# Patient Record
Sex: Male | Born: 1975
Health system: Southern US, Community
[De-identification: ages and names within clinical notes are randomized; demographics above are authoritative.]

## PROBLEM LIST (undated history)

## (undated) ENCOUNTER — Emergency Department (HOSPITAL_COMMUNITY): Discharge status: Absent without leave | Payer: PRIVATE HEALTH INSURANCE

## (undated) ENCOUNTER — Ambulatory Visit (HOSPITAL_COMMUNITY): Discharge status: Absent without leave | Payer: 59

## (undated) ENCOUNTER — Emergency Department (HOSPITAL_COMMUNITY): Admission: EM | Payer: PRIVATE HEALTH INSURANCE

## (undated) VITALS — BP 131/83 | Temp 98.5°F | Resp 20

## (undated) DIAGNOSIS — L309 Dermatitis, unspecified: Secondary | ICD-10-CM

## (undated) DIAGNOSIS — I10 Essential (primary) hypertension: Secondary | ICD-10-CM

## (undated) DIAGNOSIS — C801 Malignant (primary) neoplasm, unspecified: Secondary | ICD-10-CM

## (undated) DIAGNOSIS — K5790 Diverticulosis of intestine, part unspecified, without perforation or abscess without bleeding: Secondary | ICD-10-CM

## (undated) DIAGNOSIS — J069 Acute upper respiratory infection, unspecified: Secondary | ICD-10-CM

## (undated) DIAGNOSIS — N200 Calculus of kidney: Secondary | ICD-10-CM

## (undated) DIAGNOSIS — E119 Type 2 diabetes mellitus without complications: Secondary | ICD-10-CM

## (undated) DIAGNOSIS — F17200 Nicotine dependence, unspecified, uncomplicated: Secondary | ICD-10-CM

## (undated) DIAGNOSIS — K219 Gastro-esophageal reflux disease without esophagitis: Secondary | ICD-10-CM

## (undated) DIAGNOSIS — E118 Type 2 diabetes mellitus with unspecified complications: Secondary | ICD-10-CM

## (undated) DIAGNOSIS — D759 Disease of blood and blood-forming organs, unspecified: Secondary | ICD-10-CM

## (undated) DIAGNOSIS — E782 Mixed hyperlipidemia: Principal | ICD-10-CM

## (undated) HISTORY — DX: Nicotine dependence, unspecified, uncomplicated: F17.200

## (undated) HISTORY — DX: Dermatitis, unspecified: L30.9

## (undated) HISTORY — PX: UPPER GASTROINTESTINAL ENDOSCOPY: SHX188

## (undated) HISTORY — DX: Acute upper respiratory infection, unspecified: J06.9

## (undated) HISTORY — DX: Calculus of kidney: N20.0

## (undated) HISTORY — DX: Mixed hyperlipidemia: E78.2

## (undated) HISTORY — DX: Type 2 diabetes mellitus with unspecified complications: E11.8

## (undated) HISTORY — PX: GANGLION CYST EXCISION: SHX1691

## (undated) HISTORY — PX: ELBOW SURGERY: SHX618

## (undated) HISTORY — PX: COLONOSCOPY: SHX174

---

## 2015-11-08 LAB — HM COLONOSCOPY

## 2016-10-05 ENCOUNTER — Emergency Department (HOSPITAL_COMMUNITY): Payer: BLUE CROSS/BLUE SHIELD

## 2016-10-05 ENCOUNTER — Emergency Department (HOSPITAL_COMMUNITY)
Admission: EM | Admit: 2016-10-05 | Discharge: 2016-10-05 | Disposition: A | Discharge status: Home / Self Care | Payer: BLUE CROSS/BLUE SHIELD | Attending: Emergency Medicine | Admitting: Emergency Medicine

## 2016-10-05 ENCOUNTER — Encounter (HOSPITAL_COMMUNITY): Payer: Self-pay

## 2016-10-05 DIAGNOSIS — I10 Essential (primary) hypertension: Secondary | ICD-10-CM | POA: Diagnosis not present

## 2016-10-05 DIAGNOSIS — E119 Type 2 diabetes mellitus without complications: Secondary | ICD-10-CM | POA: Insufficient documentation

## 2016-10-05 DIAGNOSIS — F17210 Nicotine dependence, cigarettes, uncomplicated: Secondary | ICD-10-CM | POA: Insufficient documentation

## 2016-10-05 DIAGNOSIS — R1013 Epigastric pain: Secondary | ICD-10-CM | POA: Diagnosis present

## 2016-10-05 HISTORY — DX: Type 2 diabetes mellitus without complications: E11.9

## 2016-10-05 HISTORY — DX: Essential (primary) hypertension: I10

## 2016-10-05 LAB — COMPREHENSIVE METABOLIC PANEL
ALBUMIN: 4 g/dL (ref 3.5–5.0)
ALT: 43 U/L (ref 17–63)
ANION GAP: 10 (ref 5–15)
AST: 26 U/L (ref 15–41)
Alkaline Phosphatase: 97 U/L (ref 38–126)
BILIRUBIN TOTAL: 1.1 mg/dL (ref 0.3–1.2)
BUN: 10 mg/dL (ref 6–20)
CHLORIDE: 103 mmol/L (ref 101–111)
CO2: 25 mmol/L (ref 22–32)
Calcium: 8.9 mg/dL (ref 8.9–10.3)
Creatinine, Ser: 0.97 mg/dL (ref 0.61–1.24)
GFR calc Af Amer: >60 mL/min (ref >60)
Glucose, Bld: 186 mg/dL — ABNORMAL HIGH (ref 65–99)
Potassium: 2.9 mmol/L — ABNORMAL LOW (ref 3.5–5.1)
Sodium: 138 mmol/L (ref 135–145)
TOTAL PROTEIN: 6.9 g/dL (ref 6.5–8.1)

## 2016-10-05 LAB — CBC
HEMATOCRIT: 46.1 % (ref 39.0–52.0)
HEMOGLOBIN: 16.3 g/dL (ref 13.0–17.0)
MCH: 32.7 pg (ref 26.0–34.0)
MCHC: 35.4 g/dL (ref 30.0–36.0)
MCV: 92.6 fL (ref 78.0–100.0)
Platelets: 191 10*3/uL (ref 150–400)
RBC: 4.98 MIL/uL (ref 4.22–5.81)
RDW: 13 % (ref 11.5–15.5)
WBC: 10.4 10*3/uL (ref 4.0–10.5)

## 2016-10-05 LAB — LIPASE, BLOOD: LIPASE: 60 U/L — AB (ref 11–51)

## 2016-10-05 LAB — I-STAT TROPONIN, ED: Troponin i, poc: 0.01 ng/mL (ref 0.00–0.08)

## 2016-10-05 MED ORDER — RANITIDINE HCL 150 MG PO TABS: 150.0000 mg | ORAL_TABLET | Freq: Two times a day (BID) | ORAL | 0 refills | Status: DC

## 2016-10-05 NOTE — ED Notes (Signed)
Pt A&OX4, ambulatory at d/c with independent gait, NAD. This RN also apologized again for his long wait. Pt verbalized understanding of d/c instructions and follow up care.This RN offered to print patient a resource guide for local primary care providers within the community. Pt declined.

## 2016-10-05 NOTE — ED Notes (Signed)
Pt verbalized he was upset that he had to wait in waiting room for 4 hours. This RN apologized for his long wait but explained to patient the process of acuity and that people are taken back to rooms based on acuity, etc. Pt stated he would like to just leave because his pain is gone and that "ill just go to another ER if it comes back." Pt encouraged to stay to see the EDP but he states he does not want to wait "another 2 hours." Pt then wanted a work note but this RN explained to patient this RN would not provide him with a work note without being seen and cleared by a provider. Pt stated he would tjhen wait to see EDP. This RN went directly to Dr. Lacinda Axon to update him and Dr. Lacinda Axon went directly into patients room to talk to patient.

## 2016-10-05 NOTE — ED Triage Notes (Signed)
Pt states he has had epigastric/chest pain for several months. He describes the area of pain directly below the xyphoid process. Pt denies an associated symptoms. Pt states he has had a colonoscopy due to the pain with no answers. Skin warm and dry, no acute distress noted.

## 2016-10-05 NOTE — Discharge Instructions (Signed)
Recommend discontinuation of alcohol. Try new medication. You'll need to get a primary care doctor in [place].

## 2016-10-05 NOTE — ED Provider Notes (Signed)
[facility] DEPT Provider Note   CSN: [id number] Arrival date & time: 10/05/16  1309     History   Chief Complaint Chief Complaint  Patient presents with  . Abdominal Pain    HPI Eric Hooper is a 41 y.o. male.  Patient presents with epigastric pain for the last 8 months. He claims to have had a normal EGD and colonoscopy recently. No fever, sweats, chills, vomiting, diarrhea, chest pain, dyspnea, jaundice, weight loss seen. Past medical history includes diabetes and hypertension. Pain is mild and not associated with any activity.      Past Medical History:  Diagnosis Date  . Diabetes mellitus without complication (HCC)    non-insulin dependent  . Hypertension     There are no active problems to display for this patient.   History reviewed. No pertinent surgical history.     Home Medications    Prior to Admission medications   Medication Sig Start Date End Date Taking? Authorizing Provider  ranitidine (ZANTAC) 150 MG tablet Take 1 tablet (150 mg total) by mouth 2 (two) times daily. 10/05/16   Nat Christen, MD    Family History History reviewed. No pertinent family history.  Social History Social History  Substance Use Topics  . Smoking status: Current Every Day Smoker    Packs/day: 0.50    Types: Cigarettes  . Smokeless tobacco: Never Used  . Alcohol use Yes     Comment: socially     Allergies   Patient has no known allergies.   Review of Systems Review of Systems  All other systems reviewed and are negative.    Physical Exam Updated Vital Signs BP (!) 126/91 (BP Location: Left Arm)   Pulse 83   Temp 97.9 F (36.6 C) (Oral)   Resp 16   Ht 5\' 10"  (1.778 m)   Wt 79.4 kg (175 lb)   SpO2 97%   BMI 25.11 kg/m   Physical Exam  Constitutional: He is oriented to person, place, and time. He appears well-developed and well-nourished.  NAD  HENT:  Head: Normocephalic and atraumatic.  Eyes: Conjunctivae are normal.  Neck: Neck supple.    Cardiovascular: Normal rate and regular rhythm.   Pulmonary/Chest: Effort normal and breath sounds normal.  Abdominal: Soft. Bowel sounds are normal.  Musculoskeletal: Normal range of motion.  Neurological: He is alert and oriented to person, place, and time.  Skin: Skin is warm and dry.  Psychiatric: He has a normal mood and affect. His behavior is normal.  Nursing note and vitals reviewed.    ED Treatments / Results  Labs (all labs ordered are listed, but only abnormal results are displayed) Labs Reviewed  LIPASE, BLOOD - Abnormal; Notable for the following:       Result Value   Lipase 60 (*)    All other components within normal limits  COMPREHENSIVE METABOLIC PANEL - Abnormal; Notable for the following:    Potassium 2.9 (*)    Glucose, Bld 186 (*)    All other components within normal limits  CBC  URINALYSIS, ROUTINE W REFLEX MICROSCOPIC  I-STAT TROPOININ, ED    EKG  EKG Interpretation  Date/Time:  Monday October 05 2016 13:44:58 EDT Ventricular Rate:  72 PR Interval:  132 QRS Duration: 104 QT Interval:  426 QTC Calculation: 466 R Axis:   89 Text Interpretation:  Normal sinus rhythm T wave abnormality, consider inferior ischemia Prolonged QT Abnormal ECG No previous tracing Confirmed by Dorie Rank [id number]) on 10/05/2016 4:11:53 PM  Radiology Dg Chest 2 View  Result Date: 10/05/2016 CLINICAL DATA:  Chronic chest pain EXAM: CHEST  2 VIEW COMPARISON:  None. FINDINGS: Lungs are clear. Heart size and pulmonary vascularity are normal. No adenopathy. No pneumothorax. No bone lesions. IMPRESSION: No edema or consolidation. Electronically Signed   By: Lowella Grip III M.D.   On: 10/05/2016 15:02    Procedures Procedures (including critical care time)  Medications Ordered in ED Medications - No data to display   Initial Impression / Assessment and Plan / ED Course  I have reviewed the triage vital signs and the nursing notes.  Pertinent labs & imaging  results that were available during my care of the patient were reviewed by me and considered in my medical decision making (see chart for details).  History and physical suggestive of dyspepsia or GERD. Lipase minimally elevated. Potassium slightly low. Will prescribe Zantac 150 mg bid.  He will get primary care follow-up. May need gallbladder ultrasound if symptoms do not improve.    Final Clinical Impressions(s) / ED Diagnoses   Final diagnoses:  Epigastric pain    New Prescriptions New Prescriptions   RANITIDINE (ZANTAC) 150 MG TABLET    Take 1 tablet (150 mg total) by mouth 2 (two) times daily.     Nat Christen, MD 10/12/16 1124

## 2016-12-22 ENCOUNTER — Encounter (HOSPITAL_COMMUNITY): Payer: Self-pay | Admitting: Emergency Medicine

## 2016-12-22 ENCOUNTER — Emergency Department (HOSPITAL_COMMUNITY)
Admission: EM | Admit: 2016-12-22 | Discharge: 2016-12-22 | Disposition: A | Discharge status: Home / Self Care | Payer: BLUE CROSS/BLUE SHIELD | Attending: Emergency Medicine | Admitting: Emergency Medicine

## 2016-12-22 DIAGNOSIS — F17210 Nicotine dependence, cigarettes, uncomplicated: Secondary | ICD-10-CM | POA: Insufficient documentation

## 2016-12-22 DIAGNOSIS — E119 Type 2 diabetes mellitus without complications: Secondary | ICD-10-CM | POA: Diagnosis not present

## 2016-12-22 DIAGNOSIS — R1013 Epigastric pain: Secondary | ICD-10-CM | POA: Diagnosis present

## 2016-12-22 DIAGNOSIS — R062 Wheezing: Secondary | ICD-10-CM

## 2016-12-22 DIAGNOSIS — I10 Essential (primary) hypertension: Secondary | ICD-10-CM | POA: Insufficient documentation

## 2016-12-22 DIAGNOSIS — M545 Low back pain, unspecified: Secondary | ICD-10-CM

## 2016-12-22 DIAGNOSIS — K2950 Unspecified chronic gastritis without bleeding: Secondary | ICD-10-CM | POA: Diagnosis not present

## 2016-12-22 LAB — COMPREHENSIVE METABOLIC PANEL
ALT: 52 U/L (ref 17–63)
AST: 39 U/L (ref 15–41)
Albumin: 4.3 g/dL (ref 3.5–5.0)
Alkaline Phosphatase: 111 U/L (ref 38–126)
Anion gap: 9 (ref 5–15)
BUN: 9 mg/dL (ref 6–20)
CHLORIDE: 104 mmol/L (ref 101–111)
CO2: 27 mmol/L (ref 22–32)
CREATININE: 1.07 mg/dL (ref 0.61–1.24)
Calcium: 9.6 mg/dL (ref 8.9–10.3)
GFR calc Af Amer: >60 mL/min (ref >60)
Glucose, Bld: 134 mg/dL — ABNORMAL HIGH (ref 65–99)
Potassium: 3.3 mmol/L — ABNORMAL LOW (ref 3.5–5.1)
Sodium: 140 mmol/L (ref 135–145)
Total Bilirubin: 1.1 mg/dL (ref 0.3–1.2)
Total Protein: 7.2 g/dL (ref 6.5–8.1)

## 2016-12-22 LAB — CBC
HCT: 44.9 % (ref 39.0–52.0)
Hemoglobin: 15.7 g/dL (ref 13.0–17.0)
MCH: 32.9 pg (ref 26.0–34.0)
MCHC: 35 g/dL (ref 30.0–36.0)
MCV: 94.1 fL (ref 78.0–100.0)
Platelets: 143 10*3/uL — ABNORMAL LOW (ref 150–400)
RBC: 4.77 MIL/uL (ref 4.22–5.81)
RDW: 13.2 % (ref 11.5–15.5)
WBC: 9.5 10*3/uL (ref 4.0–10.5)

## 2016-12-22 LAB — URINALYSIS, ROUTINE W REFLEX MICROSCOPIC
Bilirubin Urine: NEGATIVE
Glucose, UA: NEGATIVE mg/dL
Hgb urine dipstick: NEGATIVE
Ketones, ur: NEGATIVE mg/dL
Leukocytes, UA: NEGATIVE
Nitrite: NEGATIVE
Protein, ur: 30 mg/dL — AB
SPECIFIC GRAVITY, URINE: 1.02 (ref 1.005–1.030)
pH: 5 (ref 5.0–8.0)

## 2016-12-22 LAB — LIPASE, BLOOD: LIPASE: 46 U/L (ref 11–51)

## 2016-12-22 MED ORDER — ESOMEPRAZOLE MAGNESIUM 40 MG PO CPDR: 40.0000 mg | DELAYED_RELEASE_CAPSULE | Freq: Every day | ORAL | 0 refills | Status: DC

## 2016-12-22 MED ORDER — FLUTICASONE PROPIONATE 50 MCG/ACT NA SUSP: 2.0000 | Freq: Every day | NASAL | 2 refills | Status: DC

## 2016-12-22 MED ORDER — ALBUTEROL SULFATE HFA 108 (90 BASE) MCG/ACT IN AERS
2.0000 | INHALATION_SPRAY | RESPIRATORY_TRACT | Status: DC
  Administered 2016-12-22: 2 via RESPIRATORY_TRACT
  Filled 2016-12-22: qty 6.7

## 2016-12-22 NOTE — ED Triage Notes (Signed)
Pt states he has been having abd pain for a year. Pt also complains of left flank pain today. Denies urinary symptoms. Pt states he also works around a lot of dust and has been coughing at times and feels like he wheezes sometimes. No CP or SOB at this time.

## 2016-12-22 NOTE — ED Provider Notes (Signed)
[facility] DEPT Provider Note   CSN: [id number] Arrival date & time: 12/22/16  1223     History   Chief Complaint Chief Complaint  Patient presents with  . Abdominal Pain  . Flank Pain  . Cough    HPI Eric Hooper is a 41 y.o. male.  HPI Eric Hooper is a 41 y.o. male with history of hypertension, diabetes, presents to emergency department with multiple complaints. Patient states that he is been having epigastric abdominal pain for almost a year. He states pain comes and goes. It is worse in the morning. He states at times it is so severe that he is doubled over and crying in pain. Sometimes it is mild. It is not associated with eating. It is not associated with exertion. He states that he was seen for the same in [place] he is from about a year ago and had an endoscopy and colonoscopy performed patient states that he was told everything was okay but was started on Nexium. He states he currently unable to afford Nexium. Patient also admits to a diet that is high in spicy and tomato based foods. He states "I married to a [place], what do you think." Patient also reports drinking alcohol daily, he states he drinks 2-3 beers every night. He takes ibuprofen daily for his back and abdominal pain. He smokes cigarettes. Drinks coffee in the morning. Denies any change in his stools. Denies any black or tarry stools. Denies any nausea or vomiting. Patient's other complaint is wheezing. He states he works around would dust and states he is allergic to trees. He reports postnasal drainage, constant nasal congestion, wheezing which is worse after work. He states he wears a mask at work "sometimes." He has not tried any medications for his wheezing. He denies any fever or chills. He denies any chest pain. He denies any productive sputum. His last complaint is lower back pain. He states he does a lot of heavy lifting and in the last several days noticed pain in the left lower back when  lifting heavy objects. Pain does not radiate. Pain is worsened with movements. States "I just want to make sure my kidneys okay." He denies any urinary symptoms. No hematuria. Resting makes pain better. Nothing makes it worse.  Past Medical History:  Diagnosis Date  . Diabetes mellitus without complication (HCC)    non-insulin dependent  . Hypertension     There are no active problems to display for this patient.   History reviewed. No pertinent surgical history.     Home Medications    Prior to Admission medications   Medication Sig Start Date End Date Taking? Authorizing Provider  ranitidine (ZANTAC) 150 MG tablet Take 1 tablet (150 mg total) by mouth 2 (two) times daily. 10/05/16   Nat Christen, MD    Family History History reviewed. No pertinent family history.  Social History Social History  Substance Use Topics  . Smoking status: Current Every Day Smoker    Packs/day: 0.50    Types: Cigarettes  . Smokeless tobacco: Never Used  . Alcohol use Yes     Comment: socially     Allergies   Patient has no known allergies.   Review of Systems Review of Systems  Constitutional: Negative for chills and fever.  Respiratory: Negative for cough, chest tightness and shortness of breath.   Cardiovascular: Negative for chest pain, palpitations and leg swelling.  Gastrointestinal: Positive for abdominal pain. Negative for abdominal distention, blood in stool,  diarrhea, nausea and vomiting.  Genitourinary: Negative for dysuria, frequency, hematuria and urgency.  Musculoskeletal: Positive for back pain. Negative for arthralgias, myalgias, neck pain and neck stiffness.  Skin: Negative for rash.  Allergic/Immunologic: Negative for immunocompromised state.  Neurological: Negative for dizziness, weakness, light-headedness, numbness and headaches.  All other systems reviewed and are negative.    Physical Exam Updated Vital Signs BP 134/87   Pulse 72   Temp 98.1 F (36.7 C)  (Oral)   Resp 18   Ht 5\' 10"  (1.778 m)   Wt 87.5 kg (193 lb)   SpO2 100%   BMI 27.69 kg/m   Physical Exam  Constitutional: He is oriented to person, place, and time. He appears well-developed and well-nourished. No distress.  HENT:  Head: Normocephalic and atraumatic.  Right Ear: External ear normal.  Left Ear: External ear normal.  Nose: Nose normal.  Mouth/Throat: Oropharynx is clear and moist.  Eyes: Conjunctivae are normal.  Neck: Neck supple.  Cardiovascular: Normal rate, regular rhythm and normal heart sounds.   Pulmonary/Chest: Effort normal. No respiratory distress. He has no wheezes. He has no rales. He exhibits no tenderness.  Abdominal: Soft. Bowel sounds are normal. He exhibits no distension. There is no tenderness. There is no rebound.  Musculoskeletal: He exhibits no edema.  Tenderness to palpation in the left lower paraspinal muscles. No midline tenderness. No tenderness in SI joint. No pain with straight leg raise.  Neurological: He is alert and oriented to person, place, and time.  Skin: Skin is warm and dry.  Nursing note and vitals reviewed.    ED Treatments / Results  Labs (all labs ordered are listed, but only abnormal results are displayed) Labs Reviewed  COMPREHENSIVE METABOLIC PANEL - Abnormal; Notable for the following:       Result Value   Potassium 3.3 (*)    Glucose, Bld 134 (*)    All other components within normal limits  CBC - Abnormal; Notable for the following:    Platelets 143 (*)    All other components within normal limits  URINALYSIS, ROUTINE W REFLEX MICROSCOPIC - Abnormal; Notable for the following:    Protein, ur 30 (*)    Bacteria, UA RARE (*)    Squamous Epithelial / LPF 0-5 (*)    All other components within normal limits  LIPASE, BLOOD    EKG  EKG Interpretation None       Radiology No results found.  Procedures Procedures (including critical care time)  Medications Ordered in ED Medications - No data to  display   Initial Impression / Assessment and Plan / ED Course  I have reviewed the triage vital signs and the nursing notes.  Pertinent labs & imaging results that were available during my care of the patient were reviewed by me and considered in my medical decision making (see chart for details).      Patient is in no acute distress. Complaining of abdominal pain for a year, comes and goes, worse in the morning, pain is only in epigastric area. Patient has multiple risk factors for gastritis or even peptic ulcers. He drinks alcohol daily, smokes, takes NSAIDs daily, and consumes spicy foods daily as well as caffeinated beverages. We discussed at length stopping all those things. His abdomen is benign, nontender at this time. His hemoglobin is normal. I do not think he needs any further workup or imaging in emergency department. Patient does have an appointment with family doctor in one week. He is also  convinced that this abdominal pain may be coming from taking his metformin. His blood sugar is well controlled here today. We discussed stopping metformin and watching his diet carefully. I will have his family doctor switch his medications. As far as his back pain, suspect this is musculoskeletal. Advised to stop heavy lifting, continue Tylenol for pain, try massage and stretching.  While patient was getting discharge, he asked for antibiotic for his "sinus infection." Again and explained that I do not think he has an acute sinus infection, especially not bacterial, suspect he does have allergies, will give Flonase and advised to take allergy medication.  Vitals:   12/22/16 1226 12/22/16 1229 12/22/16 1435  BP:  (!) 158/104 134/87  Pulse:  80 72  Resp:  15 18  Temp:  98.5 F (36.9 C) 98.1 F (36.7 C)  TempSrc:  Oral Oral  SpO2:  100% 100%  Weight: 87.5 kg (193 lb)    Height: 5\' 10"  (1.778 m)       Final diagnoses:  Chronic gastritis without bleeding, unspecified gastritis type    Wheezing  Acute left-sided low back pain without sciatica    New Prescriptions New Prescriptions   ESOMEPRAZOLE (NEXIUM) 40 MG CAPSULE    Take 1 capsule (40 mg total) by mouth daily.   FLUTICASONE (FLONASE) 50 MCG/ACT NASAL SPRAY    Place 2 sprays into both nostrils daily.     Jeannett Senior, PA-C 12/22/16 1817    Duffy Bruce, MD 12/23/16 [phone]

## 2016-12-22 NOTE — Discharge Instructions (Signed)
Avoid any medications containing ibuprofen, Aleve, naproxen. Take Tylenol for pain as needed. Avoid any spicy or tomato based foods. Cut down on alcohol. Cut down on smoking. Restart Nexium if possible. Try [email or web address] for cupons. Use inhaler 2 puffs every 4 hours for wheezing as needed. Start taking allergy medication daily such as Zyrtec or Claritin. Stop metformin, just make sure to watch her diet closely. Follow-up with your family doctor scheduled.

## 2017-02-01 ENCOUNTER — Emergency Department (HOSPITAL_COMMUNITY): Payer: BLUE CROSS/BLUE SHIELD

## 2017-02-01 ENCOUNTER — Encounter (HOSPITAL_COMMUNITY): Payer: Self-pay | Admitting: Radiology

## 2017-02-01 ENCOUNTER — Inpatient Hospital Stay (HOSPITAL_COMMUNITY)
Admission: EM | Admit: 2017-02-01 | Discharge: 2017-02-07 | DRG: 546 | Disposition: A | Discharge status: Home / Self Care | Payer: BLUE CROSS/BLUE SHIELD | Attending: Internal Medicine | Admitting: Internal Medicine

## 2017-02-01 DIAGNOSIS — D649 Anemia, unspecified: Secondary | ICD-10-CM

## 2017-02-01 DIAGNOSIS — Z7289 Other problems related to lifestyle: Secondary | ICD-10-CM | POA: Diagnosis not present

## 2017-02-01 DIAGNOSIS — I129 Hypertensive chronic kidney disease with stage 1 through stage 4 chronic kidney disease, or unspecified chronic kidney disease: Secondary | ICD-10-CM | POA: Diagnosis present

## 2017-02-01 DIAGNOSIS — E1165 Type 2 diabetes mellitus with hyperglycemia: Secondary | ICD-10-CM | POA: Diagnosis present

## 2017-02-01 DIAGNOSIS — E876 Hypokalemia: Secondary | ICD-10-CM

## 2017-02-01 DIAGNOSIS — M898X8 Other specified disorders of bone, other site: Secondary | ICD-10-CM | POA: Diagnosis present

## 2017-02-01 DIAGNOSIS — Z789 Other specified health status: Secondary | ICD-10-CM

## 2017-02-01 DIAGNOSIS — R072 Precordial pain: Secondary | ICD-10-CM | POA: Diagnosis not present

## 2017-02-01 DIAGNOSIS — R1013 Epigastric pain: Secondary | ICD-10-CM | POA: Diagnosis present

## 2017-02-01 DIAGNOSIS — Z9111 Patient's noncompliance with dietary regimen: Secondary | ICD-10-CM | POA: Diagnosis not present

## 2017-02-01 DIAGNOSIS — D539 Nutritional anemia, unspecified: Secondary | ICD-10-CM

## 2017-02-01 DIAGNOSIS — N182 Chronic kidney disease, stage 2 (mild): Secondary | ICD-10-CM | POA: Diagnosis present

## 2017-02-01 DIAGNOSIS — D589 Hereditary hemolytic anemia, unspecified: Secondary | ICD-10-CM

## 2017-02-01 DIAGNOSIS — K5790 Diverticulosis of intestine, part unspecified, without perforation or abscess without bleeding: Secondary | ICD-10-CM | POA: Insufficient documentation

## 2017-02-01 DIAGNOSIS — R3129 Other microscopic hematuria: Secondary | ICD-10-CM

## 2017-02-01 DIAGNOSIS — Z803 Family history of malignant neoplasm of breast: Secondary | ICD-10-CM

## 2017-02-01 DIAGNOSIS — D696 Thrombocytopenia, unspecified: Secondary | ICD-10-CM | POA: Diagnosis not present

## 2017-02-01 DIAGNOSIS — R1010 Upper abdominal pain, unspecified: Secondary | ICD-10-CM

## 2017-02-01 DIAGNOSIS — N289 Disorder of kidney and ureter, unspecified: Secondary | ICD-10-CM | POA: Diagnosis present

## 2017-02-01 DIAGNOSIS — R05 Cough: Secondary | ICD-10-CM | POA: Diagnosis not present

## 2017-02-01 DIAGNOSIS — M311 Thrombotic microangiopathy: Secondary | ICD-10-CM | POA: Diagnosis present

## 2017-02-01 DIAGNOSIS — Z720 Tobacco use: Secondary | ICD-10-CM | POA: Diagnosis not present

## 2017-02-01 DIAGNOSIS — N179 Acute kidney failure, unspecified: Secondary | ICD-10-CM | POA: Diagnosis present

## 2017-02-01 DIAGNOSIS — L299 Pruritus, unspecified: Secondary | ICD-10-CM | POA: Diagnosis not present

## 2017-02-01 DIAGNOSIS — K219 Gastro-esophageal reflux disease without esophagitis: Secondary | ICD-10-CM | POA: Diagnosis present

## 2017-02-01 DIAGNOSIS — G8929 Other chronic pain: Secondary | ICD-10-CM | POA: Diagnosis present

## 2017-02-01 DIAGNOSIS — E119 Type 2 diabetes mellitus without complications: Secondary | ICD-10-CM

## 2017-02-01 DIAGNOSIS — T402X5A Adverse effect of other opioids, initial encounter: Secondary | ICD-10-CM | POA: Diagnosis not present

## 2017-02-01 DIAGNOSIS — R739 Hyperglycemia, unspecified: Secondary | ICD-10-CM | POA: Diagnosis not present

## 2017-02-01 DIAGNOSIS — M3119 Other thrombotic microangiopathy: Secondary | ICD-10-CM

## 2017-02-01 DIAGNOSIS — D72829 Elevated white blood cell count, unspecified: Secondary | ICD-10-CM | POA: Diagnosis present

## 2017-02-01 DIAGNOSIS — E1122 Type 2 diabetes mellitus with diabetic chronic kidney disease: Secondary | ICD-10-CM | POA: Diagnosis present

## 2017-02-01 DIAGNOSIS — R059 Cough, unspecified: Secondary | ICD-10-CM

## 2017-02-01 DIAGNOSIS — F17210 Nicotine dependence, cigarettes, uncomplicated: Secondary | ICD-10-CM | POA: Diagnosis present

## 2017-02-01 DIAGNOSIS — K2920 Alcoholic gastritis without bleeding: Secondary | ICD-10-CM | POA: Diagnosis present

## 2017-02-01 DIAGNOSIS — I10 Essential (primary) hypertension: Secondary | ICD-10-CM | POA: Diagnosis not present

## 2017-02-01 HISTORY — DX: Gastro-esophageal reflux disease without esophagitis: K21.9

## 2017-02-01 HISTORY — DX: Diverticulosis of intestine, part unspecified, without perforation or abscess without bleeding: K57.90

## 2017-02-01 HISTORY — DX: Other microscopic hematuria: R31.29

## 2017-02-01 HISTORY — DX: Type 2 diabetes mellitus without complications: E11.9

## 2017-02-01 HISTORY — DX: Nutritional anemia, unspecified: D53.9

## 2017-02-01 LAB — COMPREHENSIVE METABOLIC PANEL
ALBUMIN: 4 g/dL (ref 3.5–5.0)
ALT: 31 U/L (ref 17–63)
AST: 52 U/L — ABNORMAL HIGH (ref 15–41)
Alkaline Phosphatase: 99 U/L (ref 38–126)
Anion gap: 8 (ref 5–15)
BUN: 19 mg/dL (ref 6–20)
CHLORIDE: 105 mmol/L (ref 101–111)
CO2: 27 mmol/L (ref 22–32)
CREATININE: 1.25 mg/dL — AB (ref 0.61–1.24)
Calcium: 9.7 mg/dL (ref 8.9–10.3)
GFR calc Af Amer: >60 mL/min (ref >60)
GFR calc non Af Amer: >60 mL/min (ref >60)
GLUCOSE: 112 mg/dL — AB (ref 65–99)
POTASSIUM: 3.1 mmol/L — AB (ref 3.5–5.1)
SODIUM: 140 mmol/L (ref 135–145)
Total Bilirubin: 3.9 mg/dL — ABNORMAL HIGH (ref 0.3–1.2)
Total Protein: 6.4 g/dL — ABNORMAL LOW (ref 6.5–8.1)

## 2017-02-01 LAB — URINALYSIS, ROUTINE W REFLEX MICROSCOPIC
Bilirubin Urine: NEGATIVE
Glucose, UA: NEGATIVE mg/dL
Ketones, ur: NEGATIVE mg/dL
LEUKOCYTES UA: NEGATIVE
Nitrite: NEGATIVE
Specific Gravity, Urine: 1.027 (ref 1.005–1.030)
pH: 5 (ref 5.0–8.0)

## 2017-02-01 LAB — BILIRUBIN, FRACTIONATED(TOT/DIR/INDIR)
BILIRUBIN DIRECT: 0.4 mg/dL (ref 0.1–0.5)
BILIRUBIN INDIRECT: 3.7 mg/dL — AB (ref 0.3–0.9)
Total Bilirubin: 4.1 mg/dL — ABNORMAL HIGH (ref 0.3–1.2)

## 2017-02-01 LAB — RAPID URINE DRUG SCREEN, HOSP PERFORMED
Amphetamines: NOT DETECTED
Barbiturates: NOT DETECTED
Benzodiazepines: NOT DETECTED
COCAINE: NOT DETECTED
OPIATES: NOT DETECTED
TETRAHYDROCANNABINOL: NOT DETECTED

## 2017-02-01 LAB — CBC
HEMATOCRIT: 32.3 % — AB (ref 39.0–52.0)
Hemoglobin: 10.9 g/dL — ABNORMAL LOW (ref 13.0–17.0)
MCH: 35.7 pg — AB (ref 26.0–34.0)
MCHC: 33.7 g/dL (ref 30.0–36.0)
MCV: 105.9 fL — AB (ref 78.0–100.0)
PLATELETS: 23 10*3/uL — AB (ref 150–400)
RBC: 3.05 MIL/uL — ABNORMAL LOW (ref 4.22–5.81)
RDW: 19.8 % — AB (ref 11.5–15.5)
WBC: 8.6 10*3/uL (ref 4.0–10.5)

## 2017-02-01 LAB — SAVE SMEAR

## 2017-02-01 LAB — GLUCOSE, CAPILLARY: GLUCOSE-CAPILLARY: 120 mg/dL — AB (ref 65–99)

## 2017-02-01 LAB — POC OCCULT BLOOD, ED: Fecal Occult Bld: NEGATIVE

## 2017-02-01 LAB — PLATELET COUNT: PLATELETS: 24 10*3/uL — AB (ref 150–400)

## 2017-02-01 LAB — RETICULOCYTES
RBC.: 2.99 MIL/uL — ABNORMAL LOW (ref 4.22–5.81)
RETIC COUNT ABSOLUTE: 334.9 10*3/uL — AB (ref 19.0–186.0)
Retic Ct Pct: 11.2 % — ABNORMAL HIGH (ref 0.4–3.1)

## 2017-02-01 LAB — PROTIME-INR
INR: 1.1
Prothrombin Time: 14.2 seconds (ref 11.4–15.2)

## 2017-02-01 LAB — LACTATE DEHYDROGENASE: LDH: 1010 U/L — AB (ref 98–192)

## 2017-02-01 LAB — LIPASE, BLOOD: LIPASE: 69 U/L — AB (ref 11–51)

## 2017-02-01 LAB — FIBRINOGEN: Fibrinogen: 243 mg/dL (ref 210–475)

## 2017-02-01 MED ORDER — ADULT MULTIVITAMIN W/MINERALS CH
1.0000 | ORAL_TABLET | Freq: Every day | ORAL | Status: DC
  Administered 2017-02-03 – 2017-02-07 (×5): 1 via ORAL
  Filled 2017-02-01 (×5): qty 1

## 2017-02-01 MED ORDER — ACETAMINOPHEN 650 MG RE SUPP: 650.0000 mg | Freq: Four times a day (QID) | RECTAL | Status: DC | PRN

## 2017-02-01 MED ORDER — SODIUM CHLORIDE 0.9 % IV BOLUS (SEPSIS)
500.0000 mL | Freq: Once | INTRAVENOUS | Status: AC
  Administered 2017-02-01: 500 mL via INTRAVENOUS

## 2017-02-01 MED ORDER — METHYLPREDNISOLONE SODIUM SUCC 125 MG IJ SOLR
1.0000 mg/kg | Freq: Every day | INTRAMUSCULAR | Status: DC
  Administered 2017-02-02 – 2017-02-04 (×3): 90.625 mg via INTRAVENOUS
  Filled 2017-02-01 (×4): qty 2

## 2017-02-01 MED ORDER — LORAZEPAM 2 MG/ML IJ SOLN: 0.0000 mg | Freq: Two times a day (BID) | INTRAMUSCULAR | Status: AC

## 2017-02-01 MED ORDER — SODIUM CHLORIDE 0.9 % IV BOLUS (SEPSIS): 1000.0000 mL | Freq: Once | INTRAVENOUS | Status: DC

## 2017-02-01 MED ORDER — THIAMINE HCL 100 MG/ML IJ SOLN
100.0000 mg | Freq: Every day | INTRAMUSCULAR | Status: DC
  Administered 2017-02-02: 100 mg via INTRAVENOUS
  Filled 2017-02-01: qty 2

## 2017-02-01 MED ORDER — LORAZEPAM 2 MG/ML IJ SOLN: 1.0000 mg | Freq: Four times a day (QID) | INTRAMUSCULAR | Status: AC | PRN

## 2017-02-01 MED ORDER — CLONIDINE HCL 0.1 MG PO TABS: 0.1000 mg | ORAL_TABLET | Freq: Four times a day (QID) | ORAL | Status: DC | PRN

## 2017-02-01 MED ORDER — ONDANSETRON HCL 4 MG/2ML IJ SOLN
4.0000 mg | Freq: Four times a day (QID) | INTRAMUSCULAR | Status: DC | PRN
  Administered 2017-02-01: 4 mg via INTRAVENOUS
  Filled 2017-02-01: qty 2

## 2017-02-01 MED ORDER — POTASSIUM CHLORIDE IN NACL 40-0.9 MEQ/L-% IV SOLN
INTRAVENOUS | Status: AC
  Administered 2017-02-02: 75 mL/h via INTRAVENOUS
  Filled 2017-02-01 (×3): qty 1000

## 2017-02-01 MED ORDER — ALBUTEROL SULFATE (2.5 MG/3ML) 0.083% IN NEBU: 3.0000 mL | INHALATION_SOLUTION | Freq: Four times a day (QID) | RESPIRATORY_TRACT | Status: DC | PRN

## 2017-02-01 MED ORDER — HYDROMORPHONE HCL 1 MG/ML IJ SOLN
1.0000 mg | INTRAMUSCULAR | Status: DC | PRN
  Administered 2017-02-01 – 2017-02-02 (×3): 1 mg via INTRAVENOUS
  Filled 2017-02-01 (×3): qty 1

## 2017-02-01 MED ORDER — ACETAMINOPHEN 325 MG PO TABS: 650.0000 mg | ORAL_TABLET | Freq: Four times a day (QID) | ORAL | Status: DC | PRN

## 2017-02-01 MED ORDER — FOLIC ACID 1 MG PO TABS
1.0000 mg | ORAL_TABLET | Freq: Every day | ORAL | Status: DC
  Administered 2017-02-02 – 2017-02-07 (×6): 1 mg via ORAL
  Filled 2017-02-01 (×6): qty 1

## 2017-02-01 MED ORDER — LORAZEPAM 1 MG PO TABS
1.0000 mg | ORAL_TABLET | Freq: Four times a day (QID) | ORAL | Status: AC | PRN
  Administered 2017-02-02 – 2017-02-04 (×3): 1 mg via ORAL
  Filled 2017-02-01 (×3): qty 1

## 2017-02-01 MED ORDER — AMLODIPINE BESYLATE 5 MG PO TABS
5.0000 mg | ORAL_TABLET | Freq: Two times a day (BID) | ORAL | Status: DC
  Administered 2017-02-02 – 2017-02-07 (×11): 5 mg via ORAL
  Filled 2017-02-01 (×12): qty 1

## 2017-02-01 MED ORDER — PANTOPRAZOLE SODIUM 40 MG PO TBEC
40.0000 mg | DELAYED_RELEASE_TABLET | Freq: Every day | ORAL | Status: DC
  Administered 2017-02-02 – 2017-02-07 (×6): 40 mg via ORAL
  Filled 2017-02-01 (×6): qty 1

## 2017-02-01 MED ORDER — INSULIN ASPART 100 UNIT/ML ~~LOC~~ SOLN
0.0000 [IU] | Freq: Three times a day (TID) | SUBCUTANEOUS | Status: DC
  Administered 2017-02-02: 7 [IU] via SUBCUTANEOUS
  Administered 2017-02-03: 1 [IU] via SUBCUTANEOUS
  Administered 2017-02-03: 5 [IU] via SUBCUTANEOUS
  Administered 2017-02-04: 2 [IU] via SUBCUTANEOUS

## 2017-02-01 MED ORDER — IOPAMIDOL (ISOVUE-300) INJECTION 61%
INTRAVENOUS | Status: AC
  Administered 2017-02-01: 100 mL
  Filled 2017-02-01: qty 100

## 2017-02-01 MED ORDER — GI COCKTAIL ~~LOC~~
30.0000 mL | Freq: Once | ORAL | Status: AC
  Administered 2017-02-01: 30 mL via ORAL
  Filled 2017-02-01: qty 30

## 2017-02-01 MED ORDER — LORAZEPAM 2 MG/ML IJ SOLN
0.0000 mg | Freq: Four times a day (QID) | INTRAMUSCULAR | Status: AC
Start: 2017-02-02 — End: 2017-02-03

## 2017-02-01 MED ORDER — VITAMIN B-1 100 MG PO TABS
100.0000 mg | ORAL_TABLET | Freq: Every day | ORAL | Status: DC
  Administered 2017-02-03 – 2017-02-05 (×2): 100 mg via ORAL
  Filled 2017-02-01 (×5): qty 1

## 2017-02-01 NOTE — ED Notes (Signed)
On way to CT 

## 2017-02-01 NOTE — ED Triage Notes (Signed)
Pt reports several month hx of upper abdominal pain. States seen here for same. Pt reports pain across top of abdomen and mid back. Pt with jaundiced appearance.

## 2017-02-01 NOTE — ED Provider Notes (Signed)
[facility] DEPT Provider Note   CSN: [id number] Arrival date & time: 02/01/17  1237     History   Chief Complaint Chief Complaint  Patient presents with  . Abdominal Pain    HPI Liliana Knaak is a 41 y.o. male.  Isham Maes is a 41 y.o. Male who presents to the emergency department complaining of upper abdominal pain ongoing for the past 2 years. Patient reports his pain is epigastric and right upper quadrant of his abdomen ongoing for the past 2 years that is gradually worsened. He reports his pain seems worse when lying flat. He denies any current nausea, vomiting or diarrhea. He does report he drinks every day and usually at least 6-12 beers a day. He denies history of pancreatitis. He reports taking 1200 mg of ibuprofen twice a day for his abdominal pain at home. He denies previous abdominal surgeries. He is a smoker. He denies fevers, nausea, vomiting, diarrhea, urinary symptoms, chest pain, shortness of breath, rashes.    The history is provided by the patient, medical records and the spouse. No language interpreter was used.  Abdominal Pain   Pertinent negatives include fever, diarrhea, nausea, vomiting, dysuria and headaches.    Past Medical History:  Diagnosis Date  . Diabetes mellitus without complication (HCC)    non-insulin dependent  . Hypertension     There are no active problems to display for this patient.   No past surgical history on file.     Home Medications    Prior to Admission medications   Medication Sig Start Date End Date Taking? Authorizing Provider  albuterol (PROVENTIL HFA;VENTOLIN HFA) 108 (90 Base) MCG/ACT inhaler Inhale 2 puffs into the lungs every 6 (six) hours as needed for wheezing or shortness of breath.   Yes [provider]  amLODipine (NORVASC) 5 MG tablet Take 5 mg by mouth 2 (two) times daily. 01/22/17  Yes [provider]  Esomeprazole Magnesium (NEXIUM PO) Take 22.3-44.6 mg by mouth 2 (two) times daily.    Yes [provider]  ibuprofen (ADVIL,MOTRIN) 200 MG tablet Take 1,200 mg by mouth 2 (two) times daily.   Yes [provider]  esomeprazole (NEXIUM) 40 MG capsule Take 1 capsule (40 mg total) by mouth daily. Patient not taking: Reported on 02/01/2017 12/22/16   Jeannett Senior, PA-C  fluticasone (FLONASE) 50 MCG/ACT nasal spray Place 2 sprays into both nostrils daily. Patient not taking: Reported on 02/01/2017 12/22/16   Jeannett Senior, PA-C  ranitidine (ZANTAC) 150 MG tablet Take 1 tablet (150 mg total) by mouth 2 (two) times daily. Patient not taking: Reported on 02/01/2017 10/05/16   Nat Christen, MD    Family History No family history on file.  Social History Social History  Substance Use Topics  . Smoking status: Current Every Day Smoker    Packs/day: 0.50    Types: Cigarettes  . Smokeless tobacco: Never Used  . Alcohol use Yes     Comment: socially     Allergies   Patient has no known allergies.   Review of Systems Review of Systems  Constitutional: Negative for chills and fever.  HENT: Negative for congestion and sore throat.   Eyes: Negative for visual disturbance.  Respiratory: Negative for cough and shortness of breath.   Cardiovascular: Negative for chest pain.  Gastrointestinal: Positive for abdominal pain. Negative for blood in stool, diarrhea, nausea and vomiting.  Genitourinary: Negative for dysuria.  Musculoskeletal: Negative for back pain and neck pain.  Skin: Negative for rash.  Neurological: Negative for headaches.     Physical Exam Updated Vital Signs BP (!) 161/100   Pulse (!) 53   Temp 98.3 F (36.8 C) (Oral)   Resp 16   SpO2 99%   Physical Exam  Constitutional: He appears well-developed and well-nourished. No distress.  Nontoxic-appearing.  HENT:  Head: Normocephalic and atraumatic.  Mouth/Throat: Oropharynx is clear and moist.  Eyes: Pupils are equal, round, and reactive to light. Conjunctivae are normal. Right eye  exhibits no discharge. Left eye exhibits no discharge. Scleral icterus is present.  Slight scleral icterus noted bilaterally.  Neck: Neck supple.  Cardiovascular: Normal rate, regular rhythm, normal heart sounds and intact distal pulses.  Exam reveals no gallop and no friction rub.   No murmur heard. Pulmonary/Chest: Effort normal and breath sounds normal. No respiratory distress. He has no wheezes. He has no rales.  Abdominal: Soft. Bowel sounds are normal. He exhibits no distension and no mass. There is tenderness. There is no rebound and no guarding.  Abdomen is soft. Bowel sounds are present. Patient has epigastric abdominal tenderness to palpation. No right upper quadrant tenderness. No significant hepatomegaly noted.  Musculoskeletal: He exhibits no edema.  Lymphadenopathy:    He has no cervical adenopathy.  Neurological: He is alert. Coordination normal.  Skin: Skin is warm and dry. No rash noted. He is not diaphoretic. No erythema. No pallor.  Psychiatric: He has a normal mood and affect. His behavior is normal.  Nursing note and vitals reviewed.    ED Treatments / Results  Labs (all labs ordered are listed, but only abnormal results are displayed) Labs Reviewed  LIPASE, BLOOD - Abnormal; Notable for the following:       Result Value   Lipase 69 (*)    All other components within normal limits  COMPREHENSIVE METABOLIC PANEL - Abnormal; Notable for the following:    Potassium 3.1 (*)    Glucose, Bld 112 (*)    Creatinine, Ser 1.25 (*)    Total Protein 6.4 (*)    AST 52 (*)    Total Bilirubin 3.9 (*)    All other components within normal limits  CBC - Abnormal; Notable for the following:    RBC 3.05 (*)    Hemoglobin 10.9 (*)    HCT 32.3 (*)    MCV 105.9 (*)    MCH 35.7 (*)    RDW 19.8 (*)    Platelets 23 (*)    All other components within normal limits  URINALYSIS, ROUTINE W REFLEX MICROSCOPIC - Abnormal; Notable for the following:    Color, Urine AMBER (*)    Hgb  urine dipstick MODERATE (*)    Protein, ur >=300 (*)    Bacteria, UA RARE (*)    Squamous Epithelial / LPF 0-5 (*)    All other components within normal limits  PROTIME-INR  RETICULOCYTES  PLATELET COUNT  BILIRUBIN, FRACTIONATED(TOT/DIR/INDIR)  LACTATE DEHYDROGENASE  SAVE SMEAR  HIV ANTIBODY (ROUTINE TESTING)  HEPATITIS PANEL, ACUTE  POC OCCULT BLOOD, ED    EKG  EKG Interpretation None       Radiology Ct Abdomen Pelvis W Contrast  Result Date: 02/01/2017 CLINICAL DATA:  Upper abdominal pain for 1-2 years. EXAM: CT ABDOMEN AND PELVIS WITH CONTRAST TECHNIQUE: Multidetector CT imaging of the abdomen and pelvis was performed using the standard protocol following bolus administration of intravenous contrast. CONTRAST:  100 cc ISOVUE-300 IOPAMIDOL (ISOVUE-300) INJECTION 61% COMPARISON:  None. FINDINGS: Lower chest: No acute abnormality. Hepatobiliary: No focal  liver abnormality is seen. No gallstones, gallbladder wall thickening, or biliary dilatation. Pancreas: Unremarkable. No pancreatic ductal dilatation or surrounding inflammatory changes. Spleen: Normal in size without focal abnormality. Adrenals/Urinary Tract: Adrenal glands are unremarkable. Kidneys are normal, without renal calculi, focal lesion, or hydronephrosis. Bladder is unremarkable. Stomach/Bowel: Diffuse mucosal thickening of the gastric mucosa. Appendix is not identified but there are no secondary signs of appendicitis. No evidence of bowel wall thickening, distention, or inflammatory changes. Left colonic diverticulosis. Vascular/Lymphatic: No significant vascular findings are present. No enlarged abdominal or pelvic lymph nodes. Reproductive: Prostate is unremarkable. Other: No abdominal wall hernia or abnormality. Minimal amount of free fluid in the posterior pelvis. Musculoskeletal: No acute or significant osseous findings. IMPRESSION: Diffuse mucosal thickening of the gastric wall. This may represent inflammatory changes  versus an infiltrative process. Correlation with upper endoscopy may be considered. Left colonic diverticulosis, without CT evidence of diverticulitis. Small amount of free fluid in the pelvis, nonspecific finding usually associated with inflammatory changes. No CT evidence of abnormalities within the solid abdominal organs. Electronically Signed   By: Fidela Salisbury M.D.   On: 02/01/2017 18:31    Procedures Procedures (including critical care time)  Medications Ordered in ED Medications  iopamidol (ISOVUE-300) 61 % injection (100 mLs  Contrast Given 02/01/17 1755)  gi cocktail (Maalox,Lidocaine,Donnatal) (30 mLs Oral Given 02/01/17 1922)     Initial Impression / Assessment and Plan / ED Course  I have reviewed the triage vital signs and the nursing notes.  Pertinent labs & imaging results that were available during my care of the patient were reviewed by me and considered in my medical decision making (see chart for details).     This is a 41 y.o. Male who presents to the emergency department complaining of upper abdominal pain ongoing for the past 2 years. Patient reports his pain is epigastric and right upper quadrant of his abdomen ongoing for the past 2 years that is gradually worsened. He reports his pain seems worse when lying flat. He denies any current nausea, vomiting or diarrhea. He does report he drinks every day and usually at least 6-12 beers a day. He denies history of pancreatitis. He reports taking 1200 mg of ibuprofen twice a day for his abdominal pain at home.  On exam patient is afebrile nontoxic appearing. His abdomen is soft and is epigastric abdominal tenderness to palpation. No hepatomegaly or splenomegaly noted on my exam. CMP is remarkable for an AST of 52 and a total bilirubin of 3.9. Creatinine is mildly elevated at 1.25. CBC shows an anemia with a hematoma 10.9. Platelet count is 23,000. Patient's most recent hematoma was 15.7. Most recent platelet count in  August was 147,000. Hemoccult is negative. CT abdomen and pelvis shows mucosal thickening of the gastric wall consistent with likely alcoholic gastritis. There is no evidence of any cirrhosis or splenomegaly on CT scan. I consulted with GI Dr. Henrene Pastor who Suggest patient to hematology by the patient's thrombocytopenia. Because the patient does not have cirrhosis on CT patient may be having direct bone marrow toxicity due to alcohol causing thrombus cytopenia or some other cause. He recommends hematology consult. I consulted with hematologist Dr. Benay Spice who would like the patient admitted to medicine. He would like a fractionated bilirubin, LDH, reticulocyte count, save smear, DAT, HIV and hepatitis panel. He will see the patient in consult in the AM.  Patient agrees with plan for admission. I consulted with hospitalist Dr. Maudie Mercury who accepted the patient for admission.  This patient was discussed with Dr. Tomi Bamberger who agrees with assessment and plan.   Final Clinical Impressions(s) / ED Diagnoses   Final diagnoses:  Thrombocytopenia ([facility])  Acute alcoholic gastritis without hemorrhage    New Prescriptions New Prescriptions   No medications on file     Sharmaine Base 02/01/17 2106    Duffy Bruce, MD 02/02/17 [phone]

## 2017-02-01 NOTE — H&P (Addendum)
[facility] H&P   Patient Demographics:    Eric Hooper, is a 41 y.o. male  MRN: [id number]   DOB - [birth year]  Admit Date - 02/01/2017  Outpatient Primary MD for the patient is Patient, No Pcp Per   NO PCP  Referring MD/NP/PA:  Waynetta Pean  Outpatient Specialists:   Patient coming from: home (just moved from [facility])  Chief Complaint  Patient presents with  . Abdominal Pain      HPI:    Eric Hooper  is a 41 y.o. male,  w gerd, hypertension, apparently c/o epigastric pain x2 years.  Pt has had prior egd/colonoscopy => negative in [place] <1 year ago.  Pt has tried ibuprofen and tylenol without relief.  Pt has tried zantac without relief.  Pt has tried nexium without relief.  Nothing appears to make better or worse.  Food doesn't make it worse.  Pt presented to ED for chronic abdominal pain.   In ED, pt was noted to have severe thrombocytopenia. Plt =23.  No bleeding.  Hgb 10.9,  K=3.1,  Bun/creat 19/1.25 Pt will be admitted for thrombocytopenia, and abdominal pain.      Review of systems:    In addition to the HPI above,  No Fever-chills, No Headache, No changes with Vision or hearing, No problems swallowing food or Liquids, No Chest pain, Cough or Shortness of Breath, No Nausea or Vommitting, Bowel movements are regular, No Blood in stool or Urine, No dysuria, No new skin rashes or bruises, No new joints pains-aches,  No new weakness, tingling, numbness in any extremity, No recent weight gain or loss, No polyuria, polydypsia or polyphagia, No significant Mental Stressors.  A full 10 point Review of Systems was done, except as stated above, all other Review of Systems were negative.   With Past History of the following :    Past Medical History:  Diagnosis Date  . Diabetes mellitus without complication (HCC)    non-insulin dependent  . Diverticulosis 02/01/2017   on  CT scan abd/pelvis  . GERD (gastroesophageal reflux disease)   . Hypertension       Past Surgical History:  Procedure Laterality Date  . ELBOW SURGERY Right       Social History:     Social History  Substance Use Topics  . Smoking status: Current Every Day Smoker    Packs/day: 0.50    Types: Cigarettes  . Smokeless tobacco: Never Used  . Alcohol use Yes     Comment: socially     Lives - at home   Mobility -  Walks by self   Family History :     Family History  Problem Relation Age of Onset  . Hypertension Mother   . Hyperlipidemia Mother    No family history of thrombocytopenia   Home Medications:   Prior to Admission medications   Medication Sig Start Date  End Date Taking? Authorizing Provider  albuterol (PROVENTIL HFA;VENTOLIN HFA) 108 (90 Base) MCG/ACT inhaler Inhale 2 puffs into the lungs every 6 (six) hours as needed for wheezing or shortness of breath.   Yes [provider]  amLODipine (NORVASC) 5 MG tablet Take 5 mg by mouth 2 (two) times daily. 01/22/17  Yes [provider]  Esomeprazole Magnesium (NEXIUM PO) Take 22.3-44.6 mg by mouth 2 (two) times daily.   Yes [provider]  ibuprofen (ADVIL,MOTRIN) 200 MG tablet Take 1,200 mg by mouth 2 (two) times daily.   Yes [provider]  esomeprazole (NEXIUM) 40 MG capsule Take 1 capsule (40 mg total) by mouth daily. Patient not taking: Reported on 02/01/2017 12/22/16   Jeannett Senior, PA-C  fluticasone (FLONASE) 50 MCG/ACT nasal spray Place 2 sprays into both nostrils daily. Patient not taking: Reported on 02/01/2017 12/22/16   Jeannett Senior, PA-C  ranitidine (ZANTAC) 150 MG tablet Take 1 tablet (150 mg total) by mouth 2 (two) times daily. Patient not taking: Reported on 02/01/2017 10/05/16   Nat Christen, MD     Allergies:    No Known Allergies   Physical Exam:   Vitals  Blood pressure (!) 161/100, pulse (!) 53, temperature 98.3 F (36.8 C), temperature source  Oral, resp. rate 16, SpO2 99 %.   1. General  lying in bed in NAD,   2. Normal affect and insight, Not Suicidal or Homicidal, Awake Alert, Oriented X 3.  3. No F.N deficits, ALL C.Nerves Intact, Strength 5/5 all 4 extremities, Sensation intact all 4 extremities, Plantars down going.  4. Ears and Eyes appear Normal, Conjunctivae clear, PERRLA. Moist Oral Mucosa.  5. Supple Neck, No JVD, No cervical lymphadenopathy appriciated, No Carotid Bruits.  6. Symmetrical Chest wall movement, Good air movement bilaterally, CTAB.  7. RRR, No Gallops, Rubs or Murmurs, No Parasternal Heave.  8. Positive Bowel Sounds, Abdomen Soft, No tenderness, No organomegaly appriciated,No rebound -guarding or rigidity.  9.  No Cyanosis, Normal Skin Turgor, No Skin Rash or Bruise.  10. Good muscle tone,  joints appear normal , no effusions, Normal ROM.  11. No Palpable Lymph Nodes in Neck or Axillae     Data Review:    CBC  Recent Labs Lab 02/01/17 1337  WBC 8.6  HGB 10.9*  HCT 32.3*  PLT 23*  MCV 105.9*  MCH 35.7*  MCHC 33.7  RDW 19.8*   ------------------------------------------------------------------------------------------------------------------  Chemistries   Recent Labs Lab 02/01/17 1337  NA 140  K 3.1*  CL 105  CO2 27  GLUCOSE 112*  BUN 19  CREATININE 1.25*  CALCIUM 9.7  AST 52*  ALT 31  ALKPHOS 99  BILITOT 3.9*   ------------------------------------------------------------------------------------------------------------------ CrCl cannot be calculated (Unknown ideal weight.). ------------------------------------------------------------------------------------------------------------------ No results for input(s): TSH, T4TOTAL, T3FREE, THYROIDAB in the last 72 hours.  Invalid input(s): FREET3  Coagulation profile  Recent Labs Lab 02/01/17 1647  INR 1.10    ------------------------------------------------------------------------------------------------------------------- No results for input(s): DDIMER in the last 72 hours. -------------------------------------------------------------------------------------------------------------------  Cardiac Enzymes No results for input(s): CKMB, TROPONINI, MYOGLOBIN in the last 168 hours.  Invalid input(s): CK ------------------------------------------------------------------------------------------------------------------ No results found for: BNP   ---------------------------------------------------------------------------------------------------------------  Urinalysis    Component Value Date/Time   COLORURINE AMBER (A) 02/01/2017 [place] 02/01/2017 1348   LABSPEC 1.027 02/01/2017 1348   PHURINE 5.0 02/01/2017 1348   GLUCOSEU NEGATIVE 02/01/2017 1348   HGBUR MODERATE (A) 02/01/2017 1348   BILIRUBINUR NEGATIVE 02/01/2017 1348   KETONESUR NEGATIVE  02/01/2017 1348   PROTEINUR >=300 (A) 02/01/2017 1348   NITRITE NEGATIVE 02/01/2017 1348   LEUKOCYTESUR NEGATIVE 02/01/2017 1348    ----------------------------------------------------------------------------------------------------------------   Imaging Results:    Ct Abdomen Pelvis W Contrast  Result Date: 02/01/2017 CLINICAL DATA:  Upper abdominal pain for 1-2 years. EXAM: CT ABDOMEN AND PELVIS WITH CONTRAST TECHNIQUE: Multidetector CT imaging of the abdomen and pelvis was performed using the standard protocol following bolus administration of intravenous contrast. CONTRAST:  100 cc ISOVUE-300 IOPAMIDOL (ISOVUE-300) INJECTION 61% COMPARISON:  None. FINDINGS: Lower chest: No acute abnormality. Hepatobiliary: No focal liver abnormality is seen. No gallstones, gallbladder wall thickening, or biliary dilatation. Pancreas: Unremarkable. No pancreatic ductal dilatation or surrounding inflammatory changes. Spleen: Normal in size  without focal abnormality. Adrenals/Urinary Tract: Adrenal glands are unremarkable. Kidneys are normal, without renal calculi, focal lesion, or hydronephrosis. Bladder is unremarkable. Stomach/Bowel: Diffuse mucosal thickening of the gastric mucosa. Appendix is not identified but there are no secondary signs of appendicitis. No evidence of bowel wall thickening, distention, or inflammatory changes. Left colonic diverticulosis. Vascular/Lymphatic: No significant vascular findings are present. No enlarged abdominal or pelvic lymph nodes. Reproductive: Prostate is unremarkable. Other: No abdominal wall hernia or abnormality. Minimal amount of free fluid in the posterior pelvis. Musculoskeletal: No acute or significant osseous findings. IMPRESSION: Diffuse mucosal thickening of the gastric wall. This may represent inflammatory changes versus an infiltrative process. Correlation with upper endoscopy may be considered. Left colonic diverticulosis, without CT evidence of diverticulitis. Small amount of free fluid in the pelvis, nonspecific finding usually associated with inflammatory changes. No CT evidence of abnormalities within the solid abdominal organs. Electronically Signed   By: Fidela Salisbury M.D.   On: 02/01/2017 18:31      Assessment & Plan:    Principal Problem:   Thrombocytopenia (HCC) Active Problems:   Hypokalemia   Anemia   Hyperglycemia   Thrombocytopenia Check smear, check pt, ptt, d dimer, fibrinogen, LDH Stop Zantac Heme/onc consulted by ED, appreciate input.   Anemia Repeat cbc in am  Abdominal pain Continue PPI Nexium=> protonix 40mg  po bid Please try to obtain records from [place] in am and consider GI consultation STOP NSAIDS  Gerd protonix  Hypokalemia Check magnesium Replete Check cmp in am  Hyperglycemia Check hga1c  Hypertension uncontrolled Continue amlodipine  ETOH use CIWA  Microscopic hematuria  Urine cytology Check psa Consider urology  consult in am   ADDENDUM< pt has shistocytes on smear per ED per [facility].  May have TTP Thought has no headache, no ams, no ARF.  Appreciate input.   DVT Prophylaxis - SCDs   AM Labs Ordered, also please review Full Orders  Family Communication: Admission, patients condition and plan of care including tests being ordered have been discussed with the patient  who indicate understanding and agree with the plan and Code Status.  Code Status FULL CODE  Likely DC to  home  Condition GUARDED    Consults called:   Heme/onc by ED  Admission status: inpatient  Time spent in minutes : 45   Jani Gravel M.D on 02/01/2017 at 9:21 PM  Between 7am to 7pm - Pager - [phone]  . After 7pm go to [email or web address] - password [facility]  [facility] - Office  [phone]

## 2017-02-01 NOTE — ED Notes (Signed)
Unsuccessful attempt at giving report to 2W. 

## 2017-02-01 NOTE — Progress Notes (Signed)
Spoke with IR Damita Dunnings), they will put in temp HD catheter first thing in AM. Appreciate input.   Spoke with nephrology, they will contact dialysis nurse to put on list for plasma exchange.

## 2017-02-01 NOTE — Consult Note (Addendum)
New Hematology/Oncology Consult   Referral HG:[id number] Kim  Reason for Referral: Anemia/Thrombocytopenia  HPI: [name] presented to the emergency room with upper abdominal pain. He reports intermittent upper abdominal pain for the past few years. He had reflux symptoms in the past, relieved with Nexium.he takes 1200 mg of ibuprofen twice daily for relief of the upper abdominal pain. He takes ibuprofen approximately 4 days per week.  No dysphagia, nausea/vomiting, or bleeding.he reports undergoing a negative upper and lower endoscopies approximate 1 year ago while living in [place].  He was seen in the emergency room on 12/22/2016 with similar pain. He was diagnosed with "gastritis ".  On admission to the emergency room todaythe hemoglobin returned at 10.9 with a platelet count of 23,000. On 12/22/2016 the hemoglobin was measured at 15.7 with a platelet count of 143,000.  He drinks beer daily. He usually has a "a few beers ", but drinks 6-12 beers on the weekend while watching football.     Past Medical History:  Diagnosis Date  . Diabetes mellitus without complication (HCC)    non-insulin dependent  . Diverticulosis 02/01/2017   on CT scan abd/pelvis  . GERD (gastroesophageal reflux disease)   . Hypertension   :  Past Surgical History:  Procedure Laterality Date  . ELBOW SURGERY Right   :   Current Facility-Administered Medications:  .  HYDROmorphone (DILAUDID) injection 1 mg, 1 mg, Intravenous, Q4H PRN, Jani Gravel, MD .  Derrill Memo ON 02/02/2017] insulin aspart (novoLOG) injection 0-9 Units, 0-9 Units, Subcutaneous, TID WC, Jani Gravel, MD .  ondansetron [facility]) injection 4 mg, 4 mg, Intravenous, Q6H PRN, Jani Gravel, MD  Current Outpatient Prescriptions:  .  albuterol (PROVENTIL HFA;VENTOLIN HFA) 108 (90 Base) MCG/ACT inhaler, Inhale 2 puffs into the lungs every 6 (six) hours as needed for wheezing or shortness of breath., Disp: , Rfl:  .  amLODipine (NORVASC) 5 MG tablet,  Take 5 mg by mouth 2 (two) times daily., Disp: , Rfl: 0 .  Esomeprazole Magnesium (NEXIUM PO), Take 22.3-44.6 mg by mouth 2 (two) times daily., Disp: , Rfl:  .  ibuprofen (ADVIL,MOTRIN) 200 MG tablet, Take 1,200 mg by mouth 2 (two) times daily., Disp: , Rfl:  .  esomeprazole (NEXIUM) 40 MG capsule, Take 1 capsule (40 mg total) by mouth daily. (Patient not taking: Reported on 02/01/2017), Disp: 30 capsule, Rfl: 0 .  fluticasone (FLONASE) 50 MCG/ACT nasal spray, Place 2 sprays into both nostrils daily. (Patient not taking: Reported on 02/01/2017), Disp: 16 g, Rfl: 2 .  ranitidine (ZANTAC) 150 MG tablet, Take 1 tablet (150 mg total) by mouth 2 (two) times daily. (Patient not taking: Reported on 02/01/2017), Disp: 60 tablet, Rfl: 0:  . [START ON 02/02/2017] insulin aspart  0-9 Units Subcutaneous TID WC  :  No Known Allergies:  FH:a maternal aunt had breast cancer  SOCIAL HISTORY:he lives with his fianc in [place]. He works in a [employer], manufacturing. He smokes cigarettes and drinks beer. No transfusion history. No risk factor for HIV or hepatitis.  Review of Systems:  Positives include:pain in the xiphoid region for several years  A complete ROS was otherwise negative.   Physical Exam:  Blood pressure (!) 161/100, pulse (!) 53, temperature 98.3 F (36.8 C), temperature source Oral, resp. rate 16, SpO2 99 %.  HEENT: oropharynx without visible mass, no thrush or ulcers, neck without mass Lungs: coarse rhonchi at the posterior base bilaterally, no respiratory distress Cardiac: regular rate and rhythm Abdomen: no hepatosplenomegaly, no  mass, tender over the xiphoid GU: testes without mass  Vascular: no leg edema Lymph nodes: no cervical, supraclavicular, axillary, or inguinal nodes Neurologic: alert and oriented, the face is symmetric, the speech is fluent, the motor exam appears intact in the upper and lower extremities Skin: multiple tattoos, no rash Musculoskeletal: no spine  tenderness  LABS:   Recent Labs  02/01/17 1337 02/01/17 2058  WBC 8.6  --   HGB 10.9*  --   HCT 32.3*  --   PLT 23* 24*   Peripheral blood smear: The white cell morphology is unremarkable. The plates are decreased in number with scattered large/giant platelet forms. No platelet clumps. The polychromasia is increased. Numerous fragmented red cells. A few microsphere sites. Rare nucleated red cell.  Recent Labs  02/01/17 1337  NA 140  K 3.1*  CL 105  CO2 27  GLUCOSE 112*  BUN 19  CREATININE 1.25*  CALCIUM 9.7  bilirubin 4.1, indirect bilirubin 3.7, LDH 1010    RADIOLOGY:  Ct Abdomen Pelvis W Contrast  Result Date: 02/01/2017 CLINICAL DATA:  Upper abdominal pain for 1-2 years. EXAM: CT ABDOMEN AND PELVIS WITH CONTRAST TECHNIQUE: Multidetector CT imaging of the abdomen and pelvis was performed using the standard protocol following bolus administration of intravenous contrast. CONTRAST:  100 cc ISOVUE-300 IOPAMIDOL (ISOVUE-300) INJECTION 61% COMPARISON:  None. FINDINGS: Lower chest: No acute abnormality. Hepatobiliary: No focal liver abnormality is seen. No gallstones, gallbladder wall thickening, or biliary dilatation. Pancreas: Unremarkable. No pancreatic ductal dilatation or surrounding inflammatory changes. Spleen: Normal in size without focal abnormality. Adrenals/Urinary Tract: Adrenal glands are unremarkable. Kidneys are normal, without renal calculi, focal lesion, or hydronephrosis. Bladder is unremarkable. Stomach/Bowel: Diffuse mucosal thickening of the gastric mucosa. Appendix is not identified but there are no secondary signs of appendicitis. No evidence of bowel wall thickening, distention, or inflammatory changes. Left colonic diverticulosis. Vascular/Lymphatic: No significant vascular findings are present. No enlarged abdominal or pelvic lymph nodes. Reproductive: Prostate is unremarkable. Other: No abdominal wall hernia or abnormality. Minimal amount of free fluid in the  posterior pelvis. Musculoskeletal: No acute or significant osseous findings. IMPRESSION: Diffuse mucosal thickening of the gastric wall. This may represent inflammatory changes versus an infiltrative process. Correlation with upper endoscopy may be considered. Left colonic diverticulosis, without CT evidence of diverticulitis. Small amount of free fluid in the pelvis, nonspecific finding usually associated with inflammatory changes. No CT evidence of abnormalities within the solid abdominal organs. Electronically Signed   By: Fidela Salisbury M.D.   On: 02/01/2017 18:31    Assessment and Plan:   1. Severe thrombocytopenia 2. Hemolytic anemia 3. Xiphoid pain 4. Hypertension 5. Alcohol and tobacco use 6. Diabetes 7. Renal insufficiency   [name] presents with pain in the xiphoid region. The etiology of his pain is unclear. A CT of the abdomen/pelvis is unremarkable. He has severe thrombocytopenia and mild macrocytic anemia. The hemoglobin and platelet count have dropped significantly compared to when he was here in August. The peripheral blood smear and markers of hemolysis are consistent with a microangiopathic process. He appears to have TTP. There is no apparent associated systemic condition and I cannot relate the TTP to his xiphoid discomfort.  I discussed the diagnosis and recommendation for plasma exchange with [name]. We discussed the potential of an allergic reaction, symptomatic hypocalcemia, and transmission of infectious agents plasma exchange. He agrees to proceed.  Recommendations: 1. Consult interventional radiology for placement of an apheresis catheter 2. Solu-Medrol 3. Check HIV and  hepatitis serologies 4. Daily 1 volume plasma exchange 5. Check [name] activity 6. DAT 7. Daily CBC, cmet, and LDH 8. RBC folate     Donneta Romberg, MD 02/01/2017, 9:45 PM

## 2017-02-02 ENCOUNTER — Encounter (HOSPITAL_COMMUNITY): Payer: Self-pay | Admitting: Interventional Radiology

## 2017-02-02 ENCOUNTER — Inpatient Hospital Stay (HOSPITAL_COMMUNITY): Payer: BLUE CROSS/BLUE SHIELD

## 2017-02-02 DIAGNOSIS — N179 Acute kidney failure, unspecified: Secondary | ICD-10-CM

## 2017-02-02 DIAGNOSIS — R739 Hyperglycemia, unspecified: Secondary | ICD-10-CM

## 2017-02-02 DIAGNOSIS — K2920 Alcoholic gastritis without bleeding: Secondary | ICD-10-CM

## 2017-02-02 HISTORY — PX: IR US GUIDE VASC ACCESS RIGHT: IMG2390

## 2017-02-02 HISTORY — PX: IR FLUORO GUIDE CV LINE RIGHT: IMG2283

## 2017-02-02 LAB — POCT I-STAT, CHEM 8
BUN: 16 mg/dL (ref 6–20)
CALCIUM ION: 1.19 mmol/L (ref 1.15–1.40)
CHLORIDE: 101 mmol/L (ref 101–111)
Creatinine, Ser: 0.9 mg/dL (ref 0.61–1.24)
Glucose, Bld: 119 mg/dL — ABNORMAL HIGH (ref 65–99)
HCT: 28 % — ABNORMAL LOW (ref 39.0–52.0)
Hemoglobin: 9.5 g/dL — ABNORMAL LOW (ref 13.0–17.0)
POTASSIUM: 3.4 mmol/L — AB (ref 3.5–5.1)
SODIUM: 141 mmol/L (ref 135–145)
TCO2: 27 mmol/L (ref 22–32)

## 2017-02-02 LAB — COMPREHENSIVE METABOLIC PANEL
ALT: 35 U/L (ref 17–63)
AST: 52 U/L — AB (ref 15–41)
Albumin: 3.9 g/dL (ref 3.5–5.0)
Alkaline Phosphatase: 86 U/L (ref 38–126)
Anion gap: 8 (ref 5–15)
BILIRUBIN TOTAL: 3.9 mg/dL — AB (ref 0.3–1.2)
BUN: 17 mg/dL (ref 6–20)
CO2: 29 mmol/L (ref 22–32)
CREATININE: 1.12 mg/dL (ref 0.61–1.24)
Calcium: 9 mg/dL (ref 8.9–10.3)
Chloride: 102 mmol/L (ref 101–111)
GFR calc Af Amer: >60 mL/min (ref >60)
Glucose, Bld: 118 mg/dL — ABNORMAL HIGH (ref 65–99)
POTASSIUM: 3 mmol/L — AB (ref 3.5–5.1)
Sodium: 139 mmol/L (ref 135–145)
TOTAL PROTEIN: 6.1 g/dL — AB (ref 6.5–8.1)

## 2017-02-02 LAB — PSA: Prostatic Specific Antigen: 0.5 ng/mL (ref 0.00–4.00)

## 2017-02-02 LAB — CBC
HEMATOCRIT: 31 % — AB (ref 39.0–52.0)
Hemoglobin: 10.2 g/dL — ABNORMAL LOW (ref 13.0–17.0)
MCH: 35.1 pg — ABNORMAL HIGH (ref 26.0–34.0)
MCHC: 32.9 g/dL (ref 30.0–36.0)
MCV: 106.5 fL — ABNORMAL HIGH (ref 78.0–100.0)
PLATELETS: 21 10*3/uL — AB (ref 150–400)
RBC: 2.91 MIL/uL — ABNORMAL LOW (ref 4.22–5.81)
RDW: 19.7 % — AB (ref 11.5–15.5)
WBC: 9 10*3/uL (ref 4.0–10.5)

## 2017-02-02 LAB — DIRECT ANTIGLOBULIN TEST (NOT AT ARMC)
DAT, COMPLEMENT: NEGATIVE
DAT, IGG: NEGATIVE

## 2017-02-02 LAB — GLUCOSE, CAPILLARY
GLUCOSE-CAPILLARY: 306 mg/dL — AB (ref 65–99)
GLUCOSE-CAPILLARY: 186 mg/dL — AB (ref 65–99)
Glucose-Capillary: 126 mg/dL — ABNORMAL HIGH (ref 65–99)

## 2017-02-02 LAB — HIV ANTIBODY (ROUTINE TESTING W REFLEX): HIV Screen 4th Generation wRfx: NONREACTIVE

## 2017-02-02 LAB — ABO/RH: ABO/RH(D): A POS

## 2017-02-02 LAB — PATHOLOGIST SMEAR REVIEW

## 2017-02-02 MED ORDER — ACETAMINOPHEN 325 MG PO TABS: 650.0000 mg | ORAL_TABLET | ORAL | Status: DC | PRN

## 2017-02-02 MED ORDER — HYDROXYZINE HCL 25 MG PO TABS
25.0000 mg | ORAL_TABLET | Freq: Three times a day (TID) | ORAL | Status: DC | PRN
  Administered 2017-02-02 – 2017-02-04 (×5): 25 mg via ORAL
  Filled 2017-02-02 (×5): qty 1

## 2017-02-02 MED ORDER — DIPHENHYDRAMINE HCL 25 MG PO CAPS
25.0000 mg | ORAL_CAPSULE | Freq: Four times a day (QID) | ORAL | Status: DC | PRN
  Administered 2017-02-02: 25 mg via ORAL
  Filled 2017-02-02: qty 1

## 2017-02-02 MED ORDER — ACD FORMULA A 0.73-2.45-2.2 GM/100ML VI SOLN
Status: AC
  Administered 2017-02-02: 500 mL via INTRAVENOUS
  Filled 2017-02-02: qty 500

## 2017-02-02 MED ORDER — SODIUM CHLORIDE 0.9 % IV SOLN
2.0000 g | Freq: Once | INTRAVENOUS | Status: DC
  Administered 2017-02-02: 2 g via INTRAVENOUS
  Filled 2017-02-02: qty 20

## 2017-02-02 MED ORDER — LIDOCAINE HCL 1 % IJ SOLN
INTRAMUSCULAR | Status: AC
  Filled 2017-02-02: qty 20

## 2017-02-02 MED ORDER — HYDROMORPHONE HCL 1 MG/ML IJ SOLN
INTRAMUSCULAR | Status: AC
  Filled 2017-02-02: qty 1

## 2017-02-02 MED ORDER — HEPARIN SODIUM (PORCINE) 1000 UNIT/ML IJ SOLN
INTRAMUSCULAR | Status: AC
  Administered 2017-02-02: 2.8 mL
  Filled 2017-02-02: qty 1

## 2017-02-02 MED ORDER — CALCIUM CARBONATE ANTACID 500 MG PO CHEW
2.0000 | CHEWABLE_TABLET | ORAL | Status: AC
  Administered 2017-02-02 (×2): 400 mg via ORAL
  Filled 2017-02-02: qty 2

## 2017-02-02 MED ORDER — LIDOCAINE HCL (PF) 1 % IJ SOLN
INTRAMUSCULAR | Status: DC | PRN
  Administered 2017-02-02: 8 mL

## 2017-02-02 MED ORDER — ANTICOAGULANT SODIUM CITRATE 4% (200MG/5ML) IV SOLN
5.0000 mL | Freq: Once | Status: AC
  Administered 2017-02-02: 5 mL
  Filled 2017-02-02: qty 250

## 2017-02-02 MED ORDER — HYDROCODONE-ACETAMINOPHEN 5-325 MG PO TABS
1.0000 | ORAL_TABLET | Freq: Four times a day (QID) | ORAL | Status: DC | PRN
  Administered 2017-02-02 – 2017-02-03 (×3): 2 via ORAL
  Administered 2017-02-04: 1 via ORAL
  Filled 2017-02-02 (×5): qty 2

## 2017-02-02 MED ORDER — ACD FORMULA A 0.73-2.45-2.2 GM/100ML VI SOLN
500.0000 mL | Status: DC
  Administered 2017-02-02 (×2): 500 mL via INTRAVENOUS
  Filled 2017-02-02: qty 500

## 2017-02-02 MED ORDER — TRAMADOL HCL 50 MG PO TABS: 50.0000 mg | ORAL_TABLET | Freq: Four times a day (QID) | ORAL | Status: DC | PRN

## 2017-02-02 MED ORDER — LIDOCAINE HCL 1 % IJ SOLN: INTRAMUSCULAR | Status: DC | PRN

## 2017-02-02 MED ORDER — CALCIUM CARBONATE ANTACID 500 MG PO CHEW
CHEWABLE_TABLET | ORAL | Status: AC
  Administered 2017-02-02: 400 mg via ORAL
  Filled 2017-02-02: qty 4

## 2017-02-02 NOTE — Progress Notes (Signed)
Patient received to room via stretcher, oriented to room . MD orders and plan of care reviewed with patient. Patient verbalizes understanding. Non skid socks provided to patient.

## 2017-02-02 NOTE — Progress Notes (Signed)
Pt transported to HD for TPE.

## 2017-02-02 NOTE — Progress Notes (Signed)
PROGRESS NOTE    Eric Hooper  KXF:[id number] DOB: [birth year] DOA: 02/01/2017 PCP: Patient, No Pcp Per   Brief Narrative: Eric Hooper is a 41 y.o. male,  w gerd, hypertension, apparently c/o epigastric pain x2 years. Patient found to have platelets of 23 concerning for TTP. Oncology consulted and started plasma exchange.   Assessment & Plan:   Principal Problem:   Thrombocytopenia ([place]) Active Problems:   Hypokalemia   Anemia   Hyperglycemia   Microscopic hematuria   Thrombocytopenia Concern for TTP. Undergoing plasma exchange. DAT negative. -oncology recommendations: plasma exchange, steroids, daily LDH/CBC/CMP -repeat CBC in AM -HIV, hepatitis pending -RBC folate, [name] pending  Abdominal pain Chronic. -obtain medical records -continue PPI  Hypokalemia Potassium containing fluids. Stop date for evening of 10/9 -recheck BMP in AM  Hyperglycemia Hemoglobin A1C pending  Essential hypertension -continue amlodipine  Ethanol use -continue CIWA  Microscopic hematuria No signs/symptoms of UTI. Possibly related to ?TTP. PSA obtained on admission and below reference range. -recommend repeat urinalysis as an outpatient   DVT prophylaxis: SCDs Code Status: Full code Family Communication: Fiance at bedside Disposition Plan: Discharge pending treatment   Consultants:   Hematology/Oncology  Nephrology  Interventional radiology  Procedures:   HD Cath (10/9)  Plasma exchange (10/9>>  Antimicrobials:  None    Subjective: No concerns. No bleeding.  Objective: Vitals:   02/02/17 1252 02/02/17 1307 02/02/17 1320 02/02/17 1332  BP: (!) 140/96 (!) 147/87 (!) 139/97 (!) 140/100  Pulse:      Resp: (!) 21 (!) 24 19 (!) 21  Temp: 98.2 F (36.8 C) 98.2 F (36.8 C) 98.2 F (36.8 C) 98.2 F (36.8 C)  TempSrc:      SpO2: 100% 100% 100% 100%  Weight:      Height:        Intake/Output Summary (Last 24 hours) at 02/02/17 1337 Last data filed at  02/02/17 0900  Gross per 24 hour  Intake           681.25 ml  Output                0 ml  Net           681.25 ml   Filed Weights   02/01/17 2317  Weight: 90.7 kg (200 lb)    Examination:  General exam: Appears calm and comfortable Respiratory system: Clear to auscultation. Respiratory effort normal. Cardiovascular system: S1 & S2 heard, RRR. No murmurs. Gastrointestinal system: Abdomen is nondistended, soft and nontender. Normal bowel sounds heard. Central nervous system: Alert and oriented. No focal neurological deficits. Extremities: No edema. No calf tenderness Skin: No cyanosis. No rashes Psychiatry: Judgement and insight appear normal. Mood & affect appropriate.     Data Reviewed: I have personally reviewed following labs and imaging studies  CBC:  Recent Labs Lab 02/01/17 1337 02/01/17 2058 02/02/17 0422 02/02/17 1121  WBC 8.6  --  9.0  --   HGB 10.9*  --  10.2* 9.5*  HCT 32.3*  --  31.0* 28.0*  MCV 105.9*  --  106.5*  --   PLT 23* 24* 21*  --    Basic Metabolic Panel:  Recent Labs Lab 02/01/17 1337 02/02/17 0422 02/02/17 1121  NA 140 139 141  K 3.1* 3.0* 3.4*  CL 105 102 101  CO2 27 29  --   GLUCOSE 112* 118* 119*  BUN 19 17 16   CREATININE 1.25* 1.12 0.90  CALCIUM 9.7 9.0  --    GFR: Estimated  Creatinine Clearance: 119.9 mL/min (by C-G formula based on SCr of 0.9 mg/dL). Liver Function Tests:  Recent Labs Lab 02/01/17 1337 02/01/17 2058 02/02/17 0422  AST 52*  --  52*  ALT 31  --  35  ALKPHOS 99  --  86  BILITOT 3.9* 4.1* 3.9*  PROT 6.4*  --  6.1*  ALBUMIN 4.0  --  3.9    Recent Labs Lab 02/01/17 1337  LIPASE 69*   No results for input(s): AMMONIA in the last 168 hours. Coagulation Profile:  Recent Labs Lab 02/01/17 1647  INR 1.10   Cardiac Enzymes: No results for input(s): CKTOTAL, CKMB, CKMBINDEX, TROPONINI in the last 168 hours. BNP (last 3 results) No results for input(s): PROBNP in the last 8760 hours. HbA1C: No  results for input(s): HGBA1C in the last 72 hours. CBG:  Recent Labs Lab 02/01/17 2323 02/02/17 0407  GLUCAP 120* 126*   Lipid Profile: No results for input(s): CHOL, HDL, LDLCALC, TRIG, CHOLHDL, LDLDIRECT in the last 72 hours. Thyroid Function Tests: No results for input(s): TSH, T4TOTAL, FREET4, T3FREE, THYROIDAB in the last 72 hours. Anemia Panel:  Recent Labs  02/01/17 2058  RETICCTPCT 11.2*   Sepsis Labs: No results for input(s): PROCALCITON, LATICACIDVEN in the last 168 hours.  No results found for this or any previous visit (from the past 240 hour(s)).       Radiology Studies: Ct Abdomen Pelvis W Contrast  Result Date: 02/01/2017 CLINICAL DATA:  Upper abdominal pain for 1-2 years. EXAM: CT ABDOMEN AND PELVIS WITH CONTRAST TECHNIQUE: Multidetector CT imaging of the abdomen and pelvis was performed using the standard protocol following bolus administration of intravenous contrast. CONTRAST:  100 cc ISOVUE-300 IOPAMIDOL (ISOVUE-300) INJECTION 61% COMPARISON:  None. FINDINGS: Lower chest: No acute abnormality. Hepatobiliary: No focal liver abnormality is seen. No gallstones, gallbladder wall thickening, or biliary dilatation. Pancreas: Unremarkable. No pancreatic ductal dilatation or surrounding inflammatory changes. Spleen: Normal in size without focal abnormality. Adrenals/Urinary Tract: Adrenal glands are unremarkable. Kidneys are normal, without renal calculi, focal lesion, or hydronephrosis. Bladder is unremarkable. Stomach/Bowel: Diffuse mucosal thickening of the gastric mucosa. Appendix is not identified but there are no secondary signs of appendicitis. No evidence of bowel wall thickening, distention, or inflammatory changes. Left colonic diverticulosis. Vascular/Lymphatic: No significant vascular findings are present. No enlarged abdominal or pelvic lymph nodes. Reproductive: Prostate is unremarkable. Other: No abdominal wall hernia or abnormality. Minimal amount of free  fluid in the posterior pelvis. Musculoskeletal: No acute or significant osseous findings. IMPRESSION: Diffuse mucosal thickening of the gastric wall. This may represent inflammatory changes versus an infiltrative process. Correlation with upper endoscopy may be considered. Left colonic diverticulosis, without CT evidence of diverticulitis. Small amount of free fluid in the pelvis, nonspecific finding usually associated with inflammatory changes. No CT evidence of abnormalities within the solid abdominal organs. Electronically Signed   By: Fidela Salisbury M.D.   On: 02/01/2017 18:31   Ir Fluoro Guide Cv Line Right  Result Date: 02/02/2017 CLINICAL DATA:  Thrombocytopenia, TTP and need for non tunneled dialysis catheter for plasmapheresis. EXAM: NON-TUNNELED CENTRAL VENOUS CATHETER PLACEMENT WITH ULTRASOUND AND FLUOROSCOPIC GUIDANCE FLUOROSCOPY TIME:  12 seconds.  1.0 mGy. PROCEDURE: The procedure, risks, benefits, and alternatives were explained to the patient. Questions regarding the procedure were encouraged and answered. The patient understands and consents to the procedure. A time-out was performed prior to initiating the procedure. Ultrasound was utilized to confirm patency of the right internal jugular vein. The right neck and  chest were prepped with chlorhexidine in a sterile fashion, and a sterile drape was applied covering the operative field. Maximum barrier sterile technique with sterile gowns and gloves were used for the procedure. Local anesthesia was provided with 1% lidocaine. After creating a small venotomy incision, a 19 gauge needle was advanced into the right internal jugular vein under direct, real-time ultrasound guidance. Ultrasound image documentation was performed. After securing guidewire access, venous access was dilated. A 20 cm, 12 French triple lumen Mahurkar non tunneled catheter was then advanced over the wire. Final catheter positioning was confirmed and documented with a  fluoroscopic spot image. The catheter was aspirated, flushed with saline, and injected with appropriate volume heparin dwells. The catheter exit site was secured with a 0-Prolene retention suture. COMPLICATIONS: None.  No pneumothorax. FINDINGS: After catheter placement, the tip lies at the cavoatrial junction. The catheter aspirates normally and is ready for immediate use. IMPRESSION: Placement of non-tunneled HD/pheresis catheter via the right internal jugular vein. The catheter tip lies at the cavoatrial junction. The catheter is ready for immediate use. Electronically Signed   By: Aletta Edouard M.D.   On: 02/02/2017 09:45   Ir US Guide Vasc Access Right  Result Date: 02/02/2017 CLINICAL DATA:  Thrombocytopenia, TTP and need for non tunneled dialysis catheter for plasmapheresis. EXAM: NON-TUNNELED CENTRAL VENOUS CATHETER PLACEMENT WITH ULTRASOUND AND FLUOROSCOPIC GUIDANCE FLUOROSCOPY TIME:  12 seconds.  1.0 mGy. PROCEDURE: The procedure, risks, benefits, and alternatives were explained to the patient. Questions regarding the procedure were encouraged and answered. The patient understands and consents to the procedure. A time-out was performed prior to initiating the procedure. Ultrasound was utilized to confirm patency of the right internal jugular vein. The right neck and chest were prepped with chlorhexidine in a sterile fashion, and a sterile drape was applied covering the operative field. Maximum barrier sterile technique with sterile gowns and gloves were used for the procedure. Local anesthesia was provided with 1% lidocaine. After creating a small venotomy incision, a 19 gauge needle was advanced into the right internal jugular vein under direct, real-time ultrasound guidance. Ultrasound image documentation was performed. After securing guidewire access, venous access was dilated. A 20 cm, 12 French triple lumen Mahurkar non tunneled catheter was then advanced over the wire. Final catheter  positioning was confirmed and documented with a fluoroscopic spot image. The catheter was aspirated, flushed with saline, and injected with appropriate volume heparin dwells. The catheter exit site was secured with a 0-Prolene retention suture. COMPLICATIONS: None.  No pneumothorax. FINDINGS: After catheter placement, the tip lies at the cavoatrial junction. The catheter aspirates normally and is ready for immediate use. IMPRESSION: Placement of non-tunneled HD/pheresis catheter via the right internal jugular vein. The catheter tip lies at the cavoatrial junction. The catheter is ready for immediate use. Electronically Signed   By: Aletta Edouard M.D.   On: 02/02/2017 09:45        Scheduled Meds: . amLODipine  5 mg Oral BID  . folic acid  1 mg Oral Daily  . insulin aspart  0-9 Units Subcutaneous TID WC  . lidocaine      . LORazepam  0-4 mg Intravenous Q6H   Followed by  . [START ON 02/04/2017] LORazepam  0-4 mg Intravenous Q12H  . methylPREDNISolone (SOLU-MEDROL) injection  1 mg/kg Intravenous Daily  . multivitamin with minerals  1 tablet Oral Daily  . pantoprazole  40 mg Oral Daily  . thiamine  100 mg Oral Daily   Or  .  thiamine  100 mg Intravenous Daily   Continuous Infusions: . 0.9 % NaCl with KCl 40 mEq / L 75 mL/hr (02/02/17 0035)  . anticoagulant sodium citrate    . calcium gluconate IVPB 2 g (02/02/17 1230)  . citrate dextrose 500 mL (02/02/17 1257)     LOS: 1 day     Cordelia Poche, MD [facility] 02/02/2017, 1:37 PM Pager: [phone]  If 7PM-7AM, please contact night-coverage [email or web address] Password [email or web address] 02/02/2017, 1:37 PM

## 2017-02-02 NOTE — Progress Notes (Addendum)
IP PROGRESS NOTE  Subjective:   He continues to have pain in the xiphoid region. No other complaint. No bleeding.  Objective: Vital signs in last 24 hours: Blood pressure (!) 138/93, pulse 71, temperature 98.3 F (36.8 C), resp. rate (!) 21, height 5\' 10"  (1.778 m), weight 200 lb (90.7 kg), SpO2 100 %.  Intake/Output from previous day: 10/08 0701 - 10/09 0700 In: 681.3 [I.V.:181.3; IV Piggyback:500] Out: 0   Physical Exam:  Abdomen: Tender at the xiphoid and subxiphoid area. No mass.   Portacath/PICC-without erythema  Lab Results:  Recent Labs  02/01/17 1337 02/01/17 2058 02/02/17 0422 02/02/17 1121  WBC 8.6  --  9.0  --   HGB 10.9*  --  10.2* 9.5*  HCT 32.3*  --  31.0* 28.0*  PLT 23* 24* 21*  --     BMET  Recent Labs  02/01/17 1337 02/02/17 0422 02/02/17 1121  NA 140 139 141  K 3.1* 3.0* 3.4*  CL 105 102 101  CO2 27 29  --   GLUCOSE 112* 118* 119*  BUN 19 17 16   CREATININE 1.25* 1.12 0.90  CALCIUM 9.7 9.0  --     No results found for: CEA1  Studies/Results: Ct Abdomen Pelvis W Contrast  Result Date: 02/01/2017 CLINICAL DATA:  Upper abdominal pain for 1-2 years. EXAM: CT ABDOMEN AND PELVIS WITH CONTRAST TECHNIQUE: Multidetector CT imaging of the abdomen and pelvis was performed using the standard protocol following bolus administration of intravenous contrast. CONTRAST:  100 cc ISOVUE-300 IOPAMIDOL (ISOVUE-300) INJECTION 61% COMPARISON:  None. FINDINGS: Lower chest: No acute abnormality. Hepatobiliary: No focal liver abnormality is seen. No gallstones, gallbladder wall thickening, or biliary dilatation. Pancreas: Unremarkable. No pancreatic ductal dilatation or surrounding inflammatory changes. Spleen: Normal in size without focal abnormality. Adrenals/Urinary Tract: Adrenal glands are unremarkable. Kidneys are normal, without renal calculi, focal lesion, or hydronephrosis. Bladder is unremarkable. Stomach/Bowel: Diffuse mucosal thickening of the gastric  mucosa. Appendix is not identified but there are no secondary signs of appendicitis. No evidence of bowel wall thickening, distention, or inflammatory changes. Left colonic diverticulosis. Vascular/Lymphatic: No significant vascular findings are present. No enlarged abdominal or pelvic lymph nodes. Reproductive: Prostate is unremarkable. Other: No abdominal wall hernia or abnormality. Minimal amount of free fluid in the posterior pelvis. Musculoskeletal: No acute or significant osseous findings. IMPRESSION: Diffuse mucosal thickening of the gastric wall. This may represent inflammatory changes versus an infiltrative process. Correlation with upper endoscopy may be considered. Left colonic diverticulosis, without CT evidence of diverticulitis. Small amount of free fluid in the pelvis, nonspecific finding usually associated with inflammatory changes. No CT evidence of abnormalities within the solid abdominal organs. Electronically Signed   By: Fidela Salisbury M.D.   On: 02/01/2017 18:31   Ir Fluoro Guide Cv Line Right  Result Date: 02/02/2017 CLINICAL DATA:  Thrombocytopenia, TTP and need for non tunneled dialysis catheter for plasmapheresis. EXAM: NON-TUNNELED CENTRAL VENOUS CATHETER PLACEMENT WITH ULTRASOUND AND FLUOROSCOPIC GUIDANCE FLUOROSCOPY TIME:  12 seconds.  1.0 mGy. PROCEDURE: The procedure, risks, benefits, and alternatives were explained to the patient. Questions regarding the procedure were encouraged and answered. The patient understands and consents to the procedure. A time-out was performed prior to initiating the procedure. Ultrasound was utilized to confirm patency of the right internal jugular vein. The right neck and chest were prepped with chlorhexidine in a sterile fashion, and a sterile drape was applied covering the operative field. Maximum barrier sterile technique with sterile gowns and gloves were used for  the procedure. Local anesthesia was provided with 1% lidocaine. After creating  a small venotomy incision, a 19 gauge needle was advanced into the right internal jugular vein under direct, real-time ultrasound guidance. Ultrasound image documentation was performed. After securing guidewire access, venous access was dilated. A 20 cm, 12 French triple lumen Mahurkar non tunneled catheter was then advanced over the wire. Final catheter positioning was confirmed and documented with a fluoroscopic spot image. The catheter was aspirated, flushed with saline, and injected with appropriate volume heparin dwells. The catheter exit site was secured with a 0-Prolene retention suture. COMPLICATIONS: None.  No pneumothorax. FINDINGS: After catheter placement, the tip lies at the cavoatrial junction. The catheter aspirates normally and is ready for immediate use. IMPRESSION: Placement of non-tunneled HD/pheresis catheter via the right internal jugular vein. The catheter tip lies at the cavoatrial junction. The catheter is ready for immediate use. Electronically Signed   By: Aletta Edouard M.D.   On: 02/02/2017 09:45   Ir US Guide Vasc Access Right  Result Date: 02/02/2017 CLINICAL DATA:  Thrombocytopenia, TTP and need for non tunneled dialysis catheter for plasmapheresis. EXAM: NON-TUNNELED CENTRAL VENOUS CATHETER PLACEMENT WITH ULTRASOUND AND FLUOROSCOPIC GUIDANCE FLUOROSCOPY TIME:  12 seconds.  1.0 mGy. PROCEDURE: The procedure, risks, benefits, and alternatives were explained to the patient. Questions regarding the procedure were encouraged and answered. The patient understands and consents to the procedure. A time-out was performed prior to initiating the procedure. Ultrasound was utilized to confirm patency of the right internal jugular vein. The right neck and chest were prepped with chlorhexidine in a sterile fashion, and a sterile drape was applied covering the operative field. Maximum barrier sterile technique with sterile gowns and gloves were used for the procedure. Local anesthesia was  provided with 1% lidocaine. After creating a small venotomy incision, a 19 gauge needle was advanced into the right internal jugular vein under direct, real-time ultrasound guidance. Ultrasound image documentation was performed. After securing guidewire access, venous access was dilated. A 20 cm, 12 French triple lumen Mahurkar non tunneled catheter was then advanced over the wire. Final catheter positioning was confirmed and documented with a fluoroscopic spot image. The catheter was aspirated, flushed with saline, and injected with appropriate volume heparin dwells. The catheter exit site was secured with a 0-Prolene retention suture. COMPLICATIONS: None.  No pneumothorax. FINDINGS: After catheter placement, the tip lies at the cavoatrial junction. The catheter aspirates normally and is ready for immediate use. IMPRESSION: Placement of non-tunneled HD/pheresis catheter via the right internal jugular vein. The catheter tip lies at the cavoatrial junction. The catheter is ready for immediate use. Electronically Signed   By: Aletta Edouard M.D.   On: 02/02/2017 09:45    Medications: I have reviewed the patient's current medications.  Assessment/Plan:  1. TTP 2. Xiphoid pain 3. Hypertension 4. Diabetes 5. Out: Tobacco use 6. Renal insufficiency  Eric Hooper appears unchanged. The clinical presentation is most consistent with TTP. It is possible the TTP is related to use of high doses of ibuprofen.  Recommendations: 1. He is scheduled for placement of an a pheresis catheter this morning 2. Proceed with daily plasma exchange, continue Solu-Medrol 3. Daily CBC, see med, and LDH 4. Trial of tramadol for the xiphoid pain 5. Consider GI consultation to evaluate the upper abdominal pain  LOS: 1 day   Donneta Romberg, MD   02/02/2017, 2:06 PM

## 2017-02-03 ENCOUNTER — Encounter (HOSPITAL_COMMUNITY): Payer: Self-pay | Admitting: Internal Medicine

## 2017-02-03 DIAGNOSIS — I10 Essential (primary) hypertension: Secondary | ICD-10-CM

## 2017-02-03 DIAGNOSIS — D539 Nutritional anemia, unspecified: Secondary | ICD-10-CM

## 2017-02-03 DIAGNOSIS — L299 Pruritus, unspecified: Secondary | ICD-10-CM

## 2017-02-03 DIAGNOSIS — R072 Precordial pain: Secondary | ICD-10-CM

## 2017-02-03 DIAGNOSIS — Z720 Tobacco use: Secondary | ICD-10-CM

## 2017-02-03 DIAGNOSIS — N289 Disorder of kidney and ureter, unspecified: Secondary | ICD-10-CM

## 2017-02-03 DIAGNOSIS — E119 Type 2 diabetes mellitus without complications: Secondary | ICD-10-CM

## 2017-02-03 DIAGNOSIS — Z7289 Other problems related to lifestyle: Secondary | ICD-10-CM

## 2017-02-03 DIAGNOSIS — Z789 Other specified health status: Secondary | ICD-10-CM

## 2017-02-03 DIAGNOSIS — M311 Thrombotic microangiopathy: Principal | ICD-10-CM

## 2017-02-03 DIAGNOSIS — D72829 Elevated white blood cell count, unspecified: Secondary | ICD-10-CM

## 2017-02-03 DIAGNOSIS — N200 Calculus of kidney: Secondary | ICD-10-CM

## 2017-02-03 HISTORY — DX: Calculus of kidney: N20.0

## 2017-02-03 HISTORY — DX: Disorder of kidney and ureter, unspecified: N28.9

## 2017-02-03 LAB — THERAPEUTIC PLASMA EXCHANGE (BLOOD BANK)
PLASMA VOLUME NEEDED: 3390
UNIT DIVISION: 0
UNIT DIVISION: 0
UNIT DIVISION: 0
UNIT DIVISION: 0
UNIT DIVISION: 0
Unit division: 0
Unit division: 0
Unit division: 0
Unit division: 0
Unit division: 0
Unit division: 0

## 2017-02-03 LAB — CBC
HEMATOCRIT: 28.3 % — AB (ref 39.0–52.0)
HEMOGLOBIN: 9.8 g/dL — AB (ref 13.0–17.0)
MCH: 36.7 pg — AB (ref 26.0–34.0)
MCHC: 34.6 g/dL (ref 30.0–36.0)
MCV: 106 fL — ABNORMAL HIGH (ref 78.0–100.0)
Platelets: 23 10*3/uL — CL (ref 150–400)
RBC: 2.67 MIL/uL — AB (ref 4.22–5.81)
RDW: 19.4 % — ABNORMAL HIGH (ref 11.5–15.5)
WBC: 12.5 10*3/uL — ABNORMAL HIGH (ref 4.0–10.5)

## 2017-02-03 LAB — COMPREHENSIVE METABOLIC PANEL
ALK PHOS: 56 U/L (ref 38–126)
ALT: 25 U/L (ref 17–63)
ANION GAP: 7 (ref 5–15)
AST: 33 U/L (ref 15–41)
Albumin: 3.4 g/dL — ABNORMAL LOW (ref 3.5–5.0)
BUN: 23 mg/dL — ABNORMAL HIGH (ref 6–20)
CALCIUM: 9 mg/dL (ref 8.9–10.3)
CO2: 28 mmol/L (ref 22–32)
Chloride: 104 mmol/L (ref 101–111)
Creatinine, Ser: 1.21 mg/dL (ref 0.61–1.24)
GFR calc non Af Amer: >60 mL/min (ref >60)
Glucose, Bld: 166 mg/dL — ABNORMAL HIGH (ref 65–99)
POTASSIUM: 3.9 mmol/L (ref 3.5–5.1)
SODIUM: 139 mmol/L (ref 135–145)
TOTAL PROTEIN: 5.5 g/dL — AB (ref 6.5–8.1)
Total Bilirubin: 2.8 mg/dL — ABNORMAL HIGH (ref 0.3–1.2)

## 2017-02-03 LAB — POCT I-STAT, CHEM 8
BUN: 19 mg/dL (ref 6–20)
CALCIUM ION: 1.19 mmol/L (ref 1.15–1.40)
Chloride: 98 mmol/L — ABNORMAL LOW (ref 101–111)
Creatinine, Ser: 0.9 mg/dL (ref 0.61–1.24)
GLUCOSE: 202 mg/dL — AB (ref 65–99)
HCT: 31 % — ABNORMAL LOW (ref 39.0–52.0)
Hemoglobin: 10.5 g/dL — ABNORMAL LOW (ref 13.0–17.0)
Potassium: 3.8 mmol/L (ref 3.5–5.1)
SODIUM: 139 mmol/L (ref 135–145)
TCO2: 26 mmol/L (ref 22–32)

## 2017-02-03 LAB — HEPATITIS PANEL, ACUTE
HCV Ab: <0.1 s/co ratio (ref 0.0–0.9)
HEP A IGM: NEGATIVE
Hep B C IgM: NEGATIVE
Hepatitis B Surface Ag: NEGATIVE

## 2017-02-03 LAB — GLUCOSE, CAPILLARY
GLUCOSE-CAPILLARY: 300 mg/dL — AB (ref 65–99)
Glucose-Capillary: 132 mg/dL — ABNORMAL HIGH (ref 65–99)
Glucose-Capillary: 289 mg/dL — ABNORMAL HIGH (ref 65–99)

## 2017-02-03 LAB — HEMOGLOBIN A1C: Hgb A1c MFr Bld: <4.2 % — ABNORMAL LOW (ref 4.8–5.6)

## 2017-02-03 LAB — FOLATE RBC
FOLATE, RBC: 1779 ng/mL (ref >498)
Folate, Hemolysate: 519.5 ng/mL
Hematocrit: 29.2 % — ABNORMAL LOW (ref 37.5–51.0)

## 2017-02-03 LAB — LACTATE DEHYDROGENASE: LDH: 491 U/L — ABNORMAL HIGH (ref 98–192)

## 2017-02-03 MED ORDER — SODIUM CHLORIDE 0.9 % IV SOLN
2.0000 g | Freq: Once | INTRAVENOUS | Status: AC
  Administered 2017-02-03: 2 g via INTRAVENOUS
  Filled 2017-02-03: qty 20

## 2017-02-03 MED ORDER — ACETAMINOPHEN 325 MG PO TABS: 650.0000 mg | ORAL_TABLET | ORAL | Status: DC | PRN

## 2017-02-03 MED ORDER — CALCIUM CARBONATE ANTACID 500 MG PO CHEW
2.0000 | CHEWABLE_TABLET | ORAL | Status: AC
  Administered 2017-02-03: 400 mg via ORAL

## 2017-02-03 MED ORDER — ZOLPIDEM TARTRATE 5 MG PO TABS
5.0000 mg | ORAL_TABLET | Freq: Once | ORAL | Status: AC
  Administered 2017-02-03: 5 mg via ORAL
  Filled 2017-02-03: qty 1

## 2017-02-03 MED ORDER — ACD FORMULA A 0.73-2.45-2.2 GM/100ML VI SOLN
Status: AC
  Filled 2017-02-03: qty 500

## 2017-02-03 MED ORDER — ANTICOAGULANT SODIUM CITRATE 4% (200MG/5ML) IV SOLN
5.0000 mL | Freq: Once | Status: AC
  Administered 2017-02-03: 5 mL
  Filled 2017-02-03: qty 250

## 2017-02-03 MED ORDER — INSULIN ASPART 100 UNIT/ML ~~LOC~~ SOLN
5.0000 [IU] | Freq: Once | SUBCUTANEOUS | Status: AC
  Administered 2017-02-04: 5 [IU] via SUBCUTANEOUS

## 2017-02-03 MED ORDER — ACD FORMULA A 0.73-2.45-2.2 GM/100ML VI SOLN
500.0000 mL | Status: DC
  Administered 2017-02-05: 500 mL via INTRAVENOUS
  Filled 2017-02-03: qty 500

## 2017-02-03 MED ORDER — SODIUM CHLORIDE 0.9 % IV SOLN: INTRAVENOUS | Status: AC

## 2017-02-03 MED ORDER — DIPHENHYDRAMINE HCL 25 MG PO CAPS
25.0000 mg | ORAL_CAPSULE | Freq: Four times a day (QID) | ORAL | Status: DC | PRN
  Filled 2017-02-03: qty 1

## 2017-02-03 MED ORDER — CALCIUM CARBONATE ANTACID 500 MG PO CHEW
CHEWABLE_TABLET | ORAL | Status: AC
  Filled 2017-02-03: qty 2

## 2017-02-03 NOTE — Progress Notes (Signed)
IP PROGRESS NOTE  Subjective:   [name] reports tolerating the pheresis well, though he developed hives over the extremities area these resolved. No other symptoms of an allergic reaction. No bleeding. He reports improvement in the subxiphoid discomfort.  Objective: Vital signs in last 24 hours: Blood pressure (!) 148/83, pulse 71, temperature 98.1 F (36.7 C), temperature source Oral, resp. rate 20, height 5\' 10"  (1.778 m), weight 199 lb 8.3 oz (90.5 kg), SpO2 98 %.  Intake/Output from previous day: 10/09 0701 - 10/10 0700 In: 600 [P.O.:600] Out: 0   Physical Exam: HEENT: No thrush Lungs: Rhonchi at the right posterior base, no respiratory distress Cardiac: Regular rate and rhythm Abdomen: Mild tenderness at the xiphoid, no hepatosplenomegaly Vascular: No leg edema Skin: No rash .  Right IJ pheresis catheter with a gauze dressing  Lab Results:  Recent Labs  02/02/17 0422 02/02/17 1121 02/03/17 0250  WBC 9.0  --  12.5*  HGB 10.2* 9.5* 9.8*  HCT 31.0* 28.0* 28.3*  PLT 21*  --  23*    BMET  Recent Labs  02/02/17 0422 02/02/17 1121 02/03/17 0250  NA 139 141 139  K 3.0* 3.4* 3.9  CL 102 101 104  CO2 29  --  28  GLUCOSE 118* 119* 166*  BUN 17 16 23*  CREATININE 1.12 0.90 1.21  CALCIUM 9.0  --  9.0   Bilirubin 2.8, LDH 491, AST 33 Studies/Results: Ct Abdomen Pelvis W Contrast  Result Date: 02/01/2017 CLINICAL DATA:  Upper abdominal pain for 1-2 years. EXAM: CT ABDOMEN AND PELVIS WITH CONTRAST TECHNIQUE: Multidetector CT imaging of the abdomen and pelvis was performed using the standard protocol following bolus administration of intravenous contrast. CONTRAST:  100 cc ISOVUE-300 IOPAMIDOL (ISOVUE-300) INJECTION 61% COMPARISON:  None. FINDINGS: Lower chest: No acute abnormality. Hepatobiliary: No focal liver abnormality is seen. No gallstones, gallbladder wall thickening, or biliary dilatation. Pancreas: Unremarkable. No pancreatic ductal dilatation or  surrounding inflammatory changes. Spleen: Normal in size without focal abnormality. Adrenals/Urinary Tract: Adrenal glands are unremarkable. Kidneys are normal, without renal calculi, focal lesion, or hydronephrosis. Bladder is unremarkable. Stomach/Bowel: Diffuse mucosal thickening of the gastric mucosa. Appendix is not identified but there are no secondary signs of appendicitis. No evidence of bowel wall thickening, distention, or inflammatory changes. Left colonic diverticulosis. Vascular/Lymphatic: No significant vascular findings are present. No enlarged abdominal or pelvic lymph nodes. Reproductive: Prostate is unremarkable. Other: No abdominal wall hernia or abnormality. Minimal amount of free fluid in the posterior pelvis. Musculoskeletal: No acute or significant osseous findings. IMPRESSION: Diffuse mucosal thickening of the gastric wall. This may represent inflammatory changes versus an infiltrative process. Correlation with upper endoscopy may be considered. Left colonic diverticulosis, without CT evidence of diverticulitis. Small amount of free fluid in the pelvis, nonspecific finding usually associated with inflammatory changes. No CT evidence of abnormalities within the solid abdominal organs. Electronically Signed   By: Fidela Salisbury M.D.   On: 02/01/2017 18:31   Ir Fluoro Guide Cv Line Right  Result Date: 02/02/2017 CLINICAL DATA:  Thrombocytopenia, TTP and need for non tunneled dialysis catheter for plasmapheresis. EXAM: NON-TUNNELED CENTRAL VENOUS CATHETER PLACEMENT WITH ULTRASOUND AND FLUOROSCOPIC GUIDANCE FLUOROSCOPY TIME:  12 seconds.  1.0 mGy. PROCEDURE: The procedure, risks, benefits, and alternatives were explained to the patient. Questions regarding the procedure were encouraged and answered. The patient understands and consents to the procedure. A time-out was performed prior to initiating the procedure. Ultrasound was utilized to confirm patency of the right internal jugular  vein. The right neck and chest were prepped with chlorhexidine in a sterile fashion, and a sterile drape was applied covering the operative field. Maximum barrier sterile technique with sterile gowns and gloves were used for the procedure. Local anesthesia was provided with 1% lidocaine. After creating a small venotomy incision, a 19 gauge needle was advanced into the right internal jugular vein under direct, real-time ultrasound guidance. Ultrasound image documentation was performed. After securing guidewire access, venous access was dilated. A 20 cm, 12 French triple lumen Mahurkar non tunneled catheter was then advanced over the wire. Final catheter positioning was confirmed and documented with a fluoroscopic spot image. The catheter was aspirated, flushed with saline, and injected with appropriate volume heparin dwells. The catheter exit site was secured with a 0-Prolene retention suture. COMPLICATIONS: None.  No pneumothorax. FINDINGS: After catheter placement, the tip lies at the cavoatrial junction. The catheter aspirates normally and is ready for immediate use. IMPRESSION: Placement of non-tunneled HD/pheresis catheter via the right internal jugular vein. The catheter tip lies at the cavoatrial junction. The catheter is ready for immediate use. Electronically Signed   By: Aletta Edouard M.D.   On: 02/02/2017 09:45   Ir US Guide Vasc Access Right  Result Date: 02/02/2017 CLINICAL DATA:  Thrombocytopenia, TTP and need for non tunneled dialysis catheter for plasmapheresis. EXAM: NON-TUNNELED CENTRAL VENOUS CATHETER PLACEMENT WITH ULTRASOUND AND FLUOROSCOPIC GUIDANCE FLUOROSCOPY TIME:  12 seconds.  1.0 mGy. PROCEDURE: The procedure, risks, benefits, and alternatives were explained to the patient. Questions regarding the procedure were encouraged and answered. The patient understands and consents to the procedure. A time-out was performed prior to initiating the procedure. Ultrasound was utilized to confirm  patency of the right internal jugular vein. The right neck and chest were prepped with chlorhexidine in a sterile fashion, and a sterile drape was applied covering the operative field. Maximum barrier sterile technique with sterile gowns and gloves were used for the procedure. Local anesthesia was provided with 1% lidocaine. After creating a small venotomy incision, a 19 gauge needle was advanced into the right internal jugular vein under direct, real-time ultrasound guidance. Ultrasound image documentation was performed. After securing guidewire access, venous access was dilated. A 20 cm, 12 French triple lumen Mahurkar non tunneled catheter was then advanced over the wire. Final catheter positioning was confirmed and documented with a fluoroscopic spot image. The catheter was aspirated, flushed with saline, and injected with appropriate volume heparin dwells. The catheter exit site was secured with a 0-Prolene retention suture. COMPLICATIONS: None.  No pneumothorax. FINDINGS: After catheter placement, the tip lies at the cavoatrial junction. The catheter aspirates normally and is ready for immediate use. IMPRESSION: Placement of non-tunneled HD/pheresis catheter via the right internal jugular vein. The catheter tip lies at the cavoatrial junction. The catheter is ready for immediate use. Electronically Signed   By: Aletta Edouard M.D.   On: 02/02/2017 09:45    Medications: I have reviewed the patient's current medications.  Assessment/Plan:  1. TTP  Initiation of daily plasma exchange and Solu-Medrol 02/02/2017 2. Xiphoid pain-etiology unclear, improved 3. Hypertension 4. Diabetes 5. Alcohol/Tobacco use 6. Renal insufficiency 7. Right IJ dialysis catheter placed 02/02/2017  [name] appears stable. The markers of hemolysis are improved today. The [name] level is pending.  He reports developing hives during the a pheresis procedure yesterday. I discussed the case with the dialysis RN. She  reports she had pruritus prior to arriving in hemodialysis. She feels the pruritus may have been related  to Dilaudid he received prior to the a pheresis procedure. He had no other symptoms of an allergic reaction. He will be premedicated with Benadryl today.  Recommendations: 1. Continue Solu-Medrol and daily plasmapheresis 2. Daily CBC,cmet, and LDH-labs can be drawn in hemodialysis 3. Premedicate with Benadryl prior to plasmapheresis  Donneta Romberg, MD   02/03/2017, 9:59 AM

## 2017-02-03 NOTE — Care Management Note (Signed)
Case Management Note  Patient Details  Name: Eric Hooper MRN: [id number] Date of Birth: Dec 05, 1975  Subjective/Objective:                 Spoke to patient and wife at the bedside. Patient moved from [place] approx 10 months ago. States he has not made time to go to PCP appointment because he would have had to take off work for the appointment they had available. CM stressed importance of following up w PCP. Showed patient number on insurance card to call to get provider, patient's wife working on establishing PCP when CM left room. They expressed no difficulty getting medications or other barriers to care after DC.    Action/Plan:   Expected Discharge Date:                  Expected Discharge Plan:  Home/Self Care  In-House Referral:     Discharge planning Services  CM Consult  Post Acute Care Choice:    Choice offered to:     DME Arranged:    DME Agency:     HH Arranged:    HH Agency:     Status of Service:  In process, will continue to follow  If discussed at Long Length of Stay Meetings, dates discussed:    Additional Comments:  Carles Collet, RN 02/03/2017, 10:34 AM

## 2017-02-03 NOTE — Progress Notes (Signed)
PROGRESS NOTE    Eric Hooper  FYB:[id number] DOB: [birth year] DOA: 02/01/2017 PCP: Patient, No Pcp Per   Brief Narrative:  Eric Hooper is a 41 y.o. AA male with a PMH of GERD, Hypertension, apparently c/o epigastric pain x2 years along with other comorbids who presented to the ED for Epigastric/Subxiphoid Pan. Patient found to have platelets of 23 concerning for TTP. Oncology consulted and started IV Steroids and Plasmapheresis. Patient feels as if mid-epigastric/subxiphoid pain is improved.   Assessment & Plan:   Principal Problem:   Thrombocytopenia (HCC) Active Problems:   Hypokalemia   Macrocytic anemia   Hyperglycemia   Microscopic hematuria   Alcohol use   Renal insufficiency   Leukocytosis  Acute Thrombocytopenia -Concernd for TTP. Platelet Count is now 23K from 21K -Undergoing plasmapheresis. DAT negative. -Oncology Recommendations: Daily Plasmapheresis with premedication of Benadryl, Continued IV Solumedrol (1 mg/kg), Daily LDH/CBC/CMP -LDH went from 1,010 -> 491; T Bili Went from 4.1 -> 2.8 -HIV and Hepatitis Panel Negative  -RBC folate, [name] pending -Repeat CBC with Differential in AM   Abdominal Pain/SubXiphoid Pain -Lipase was 69 on Admission; Will repeat Lipase Level in AM   -Chronic; Improved -Obtain medical records from [place] -Continue PPI with Pantoprazole 40 mg po Daily and with Tramadol 50 mg po q6hprn  -If not improving will consider a GI Consultation   Hypokalemia -Improved. K+ went from 3.0 -> 3.4 -> 3.9 -Continue to Monitor and Replete as Necessary -Repeat CMP in AM   Leukocytosis -WBC went from 9.0 -> 12.5 -Likely from IV Steroid Demargination -Continue to Monitor for S/Sx of Infection -Repeat CBC in AM   Hyperglycemia likely from Steroid Demargination -CBG's ranging from 132-306 -Hemoglobin A1C was <4.2  Essential Hypertension -Continue Amlodipine 5 mg po BID -C/w Clonidine 0.1 mg po q6hprn for SBP>160  Alcohol Use -Continue  CIWA with Lorazepam -C/w Folic Acid, MVI, and Thiamine  -UDS was Negative   Microscopic Hematuria -No signs/symptoms of UTI. Possibly related to ?TTP.  -PSA obtained on admission and below reference range at 0.50 -Recommend repeat Urinalysis as an outpatient  Macrocytic Anemia -Patient's Hb/Hct went from 10.2/31.0 -> 9.8/28.3 -Continue to Monitor for S/Sx of Bleeding -Repeat CBC with Diff in AM   Mild Renal Insuffiencey/AKI -BUN/Cr went from 16/0.90 -> 23/1.21 -Continue to Monitor and repeat CMP in AM as patient has concern for TTP -Will start gentle IVF Rehydration with NS at 75 mL/hr x 10 hours   DVT prophylaxis: SCDs Code Status: FULL CODE Family Communication: Discussed with Wife at Bedside Disposition Plan: Remain Inpatient for continued Plasmapheresis   Consultants:   Hematology/Oncology  Nephrology  Interventional Radiology placed IJ on 02/02/17   Procedures:  Plasmapheresis with Right IJ Dialysis Catheter placed 02/01/17  Antimicrobials:  Anti-infectives    None     Subjective: Seen and examined at bedside and was feeling better and denied any pain today. No Lightheadedness or dizziness. No confusion. No CP or SOB. No other concerns or complaints at this time.   Objective: Vitals:   02/03/17 1340 02/03/17 1348 02/03/17 1401 02/03/17 1411  BP: (!) 156/91 (!) 151/84 (!) 145/91 (!) 146/88  Pulse: 94 87 85 89  Resp: (!) 31 (!) 27 19 (!) 27  Temp: 98.5 F (36.9 C) 98.7 F (37.1 C) 98.5 F (36.9 C) 98.7 F (37.1 C)  TempSrc: Oral Oral Oral Oral  SpO2:      Weight:      Height:        Intake/Output Summary (  Last 24 hours) at 02/03/17 1420 Last data filed at 02/03/17 0900  Gross per 24 hour  Intake             1080 ml  Output                0 ml  Net             1080 ml   Filed Weights   02/01/17 2317 02/02/17 2021  Weight: 90.7 kg (200 lb) 90.5 kg (199 lb 8.3 oz)   Examination: Physical Exam:  Constitutional: WN/WD AAM in NAD and appears calm  and comfortable Eyes: Lids and conjunctivae normal, sclerae anicteric  ENMT: External Ears, Nose appear normal. Grossly normal hearing. Mucous membranes are moist.  Neck: Appears normal, supple, no cervical masses, normal ROM, no appreciable thyromegaly, no JVD Respiratory: Clear to auscultation bilaterally, no wheezing, rales, rhonchi or crackles. Normal respiratory effort and patient is not tachypenic. No accessory muscle use.  Cardiovascular: RRR, no murmurs / rubs / gallops. S1 and S2 auscultated. No extremity edema.  Abdomen: Soft, non-tender, non-distended. No masses palpated. No appreciable hepatosplenomegaly. Bowel sounds positive.  GU: Deferred. Musculoskeletal: No clubbing / cyanosis of digits/nails. No joint deformity upper and lower extremities. Good ROM, no contractures.  Skin: No rashes, lesions, ulcers on a limited skin evaluation. No induration; Warm and dry.  Neurologic: CN 2-12 grossly intact with no focal deficits. Romberg sign cerebellar reflexes not assessed.  Psychiatric: Normal judgment and insight. Alert and oriented x 3. Normal mood and appropriate affect.   Data Reviewed: I have personally reviewed following labs and imaging studies  CBC:  Recent Labs Lab 02/01/17 1337 02/01/17 2058 02/02/17 0422 02/02/17 1121 02/03/17 0250  WBC 8.6  --  9.0  --  12.5*  HGB 10.9*  --  10.2* 9.5* 9.8*  HCT 32.3*  --  31.0* 28.0* 28.3*  MCV 105.9*  --  106.5*  --  106.0*  PLT 23* 24* 21*  --  23*   Basic Metabolic Panel:  Recent Labs Lab 02/01/17 1337 02/02/17 0422 02/02/17 1121 02/03/17 0250  NA 140 139 141 139  K 3.1* 3.0* 3.4* 3.9  CL 105 102 101 104  CO2 27 29  --  28  GLUCOSE 112* 118* 119* 166*  BUN 19 17 16  23*  CREATININE 1.25* 1.12 0.90 1.21  CALCIUM 9.7 9.0  --  9.0   GFR: Estimated Creatinine Clearance: 89.1 mL/min (by C-G formula based on SCr of 1.21 mg/dL). Liver Function Tests:  Recent Labs Lab 02/01/17 1337 02/01/17 2058 02/02/17 0422  02/03/17 0250  AST 52*  --  52* 33  ALT 31  --  35 25  ALKPHOS 99  --  86 56  BILITOT 3.9* 4.1* 3.9* 2.8*  PROT 6.4*  --  6.1* 5.5*  ALBUMIN 4.0  --  3.9 3.4*    Recent Labs Lab 02/01/17 1337  LIPASE 69*   No results for input(s): AMMONIA in the last 168 hours. Coagulation Profile:  Recent Labs Lab 02/01/17 1647  INR 1.10   Cardiac Enzymes: No results for input(s): CKTOTAL, CKMB, CKMBINDEX, TROPONINI in the last 168 hours. BNP (last 3 results) No results for input(s): PROBNP in the last 8760 hours. HbA1C:  Recent Labs  02/01/17 2316  HGBA1C <4.2*   CBG:  Recent Labs Lab 02/01/17 2323 02/02/17 0407 02/02/17 1702 02/02/17 2020 02/03/17 0824  GLUCAP 120* 126* 306* 186* 132*   Lipid Profile: No results for input(s): CHOL, HDL,  LDLCALC, TRIG, CHOLHDL, LDLDIRECT in the last 72 hours. Thyroid Function Tests: No results for input(s): TSH, T4TOTAL, FREET4, T3FREE, THYROIDAB in the last 72 hours. Anemia Panel:  Recent Labs  02/01/17 2058  RETICCTPCT 11.2*   Sepsis Labs: No results for input(s): PROCALCITON, LATICACIDVEN in the last 168 hours.  No results found for this or any previous visit (from the past 240 hour(s)).   Radiology Studies: Ct Abdomen Pelvis W Contrast  Result Date: 02/01/2017 CLINICAL DATA:  Upper abdominal pain for 1-2 years. EXAM: CT ABDOMEN AND PELVIS WITH CONTRAST TECHNIQUE: Multidetector CT imaging of the abdomen and pelvis was performed using the standard protocol following bolus administration of intravenous contrast. CONTRAST:  100 cc ISOVUE-300 IOPAMIDOL (ISOVUE-300) INJECTION 61% COMPARISON:  None. FINDINGS: Lower chest: No acute abnormality. Hepatobiliary: No focal liver abnormality is seen. No gallstones, gallbladder wall thickening, or biliary dilatation. Pancreas: Unremarkable. No pancreatic ductal dilatation or surrounding inflammatory changes. Spleen: Normal in size without focal abnormality. Adrenals/Urinary Tract: Adrenal glands  are unremarkable. Kidneys are normal, without renal calculi, focal lesion, or hydronephrosis. Bladder is unremarkable. Stomach/Bowel: Diffuse mucosal thickening of the gastric mucosa. Appendix is not identified but there are no secondary signs of appendicitis. No evidence of bowel wall thickening, distention, or inflammatory changes. Left colonic diverticulosis. Vascular/Lymphatic: No significant vascular findings are present. No enlarged abdominal or pelvic lymph nodes. Reproductive: Prostate is unremarkable. Other: No abdominal wall hernia or abnormality. Minimal amount of free fluid in the posterior pelvis. Musculoskeletal: No acute or significant osseous findings. IMPRESSION: Diffuse mucosal thickening of the gastric wall. This may represent inflammatory changes versus an infiltrative process. Correlation with upper endoscopy may be considered. Left colonic diverticulosis, without CT evidence of diverticulitis. Small amount of free fluid in the pelvis, nonspecific finding usually associated with inflammatory changes. No CT evidence of abnormalities within the solid abdominal organs. Electronically Signed   By: Fidela Salisbury M.D.   On: 02/01/2017 18:31   Ir Fluoro Guide Cv Line Right  Result Date: 02/02/2017 CLINICAL DATA:  Thrombocytopenia, TTP and need for non tunneled dialysis catheter for plasmapheresis. EXAM: NON-TUNNELED CENTRAL VENOUS CATHETER PLACEMENT WITH ULTRASOUND AND FLUOROSCOPIC GUIDANCE FLUOROSCOPY TIME:  12 seconds.  1.0 mGy. PROCEDURE: The procedure, risks, benefits, and alternatives were explained to the patient. Questions regarding the procedure were encouraged and answered. The patient understands and consents to the procedure. A time-out was performed prior to initiating the procedure. Ultrasound was utilized to confirm patency of the right internal jugular vein. The right neck and chest were prepped with chlorhexidine in a sterile fashion, and a sterile drape was applied covering  the operative field. Maximum barrier sterile technique with sterile gowns and gloves were used for the procedure. Local anesthesia was provided with 1% lidocaine. After creating a small venotomy incision, a 19 gauge needle was advanced into the right internal jugular vein under direct, real-time ultrasound guidance. Ultrasound image documentation was performed. After securing guidewire access, venous access was dilated. A 20 cm, 12 French triple lumen Mahurkar non tunneled catheter was then advanced over the wire. Final catheter positioning was confirmed and documented with a fluoroscopic spot image. The catheter was aspirated, flushed with saline, and injected with appropriate volume heparin dwells. The catheter exit site was secured with a 0-Prolene retention suture. COMPLICATIONS: None.  No pneumothorax. FINDINGS: After catheter placement, the tip lies at the cavoatrial junction. The catheter aspirates normally and is ready for immediate use. IMPRESSION: Placement of non-tunneled HD/pheresis catheter via the right internal jugular vein. The  catheter tip lies at the cavoatrial junction. The catheter is ready for immediate use. Electronically Signed   By: Aletta Edouard M.D.   On: 02/02/2017 09:45   Ir US Guide Vasc Access Right  Result Date: 02/02/2017 CLINICAL DATA:  Thrombocytopenia, TTP and need for non tunneled dialysis catheter for plasmapheresis. EXAM: NON-TUNNELED CENTRAL VENOUS CATHETER PLACEMENT WITH ULTRASOUND AND FLUOROSCOPIC GUIDANCE FLUOROSCOPY TIME:  12 seconds.  1.0 mGy. PROCEDURE: The procedure, risks, benefits, and alternatives were explained to the patient. Questions regarding the procedure were encouraged and answered. The patient understands and consents to the procedure. A time-out was performed prior to initiating the procedure. Ultrasound was utilized to confirm patency of the right internal jugular vein. The right neck and chest were prepped with chlorhexidine in a sterile fashion, and  a sterile drape was applied covering the operative field. Maximum barrier sterile technique with sterile gowns and gloves were used for the procedure. Local anesthesia was provided with 1% lidocaine. After creating a small venotomy incision, a 19 gauge needle was advanced into the right internal jugular vein under direct, real-time ultrasound guidance. Ultrasound image documentation was performed. After securing guidewire access, venous access was dilated. A 20 cm, 12 French triple lumen Mahurkar non tunneled catheter was then advanced over the wire. Final catheter positioning was confirmed and documented with a fluoroscopic spot image. The catheter was aspirated, flushed with saline, and injected with appropriate volume heparin dwells. The catheter exit site was secured with a 0-Prolene retention suture. COMPLICATIONS: None.  No pneumothorax. FINDINGS: After catheter placement, the tip lies at the cavoatrial junction. The catheter aspirates normally and is ready for immediate use. IMPRESSION: Placement of non-tunneled HD/pheresis catheter via the right internal jugular vein. The catheter tip lies at the cavoatrial junction. The catheter is ready for immediate use. Electronically Signed   By: Aletta Edouard M.D.   On: 02/02/2017 09:45   Scheduled Meds: . amLODipine  5 mg Oral BID  . calcium carbonate      . calcium carbonate  2 tablet Oral E2A  . folic acid  1 mg Oral Daily  . insulin aspart  0-9 Units Subcutaneous TID WC  . LORazepam  0-4 mg Intravenous Q6H   Followed by  . [START ON 02/04/2017] LORazepam  0-4 mg Intravenous Q12H  . methylPREDNISolone (SOLU-MEDROL) injection  1 mg/kg Intravenous Daily  . multivitamin with minerals  1 tablet Oral Daily  . pantoprazole  40 mg Oral Daily  . thiamine  100 mg Oral Daily   Or  . thiamine  100 mg Intravenous Daily   Continuous Infusions: . sodium chloride    . anticoagulant sodium citrate    . calcium gluconate IVPB 2 g (02/03/17 1247)  . citrate  dextrose    . citrate dextrose    . citrate dextrose      LOS: 2 days   Kerney Elbe, DO [facility] Pager [phone]  If 7PM-7AM, please contact night-coverage [email or web address] Password [email or web address] 02/03/2017, 2:20 PM

## 2017-02-04 LAB — CBC
HCT: 30 % — ABNORMAL LOW (ref 39.0–52.0)
Hemoglobin: 9.8 g/dL — ABNORMAL LOW (ref 13.0–17.0)
MCH: 35.3 pg — ABNORMAL HIGH (ref 26.0–34.0)
MCHC: 32.7 g/dL (ref 30.0–36.0)
MCV: 107.9 fL — AB (ref 78.0–100.0)
PLATELETS: 36 10*3/uL — AB (ref 150–400)
RBC: 2.78 MIL/uL — ABNORMAL LOW (ref 4.22–5.81)
RDW: 18.7 % — AB (ref 11.5–15.5)
WBC: 13.7 10*3/uL — AB (ref 4.0–10.5)

## 2017-02-04 LAB — DIFFERENTIAL
BASOS PCT: 0 %
Basophils Absolute: 0 10*3/uL (ref 0.0–0.1)
EOS ABS: 0.1 10*3/uL (ref 0.0–0.7)
EOS PCT: 1 %
Lymphocytes Relative: 35 %
Lymphs Abs: 4.9 10*3/uL — ABNORMAL HIGH (ref 0.7–4.0)
MONO ABS: 0.9 10*3/uL (ref 0.1–1.0)
MONOS PCT: 7 %
NEUTROS ABS: 8.1 10*3/uL — AB (ref 1.7–7.7)
Neutrophils Relative %: 57 %

## 2017-02-04 LAB — THERAPEUTIC PLASMA EXCHANGE (BLOOD BANK)
Plasma volume needed: 3390
UNIT DIVISION: 0
UNIT DIVISION: 0
Unit division: 0
Unit division: 0
Unit division: 0
Unit division: 0
Unit division: 0
Unit division: 0
Unit division: 0
Unit division: 0

## 2017-02-04 LAB — LIPASE, BLOOD: Lipase: 47 U/L (ref 11–51)

## 2017-02-04 LAB — POCT I-STAT, CHEM 8
BUN: 12 mg/dL (ref 6–20)
CHLORIDE: 95 mmol/L — AB (ref 101–111)
Calcium, Ion: 0.44 mmol/L — CL (ref 1.15–1.40)
Creatinine, Ser: 0.9 mg/dL (ref 0.61–1.24)
GLUCOSE: 209 mg/dL — AB (ref 65–99)
HCT: 27 % — ABNORMAL LOW (ref 39.0–52.0)
Hemoglobin: 9.2 g/dL — ABNORMAL LOW (ref 13.0–17.0)
POTASSIUM: 3.4 mmol/L — AB (ref 3.5–5.1)
Sodium: 144 mmol/L (ref 135–145)
TCO2: 28 mmol/L (ref 22–32)

## 2017-02-04 LAB — ADAMTS13 ANTIBODY: ADAMTS13 Antibody: 72 u/mL — ABNORMAL HIGH (ref <12)

## 2017-02-04 LAB — GLUCOSE, CAPILLARY
GLUCOSE-CAPILLARY: 393 mg/dL — AB (ref 65–99)
GLUCOSE-CAPILLARY: 240 mg/dL — AB (ref 65–99)
Glucose-Capillary: 156 mg/dL — ABNORMAL HIGH (ref 65–99)

## 2017-02-04 LAB — COMPREHENSIVE METABOLIC PANEL
ALK PHOS: 57 U/L (ref 38–126)
ALT: 28 U/L (ref 17–63)
AST: 27 U/L (ref 15–41)
Albumin: 3.3 g/dL — ABNORMAL LOW (ref 3.5–5.0)
Anion gap: 6 (ref 5–15)
BILIRUBIN TOTAL: 1.7 mg/dL — AB (ref 0.3–1.2)
BUN: 13 mg/dL (ref 6–20)
CALCIUM: 8.7 mg/dL — AB (ref 8.9–10.3)
CO2: 31 mmol/L (ref 22–32)
CREATININE: 1.06 mg/dL (ref 0.61–1.24)
Chloride: 103 mmol/L (ref 101–111)
Glucose, Bld: 129 mg/dL — ABNORMAL HIGH (ref 65–99)
Potassium: 3.3 mmol/L — ABNORMAL LOW (ref 3.5–5.1)
Sodium: 140 mmol/L (ref 135–145)
TOTAL PROTEIN: 5.6 g/dL — AB (ref 6.5–8.1)

## 2017-02-04 LAB — LACTATE DEHYDROGENASE: LDH: 322 U/L — ABNORMAL HIGH (ref 98–192)

## 2017-02-04 LAB — ADAMTS13 ACTIVITY

## 2017-02-04 MED ORDER — ACETAMINOPHEN 325 MG PO TABS
ORAL_TABLET | ORAL | Status: AC
  Filled 2017-02-04: qty 2

## 2017-02-04 MED ORDER — INSULIN ASPART 100 UNIT/ML ~~LOC~~ SOLN
0.0000 [IU] | Freq: Every day | SUBCUTANEOUS | Status: DC
  Administered 2017-02-04: 2 [IU] via SUBCUTANEOUS

## 2017-02-04 MED ORDER — SODIUM CHLORIDE 0.9 % IV SOLN
2.0000 g | Freq: Once | INTRAVENOUS | Status: AC
  Administered 2017-02-04: 2 g via INTRAVENOUS
  Filled 2017-02-04: qty 20

## 2017-02-04 MED ORDER — ACD FORMULA A 0.73-2.45-2.2 GM/100ML VI SOLN
500.0000 mL | Status: DC
  Administered 2017-02-04: 500 mL via INTRAVENOUS
  Filled 2017-02-04: qty 500

## 2017-02-04 MED ORDER — CALCIUM CARBONATE ANTACID 500 MG PO CHEW
2.0000 | CHEWABLE_TABLET | ORAL | Status: AC
  Administered 2017-02-04: 400 mg via ORAL
  Filled 2017-02-04: qty 2

## 2017-02-04 MED ORDER — DIPHENHYDRAMINE HCL 25 MG PO CAPS
25.0000 mg | ORAL_CAPSULE | Freq: Every day | ORAL | Status: DC
  Administered 2017-02-04 – 2017-02-07 (×3): 25 mg via ORAL
  Filled 2017-02-04 (×3): qty 1

## 2017-02-04 MED ORDER — ACETAMINOPHEN 325 MG PO TABS
650.0000 mg | ORAL_TABLET | ORAL | Status: DC | PRN
  Administered 2017-02-04: 650 mg via ORAL

## 2017-02-04 MED ORDER — DIPHENHYDRAMINE HCL 25 MG PO CAPS
ORAL_CAPSULE | ORAL | Status: AC
Start: 2017-02-04 — End: 2017-02-04
  Filled 2017-02-04: qty 1

## 2017-02-04 MED ORDER — DIPHENHYDRAMINE HCL 25 MG PO CAPS: 25.0000 mg | ORAL_CAPSULE | Freq: Four times a day (QID) | ORAL | Status: DC | PRN

## 2017-02-04 MED ORDER — ANTICOAGULANT SODIUM CITRATE 4% (200MG/5ML) IV SOLN
5.0000 mL | Freq: Once | Status: AC
  Administered 2017-02-04: 5 mL
  Filled 2017-02-04: qty 250

## 2017-02-04 MED ORDER — POTASSIUM CHLORIDE CRYS ER 20 MEQ PO TBCR
40.0000 meq | EXTENDED_RELEASE_TABLET | Freq: Two times a day (BID) | ORAL | Status: AC
  Administered 2017-02-04 (×2): 40 meq via ORAL
  Filled 2017-02-04 (×2): qty 2

## 2017-02-04 MED ORDER — CALCIUM CARBONATE ANTACID 500 MG PO CHEW
CHEWABLE_TABLET | ORAL | Status: AC
  Filled 2017-02-04: qty 2

## 2017-02-04 MED ORDER — INSULIN ASPART 100 UNIT/ML ~~LOC~~ SOLN
0.0000 [IU] | Freq: Three times a day (TID) | SUBCUTANEOUS | Status: DC
  Administered 2017-02-04: 15 [IU] via SUBCUTANEOUS

## 2017-02-04 NOTE — Progress Notes (Signed)
IP PROGRESS NOTE  Subjective:   Eric Hooper continues to have improvement in the subxiphoid pain. He tolerated the plasmapheresis well. No hives yesterday. No bleeding.  Objective: Vital signs in last 24 hours: Blood pressure (!) 144/89, pulse 61, temperature 98.3 F (36.8 C), temperature source Oral, resp. rate 20, height 5\' 10"  (1.778 m), weight 199 lb 15.3 oz (90.7 kg), SpO2 100 %.  Intake/Output from previous day: 10/10 0701 - 10/11 0700 In: 960 [P.O.:960] Out: 0   Physical Exam: HEENT: No thrush  Abdomen: Nontender, no hepatomegaly Vascular: No leg edema Skin: No rash .  Right IJ pheresis catheter with a gauze dressing  Lab Results:  Recent Labs  02/03/17 0250 02/03/17 1238 02/04/17 0421  WBC 12.5*  --  13.7*  HGB 9.8* 10.5* 9.8*  HCT 28.3* 31.0* 30.0*  PLT 23*  --  36*    BMET  Recent Labs  02/03/17 0250 02/03/17 1238 02/04/17 0421  NA 139 139 140  K 3.9 3.8 3.3*  CL 104 98* 103  CO2 28  --  31  GLUCOSE 166* 202* 129*  BUN 23* 19 13  CREATININE 1.21 0.90 1.06  CALCIUM 9.0  --  8.7*   Bilirubin 1.7, AST 27, LDH 322  Studies/Results: Ir Fluoro Guide Cv Line Right  Result Date: 02/02/2017 CLINICAL DATA:  Thrombocytopenia, TTP and need for non tunneled dialysis catheter for plasmapheresis. EXAM: NON-TUNNELED CENTRAL VENOUS CATHETER PLACEMENT WITH ULTRASOUND AND FLUOROSCOPIC GUIDANCE FLUOROSCOPY TIME:  12 seconds.  1.0 mGy. PROCEDURE: The procedure, risks, benefits, and alternatives were explained to the patient. Questions regarding the procedure were encouraged and answered. The patient understands and consents to the procedure. A time-out was performed prior to initiating the procedure. Ultrasound was utilized to confirm patency of the right internal jugular vein. The right neck and chest were prepped with chlorhexidine in a sterile fashion, and a sterile drape was applied covering the operative field. Maximum barrier sterile technique with sterile gowns  and gloves were used for the procedure. Local anesthesia was provided with 1% lidocaine. After creating a small venotomy incision, a 19 gauge needle was advanced into the right internal jugular vein under direct, real-time ultrasound guidance. Ultrasound image documentation was performed. After securing guidewire access, venous access was dilated. A 20 cm, 12 French triple lumen Mahurkar non tunneled catheter was then advanced over the wire. Final catheter positioning was confirmed and documented with a fluoroscopic spot image. The catheter was aspirated, flushed with saline, and injected with appropriate volume heparin dwells. The catheter exit site was secured with a 0-Prolene retention suture. COMPLICATIONS: None.  No pneumothorax. FINDINGS: After catheter placement, the tip lies at the cavoatrial junction. The catheter aspirates normally and is ready for immediate use. IMPRESSION: Placement of non-tunneled HD/pheresis catheter via the right internal jugular vein. The catheter tip lies at the cavoatrial junction. The catheter is ready for immediate use. Electronically Signed   By: Aletta Edouard M.D.   On: 02/02/2017 09:45   Ir US Guide Vasc Access Right  Result Date: 02/02/2017 CLINICAL DATA:  Thrombocytopenia, TTP and need for non tunneled dialysis catheter for plasmapheresis. EXAM: NON-TUNNELED CENTRAL VENOUS CATHETER PLACEMENT WITH ULTRASOUND AND FLUOROSCOPIC GUIDANCE FLUOROSCOPY TIME:  12 seconds.  1.0 mGy. PROCEDURE: The procedure, risks, benefits, and alternatives were explained to the patient. Questions regarding the procedure were encouraged and answered. The patient understands and consents to the procedure. A time-out was performed prior to initiating the procedure. Ultrasound was utilized to confirm patency of the  right internal jugular vein. The right neck and chest were prepped with chlorhexidine in a sterile fashion, and a sterile drape was applied covering the operative field. Maximum barrier  sterile technique with sterile gowns and gloves were used for the procedure. Local anesthesia was provided with 1% lidocaine. After creating a small venotomy incision, a 19 gauge needle was advanced into the right internal jugular vein under direct, real-time ultrasound guidance. Ultrasound image documentation was performed. After securing guidewire access, venous access was dilated. A 20 cm, 12 French triple lumen Mahurkar non tunneled catheter was then advanced over the wire. Final catheter positioning was confirmed and documented with a fluoroscopic spot image. The catheter was aspirated, flushed with saline, and injected with appropriate volume heparin dwells. The catheter exit site was secured with a 0-Prolene retention suture. COMPLICATIONS: None.  No pneumothorax. FINDINGS: After catheter placement, the tip lies at the cavoatrial junction. The catheter aspirates normally and is ready for immediate use. IMPRESSION: Placement of non-tunneled HD/pheresis catheter via the right internal jugular vein. The catheter tip lies at the cavoatrial junction. The catheter is ready for immediate use. Electronically Signed   By: Aletta Edouard M.D.   On: 02/02/2017 09:45    Medications: I have reviewed the patient's current medications.  Assessment/Plan:  1. TTP  Initiation of daily plasma exchange and Solu-Medrol 02/02/2017 2. Xiphoid pain-etiology unclear, improved 3. Hypertension 4. Diabetes 5. Alcohol/Tobacco use 6. Renal insufficiency 7. Right IJ dialysis catheter placed 02/02/2017  Mr. Bouvier appears stable.  The platelet count is higher today and the bilirubin/LDH continue to improve. The plan is to continue daily plasmapheresis. He will receive Benadryl premedication for the plasmapheresis.  Recommendations: 1. Continue Solu-Medrol and daily plasmapheresis 2. Daily CBC,cmet, and LDH-labs can be drawn in hemodialysis   Donneta Romberg, MD   02/04/2017, 9:04 AM

## 2017-02-04 NOTE — Progress Notes (Signed)
PROGRESS NOTE    Eric Hooper  ZES:[id number] DOB: [birth year] DOA: 02/01/2017 PCP: Patient, No Pcp Per   Brief Narrative:  Eric Hooper is a 41 y.o. AA male with a PMH of GERD, Hypertension, apparently c/o epigastric pain x2 years along with other comorbids who presented to the ED for Epigastric/Subxiphoid Pan. Patient found to have platelets of 23 concerning for TTP. Oncology consulted and started IV Steroids and Plasmapheresis. Patient feels as if mid-epigastric/subxiphoid pain is improved.Tolerating Plasmapheresis with no problems.    Assessment & Plan:   Principal Problem:   Thrombocytopenia (HCC) Active Problems:   Hypokalemia   Macrocytic anemia   Hyperglycemia   Microscopic hematuria   Alcohol use   Renal insufficiency   Leukocytosis  Acute Thrombocytopenia -Concernd for TTP. Platelet Count is now 36K from 21K -Undergoing plasmapheresis. DAT negative. -Oncology Recommendations: Daily Plasmapheresis with premedication of Benadryl, Continued IV Solumedrol (1 mg/kg), Daily LDH/CBC/CMP -LDH went from 1,010 -> 491 -> 322; T Bili Went from 4.1 -> 2.8 -> 1.7 -HIV and Hepatitis Panel Negative  -RBC folate, [name] pending -Repeat CBC with Differential in AM   Abdominal Pain/SubXiphoid Pain -Lipase was 69 on Admission; Will repeat Lipase Level in AM  (pending) -Chronic; Improved -Obtain medical records from [place] -Continue PPI with Pantoprazole 40 mg po Daily and with Tramadol 50 mg po q6hprn  -If not improving will consider a GI Consultation   Hypokalemia -K+ went from 3.0 -> 3.4 -> 3.9 -> 3.3 -Replete with po KCl 40 mEQ po BID -Continue to Monitor and Replete as Necessary -Repeat CMP in AM   Leukocytosis -WBC went from 9.0 -> 12.5 -> 13.7 -Likely from IV Steroid Demargination -Continue to Monitor for S/Sx of Infection -Repeat CBC in AM   Hyperglycemia likely from Steroid Demargination -CBG's ranging from 156-300 -Hemoglobin A1C was <4.2 -Started Patient on  Moderate Novolog SSI AC/HS -Continue to Monitor CBG's   Essential Hypertension -Continue Amlodipine 5 mg po BID -C/w Clonidine 0.1 mg po q6hprn for SBP>160  Alcohol Use -Continue CIWA with Lorazepam -C/w Folic Acid 1 mg, MVI, and Thiamine  -UDS was Negative   Microscopic Hematuria -No signs/symptoms of UTI. Possibly related to ?TTP.  -PSA obtained on admission and below reference range at 0.50 -Recommend repeat Urinalysis as an outpatient  Macrocytic Anemia -Patient's Hb/Hct went from 10.2/31.0 -> 9.8/28.3 -> 9.8/30.0 -Continue to Monitor for S/Sx of Bleeding -Repeat CBC with Diff in AM   Mild Renal Insuffiencey/AKI, improved  -BUN/Cr went from 16/0.90 -> 23/1.21 -> 13/1.06 -Continue to Monitor and repeat CMP in AM as patient has concern for TTP -Avoid Nephrotoxics -Started gentle IVF Rehydration with NS at 75 mL/hr x 10 hours yesterday  DVT prophylaxis: SCDs Code Status: FULL CODE Family Communication: Discussed with Wife at Bedside Disposition Plan: Remain Inpatient for continued Plasmapheresis   Consultants:   Hematology/Oncology  Nephrology  Interventional Radiology placed IJ on 02/02/17   Procedures:  Plasmapheresis with Right IJ Dialysis Catheter placed 02/01/17  Antimicrobials:  Anti-infectives    None     Subjective: Seen and examined in the Dialysis Unit getting Plasmapheresis and had no complaints. No CP or SOB.   Objective: Vitals:   02/04/17 1234 02/04/17 1240 02/04/17 1247 02/04/17 1255  BP: (!) 132/56 127/69 (!) 125/56 119/85  Pulse: 69 82 88 85  Resp: 18 19 18  (!) 23  Temp: 98.2 F (36.8 C) 98 F (36.7 C) 98.2 F (36.8 C) 98 F (36.7 C)  TempSrc: Oral Oral Oral Oral  SpO2: 100% 100% 100% 100%  Weight:      Height:        Intake/Output Summary (Last 24 hours) at 02/04/17 1312 Last data filed at 02/04/17 1020  Gross per 24 hour  Intake              480 ml  Output                0 ml  Net              480 ml   Filed Weights    02/02/17 2021 02/03/17 2048 02/04/17 0945  Weight: 90.5 kg (199 lb 8.3 oz) 90.7 kg (199 lb 15.3 oz) 90.7 kg (199 lb 15.3 oz)   Examination: Physical Exam:  Constitutional: WN/WD AAM in NAD sitting up in bed getting plasmapheresis  Eyes: Sclerae anicteric. Conjunctivae non-injected ENMT: External Ears and nose appear normal. MMM Neck: Has Right IJ HD Cath. No appreciable JVD. Supple Respiratory: CTAB; No wheezing/rales/rhonchi. Cardiovascular: RRR, no m/r/g. No extremity edema Abdomen: Soft, NT, ND. Bowel sounds present GU: Deferred Musculoskeletal: No contractures; No cyanosis Skin: Warm and Dry. No rashes or lesions on a limited skin eval; Has Right IJ HD cath Neurologic: CN 2-12 grossly intact. No appreciable focal deficits Psychiatric: Pleasant mood and affect. Intact judgement and insight  Data Reviewed: I have personally reviewed following labs and imaging studies  CBC:  Recent Labs Lab 02/01/17 1337 02/01/17 2058  02/02/17 0422 02/02/17 1121 02/03/17 0250 02/03/17 1238 02/04/17 0421  WBC 8.6  --   --  9.0  --  12.5*  --  13.7*  NEUTROABS  --   --   --   --   --   --   --  8.1*  HGB 10.9*  --   --  10.2* 9.5* 9.8* 10.5* 9.8*  HCT 32.3*  --   < > 31.0* 28.0* 28.3* 31.0* 30.0*  MCV 105.9*  --   --  106.5*  --  106.0*  --  107.9*  PLT 23* 24*  --  21*  --  23*  --  36*  < > = values in this interval not displayed. Basic Metabolic Panel:  Recent Labs Lab 02/01/17 1337 02/02/17 0422 02/02/17 1121 02/03/17 0250 02/03/17 1238 02/04/17 0421  NA 140 139 141 139 139 140  K 3.1* 3.0* 3.4* 3.9 3.8 3.3*  CL 105 102 101 104 98* 103  CO2 27 29  --  28  --  31  GLUCOSE 112* 118* 119* 166* 202* 129*  BUN 19 17 16  23* 19 13  CREATININE 1.25* 1.12 0.90 1.21 0.90 1.06  CALCIUM 9.7 9.0  --  9.0  --  8.7*   GFR: Estimated Creatinine Clearance: 101.8 mL/min (by C-G formula based on SCr of 1.06 mg/dL). Liver Function Tests:  Recent Labs Lab 02/01/17 1337 02/01/17 2058  02/02/17 0422 02/03/17 0250 02/04/17 0421  AST 52*  --  52* 33 27  ALT 31  --  35 25 28  ALKPHOS 99  --  86 56 57  BILITOT 3.9* 4.1* 3.9* 2.8* 1.7*  PROT 6.4*  --  6.1* 5.5* 5.6*  ALBUMIN 4.0  --  3.9 3.4* 3.3*    Recent Labs Lab 02/01/17 1337  LIPASE 69*   No results for input(s): AMMONIA in the last 168 hours. Coagulation Profile:  Recent Labs Lab 02/01/17 1647  INR 1.10   Cardiac Enzymes: No results for input(s): CKTOTAL, CKMB, CKMBINDEX, TROPONINI in the  last 168 hours. BNP (last 3 results) No results for input(s): PROBNP in the last 8760 hours. HbA1C:  Recent Labs  02/01/17 2316  HGBA1C <4.2*   CBG:  Recent Labs Lab 02/02/17 2020 02/03/17 0824 02/03/17 1659 02/03/17 2043 02/04/17 0753  GLUCAP 186* 132* 289* 300* 156*   Lipid Profile: No results for input(s): CHOL, HDL, LDLCALC, TRIG, CHOLHDL, LDLDIRECT in the last 72 hours. Thyroid Function Tests: No results for input(s): TSH, T4TOTAL, FREET4, T3FREE, THYROIDAB in the last 72 hours. Anemia Panel:  Recent Labs  02/01/17 2058  RETICCTPCT 11.2*   Sepsis Labs: No results for input(s): PROCALCITON, LATICACIDVEN in the last 168 hours.  No results found for this or any previous visit (from the past 240 hour(s)).   Radiology Studies: No results found. Scheduled Meds: . amLODipine  5 mg Oral BID  . calcium carbonate      . calcium carbonate      . diphenhydrAMINE      . diphenhydrAMINE  25 mg Oral Daily  . folic acid  1 mg Oral Daily  . insulin aspart  0-15 Units Subcutaneous TID WC  . insulin aspart  0-5 Units Subcutaneous QHS  . LORazepam  0-4 mg Intravenous Q12H  . methylPREDNISolone (SOLU-MEDROL) injection  1 mg/kg Intravenous Daily  . multivitamin with minerals  1 tablet Oral Daily  . pantoprazole  40 mg Oral Daily  . potassium chloride  40 mEq Oral BID  . thiamine  100 mg Oral Daily   Or  . thiamine  100 mg Intravenous Daily   Continuous Infusions: . calcium gluconate IVPB 2 g  (02/04/17 1231)  . citrate dextrose      LOS: 3 days   Kerney Elbe, DO [facility] Pager [phone]  If 7PM-7AM, please contact night-coverage [email or web address] Password [email or web address] 02/04/2017, 1:12 PM

## 2017-02-05 DIAGNOSIS — R05 Cough: Secondary | ICD-10-CM

## 2017-02-05 DIAGNOSIS — R059 Cough, unspecified: Secondary | ICD-10-CM

## 2017-02-05 LAB — THERAPEUTIC PLASMA EXCHANGE (BLOOD BANK)
Plasma volume needed: 3390
UNIT DIVISION: 0
UNIT DIVISION: 0
UNIT DIVISION: 0
UNIT DIVISION: 0
UNIT DIVISION: 0
UNIT DIVISION: 0
UNIT DIVISION: 0
Unit division: 0
Unit division: 0
Unit division: 0
Unit division: 0
Unit division: 0

## 2017-02-05 LAB — CBC
HEMATOCRIT: 29.8 % — AB (ref 39.0–52.0)
HEMOGLOBIN: 9.5 g/dL — AB (ref 13.0–17.0)
MCH: 34.3 pg — AB (ref 26.0–34.0)
MCHC: 31.9 g/dL (ref 30.0–36.0)
MCV: 107.6 fL — AB (ref 78.0–100.0)
Platelets: 81 10*3/uL — ABNORMAL LOW (ref 150–400)
RBC: 2.77 MIL/uL — ABNORMAL LOW (ref 4.22–5.81)
RDW: 17.2 % — AB (ref 11.5–15.5)
WBC: 15.5 10*3/uL — ABNORMAL HIGH (ref 4.0–10.5)

## 2017-02-05 LAB — COMPREHENSIVE METABOLIC PANEL
ALBUMIN: 3.3 g/dL — AB (ref 3.5–5.0)
ALK PHOS: 61 U/L (ref 38–126)
ALT: 35 U/L (ref 17–63)
ANION GAP: 7 (ref 5–15)
AST: 25 U/L (ref 15–41)
BUN: 13 mg/dL (ref 6–20)
CO2: 29 mmol/L (ref 22–32)
Calcium: 9 mg/dL (ref 8.9–10.3)
Chloride: 103 mmol/L (ref 101–111)
Creatinine, Ser: 0.98 mg/dL (ref 0.61–1.24)
GFR calc Af Amer: >60 mL/min (ref >60)
GFR calc non Af Amer: >60 mL/min (ref >60)
GLUCOSE: 178 mg/dL — AB (ref 65–99)
POTASSIUM: 3.7 mmol/L (ref 3.5–5.1)
SODIUM: 139 mmol/L (ref 135–145)
Total Bilirubin: 0.9 mg/dL (ref 0.3–1.2)
Total Protein: 5.4 g/dL — ABNORMAL LOW (ref 6.5–8.1)

## 2017-02-05 LAB — GLUCOSE, CAPILLARY
GLUCOSE-CAPILLARY: 123 mg/dL — AB (ref 65–99)
Glucose-Capillary: 216 mg/dL — ABNORMAL HIGH (ref 65–99)
Glucose-Capillary: 239 mg/dL — ABNORMAL HIGH (ref 65–99)

## 2017-02-05 LAB — LACTATE DEHYDROGENASE: LDH: 222 U/L — ABNORMAL HIGH (ref 98–192)

## 2017-02-05 MED ORDER — PREDNISONE 50 MG PO TABS: 80.0000 mg | ORAL_TABLET | Freq: Every day | ORAL | Status: DC

## 2017-02-05 MED ORDER — SODIUM CHLORIDE 0.9 % IV SOLN
2.0000 g | Freq: Once | INTRAVENOUS | Status: AC
  Administered 2017-02-05: 2 g via INTRAVENOUS
  Filled 2017-02-05: qty 20

## 2017-02-05 MED ORDER — CALCIUM CARBONATE ANTACID 500 MG PO CHEW
2.0000 | CHEWABLE_TABLET | ORAL | Status: AC
  Administered 2017-02-05: 400 mg via ORAL
  Filled 2017-02-05 (×2): qty 2

## 2017-02-05 MED ORDER — CALCIUM CARBONATE ANTACID 500 MG PO CHEW
CHEWABLE_TABLET | ORAL | Status: AC
  Filled 2017-02-05: qty 2

## 2017-02-05 MED ORDER — TRAZODONE HCL 50 MG PO TABS
50.0000 mg | ORAL_TABLET | Freq: Every day | ORAL | Status: DC
  Administered 2017-02-05 – 2017-02-06 (×2): 50 mg via ORAL
  Filled 2017-02-05 (×2): qty 1

## 2017-02-05 MED ORDER — ACETAMINOPHEN 325 MG PO TABS: 650.0000 mg | ORAL_TABLET | ORAL | Status: DC | PRN

## 2017-02-05 MED ORDER — INSULIN ASPART 100 UNIT/ML ~~LOC~~ SOLN
0.0000 [IU] | Freq: Every day | SUBCUTANEOUS | Status: DC
  Administered 2017-02-05 – 2017-02-06 (×2): 2 [IU] via SUBCUTANEOUS

## 2017-02-05 MED ORDER — IPRATROPIUM-ALBUTEROL 0.5-2.5 (3) MG/3ML IN SOLN
3.0000 mL | Freq: Four times a day (QID) | RESPIRATORY_TRACT | Status: DC
  Administered 2017-02-05 (×2): 3 mL via RESPIRATORY_TRACT
  Filled 2017-02-05 (×2): qty 3

## 2017-02-05 MED ORDER — ANTICOAGULANT SODIUM CITRATE 4% (200MG/5ML) IV SOLN
5.0000 mL | Freq: Once | Status: AC
  Administered 2017-02-05: 5 mL
  Filled 2017-02-05: qty 250

## 2017-02-05 MED ORDER — ACD FORMULA A 0.73-2.45-2.2 GM/100ML VI SOLN
Status: AC
  Administered 2017-02-05: 500 mL via INTRAVENOUS
  Filled 2017-02-05: qty 500

## 2017-02-05 MED ORDER — ACD FORMULA A 0.73-2.45-2.2 GM/100ML VI SOLN
500.0000 mL | Status: DC
  Administered 2017-02-06: 500 mL via INTRAVENOUS

## 2017-02-05 MED ORDER — INSULIN ASPART 100 UNIT/ML ~~LOC~~ SOLN
0.0000 [IU] | Freq: Three times a day (TID) | SUBCUTANEOUS | Status: DC
Start: 2017-02-05 — End: 2017-02-07
  Administered 2017-02-05: 7 [IU] via SUBCUTANEOUS
  Administered 2017-02-06: 4 [IU] via SUBCUTANEOUS
  Administered 2017-02-06: 11 [IU] via SUBCUTANEOUS
  Administered 2017-02-07: 7 [IU] via SUBCUTANEOUS
  Administered 2017-02-07: 3 [IU] via SUBCUTANEOUS

## 2017-02-05 MED ORDER — PREDNISONE 50 MG PO TABS
80.0000 mg | ORAL_TABLET | Freq: Every day | ORAL | Status: DC
  Administered 2017-02-06 – 2017-02-07 (×2): 80 mg via ORAL
  Filled 2017-02-05 (×2): qty 1

## 2017-02-05 MED ORDER — DIPHENHYDRAMINE HCL 25 MG PO CAPS: 25.0000 mg | ORAL_CAPSULE | Freq: Four times a day (QID) | ORAL | Status: DC | PRN

## 2017-02-05 NOTE — Progress Notes (Addendum)
Results for WILKINS, ELPERS (MRN [id number]) as of 02/05/2017 13:26  Ref. Range 02/03/2017 16:59 02/03/2017 20:43 02/04/2017 07:53 02/04/2017 16:59 02/04/2017 20:43  Glucose-Capillary Latest Ref Range: 65 - 99 mg/dL 289 (H) 300 (H) 156 (H) 393 (H) 240 (H)  Received diabetes coordinator consult. Having elevated CBGs due to steroid use. Noted that patient has Type 2 diabetes. HgbA1C is <4.2%, but hemoglobin is 9.5 which can cause A1C to be abnormal. Noted that CBGs have been greater than 200 mg/dl. Does not take any medications for diabetes at home.  Recommend starting Novolog RESISTANT correction scale TID & HS while in the hospital and while on steroids. Will continue to monitor blood sugars.  Harvel Ricks RN BSN CDE Diabetes Coordinator Pager: [phone]  8am-5pm

## 2017-02-05 NOTE — Progress Notes (Signed)
Paged Dr. Alfredia Ferguson, pt is insistent on regular diet.  Family is bringing in food.  Pt refuses blood sugar checks.  Educated pt on importance of sticking to carb mod diet.

## 2017-02-05 NOTE — Progress Notes (Signed)
Pt gone down for plasma pheresis via bed.

## 2017-02-05 NOTE — Progress Notes (Signed)
IP PROGRESS NOTE  Subjective:   Eric Hooper tolerated plasmapheresis well yesterday. No hives or pruritus. He reports a mild cough last night. He has mild exertional dyspnea. Mild subxiphoid discomfort.  Objective: Vital signs in last 24 hours: Blood pressure (!) 156/93, pulse 74, temperature 98.5 F (36.9 C), temperature source Oral, resp. rate 18, height 5\' 10"  (1.778 m), weight 200 lb 13.4 oz (91.1 kg), SpO2 100 %.  Intake/Output from previous day: 10/11 0701 - 10/12 0700 In: 1250 [P.O.:480; IV Piggyback:770] Out: -   Physical Exam: HEENT: No thrush Lungs: Clear bilaterally, no respiratory distress Cardiac: Regular rate and rhythm Abdomen:mild tenderness in the subxiphoid region, no hepatomegaly Vascular: No leg edema Skin: No rash  Right IJ pheresis catheter with a gauze dressing  Lab Results:  Recent Labs  02/04/17 0421 02/04/17 1020 02/05/17 0203  WBC 13.7*  --  15.5*  HGB 9.8* 9.2* 9.5*  HCT 30.0* 27.0* 29.8*  PLT 36*  --  81*    BMET  Recent Labs  02/04/17 0421 02/04/17 1020 02/05/17 0203  NA 140 144 139  K 3.3* 3.4* 3.7  CL 103 95* 103  CO2 31  --  29  GLUCOSE 129* 209* 178*  BUN 13 12 13   CREATININE 1.06 0.90 0.98  CALCIUM 8.7*  --  9.0   Bilirubin 0.9,LDH 222, AST 25  Medications: I have reviewed the patient's current medications.  Assessment/Plan:  1. TTP  Initiation of daily plasma exchange and Solu-Medrol 02/02/2017  [name] Antibody- 72, activity less than 2% 2. Xiphoid pain-etiology unclear, improved 3. Hypertension 4. Diabetes 5. Alcohol/Tobacco use 6. Renal insufficiency 7. Right IJ dialysis catheter placed 02/02/2017  Eric Hooper appears unchanged. The hemolysis and thrombocytopenia are responding to plasma exchange and Solu-Medrol. I recommend continuing daily plasma exchange until the platelet count has normalized. He can then completean outpatient steroid taper.  Recommendations: 1. Continue Solu-Medrol and daily  plasmapheresis until platelet count normalizes 2. Daily CBC,cmet, and LDH-labs can be drawn in hemodialysis 3. Convert to prednisone with plan for outpatient steroid taper 4. Hematology will continue following him daily and we will arrange for an outpatient appointment   Donneta Romberg, MD   02/05/2017, 10:24 AM

## 2017-02-05 NOTE — Progress Notes (Signed)
PROGRESS NOTE    Eric Hooper  SHF:[id number] DOB: 1975/05/30 DOA: 02/01/2017 PCP: Patient, No Pcp Per   Brief Narrative:  Eric Hooper is a 41 y.o. AA male with a PMH of GERD, Hypertension, apparently c/o epigastric pain x2 years along with other comorbids who presented to the ED for Epigastric/Subxiphoid Pan. Patient found to have platelets of 23 concerning for TTP. Oncology consulted and started IV Steroids and Plasmapheresis. Patient feels as if mid-epigastric/subxiphoid pain is improved.Tolerating Plasmapheresis with no problems. Oncology recommending daily plasmapheresis until platelet count normalizes and changed IV Steroids to po Prednisone with plan for outpatient Steroid Taper.   Assessment & Plan:   Principal Problem:   Thrombocytopenia ([place]) Active Problems:   Hypokalemia   Macrocytic anemia   Hyperglycemia   Microscopic hematuria   Alcohol use   Renal insufficiency   Leukocytosis  Acute Thrombocytopenia 2/2 to TTP -Concernd for TTP. Platelet Count is now 81K from 21K -Undergoing plasmapheresis. DAT negative. -Oncology Recommendations: Daily Plasmapheresis with premedication of Benadryl, IV Solumedrol (1 mg/kg) change to po Prednisone and now patient is on 80 mg po Daily (Also on Pantoprazole 40 mg po Daily), Daily LDH/CBC/CMP -LDH went from 1,010 -> 491 -> 322 -> 222; T Bili Went from 4.1 -> 2.8 -> 1.7 -> 0.9 -HIV and Hepatitis Panel Negative  -RBC folate, [name] Activity was <2.0 and [name] Ab was 72 -Repeat CBC with Differential in AM   Abdominal Pain/SubXiphoid Pain -Lipase was 69 on Admission; Repeat Lipase was 47 -Chronic; Improved -Obtain medical records from [place] -Continue PPI with Pantoprazole 40 mg po Daily and with Tramadol 50 mg po q6hprn  -If not improving will consider a GI Consultation   Hypokalemia, improved -K+ now 3.7 -Continue to Monitor and Replete as Necessary -Repeat CMP in AM   Leukocytosis -WBC went from 9.0 -> 12.5 -> 13.7 ->  15.5 -Likely from IV Steroid Demargination -Continue to Monitor for S/Sx of Infection -Repeat CBC in AM   Hyperglycemia likely from Steroid Demargination -CBG's ranging from 156-393 -Hemoglobin A1C was <4.2 -Started Patient on Moderate Novolog SSI AC/HS and will increase to Resistant  -Will Consult Diabetes Education Coordinator  -Continue to Monitor CBG's   Essential Hypertension -Continue Amlodipine 5 mg po BID -C/w Clonidine 0.1 mg po q6hprn for SBP>160  Alcohol Use -Continue CIWA with Lorazepam -C/w Folic Acid 1 mg, MVI, and Thiamine  -UDS was Negative   Microscopic Hematuria -No signs/symptoms of UTI. Possibly related to ?TTP.  -PSA obtained on admission and below reference range at 0.50 -Recommend repeat Urinalysis as an outpatient  Macrocytic Anemia -Patient's Hb/Hct went from 10.2/31.0 -> 9.8/28.3 -> 9.8/30.0 -> 9.5/29.8 -Continue to Monitor for S/Sx of Bleeding -Repeat CBC with Diff in AM   Mild Renal Insuffiencey/AKI, improved  -BUN/Cr went from 16/0.90 -> 23/1.21 -> 13/1.06 -> 13/0.98 -Continue to Monitor and repeat CMP in AM as patient has concern for TTP -Avoid Nephrotoxics -Improved -Repeat CMP in AM   Cough -Not Productive -Added DuoNebs 3 mL q6h; C/w Albuterol Neb 3 mL IH q6hprn -If persists add Anti-tussive -Repeat CXR in AM   DVT prophylaxis: SCDs Code Status: FULL CODE Family Communication: Discussed with Wife at Bedside Disposition Plan: Remain Inpatient for continued Plasmapheresis   Consultants:   Hematology/Oncology  Nephrology  Interventional Radiology placed IJ on 02/02/17   Procedures:  Plasmapheresis with Right IJ Dialysis Catheter placed 02/02/17  Antimicrobials:  Anti-infectives    None     Subjective: Seen and examined this AM and  was doing well. States he had a dry cough. No CP or SOB. No other concerns or complaints at this time.   Objective: Vitals:   02/05/17 1127 02/05/17 1138 02/05/17 1149 02/05/17 1203  BP:  135/65 (!) 144/95 139/85 (!) 148/96  Pulse: 64 67 71 69  Resp: 19 18 19 19   Temp: (!) 97.3 F (36.3 C) 98 F (36.7 C) (!) 97.2 F (36.2 C) (!) 97 F (36.1 C)  TempSrc:    Oral  SpO2: 100% 100% 100%   Weight:      Height:        Intake/Output Summary (Last 24 hours) at 02/05/17 1228 Last data filed at 02/04/17 2200  Gross per 24 hour  Intake             1010 ml  Output                0 ml  Net             1010 ml   Filed Weights   02/04/17 0945 02/04/17 1320 02/04/17 2046  Weight: 90.7 kg (199 lb 15.3 oz) 91.1 kg (200 lb 13.4 oz) 91.1 kg (200 lb 13.4 oz)   Examination: Physical Exam:  Constitutional: WN/WD AAM in NAD appears calm and comfortable Eyes: Sclerae anicteric. Conjunctivae non-injected ENMT: External Ears and nose appear normal. MMM Neck: Supple with no JVD Respiratory: Diminished with posterior scattered crackles. No wheezing or rhonchi. Patient was not tachypenic or using any accessory muscles to breathe Cardiovascular: RRR; No m/r/g. No extremity edema Abdomen: Soft, NT, ND. Bowel sounds present GU: Deferred Musculoskeletal: No contractures; No Cyanosis Skin: Warm and Dry. No rashes or lesions on a limited skin eval. Has Right IJ HD Cath in place Neurologic: CN 2-12 grossly intact. No appreciable focal deficits. Psychiatric: Pleasant mood and affect. Intact judgement and insight  Data Reviewed: I have personally reviewed following labs and imaging studies  CBC:  Recent Labs Lab 02/01/17 1337 02/01/17 2058  02/02/17 0422  02/03/17 0250 02/03/17 1238 02/04/17 0421 02/04/17 1020 02/05/17 0203  WBC 8.6  --   --  9.0  --  12.5*  --  13.7*  --  15.5*  NEUTROABS  --   --   --   --   --   --   --  8.1*  --   --   HGB 10.9*  --   --  10.2*  < > 9.8* 10.5* 9.8* 9.2* 9.5*  HCT 32.3*  --   < > 31.0*  < > 28.3* 31.0* 30.0* 27.0* 29.8*  MCV 105.9*  --   --  106.5*  --  106.0*  --  107.9*  --  107.6*  PLT 23* 24*  --  21*  --  23*  --  36*  --  81*  < > =  values in this interval not displayed. Basic Metabolic Panel:  Recent Labs Lab 02/01/17 1337 02/02/17 0422  02/03/17 0250 02/03/17 1238 02/04/17 0421 02/04/17 1020 02/05/17 0203  NA 140 139  < > 139 139 140 144 139  K 3.1* 3.0*  < > 3.9 3.8 3.3* 3.4* 3.7  CL 105 102  < > 104 98* 103 95* 103  CO2 27 29  --  28  --  31  --  29  GLUCOSE 112* 118*  < > 166* 202* 129* 209* 178*  BUN 19 17  < > 23* 19 13 12 13   CREATININE 1.25* 1.12  < >  1.21 0.90 1.06 0.90 0.98  CALCIUM 9.7 9.0  --  9.0  --  8.7*  --  9.0  < > = values in this interval not displayed. GFR: Estimated Creatinine Clearance: 110.3 mL/min (by C-G formula based on SCr of 0.98 mg/dL). Liver Function Tests:  Recent Labs Lab 02/01/17 1337 02/01/17 2058 02/02/17 0422 02/03/17 0250 02/04/17 0421 02/05/17 0203  AST 52*  --  52* 33 27 25  ALT 31  --  35 25 28 35  ALKPHOS 99  --  86 56 57 61  BILITOT 3.9* 4.1* 3.9* 2.8* 1.7* 0.9  PROT 6.4*  --  6.1* 5.5* 5.6* 5.4*  ALBUMIN 4.0  --  3.9 3.4* 3.3* 3.3*    Recent Labs Lab 02/01/17 1337 02/04/17 0421  LIPASE 69* 47   No results for input(s): AMMONIA in the last 168 hours. Coagulation Profile:  Recent Labs Lab 02/01/17 1647  INR 1.10   Cardiac Enzymes: No results for input(s): CKTOTAL, CKMB, CKMBINDEX, TROPONINI in the last 168 hours. BNP (last 3 results) No results for input(s): PROBNP in the last 8760 hours. HbA1C: No results for input(s): HGBA1C in the last 72 hours. CBG:  Recent Labs Lab 02/03/17 1659 02/03/17 2043 02/04/17 0753 02/04/17 1659 02/04/17 2043  GLUCAP 289* 300* 156* 393* 240*   Lipid Profile: No results for input(s): CHOL, HDL, LDLCALC, TRIG, CHOLHDL, LDLDIRECT in the last 72 hours. Thyroid Function Tests: No results for input(s): TSH, T4TOTAL, FREET4, T3FREE, THYROIDAB in the last 72 hours. Anemia Panel: No results for input(s): VITAMINB12, FOLATE, FERRITIN, TIBC, IRON, RETICCTPCT in the last 72 hours. Sepsis Labs: No results  for input(s): PROCALCITON, LATICACIDVEN in the last 168 hours.  No results found for this or any previous visit (from the past 240 hour(s)).   Radiology Studies: No results found. Scheduled Meds: . amLODipine  5 mg Oral BID  . calcium carbonate      . calcium carbonate  2 tablet Oral Q3H  . diphenhydrAMINE  25 mg Oral Daily  . folic acid  1 mg Oral Daily  . insulin aspart  0-15 Units Subcutaneous TID WC  . insulin aspart  0-5 Units Subcutaneous QHS  . LORazepam  0-4 mg Intravenous Q12H  . multivitamin with minerals  1 tablet Oral Daily  . pantoprazole  40 mg Oral Daily  . [START ON 02/06/2017] predniSONE  80 mg Oral Q breakfast  . thiamine  100 mg Oral Daily   Continuous Infusions: . anticoagulant sodium citrate    . calcium gluconate IVPB 2 g (02/05/17 1157)  . citrate dextrose      LOS: 4 days   Kerney Elbe, DO [facility] Pager [phone]  If 7PM-7AM, please contact night-coverage [email or web address] Password [facility] 02/05/2017, 12:28 PM

## 2017-02-06 ENCOUNTER — Inpatient Hospital Stay (HOSPITAL_COMMUNITY): Payer: BLUE CROSS/BLUE SHIELD

## 2017-02-06 DIAGNOSIS — M311 Thrombotic microangiopathy: Secondary | ICD-10-CM

## 2017-02-06 DIAGNOSIS — M3119 Other thrombotic microangiopathy: Secondary | ICD-10-CM

## 2017-02-06 LAB — CBC
HCT: 30.6 % — ABNORMAL LOW (ref 39.0–52.0)
Hemoglobin: 10.3 g/dL — ABNORMAL LOW (ref 13.0–17.0)
MCH: 35.9 pg — ABNORMAL HIGH (ref 26.0–34.0)
MCHC: 33.7 g/dL (ref 30.0–36.0)
MCV: 106.6 fL — ABNORMAL HIGH (ref 78.0–100.0)
PLATELETS: 147 10*3/uL — AB (ref 150–400)
RBC: 2.87 MIL/uL — ABNORMAL LOW (ref 4.22–5.81)
RDW: 16.4 % — AB (ref 11.5–15.5)
WBC: 9.9 10*3/uL (ref 4.0–10.5)

## 2017-02-06 LAB — THERAPEUTIC PLASMA EXCHANGE (BLOOD BANK)
Plasma Exchange: 3200
Plasma volume needed: 3200
UNIT DIVISION: 0
UNIT DIVISION: 0
UNIT DIVISION: 0
UNIT DIVISION: 0
Unit division: 0
Unit division: 0
Unit division: 0
Unit division: 0
Unit division: 0
Unit division: 0
Unit division: 0
Unit division: 0

## 2017-02-06 LAB — COMPREHENSIVE METABOLIC PANEL
ALT: 51 U/L (ref 17–63)
AST: 32 U/L (ref 15–41)
Albumin: 3.2 g/dL — ABNORMAL LOW (ref 3.5–5.0)
Alkaline Phosphatase: 70 U/L (ref 38–126)
Anion gap: 7 (ref 5–15)
BILIRUBIN TOTAL: 0.8 mg/dL (ref 0.3–1.2)
BUN: 14 mg/dL (ref 6–20)
CO2: 30 mmol/L (ref 22–32)
CREATININE: 1.1 mg/dL (ref 0.61–1.24)
Calcium: 8.5 mg/dL — ABNORMAL LOW (ref 8.9–10.3)
Chloride: 102 mmol/L (ref 101–111)
GFR calc Af Amer: >60 mL/min (ref >60)
Glucose, Bld: 205 mg/dL — ABNORMAL HIGH (ref 65–99)
Potassium: 3.2 mmol/L — ABNORMAL LOW (ref 3.5–5.1)
Sodium: 139 mmol/L (ref 135–145)
TOTAL PROTEIN: 5.2 g/dL — AB (ref 6.5–8.1)

## 2017-02-06 LAB — LACTATE DEHYDROGENASE: LDH: 197 U/L — ABNORMAL HIGH (ref 98–192)

## 2017-02-06 LAB — PHOSPHORUS: Phosphorus: 4.8 mg/dL — ABNORMAL HIGH (ref 2.5–4.6)

## 2017-02-06 LAB — MAGNESIUM: Magnesium: 1.8 mg/dL (ref 1.7–2.4)

## 2017-02-06 LAB — GLUCOSE, CAPILLARY
GLUCOSE-CAPILLARY: 227 mg/dL — AB (ref 65–99)
GLUCOSE-CAPILLARY: 269 mg/dL — AB (ref 65–99)
Glucose-Capillary: 162 mg/dL — ABNORMAL HIGH (ref 65–99)

## 2017-02-06 MED ORDER — POTASSIUM CHLORIDE CRYS ER 20 MEQ PO TBCR
40.0000 meq | EXTENDED_RELEASE_TABLET | Freq: Two times a day (BID) | ORAL | Status: DC
  Administered 2017-02-06 – 2017-02-07 (×2): 40 meq via ORAL
  Filled 2017-02-06 (×2): qty 2

## 2017-02-06 MED ORDER — ANTICOAGULANT SODIUM CITRATE 4% (200MG/5ML) IV SOLN
5.0000 mL | Freq: Once | Status: AC
  Administered 2017-02-06: 5 mL
  Filled 2017-02-06: qty 250

## 2017-02-06 MED ORDER — DIPHENHYDRAMINE HCL 25 MG PO CAPS
25.0000 mg | ORAL_CAPSULE | Freq: Four times a day (QID) | ORAL | Status: DC | PRN
  Administered 2017-02-06: 25 mg via ORAL

## 2017-02-06 MED ORDER — VITAMIN B-1 100 MG PO TABS
100.0000 mg | ORAL_TABLET | Freq: Every day | ORAL | Status: DC
  Administered 2017-02-06 – 2017-02-07 (×2): 100 mg via ORAL

## 2017-02-06 MED ORDER — ACETAMINOPHEN 325 MG PO TABS: 650.0000 mg | ORAL_TABLET | ORAL | Status: DC | PRN

## 2017-02-06 MED ORDER — SODIUM CHLORIDE 0.9 % IV SOLN
4.0000 g | Freq: Once | INTRAVENOUS | Status: AC
  Administered 2017-02-06: 4 g via INTRAVENOUS
  Filled 2017-02-06 (×2): qty 40

## 2017-02-06 MED ORDER — ACD FORMULA A 0.73-2.45-2.2 GM/100ML VI SOLN
500.0000 mL | Status: DC
  Filled 2017-02-06: qty 500

## 2017-02-06 MED ORDER — CALCIUM CARBONATE ANTACID 500 MG PO CHEW
2.0000 | CHEWABLE_TABLET | ORAL | Status: AC
  Administered 2017-02-06: 400 mg via ORAL
  Filled 2017-02-06: qty 2

## 2017-02-06 NOTE — Progress Notes (Signed)
PROGRESS NOTE    Eric Hooper  BSJ:[id number] DOB: May 04, 1975 DOA: 02/01/2017 PCP: Patient, No Pcp Per   Brief Narrative:  Eric Hooper is a 41 y.o. AA male with a PMH of GERD, Hypertension, apparently c/o epigastric pain x2 years along with other comorbids who presented to the ED for Epigastric/Subxiphoid Pan. Patient found to have platelets of 23 concerning for TTP. Oncology consulted and started IV Steroids and Plasmapheresis. Patient feels as if mid-epigastric/subxiphoid pain is improved.Tolerating Plasmapheresis with no problems. Oncology recommending daily plasmapheresis until platelet count normalizes and changed IV Steroids to po Prednisone with plan for outpatient Steroid Taper. Patient undergoing plasmapheresis and Platelet Count almost Normal. Likely D/C tomorrow AM pending Hematology Clearance.   Assessment & Plan:   Principal Problem:   Thrombocytopenia ([place]) Active Problems:   Hypokalemia   Macrocytic anemia   Hyperglycemia   Microscopic hematuria   Alcohol use   Renal insufficiency   Leukocytosis   Cough  Acute Thrombocytopenia 2/2 to TTP -Concernd for TTP. Platelet Count is now 81K from 21K -Undergoing plasmapheresis. DAT negative. -Oncology Recommendations: Daily Plasmapheresis with premedication of Benadryl, IV Solumedrol (1 mg/kg) change to po Prednisone and now patient is on 80 mg po Daily (Also on Pantoprazole 40 mg po Daily), Daily LDH/CBC/CMP -LDH went from 1,010 -> 491 -> 322 -> 222 -> 197  -T Bili Went from 4.1 -> 2.8 -> 1.7 -> 0.9 -> 0.8 -HIV and Hepatitis Panel Negative  -RBC folate, [name] Activity was <2.0 and [name] Ab was 72 -Repeat CBC with Differential in AM and Likely D/C Home in AM  Abdominal Pain/SubXiphoid Pain -Lipase was 69 on Admission; Repeat Lipase was 47 -Chronic; Improved -Obtain medical records from [place] -Continue PPI with Pantoprazole 40 mg po Daily and with Tramadol 50 mg po q6hprn  -If not improving will consider a GI  Consultation   Hypokalemia -K+ now 3.2 -Replete with po KCl 40 mEQ BID -Checkd Mag and was 1.8 -Continue to Monitor and Replete as Necessary -Repeat CMP in AM   Leukocytosis, improved -WBC went from 9.0 -> 12.5 -> 13.7 -> 15.5 -> 9.9 -Likely from Steroid Demargination -Continue to Monitor for S/Sx of Infection -Repeat CBC in AM   Hyperglycemia likely from Steroid Demargination -CBG's ranging from 123-239 -Hemoglobin A1C was <4.2 -Started Patient on Moderate Novolog SSI AC/HS and will increase to Resistant  -Consulted Diabetes Education Coordinator and they agreed with increasing to Resistant Scale -Continue to Monitor CBG's  -Patient is non-compliant with Diet as Family brining patient food and patient refusing CBG draws; Does not want a Carb Modified Diet  Essential Hypertension -Continue Amlodipine 5 mg po BID -C/w Clonidine 0.1 mg po q6hprn for SBP>160  Alcohol Use -Continued CIWA with Lorazepam; Lorazepam now discontinued -C/w Folic Acid 1 mg, MVI, and Thiamine  -UDS was Negative   Microscopic Hematuria -No signs/symptoms of UTI. Possibly related to ?TTP.  -PSA obtained on admission and below reference range at 0.50 -Recommend repeat Urinalysis as an outpatient  Macrocytic Anemia -Patient's Hb/Hct went from 10.2/31.0 -> 9.8/28.3 -> 9.8/30.0 -> 9.5/29.8 -> 10.3/30.6 -Continue to Monitor for S/Sx of Bleeding -Repeat CBC with Diff in AM   Mild Renal Insuffiencey/AKI, stable. Likely has some CKD and suspect CKD Stage 2 -BUN/Cr went from 16/0.90 -> 23/1.21 -> 13/1.06 -> 13/0.98 -> 14/1.10  -Continue to Monitor and repeat CMP in AM as patient has concern for TTP -Avoid Nephrotoxics -Improved -Repeat CMP in AM   Cough -Not Productive and patient describes it  as a dry cough  -Added DuoNebs 3 mL q6h; C/w Albuterol Neb 3 mL IH q6hprn -If persists add Anti-tussive -Repeat CXR this AM showed no evidence of active disease   DVT prophylaxis: SCDs Code Status: FULL  CODE Family Communication: No family present at bedside Disposition Plan: Remain Inpatient for continued Plasmapheresis and suspect likely D/C in AM pending Hematology Clearance.   Consultants:   Hematology/Oncology  Nephrology  Interventional Radiology placed IJ on 02/02/17   Procedures:  Plasmapheresis with Right IJ Dialysis Catheter placed 02/02/17  Antimicrobials:  Anti-infectives    None     Subjective: Seen and examined this AM and had not complaints except intermittent cough. No CP or SOB. No lightheadedness or dizziness. No other concerns or complaints at this time.   Objective: Vitals:   02/05/17 1726 02/05/17 1932 02/05/17 2031 02/06/17 0506  BP: (!) 140/97  (!) 140/93 (!) 145/88  Pulse: 61  67 70  Resp: 18  20 17   Temp: 98.2 F (36.8 C)  98.4 F (36.9 C) 98 F (36.7 C)  TempSrc: Oral  Oral Oral  SpO2:  99% 99% 100%  Weight:   91.5 kg (201 lb 11.2 oz)   Height:        Intake/Output Summary (Last 24 hours) at 02/06/17 0810 Last data filed at 02/06/17 0600  Gross per 24 hour  Intake              322 ml  Output                0 ml  Net              322 ml   Filed Weights   02/04/17 1320 02/04/17 2046 02/05/17 2031  Weight: 91.1 kg (200 lb 13.4 oz) 91.1 kg (200 lb 13.4 oz) 91.5 kg (201 lb 11.2 oz)   Examination: Physical Exam:  Constitutional: WN/WD AAM in NAD appears calm and comfortable Eyes: Sclerae anicteric. Lids non-injected ENMT: External Ears and nose appear normal. MMM Neck: Supple with no JVD; Right IJ in place Respiratory: Diminished with no appreciable wheezing/rales/rhonchi. Patient was not tachypenic or using any accessory muscles to breathe Cardiovascular: RRR; No m/r/g. No extremity edema Abdomen: Soft. NT, ND. Bowel sounds present GU: Deferred Musculoskeletal: No contractures. No cyanosis Skin: Warm and Dry. No rashes or lesions on a limited skin eval. Has Right IJ HD Cath in place Neurologic: CN 2-12 grossly intact. No appreciable  focal deficits Psychiatric: Normal mood and affect. Intact judgement and insight  Data Reviewed: I have personally reviewed following labs and imaging studies  CBC:  Recent Labs Lab 02/02/17 0422  02/03/17 0250 02/03/17 1238 02/04/17 0421 02/04/17 1020 02/05/17 0203 02/06/17 0252  WBC 9.0  --  12.5*  --  13.7*  --  15.5* 9.9  NEUTROABS  --   --   --   --  8.1*  --   --   --   HGB 10.2*  < > 9.8* 10.5* 9.8* 9.2* 9.5* 10.3*  HCT 31.0*  < > 28.3* 31.0* 30.0* 27.0* 29.8* 30.6*  MCV 106.5*  --  106.0*  --  107.9*  --  107.6* 106.6*  PLT 21*  --  23*  --  36*  --  81* 147*  < > = values in this interval not displayed. Basic Metabolic Panel:  Recent Labs Lab 02/02/17 0422  02/03/17 0250 02/03/17 1238 02/04/17 0421 02/04/17 1020 02/05/17 0203 02/06/17 0252  NA 139  < > 139  139 140 144 139 139  K 3.0*  < > 3.9 3.8 3.3* 3.4* 3.7 3.2*  CL 102  < > 104 98* 103 95* 103 102  CO2 29  --  28  --  31  --  29 30  GLUCOSE 118*  < > 166* 202* 129* 209* 178* 205*  BUN 17  < > 23* 19 13 12 13 14   CREATININE 1.12  < > 1.21 0.90 1.06 0.90 0.98 1.10  CALCIUM 9.0  --  9.0  --  8.7*  --  9.0 8.5*  < > = values in this interval not displayed. GFR: Estimated Creatinine Clearance: 98.5 mL/min (by C-G formula based on SCr of 1.1 mg/dL). Liver Function Tests:  Recent Labs Lab 02/02/17 0422 02/03/17 0250 02/04/17 0421 02/05/17 0203 02/06/17 0252  AST 52* 33 27 25 32  ALT 35 25 28 35 51  ALKPHOS 86 56 57 61 70  BILITOT 3.9* 2.8* 1.7* 0.9 0.8  PROT 6.1* 5.5* 5.6* 5.4* 5.2*  ALBUMIN 3.9 3.4* 3.3* 3.3* 3.2*    Recent Labs Lab 02/01/17 1337 02/04/17 0421  LIPASE 69* 47   No results for input(s): AMMONIA in the last 168 hours. Coagulation Profile:  Recent Labs Lab 02/01/17 1647  INR 1.10   Cardiac Enzymes: No results for input(s): CKTOTAL, CKMB, CKMBINDEX, TROPONINI in the last 168 hours. BNP (last 3 results) No results for input(s): PROBNP in the last 8760 hours. HbA1C: No  results for input(s): HGBA1C in the last 72 hours. CBG:  Recent Labs Lab 02/04/17 1659 02/04/17 2043 02/05/17 1444 02/05/17 1723 02/05/17 2143  GLUCAP 393* 240* 123* 216* 239*   Lipid Profile: No results for input(s): CHOL, HDL, LDLCALC, TRIG, CHOLHDL, LDLDIRECT in the last 72 hours. Thyroid Function Tests: No results for input(s): TSH, T4TOTAL, FREET4, T3FREE, THYROIDAB in the last 72 hours. Anemia Panel: No results for input(s): VITAMINB12, FOLATE, FERRITIN, TIBC, IRON, RETICCTPCT in the last 72 hours. Sepsis Labs: No results for input(s): PROCALCITON, LATICACIDVEN in the last 168 hours.  No results found for this or any previous visit (from the past 240 hour(s)).   Radiology Studies: No results found. Scheduled Meds: . amLODipine  5 mg Oral BID  . diphenhydrAMINE  25 mg Oral Daily  . folic acid  1 mg Oral Daily  . insulin aspart  0-20 Units Subcutaneous TID WC  . insulin aspart  0-5 Units Subcutaneous QHS  . multivitamin with minerals  1 tablet Oral Daily  . pantoprazole  40 mg Oral Daily  . potassium chloride  40 mEq Oral BID  . predniSONE  80 mg Oral Q breakfast  . thiamine  100 mg Oral Daily  . traZODone  50 mg Oral QHS   Continuous Infusions: . citrate dextrose      LOS: 5 days   Kerney Elbe, DO [facility] Pager [phone]  If 7PM-7AM, please contact night-coverage [email or web address] Password [email or web address] 02/06/2017, 8:10 AM

## 2017-02-07 LAB — COMPREHENSIVE METABOLIC PANEL
ALK PHOS: 63 U/L (ref 38–126)
ALT: 38 U/L (ref 17–63)
ANION GAP: 8 (ref 5–15)
AST: 24 U/L (ref 15–41)
Albumin: 3.1 g/dL — ABNORMAL LOW (ref 3.5–5.0)
BUN: 13 mg/dL (ref 6–20)
CALCIUM: 9.1 mg/dL (ref 8.9–10.3)
CHLORIDE: 101 mmol/L (ref 101–111)
CO2: 30 mmol/L (ref 22–32)
CREATININE: 0.96 mg/dL (ref 0.61–1.24)
Glucose, Bld: 197 mg/dL — ABNORMAL HIGH (ref 65–99)
Potassium: 3.7 mmol/L (ref 3.5–5.1)
SODIUM: 139 mmol/L (ref 135–145)
Total Bilirubin: 0.9 mg/dL (ref 0.3–1.2)
Total Protein: 5.2 g/dL — ABNORMAL LOW (ref 6.5–8.1)

## 2017-02-07 LAB — THERAPEUTIC PLASMA EXCHANGE (BLOOD BANK)
Plasma Exchange: 3200
Plasma volume needed: 3200
UNIT DIVISION: 0
UNIT DIVISION: 0
UNIT DIVISION: 0
UNIT DIVISION: 0
UNIT DIVISION: 0
Unit division: 0
Unit division: 0
Unit division: 0
Unit division: 0
Unit division: 0
Unit division: 0
Unit division: 0
Unit division: 0

## 2017-02-07 LAB — MAGNESIUM: Magnesium: 1.9 mg/dL (ref 1.7–2.4)

## 2017-02-07 LAB — CBC
HEMATOCRIT: 31.1 % — AB (ref 39.0–52.0)
HEMOGLOBIN: 9.9 g/dL — AB (ref 13.0–17.0)
MCH: 33.8 pg (ref 26.0–34.0)
MCHC: 31.8 g/dL (ref 30.0–36.0)
MCV: 106.1 fL — AB (ref 78.0–100.0)
PLATELETS: 211 10*3/uL (ref 150–400)
RBC: 2.93 MIL/uL — AB (ref 4.22–5.81)
RDW: 14 % (ref 11.5–15.5)
WBC: 12.5 10*3/uL — AB (ref 4.0–10.5)

## 2017-02-07 LAB — PHOSPHORUS: Phosphorus: 4.2 mg/dL (ref 2.5–4.6)

## 2017-02-07 LAB — GLUCOSE, CAPILLARY
GLUCOSE-CAPILLARY: 203 mg/dL — AB (ref 65–99)
Glucose-Capillary: 146 mg/dL — ABNORMAL HIGH (ref 65–99)

## 2017-02-07 LAB — LACTATE DEHYDROGENASE: LDH: 152 U/L (ref 98–192)

## 2017-02-07 MED ORDER — PREDNISONE 20 MG PO TABS: 80.0000 mg | ORAL_TABLET | Freq: Every day | ORAL | 0 refills | Status: DC

## 2017-02-07 MED ORDER — ADULT MULTIVITAMIN W/MINERALS CH: 1.0000 | ORAL_TABLET | Freq: Every day | ORAL | 0 refills | Status: DC

## 2017-02-07 MED ORDER — THIAMINE HCL 100 MG PO TABS: 100.0000 mg | ORAL_TABLET | Freq: Every day | ORAL | 0 refills | Status: DC

## 2017-02-07 MED ORDER — FOLIC ACID 1 MG PO TABS: 1.0000 mg | ORAL_TABLET | Freq: Every day | ORAL | 0 refills | Status: DC

## 2017-02-07 NOTE — Discharge Summary (Signed)
Physician Discharge Summary  Eric Hooper WUJ:[id number] DOB: [birth year] DOA: 02/01/2017  PCP: Patient, No Pcp Per  Admit date: 02/01/2017 Discharge date: 02/07/2017  Admitted From: Home Disposition:  Home  Recommendations for Outpatient Follow-up:  1. Follow up with and establish with a PCP in 1-2 weeks 2. Follow up with Dr. Benay Spice in Hematology for outpatient follow up of TTP and have Steroids weaned and tapered 3. Please obtain CMP/CBC, Mag, Phos in one week  Home Health: No Equipment/Devices: None  Discharge Condition: Stable  CODE STATUS: FULL CODE Diet recommendation: Heart Healthy / Carb Modified (Carb modified temporarily given Hyperglycemia from Steroids)  Brief/Interim Summary: Eric Hooper a 41 y.o. [place] with a PMH of GERD, Hypertension, apparently c/o epigastric pain x2 years along with other comorbids who presented to the ED for Epigastric/Subxiphoid Pan. Patient found to have platelets of 23 concerning for TTP. Oncology consulted and started IV Steroids and Plasmapheresis. Patient feels as if mid-epigastric/subxiphoid pain is improved. He tolerated Plasmapheresis with no problems. Oncology recommending daily plasmapheresis until platelet count normalizes and changed IV Steroids to po Prednisone with plan for outpatient Steroid Taper. Platelet Count Normalized today and case was discussed with Hematology Dr. Lindi Adie who stated it was ok for patient to be D/C'd home and his office will arrange follow up for patient with Dr. Benay Spice. Patient was deemed medically stable and will need to follow up and establish with a PCP in the next 1-2 weeks for Hospital Follow up. Marland Kitchen   Discharge Diagnoses:  Principal Problem:   Thrombocytopenia (HCC) Active Problems:   Hypokalemia   Macrocytic anemia   Hyperglycemia   Microscopic hematuria   Alcohol use   Renal insufficiency   Leukocytosis   Cough   TTP (thrombotic thrombocytopenic purpura) (HCC)  Acute Thrombocytopenia 2/2 to  TTP, improved  -Concernd for TTP. Platelet Count is now 211K from 147K -Undergoing plasmapheresis. DAT negative. -Daily Plasmapheresis with premedication of Benadryl stopped as platelet count is normal -IV Solumedrol (1 mg/kg) changed to po Prednisone and now patient is on 80 mg po Daily and will have taper as an outpatient with Hematology Follow up; C/w PPI while taking Prednisone -LDH went from 1,010 -> 491 -> 322 -> 222 -> 197 -> 152 -T Bili Went from 4.1 -> 2.8 -> 1.7 -> 0.9 -> 0.8 -> 0.9 -HIV and Hepatitis Panel Negative  -RBC folate, [name] Activity was <2.0 and [name] Ab was 72 -Repeat CBC with Differential as an outpatient   Abdominal Pain/SubXiphoid Pain -Lipase was 69 on Admission; Repeat Lipase was 47 -Chronic; Improved -Obtain medical records from [facility] home PPI  -If not improving will consider a GI Consultation as an outpatient   Hypokalemia, improved -K+ now 3.7 -Checkd Mag and was 1.9 -Continue to Monitor and Replete as Necessary -Repeat CMP as an outpatient   Leukocytosis -WBC went from 9.0 -> 12.5 -> 13.7 -> 15.5 -> 9.9 -> 12.5 -Likely from Steroid Demargination -Continue to Monitor for S/Sx of Infection -Repeat CBC as an outpatient   Hyperglycemia likely from Steroid Demargination -CBG's ranging from 146-203 -Hemoglobin A1C was <4.2 -Started Patient on Moderate Novolog SSI AC/HS and will increase to Resistant while hospitalized  -Consulted Diabetes Education Coordinator and they agreed with increasing to Resistant Scale -Continue to Monitor CBG's  -Patient is non-compliant with Diet as Family brining patient food and patient refusing CBG draws; Does not want a Carb Modified Diet -Defer to PCP to control BS while patient is on Prednisone  Essential Hypertension -Continue  Amlodipine 5 mg po BID  Alcohol Use -Continued CIWA with Lorazepam; Lorazepam now discontinued -C/w Folic Acid 1 mg, MVI, and Thiamine at D/C -UDS was Negative    Microscopic Hematuria -No signs/symptoms of UTI. Possibly related to ?TTP.  -PSA obtained on admission and below reference range at 0.50 -Recommend repeat Urinalysis as an outpatient with PCP   Macrocytic Anemia -Patient's Hb/Hct stable at 9.9/31.1 -Continue to Monitor for S/Sx of Bleeding -Repeat CBC with Diff as an outpatient   Mild Renal Insuffiencey/AKI, stable. Likely has some CKD and suspect CKD Stage 2 -BUN/Cr went from 16/0.90 -> 23/1.21 -> 13/1.06 -> 13/0.98 -> 14/1.10 -> 13/0.96 -Avoid Nephrotoxics -Improved -Repeat CMP as an outpatient   Cough -Not Productive and patient describes it as a dry cough  -Added DuoNebs 3 mL q6h; C/w Albuterol Neb 3 mL IH q6hprn -If persists add Anti-tussive -Repeat CXR this AM showed no evidence of active disease  -Follow up with PCP   Discharge Instructions  Discharge Instructions    Call MD for:  difficulty breathing, headache or visual disturbances    Complete by:  As directed    Call MD for:  extreme fatigue    Complete by:  As directed    Call MD for:  hives    Complete by:  As directed    Call MD for:  persistant dizziness or light-headedness    Complete by:  As directed    Call MD for:  persistant nausea and vomiting    Complete by:  As directed    Call MD for:  redness, tenderness, or signs of infection (pain, swelling, redness, odor or green/yellow discharge around incision site)    Complete by:  As directed    Call MD for:  severe uncontrolled pain    Complete by:  As directed    Call MD for:  temperature >100.4    Complete by:  As directed    Diet - low sodium heart healthy    Complete by:  As directed    Discharge instructions    Complete by:  As directed    Follow up with and Establish with PCP. Follow up with Hematology Dr. Benay Spice as an outpatient. Take all medications as prescribed. If symptoms change or worsen please return to the ED for evaluation   Increase activity slowly    Complete by:  As directed       Allergies as of 02/07/2017   No Known Allergies     Medication List    STOP taking these medications   ibuprofen 200 MG tablet Commonly known as:  ADVIL,MOTRIN     TAKE these medications   albuterol 108 (90 Base) MCG/ACT inhaler Commonly known as:  PROVENTIL HFA;VENTOLIN HFA Inhale 2 puffs into the lungs every 6 (six) hours as needed for wheezing or shortness of breath.   amLODipine 5 MG tablet Commonly known as:  NORVASC Take 5 mg by mouth 2 (two) times daily.   fluticasone 50 MCG/ACT nasal spray Commonly known as:  FLONASE Place 2 sprays into both nostrils daily.   folic acid 1 MG tablet Commonly known as:  FOLVITE Take 1 tablet (1 mg total) by mouth daily.   multivitamin with minerals Tabs tablet Take 1 tablet by mouth daily.   NEXIUM PO Take 22.3-44.6 mg by mouth 2 (two) times daily. What changed:  Another medication with the same name was removed. Continue taking this medication, and follow the directions you see here.   predniSONE 20  MG tablet Commonly known as:  DELTASONE Take 4 tablets (80 mg total) by mouth daily with breakfast.   ranitidine 150 MG tablet Commonly known as:  ZANTAC Take 1 tablet (150 mg total) by mouth 2 (two) times daily.   thiamine 100 MG tablet Take 1 tablet (100 mg total) by mouth daily.      Follow-up Information    Ladell Pier, MD. Call.   Specialty:  Oncology Why:  Follow up with Dr. Benay Spice within 1-2 weeks Contact information: [place] [address] [phone]          No Known Allergies  Consultations:  Hematology Dr. Benay Spice  Procedures/Studies: Ct Abdomen Pelvis W Contrast  Result Date: 02/01/2017 CLINICAL DATA:  Upper abdominal pain for 1-2 years. EXAM: CT ABDOMEN AND PELVIS WITH CONTRAST TECHNIQUE: Multidetector CT imaging of the abdomen and pelvis was performed using the standard protocol following bolus administration of intravenous contrast. CONTRAST:  100 cc  ISOVUE-300 IOPAMIDOL (ISOVUE-300) INJECTION 61% COMPARISON:  None. FINDINGS: Lower chest: No acute abnormality. Hepatobiliary: No focal liver abnormality is seen. No gallstones, gallbladder wall thickening, or biliary dilatation. Pancreas: Unremarkable. No pancreatic ductal dilatation or surrounding inflammatory changes. Spleen: Normal in size without focal abnormality. Adrenals/Urinary Tract: Adrenal glands are unremarkable. Kidneys are normal, without renal calculi, focal lesion, or hydronephrosis. Bladder is unremarkable. Stomach/Bowel: Diffuse mucosal thickening of the gastric mucosa. Appendix is not identified but there are no secondary signs of appendicitis. No evidence of bowel wall thickening, distention, or inflammatory changes. Left colonic diverticulosis. Vascular/Lymphatic: No significant vascular findings are present. No enlarged abdominal or pelvic lymph nodes. Reproductive: Prostate is unremarkable. Other: No abdominal wall hernia or abnormality. Minimal amount of free fluid in the posterior pelvis. Musculoskeletal: No acute or significant osseous findings. IMPRESSION: Diffuse mucosal thickening of the gastric wall. This may represent inflammatory changes versus an infiltrative process. Correlation with upper endoscopy may be considered. Left colonic diverticulosis, without CT evidence of diverticulitis. Small amount of free fluid in the pelvis, nonspecific finding usually associated with inflammatory changes. No CT evidence of abnormalities within the solid abdominal organs. Electronically Signed   By: Fidela Salisbury M.D.   On: 02/01/2017 18:31   Ir Fluoro Guide Cv Line Right  Result Date: 02/02/2017 CLINICAL DATA:  Thrombocytopenia, TTP and need for non tunneled dialysis catheter for plasmapheresis. EXAM: NON-TUNNELED CENTRAL VENOUS CATHETER PLACEMENT WITH ULTRASOUND AND FLUOROSCOPIC GUIDANCE FLUOROSCOPY TIME:  12 seconds.  1.0 mGy. PROCEDURE: The procedure, risks, benefits, and  alternatives were explained to the patient. Questions regarding the procedure were encouraged and answered. The patient understands and consents to the procedure. A time-out was performed prior to initiating the procedure. Ultrasound was utilized to confirm patency of the right internal jugular vein. The right neck and chest were prepped with chlorhexidine in a sterile fashion, and a sterile drape was applied covering the operative field. Maximum barrier sterile technique with sterile gowns and gloves were used for the procedure. Local anesthesia was provided with 1% lidocaine. After creating a small venotomy incision, a 19 gauge needle was advanced into the right internal jugular vein under direct, real-time ultrasound guidance. Ultrasound image documentation was performed. After securing guidewire access, venous access was dilated. A 20 cm, 12 French triple lumen Mahurkar non tunneled catheter was then advanced over the wire. Final catheter positioning was confirmed and documented with a fluoroscopic spot image. The catheter was aspirated, flushed with saline, and injected with appropriate volume heparin dwells. The catheter exit site was  secured with a 0-Prolene retention suture. COMPLICATIONS: None.  No pneumothorax. FINDINGS: After catheter placement, the tip lies at the cavoatrial junction. The catheter aspirates normally and is ready for immediate use. IMPRESSION: Placement of non-tunneled HD/pheresis catheter via the right internal jugular vein. The catheter tip lies at the cavoatrial junction. The catheter is ready for immediate use. Electronically Signed   By: Aletta Edouard M.D.   On: 02/02/2017 09:45   Ir US Guide Vasc Access Right  Result Date: 02/02/2017 CLINICAL DATA:  Thrombocytopenia, TTP and need for non tunneled dialysis catheter for plasmapheresis. EXAM: NON-TUNNELED CENTRAL VENOUS CATHETER PLACEMENT WITH ULTRASOUND AND FLUOROSCOPIC GUIDANCE FLUOROSCOPY TIME:  12 seconds.  1.0 mGy.  PROCEDURE: The procedure, risks, benefits, and alternatives were explained to the patient. Questions regarding the procedure were encouraged and answered. The patient understands and consents to the procedure. A time-out was performed prior to initiating the procedure. Ultrasound was utilized to confirm patency of the right internal jugular vein. The right neck and chest were prepped with chlorhexidine in a sterile fashion, and a sterile drape was applied covering the operative field. Maximum barrier sterile technique with sterile gowns and gloves were used for the procedure. Local anesthesia was provided with 1% lidocaine. After creating a small venotomy incision, a 19 gauge needle was advanced into the right internal jugular vein under direct, real-time ultrasound guidance. Ultrasound image documentation was performed. After securing guidewire access, venous access was dilated. A 20 cm, 12 French triple lumen Mahurkar non tunneled catheter was then advanced over the wire. Final catheter positioning was confirmed and documented with a fluoroscopic spot image. The catheter was aspirated, flushed with saline, and injected with appropriate volume heparin dwells. The catheter exit site was secured with a 0-Prolene retention suture. COMPLICATIONS: None.  No pneumothorax. FINDINGS: After catheter placement, the tip lies at the cavoatrial junction. The catheter aspirates normally and is ready for immediate use. IMPRESSION: Placement of non-tunneled HD/pheresis catheter via the right internal jugular vein. The catheter tip lies at the cavoatrial junction. The catheter is ready for immediate use. Electronically Signed   By: Aletta Edouard M.D.   On: 02/02/2017 09:45   Dg Chest Port 1 View  Result Date: 02/06/2017 CLINICAL DATA:  Itch in the back of throat with dry cough. EXAM: PORTABLE CHEST 1 VIEW COMPARISON:  10/05/2016 FINDINGS: There is a dialysis catheter on the right with tip at the upper cavoatrial junction.  There is no edema, consolidation, effusion, or pneumothorax. Normal heart size and mediastinal contours. IMPRESSION: No evidence of active disease. Electronically Signed   By: Monte Fantasia M.D.   On: 02/06/2017 09:37    Plasmapheresis was done while patient was hospitalized   Subjective: Seen and examined at beside and felt better. No CP or SOB. And No subxiphoid pain. Still had a mild cough but no other complaints. Ready to go home  Discharge Exam: Vitals:   02/07/17 0707 02/07/17 1000  BP: (!) 154/99 (!) 149/89  Pulse: 73 73  Resp: 17 18  Temp: 98 F (36.7 C) 98.1 F (36.7 C)  SpO2: 98% 97%   Vitals:   02/06/17 2338 02/07/17 0310 02/07/17 0707 02/07/17 1000  BP: (!) 142/90  (!) 154/99 (!) 149/89  Pulse: 86  73 73  Resp: 17  17 18   Temp: 97.7 F (36.5 C)  98 F (36.7 C) 98.1 F (36.7 C)  TempSrc:      SpO2: 97%  98% 97%  Weight:  92.2 kg (203 lb 4.2  oz)    Height:       General: Pt is alert, awake, not in acute distress Cardiovascular: RRR, S1/S2 +, no rubs, no gallops Respiratory: Diminished bilaterally, no wheezing, no rhonchi; Patient was not tachypenic or using any accessory muscles to breathe Abdominal: Soft, NT, ND, bowel sounds + Extremities: no edema, no cyanosis; Had Right IJ HD catheter that will be removed prior to D/C  The results of significant diagnostics from this hospitalization (including imaging, microbiology, ancillary and laboratory) are listed below for reference.    Microbiology: No results found for this or any previous visit (from the past 240 hour(s)).   Labs: BNP (last 3 results) No results for input(s): BNP in the last 8760 hours. Basic Metabolic Panel:  Recent Labs Lab 02/03/17 0250  02/04/17 0421 02/04/17 1020 02/05/17 0203 02/06/17 0252 02/06/17 1000 02/07/17 0331  NA 139  < > 140 144 139 139  --  139  K 3.9  < > 3.3* 3.4* 3.7 3.2*  --  3.7  CL 104  < > 103 95* 103 102  --  101  CO2 28  --  31  --  29 30  --  30  GLUCOSE  166*  < > 129* 209* 178* 205*  --  197*  BUN 23*  < > 13 12 13 14   --  13  CREATININE 1.21  < > 1.06 0.90 0.98 1.10  --  0.96  CALCIUM 9.0  --  8.7*  --  9.0 8.5*  --  9.1  MG  --   --   --   --   --   --  1.8 1.9  PHOS  --   --   --   --   --   --  4.8* 4.2  < > = values in this interval not displayed. Liver Function Tests:  Recent Labs Lab 02/03/17 0250 02/04/17 0421 02/05/17 0203 02/06/17 0252 02/07/17 0331  AST 33 27 25 32 24  ALT 25 28 35 51 38  ALKPHOS 56 57 61 70 63  BILITOT 2.8* 1.7* 0.9 0.8 0.9  PROT 5.5* 5.6* 5.4* 5.2* 5.2*  ALBUMIN 3.4* 3.3* 3.3* 3.2* 3.1*    Recent Labs Lab 02/01/17 1337 02/04/17 0421  LIPASE 69* 47   No results for input(s): AMMONIA in the last 168 hours. CBC:  Recent Labs Lab 02/03/17 0250  02/04/17 0421 02/04/17 1020 02/05/17 0203 02/06/17 0252 02/07/17 0331  WBC 12.5*  --  13.7*  --  15.5* 9.9 12.5*  NEUTROABS  --   --  8.1*  --   --   --   --   HGB 9.8*  < > 9.8* 9.2* 9.5* 10.3* 9.9*  HCT 28.3*  < > 30.0* 27.0* 29.8* 30.6* 31.1*  MCV 106.0*  --  107.9*  --  107.6* 106.6* 106.1*  PLT 23*  --  36*  --  81* 147* 211  < > = values in this interval not displayed. Cardiac Enzymes: No results for input(s): CKTOTAL, CKMB, CKMBINDEX, TROPONINI in the last 168 hours. BNP: Invalid input(s): POCBNP CBG:  Recent Labs Lab 02/06/17 0827 02/06/17 1202 02/06/17 2335 02/07/17 0705 02/07/17 1147  GLUCAP 162* 269* 227* 146* 203*   D-Dimer No results for input(s): DDIMER in the last 72 hours. Hgb A1c No results for input(s): HGBA1C in the last 72 hours. Lipid Profile No results for input(s): CHOL, HDL, LDLCALC, TRIG, CHOLHDL, LDLDIRECT in the last 72 hours. Thyroid function studies No  results for input(s): TSH, T4TOTAL, T3FREE, THYROIDAB in the last 72 hours.  Invalid input(s): FREET3 Anemia work up No results for input(s): VITAMINB12, FOLATE, FERRITIN, TIBC, IRON, RETICCTPCT in the last 72 hours. Urinalysis    Component Value  Date/Time   COLORURINE AMBER (A) 02/01/2017 [place] 02/01/2017 1348   LABSPEC 1.027 02/01/2017 1348   PHURINE 5.0 02/01/2017 1348   GLUCOSEU NEGATIVE 02/01/2017 1348   HGBUR MODERATE (A) 02/01/2017 1348   BILIRUBINUR NEGATIVE 02/01/2017 1348   KETONESUR NEGATIVE 02/01/2017 1348   PROTEINUR >=300 (A) 02/01/2017 1348   NITRITE NEGATIVE 02/01/2017 1348   LEUKOCYTESUR NEGATIVE 02/01/2017 1348   Sepsis Labs Invalid input(s): PROCALCITONIN,  WBC,  LACTICIDVEN Microbiology No results found for this or any previous visit (from the past 240 hour(s)).  Time coordinating discharge: 35 minutes  SIGNED:  Kerney Elbe, DO [facility] 02/07/2017, 7:36 PM Pager [phone]  If 7PM-7AM, please contact night-coverage [email or web address] Password [email or web address]

## 2017-02-08 ENCOUNTER — Telehealth: Payer: Self-pay | Admitting: *Deleted

## 2017-02-08 DIAGNOSIS — D696 Thrombocytopenia, unspecified: Secondary | ICD-10-CM

## 2017-02-08 MED ORDER — PREDNISONE 20 MG PO TABS: 60.0000 mg | ORAL_TABLET | Freq: Every day | ORAL | 0 refills | Status: DC

## 2017-02-08 NOTE — Telephone Encounter (Signed)
Called pt, he was discharged on 10/14 with prescription for Prednisone 80mg  daily. Instructed him to decrease to 60 mg (3 tabs) daily. Teach back complete.  Pt reports pheresis catheter was removed prior to discharge. Informed him of lab appt on 10/17 @ 0900. He reports he was "written out of work until 10/18."

## 2017-02-10 ENCOUNTER — Telehealth: Payer: Self-pay | Admitting: *Deleted

## 2017-02-10 ENCOUNTER — Other Ambulatory Visit (HOSPITAL_BASED_OUTPATIENT_CLINIC_OR_DEPARTMENT_OTHER): Payer: BLUE CROSS/BLUE SHIELD

## 2017-02-10 ENCOUNTER — Other Ambulatory Visit: Payer: Self-pay | Admitting: *Deleted

## 2017-02-10 DIAGNOSIS — M3119 Other thrombotic microangiopathy: Secondary | ICD-10-CM

## 2017-02-10 DIAGNOSIS — M311 Thrombotic microangiopathy: Secondary | ICD-10-CM

## 2017-02-10 DIAGNOSIS — D696 Thrombocytopenia, unspecified: Secondary | ICD-10-CM

## 2017-02-10 LAB — CBC WITH DIFFERENTIAL/PLATELET
BASO%: 0.4 % (ref 0.0–2.0)
Basophils Absolute: 0 10*3/uL (ref 0.0–0.1)
EOS%: 1.5 % (ref 0.0–7.0)
Eosinophils Absolute: 0.2 10*3/uL (ref 0.0–0.5)
HCT: 33.8 % — ABNORMAL LOW (ref 38.4–49.9)
HEMOGLOBIN: 11.6 g/dL — AB (ref 13.0–17.1)
LYMPH#: 3.2 10*3/uL (ref 0.9–3.3)
LYMPH%: 24.9 % (ref 14.0–49.0)
MCH: 36.4 pg — ABNORMAL HIGH (ref 27.2–33.4)
MCHC: 34.3 g/dL (ref 32.0–36.0)
MCV: 106.1 fL — ABNORMAL HIGH (ref 79.3–98.0)
MONO#: 0.9 10*3/uL (ref 0.1–0.9)
MONO%: 6.8 % (ref 0.0–14.0)
NEUT%: 66.4 % (ref 39.0–75.0)
NEUTROS ABS: 8.5 10*3/uL — AB (ref 1.5–6.5)
Platelets: 386 10*3/uL (ref 140–400)
RBC: 3.19 10*6/uL — AB (ref 4.20–5.82)
RDW: 14.4 % (ref 11.0–14.6)
WBC: 12.8 10*3/uL — ABNORMAL HIGH (ref 4.0–10.3)

## 2017-02-10 LAB — COMPREHENSIVE METABOLIC PANEL
ALBUMIN: 3.7 g/dL (ref 3.5–5.0)
ALK PHOS: 82 U/L (ref 40–150)
ALT: 74 U/L — ABNORMAL HIGH (ref 0–55)
AST: 22 U/L (ref 5–34)
Anion Gap: 9 mEq/L (ref 3–11)
BILIRUBIN TOTAL: 0.72 mg/dL (ref 0.20–1.20)
BUN: 20.9 mg/dL (ref 7.0–26.0)
CO2: 24 mEq/L (ref 22–29)
Calcium: 8.9 mg/dL (ref 8.4–10.4)
Chloride: 106 mEq/L (ref 98–109)
Creatinine: 1.1 mg/dL (ref 0.7–1.3)
GLUCOSE: 230 mg/dL — AB (ref 70–140)
POTASSIUM: 3.8 meq/L (ref 3.5–5.1)
SODIUM: 139 meq/L (ref 136–145)
TOTAL PROTEIN: 6.1 g/dL — AB (ref 6.4–8.3)

## 2017-02-10 LAB — LACTATE DEHYDROGENASE: LDH: 204 U/L (ref 125–245)

## 2017-02-10 NOTE — Telephone Encounter (Signed)
Late entry: Spoke with pt in lobby while here for lab. He reports he "felt the same last night as when I went to the emergency room."  Pt reported subxiphoid pain woke him up at 0100. He felt a "pop" in that area and it has remained 6/10. He is taking Nexium BID. He went on to say he is due to return to work on 10/18 but he feels "too weak" and there is no such thing as light duty at the [employer] he works for. Dr. Benay Spice reviewed labs: Continue Prednisone 60 mg daily. MD does not think the pain is related to his TTP diagnosis. Dr. Benay Spice recommends pt get see a PCP for this pain. Notified pt of the above, he voiced understanding. Pt requests a letter to remain out of work until next visit.

## 2017-02-12 ENCOUNTER — Encounter: Payer: Self-pay | Admitting: Medical Oncology

## 2017-02-16 ENCOUNTER — Telehealth: Payer: Self-pay

## 2017-02-16 ENCOUNTER — Other Ambulatory Visit (HOSPITAL_BASED_OUTPATIENT_CLINIC_OR_DEPARTMENT_OTHER): Payer: BLUE CROSS/BLUE SHIELD

## 2017-02-16 ENCOUNTER — Ambulatory Visit (HOSPITAL_BASED_OUTPATIENT_CLINIC_OR_DEPARTMENT_OTHER): Payer: BLUE CROSS/BLUE SHIELD | Admitting: Nurse Practitioner

## 2017-02-16 VITALS — BP 148/91 | HR 77 | Temp 99.7°F | Resp 18 | Ht 70.0 in | Wt 201.5 lb

## 2017-02-16 DIAGNOSIS — M3119 Other thrombotic microangiopathy: Secondary | ICD-10-CM

## 2017-02-16 DIAGNOSIS — E119 Type 2 diabetes mellitus without complications: Secondary | ICD-10-CM

## 2017-02-16 DIAGNOSIS — N289 Disorder of kidney and ureter, unspecified: Secondary | ICD-10-CM

## 2017-02-16 DIAGNOSIS — M311 Thrombotic microangiopathy: Secondary | ICD-10-CM

## 2017-02-16 DIAGNOSIS — R109 Unspecified abdominal pain: Secondary | ICD-10-CM

## 2017-02-16 DIAGNOSIS — I10 Essential (primary) hypertension: Secondary | ICD-10-CM | POA: Diagnosis not present

## 2017-02-16 DIAGNOSIS — H538 Other visual disturbances: Secondary | ICD-10-CM | POA: Diagnosis not present

## 2017-02-16 LAB — CBC WITH DIFFERENTIAL/PLATELET
BASO%: 0.3 % (ref 0.0–2.0)
BASOS ABS: 0.1 10*3/uL (ref 0.0–0.1)
EOS ABS: 0.2 10*3/uL (ref 0.0–0.5)
EOS%: 1.1 % (ref 0.0–7.0)
HCT: 38.5 % (ref 38.4–49.9)
HGB: 12.9 g/dL — ABNORMAL LOW (ref 13.0–17.1)
LYMPH%: 26.8 % (ref 14.0–49.0)
MCH: 35.1 pg — AB (ref 27.2–33.4)
MCHC: 33.6 g/dL (ref 32.0–36.0)
MCV: 104.6 fL — AB (ref 79.3–98.0)
MONO#: 1.1 10*3/uL — ABNORMAL HIGH (ref 0.1–0.9)
MONO%: 5.6 % (ref 0.0–14.0)
NEUT#: 12.5 10*3/uL — ABNORMAL HIGH (ref 1.5–6.5)
NEUT%: 66.2 % (ref 39.0–75.0)
Platelets: 276 10*3/uL (ref 140–400)
RBC: 3.68 10*6/uL — AB (ref 4.20–5.82)
RDW: 14.3 % (ref 11.0–14.6)
WBC: 19 10*3/uL — ABNORMAL HIGH (ref 4.0–10.3)
lymph#: 5.1 10*3/uL — ABNORMAL HIGH (ref 0.9–3.3)

## 2017-02-16 NOTE — Telephone Encounter (Signed)
Printed avs and calender for upcoming appointment for Oct. And Nov. Per 10/23 los

## 2017-02-16 NOTE — Progress Notes (Addendum)
  [facility] OFFICE PROGRESS NOTE   Diagnosis:  TTP  INTERVAL HISTORY:   Eric Hooper is seen in his first outpatient visit since hospital discharge. He was hospitalized 02/01/2017 presenting with abdominal pain and found to have severe thrombocytopenia. He was seen by Dr. Benay Spice and diagnosed with TTP. Plasma exchange and steroids initiated. The platelet count normalized. He was discharged 02/07/2017 on prednisone 60 mg daily.  He continues prednisone. No unusual headaches. He complains of blurry vision. No rash. No bleeding. No leg swelling. No fever. He reports a good appetite. He continues to have intermittent abdominal pain.  Objective:  Vital signs in last 24 hours:  Blood pressure (!) 148/91, pulse 77, temperature 99.7 F (37.6 C), temperature source Oral, resp. rate 18, height 5\' 10"  (1.778 m), weight 201 lb 8 oz (91.4 kg), SpO2 100 %.    HEENT: no thrush or ulcers Lymphatics: no palpable cervical or supraclavicular lymph nodes. Resp: lungs clear bilaterally. Cardio: regular rate and rhythm. GI: no hepatomegaly. Vascular: no leg edema. Neuro: alert and oriented.  Skin: no rash.    Lab Results:  Lab Results  Component Value Date   WBC 19.0 (H) 02/16/2017   HGB 12.9 (L) 02/16/2017   HCT 38.5 02/16/2017   MCV 104.6 (H) 02/16/2017   PLT 276 02/16/2017   NEUTROABS 12.5 (H) 02/16/2017    Imaging:  No results found.  Medications: I have reviewed the patient's current medications.  Assessment/Plan: 1. TTP  Initiation of daily plasma exchange and Solu-Medrol 02/02/2017  [name] Antibody- 72, activity less than 2%  Last plasma exchange 02/06/2017  Prednisone 60 mg daily beginning 02/08/2017  Prednisone 40 mg daily beginning 02/17/2017 2. Xiphoid pain-etiology unclear, improved 3. Hypertension 4. Diabetes 5. Alcohol/Tobacco use 6. Renal insufficiency 7. Right IJ dialysis catheter placed 02/02/2017   Disposition: Eric Hooper remains  stable from a hematologic standpoint. Prednisone will be tapered to 40 mg daily beginning 02/17/2017. He will return for follow-up labs 02/24/2017. We will see him in follow-up in approximately 3 weeks. He will contact the office in the interim with any problems.  Patient seen with Dr. Benay Spice.    Ned Card ANP/GNP-BC   02/16/2017  12:00 PM This was a shared visit with Ned Card. Eric Hooper is in clinical remission from TTP. He will continue a prednisone taper.  Julieanne Manson, M.D.

## 2017-02-17 ENCOUNTER — Telehealth: Payer: Self-pay | Admitting: *Deleted

## 2017-02-17 NOTE — Telephone Encounter (Signed)
Patients wife called requesting a note for the patient for being out of work from 10/8 [facility] Admission) until follow up yesterday. Employer would not approve disability with two separate notes. Letter written and placed for pick up at front desk.

## 2017-02-24 ENCOUNTER — Other Ambulatory Visit: Payer: BLUE CROSS/BLUE SHIELD

## 2017-02-25 ENCOUNTER — Other Ambulatory Visit (HOSPITAL_BASED_OUTPATIENT_CLINIC_OR_DEPARTMENT_OTHER): Payer: BLUE CROSS/BLUE SHIELD

## 2017-02-25 DIAGNOSIS — M3119 Other thrombotic microangiopathy: Secondary | ICD-10-CM

## 2017-02-25 DIAGNOSIS — M311 Thrombotic microangiopathy: Secondary | ICD-10-CM | POA: Diagnosis not present

## 2017-02-25 LAB — COMPREHENSIVE METABOLIC PANEL
ALK PHOS: 88 U/L (ref 40–150)
ALT: 44 U/L (ref 0–55)
AST: 14 U/L (ref 5–34)
Albumin: 4.1 g/dL (ref 3.5–5.0)
Anion Gap: 7 mEq/L (ref 3–11)
BUN: 18.1 mg/dL (ref 7.0–26.0)
CALCIUM: 9.5 mg/dL (ref 8.4–10.4)
CHLORIDE: 104 meq/L (ref 98–109)
CO2: 28 mEq/L (ref 22–29)
Creatinine: 1.3 mg/dL (ref 0.7–1.3)
Glucose: 303 mg/dl — ABNORMAL HIGH (ref 70–140)
POTASSIUM: 4.3 meq/L (ref 3.5–5.1)
SODIUM: 139 meq/L (ref 136–145)
Total Bilirubin: 0.89 mg/dL (ref 0.20–1.20)
Total Protein: 6.7 g/dL (ref 6.4–8.3)

## 2017-02-25 LAB — CBC WITH DIFFERENTIAL/PLATELET
BASO%: 0.2 % (ref 0.0–2.0)
Basophils Absolute: 0 10*3/uL (ref 0.0–0.1)
EOS ABS: 0 10*3/uL (ref 0.0–0.5)
EOS%: 0.1 % (ref 0.0–7.0)
HCT: 39.6 % (ref 38.4–49.9)
HGB: 13.3 g/dL (ref 13.0–17.1)
LYMPH%: 15.4 % (ref 14.0–49.0)
MCH: 34.5 pg — AB (ref 27.2–33.4)
MCHC: 33.6 g/dL (ref 32.0–36.0)
MCV: 102.6 fL — AB (ref 79.3–98.0)
MONO#: 0.8 10*3/uL (ref 0.1–0.9)
MONO%: 6.1 % (ref 0.0–14.0)
NEUT%: 78.2 % — ABNORMAL HIGH (ref 39.0–75.0)
NEUTROS ABS: 10.1 10*3/uL — AB (ref 1.5–6.5)
PLATELETS: 106 10*3/uL — AB (ref 140–400)
RBC: 3.86 10*6/uL — ABNORMAL LOW (ref 4.20–5.82)
RDW: 13.1 % (ref 11.0–14.6)
WBC: 12.9 10*3/uL — AB (ref 4.0–10.3)
lymph#: 2 10*3/uL (ref 0.9–3.3)
nRBC: 0 % (ref 0–0)

## 2017-02-25 LAB — LACTATE DEHYDROGENASE: LDH: 223 U/L (ref 125–245)

## 2017-02-26 ENCOUNTER — Telehealth: Payer: Self-pay | Admitting: *Deleted

## 2017-02-26 ENCOUNTER — Telehealth: Payer: Self-pay | Admitting: Oncology

## 2017-02-26 DIAGNOSIS — D696 Thrombocytopenia, unspecified: Secondary | ICD-10-CM

## 2017-02-26 MED ORDER — PREDNISONE 20 MG PO TABS: 40.0000 mg | ORAL_TABLET | Freq: Every day | ORAL | 0 refills | Status: DC

## 2017-02-26 NOTE — Telephone Encounter (Signed)
Telephone call to patient's wife. Advised lab results. Confirmed lab appt for 11/6. They do need a refill sent to the pharmacy for the prednisone. Refill sent. She understands to give this office a call with any concerns or questions.

## 2017-02-26 NOTE — Telephone Encounter (Signed)
Scheduled appt per 11/1 sch msg - left voicemail for patient regarding appt that was added.

## 2017-02-26 NOTE — Telephone Encounter (Signed)
-----   Message from Owens Shark, NP sent at 02/25/2017  5:05 PM EDT ----- Please notify him platelets are decreased. Continue same Prednisone and repeat lab 11/6.

## 2017-03-02 ENCOUNTER — Other Ambulatory Visit (HOSPITAL_BASED_OUTPATIENT_CLINIC_OR_DEPARTMENT_OTHER): Payer: BLUE CROSS/BLUE SHIELD

## 2017-03-02 DIAGNOSIS — M3119 Other thrombotic microangiopathy: Secondary | ICD-10-CM

## 2017-03-02 DIAGNOSIS — M311 Thrombotic microangiopathy: Secondary | ICD-10-CM | POA: Diagnosis not present

## 2017-03-02 LAB — CBC WITH DIFFERENTIAL/PLATELET
BASO%: 0.1 % (ref 0.0–2.0)
BASOS ABS: 0 10*3/uL (ref 0.0–0.1)
EOS ABS: 0 10*3/uL (ref 0.0–0.5)
EOS%: 0 % (ref 0.0–7.0)
HCT: 40.8 % (ref 38.4–49.9)
HGB: 13.9 g/dL (ref 13.0–17.1)
LYMPH%: 13.3 % — AB (ref 14.0–49.0)
MCH: 34.7 pg — AB (ref 27.2–33.4)
MCHC: 34.1 g/dL (ref 32.0–36.0)
MCV: 101.7 fL — AB (ref 79.3–98.0)
MONO#: 0.5 10*3/uL (ref 0.1–0.9)
MONO%: 4.9 % (ref 0.0–14.0)
NEUT#: 8 10*3/uL — ABNORMAL HIGH (ref 1.5–6.5)
NEUT%: 81.7 % — AB (ref 39.0–75.0)
PLATELETS: 146 10*3/uL (ref 140–400)
RBC: 4.01 10*6/uL — AB (ref 4.20–5.82)
RDW: 12.9 % (ref 11.0–14.6)
WBC: 9.8 10*3/uL (ref 4.0–10.3)
lymph#: 1.3 10*3/uL (ref 0.9–3.3)

## 2017-03-02 LAB — COMPREHENSIVE METABOLIC PANEL
ALT: 36 U/L (ref 0–55)
AST: 13 U/L (ref 5–34)
Albumin: 4.2 g/dL (ref 3.5–5.0)
Alkaline Phosphatase: 92 U/L (ref 40–150)
Anion Gap: 10 mEq/L (ref 3–11)
BUN: 16.1 mg/dL (ref 7.0–26.0)
CALCIUM: 9.6 mg/dL (ref 8.4–10.4)
CHLORIDE: 104 meq/L (ref 98–109)
CO2: 24 meq/L (ref 22–29)
CREATININE: 1.2 mg/dL (ref 0.7–1.3)
EGFR: >60 mL/min/{1.73_m2} (ref >60)
Glucose: 406 mg/dl — ABNORMAL HIGH (ref 70–140)
Potassium: 4.3 mEq/L (ref 3.5–5.1)
SODIUM: 139 meq/L (ref 136–145)
Total Bilirubin: 0.6 mg/dL (ref 0.20–1.20)
Total Protein: 6.9 g/dL (ref 6.4–8.3)

## 2017-03-02 LAB — LACTATE DEHYDROGENASE: LDH: 213 U/L (ref 125–245)

## 2017-03-03 ENCOUNTER — Telehealth: Payer: Self-pay | Admitting: Emergency Medicine

## 2017-03-03 ENCOUNTER — Telehealth: Payer: Self-pay | Admitting: *Deleted

## 2017-03-03 NOTE — Telephone Encounter (Signed)
Called patient and significant other per Lattie Haw, NP to inform him of elevated blood glucose. Lattie Haw recommends patient see PCP this week and check BS regularly. Will try to contact patient again today, no VM set up on either contact number.

## 2017-03-03 NOTE — Telephone Encounter (Signed)
Unable to reach pt, spoke with S/O: Instructed her to have him stop all sodas, juices and any concentrated sweets. Follow diabetic diet with no added sugar. Increase PO intake of water. Resume Metformin 500 mg daily, per Ned Card, NP. Teach back complete. Eric Hooper reports pt began taking the Metformin today.  Discussed importance of obtaining PCP, Eric Hooper stated they will contact insurance company to determine who is in network for him.

## 2017-03-03 NOTE — Telephone Encounter (Signed)
Called patients wife and informed her that pt glucose is 406. Pt does not have a PCP to help control this. Pt wife states she will be calling today to try and find pt a PCP. Pt states they do not check blood sugar, they do not own a meter or lancets. Instructed pt per Ned Card, NP that she would like for them to be seen by a PCP this week if possible. Pt verbalized understanding of this message.

## 2017-03-04 ENCOUNTER — Other Ambulatory Visit: Payer: Self-pay | Admitting: Emergency Medicine

## 2017-03-04 ENCOUNTER — Ambulatory Visit: Payer: Self-pay | Admitting: Physician Assistant

## 2017-03-04 ENCOUNTER — Telehealth: Payer: Self-pay | Admitting: Emergency Medicine

## 2017-03-04 DIAGNOSIS — D696 Thrombocytopenia, unspecified: Secondary | ICD-10-CM

## 2017-03-04 MED ORDER — PREDNISONE 20 MG PO TABS: 40.0000 mg | ORAL_TABLET | Freq: Every day | ORAL | 0 refills | Status: DC

## 2017-03-04 NOTE — Telephone Encounter (Signed)
Appt made by this nurse for diabetic management at [facility] in [place]. Appt is  On 11/9 @ 5pm. PT wife verbalized understanding of this.

## 2017-03-05 ENCOUNTER — Other Ambulatory Visit: Payer: Self-pay

## 2017-03-05 ENCOUNTER — Ambulatory Visit (INDEPENDENT_AMBULATORY_CARE_PROVIDER_SITE_OTHER): Payer: BLUE CROSS/BLUE SHIELD | Admitting: Emergency Medicine

## 2017-03-05 ENCOUNTER — Encounter: Payer: Self-pay | Admitting: Emergency Medicine

## 2017-03-05 VITALS — BP 128/70 | HR 85 | Temp 98.8°F | Resp 16 | Ht 69.0 in | Wt 195.8 lb

## 2017-03-05 DIAGNOSIS — D696 Thrombocytopenia, unspecified: Secondary | ICD-10-CM | POA: Diagnosis not present

## 2017-03-05 DIAGNOSIS — E119 Type 2 diabetes mellitus without complications: Secondary | ICD-10-CM | POA: Diagnosis not present

## 2017-03-05 LAB — GLUCOSE, POCT (MANUAL RESULT ENTRY): POC GLUCOSE: 247 mg/dL — AB (ref 70–99)

## 2017-03-05 MED ORDER — BLOOD GLUCOSE MONITOR KIT: PACK | 0 refills | Status: DC

## 2017-03-05 MED ORDER — METFORMIN HCL 500 MG PO TABS: 500.0000 mg | ORAL_TABLET | Freq: Two times a day (BID) | ORAL | 3 refills | Status: DC

## 2017-03-05 MED ORDER — FOLIC ACID 1 MG PO TABS: 1.0000 mg | ORAL_TABLET | Freq: Every day | ORAL | 1 refills | Status: DC

## 2017-03-05 MED ORDER — THIAMINE HCL 100 MG PO TABS: 100.0000 mg | ORAL_TABLET | Freq: Every day | ORAL | 1 refills | Status: DC

## 2017-03-05 NOTE — Patient Instructions (Addendum)
     IF you received an x-ray today, you will receive an invoice from [facility]. Please contact [facility] at [phone] with questions or concerns regarding your invoice.   IF you received labwork today, you will receive an invoice from [place] [facility]. Please contact [facility] at [phone] with questions or concerns regarding your invoice.   Our billing staff will not be able to assist you with questions regarding bills from these companies.  You will be contacted with the lab results as soon as they are available. The fastest way to get your results is to activate your My Chart account. Instructions are located on the last page of this paperwork. If you have not heard from [place] regarding the results in 2 weeks, please contact this office.     Type 2 Diabetes Mellitus, Diagnosis, Adult Type 2 diabetes (type 2 diabetes mellitus) is a long-term (chronic) disease. It may be caused by one or both of these problems:  Your body does not make enough of a hormone called insulin.  Your body does not react in a normal way to insulin that it makes.  Insulin lets sugars (glucose) go into cells in the body. This gives you energy. If you have type 2 diabetes, sugars cannot get into cells. This causes high blood sugar (hyperglycemia). Your doctor will set treatment goals for you. Generally, you should have these blood sugar levels:  Before meals (preprandial): 80-130 mg/dL (4.4-7.2 mmol/L).  After meals (postprandial): below 180 mg/dL (10 mmol/L).  A1c (hemoglobin A1c) level: less than 7%.  Follow these instructions at home: Questions to Ask Your Doctor  You may want to ask these questions:  Do I need to meet with a diabetes educator?  Where can I find a support group for people with diabetes?  What equipment will I need to care for myself at home?  What diabetes medicines do I need? When should I take them?  How often do I need to check my blood sugar?  What number can  I call if I have questions?  When is my next doctor's visit?  General instructions  Take over-the-counter and prescription medicines only as told by your doctor.  Keep all follow-up visits as told by your doctor. This is important. Contact a doctor if:  Your blood sugar is at or above 240 mg/dL (13.3 mmol/L) for 2 days in a row.  You have been sick or have had a fever for 2 days or more and you are not getting better.  You have any of these problems for more than 6 hours: ? You cannot eat or drink. ? You feel sick to your stomach (nauseous). ? You throw up (vomit). ? You have watery poop (diarrhea). Get help right away if:  Your blood sugar is lower than 54 mg/dL (3 mmol/L).  You get confused.  You have trouble: ? Thinking clearly. ? Breathing.  You have moderate or large ketone levels in your pee (urine). This information is not intended to replace advice given to you by your health care provider. Make sure you discuss any questions you have with your health care provider. Document Released: 01/21/2008 Document Revised: 09/19/2015 Document Reviewed: 05/17/2015 Elsevier Interactive Patient Education  [employer].

## 2017-03-05 NOTE — Progress Notes (Signed)
Eric Hooper 41 y.o.   Chief Complaint  Patient presents with  . Follow-up    for diabetes    HISTORY OF PRESENT ILLNESS: This is a 41 y.o. male complaining of high sugar; has h/o DMT2 but was off Metformin since he was hospitalized recently because of thrombocytopenia that needed transfusion. Started Metformin 2 days ago on his own. Recent glucose was 406. Asymptomatic; presently on Prednisone.  HPI   Prior to Admission medications   Medication Sig Start Date End Date Taking? Authorizing Provider  amLODipine (NORVASC) 5 MG tablet Take 5 mg by mouth 2 (two) times daily. 01/22/17  Yes [provider]  Esomeprazole Magnesium (NEXIUM PO) Take 22.3-44.6 mg by mouth 2 (two) times daily.   Yes [provider]  folic acid (FOLVITE) 1 MG tablet Take 1 tablet (1 mg total) by mouth daily. 02/08/17  Yes Sheikh, Omair Latif, DO  Multiple Vitamin (MULTIVITAMIN WITH MINERALS) TABS tablet Take 1 tablet by mouth daily. 02/08/17  Yes Sheikh, Omair Latif, DO  predniSONE (DELTASONE) 20 MG tablet Take 2 tablets (40 mg total) daily with breakfast by mouth. 03/04/17  Yes Owens Shark, NP  ranitidine (ZANTAC) 150 MG tablet Take 1 tablet (150 mg total) by mouth 2 (two) times daily. 10/05/16  Yes Nat Christen, MD  thiamine 100 MG tablet Take 1 tablet (100 mg total) by mouth daily. 02/08/17  Yes Sheikh, Omair Latif, DO  albuterol (PROVENTIL HFA;VENTOLIN HFA) 108 (90 Base) MCG/ACT inhaler Inhale 2 puffs into the lungs every 6 (six) hours as needed for wheezing or shortness of breath.    [provider]  fluticasone (FLONASE) 50 MCG/ACT nasal spray Place 2 sprays into both nostrils daily. Patient not taking: Reported on 02/01/2017 12/22/16   Jeannett Senior, PA-C    No Known Allergies  Patient Active Problem List   Diagnosis Date Noted  . TTP (thrombotic thrombocytopenic purpura) ([place]) 02/06/2017  . Cough 02/05/2017  . Alcohol use 02/03/2017  . Renal insufficiency 02/03/2017  .  Leukocytosis 02/03/2017  . Thrombocytopenia ([place]) 02/01/2017  . Hypokalemia 02/01/2017  . Macrocytic anemia 02/01/2017  . Hyperglycemia 02/01/2017  . Microscopic hematuria 02/01/2017    Past Medical History:  Diagnosis Date  . Diabetes mellitus without complication (HCC)    non-insulin dependent  . Diverticulosis 02/01/2017   on CT scan abd/pelvis  . GERD (gastroesophageal reflux disease)   . Hypertension   . Renal insufficiency 02/03/2017    Past Surgical History:  Procedure Laterality Date  . ELBOW SURGERY Right   . IR FLUORO GUIDE CV LINE RIGHT  02/02/2017  . IR US GUIDE VASC ACCESS RIGHT  02/02/2017    Social History   Socioeconomic History  . Marital status: Single    Spouse name: Not on file  . Number of children: Not on file  . Years of education: Not on file  . Highest education level: Not on file  Social Needs  . Financial resource strain: Not on file  . Food insecurity - worry: Not on file  . Food insecurity - inability: Not on file  . Transportation needs - medical: Not on file  . Transportation needs - non-medical: Not on file  Occupational History  . Not on file  Tobacco Use  . Smoking status: Current Every Day Smoker    Packs/day: 0.50    Types: Cigarettes  . Smokeless tobacco: Never Used  Substance and Sexual Activity  . Alcohol use: Yes    Comment: socially  . Drug use:  No  . Sexual activity: Not on file  Other Topics Concern  . Not on file  Social History Narrative  . Not on file    Family History  Problem Relation Age of Onset  . Hypertension Mother   . Hyperlipidemia Mother      Review of Systems  Constitutional: Negative.  Negative for chills and fever.  HENT: Negative.  Negative for sore throat.   Eyes: Negative.  Negative for blurred vision and double vision.  Respiratory: Negative.  Negative for cough and shortness of breath.   Cardiovascular: Negative.  Negative for chest pain and palpitations.  Gastrointestinal: Negative.   Negative for abdominal pain, diarrhea, nausea and vomiting.  Genitourinary: Negative.  Negative for dysuria and hematuria.  Musculoskeletal: Negative.  Negative for myalgias and neck pain.  Skin: Negative.  Negative for rash.  Neurological: Negative.  Negative for dizziness and headaches.  Endo/Heme/Allergies: Negative.   All other systems reviewed and are negative.    Vitals:   03/05/17 1712  BP: 128/70  Pulse: 85  Resp: 16  Temp: 98.8 F (37.1 C)  SpO2: 98%     Physical Exam  Constitutional: He is oriented to person, place, and time. He appears well-developed and well-nourished.  HENT:  Head: Normocephalic.  Right Ear: External ear normal.  Left Ear: External ear normal.  Nose: Nose normal.  Mouth/Throat: Oropharynx is clear and moist.  Eyes: Conjunctivae and EOM are normal. Pupils are equal, round, and reactive to light.  Neck: Normal range of motion. Neck supple. No JVD present. No thyromegaly present.  Cardiovascular: Normal rate, regular rhythm, normal heart sounds and intact distal pulses.  Pulmonary/Chest: Effort normal and breath sounds normal.  Abdominal: Soft. Bowel sounds are normal. He exhibits no distension.  Musculoskeletal: Normal range of motion.  Lymphadenopathy:    He has no cervical adenopathy.  Neurological: He is alert and oriented to person, place, and time. No sensory deficit. He exhibits normal muscle tone.  Skin: Skin is warm and dry. Capillary refill takes less than 2 seconds. No rash noted.  Psychiatric: He has a normal mood and affect. His behavior is normal.  Vitals reviewed.    Results for orders placed or performed in visit on 03/05/17 (from the past 24 hour(s))  POCT glucose (manual entry)     Status: Abnormal   Collection Time: 03/05/17  5:55 PM  Result Value Ref Range   POC Glucose 247 (A) 70 - 99 mg/dl     ASSESSMENT & PLAN: Eric Hooper was seen today for follow-up.  Diagnoses and all orders for this visit:  Type 2 diabetes mellitus  without complication, without long-term current use of insulin (HCC) -     CBC with Differential/Platelet -     Comprehensive metabolic panel -     POCT glucose (manual entry)  Thrombocytopenia (HCC)  Other orders -     blood glucose meter kit and supplies KIT; Dispense based on patient and insurance preference. Use up to four times daily as directed. (FOR ICD-9 250.00, 250.01). -     metFORMIN (GLUCOPHAGE) 500 MG tablet; Take 1 tablet (500 mg total) 2 (two) times daily with a meal by mouth. -     folic acid (FOLVITE) 1 MG tablet; Take 1 tablet (1 mg total) daily by mouth. -     thiamine 100 MG tablet; Take 1 tablet (100 mg total) daily by mouth.    Patient Instructions       IF you received an x-ray today,  you will receive an invoice from [facility]. Please contact [facility] at [phone] with questions or concerns regarding your invoice.   IF you received labwork today, you will receive an invoice from [place]. Please contact [facility] at [phone] with questions or concerns regarding your invoice.   Our billing staff will not be able to assist you with questions regarding bills from these companies.  You will be contacted with the lab results as soon as they are available. The fastest way to get your results is to activate your My Chart account. Instructions are located on the last page of this paperwork. If you have not heard from [place] regarding the results in 2 weeks, please contact this office.     Type 2 Diabetes Mellitus, Diagnosis, Adult Type 2 diabetes (type 2 diabetes mellitus) is a long-term (chronic) disease. It may be caused by one or both of these problems:  Your body does not make enough of a hormone called insulin.  Your body does not react in a normal way to insulin that it makes.  Insulin lets sugars (glucose) go into cells in the body. This gives you energy. If you have type 2 diabetes, sugars cannot get into cells. This causes high  blood sugar (hyperglycemia). Your doctor will set treatment goals for you. Generally, you should have these blood sugar levels:  Before meals (preprandial): 80-130 mg/dL (4.4-7.2 mmol/L).  After meals (postprandial): below 180 mg/dL (10 mmol/L).  A1c (hemoglobin A1c) level: less than 7%.  Follow these instructions at home: Questions to Ask Your Doctor  You may want to ask these questions:  Do I need to meet with a diabetes educator?  Where can I find a support group for people with diabetes?  What equipment will I need to care for myself at home?  What diabetes medicines do I need? When should I take them?  How often do I need to check my blood sugar?  What number can I call if I have questions?  When is my next doctor's visit?  General instructions  Take over-the-counter and prescription medicines only as told by your doctor.  Keep all follow-up visits as told by your doctor. This is important. Contact a doctor if:  Your blood sugar is at or above 240 mg/dL (13.3 mmol/L) for 2 days in a row.  You have been sick or have had a fever for 2 days or more and you are not getting better.  You have any of these problems for more than 6 hours: ? You cannot eat or drink. ? You feel sick to your stomach (nauseous). ? You throw up (vomit). ? You have watery poop (diarrhea). Get help right away if:  Your blood sugar is lower than 54 mg/dL (3 mmol/L).  You get confused.  You have trouble: ? Thinking clearly. ? Breathing.  You have moderate or large ketone levels in your pee (urine). This information is not intended to replace advice given to you by your health care provider. Make sure you discuss any questions you have with your health care provider. Document Released: 01/21/2008 Document Revised: 09/19/2015 Document Reviewed: 05/17/2015 Elsevier Interactive Patient Education  2018 Elsevier Inc.      Agustina Caroli, MD [facility]

## 2017-03-06 LAB — COMPREHENSIVE METABOLIC PANEL
ALBUMIN: 4.5 g/dL (ref 3.5–5.5)
ALK PHOS: 81 IU/L (ref 39–117)
ALT: 28 IU/L (ref 0–44)
AST: 14 IU/L (ref 0–40)
Albumin/Globulin Ratio: 1.9 (ref 1.2–2.2)
BUN / CREAT RATIO: 21 — AB (ref 9–20)
BUN: 20 mg/dL (ref 6–24)
Bilirubin Total: 0.4 mg/dL (ref 0.0–1.2)
CALCIUM: 9.8 mg/dL (ref 8.7–10.2)
CO2: 24 mmol/L (ref 20–29)
CREATININE: 0.96 mg/dL (ref 0.76–1.27)
Chloride: 104 mmol/L (ref 96–106)
GFR, EST AFRICAN AMERICAN: 111 mL/min/{1.73_m2} (ref >59)
GFR, EST NON AFRICAN AMERICAN: 96 mL/min/{1.73_m2} (ref >59)
GLOBULIN, TOTAL: 2.4 g/dL (ref 1.5–4.5)
GLUCOSE: 259 mg/dL — AB (ref 65–99)
Potassium: 3.9 mmol/L (ref 3.5–5.2)
Sodium: 142 mmol/L (ref 134–144)
TOTAL PROTEIN: 6.9 g/dL (ref 6.0–8.5)

## 2017-03-06 LAB — CBC WITH DIFFERENTIAL/PLATELET
BASOS: 0 %
Basophils Absolute: 0 10*3/uL (ref 0.0–0.2)
EOS (ABSOLUTE): 0.1 10*3/uL (ref 0.0–0.4)
Eos: 1 %
HEMATOCRIT: 44.3 % (ref 37.5–51.0)
HEMOGLOBIN: 14.6 g/dL (ref 13.0–17.7)
IMMATURE GRANS (ABS): 0 10*3/uL (ref 0.0–0.1)
Immature Granulocytes: 0 %
LYMPHS ABS: 3.4 10*3/uL — AB (ref 0.7–3.1)
LYMPHS: 28 %
MCH: 33.6 pg — ABNORMAL HIGH (ref 26.6–33.0)
MCHC: 33 g/dL (ref 31.5–35.7)
MCV: 102 fL — ABNORMAL HIGH (ref 79–97)
MONOCYTES: 5 %
Monocytes Absolute: 0.6 10*3/uL (ref 0.1–0.9)
NEUTROS ABS: 7.8 10*3/uL — AB (ref 1.4–7.0)
Neutrophils: 66 %
Platelets: 208 10*3/uL (ref 150–379)
RBC: 4.35 x10E6/uL (ref 4.14–5.80)
RDW: 13.5 % (ref 12.3–15.4)
WBC: 11.8 10*3/uL — ABNORMAL HIGH (ref 3.4–10.8)

## 2017-03-08 ENCOUNTER — Other Ambulatory Visit: Payer: Self-pay | Admitting: Emergency Medicine

## 2017-03-08 DIAGNOSIS — D696 Thrombocytopenia, unspecified: Secondary | ICD-10-CM

## 2017-03-09 ENCOUNTER — Telehealth: Payer: Self-pay | Admitting: Emergency Medicine

## 2017-03-09 ENCOUNTER — Other Ambulatory Visit (HOSPITAL_BASED_OUTPATIENT_CLINIC_OR_DEPARTMENT_OTHER): Payer: BLUE CROSS/BLUE SHIELD

## 2017-03-09 ENCOUNTER — Encounter: Payer: Self-pay | Admitting: Nurse Practitioner

## 2017-03-09 ENCOUNTER — Ambulatory Visit (HOSPITAL_BASED_OUTPATIENT_CLINIC_OR_DEPARTMENT_OTHER): Payer: BLUE CROSS/BLUE SHIELD | Admitting: Nurse Practitioner

## 2017-03-09 ENCOUNTER — Telehealth: Payer: Self-pay | Admitting: Physician Assistant

## 2017-03-09 VITALS — BP 142/78 | HR 80 | Temp 98.2°F | Resp 18 | Ht 69.0 in | Wt 200.2 lb

## 2017-03-09 DIAGNOSIS — M311 Thrombotic microangiopathy: Secondary | ICD-10-CM

## 2017-03-09 DIAGNOSIS — N289 Disorder of kidney and ureter, unspecified: Secondary | ICD-10-CM | POA: Diagnosis not present

## 2017-03-09 DIAGNOSIS — D696 Thrombocytopenia, unspecified: Secondary | ICD-10-CM

## 2017-03-09 DIAGNOSIS — I10 Essential (primary) hypertension: Secondary | ICD-10-CM | POA: Diagnosis not present

## 2017-03-09 DIAGNOSIS — M3119 Other thrombotic microangiopathy: Secondary | ICD-10-CM

## 2017-03-09 DIAGNOSIS — E1165 Type 2 diabetes mellitus with hyperglycemia: Secondary | ICD-10-CM

## 2017-03-09 LAB — COMPREHENSIVE METABOLIC PANEL
ALT: 30 U/L (ref 0–55)
AST: 11 U/L (ref 5–34)
Albumin: 3.9 g/dL (ref 3.5–5.0)
Alkaline Phosphatase: 88 U/L (ref 40–150)
Anion Gap: 11 mEq/L (ref 3–11)
BUN: 17.7 mg/dL (ref 7.0–26.0)
CALCIUM: 9.5 mg/dL (ref 8.4–10.4)
CHLORIDE: 105 meq/L (ref 98–109)
CO2: 22 mEq/L (ref 22–29)
CREATININE: 1.5 mg/dL — AB (ref 0.7–1.3)
EGFR: >60 mL/min/{1.73_m2} (ref >60)
GLUCOSE: 574 mg/dL — AB (ref 70–140)
Potassium: 4.5 mEq/L (ref 3.5–5.1)
Sodium: 138 mEq/L (ref 136–145)
Total Bilirubin: 0.41 mg/dL (ref 0.20–1.20)
Total Protein: 6.7 g/dL (ref 6.4–8.3)

## 2017-03-09 LAB — CBC WITH DIFFERENTIAL/PLATELET
BASO%: 0.2 % (ref 0.0–2.0)
Basophils Absolute: 0 10*3/uL (ref 0.0–0.1)
EOS%: 0 % (ref 0.0–7.0)
Eosinophils Absolute: 0 10*3/uL (ref 0.0–0.5)
HCT: 41.5 % (ref 38.4–49.9)
HGB: 14 g/dL (ref 13.0–17.1)
LYMPH%: 13.6 % — AB (ref 14.0–49.0)
MCH: 33.9 pg — AB (ref 27.2–33.4)
MCHC: 33.7 g/dL (ref 32.0–36.0)
MCV: 100.6 fL — ABNORMAL HIGH (ref 79.3–98.0)
MONO#: 0.5 10*3/uL (ref 0.1–0.9)
MONO%: 5.9 % (ref 0.0–14.0)
NEUT#: 6.2 10*3/uL (ref 1.5–6.5)
NEUT%: 80.3 % — AB (ref 39.0–75.0)
Platelets: 212 10*3/uL (ref 140–400)
RBC: 4.13 10*6/uL — AB (ref 4.20–5.82)
RDW: 14.1 % (ref 11.0–14.6)
WBC: 7.7 10*3/uL (ref 4.0–10.3)
lymph#: 1 10*3/uL (ref 0.9–3.3)

## 2017-03-09 LAB — LACTATE DEHYDROGENASE: LDH: 202 U/L (ref 125–245)

## 2017-03-09 MED ORDER — FLUCONAZOLE 100 MG PO TABS: 100.0000 mg | ORAL_TABLET | Freq: Every day | ORAL | 0 refills | Status: DC

## 2017-03-09 MED ORDER — PANTOPRAZOLE SODIUM 40 MG PO TBEC: 40.0000 mg | DELAYED_RELEASE_TABLET | Freq: Every day | ORAL | 1 refills | Status: DC

## 2017-03-09 NOTE — Telephone Encounter (Signed)
LVM to call back for advise from Dr. Mitchel Honour.

## 2017-03-09 NOTE — Telephone Encounter (Signed)
Patient has only been taking 1 metformin daily- today CMET/ glucose- 574- do you want to give insulin now? Prednisone 40 mg and on taper to 30 mg  [phone] Sarna

## 2017-03-09 NOTE — Telephone Encounter (Signed)
I recommend he goes to the ED. He's never been on insulin and will need more than just insulin (labs, IVF's, etc).

## 2017-03-09 NOTE — Telephone Encounter (Signed)
Please advise 

## 2017-03-09 NOTE — Progress Notes (Addendum)
  [place] OFFICE PROGRESS NOTE  Diagnosis: TTP  INTERVAL HISTORY:   Eric Hooper returns as scheduled.  He continues prednisone 40 mg daily.  Recent labs showed marked hyperglycemia.  He was seen by Dr. Mitchel Hooper on 03/05/2017 and instructed to increase metformin to twice daily.  He is taking the metformin once a day in the mornings.  He notes increased thirst and frequent urination.  He denies any bleeding.  No unusual headaches or vision change.  Objective:  Vital signs in last 24 hours:  Blood pressure (!) 142/78, pulse 80, temperature 98.2 F (36.8 C), temperature source Oral, resp. rate 18, height 5\' 9"  (1.753 m), weight 200 lb 3.2 oz (90.8 kg), SpO2 97 %.    HEENT: Thrush. Resp: Lungs clear bilaterally. Cardio: Regular rate and rhythm. GI: Abdomen soft and nontender.  No hepatosplenomegaly. Vascular: No leg edema. Neuro: Alert and oriented.    Lab Results:  Lab Results  Component Value Date   WBC 7.7 03/09/2017   HGB 14.0 03/09/2017   HCT 41.5 03/09/2017   MCV 100.6 (H) 03/09/2017   PLT 212 03/09/2017   NEUTROABS 6.2 03/09/2017    Imaging:  No results found.  Medications: I have reviewed the patient's current medications.  Assessment/Plan: 1. TTP  Initiation of daily plasma exchange and Solu-Medrol 02/02/2017  [name] Antibody- 72, activity less than 2%  Last plasma exchange 02/06/2017  Prednisone 60 mg daily beginning 02/08/2017  Prednisone 40 mg daily beginning 02/17/2017  Prednisone 30 mg daily beginning 03/09/2017 2. Xiphoid pain-etiology unclear, improved 3. Hypertension 4. Diabetes 5. Alcohol/Tobacco use 6. Renal insufficiency 7. Right IJ dialysis catheter placed 02/02/2017, removed   Disposition: Eric Hooper remains stable from a hematologic standpoint.  We are decreasing the prednisone from 40 mg daily to 30 mg daily.  Blood sugars remain poorly controlled.  He is currently taking Metformin 500 mg daily.  We contacted  Dr. Barry Hooper office and received a recommendation to refer him to the emergency department.  We discussed this recommendation with Eric Hooper.  He declines to go to the emergency department stating he cannot miss any more work. We explained the rationale for evaluation in the emergency department. He again states he will not go to the ER.  We recommended he increase water intake, increase metformin to twice daily as previously prescribed, monitor blood sugars closely and follow-up with PCP tomorrow.  He will return for a lab appointment here in 1 week and labs and office visit in 2 weeks.  Patient seen with Dr. Benay Hooper.  25 minutes were spent face-to-face at today's visit with the majority of that time involved in counseling/coordination of care.    Ned Card ANP/GNP-BC   03/09/2017  3:52 PM This was a shared visit with Ned Card.  Eric Hooper is in clinical remission from TTP.  We tapered the prednisone to 30 mg daily.  The plan is to continue a weekly taper of the prednisone if the platelet count and hemoglobin remained stable.  He has severe hyperglycemia today.  This is secondary to diabetes, prednisone, and lack of a diabetic medical regimen.  We contacted Dr.Sagardia and he recommended we refer Eric Hooper to the emergency room.  Eric Hooper declined.  We recommended he follow a diabetic diet and monitor his blood sugar at home.  He will follow-up with Dr. Mitchel Hooper in the a.m. of 03/10/2017.  Eric Manson, MD

## 2017-03-09 NOTE — Telephone Encounter (Signed)
Called number and was extention to a hospital who did not have a record of the patient.

## 2017-03-09 NOTE — Telephone Encounter (Signed)
Called [facility] per Ned Card NP regarding pts hyperglycemia. BS is 574 here in our office today.  Pt reports only taking one metformin daily. Pt reports polyuria and polydipsia.  Rx was prescribed for two metformin daily by Osborn Coho MD. Triage nurse at [facility] is having desk nurse return this nurses call regarding the pts blood sugar. Will follow up.

## 2017-03-09 NOTE — Telephone Encounter (Signed)
Noted in epic from [place] that pt should go to ED per PCP. Ned Card, NP made aware of this. Return number for this nurse is [phone], Not [phone].

## 2017-03-16 ENCOUNTER — Telehealth: Payer: Self-pay | Admitting: Emergency Medicine

## 2017-03-16 ENCOUNTER — Other Ambulatory Visit: Payer: Self-pay | Admitting: Emergency Medicine

## 2017-03-16 ENCOUNTER — Other Ambulatory Visit: Payer: PRIVATE HEALTH INSURANCE

## 2017-03-16 NOTE — Telephone Encounter (Signed)
Pt insurance rejected Metformin stating diabetic drugs must be filled by mail order. Pt wants 1 month Metformin and will pay out of pocket until he can get the mail order set up.

## 2017-03-16 NOTE — Telephone Encounter (Signed)
Copied from [place] [id number]. Topic: Quick Communication - See Telephone Encounter >> Mar 16, 2017  2:35 PM Conception Chancy, NT wrote: CRM for notification. See Telephone encounter for:  03/16/17.  Pt family states he is out of metformin and needs it tonight, would like call back when it is sent to the pharmacy. Verdis Frederickson states she has already contacted pharmacy

## 2017-03-17 ENCOUNTER — Other Ambulatory Visit: Payer: PRIVATE HEALTH INSURANCE

## 2017-03-23 ENCOUNTER — Inpatient Hospital Stay: Payer: BLUE CROSS/BLUE SHIELD | Attending: Oncology | Admitting: Oncology

## 2017-03-23 ENCOUNTER — Ambulatory Visit (HOSPITAL_BASED_OUTPATIENT_CLINIC_OR_DEPARTMENT_OTHER): Payer: BLUE CROSS/BLUE SHIELD

## 2017-03-23 ENCOUNTER — Telehealth: Payer: Self-pay | Admitting: Oncology

## 2017-03-23 VITALS — BP 149/97 | HR 74 | Temp 98.1°F | Resp 20 | Ht 69.0 in | Wt 201.6 lb

## 2017-03-23 DIAGNOSIS — E119 Type 2 diabetes mellitus without complications: Secondary | ICD-10-CM | POA: Diagnosis not present

## 2017-03-23 DIAGNOSIS — M311 Thrombotic microangiopathy: Secondary | ICD-10-CM | POA: Diagnosis not present

## 2017-03-23 DIAGNOSIS — M3119 Other thrombotic microangiopathy: Secondary | ICD-10-CM

## 2017-03-23 DIAGNOSIS — D696 Thrombocytopenia, unspecified: Secondary | ICD-10-CM

## 2017-03-23 DIAGNOSIS — I10 Essential (primary) hypertension: Secondary | ICD-10-CM | POA: Diagnosis not present

## 2017-03-23 LAB — COMPREHENSIVE METABOLIC PANEL
ALBUMIN: 4.1 g/dL (ref 3.5–5.0)
ALK PHOS: 69 U/L (ref 40–150)
ALT: 35 U/L (ref 0–55)
AST: 14 U/L (ref 5–34)
Anion Gap: 11 mEq/L (ref 3–11)
BUN: 22.5 mg/dL (ref 7.0–26.0)
CALCIUM: 10.1 mg/dL (ref 8.4–10.4)
CO2: 24 mEq/L (ref 22–29)
CREATININE: 1.1 mg/dL (ref 0.7–1.3)
Chloride: 106 mEq/L (ref 98–109)
EGFR: >60 mL/min/{1.73_m2} (ref >60)
GLUCOSE: 193 mg/dL — AB (ref 70–140)
Potassium: 4 mEq/L (ref 3.5–5.1)
SODIUM: 141 meq/L (ref 136–145)
Total Bilirubin: 0.41 mg/dL (ref 0.20–1.20)
Total Protein: 7 g/dL (ref 6.4–8.3)

## 2017-03-23 LAB — CBC WITH DIFFERENTIAL/PLATELET
BASO%: 0.1 % (ref 0.0–2.0)
Basophils Absolute: 0 10*3/uL (ref 0.0–0.1)
EOS%: 0.1 % (ref 0.0–7.0)
Eosinophils Absolute: 0 10*3/uL (ref 0.0–0.5)
HEMATOCRIT: 44.9 % (ref 38.4–49.9)
HEMOGLOBIN: 15.4 g/dL (ref 13.0–17.1)
LYMPH#: 3.1 10*3/uL (ref 0.9–3.3)
LYMPH%: 22.5 % (ref 14.0–49.0)
MCH: 33.3 pg (ref 27.2–33.4)
MCHC: 34.3 g/dL (ref 32.0–36.0)
MCV: 97.2 fL (ref 79.3–98.0)
MONO#: 0.9 10*3/uL (ref 0.1–0.9)
MONO%: 6.4 % (ref 0.0–14.0)
NEUT%: 70.9 % (ref 39.0–75.0)
NEUTROS ABS: 9.7 10*3/uL — AB (ref 1.5–6.5)
PLATELETS: 183 10*3/uL (ref 140–400)
RBC: 4.62 10*6/uL (ref 4.20–5.82)
RDW: 12.9 % (ref 11.0–14.6)
WBC: 13.6 10*3/uL — AB (ref 4.0–10.3)

## 2017-03-23 MED ORDER — AMLODIPINE BESYLATE 5 MG PO TABS: 5.0000 mg | ORAL_TABLET | Freq: Every day | ORAL | 0 refills | Status: DC

## 2017-03-23 MED ORDER — PREDNISONE 20 MG PO TABS: 20.0000 mg | ORAL_TABLET | Freq: Every day | ORAL | 0 refills | Status: DC

## 2017-03-23 NOTE — Telephone Encounter (Signed)
Gave avs and calendar for December  °

## 2017-03-23 NOTE — Progress Notes (Signed)
  Eric Hooper   Diagnosis: TTP  INTERVAL HISTORY:   Eric Hooper returns as scheduled.  He is maintained on prednisone at a dose of 30 mg daily.  He reports the blood sugar has been under better control and measures usually at approximately 150.  He no longer has chest pain.  He feels well.  He is working.  Objective:  Vital signs in last 24 hours:  Blood pressure (!) 149/97, pulse 74, temperature 98.1 F (36.7 C), temperature source Oral, resp. rate 20, height 5\' 9"  (1.753 m), weight 201 lb 9.6 oz (91.4 kg), SpO2 99 %.    HEENT: Mild white coat over the tongue, no buccal thrush Resp: Lungs clear bilaterally Cardio: Regular rate and rhythm GI: No hepatosplenomegaly, nontender Vascular: Leg edema   Lab Results:  Lab Results  Component Value Date   WBC 13.6 (H) 03/23/2017   HGB 15.4 03/23/2017   HCT 44.9 03/23/2017   MCV 97.2 03/23/2017   PLT 183 03/23/2017   NEUTROABS 9.7 (H) 03/23/2017    CMP     Component Value Date/Time   NA 141 03/23/2017 1612   K 4.0 03/23/2017 1612   CL 104 03/05/2017 1738   CO2 24 03/23/2017 1612   GLUCOSE 193 (H) 03/23/2017 1612   BUN 22.5 03/23/2017 1612   CREATININE 1.1 03/23/2017 1612   CALCIUM 10.1 03/23/2017 1612   PROT 7.0 03/23/2017 1612   ALBUMIN 4.1 03/23/2017 1612   AST 14 03/23/2017 1612   ALT 35 03/23/2017 1612   ALKPHOS 69 03/23/2017 1612   BILITOT 0.41 03/23/2017 1612   GFRNONAA 96 03/05/2017 1738   GFRAA 111 03/05/2017 1738      Medications: I have reviewed the patient's current medications.  Assessment/Plan: 1. TTP  Initiation of daily plasma exchange and Solu-Medrol 02/02/2017  [name] Antibody- 72, activity less than 2%  Last plasma exchange 02/06/2017  Prednisone 60 mg daily beginning 02/08/2017  Prednisone 40 mg daily beginning 02/17/2017  Prednisone 30 mg daily beginning 03/09/2017  Prednisone 20 mg daily beginning 03/23/2017 2. Xiphoid pain-etiology unclear,  resolved 3. Hypertension 4. Diabetes 5. Alcohol/Tobacco use 6. History of renal insufficiency 7. Right IJ dialysis catheter placed 02/02/2017, removed   Disposition:  Eric Hooper is in clinical remission from TTP.  We tapered the prednisone to 20 mg daily.  He will return for a lab visit 04/01/2017.  The plan is to taper the prednisone to 10 mg daily if the hemoglobin and platelet count remain in the normal range.  He is scheduling an appointment with a primary provider for management of diabetes.  Eric Hooper will return for an office visit on 04/15/2017.  15 minutes were spent with the patient today.  The majority of the time was used for counseling and coordination of care.  [name], MD  03/23/2017  5:10 PM

## 2017-03-24 ENCOUNTER — Telehealth: Payer: Self-pay | Admitting: Oncology

## 2017-03-24 LAB — LACTATE DEHYDROGENASE: LDH: 206 U/L (ref 125–245)

## 2017-03-24 NOTE — Telephone Encounter (Signed)
Called and left a message with wife regarding 12/20

## 2017-04-01 ENCOUNTER — Telehealth: Payer: Self-pay | Admitting: *Deleted

## 2017-04-01 ENCOUNTER — Other Ambulatory Visit (HOSPITAL_BASED_OUTPATIENT_CLINIC_OR_DEPARTMENT_OTHER): Payer: BLUE CROSS/BLUE SHIELD

## 2017-04-01 DIAGNOSIS — M3119 Other thrombotic microangiopathy: Secondary | ICD-10-CM

## 2017-04-01 DIAGNOSIS — M311 Thrombotic microangiopathy: Secondary | ICD-10-CM | POA: Diagnosis not present

## 2017-04-01 LAB — CBC WITH DIFFERENTIAL/PLATELET
BASO%: 0.2 % (ref 0.0–2.0)
BASOS ABS: 0 10*3/uL (ref 0.0–0.1)
EOS ABS: 0 10*3/uL (ref 0.0–0.5)
EOS%: 0.4 % (ref 0.0–7.0)
HEMATOCRIT: 45 % (ref 38.4–49.9)
HEMOGLOBIN: 15.6 g/dL (ref 13.0–17.1)
LYMPH#: 3.7 10*3/uL — AB (ref 0.9–3.3)
LYMPH%: 33.2 % (ref 14.0–49.0)
MCH: 33.4 pg (ref 27.2–33.4)
MCHC: 34.7 g/dL (ref 32.0–36.0)
MCV: 96.4 fL (ref 79.3–98.0)
MONO#: 1 10*3/uL — AB (ref 0.1–0.9)
MONO%: 8.6 % (ref 0.0–14.0)
NEUT#: 6.4 10*3/uL (ref 1.5–6.5)
NEUT%: 57.6 % (ref 39.0–75.0)
PLATELETS: 169 10*3/uL (ref 140–400)
RBC: 4.67 10*6/uL (ref 4.20–5.82)
RDW: 12.9 % (ref 11.0–14.6)
WBC: 11.1 10*3/uL — ABNORMAL HIGH (ref 4.0–10.3)

## 2017-04-01 NOTE — Telephone Encounter (Signed)
Duplicate

## 2017-04-01 NOTE — Telephone Encounter (Signed)
           Please call patient, platelets are normal, decrease prednisone to 10mg  daily, f/u as scheduled    Called patient to give above advice from Dr.Sherrill  patient and mother verbalized understanding.

## 2017-04-13 ENCOUNTER — Encounter: Payer: Self-pay | Admitting: Family Medicine

## 2017-04-13 ENCOUNTER — Ambulatory Visit (INDEPENDENT_AMBULATORY_CARE_PROVIDER_SITE_OTHER): Payer: BLUE CROSS/BLUE SHIELD | Admitting: Family Medicine

## 2017-04-13 VITALS — BP 132/98 | HR 64 | Ht 70.0 in | Wt 202.0 lb

## 2017-04-13 DIAGNOSIS — F17200 Nicotine dependence, unspecified, uncomplicated: Secondary | ICD-10-CM | POA: Diagnosis not present

## 2017-04-13 DIAGNOSIS — K5790 Diverticulosis of intestine, part unspecified, without perforation or abscess without bleeding: Secondary | ICD-10-CM | POA: Diagnosis not present

## 2017-04-13 DIAGNOSIS — I10 Essential (primary) hypertension: Secondary | ICD-10-CM | POA: Diagnosis not present

## 2017-04-13 DIAGNOSIS — F1090 Alcohol use, unspecified, uncomplicated: Secondary | ICD-10-CM

## 2017-04-13 DIAGNOSIS — E1165 Type 2 diabetes mellitus with hyperglycemia: Secondary | ICD-10-CM | POA: Diagnosis not present

## 2017-04-13 DIAGNOSIS — Z789 Other specified health status: Secondary | ICD-10-CM | POA: Diagnosis not present

## 2017-04-13 DIAGNOSIS — Z7289 Other problems related to lifestyle: Secondary | ICD-10-CM

## 2017-04-13 DIAGNOSIS — K219 Gastro-esophageal reflux disease without esophagitis: Secondary | ICD-10-CM

## 2017-04-13 DIAGNOSIS — R1013 Epigastric pain: Secondary | ICD-10-CM

## 2017-04-13 HISTORY — DX: Nicotine dependence, unspecified, uncomplicated: F17.200

## 2017-04-13 LAB — POCT URINALYSIS DIP (PROADVANTAGE DEVICE)
Blood, UA: NEGATIVE
GLUCOSE UA: NEGATIVE mg/dL
Leukocytes, UA: NEGATIVE
NITRITE UA: NEGATIVE
Specific Gravity, Urine: 1.03
UUROB: 3.5
pH, UA: 6 (ref 5.0–8.0)

## 2017-04-13 LAB — POCT GLYCOSYLATED HEMOGLOBIN (HGB A1C): Hemoglobin A1C: 9.6

## 2017-04-13 MED ORDER — METFORMIN HCL 1000 MG PO TABS: 1000.0000 mg | ORAL_TABLET | Freq: Two times a day (BID) | ORAL | 1 refills | Status: DC

## 2017-04-13 NOTE — Patient Instructions (Signed)
Start checking your blood sugars every morning before breakfast.  Your goal fasting blood sugar is 80-130.   I am increasing your metformin to 1000 mg twice daily.  Cut back on carbohydrates and sugar. You should receive a phone call from the nutritionist to schedule an appointment  I am also referring you to GI and they will call you to schedule an appointment.  Stop smoking.  Cut back on alcohol use.  Buy a blood pressure cuff and start checking your blood pressure daily for the next 2 weeks.  Return in 2 weeks fasting, nothing to eat or drink except water for 6 hours.  Bring in your blood pressure cuff, blood pressure readings and blood sugar readings when you return in 2 weeks.

## 2017-04-13 NOTE — Progress Notes (Signed)
Subjective:    [name], male    DOB: Jan 24, 1976, 41 y.o.   MRN: [id number]  HPI Chief Complaint  Patient presents with  . new patient get established   He is new to the practice and here to establish care. His fiance is with him today. Moved here from [place] one year ago.  Diagnosed with TTP recently and is being followed closely by  Dr. Benay Spice oncologist.  He is taking prednisone and states he is being weaned off of this.   Diabetes diagnosis one year ago in [place]. He is checking his blood sugars 2-3 times per week.  States fasting blood sugar this morning was 201.  He is taking Metformin 500 mg twice daily. Reports good compliance. He does not appear to have much knowledge about this disease. He does not know what a hemoglobin A1c is. Denies unexplained weight loss, blurred or double vision, increased thirst or urination. No nausea, vomiting.   HTN- diagnosed 10 years ago. Checks his blood pressure every now and then. Reports having problems with one medication and he cannot recall the name but thinks this may have been Lisinopril.  We will need to get his records from [place].   States he had an EGD and colonoscopy last year in [place]. We will need to get these records. He would like to establish with GI here due to persistent epigastric pain and GERD.   States he has taken heavy doses of Ibuprofen in the past for abdominal pain and he was told this may have caused his TTP.    States he has been smoking for 15 years, 1/2 ppd. Drinks 3-4 drinks per day most days of the week.   Denies fever, chills, dizziness, chest pain, palpitations, shortness of breath, abdominal pain, N/V/D, urinary symptoms, LE edema.   Reviewed allergies, medications, past medical, surgical,  and social history.   Review of Systems Pertinent positives and negatives in the history of present illness.     Objective:   Physical Exam BP (!) 132/98   Pulse 64   Ht 5\' 10"  (1.778 m)   Wt 202 lb (91.6 kg)    SpO2 98%   BMI 28.98 kg/m  Alert and in no distress.  Pharyngeal area is normal. MMM.Neck is supple without adenopathy or thyromegaly. Cardiac exam shows a regular sinus rhythm without murmurs or gallops. Lungs are clear to auscultation. Abdomen soft, non distended, normal BS, mild tenderness to epigastric region without rebound or referred pain, otherwise nontender.  Extremities without edema.  Skin warm and dry, no pallor.  PERRLA, EOMs intact.  CN II through XII intact.      Assessment & Plan:  Uncontrolled type 2 diabetes mellitus with hyperglycemia ([place]) - Plan: metFORMIN (GLUCOPHAGE) 1000 MG tablet, POCT Urinalysis DIP (Proadvantage Device), POCT glycosylated hemoglobin (Hb A1C), Microalbumin / creatinine urine ratio, Amb ref to Medical Nutrition Therapy-MNT  Gastroesophageal reflux disease, esophagitis presence not specified - Plan: Ambulatory referral to Gastroenterology  Diverticulosis of intestine without bleeding, unspecified intestinal tract location - Plan: Ambulatory referral to Gastroenterology  Essential hypertension - Plan: POCT Urinalysis DIP (Proadvantage Device), Amb ref to Medical Nutrition Therapy-MNT  Alcohol use  Smoker  Epigastric abdominal pain of unknown etiology - Plan: Ambulatory referral to Gastroenterology  Hemoglobin A1c 9.6 In depth counseling on diabetes spectrum done.  Counseled on healthy diet, exercise and lifestyle in order to manage his diabetes and hypertension. Referral made to nutritionist.  He is in agreement. Plan to increase  metformin dose and have him keep a close eye on his daily blood sugars.  He reports that he will be weaning off of prednisone very soon and this will also improve blood sugars most likely. He is aware that his blood pressure is not in goal range today.  Encouraged him to purchase a blood pressure cuff and start checking it on a daily basis for the next 2 weeks.  Counseled on low-sodium diet. Strongly encouraged him to  stop smoking and to cut back on alcohol intake. Discussed cardiac risk factors. Persistent epigastric pain.  Per patient request, referral to GI made.  Reports having an EGD and colonoscopy in [place] last year.  We will attempt to get these records as well as records from his primary care provider. Plan to have him return in 2 weeks fasting and we will check his lipids at that time.  I also requested that he bring in his blood pressure readings, blood sugar readings and blood pressure cuff. Spent a minimum of 45 minutes face-to-face with patient and at least 50% was in counseling and coordination of care.

## 2017-04-14 ENCOUNTER — Encounter: Payer: Self-pay | Admitting: Gastroenterology

## 2017-04-14 LAB — MICROALBUMIN / CREATININE URINE RATIO
Creatinine, Urine: 407 mg/dL — ABNORMAL HIGH (ref 20–320)
Microalb Creat Ratio: 9 mcg/mg creat (ref <30)
Microalb, Ur: 3.5 mg/dL

## 2017-04-15 ENCOUNTER — Other Ambulatory Visit: Payer: BLUE CROSS/BLUE SHIELD

## 2017-04-15 ENCOUNTER — Ambulatory Visit: Payer: BLUE CROSS/BLUE SHIELD | Admitting: Oncology

## 2017-04-29 ENCOUNTER — Telehealth: Payer: Self-pay | Admitting: Family Medicine

## 2017-04-29 ENCOUNTER — Ambulatory Visit: Payer: BLUE CROSS/BLUE SHIELD | Admitting: Family Medicine

## 2017-04-29 NOTE — Telephone Encounter (Signed)
He will need on office visit.

## 2017-04-29 NOTE — Telephone Encounter (Signed)
Eric Hooper called & states pt would like something called in to help him stop smoking.

## 2017-05-03 ENCOUNTER — Other Ambulatory Visit: Payer: Self-pay | Admitting: Emergency Medicine

## 2017-05-03 NOTE — Telephone Encounter (Signed)
Eric Hooper informed & he will discuss at next office visit

## 2017-05-04 NOTE — Telephone Encounter (Signed)
Unclear if pt is still being seen by Dr. Dara Lords as PCP.

## 2017-05-06 ENCOUNTER — Telehealth: Payer: Self-pay | Admitting: Family Medicine

## 2017-05-06 NOTE — Telephone Encounter (Signed)
Rcvd office notes from [facility], Ardelle Lesches, [place]

## 2017-05-11 ENCOUNTER — Encounter: Payer: Self-pay | Admitting: Oncology

## 2017-05-11 ENCOUNTER — Inpatient Hospital Stay: Payer: Commercial Managed Care - PPO | Attending: Oncology

## 2017-05-11 ENCOUNTER — Inpatient Hospital Stay (HOSPITAL_BASED_OUTPATIENT_CLINIC_OR_DEPARTMENT_OTHER): Payer: Commercial Managed Care - PPO | Admitting: Oncology

## 2017-05-11 ENCOUNTER — Telehealth: Payer: Self-pay | Admitting: Oncology

## 2017-05-11 VITALS — BP 140/100 | HR 86 | Temp 98.4°F | Resp 18 | Ht 70.0 in | Wt 203.4 lb

## 2017-05-11 DIAGNOSIS — M311 Thrombotic microangiopathy: Secondary | ICD-10-CM | POA: Diagnosis not present

## 2017-05-11 DIAGNOSIS — M3119 Other thrombotic microangiopathy: Secondary | ICD-10-CM

## 2017-05-11 DIAGNOSIS — I10 Essential (primary) hypertension: Secondary | ICD-10-CM | POA: Diagnosis not present

## 2017-05-11 DIAGNOSIS — E119 Type 2 diabetes mellitus without complications: Secondary | ICD-10-CM | POA: Insufficient documentation

## 2017-05-11 LAB — COMPREHENSIVE METABOLIC PANEL
ALK PHOS: 74 U/L (ref 40–150)
ALT: 35 U/L (ref 0–55)
ANION GAP: 9 (ref 3–11)
AST: 16 U/L (ref 5–34)
Albumin: 4.2 g/dL (ref 3.5–5.0)
BILIRUBIN TOTAL: 0.6 mg/dL (ref 0.2–1.2)
BUN: 13 mg/dL (ref 7–26)
CALCIUM: 10.3 mg/dL (ref 8.4–10.4)
CO2: 27 mmol/L (ref 22–29)
Chloride: 105 mmol/L (ref 98–109)
Creatinine, Ser: 1 mg/dL (ref 0.70–1.30)
GFR calc non Af Amer: >60 mL/min (ref >60)
Glucose, Bld: 140 mg/dL (ref 70–140)
Potassium: 3.8 mmol/L (ref 3.5–5.1)
SODIUM: 141 mmol/L (ref 136–145)
TOTAL PROTEIN: 7.3 g/dL (ref 6.4–8.3)

## 2017-05-11 LAB — CBC WITH DIFFERENTIAL/PLATELET
BASOS ABS: 0 10*3/uL (ref 0.0–0.1)
BASOS PCT: 0 %
Eosinophils Absolute: 0.1 10*3/uL (ref 0.0–0.5)
Eosinophils Relative: 1 %
HCT: 47 % (ref 38.4–49.9)
HEMOGLOBIN: 16 g/dL (ref 13.0–17.1)
Lymphocytes Relative: 37 %
Lymphs Abs: 4.7 10*3/uL — ABNORMAL HIGH (ref 0.9–3.3)
MCH: 30.9 pg (ref 27.2–33.4)
MCHC: 34 g/dL (ref 32.0–36.0)
MCV: 90.9 fL (ref 79.3–98.0)
Monocytes Absolute: 0.8 10*3/uL (ref 0.1–0.9)
Monocytes Relative: 6 %
NEUTROS ABS: 7.2 10*3/uL — AB (ref 1.5–6.5)
NEUTROS PCT: 56 %
Platelets: 185 10*3/uL (ref 140–400)
RBC: 5.17 MIL/uL (ref 4.20–5.82)
RDW: 13.5 % (ref 11.0–15.6)
WBC: 12.8 10*3/uL — AB (ref 4.0–10.3)

## 2017-05-11 LAB — LACTATE DEHYDROGENASE: LDH: 178 U/L (ref 125–245)

## 2017-05-11 MED ORDER — PREDNISONE 5 MG PO TABS: ORAL_TABLET | ORAL | 0 refills | Status: DC

## 2017-05-11 NOTE — Addendum Note (Signed)
Addended by: Brien Few on: 05/11/2017 04:50 PM   Modules accepted: Orders

## 2017-05-11 NOTE — Telephone Encounter (Signed)
Scheduled appt per 1/15 los- Gave patient aVS and calender per los.

## 2017-05-11 NOTE — Progress Notes (Signed)
  Eric Hooper OFFICE PROGRESS NOTE   Diagnosis: TTP  INTERVAL HISTORY:   Eric Hooper returns as scheduled.  He has been maintained on prednisone at a dose of 10 mg daily since 04/01/2017.  He feels well.  He is working.  He has occasional "heartburn" at night.  He reports compliance with his blood pressure medication.  No bleeding or fever.  Objective:  Vital signs in last 24 hours:  Blood pressure (!) 146/104, pulse 86, temperature 98.4 F (36.9 C), temperature source Oral, resp. rate 18, height 5\' 10"  (1.778 m), weight 203 lb 6.4 oz (92.3 kg), SpO2 100 %.    HEENT: No thrush Resp: Lungs clear bilaterally Cardio: Regular rate and rhythm GI: No hepatosplenomegaly Vascular: No leg edema    Lab Results:  Lab Results  Component Value Date   WBC 12.8 (H) 05/11/2017   HGB 16.0 05/11/2017   HCT 47.0 05/11/2017   MCV 90.9 05/11/2017   PLT 185 05/11/2017   NEUTROABS 7.2 (H) 05/11/2017    CMP     Component Value Date/Time   NA 141 05/11/2017 1526   NA 141 03/23/2017 1612   K 3.8 05/11/2017 1526   K 4.0 03/23/2017 1612   CL 105 05/11/2017 1526   CO2 27 05/11/2017 1526   CO2 24 03/23/2017 1612   GLUCOSE 140 05/11/2017 1526   GLUCOSE 193 (H) 03/23/2017 1612   BUN 13 05/11/2017 1526   BUN 22.5 03/23/2017 1612   CREATININE 1.00 05/11/2017 1526   CREATININE 1.1 03/23/2017 1612   CALCIUM 10.3 05/11/2017 1526   CALCIUM 10.1 03/23/2017 1612   PROT 7.3 05/11/2017 1526   PROT 7.0 03/23/2017 1612   ALBUMIN 4.2 05/11/2017 1526   ALBUMIN 4.1 03/23/2017 1612   AST 16 05/11/2017 1526   AST 14 03/23/2017 1612   ALT 35 05/11/2017 1526   ALT 35 03/23/2017 1612   ALKPHOS 74 05/11/2017 1526   ALKPHOS 69 03/23/2017 1612   BILITOT 0.6 05/11/2017 1526   BILITOT 0.41 03/23/2017 1612   GFRNONAA >60 05/11/2017 1526   GFRAA >60 05/11/2017 1526   LDH-178  Medications: I have reviewed the patient's current medications.   Assessment/Plan: 1. TTP  Initiation of daily  plasma exchange and Solu-Medrol 02/02/2017  [name] Antibody- 72, activity less than 2%  Last plasma exchange 02/06/2017  Prednisone 60 mg daily beginning 02/08/2017  Prednisone 40 mg daily beginning 02/17/2017  Prednisone 30 mg daily beginning 03/09/2017  Prednisone 20 mg daily beginning 03/23/2017  Prednisone 10 mg daily beginning 04/01/2017  Prednisone 5 mg daily for 5 days then 5 mg every other day for 5 doses then stop beginning 05/11/2017 2. Xiphoid pain-etiology unclear, resolved 3. Hypertension 4. Diabetes 5. Alcohol/Tobacco use 6. History of renal insufficiency 7. Right IJ dialysis catheter placed 02/02/2017, removed  Disposition: Eric Hooper remains in clinical remission from TTP.  The prednisone will be tapered to off over the next few weeks.  He will return for a lab visit in 2 months and an office visit in 4 months. He will follow-up with his primary provider for management of diabetes and hypertension.  15 minutes were spent with the patient today.  The majority of the time was used for counseling and coordination of care.  [name], MD  05/11/2017  4:20 PM

## 2017-05-11 NOTE — Patient Instructions (Signed)
Dr. Benay Spice recommends you see your primary care provider regarding high blood pressure.

## 2017-05-12 ENCOUNTER — Ambulatory Visit (HOSPITAL_COMMUNITY)
Admission: RE | Admit: 2017-05-12 | Discharge: 2017-05-12 | Disposition: A | Discharge status: Home / Self Care | Payer: Commercial Managed Care - PPO | Source: Ambulatory Visit | Attending: Medical | Admitting: Medical

## 2017-05-12 ENCOUNTER — Ambulatory Visit (INDEPENDENT_AMBULATORY_CARE_PROVIDER_SITE_OTHER): Payer: Commercial Managed Care - PPO | Admitting: Medical

## 2017-05-12 ENCOUNTER — Encounter: Payer: Self-pay | Admitting: Medical

## 2017-05-12 VITALS — BP 134/80 | HR 87 | Temp 98.0°F | Wt 198.0 lb

## 2017-05-12 DIAGNOSIS — I809 Phlebitis and thrombophlebitis of unspecified site: Secondary | ICD-10-CM | POA: Diagnosis not present

## 2017-05-12 DIAGNOSIS — M311 Thrombotic microangiopathy: Secondary | ICD-10-CM | POA: Diagnosis not present

## 2017-05-12 DIAGNOSIS — M7989 Other specified soft tissue disorders: Secondary | ICD-10-CM

## 2017-05-12 DIAGNOSIS — F17200 Nicotine dependence, unspecified, uncomplicated: Secondary | ICD-10-CM

## 2017-05-12 DIAGNOSIS — N289 Disorder of kidney and ureter, unspecified: Secondary | ICD-10-CM | POA: Diagnosis not present

## 2017-05-12 DIAGNOSIS — M3119 Other thrombotic microangiopathy: Secondary | ICD-10-CM

## 2017-05-12 HISTORY — DX: Phlebitis and thrombophlebitis of unspecified site: I80.9

## 2017-05-12 HISTORY — DX: Other specified soft tissue disorders: M79.89

## 2017-05-12 NOTE — Progress Notes (Signed)
Right lower extremity venous duplex completed. No evidence if DVT,superficial thrombosis, or Baker's cyst. Toma Copier, RVS 05/12/2017 5:42 PM

## 2017-05-12 NOTE — Patient Instructions (Signed)
Phlebitis Phlebitis is soreness and puffiness (swelling) in a vein. Follow these instructions at home:  Only take medicine as told by your doctor.  Raise (elevate) the affected limb on a pillow as told by your doctor.  Keep a warm pack on the affected vein as told by your doctor. Do not sleep with a heating pad.  Use special stockings or bandages around the area of the affected vein as told by your doctor. These will speed healing and keep the condition from coming back.  Talk to your doctor about all the medicines you take.  Get follow-up blood tests as told by your doctor.  If the phlebitis is in your legs: ? Avoid standing or resting for long periods. ? Keep your legs moving. Raise your legs when you sit or lie.  Do not smoke.  Follow-up with your doctor as told. Contact a doctor if:  You have strange bruises or bleeding.  Your puffiness or pain in the affected area is not getting better.  You are taking medicine to lessen puffiness (anti-inflammatory medicine), and you get belly pain.  You have a fever. Get help right away if:  The phlebitis gets worse and you have more pain, puffiness (swelling), or redness.  You have trouble breathing or have chest pain. This information is not intended to replace advice given to you by your health care provider. Make sure you discuss any questions you have with your health care provider. Document Released: 04/01/2009 Document Revised: 09/19/2015 Document Reviewed: 12/19/2012 Elsevier Interactive Patient Education  2017 [employer].

## 2017-05-12 NOTE — Progress Notes (Signed)
Subjective: Chief Complaint  Patient presents with  . Leg Pain    knot on leg rt  notice it this morning, soft knot    Here today accompanied by his significant other.  This is my first time seeing him.  He normally sees Jocelyn Lamer, Designer, jewellery here.  His medical history is significant for recent diagnosis of TTP, plasmapheresis and sees hematology for this currently on steroid for this.  Diabetes diagnosed within the past year, renal insufficiency, smoker, hypertension.  In the past few days she started having puffiness in tenderness of the right lower leg and anterior lower leg.  He notes the pain is 6/10.  He does have pain when he ambulates, the pain is localized to the lower leg.  He denies any recent trauma or fall or injury.  No fever.  No nausea or vomiting.  No prior similar.  No history of DVT, no history of phlebitis no history of cellulitis or abscess.  No history of varicose veins.  No other aggravating or relieving factors. No other complaint.  Past Medical History:  Diagnosis Date  . Diabetes mellitus without complication (HCC)    non-insulin dependent  . Diverticulosis 02/01/2017   on CT scan abd/pelvis  . GERD (gastroesophageal reflux disease)   . Hypertension   . Renal insufficiency 02/03/2017  . Smoker 04/13/2017   Current Outpatient Medications on File Prior to Visit  Medication Sig Dispense Refill  . amLODipine (NORVASC) 5 MG tablet Take 1 tablet (5 mg total) by mouth daily. 30 tablet 0  . blood glucose meter kit and supplies KIT Dispense based on patient and insurance preference. Use up to four times daily as directed. (FOR ICD-9 250.00, 250.01). 1 each 0  . metFORMIN (GLUCOPHAGE) 1000 MG tablet Take 1 tablet (1,000 mg total) by mouth 2 (two) times daily with a meal. 60 tablet 1  . Multiple Vitamin (MULTIVITAMIN WITH MINERALS) TABS tablet Take 1 tablet by mouth daily. 30 tablet 0  . pantoprazole (PROTONIX) 40 MG tablet Take 1 tablet (40 mg total) daily by  mouth. 30 tablet 1  . predniSONE (DELTASONE) 5 MG tablet Take one tablet (32m) daily for 5 days then take one tablet (554m every other day for 5 doses then stop. 10 tablet 0  . RA VITAMIN B-1 100 MG TABS take 1 tablet by mouth once daily 30 tablet 1   No current facility-administered medications on file prior to visit.    ROS as in subjective  Objective: BP 134/80   Pulse 87   Wt 198 lb (89.8 kg)   SpO2 96%   BMI 28.41 kg/m   Gen: wd, wn, nad, strong tobacco odor in the room Lungs clear No calve tenderness or palpable cord. Right lower leg laterally and anteriorly with 2 areas of puffy swelling slight purplish discoloration on the lateral side with tenderness in both areas suggestive of phlebitis, but no palpable cord, no induration erythema or warmth, no fluctuance.  No other obvious abnormality.  There is a linear rectangular scar on the right anterior lower leg midline from prior old injury.   Rest of legs nontender There is asymmetry of right calve compared to left, right calve 14.3in diameter vs 13.8 inch diameter left calve Pulses 1+ pedal pulses, normal cap refill.    Assessment Encounter Diagnoses  Name Primary?  . Phlebitis   . Leg swelling   . TTP (thrombotic thrombocytopenic purpura) (HCC) Yes  . Renal insufficiency   . Smoker  Plan: I reviewed through the recent chart history notes, reviewed his latest HgbA1C, CMET, CBC, reviewed recent hematology notes.  His diabetes is uncontrolled.   He is also on steroid currently for TTP.  Exam findings suggest phlebitis, but his right calve is asymmetrically slightly swollen compared to left.   We will check doppler US to rule out DVT.    His exam findings and symptoms suggest phlebitis.  Advised leg elevation, localized heat, can use compression with Ace wrap, in with hope to see the improve over the next 4-5 days.    Advised if any signs of infection such as worse swelling, worse pain, fever, redness, hard red sore,  then call or come in right away.  Advised smoking cessation   Friend was seen today for leg pain.  Diagnoses and all orders for this visit:  TTP (thrombotic thrombocytopenic purpura) (HCC) -     US Venous Img Lower Unilateral Left; Future  Phlebitis -     US Venous Img Lower Unilateral Left; Future  Leg swelling -     US Venous Img Lower Unilateral Left; Future  Renal insufficiency -     US Venous Img Lower Unilateral Left; Future  Smoker -     US Venous Img Lower Unilateral Left; Future     

## 2017-05-17 ENCOUNTER — Other Ambulatory Visit: Payer: Self-pay

## 2017-05-17 MED ORDER — AMLODIPINE BESYLATE 5 MG PO TABS: 5.0000 mg | ORAL_TABLET | Freq: Every day | ORAL | 0 refills | Status: DC

## 2017-05-18 ENCOUNTER — Ambulatory Visit: Payer: BLUE CROSS/BLUE SHIELD | Admitting: Registered"

## 2017-05-24 ENCOUNTER — Ambulatory Visit (INDEPENDENT_AMBULATORY_CARE_PROVIDER_SITE_OTHER): Payer: Commercial Managed Care - PPO | Admitting: Family Medicine

## 2017-05-24 ENCOUNTER — Encounter: Payer: Self-pay | Admitting: Family Medicine

## 2017-05-24 VITALS — BP 130/90 | HR 89 | Wt 206.0 lb

## 2017-05-24 DIAGNOSIS — E1165 Type 2 diabetes mellitus with hyperglycemia: Secondary | ICD-10-CM

## 2017-05-24 NOTE — Patient Instructions (Signed)
Return in 2 months FASTING (nothing to eat or drink except water for 6 hours).

## 2017-05-24 NOTE — Progress Notes (Signed)
   Subjective:    [name], male    DOB: 1975-10-18, 42 y.o.   MRN: [id number]  HPI Chief Complaint  Patient presents with  . follow-up    follow-up on Dm and discuss stop smoking. reading- 95-130 but last night was 218( was 10 mins after eating)   He is here for a 1 month follow up on diabetes. States his fasting BS have been 95-130.   Repots good compliance on Metformin and denies side effects.   States he feels well. No other concerns today. He is being treated with steroids for TTP and only has 2 doses left. States his hematologist is discontinuing this medication.   Denies fever, chills, dizziness, chest pain, palpitations, shortness of breath, abdominal pain, N/V/D, urinary symptoms.   Apparently he did not show up for nutrition appointment per record.     Review of Systems Pertinent positives and negatives in the history of present illness.     Objective:   Physical Exam BP 130/90   Pulse 89   Wt 206 lb (93.4 kg)   BMI 29.56 kg/m   Alert and oriented and in no acute distress. Not otherwise examined.       Assessment & Plan:  Uncontrolled type 2 diabetes mellitus with hyperglycemia (HCC)  His FBS has improved on increased dose of Metformin. He is finishing up with the steroid soon which is also help blood sugars. Plan to have him continue on Metformin and continue checking blood sugars.  Counseled on healthy diet and exercise.  Plan to bring him back in 2 months for Hgb A1c and fasting lipids.

## 2017-05-27 ENCOUNTER — Telehealth: Payer: Self-pay | Admitting: Family Medicine

## 2017-05-27 DIAGNOSIS — E1165 Type 2 diabetes mellitus with hyperglycemia: Secondary | ICD-10-CM

## 2017-05-27 MED ORDER — METFORMIN HCL 1000 MG PO TABS: 1000.0000 mg | ORAL_TABLET | Freq: Two times a day (BID) | ORAL | 2 refills | Status: DC

## 2017-05-27 NOTE — Telephone Encounter (Signed)
done

## 2017-05-27 NOTE — Telephone Encounter (Signed)
Pt requesting refills on Metformin. This has never been filled by [facility] but is listed in medications and per pt dose was change by her. Please send to [facility]. Pt can be reached at [phone]. PT IS COMPLETELY OUT SO NEEDS TO BE HANDLED ASAP.

## 2017-05-31 ENCOUNTER — Encounter: Payer: Self-pay | Admitting: Gastroenterology

## 2017-05-31 ENCOUNTER — Ambulatory Visit (INDEPENDENT_AMBULATORY_CARE_PROVIDER_SITE_OTHER): Payer: Commercial Managed Care - PPO | Admitting: Gastroenterology

## 2017-05-31 VITALS — BP 120/84 | HR 76 | Ht 69.19 in | Wt 201.0 lb

## 2017-05-31 DIAGNOSIS — F17200 Nicotine dependence, unspecified, uncomplicated: Secondary | ICD-10-CM

## 2017-05-31 DIAGNOSIS — R1013 Epigastric pain: Secondary | ICD-10-CM

## 2017-05-31 DIAGNOSIS — G8929 Other chronic pain: Secondary | ICD-10-CM

## 2017-05-31 DIAGNOSIS — E119 Type 2 diabetes mellitus without complications: Secondary | ICD-10-CM | POA: Diagnosis not present

## 2017-05-31 DIAGNOSIS — K219 Gastro-esophageal reflux disease without esophagitis: Secondary | ICD-10-CM

## 2017-05-31 NOTE — Progress Notes (Signed)
[facility] Consult Note:  History: Eric Hooper 05/31/2017  Referring physician: Girtha Rm, NP-C  Reason for consult/chief complaint: Abdominal Pain (epigastric/upper abd pain) and Heartburn   Subjective  HPI:  (15 minutes late for appointment today)  This is a 42 year old man referred for history of GERD.  He is in clinic with his wife today.  They report that for at least the last 2 years he has had a burning upper abdominal pain for which she was evaluated in [place].  He reports having had both an EGD and colonoscopy, he is uncertain of the findings, and cannot recall the name of the physician who did it or where was performed.  He describes frequent pyrosis and regurgitation with a burning epigastric pain that is brought him to the ED on at least one occasion.  He was taking ibuprofen frequently for this pain until he was told to stop that recently.  Years ago he was also having tendencies to postprandial diarrhea, which only occurs occasionally now he has severe upper abdominal pain.  Sometimes this lower sternal/epigastric burning pain like keep him up all night.  He previously got good relief from Nexium when cared for [place], but says his insurance would not cover that here.  He is taking 2 Protonix tablets in the morning because one does not seem to help, and sometimes he will take another in the evening.  He denies change in bowel habits or rectal bleeding.  Denies dysphagia or odynophagia, but some days nearly any food and even water might cause him to have heartburn.    He has a recent diagnosis of TTP, and finished a course of steroids for that within the last few weeks.  His diabetes has been under suboptimal control.  There was recently some concern about possible DVT, but ultrasound was negative, and the diagnosis was phlebitis.   ROS:  Review of Systems  Constitutional: Negative for appetite change and unexpected weight change.  HENT:  Negative for mouth sores and voice change.   Eyes: Negative for pain and redness.  Respiratory: Negative for cough and shortness of breath.   Cardiovascular: Negative for chest pain and palpitations.  Genitourinary: Negative for dysuria and hematuria.  Musculoskeletal: Negative for arthralgias and myalgias.  Skin: Negative for pallor and rash.  Neurological: Negative for weakness and headaches.  Hematological: Negative for adenopathy.     Past Medical History: Past Medical History:  Diagnosis Date  . Diabetes mellitus without complication (HCC)    non-insulin dependent  . Diverticulosis 02/01/2017   on CT scan abd/pelvis  . GERD (gastroesophageal reflux disease)   . Hypertension   . Kidney stones 02/03/2017  . Smoker 04/13/2017     Past Surgical History: Past Surgical History:  Procedure Laterality Date  . ELBOW SURGERY Right   . IR FLUORO GUIDE CV LINE RIGHT  02/02/2017  . IR US GUIDE VASC ACCESS RIGHT  02/02/2017     Family History: Family History  Problem Relation Age of Onset  . Hypertension Mother   . Hyperlipidemia Mother   . Hypertension Father   . Colon cancer Maternal Aunt     Social History: Social History   Socioeconomic History  . Marital status: Single    Spouse name: None  . Number of children: 2  . Years of education: None  . Highest education level: None  Social Needs  . Financial resource strain: None  . Food insecurity - worry: None  . Food insecurity -  inability: None  . Transportation needs - medical: None  . Transportation needs - non-medical: None  Occupational History  . Occupation: Glass blower/designer  Tobacco Use  . Smoking status: Current Every Day Smoker    Packs/day: 0.50    Years: 15.00    Pack years: 7.50    Types: Cigarettes  . Smokeless tobacco: Never Used  Substance and Sexual Activity  . Alcohol use: Yes    Comment: 3-4 beers per day most days  . Drug use: No  . Sexual activity: None  Other Topics Concern  . None    Social History Narrative  . None    Allergies: No Known Allergies  Outpatient Meds: Current Outpatient Medications  Medication Sig Dispense Refill  . amLODipine (NORVASC) 5 MG tablet Take 1 tablet (5 mg total) by mouth daily. 30 tablet 0  . blood glucose meter kit and supplies KIT Dispense based on patient and insurance preference. Use up to four times daily as directed. (FOR ICD-9 250.00, 250.01). 1 each 0  . metFORMIN (GLUCOPHAGE) 1000 MG tablet Take 1 tablet (1,000 mg total) by mouth 2 (two) times daily with a meal. 60 tablet 2  . Multiple Vitamin (MULTIVITAMIN WITH MINERALS) TABS tablet Take 1 tablet by mouth daily. 30 tablet 0  . pantoprazole (PROTONIX) 40 MG tablet Take 1 tablet (40 mg total) daily by mouth. 30 tablet 1  . RA VITAMIN B-1 100 MG TABS take 1 tablet by mouth once daily 30 tablet 1   No current facility-administered medications for this visit.       ___________________________________________________________________ Objective   Exam:  BP 120/84 (BP Location: Left Arm, Patient Position: Sitting, Cuff Size: Normal)   Pulse 76   Ht 5' 9.19" (1.757 m) Comment: height measured without shoes  Wt 201 lb (91.2 kg)   BMI 29.52 kg/m    General: this is a(n) well-appearing man  Eyes: sclera anicteric, no redness  ENT: oral mucosa moist without lesions, no cervical or supraclavicular lymphadenopathy, good dentition  CV: RRR without murmur, S1/S2, no JVD, no peripheral edema  Resp: clear to auscultation bilaterally, normal RR and effort noted  GI: soft, mild epigastric tenderness, with active bowel sounds. No guarding or palpable organomegaly noted.  Skin; warm and dry, no rash or jaundice noted  Neuro: awake, alert and oriented x 3. Normal gross motor function and fluent speech  Labs:  Hemoglobin A1c 9.6 in December 2018  CMP Latest Ref Rng & Units 05/11/2017 03/23/2017 03/09/2017  Glucose 70 - 140 mg/dL 140 193(H) 574(HH)  BUN 7 - 26 mg/dL 13 22.5  17.7  Creatinine 0.70 - 1.30 mg/dL 1.00 1.1 1.5(H)  Sodium 136 - 145 mmol/L 141 141 138  Potassium 3.5 - 5.1 mmol/L 3.8 4.0 4.5  Chloride 98 - 109 mmol/L 105 - -  CO2 22 - 29 mmol/L '27 24 22  ' Calcium 8.4 - 10.4 mg/dL 10.3 10.1 9.5  Total Protein 6.4 - 8.3 g/dL 7.3 7.0 6.7  Total Bilirubin 0.2 - 1.2 mg/dL 0.6 0.41 0.41  Alkaline Phos 40 - 150 U/L 74 69 88  AST 5 - 34 U/L '16 14 11  ' ALT 0 - 55 U/L 35 35 30   CBC Latest Ref Rng & Units 05/11/2017 04/01/2017 03/23/2017  WBC 4.0 - 10.3 K/uL 12.8(H) 11.1(H) 13.6(H)  Hemoglobin 13.0 - 17.1 g/dL 16.0 15.6 15.4  Hematocrit 38.4 - 49.9 % 47.0 45.0 44.9  Platelets 140 - 400 K/uL 185 169 183   Platelets 106,000 on  February 25, 2017  Radiologic Studies:  CTAP with IV contrast only 02/01/17 (images personally reviewed):  Diffuse gastric wall thickening (not distended - no oral contrast)  Assessment: Encounter Diagnoses  Name Primary?  . Abdominal pain, chronic, epigastric Yes  . Gastroesophageal reflux disease, esophagitis presence not specified   . Smoking   . Type 2 diabetes mellitus not at goal [facility])     Years of chronic epigastric/lower chest pain that sounds digestive.  He has some symptoms of reflux, but there is a dyspeptic component as well.  He was taking NSAIDs frequently for this pain, which I have told him to avoid.  It is not clear why he has been taking such high-dose acid suppression.  I wonder if there may be some component of visceral hypersensitivity given his description of food and water triggers.  His previous workup is unknown, and it does not seem more likely to get any records since he cannot recall the name of his physician with a location where the endoscopic procedures were performed. I discussed usual triggers of dyspepsia and reflux including dietary triggers, alcohol and smoking.  He seems surprised to learn that smoking might aggravate this problem, and I strongly counseled him to quit.  His poorly controlled diabetes  is also likely contributing as well, as it may be causing delayed gastric emptying.  The significance of the CT scan findings regarding the stomach are uncertain as it is a noncontrast study.,   Plan:  Written educational materials on GERD were given to him. Upper endoscopy, he is agreeable after a thorough discussion of procedure and risks.  The benefits and risks of the planned procedure were described in detail with the patient or (when appropriate) their health care proxy.  Risks were outlined as including, but not limited to, bleeding, infection, perforation, adverse medication reaction leading to cardiac or pulmonary decompensation, or pancreatitis (if ERCP).  The limitation of incomplete mucosal visualization was also discussed.  No guarantees or warranties were given.  Thank you for the courtesy of this consult.  Please call me with any questions or concerns.  Nelida Meuse III  CC: Girtha Rm, NP-C

## 2017-05-31 NOTE — Patient Instructions (Addendum)
If you are age 44 or older, your body mass index should be between 23-30. Your Body mass index is 29.52 kg/m. If this is out of the aforementioned range listed, please consider follow up with your Primary Care Provider.  If you are age 44 or younger, your body mass index should be between 19-25. Your Body mass index is 29.52 kg/m. If this is out of the aformentioned range listed, please consider follow up with your Primary Care Provider.   You have been scheduled for an endoscopy. Please follow written instructions given to you at your visit today. If you use inhalers (even only as needed), please bring them with you on the day of your procedure. Your physician has requested that you go to www.startemmi.com and enter the access code given to you at your visit today. This web site gives a general overview about your procedure. However, you should still follow specific instructions given to you by our office regarding your preparation for the procedure.   Gastroesophageal Reflux Disease, Adult Normally, food travels down the esophagus and stays in the stomach to be digested. If a person has gastroesophageal reflux disease (GERD), food and stomach acid move back up into the esophagus. When this happens, the esophagus becomes sore and swollen (inflamed). Over time, GERD can make small holes (ulcers) in the lining of the esophagus. Follow these instructions at home: Diet  Follow a diet as told by your doctor. You may need to avoid foods and drinks such as: ? Coffee and tea (with or without caffeine). ? Drinks that contain alcohol. ? Energy drinks and sports drinks. ? Carbonated drinks or sodas. ? Chocolate and cocoa. ? Peppermint and mint flavorings. ? Garlic and onions. ? Horseradish. ? Spicy and acidic foods, such as peppers, chili powder, curry powder, vinegar, hot sauces, and BBQ sauce. ? Citrus fruit juices and citrus fruits, such as oranges, lemons, and limes. ? Tomato-based foods, such  as red sauce, chili, salsa, and pizza with red sauce. ? Fried and fatty foods, such as donuts, french fries, potato chips, and high-fat dressings. ? High-fat meats, such as hot dogs, rib eye steak, sausage, ham, and bacon. ? High-fat dairy items, such as whole milk, butter, and cream cheese.  Eat small meals often. Avoid eating large meals.  Avoid drinking large amounts of liquid with your meals.  Avoid eating meals during the 2-3 hours before bedtime.  Avoid lying down right after you eat.  Do not exercise right after you eat. General instructions  Pay attention to any changes in your symptoms.  Take over-the-counter and prescription medicines only as told by your doctor. Do not take aspirin, ibuprofen, or other NSAIDs unless your doctor says it is okay.  Do not use any tobacco products, including cigarettes, chewing tobacco, and e-cigarettes. If you need help quitting, ask your doctor.  Wear loose clothes. Do not wear anything tight around your waist.  Raise (elevate) the head of your bed about 6 inches (15 cm).  Try to lower your stress. If you need help doing this, ask your doctor.  If you are overweight, lose an amount of weight that is healthy for you. Ask your doctor about a safe weight loss goal.  Keep all follow-up visits as told by your doctor. This is important. Contact a doctor if:  You have new symptoms.  You lose weight and you do not know why it is happening.  You have trouble swallowing, or it hurts to swallow.  You have wheezing  or a cough that keeps happening.  Your symptoms do not get better with treatment.  You have a hoarse voice. Get help right away if:  You have pain in your arms, neck, jaw, teeth, or back.  You feel sweaty, dizzy, or light-headed.  You have chest pain or shortness of breath.  You throw up (vomit) and your throw up looks like blood or coffee grounds.  You pass out (faint).  Your poop (stool) is bloody or black.  You  cannot swallow, drink, or eat. This information is not intended to replace advice given to you by your health care provider. Make sure you discuss any questions you have with your health care provider. Document Released: 09/30/2007 Document Revised: 09/19/2015 Document Reviewed: 08/08/2014 Elsevier Interactive Patient Education  2018 [place] for Gastroesophageal Reflux Disease, Adult When you have gastroesophageal reflux disease (GERD), the foods you eat and your eating habits are very important. Choosing the right foods can help ease your discomfort. What guidelines do I need to follow?  Choose fruits, vegetables, whole grains, and low-fat dairy products.  Choose low-fat meat, fish, and poultry.  Limit fats such as oils, salad dressings, butter, nuts, and avocado.  Keep a food diary. This helps you identify foods that cause symptoms.  Avoid foods that cause symptoms. These may be different for everyone.  Eat small meals often instead of 3 large meals a day.  Eat your meals slowly, in a place where you are relaxed.  Limit fried foods.  Cook foods using methods other than frying.  Avoid drinking alcohol.  Avoid drinking large amounts of liquids with your meals.  Avoid bending over or lying down until 2-3 hours after eating. What foods are not recommended? These are some foods and drinks that may make your symptoms worse: Vegetables Tomatoes. Tomato juice. Tomato and spaghetti sauce. Chili peppers. Onion and garlic. Horseradish. Fruits Oranges, grapefruit, and lemon (fruit and juice). Meats High-fat meats, fish, and poultry. This includes hot dogs, ribs, ham, sausage, salami, and bacon. Dairy Whole milk and chocolate milk. Sour cream. Cream. Butter. Ice cream. Cream cheese. Drinks Coffee and tea. Bubbly (carbonated) drinks or energy drinks. Condiments Hot sauce. Barbecue sauce. Sweets/Desserts Chocolate and cocoa. Donuts. Peppermint and  spearmint. Fats and Oils High-fat foods. This includes [place] fries and potato chips. Other Vinegar. Strong spices. This includes black pepper, white pepper, red pepper, cayenne, curry powder, cloves, ginger, and chili powder. The items listed above may not be a complete list of foods and drinks to avoid. Contact your dietitian for more information. This information is not intended to replace advice given to you by your health care provider. Make sure you discuss any questions you have with your health care provider. Document Released: 10/13/2011 Document Revised: 09/19/2015 Document Reviewed: 02/15/2013 Elsevier Interactive Patient Education  2017 [place].   Thank you for choosing [facility]  Dr Wilfrid Lund III

## 2017-06-01 ENCOUNTER — Encounter: Payer: Self-pay | Admitting: Gastroenterology

## 2017-06-01 ENCOUNTER — Ambulatory Visit (AMBULATORY_SURGERY_CENTER): Payer: Commercial Managed Care - PPO | Admitting: Gastroenterology

## 2017-06-01 ENCOUNTER — Other Ambulatory Visit: Payer: Self-pay

## 2017-06-01 VITALS — BP 135/81 | HR 71 | Temp 98.6°F | Resp 11 | Ht 69.0 in | Wt 201.0 lb

## 2017-06-01 DIAGNOSIS — R1013 Epigastric pain: Secondary | ICD-10-CM | POA: Diagnosis present

## 2017-06-01 DIAGNOSIS — K219 Gastro-esophageal reflux disease without esophagitis: Secondary | ICD-10-CM

## 2017-06-01 DIAGNOSIS — K209 Esophagitis, unspecified: Secondary | ICD-10-CM | POA: Diagnosis not present

## 2017-06-01 DIAGNOSIS — K2950 Unspecified chronic gastritis without bleeding: Secondary | ICD-10-CM | POA: Diagnosis not present

## 2017-06-01 MED ORDER — SODIUM CHLORIDE 0.9 % IV SOLN: 500.0000 mL | Freq: Once | INTRAVENOUS | Status: DC

## 2017-06-01 NOTE — Progress Notes (Signed)
Spontaneous respirations throughout. VSS. Resting comfortably. To PACU on room air. Report to  RN. 

## 2017-06-01 NOTE — Progress Notes (Signed)
Called to room to assist during endoscopic procedure.  Patient ID and intended procedure confirmed with present staff. Received instructions for my participation in the procedure from the performing physician.  

## 2017-06-01 NOTE — Patient Instructions (Addendum)
YOU HAD AN ENDOSCOPIC PROCEDURE TODAY AT [facility]:   Refer to the procedure report that was given to you for any specific questions about what was found during the examination.  If the procedure report does not answer your questions, please call your gastroenterologist to clarify.  If you requested that your care partner not be given the details of your procedure findings, then the procedure report has been included in a sealed envelope for you to review at your convenience later.  YOU SHOULD EXPECT: Some feelings of bloating in the abdomen. Passage of more gas than usual.  Walking can help get rid of the air that was put into your GI tract during the procedure and reduce the bloating. If you had a lower endoscopy (such as a colonoscopy or flexible sigmoidoscopy) you may notice spotting of blood in your stool or on the toilet paper. If you underwent a bowel prep for your procedure, you may not have a normal bowel movement for a few days.  Please Note:  You might notice some irritation and congestion in your nose or some drainage.  This is from the oxygen used during your procedure.  There is no need for concern and it should clear up in a day or so.  SYMPTOMS TO REPORT IMMEDIATELY:   Following upper endoscopy (EGD)  Vomiting of blood or coffee ground material  New chest pain or pain under the shoulder blades  Painful or persistently difficult swallowing  New shortness of breath  Fever of 100F or higher  Black, tarry-looking stools  For urgent or emergent issues, a gastroenterologist can be reached at any hour by calling [phone].   DIET:  We do recommend a small meal at first, but then you may proceed to your regular diet.  Drink plenty of fluids but you should avoid alcoholic beverages for 24 hours.  ACTIVITY:  You should plan to take it easy for the rest of today and you should NOT DRIVE or use heavy machinery until tomorrow (because of the sedation medicines used  during the test).    FOLLOW UP: Our staff will call the number listed on your records the next business day following your procedure to check on you and address any questions or concerns that you may have regarding the information given to you following your procedure. If we do not reach you, we will leave a message.  However, if you are feeling well and you are not experiencing any problems, there is no need to return our call.  We will assume that you have returned to your regular daily activities without incident.  If any biopsies were taken you will be contacted by phone or by letter within the next 1-3 weeks.  Please call us at [phone] if you have not heard about the biopsies in 3 weeks.   Discontinue the use of any products containing nicotine. Stop smoking. Achieve better blood sugar control Await for biopsy results Follow an antireflux regimen indefinitely Follow up with office visit schedule in March   SIGNATURES/CONFIDENTIALITY: You and/or your care partner have signed paperwork which will be entered into your electronic medical record.  These signatures attest to the fact that that the information above on your After Visit Summary has been reviewed and is understood.  Full responsibility of the confidentiality of this discharge information lies with you and/or your care-partner.

## 2017-06-01 NOTE — Op Note (Signed)
[facility] Patient Name: Eric Hooper Procedure Date: 06/01/2017 10:59 AM MRN: [id number] Endoscopist: Mallie Mussel L. Loletha Carrow , MD Age: 42 Referring MD:  Date of Birth: 09-Sep-1975 Gender: Male Account #: [id number] Procedure:                Upper GI endoscopy Indications:              Epigastric abdominal pain, Heartburn, Esophageal                            reflux symptoms that persist despite appropriate                            therapy Medicines:                Monitored Anesthesia Care Procedure:                Pre-Anesthesia Assessment:                           - Prior to the procedure, a History and Physical                            was performed, and patient medications and                            allergies were reviewed. The patient's tolerance of                            previous anesthesia was also reviewed. The risks                            and benefits of the procedure and the sedation                            options and risks were discussed with the patient.                            All questions were answered, and informed consent                            was obtained. Prior Anticoagulants: The patient has                            taken no previous anticoagulant or antiplatelet                            agents. ASA Grade Assessment: II - A patient with                            mild systemic disease. After reviewing the risks                            and benefits, the patient was deemed in  satisfactory condition to undergo the procedure.                           After obtaining informed consent, the endoscope was                            passed under direct vision. Throughout the                            procedure, the patient's blood pressure, pulse, and                            oxygen saturations were monitored continuously. The                            Endoscope was introduced through the mouth, and                           advanced to the second part of duodenum. The upper                            GI endoscopy was accomplished without difficulty.                            The patient tolerated the procedure well. Scope In: Scope Out: Findings:                 The larynx was normal.                           The examined esophagus was normal. Multiple                            biopsies were obtained in the middle third of the                            esophagus and in the lower third of the esophagus                            with cold forceps for histology.                           The entire examined stomach was normal. Biopsies                            were taken with a cold forceps for histology.                            (Sidney protocol).                           The cardia and gastric fundus were normal on                            retroflexion.  The examined duodenum was normal. Complications:            No immediate complications. Estimated Blood Loss:     Estimated blood loss was minimal. Impression:               - Normal larynx.                           - Normal esophagus.                           - Normal stomach. Biopsied.                           - Normal examined duodenum.                           - Multiple biopsies were obtained in the middle                            third of the esophagus and in the lower third of                            the esophagus. Recommendation:           - Patient has a contact number available for                            emergencies. The signs and symptoms of potential                            delayed complications were discussed with the                            patient. Return to normal activities tomorrow.                            Written discharge instructions were provided to the                            patient.                           - Resume previous diet.                            - Continue present medications.                           - Await pathology results.                           - Return to my office at appointment to be                            scheduled.                           -  Follow an antireflux regimen indefinitely.                           - Discontinue the use of any products containing                            nicotine. Stop smoking.                           - Achieve better blood sugar control. Henry L. Loletha Carrow, MD 06/01/2017 11:15:42 AM This report has been signed electronically.

## 2017-06-01 NOTE — Progress Notes (Signed)
Pt's states no medical or surgical changes since previsit or office visit. 

## 2017-06-02 ENCOUNTER — Telehealth: Payer: Self-pay | Admitting: *Deleted

## 2017-06-02 NOTE — Telephone Encounter (Signed)
Message left

## 2017-06-07 ENCOUNTER — Other Ambulatory Visit: Payer: Self-pay | Admitting: Family Medicine

## 2017-06-07 ENCOUNTER — Encounter: Payer: Self-pay | Admitting: Gastroenterology

## 2017-06-25 ENCOUNTER — Telehealth: Payer: Self-pay

## 2017-06-25 ENCOUNTER — Other Ambulatory Visit: Payer: Self-pay

## 2017-06-25 NOTE — Telephone Encounter (Signed)
Called about med increase. Pt was last know to take one tablet of 5 mg. Pt was informed to call our office if cardio made the change. Thanks [employer]

## 2017-06-28 ENCOUNTER — Telehealth: Payer: Self-pay | Admitting: Internal Medicine

## 2017-06-28 DIAGNOSIS — M3119 Other thrombotic microangiopathy: Secondary | ICD-10-CM

## 2017-06-28 DIAGNOSIS — M311 Thrombotic microangiopathy: Secondary | ICD-10-CM

## 2017-06-28 MED ORDER — AMLODIPINE BESYLATE 10 MG PO TABS: 10.0000 mg | ORAL_TABLET | Freq: Every day | ORAL | 1 refills | Status: DC

## 2017-06-28 MED ORDER — PANTOPRAZOLE SODIUM 40 MG PO TBEC: 40.0000 mg | DELAYED_RELEASE_TABLET | Freq: Every day | ORAL | 1 refills | Status: DC

## 2017-06-28 NOTE — Telephone Encounter (Signed)
Pt's girlfriend called and states that the amlodipine was suppose to be 5mg  twice a day but has since ran out as it was sent in for 5mg  daily. I have discontinued that med and put in amlodipine 10mg  for once daily and sent to pharmacy. Pt's gf was advised to follow-up within the month to make sure bp is where it needs to be. Pt will call back to scheduled.  ok'ed changed from vickie

## 2017-07-05 ENCOUNTER — Encounter: Payer: Self-pay | Admitting: Gastroenterology

## 2017-07-05 ENCOUNTER — Ambulatory Visit (INDEPENDENT_AMBULATORY_CARE_PROVIDER_SITE_OTHER): Payer: Commercial Managed Care - PPO | Admitting: Gastroenterology

## 2017-07-05 VITALS — BP 132/86 | HR 72 | Ht 70.0 in | Wt 207.4 lb

## 2017-07-05 DIAGNOSIS — K219 Gastro-esophageal reflux disease without esophagitis: Secondary | ICD-10-CM | POA: Diagnosis not present

## 2017-07-05 MED ORDER — ESOMEPRAZOLE MAGNESIUM 40 MG PO PACK: 40.0000 mg | PACK | Freq: Every day | ORAL | 3 refills | Status: DC

## 2017-07-05 MED ORDER — SUCRALFATE 1 GM/10ML PO SUSP
1.0000 g | Freq: Four times a day (QID) | ORAL | 1 refills | Status: DC
Start: 2017-07-05 — End: 2018-02-25

## 2017-07-05 MED ORDER — ESOMEPRAZOLE MAGNESIUM 40 MG PO CPDR: 40.0000 mg | DELAYED_RELEASE_CAPSULE | Freq: Every day | ORAL | 3 refills | Status: DC

## 2017-07-05 NOTE — Progress Notes (Signed)
[facility] Progress Note  Chief Complaint: Epigastric pain and GERD  Subjective  History:  This is a 42 year old man I saw in clinic about a month ago with chronic epigastric pain and GERD.  I felt that NSAID use, smoking and poorly controlled diabetes were contributing. EGD 06/01/2017 normal.  Gastric biopsies negative for H. pylori, esophageal biopsies consistent with reflux, no eosinophilic esophagitis.  Eric Hooper reports feeling the same as before.  He has severe frequent heartburn that bothers him day and night, even just drinking water.  He says that no medicine other than Nexium is ever helped him, but he cannot afford to buy it over-the-counter if his insurance will not cover it.  He recently bought a small supply of it and it made his heartburn better where the Protonix did not seem to help at all.  He had also previously tried Zantac without improvement.  He is still smoking, though "less than before".  He feels that he is hemoglobin A1c of 9.6 in December but was because he had been given steroids for his ITP.  ROS: Cardiovascular:  no chest pain Respiratory: no dyspnea  The patient's Past Medical, Family and Social History were reviewed and are on file in the EMR.  Objective:  Med list reviewed  Current Outpatient Medications:  .  amLODipine (NORVASC) 10 MG tablet, Take 1 tablet (10 mg total) by mouth daily., Disp: 30 tablet, Rfl: 1 .  blood glucose meter kit and supplies KIT, Dispense based on patient and insurance preference. Use up to four times daily as directed. (FOR ICD-9 250.00, 250.01)., Disp: 1 each, Rfl: 0 .  esomeprazole (NEXIUM) 20 MG capsule, Take 20 mg by mouth daily at 12 noon., Disp: , Rfl:  .  metFORMIN (GLUCOPHAGE) 1000 MG tablet, Take 1 tablet (1,000 mg total) by mouth 2 (two) times daily with a meal., Disp: 60 tablet, Rfl: 2 .  Multiple Vitamin (MULTIVITAMIN WITH MINERALS) TABS tablet, Take 1 tablet by mouth daily., Disp: 30 tablet, Rfl: 0 .  RA  VITAMIN B-1 100 MG TABS, take 1 tablet by mouth once daily, Disp: 30 tablet, Rfl: 1 .  esomeprazole (NEXIUM) 40 MG capsule, Take 1 capsule (40 mg total) by mouth daily at 12 noon., Disp: 30 capsule, Rfl: 3 .  sucralfate (CARAFATE) 1 GM/10ML suspension, Take 10 mLs (1 g total) by mouth 4 (four) times daily., Disp: 420 mL, Rfl: 1  Current Facility-Administered Medications:  .  0.9 %  sodium chloride infusion, 500 mL, Intravenous, Once, Danis, Estill Cotta III, MD   Vital signs in last 24 hrs: Vitals:   07/05/17 1606  BP: 132/86  Pulse: 72    Physical Exam  He is here with his wife today.  He has normal vocal quality, no acute distress  HEENT: sclera anicteric, oral mucosa moist without lesions  Neck: supple, no thyromegaly, JVD or lymphadenopathy  Cardiac: RRR without murmurs, S1S2 heard, no peripheral edema  Pulm: clear to auscultation bilaterally, normal RR and effort noted  Abdomen: soft, no tenderness, with active bowel sounds. No guarding or palpable hepatosplenomegaly.  Skin; warm and dry, no jaundice or rash  EGD and biopsy as noted above   '@ASSESSMENTPLANBEGIN' @ Assessment: Encounter Diagnosis  Name Primary?  . Gastroesophageal reflux disease without esophagitis Yes   I had a long discussion with him and his wife about his condition.  He has had over a decade of frequent heartburn unresponsive to any acid suppression with Nexium.  I suspect there  is a significant element of visceral hypersensitivity since he gets heartburn with just water.  I also think the smoking is exacerbating this, though he does not seem to agree with me on either  of those assessments.  He does not seem especially reassured by his recent normal endoscopy findings.   Plan: I prescribed Nexium 40 mg once daily, but I do not know if his insurance will cover it.  If not, then I recommended he buy it over-the-counter and take 2 capsules once daily.Marland Kitchen  He says he cannot afford to do so (despite continued  cigarette use), but I think he would have to consider some weight and so. I have given him a trial of Carafate 10 cc 3-4 times daily.  He feels like he may have tried this in the past while living in [place], but cannot recall if it was helpful.  He will give it a try again.  I will see him back as needed. Total time 20 minutes, over half spent in counseling and coordination of care.   Eric Hooper

## 2017-07-05 NOTE — Patient Instructions (Signed)
If you are age 44 or older, your body mass index should be between 23-30. Your Body mass index is 29.76 kg/m. If this is out of the aforementioned range listed, please consider follow up with your Primary Care Provider.  If you are age 39 or younger, your body mass index should be between 19-25. Your Body mass index is 29.76 kg/m. If this is out of the aformentioned range listed, please consider follow up with your Primary Care Provider.   Follow up as needed.   Thank you for choosing [facility]  Dr Wilfrid Lund III

## 2017-07-09 ENCOUNTER — Inpatient Hospital Stay: Payer: Commercial Managed Care - PPO

## 2017-07-20 ENCOUNTER — Other Ambulatory Visit: Payer: Self-pay | Admitting: Emergency Medicine

## 2017-07-21 ENCOUNTER — Ambulatory Visit (INDEPENDENT_AMBULATORY_CARE_PROVIDER_SITE_OTHER): Payer: Commercial Managed Care - PPO | Admitting: Family Medicine

## 2017-07-21 ENCOUNTER — Encounter: Payer: Self-pay | Admitting: Family Medicine

## 2017-07-21 VITALS — BP 132/88 | HR 84 | Wt 202.0 lb

## 2017-07-21 DIAGNOSIS — E1165 Type 2 diabetes mellitus with hyperglycemia: Secondary | ICD-10-CM | POA: Diagnosis not present

## 2017-07-21 DIAGNOSIS — F17200 Nicotine dependence, unspecified, uncomplicated: Secondary | ICD-10-CM

## 2017-07-21 DIAGNOSIS — I10 Essential (primary) hypertension: Secondary | ICD-10-CM | POA: Diagnosis not present

## 2017-07-21 DIAGNOSIS — Z13220 Encounter for screening for lipoid disorders: Secondary | ICD-10-CM

## 2017-07-21 LAB — POCT GLYCOSYLATED HEMOGLOBIN (HGB A1C): Hemoglobin A1C: 5.7

## 2017-07-21 MED ORDER — METFORMIN HCL 1000 MG PO TABS: 1000.0000 mg | ORAL_TABLET | Freq: Every day | ORAL | 1 refills | Status: DC

## 2017-07-21 NOTE — Progress Notes (Signed)
   Subjective:    [name], male    DOB: 1975-11-27, 42 y.o.   MRN: [id number]  HPI Chief Complaint  Patient presents with  . bp issues    bp issues- headache   He is here with concerns about his BP being elevated for the past few days. States he would also like to follow up on diabetes.   Checks his BP at home and brought in his BP cuff to compare. His cuff is close to our reading.  Reports good medication compliance on amlodipine and diabetes medication.  States his diet has improved, he has cut back on smoking and alcohol use.  States he did have an elevated BP the other day when he had a headache.   States he has tried multiple medications for HTN in the past including lisinopril, HCTZ and noen of these were as effective as amlodipine.   Diabetes - Hgb A1c in December as 9.6%. This was new for him and he was taking oral steroids for TTP. He is no longer taking oral steroids.  Checks fasting blood sugars at home daily and lowest blood sugar was 75-150.    Denies fever, chills, dizziness, vision changes, chest pain, palpitations, cough, shortness of breath, abdominal pain, N/V/D, urinary symptoms.   Reviewed allergies, medications, past medical, surgical, family, and social history.    Review of Systems Pertinent positives and negatives in the history of present illness.     Objective:   Physical Exam BP 132/88   Pulse 84   Wt 202 lb (91.6 kg)   BMI 28.98 kg/m   Alert and oriented and in no acute distress. Not otherwise examined.       Assessment & Plan:  Essential hypertension  Smoker  Uncontrolled type 2 diabetes mellitus with hyperglycemia ([place]) - Plan: HgB A1c, Lipid panel, metFORMIN (GLUCOPHAGE) 1000 MG tablet  Screening for lipid disorders - Plan: Lipid panel  Discussed that his diabetes is now well controlled. Suspect stopping oral steroids is playing a role as well as improved diet, activity and increasing his Metformin to 1,000 mg bid.    A1c 5.7% Plan to cut Metformin dose back to once daily in the morning.  BP is close to goal. Will have him continue on current medication and keep checking this at home.  DASH diet discussed.  He has cut back on smoking and alcohol. Encouraged him to continue cutting back and to stop.  He had a banana this morning, no lipid panel on file. Will check lipids. He does not want to start on a statin. I counseled him on the need for statin due to diabetes. We will revisit this at his next appointment.  Follow up in 4 months or sooner if BP not well controlled. Will recheck microalbumin at next visit.

## 2017-07-21 NOTE — Patient Instructions (Addendum)
Continue on your current medications.   Your hemoglobin A1c is 5.7% today and in normal range.  Continue checking your blood sugars at home in the morning (at least 3-4 days a week).  Take Metformin 1,000 mg in the morning with breakfast only.  Follow up in 4 months for diabetes.   Continue to keep an eye on your blood pressure at home and let me know if your readings are not consistently less than 130/80.   Continue to cut back on smoking and alcohol use. The goal is to stop smoking.   Eat a low salt diet.  Increase your physical activity.     DASH Eating Plan DASH stands for "Dietary Approaches to Stop Hypertension." The DASH eating plan is a healthy eating plan that has been shown to reduce high blood pressure (hypertension). It may also reduce your risk for type 2 diabetes, heart disease, and stroke. The DASH eating plan may also help with weight loss. What are tips for following this plan? General guidelines  Avoid eating more than 2,300 mg (milligrams) of salt (sodium) a day. If you have hypertension, you may need to reduce your sodium intake to 1,500 mg a day.  Limit alcohol intake to no more than 1 drink a day for nonpregnant women and 2 drinks a day for men. One drink equals 12 oz of beer, 5 oz of wine, or 1 oz of hard liquor.  Work with your health care provider to maintain a healthy body weight or to lose weight. Ask what an ideal weight is for you.  Get at least 30 minutes of exercise that causes your heart to beat faster (aerobic exercise) most days of the week. Activities may include walking, swimming, or biking.  Work with your health care provider or diet and nutrition specialist (dietitian) to adjust your eating plan to your individual calorie needs. Reading food labels  Check food labels for the amount of sodium per serving. Choose foods with less than 5 percent of the Daily Value of sodium. Generally, foods with less than 300 mg of sodium per serving fit into this  eating plan.  To find whole grains, look for the word "whole" as the first word in the ingredient list. Shopping  Buy products labeled as "low-sodium" or "no salt added."  Buy fresh foods. Avoid canned foods and premade or frozen meals. Cooking  Avoid adding salt when cooking. Use salt-free seasonings or herbs instead of table salt or sea salt. Check with your health care provider or pharmacist before using salt substitutes.  Do not fry foods. Cook foods using healthy methods such as baking, boiling, grilling, and broiling instead.  Cook with heart-healthy oils, such as olive, canola, soybean, or sunflower oil. Meal planning   Eat a balanced diet that includes: ? 5 or more servings of fruits and vegetables each day. At each meal, try to fill half of your plate with fruits and vegetables. ? Up to 6-8 servings of whole grains each day. ? Less than 6 oz of lean meat, poultry, or fish each day. A 3-oz serving of meat is about the same size as a deck of cards. One egg equals 1 oz. ? 2 servings of low-fat dairy each day. ? A serving of nuts, seeds, or beans 5 times each week. ? Heart-healthy fats. Healthy fats called Omega-3 fatty acids are found in foods such as flaxseeds and coldwater fish, like sardines, salmon, and mackerel.  Limit how much you eat of the following: ?  Canned or prepackaged foods. ? Food that is high in trans fat, such as fried foods. ? Food that is high in saturated fat, such as fatty meat. ? Sweets, desserts, sugary drinks, and other foods with added sugar. ? Full-fat dairy products.  Do not salt foods before eating.  Try to eat at least 2 vegetarian meals each week.  Eat more home-cooked food and less restaurant, buffet, and fast food.  When eating at a restaurant, ask that your food be prepared with less salt or no salt, if possible. What foods are recommended? The items listed may not be a complete list. Talk with your dietitian about what dietary choices  are best for you. Grains Whole-grain or whole-wheat bread. Whole-grain or whole-wheat pasta. Brown rice. [place] Morrow. Bulgur. Whole-grain and low-sodium cereals. Pita bread. Low-fat, low-sodium crackers. Whole-wheat flour tortillas. Vegetables Fresh or frozen vegetables (raw, steamed, roasted, or grilled). Low-sodium or reduced-sodium tomato and vegetable juice. Low-sodium or reduced-sodium tomato sauce and tomato paste. Low-sodium or reduced-sodium canned vegetables. Fruits All fresh, dried, or frozen fruit. Canned fruit in natural juice (without added sugar). Meat and other protein foods Skinless chicken or [place]. Ground chicken or [place]. Pork with fat trimmed off. Fish and seafood. Egg whites. Dried beans, peas, or lentils. Unsalted nuts, nut butters, and seeds. Unsalted canned beans. Lean cuts of beef with fat trimmed off. Low-sodium, lean deli meat. Dairy Low-fat (1%) or fat-free (skim) milk. Fat-free, low-fat, or reduced-fat cheeses. Nonfat, low-sodium ricotta or cottage cheese. Low-fat or nonfat yogurt. Low-fat, low-sodium cheese. Fats and oils Soft margarine without trans fats. Vegetable oil. Low-fat, reduced-fat, or light mayonnaise and salad dressings (reduced-sodium). Canola, safflower, olive, soybean, and sunflower oils. Avocado. Seasoning and other foods Herbs. Spices. Seasoning mixes without salt. Unsalted popcorn and pretzels. Fat-free sweets. What foods are not recommended? The items listed may not be a complete list. Talk with your dietitian about what dietary choices are best for you. Grains Baked goods made with fat, such as croissants, muffins, or some breads. Dry pasta or rice meal packs. Vegetables Creamed or fried vegetables. Vegetables in a cheese sauce. Regular canned vegetables (not low-sodium or reduced-sodium). Regular canned tomato sauce and paste (not low-sodium or reduced-sodium). Regular tomato and vegetable juice (not low-sodium or reduced-sodium). Angie Fava.  Olives. Fruits Canned fruit in a light or heavy syrup. Fried fruit. Fruit in cream or butter sauce. Meat and other protein foods Fatty cuts of meat. Ribs. Fried meat. Berniece Salines. Sausage. Bologna and other processed lunch meats. Salami. Fatback. Hotdogs. Bratwurst. Salted nuts and seeds. Canned beans with added salt. Canned or smoked fish. Whole eggs or egg yolks. Chicken or [place] with skin. Dairy Whole or 2% milk, cream, and half-and-half. Whole or full-fat cream cheese. Whole-fat or sweetened yogurt. Full-fat cheese. Nondairy creamers. Whipped toppings. Processed cheese and cheese spreads. Fats and oils Butter. Stick margarine. Lard. Shortening. Ghee. Bacon fat. Tropical oils, such as coconut, palm kernel, or palm oil. Seasoning and other foods Salted popcorn and pretzels. Onion salt, garlic salt, seasoned salt, table salt, and sea salt. Worcestershire sauce. Tartar sauce. Barbecue sauce. Teriyaki sauce. Soy sauce, including reduced-sodium. Steak sauce. Canned and packaged gravies. Fish sauce. Oyster sauce. Cocktail sauce. Horseradish that you find on the shelf. Ketchup. Mustard. Meat flavorings and tenderizers. Bouillon cubes. Hot sauce and Tabasco sauce. Premade or packaged marinades. Premade or packaged taco seasonings. Relishes. Regular salad dressings. Where to find more information:  National Heart, Lung, and [place]: https://wilson-eaton.com/  American Heart Association: www.heart.org Summary  The DASH eating plan is a healthy eating plan that has been shown to reduce high blood pressure (hypertension). It may also reduce your risk for type 2 diabetes, heart disease, and stroke.  With the DASH eating plan, you should limit salt (sodium) intake to 2,300 mg a day. If you have hypertension, you may need to reduce your sodium intake to 1,500 mg a day.  When on the DASH eating plan, aim to eat more fresh fruits and vegetables, whole grains, lean proteins, low-fat dairy, and heart-healthy  fats.  Work with your health care provider or diet and nutrition specialist (dietitian) to adjust your eating plan to your individual calorie needs. This information is not intended to replace advice given to you by your health care provider. Make sure you discuss any questions you have with your health care provider. Document Released: 04/02/2011 Document Revised: 04/06/2016 Document Reviewed: 04/06/2016 Elsevier Interactive Patient Education  [employer].

## 2017-07-22 ENCOUNTER — Ambulatory Visit: Payer: Commercial Managed Care - PPO | Admitting: Family Medicine

## 2017-07-22 LAB — LIPID PANEL
CHOLESTEROL TOTAL: 193 mg/dL (ref 100–199)
Chol/HDL Ratio: 4.5 ratio (ref 0.0–5.0)
HDL: 43 mg/dL (ref >39)
LDL Calculated: 120 mg/dL — ABNORMAL HIGH (ref 0–99)
TRIGLYCERIDES: 149 mg/dL (ref 0–149)
VLDL CHOLESTEROL CAL: 30 mg/dL (ref 5–40)

## 2017-08-21 ENCOUNTER — Other Ambulatory Visit: Payer: Self-pay | Admitting: Family Medicine

## 2017-08-21 DIAGNOSIS — M311 Thrombotic microangiopathy: Secondary | ICD-10-CM

## 2017-08-21 DIAGNOSIS — M3119 Other thrombotic microangiopathy: Secondary | ICD-10-CM

## 2017-08-21 DIAGNOSIS — E1165 Type 2 diabetes mellitus with hyperglycemia: Secondary | ICD-10-CM

## 2017-08-22 ENCOUNTER — Other Ambulatory Visit: Payer: Self-pay | Admitting: Family Medicine

## 2017-08-23 NOTE — Telephone Encounter (Signed)
Dr. Loletha Carrow put pt on nexium and not pantopraozole. I will deny med and refill metofrmin to once a day

## 2017-09-07 ENCOUNTER — Inpatient Hospital Stay: Payer: Commercial Managed Care - PPO

## 2017-09-07 ENCOUNTER — Inpatient Hospital Stay: Payer: Commercial Managed Care - PPO | Attending: Oncology | Admitting: Oncology

## 2017-09-13 ENCOUNTER — Telehealth: Payer: Self-pay | Admitting: Oncology

## 2017-09-13 NOTE — Telephone Encounter (Signed)
R/s missed appt from 5/15 - pt fiance is aware of appt date and time.

## 2017-10-01 ENCOUNTER — Telehealth: Payer: Self-pay | Admitting: Oncology

## 2017-10-01 NOTE — Telephone Encounter (Signed)
Appointments rescheduled per 6/7 voicemail. LMVM for patient with new date/time

## 2017-10-05 ENCOUNTER — Inpatient Hospital Stay: Payer: Commercial Managed Care - PPO

## 2017-10-05 ENCOUNTER — Inpatient Hospital Stay: Payer: Commercial Managed Care - PPO | Admitting: Oncology

## 2017-10-18 ENCOUNTER — Ambulatory Visit: Payer: Commercial Managed Care - PPO | Admitting: Oncology

## 2017-10-18 ENCOUNTER — Other Ambulatory Visit: Payer: Commercial Managed Care - PPO

## 2017-11-30 ENCOUNTER — Inpatient Hospital Stay: Payer: Commercial Managed Care - PPO | Attending: Oncology | Admitting: Oncology

## 2017-11-30 ENCOUNTER — Inpatient Hospital Stay: Payer: Commercial Managed Care - PPO

## 2017-12-01 ENCOUNTER — Other Ambulatory Visit: Payer: Self-pay | Admitting: Gastroenterology

## 2017-12-03 ENCOUNTER — Ambulatory Visit: Payer: Commercial Managed Care - PPO | Admitting: Family Medicine

## 2017-12-06 ENCOUNTER — Encounter: Payer: Self-pay | Admitting: Family Medicine

## 2017-12-07 ENCOUNTER — Encounter: Payer: Self-pay | Admitting: Internal Medicine

## 2017-12-07 ENCOUNTER — Encounter: Payer: Self-pay | Admitting: Family Medicine

## 2017-12-08 ENCOUNTER — Encounter: Payer: Self-pay | Admitting: Family Medicine

## 2017-12-14 ENCOUNTER — Ambulatory Visit: Payer: Commercial Managed Care - PPO | Admitting: Family Medicine

## 2017-12-16 ENCOUNTER — Other Ambulatory Visit: Payer: Self-pay | Admitting: Family Medicine

## 2017-12-16 NOTE — Telephone Encounter (Signed)
Pt has an appt 8/29

## 2017-12-21 ENCOUNTER — Emergency Department (HOSPITAL_COMMUNITY): Payer: Commercial Managed Care - PPO

## 2017-12-21 ENCOUNTER — Emergency Department (HOSPITAL_COMMUNITY)
Admission: EM | Admit: 2017-12-21 | Discharge: 2017-12-21 | Disposition: A | Discharge status: Home / Self Care | Payer: Commercial Managed Care - PPO | Attending: Emergency Medicine | Admitting: Emergency Medicine

## 2017-12-21 ENCOUNTER — Other Ambulatory Visit: Payer: Self-pay

## 2017-12-21 ENCOUNTER — Encounter (HOSPITAL_COMMUNITY): Payer: Self-pay | Admitting: Emergency Medicine

## 2017-12-21 DIAGNOSIS — Z7984 Long term (current) use of oral hypoglycemic drugs: Secondary | ICD-10-CM | POA: Diagnosis not present

## 2017-12-21 DIAGNOSIS — E119 Type 2 diabetes mellitus without complications: Secondary | ICD-10-CM | POA: Diagnosis not present

## 2017-12-21 DIAGNOSIS — F17210 Nicotine dependence, cigarettes, uncomplicated: Secondary | ICD-10-CM | POA: Insufficient documentation

## 2017-12-21 DIAGNOSIS — I10 Essential (primary) hypertension: Secondary | ICD-10-CM | POA: Diagnosis not present

## 2017-12-21 DIAGNOSIS — K429 Umbilical hernia without obstruction or gangrene: Secondary | ICD-10-CM | POA: Diagnosis not present

## 2017-12-21 DIAGNOSIS — Z79899 Other long term (current) drug therapy: Secondary | ICD-10-CM | POA: Insufficient documentation

## 2017-12-21 DIAGNOSIS — R1011 Right upper quadrant pain: Secondary | ICD-10-CM | POA: Insufficient documentation

## 2017-12-21 DIAGNOSIS — J9811 Atelectasis: Secondary | ICD-10-CM | POA: Diagnosis not present

## 2017-12-21 DIAGNOSIS — R079 Chest pain, unspecified: Secondary | ICD-10-CM | POA: Diagnosis not present

## 2017-12-21 DIAGNOSIS — M546 Pain in thoracic spine: Secondary | ICD-10-CM | POA: Insufficient documentation

## 2017-12-21 LAB — COMPREHENSIVE METABOLIC PANEL
ALT: 50 U/L — ABNORMAL HIGH (ref 0–44)
ANION GAP: 12 (ref 5–15)
AST: 28 U/L (ref 15–41)
Albumin: 4.1 g/dL (ref 3.5–5.0)
Alkaline Phosphatase: 99 U/L (ref 38–126)
BUN: 13 mg/dL (ref 6–20)
CALCIUM: 9.4 mg/dL (ref 8.9–10.3)
CO2: 25 mmol/L (ref 22–32)
Chloride: 104 mmol/L (ref 98–111)
Creatinine, Ser: 1.13 mg/dL (ref 0.61–1.24)
GFR calc non Af Amer: >60 mL/min (ref >60)
Glucose, Bld: 123 mg/dL — ABNORMAL HIGH (ref 70–99)
Potassium: 3.1 mmol/L — ABNORMAL LOW (ref 3.5–5.1)
SODIUM: 141 mmol/L (ref 135–145)
Total Bilirubin: 1.1 mg/dL (ref 0.3–1.2)
Total Protein: 7.2 g/dL (ref 6.5–8.1)

## 2017-12-21 LAB — URINALYSIS, ROUTINE W REFLEX MICROSCOPIC
BILIRUBIN URINE: NEGATIVE
Glucose, UA: NEGATIVE mg/dL
Hgb urine dipstick: NEGATIVE
KETONES UR: NEGATIVE mg/dL
Leukocytes, UA: NEGATIVE
NITRITE: NEGATIVE
PROTEIN: NEGATIVE mg/dL
Specific Gravity, Urine: 1.016 (ref 1.005–1.030)
pH: 6 (ref 5.0–8.0)

## 2017-12-21 LAB — I-STAT TROPONIN, ED
TROPONIN I, POC: 0.01 ng/mL (ref 0.00–0.08)
TROPONIN I, POC: 0.02 ng/mL (ref 0.00–0.08)

## 2017-12-21 LAB — CBC
HCT: 45 % (ref 39.0–52.0)
Hemoglobin: 15.6 g/dL (ref 13.0–17.0)
MCH: 32.7 pg (ref 26.0–34.0)
MCHC: 34.7 g/dL (ref 30.0–36.0)
MCV: 94.3 fL (ref 78.0–100.0)
Platelets: 201 10*3/uL (ref 150–400)
RBC: 4.77 MIL/uL (ref 4.22–5.81)
RDW: 12.3 % (ref 11.5–15.5)
WBC: 11.8 10*3/uL — AB (ref 4.0–10.5)

## 2017-12-21 LAB — D-DIMER, QUANTITATIVE: D-Dimer, Quant: 0.31 ug/mL-FEU (ref 0.00–0.50)

## 2017-12-21 LAB — LIPASE, BLOOD: LIPASE: 64 U/L — AB (ref 11–51)

## 2017-12-21 MED ORDER — METHOCARBAMOL 500 MG PO TABS: 500.0000 mg | ORAL_TABLET | Freq: Three times a day (TID) | ORAL | 0 refills | Status: DC | PRN

## 2017-12-21 MED ORDER — HYDROMORPHONE HCL 1 MG/ML IJ SOLN
1.0000 mg | Freq: Once | INTRAMUSCULAR | Status: AC
  Administered 2017-12-21: 1 mg via INTRAVENOUS
  Filled 2017-12-21: qty 1

## 2017-12-21 MED ORDER — DIPHENHYDRAMINE HCL 50 MG/ML IJ SOLN
25.0000 mg | Freq: Once | INTRAMUSCULAR | Status: AC
  Administered 2017-12-21: 25 mg via INTRAVENOUS
  Filled 2017-12-21: qty 1

## 2017-12-21 MED ORDER — FENTANYL CITRATE (PF) 100 MCG/2ML IJ SOLN
100.0000 ug | Freq: Once | INTRAMUSCULAR | Status: AC
  Administered 2017-12-21: 100 ug via INTRAVENOUS
  Filled 2017-12-21: qty 2

## 2017-12-21 MED ORDER — LACTATED RINGERS IV BOLUS
1000.0000 mL | Freq: Once | INTRAVENOUS | Status: AC
  Administered 2017-12-21: 1000 mL via INTRAVENOUS

## 2017-12-21 MED ORDER — MORPHINE SULFATE (PF) 4 MG/ML IV SOLN
8.0000 mg | Freq: Once | INTRAVENOUS | Status: DC
  Filled 2017-12-21: qty 2

## 2017-12-21 MED ORDER — DIPHENHYDRAMINE HCL 25 MG PO CAPS
50.0000 mg | ORAL_CAPSULE | Freq: Once | ORAL | Status: AC
  Administered 2017-12-21: 50 mg via ORAL
  Filled 2017-12-21: qty 2

## 2017-12-21 MED ORDER — POTASSIUM CHLORIDE CRYS ER 20 MEQ PO TBCR
40.0000 meq | EXTENDED_RELEASE_TABLET | Freq: Once | ORAL | Status: AC
  Administered 2017-12-21: 40 meq via ORAL
  Filled 2017-12-21: qty 2

## 2017-12-21 MED ORDER — LIDOCAINE 5 % EX PTCH: 1.0000 | MEDICATED_PATCH | CUTANEOUS | 0 refills | Status: DC

## 2017-12-21 NOTE — ED Provider Notes (Signed)
[facility] DEPARTMENT Provider Note   CSN: [id number] Arrival date & time: 12/21/17  1611     History   Chief Complaint Chief Complaint  Patient presents with  . Chest Pain  . Shortness of Breath  . Abdominal Pain    HPI Eric Hooper is a 42 y.o. male.  HPI  42 year old male presents with right-sided back pain.  Started yesterday after he was lifting something heavy at work.  However it has progressively worsened it is severe.  He has not taken anything for it.  He is unable to take NSAIDs due to prior TTP that was associated with this.  He has not taken Tylenol because he is also worried this might cause some future problem.  No vomiting.  The pain wraps around to the front of his abdomen is right upper quadrant.  It is pleuritic and worse with movements.  There is no chest pain or shortness of breath.  No fevers or urinary symptoms including no hematuria. Pain is constant.  No weakness to the lower extremities or incontinence.  No midline back pain.  No direct trauma.  Past Medical History:  Diagnosis Date  . Diabetes mellitus without complication (HCC)    non-insulin dependent  . Diverticulosis 02/01/2017   on CT scan abd/pelvis  . GERD (gastroesophageal reflux disease)   . Hypertension   . Kidney stones 02/03/2017  . Smoker 04/13/2017    Patient Active Problem List   Diagnosis Date Noted  . Phlebitis 05/12/2017  . Leg swelling 05/12/2017  . Smoker 04/13/2017  . GERD (gastroesophageal reflux disease)   . Hypertension   . TTP (thrombotic thrombocytopenic purpura) ([place]) 02/06/2017  . Cough 02/05/2017  . Alcohol use 02/03/2017  . Renal insufficiency 02/03/2017  . Leukocytosis 02/03/2017  . Thrombocytopenia ([place]) 02/01/2017  . Hypokalemia 02/01/2017  . Macrocytic anemia 02/01/2017  . Hyperglycemia 02/01/2017  . Microscopic hematuria 02/01/2017  . Diverticulosis 02/01/2017    Past Surgical History:  Procedure Laterality Date  .  ELBOW SURGERY Right   . IR FLUORO GUIDE CV LINE RIGHT  02/02/2017  . IR US GUIDE VASC ACCESS RIGHT  02/02/2017        Home Medications    Prior to Admission medications   Medication Sig Start Date End Date Taking? Authorizing Provider  amLODipine (NORVASC) 10 MG tablet TAKE 1 TABLET BY MOUTH EVERY DAY 12/16/17   Henson, Vickie L, NP-C  blood glucose meter kit and supplies KIT Dispense based on patient and insurance preference. Use up to four times daily as directed. (FOR ICD-9 250.00, 250.01). 03/05/17   Horald Pollen, MD  [facility] B-1 100 MG tablet TAKE 1 TABLET EVERY DAY 07/20/17   Horald Pollen, MD  esomeprazole (NEXIUM) 40 MG capsule TAKE 1 CAPSULE (40 MG TOTAL) BY MOUTH DAILY AT 12 NOON. 12/01/17   Danis, Estill Cotta III, MD  lidocaine (LIDODERM) 5 % Place 1 patch onto the skin daily. Remove & Discard patch within 12 hours or as directed by MD 12/21/17   Sherwood Gambler, MD  metFORMIN (GLUCOPHAGE) 1000 MG tablet Take 1 tablet (1,000 mg total) by mouth daily with breakfast. 07/21/17   Raenette Rover, Vickie L, NP-C  metFORMIN (GLUCOPHAGE) 1000 MG tablet Take 1 tablet (1,000 mg total) by mouth daily with breakfast. 08/23/17   Henson, Vickie L, NP-C  methocarbamol (ROBAXIN) 500 MG tablet Take 1 tablet (500 mg total) by mouth every 8 (eight) hours as needed for muscle spasms. 12/21/17   Regenia Skeeter,  Nicki Reaper, MD  Multiple Vitamin (MULTIVITAMIN WITH MINERALS) TABS tablet Take 1 tablet by mouth daily. 02/08/17   Raiford Noble Latif, DO  RA VITAMIN B-1 100 MG TABS take 1 tablet by mouth once daily 05/05/17   Horald Pollen, MD  sucralfate (CARAFATE) 1 GM/10ML suspension Take 10 mLs (1 g total) by mouth 4 (four) times daily. 07/05/17   Doran Stabler, MD    Family History Family History  Problem Relation Age of Onset  . Hypertension Mother   . Hyperlipidemia Mother   . Hypertension Father   . Colon cancer Maternal Aunt     Social History Social History   Tobacco Use  . Smoking status:  Current Every Day Smoker    Packs/day: 0.50    Years: 15.00    Pack years: 7.50    Types: Cigarettes  . Smokeless tobacco: Never Used  Substance Use Topics  . Alcohol use: Yes    Comment: 3-4 beers per day most days  . Drug use: No     Allergies   Patient has no known allergies.   Review of Systems Review of Systems  Constitutional: Negative for fever.  Respiratory: Negative for cough and shortness of breath.   Cardiovascular: Negative for chest pain.  Gastrointestinal: Positive for abdominal pain. Negative for vomiting.  Musculoskeletal: Positive for back pain.  Skin: Negative for rash.  All other systems reviewed and are negative.    Physical Exam Updated Vital Signs BP (!) 135/92   Pulse 71   Temp 98.8 F (37.1 C) (Oral)   Resp (!) 25   Ht '5\' 10"'  (1.778 m)   Wt 89.8 kg   SpO2 93%   BMI 28.41 kg/m   Physical Exam  Constitutional: He is oriented to person, place, and time. He appears well-developed and well-nourished.  Non-toxic appearance. He does not appear ill. No distress.  HENT:  Head: Normocephalic and atraumatic.  Right Ear: External ear normal.  Left Ear: External ear normal.  Nose: Nose normal.  Eyes: Right eye exhibits no discharge. Left eye exhibits no discharge.  Neck: Neck supple.  Cardiovascular: Normal rate, regular rhythm and normal heart sounds.  Pulmonary/Chest: Effort normal and breath sounds normal.  Abdominal: Soft. There is tenderness (mild) in the right upper quadrant.  Musculoskeletal: He exhibits no edema.       Thoracic back: He exhibits tenderness. He exhibits no bony tenderness.       Back:  Neurological: He is alert and oriented to person, place, and time.  Skin: Skin is warm and dry.  Nursing note and vitals reviewed.    ED Treatments / Results  Labs (all labs ordered are listed, but only abnormal results are displayed) Labs Reviewed  CBC - Abnormal; Notable for the following components:      Result Value   WBC 11.8  (*)    All other components within normal limits  LIPASE, BLOOD - Abnormal; Notable for the following components:   Lipase 64 (*)    All other components within normal limits  COMPREHENSIVE METABOLIC PANEL - Abnormal; Notable for the following components:   Potassium 3.1 (*)    Glucose, Bld 123 (*)    ALT 50 (*)    All other components within normal limits  D-DIMER, QUANTITATIVE (NOT AT [facility])  URINALYSIS, ROUTINE W REFLEX MICROSCOPIC  I-STAT TROPONIN, ED  I-STAT TROPONIN, ED    EKG EKG Interpretation  Date/Time:  Tuesday December 21 2017 16:13:34 EDT Ventricular Rate:  79  PR Interval:  138 QRS Duration: 98 QT Interval:  412 QTC Calculation: 472 R Axis:   76 Text Interpretation:  Normal sinus rhythm no acute ST/T changes Confirmed by Sherwood Gambler [phone]) on 12/21/2017 7:00:28 PM   Radiology Dg Chest 2 View  Result Date: 12/21/2017 CLINICAL DATA:  Right-sided chest pain, dyspnea and cough. EXAM: CHEST - 2 VIEW COMPARISON:  02/06/2017 FINDINGS: Low lung volumes with bibasilar atelectasis. Heart size is top-normal. Mild uncoiling of the nonaneurysmal thoracic aorta. Minimal aortic atherosclerosis. Dialysis catheter has been removed since the interim. No acute osseous abnormality. IMPRESSION: Slightly low lung volumes with bibasilar atelectasis. Minimal aortic atherosclerosis. No active pulmonary disease. Electronically Signed   By: Ashley Royalty M.D.   On: 12/21/2017 16:56   Ct Renal Stone Study  Result Date: 12/21/2017 CLINICAL DATA:  Right lower abdominal pain while walking to car this afternoon. EXAM: CT ABDOMEN AND PELVIS WITHOUT CONTRAST TECHNIQUE: Multidetector CT imaging of the abdomen and pelvis was performed following the standard protocol without IV contrast. COMPARISON:  None. FINDINGS: Lower chest: Normal heart size. Trace right effusion with bibasilar subsegmental atelectasis. No pericardial effusion or thickening. Hepatobiliary: No focal liver abnormality is seen given  limitations of a noncontrast study. No gallstones, gallbladder wall thickening, or biliary dilatation. Pancreas: Unremarkable. No pancreatic ductal dilatation or surrounding inflammatory changes. Spleen: Normal in size without focal abnormality. Adrenals/Urinary Tract: Adrenal glands are unremarkable. Kidneys are normal, without renal calculi, focal lesion, or hydronephrosis. Bladder is unremarkable. Stomach/Bowel: Stomach is within normal limits. Appendix appears normal caliber measuring to 5 mm. No evidence of bowel wall thickening, distention, or inflammatory changes. Vascular/Lymphatic: No significant vascular findings are present. No enlarged abdominal or pelvic lymph nodes. Reproductive: Prostate and seminal vesicles are unremarkable. Other: Small fat containing periumbilical hernia. Musculoskeletal: Multilevel mild disc flattening of the lumbar spine greatest at L5-S1. No focal disc herniation, canal or foraminal encroachment. No pars defects or rest of osseous lesions. No fracture. IMPRESSION: 1. Normal appendix.  No bowel obstruction or inflammation. 2. No obstructive uropathy. 3. Mild multilevel disc flattening of the lumbar spine without focal disc herniation, canal or foraminal encroachment. Electronically Signed   By: Ashley Royalty M.D.   On: 12/21/2017 21:44    Procedures Procedures (including critical care time)  Medications Ordered in ED Medications  potassium chloride SA (K-DUR,KLOR-CON) CR tablet 40 mEq (40 mEq Oral Given 12/21/17 2018)  lactated ringers bolus 1,000 mL (0 mLs Intravenous Stopped 12/21/17 2209)  fentaNYL (SUBLIMAZE) injection 100 mcg (100 mcg Intravenous Given 12/21/17 2037)  diphenhydrAMINE (BENADRYL) injection 25 mg (25 mg Intravenous Given 12/21/17 2053)  diphenhydrAMINE (BENADRYL) capsule 50 mg (50 mg Oral Given 12/21/17 2140)  HYDROmorphone (DILAUDID) injection 1 mg (1 mg Intravenous Given 12/21/17 2140)     Initial Impression / Assessment and Plan / ED Course  I have  reviewed the triage vital signs and the nursing notes.  Pertinent labs & imaging results that were available during my care of the patient were reviewed by me and considered in my medical decision making (see chart for details).     Patient's pain is likely MSK. However given pleuritic pain and some thoracic pain in lower ribs in addition to pain wrapping around back, workup for PE, renal stone obtained. No obvious findings. Probably muscular.  Advised that while he is not supposed to take NSAIDs he should be allowed to take Tylenol.  He was also given a muscle relaxer and Lidoderm patch.  Otherwise, his work-up is benign  and he appears stable for discharge home.  I highly doubt ACS.  Return precautions.  No midline pain or neuro symptoms to suggest cord abnormality or emergency.  Final Clinical Impressions(s) / ED Diagnoses   Final diagnoses:  Acute right-sided thoracic back pain    ED Discharge Orders         Ordered    lidocaine (LIDODERM) 5 %  Every 24 hours     12/21/17 2158    methocarbamol (ROBAXIN) 500 MG tablet  Every 8 hours PRN     12/21/17 2158           Sherwood Gambler, MD 12/21/17 2221

## 2017-12-21 NOTE — Discharge Instructions (Signed)
If you develop worsening or uncontrolled pain, chest pain, trouble breathing, vomiting, weakness or numbness in your legs, incontinence, fevers or any other new/concerning symptoms then return to the ER. Otherwise follow up with your primary care doctor.

## 2017-12-21 NOTE — ED Notes (Signed)
Patient verbalizes understanding of medications and discharge instructions. No further questions at this time. VSS and patient ambulatory at discharge.   

## 2017-12-21 NOTE — ED Triage Notes (Signed)
Pt reports that he was walking to his car after work and started experiencing lower right abdominal pain, SOB, right neck pain, and midsternal CP. Described as a sharp shooting pain 10/10. 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

## 2017-12-23 ENCOUNTER — Ambulatory Visit: Payer: Commercial Managed Care - PPO | Admitting: Family Medicine

## 2017-12-31 ENCOUNTER — Other Ambulatory Visit: Payer: Self-pay | Admitting: Family Medicine

## 2017-12-31 DIAGNOSIS — E1165 Type 2 diabetes mellitus with hyperglycemia: Secondary | ICD-10-CM

## 2017-12-31 NOTE — Telephone Encounter (Signed)
Pt cancelled his appt last week and said he would call back to reschedule. Is this okay to refill for 30 more days

## 2017-12-31 NOTE — Telephone Encounter (Signed)
As long as he has an appt.

## 2017-12-31 NOTE — Telephone Encounter (Signed)
Left message, for pt to call back and schedule an appt

## 2018-01-16 ENCOUNTER — Other Ambulatory Visit: Payer: Self-pay | Admitting: Family Medicine

## 2018-01-17 NOTE — Telephone Encounter (Signed)
Pt has been called multiple times to schedule an appt.

## 2018-01-21 ENCOUNTER — Other Ambulatory Visit: Payer: Self-pay | Admitting: Family Medicine

## 2018-01-21 NOTE — Telephone Encounter (Signed)
Ok to give 30 days and then he will need to be seen before any more refills will be given.

## 2018-01-21 NOTE — Telephone Encounter (Signed)
PT wife was advised that we can only refill this for 30 DAYS ONLY... She was going to tell him to call us for an appt but she couldn't guarantee when he was going to schedule and I advised we would only refill for 30 days until he was had an appt to be seen.Marland Kitchen    Eric Hooper- this patient has been called multiple times to schedule and appt. Had an appt back in august but scheduled and has not rescheduled. Last appt was march 2019

## 2018-01-21 NOTE — Telephone Encounter (Signed)
This was advised already to wife

## 2018-02-13 ENCOUNTER — Other Ambulatory Visit: Payer: Self-pay | Admitting: Family Medicine

## 2018-02-22 ENCOUNTER — Other Ambulatory Visit: Payer: Self-pay

## 2018-02-22 MED ORDER — AMLODIPINE BESYLATE 10 MG PO TABS: 10.0000 mg | ORAL_TABLET | Freq: Every day | ORAL | 0 refills | Status: DC

## 2018-02-22 NOTE — Telephone Encounter (Signed)
No more refills until seen by provider.

## 2018-02-22 NOTE — Telephone Encounter (Signed)
Patient needs a refill on bp meds. He has scheduled his appt for Friday. He only has 2 pills left.

## 2018-02-25 ENCOUNTER — Encounter: Payer: Self-pay | Admitting: Family Medicine

## 2018-02-25 ENCOUNTER — Ambulatory Visit (INDEPENDENT_AMBULATORY_CARE_PROVIDER_SITE_OTHER): Payer: Commercial Managed Care - PPO | Admitting: Family Medicine

## 2018-02-25 VITALS — BP 130/84 | HR 71 | Wt 206.4 lb

## 2018-02-25 DIAGNOSIS — Z7289 Other problems related to lifestyle: Secondary | ICD-10-CM | POA: Diagnosis not present

## 2018-02-25 DIAGNOSIS — K219 Gastro-esophageal reflux disease without esophagitis: Secondary | ICD-10-CM

## 2018-02-25 DIAGNOSIS — E119 Type 2 diabetes mellitus without complications: Secondary | ICD-10-CM | POA: Insufficient documentation

## 2018-02-25 DIAGNOSIS — I10 Essential (primary) hypertension: Secondary | ICD-10-CM | POA: Diagnosis not present

## 2018-02-25 DIAGNOSIS — E7800 Pure hypercholesterolemia, unspecified: Secondary | ICD-10-CM

## 2018-02-25 DIAGNOSIS — R748 Abnormal levels of other serum enzymes: Secondary | ICD-10-CM

## 2018-02-25 DIAGNOSIS — R946 Abnormal results of thyroid function studies: Secondary | ICD-10-CM | POA: Diagnosis not present

## 2018-02-25 DIAGNOSIS — M6740 Ganglion, unspecified site: Secondary | ICD-10-CM

## 2018-02-25 DIAGNOSIS — E118 Type 2 diabetes mellitus with unspecified complications: Secondary | ICD-10-CM

## 2018-02-25 DIAGNOSIS — Z789 Other specified health status: Secondary | ICD-10-CM

## 2018-02-25 DIAGNOSIS — F17200 Nicotine dependence, unspecified, uncomplicated: Secondary | ICD-10-CM | POA: Diagnosis not present

## 2018-02-25 DIAGNOSIS — E1165 Type 2 diabetes mellitus with hyperglycemia: Secondary | ICD-10-CM | POA: Diagnosis not present

## 2018-02-25 HISTORY — DX: Type 2 diabetes mellitus with unspecified complications: E11.8

## 2018-02-25 LAB — POCT GLYCOSYLATED HEMOGLOBIN (HGB A1C): Hemoglobin A1C: 6.6 % — AB (ref 4.0–5.6)

## 2018-02-25 NOTE — Progress Notes (Signed)
   Subjective:    [name], male    DOB: 01/01/1976, 42 y.o.   MRN: [id number]  HPI Chief Complaint  Patient presents with  . med check    med check for follow-up on BP and DM and cyst on left wrist   He is here to follow up on chronic health conditions and for medication management check.  Diabetes-checks blood sugars daily and reports fasting blood sugars have been no higher than 130.  Reports good compliance with metformin and no side effects. He did not get an eye exam. Denies polyuria, polydipsia.  Hypertension-he does not check his blood pressure at home.  Reports taking amlodipine daily and no side effects.  His diet is unhealthy.  Reports eating fried food and a lot of carbs such as macaroni and cheese. Reports having an active job and he does walk his dog.  No exercise otherwise.  Alcohol use is daily 3-5 beers per day. Smoking one pack per week.   Left anterior wrist with a ganglion cyst that seems larger and is bothering him.  He would like to see a hand specialist  GERD- Nexium daily.  He did see GI and had an EGD.  States medication does not help with reflux some days.  He has not followed up with GI as recommended. He denies taking NSAIDs  Denies fever, chills, dizziness, chest pain, palpitations, shortness of breath, cough, abdominal pain, nausea, vomiting, diarrhea, lower extremity edema.  Reviewed allergies, medications, past medical, surgical, family, and social history.   Review of Systems Pertinent positives and negatives in the history of present illness.     Objective:   Physical Exam BP 130/84   Pulse 71   Wt 206 lb 6.4 oz (93.6 kg)   BMI 29.62 kg/m   Alert and in no distress. Pharyngeal area is normal. Neck is supple without adenopathy or thyromegaly. Cardiac exam shows a regular sinus rhythm without murmurs or gallops. Lungs are clear to auscultation. Smooth, round cyst on left posterior wrist that is non tender and illuminates.  Normal left wrist and hand sensation, ROM and strength.       Assessment & Plan:  Controlled diabetes mellitus type 2 with complications, unspecified whether long term insulin use ([place]) - Plan: HgB A1c, Microalbumin/Creatinine Ratio, Urine, CBC with Differential/Platelet, Comprehensive metabolic panel, TSH  Essential hypertension - Plan: CBC with Differential/Platelet, Comprehensive metabolic panel  Smoker  Alcohol use  Elevated LDL cholesterol level - Plan: Lipid panel  Elevated lipase - Plan: Lipase  Gastroesophageal reflux disease, esophagitis presence not specified  Diabetes appears to be well controlled.  Hgb A1c 6.6% he will continue on metformin.  Continue checking blood sugars at least 2-3 times per week. Recommend he get a diabetic eye exam. Urine microalbumin done Hypertension-blood pressure is close to goal range.  He will continue on amlodipine.  No obvious side effects. Counseling done on making healthy lifestyle changes such as stopping smoking and cutting back on daily alcohol use.  Discussed that these changes will also help improve his reflux. Counseling done on healthy diet to help control diabetes and hypertension.  Recommend he increase his physical activity. History of elevated lipase.  Recheck this today. He should follow-up with GI if GERD is not improving with Nexium Referral to hand specialist for ganglion cyst per patient request.  Follow-up pending labs

## 2018-02-25 NOTE — Patient Instructions (Signed)
Your hemoglobin A1c is 6.6% and well controlled.  Continue on your current medications.   Cut back on alcohol or stop.   Food Choices for Gastroesophageal Reflux Disease, Adult When you have gastroesophageal reflux disease (GERD), the foods you eat and your eating habits are very important. Choosing the right foods can help ease your discomfort. What guidelines do I need to follow?  Choose fruits, vegetables, whole grains, and low-fat dairy products.  Choose low-fat meat, fish, and poultry.  Limit fats such as oils, salad dressings, butter, nuts, and avocado.  Keep a food diary. This helps you identify foods that cause symptoms.  Avoid foods that cause symptoms. These may be different for everyone.  Eat small meals often instead of 3 large meals a day.  Eat your meals slowly, in a place where you are relaxed.  Limit fried foods.  Cook foods using methods other than frying.  Avoid drinking alcohol.  Avoid drinking large amounts of liquids with your meals.  Avoid bending over or lying down until 2-3 hours after eating. What foods are not recommended? These are some foods and drinks that may make your symptoms worse: Vegetables Tomatoes. Tomato juice. Tomato and spaghetti sauce. Chili peppers. Onion and garlic. Horseradish. Fruits Oranges, grapefruit, and lemon (fruit and juice). Meats High-fat meats, fish, and poultry. This includes hot dogs, ribs, ham, sausage, salami, and bacon. Dairy Whole milk and chocolate milk. Sour cream. Cream. Butter. Ice cream. Cream cheese. Drinks Coffee and tea. Bubbly (carbonated) drinks or energy drinks. Condiments Hot sauce. Barbecue sauce. Sweets/Desserts Chocolate and cocoa. Donuts. Peppermint and spearmint. Fats and Oils High-fat foods. This includes [place] fries and potato chips. Other Vinegar. Strong spices. This includes black pepper, white pepper, red pepper, cayenne, curry powder, cloves, ginger, and chili powder. The items  listed above may not be a complete list of foods and drinks to avoid. Contact your dietitian for more information. This information is not intended to replace advice given to you by your health care provider. Make sure you discuss any questions you have with your health care provider. Document Released: 10/13/2011 Document Revised: 09/19/2015 Document Reviewed: 02/15/2013 Elsevier Interactive Patient Education  2017 [employer].

## 2018-02-26 LAB — COMPREHENSIVE METABOLIC PANEL
ALT: 45 IU/L — ABNORMAL HIGH (ref 0–44)
AST: 22 IU/L (ref 0–40)
Albumin/Globulin Ratio: 1.9 (ref 1.2–2.2)
Albumin: 4.6 g/dL (ref 3.5–5.5)
Alkaline Phosphatase: 105 IU/L (ref 39–117)
BUN / CREAT RATIO: 20 (ref 9–20)
BUN: 15 mg/dL (ref 6–24)
Bilirubin Total: 0.6 mg/dL (ref 0.0–1.2)
CALCIUM: 9.6 mg/dL (ref 8.7–10.2)
CHLORIDE: 103 mmol/L (ref 96–106)
CO2: 25 mmol/L (ref 20–29)
Creatinine, Ser: 0.74 mg/dL — ABNORMAL LOW (ref 0.76–1.27)
GFR calc non Af Amer: 112 mL/min/{1.73_m2} (ref >59)
GFR, EST AFRICAN AMERICAN: 130 mL/min/{1.73_m2} (ref >59)
GLOBULIN, TOTAL: 2.4 g/dL (ref 1.5–4.5)
Glucose: 102 mg/dL — ABNORMAL HIGH (ref 65–99)
POTASSIUM: 3.7 mmol/L (ref 3.5–5.2)
SODIUM: 141 mmol/L (ref 134–144)
Total Protein: 7 g/dL (ref 6.0–8.5)

## 2018-02-26 LAB — CBC WITH DIFFERENTIAL/PLATELET
Basophils Absolute: 0.1 10*3/uL (ref 0.0–0.2)
Basos: 1 %
EOS (ABSOLUTE): 0.4 10*3/uL (ref 0.0–0.4)
EOS: 4 %
HEMATOCRIT: 43.8 % (ref 37.5–51.0)
Hemoglobin: 15.6 g/dL (ref 13.0–17.7)
IMMATURE GRANS (ABS): 0 10*3/uL (ref 0.0–0.1)
IMMATURE GRANULOCYTES: 0 %
LYMPHS: 51 %
Lymphocytes Absolute: 5.5 10*3/uL — ABNORMAL HIGH (ref 0.7–3.1)
MCH: 32.2 pg (ref 26.6–33.0)
MCHC: 35.6 g/dL (ref 31.5–35.7)
MCV: 91 fL (ref 79–97)
MONOS ABS: 0.7 10*3/uL (ref 0.1–0.9)
Monocytes: 7 %
NEUTROS PCT: 37 %
Neutrophils Absolute: 4 10*3/uL (ref 1.4–7.0)
PLATELETS: 235 10*3/uL (ref 150–450)
RBC: 4.84 x10E6/uL (ref 4.14–5.80)
RDW: 12.7 % (ref 12.3–15.4)
WBC: 10.7 10*3/uL (ref 3.4–10.8)

## 2018-02-26 LAB — MICROALBUMIN / CREATININE URINE RATIO
CREATININE, UR: 243.8 mg/dL
Microalb/Creat Ratio: 7.6 mg/g creat (ref 0.0–30.0)
Microalbumin, Urine: 18.5 ug/mL

## 2018-02-26 LAB — TSH: TSH: 0.399 u[IU]/mL — AB (ref 0.450–4.500)

## 2018-02-26 LAB — LIPID PANEL
CHOL/HDL RATIO: 5.1 ratio — AB (ref 0.0–5.0)
Cholesterol, Total: 200 mg/dL — ABNORMAL HIGH (ref 100–199)
HDL: 39 mg/dL — AB (ref >39)
LDL CALC: 104 mg/dL — AB (ref 0–99)
TRIGLYCERIDES: 283 mg/dL — AB (ref 0–149)
VLDL Cholesterol Cal: 57 mg/dL — ABNORMAL HIGH (ref 5–40)

## 2018-02-26 LAB — LIPASE: LIPASE: 86 U/L — AB (ref 13–78)

## 2018-02-28 ENCOUNTER — Other Ambulatory Visit: Payer: Self-pay | Admitting: Family Medicine

## 2018-02-28 ENCOUNTER — Encounter: Payer: Self-pay | Admitting: Family Medicine

## 2018-02-28 DIAGNOSIS — E1169 Type 2 diabetes mellitus with other specified complication: Secondary | ICD-10-CM | POA: Insufficient documentation

## 2018-02-28 DIAGNOSIS — E782 Mixed hyperlipidemia: Secondary | ICD-10-CM

## 2018-02-28 HISTORY — DX: Mixed hyperlipidemia: E78.2

## 2018-02-28 MED ORDER — ATORVASTATIN CALCIUM 20 MG PO TABS: 20.0000 mg | ORAL_TABLET | Freq: Every day | ORAL | 2 refills | Status: DC

## 2018-03-17 LAB — SPECIMEN STATUS REPORT

## 2018-03-17 LAB — T3: T3 TOTAL: 125 ng/dL (ref 71–180)

## 2018-03-17 LAB — T4, FREE: FREE T4: 1.24 ng/dL (ref 0.82–1.77)

## 2018-03-18 ENCOUNTER — Other Ambulatory Visit: Payer: Self-pay | Admitting: Family Medicine

## 2018-03-31 DIAGNOSIS — M654 Radial styloid tenosynovitis [de Quervain]: Secondary | ICD-10-CM | POA: Diagnosis not present

## 2018-03-31 DIAGNOSIS — M7989 Other specified soft tissue disorders: Secondary | ICD-10-CM | POA: Diagnosis not present

## 2018-03-31 DIAGNOSIS — M67432 Ganglion, left wrist: Secondary | ICD-10-CM | POA: Diagnosis not present

## 2018-04-03 ENCOUNTER — Other Ambulatory Visit: Payer: Self-pay | Admitting: Family Medicine

## 2018-04-03 DIAGNOSIS — E1165 Type 2 diabetes mellitus with hyperglycemia: Secondary | ICD-10-CM

## 2018-04-12 ENCOUNTER — Emergency Department (HOSPITAL_COMMUNITY)
Admission: EM | Admit: 2018-04-12 | Discharge: 2018-04-12 | Disposition: A | Discharge status: Home / Self Care | Payer: Commercial Managed Care - PPO | Attending: Emergency Medicine | Admitting: Emergency Medicine

## 2018-04-12 ENCOUNTER — Encounter (HOSPITAL_COMMUNITY): Payer: Self-pay | Admitting: *Deleted

## 2018-04-12 DIAGNOSIS — R1013 Epigastric pain: Secondary | ICD-10-CM

## 2018-04-12 DIAGNOSIS — R9431 Abnormal electrocardiogram [ECG] [EKG]: Secondary | ICD-10-CM | POA: Diagnosis not present

## 2018-04-12 DIAGNOSIS — M791 Myalgia, unspecified site: Secondary | ICD-10-CM

## 2018-04-12 DIAGNOSIS — F17210 Nicotine dependence, cigarettes, uncomplicated: Secondary | ICD-10-CM | POA: Insufficient documentation

## 2018-04-12 DIAGNOSIS — I10 Essential (primary) hypertension: Secondary | ICD-10-CM | POA: Diagnosis not present

## 2018-04-12 DIAGNOSIS — Z7984 Long term (current) use of oral hypoglycemic drugs: Secondary | ICD-10-CM | POA: Insufficient documentation

## 2018-04-12 DIAGNOSIS — E119 Type 2 diabetes mellitus without complications: Secondary | ICD-10-CM | POA: Diagnosis not present

## 2018-04-12 LAB — COMPREHENSIVE METABOLIC PANEL
ALT: 59 U/L — AB (ref 0–44)
AST: 34 U/L (ref 15–41)
Albumin: 4.3 g/dL (ref 3.5–5.0)
Alkaline Phosphatase: 89 U/L (ref 38–126)
Anion gap: 13 (ref 5–15)
BUN: 8 mg/dL (ref 6–20)
CO2: 28 mmol/L (ref 22–32)
Calcium: 9.3 mg/dL (ref 8.9–10.3)
Chloride: 100 mmol/L (ref 98–111)
Creatinine, Ser: 0.93 mg/dL (ref 0.61–1.24)
GFR calc Af Amer: >60 mL/min (ref >60)
GFR calc non Af Amer: >60 mL/min (ref >60)
GLUCOSE: 197 mg/dL — AB (ref 70–99)
Potassium: 3.2 mmol/L — ABNORMAL LOW (ref 3.5–5.1)
Sodium: 141 mmol/L (ref 135–145)
Total Bilirubin: 1.1 mg/dL (ref 0.3–1.2)
Total Protein: 6.8 g/dL (ref 6.5–8.1)

## 2018-04-12 LAB — URINALYSIS, ROUTINE W REFLEX MICROSCOPIC
BACTERIA UA: NONE SEEN
Bilirubin Urine: NEGATIVE
Glucose, UA: >=500 mg/dL — AB
Hgb urine dipstick: NEGATIVE
Ketones, ur: NEGATIVE mg/dL
Leukocytes, UA: NEGATIVE
Nitrite: NEGATIVE
Protein, ur: NEGATIVE mg/dL
Specific Gravity, Urine: 1.018 (ref 1.005–1.030)
pH: 6 (ref 5.0–8.0)

## 2018-04-12 LAB — CBC
HEMATOCRIT: 47.7 % (ref 39.0–52.0)
Hemoglobin: 16 g/dL (ref 13.0–17.0)
MCH: 30.8 pg (ref 26.0–34.0)
MCHC: 33.5 g/dL (ref 30.0–36.0)
MCV: 91.9 fL (ref 80.0–100.0)
Platelets: 211 10*3/uL (ref 150–400)
RBC: 5.19 MIL/uL (ref 4.22–5.81)
RDW: 12.6 % (ref 11.5–15.5)
WBC: 10.3 10*3/uL (ref 4.0–10.5)
nRBC: 0 % (ref 0.0–0.2)

## 2018-04-12 LAB — LIPASE, BLOOD: Lipase: 65 U/L — ABNORMAL HIGH (ref 11–51)

## 2018-04-12 LAB — CK: Total CK: 228 U/L (ref 49–397)

## 2018-04-12 NOTE — ED Provider Notes (Signed)
[facility] EMERGENCY DEPARTMENT Provider Note   CSN: [id number] Arrival date & time: 04/12/18  3825     History   Chief Complaint Chief Complaint  Patient presents with  . Abdominal Pain    HPI Eric Hooper is a 42 y.o. male.  HPI   42 year old male presents today with complaints of abdominal pain.  Patient notes that 4 days ago he started taking atorvastatin for high cholesterol.  Patient notes later on that day he developed a dull ache in his upper abdomen with diffuse soreness in his joints.  He denies any nausea vomiting or fever, denies any swelling or rashes, notes some loose stools.  Patient denies any lower abdominal pain.  He notes no medications needed for pain.  Patient reports that he does drink alcohol approximately 3-4 times per week noting several beers to a sixpack each time.  He denies any associated indigestion.  Patient also reports he is a smoker.     Past Medical History:  Diagnosis Date  . Controlled diabetes mellitus type 2 with complications ([place]) 08/27/9765  . Diabetes mellitus without complication (HCC)    non-insulin dependent  . Diverticulosis 02/01/2017   on CT scan abd/pelvis  . GERD (gastroesophageal reflux disease)   . Hypertension   . Kidney stones 02/03/2017  . Mixed hyperlipidemia 02/28/2018  . Smoker 04/13/2017    Patient Active Problem List   Diagnosis Date Noted  . Mixed hyperlipidemia 02/28/2018  . Controlled diabetes mellitus type 2 with complications ([place]) 34/19/3790  . Phlebitis 05/12/2017  . Leg swelling 05/12/2017  . Smoker 04/13/2017  . GERD (gastroesophageal reflux disease)   . Hypertension   . TTP (thrombotic thrombocytopenic purpura) ([place]) 02/06/2017  . Cough 02/05/2017  . Alcohol use 02/03/2017  . Renal insufficiency 02/03/2017  . Leukocytosis 02/03/2017  . Thrombocytopenia ([place]) 02/01/2017  . Hypokalemia 02/01/2017  . Macrocytic anemia 02/01/2017  . Hyperglycemia 02/01/2017  . Microscopic  hematuria 02/01/2017  . Diverticulosis 02/01/2017    Past Surgical History:  Procedure Laterality Date  . ELBOW SURGERY Right   . IR FLUORO GUIDE CV LINE RIGHT  02/02/2017  . IR US GUIDE VASC ACCESS RIGHT  02/02/2017        Home Medications    Prior to Admission medications   Medication Sig Start Date End Date Taking? Authorizing Provider  amLODipine (NORVASC) 10 MG tablet TAKE 1 TABLET BY MOUTH EVERY DAY 03/18/18   Henson, Vickie L, NP-C  atorvastatin (LIPITOR) 20 MG tablet Take 1 tablet (20 mg total) by mouth daily. 02/28/18   Henson, Vickie L, NP-C  blood glucose meter kit and supplies KIT Dispense based on patient and insurance preference. Use up to four times daily as directed. (FOR ICD-9 250.00, 250.01). 03/05/17   Horald Pollen, MD  esomeprazole (NEXIUM) 40 MG capsule TAKE 1 CAPSULE (40 MG TOTAL) BY MOUTH DAILY AT 12 NOON. 12/01/17   Doran Stabler, MD  metFORMIN (GLUCOPHAGE) 1000 MG tablet Take 1 tablet (1,000 mg total) by mouth daily with breakfast. 07/21/17   Henson, Vickie L, NP-C  metFORMIN (GLUCOPHAGE) 1000 MG tablet TAKE 1 TABLET BY MOUTH EVERY DAY WITH BREAKFAST 04/04/18   Girtha Rm, NP-C    Family History Family History  Problem Relation Age of Onset  . Hypertension Mother   . Hyperlipidemia Mother   . Hypertension Father   . Colon cancer Maternal Aunt     Social History Social History   Tobacco Use  . Smoking status:  Current Every Day Smoker    Packs/day: 0.50    Years: 15.00    Pack years: 7.50    Types: Cigarettes  . Smokeless tobacco: Never Used  Substance Use Topics  . Alcohol use: Yes    Comment: 3-4 beers per day most days  . Drug use: No     Allergies   Ibuprofen   Review of Systems Review of Systems  All other systems reviewed and are negative.    Physical Exam Updated Vital Signs BP (!) 146/101 (BP Location: Right Arm)   Pulse (!) 59   Temp 98.4 F (36.9 C) (Oral)   Resp 16   Ht '5\' 10"'  (1.778 m)   Wt 91.2 kg    SpO2 100%   BMI 28.84 kg/m   Physical Exam Vitals signs and nursing note reviewed.  Constitutional:      Appearance: He is well-developed.  HENT:     Head: Normocephalic and atraumatic.  Eyes:     General: No scleral icterus.       Right eye: No discharge.        Left eye: No discharge.     Conjunctiva/sclera: Conjunctivae normal.     Pupils: Pupils are equal, round, and reactive to light.  Neck:     Musculoskeletal: Normal range of motion.     Vascular: No JVD.     Trachea: No tracheal deviation.  Pulmonary:     Effort: Pulmonary effort is normal.     Breath sounds: No stridor.  Abdominal:     Comments: Minimal epigastric abdominal tenderness to palpation, no masses rebound or guarding remainder abdomen soft nontender  Neurological:     Mental Status: He is alert and oriented to person, place, and time.     Coordination: Coordination normal.  Psychiatric:        Behavior: Behavior normal.        Thought Content: Thought content normal.        Judgment: Judgment normal.      ED Treatments / Results  Labs (all labs ordered are listed, but only abnormal results are displayed) Labs Reviewed  LIPASE, BLOOD - Abnormal; Notable for the following components:      Result Value   Lipase 65 (*)    All other components within normal limits  COMPREHENSIVE METABOLIC PANEL - Abnormal; Notable for the following components:   Potassium 3.2 (*)    Glucose, Bld 197 (*)    ALT 59 (*)    All other components within normal limits  URINALYSIS, ROUTINE W REFLEX MICROSCOPIC - Abnormal; Notable for the following components:   Glucose, UA >=500 (*)    All other components within normal limits  CBC  CK    EKG EKG Interpretation  Date/Time:  Tuesday April 12 2018 06:42:56 EST Ventricular Rate:  76 PR Interval:  138 QRS Duration: 106 QT Interval:  410 QTC Calculation: 461 R Axis:   84 Text Interpretation:  Normal sinus rhythm T wave abnormality, consider inferior ischemia  Prolonged QT Abnormal ECG artifact limits interpretation but overall similar to previous Confirmed by Theotis Burrow [phone]) on 04/12/2018 9:53:28 AM   Radiology No results found.  Procedures Procedures (including critical care time)  Medications Ordered in ED Medications - No data to display   Initial Impression / Assessment and Plan / ED Course  I have reviewed the triage vital signs and the nursing notes.  Pertinent labs & imaging results that were available during my care of the patient  were reviewed by me and considered in my medical decision making (see chart for details).     41 year old male presents today with upper abdominal pain.  Question gastritis versus medication side effect.  Patient also has a slightly elevated lipase questioning mild pancreatitis.  I counseled patient extensively on quitting smoking and drinking, he will contact his primary care provider avoiding medications until he can discuss this with them.  No signs of rhabdo no signs of significant infection strict return precautions given.  Patient verbalized understanding and agreement to today's plan had no further questions or concerns the time discharge.  Final Clinical Impressions(s) / ED Diagnoses   Final diagnoses:  Epigastric pain  Myalgia    ED Discharge Orders    None       Francee Gentile 04/12/18 1116    Little, Wenda Overland, MD 04/13/18 [phone]

## 2018-04-12 NOTE — ED Triage Notes (Signed)
Pt in c/o mid upper abd pain onset x 4 days with generalized joint aches, denies SOB, pt A&O x4

## 2018-04-12 NOTE — Discharge Instructions (Addendum)
Please read the attached information.  Please see your laboratory results.  Your lipase is elevated today.  I highly encourage you to discontinue drinking alcohol and follow-up with your primary care for management of elevation.  Please talk to your primary care about recent medication and your current symptoms as there is a chance they could be related to this medication.  Please return immediately if develop any new or worsening signs or symptoms.

## 2018-04-13 ENCOUNTER — Telehealth: Payer: Self-pay | Admitting: Family Medicine

## 2018-04-13 NOTE — Telephone Encounter (Signed)
He really should not take another statin if he had an adverse reaction to atorvastatin. We will need to discuss this at some point.

## 2018-04-13 NOTE — Telephone Encounter (Signed)
Left detailed message about pt needing to be see at some point to talk about medicine

## 2018-04-13 NOTE — Telephone Encounter (Signed)
Maria called & states pt went to ED and was told had reaction to Atorvastatin and not to take it anymore.  Wants to know if you will send in something different for cholesterol.  I asked about scheduling a hospital follow up and she states he can't at this time, he can't miss any more work.

## 2018-05-04 ENCOUNTER — Telehealth: Payer: Self-pay | Admitting: Family Medicine

## 2018-05-04 NOTE — Telephone Encounter (Signed)
Dismissal letter in [employer]  °

## 2018-05-20 ENCOUNTER — Other Ambulatory Visit: Payer: Self-pay

## 2018-05-20 DIAGNOSIS — D481 Neoplasm of uncertain behavior of connective and other soft tissue: Secondary | ICD-10-CM | POA: Diagnosis not present

## 2018-05-20 DIAGNOSIS — M654 Radial styloid tenosynovitis [de Quervain]: Secondary | ICD-10-CM | POA: Diagnosis not present

## 2018-05-20 DIAGNOSIS — R2232 Localized swelling, mass and lump, left upper limb: Secondary | ICD-10-CM | POA: Diagnosis not present

## 2018-05-24 DIAGNOSIS — M654 Radial styloid tenosynovitis [de Quervain]: Secondary | ICD-10-CM | POA: Diagnosis not present

## 2018-05-24 DIAGNOSIS — M7989 Other specified soft tissue disorders: Secondary | ICD-10-CM | POA: Diagnosis not present

## 2018-06-17 ENCOUNTER — Other Ambulatory Visit: Payer: Self-pay | Admitting: Family Medicine

## 2018-06-30 DIAGNOSIS — M654 Radial styloid tenosynovitis [de Quervain]: Secondary | ICD-10-CM | POA: Diagnosis not present

## 2018-06-30 DIAGNOSIS — R29898 Other symptoms and signs involving the musculoskeletal system: Secondary | ICD-10-CM | POA: Diagnosis not present

## 2018-06-30 DIAGNOSIS — M25532 Pain in left wrist: Secondary | ICD-10-CM | POA: Diagnosis not present

## 2018-07-05 DIAGNOSIS — R29898 Other symptoms and signs involving the musculoskeletal system: Secondary | ICD-10-CM | POA: Diagnosis not present

## 2018-07-05 DIAGNOSIS — M25532 Pain in left wrist: Secondary | ICD-10-CM | POA: Diagnosis not present

## 2018-07-05 DIAGNOSIS — M654 Radial styloid tenosynovitis [de Quervain]: Secondary | ICD-10-CM | POA: Diagnosis not present

## 2018-07-11 ENCOUNTER — Telehealth: Payer: Self-pay | Admitting: Family Medicine

## 2018-07-11 ENCOUNTER — Encounter: Payer: Self-pay | Admitting: Family Medicine

## 2018-07-11 ENCOUNTER — Other Ambulatory Visit: Payer: Self-pay

## 2018-07-11 ENCOUNTER — Ambulatory Visit (INDEPENDENT_AMBULATORY_CARE_PROVIDER_SITE_OTHER): Payer: Commercial Managed Care - PPO | Admitting: Family Medicine

## 2018-07-11 ENCOUNTER — Telehealth: Payer: Self-pay

## 2018-07-11 VITALS — BP 152/100 | HR 94 | Temp 98.7°F | Wt 202.5 lb

## 2018-07-11 DIAGNOSIS — E782 Mixed hyperlipidemia: Secondary | ICD-10-CM

## 2018-07-11 DIAGNOSIS — Z9189 Other specified personal risk factors, not elsewhere classified: Secondary | ICD-10-CM | POA: Insufficient documentation

## 2018-07-11 DIAGNOSIS — E118 Type 2 diabetes mellitus with unspecified complications: Secondary | ICD-10-CM

## 2018-07-11 DIAGNOSIS — R05 Cough: Secondary | ICD-10-CM

## 2018-07-11 DIAGNOSIS — Z7689 Persons encountering health services in other specified circumstances: Secondary | ICD-10-CM

## 2018-07-11 DIAGNOSIS — E1165 Type 2 diabetes mellitus with hyperglycemia: Secondary | ICD-10-CM

## 2018-07-11 DIAGNOSIS — K219 Gastro-esophageal reflux disease without esophagitis: Secondary | ICD-10-CM

## 2018-07-11 DIAGNOSIS — R079 Chest pain, unspecified: Secondary | ICD-10-CM

## 2018-07-11 DIAGNOSIS — M654 Radial styloid tenosynovitis [de Quervain]: Secondary | ICD-10-CM | POA: Diagnosis not present

## 2018-07-11 DIAGNOSIS — I10 Essential (primary) hypertension: Secondary | ICD-10-CM

## 2018-07-11 DIAGNOSIS — R059 Cough, unspecified: Secondary | ICD-10-CM

## 2018-07-11 DIAGNOSIS — F17200 Nicotine dependence, unspecified, uncomplicated: Secondary | ICD-10-CM

## 2018-07-11 HISTORY — DX: Other specified personal risk factors, not elsewhere classified: Z91.89

## 2018-07-11 LAB — POCT GLYCOSYLATED HEMOGLOBIN (HGB A1C): HbA1c, POC (controlled diabetic range): 8 % — AB (ref 0.0–7.0)

## 2018-07-11 MED ORDER — DICLOFENAC SODIUM 1 % TD GEL: 2.0000 g | Freq: Four times a day (QID) | TRANSDERMAL | 1 refills | Status: AC | PRN

## 2018-07-11 MED ORDER — ESOMEPRAZOLE MAGNESIUM 40 MG PO CPDR: 40.0000 mg | DELAYED_RELEASE_CAPSULE | Freq: Two times a day (BID) | ORAL | 1 refills | Status: DC

## 2018-07-11 MED ORDER — ATORVASTATIN CALCIUM 40 MG PO TABS: 40.0000 mg | ORAL_TABLET | Freq: Every day | ORAL | 1 refills | Status: DC

## 2018-07-11 NOTE — Assessment & Plan Note (Signed)
Likely source of epigastric pain, does not explain radiation to left arm.  Normal EGD 1 year ago.  Will increase Nexium to 40 twice daily for 1 month just to see if it affects pain.  Also counseled on need to reduce smoking as this is likely worsening GERD.  Also gave dietary handout to assist with GERD symptoms and diet.

## 2018-07-11 NOTE — Telephone Encounter (Signed)
Received fax from [place], [place] needed on Diclofenac gel.  Clinical questions submitted via Cover My Meds.  Waiting on response, could take up to 72 hours.  Cover My Meds info: Key: [id number]  Esau Grew, RN

## 2018-07-11 NOTE — Assessment & Plan Note (Signed)
Counseled on risk of heart disease and stroke related to smoking.  Patient try to cut back.  Will address at next visit.

## 2018-07-11 NOTE — Assessment & Plan Note (Signed)
High ASCVD risk score with chronic chest pain.  Multiple risk factors including African-American male, smoker, hypertension, uncontrolled diabetes, hyperlipidemia not controlled on statin.  Plan to refer to cardiology.

## 2018-07-11 NOTE — Telephone Encounter (Signed)
Pt wife called requesting refill on pt's amlodipine and a prescription for a medication for his dry cough. Pt thought these were being sent to the pharmacy when he was here this morning. Please call pt once this has been done.

## 2018-07-11 NOTE — Assessment & Plan Note (Signed)
Elevated today.  Patient states that still continued pain.  He did take his amlodipine this morning.  While patient monitor blood pressure at home.  We will try to address pain, have patient follow-up in 1 month in room measure blood pressure.

## 2018-07-11 NOTE — Telephone Encounter (Signed)
Will forward to MD. Shiann Kam,CMA  

## 2018-07-11 NOTE — Telephone Encounter (Signed)
Denied per CoverMyMeds for following reason:   Danley Danker, RN [facility] RN)

## 2018-07-11 NOTE — Progress Notes (Signed)
New Patient Office Visit  Subjective:  [name], male    DOB: [birth year]  Age: 43 y.o. MRN: [id number]  CC:  Chief Complaint  Patient presents with  . New Patient (Initial Visit)    HPI [name] presents to establish with new health provider.   Long standing chest pain/epigastric pain. Over 1 year. Constant. Slowly getting worse. Now radiates to left shoulder. Patient has been taking tylenol, 1000 mg qid. Helps, but still has pain. Patient's have tried heat as well, w/o much improvement. CT abdomen pelvis 01/2017 showed diffuse gastric wall thickening, Radiology recommended endoscopy for correlation. Patient feels different from GERD. CXR on 11/2017 no acute cardiopulmonary disease. Has followed with GI, who felt that it was due to NSAID use and uncontrolled GERD + smoking.   GERD Controlled with Nexium 40 mg daily. Had normal endoscopy w/ biopsy on 05/2017.   TTP Found in 2018 secondary to chronic ?NSAID use. Used to follow with Hem/Oncology Dr. Wendi Snipes. Has not followed up since.   History of ?ganglion cyst left forearm, s/p surgical resection - Dr. Burney Gauze, 05/2018  De Quervain's tenosynovitis - where a wrist brace. Tylenol for pain.   Hypertension Takes amlodipine. BP today 150/100, says that it is higher when he is in pain.   Diabetes Type II, Uncontrolled, A1C today 8.0  Patient eats chocolate, breads, mashed potatoes. Takes metformin 1059m BID. Last A1C 6.6 in Nov, 2019.   Mixed Hyperlipidemia H/o of noncompliance with atorvastatin 20 mg.     Component Value Date/Time   CHOL 200 (H) 02/25/2018 1650   TRIG 283 (H) 02/25/2018 1650   HDL 39 (L) 02/25/2018 1650   CHOLHDL 5.1 (H) 02/25/2018 1650   LDLCALC 104 (H) 02/25/2018 1650   Smoking abuse, long time. Smoking reviewed in history of chart belwo. Pt states he trying to cut back.    The 10-year ASCVD risk score (Mikey BussingDC JBrooke Bonito, et al., 2013) is: 28.2%   Values used to calculate the score:  Age: [age]years     Sex: Male     Is Non-Hispanic African American: Yes     Diabetic: Yes     Tobacco smoker: Yes     Systolic Blood Pressure: 1570mmHg     Is BP treated: Yes     HDL Cholesterol: 39 mg/dL     Total Cholesterol: 200 mg/dL      Past Medical History:  Diagnosis Date  . Controlled diabetes mellitus type 2 with complications ([place] 117/10/9388 . Diabetes mellitus without complication (HCC)    non-insulin dependent  . Diverticulosis 02/01/2017   on CT scan abd/pelvis  . GERD (gastroesophageal reflux disease)   . Hypertension   . Kidney stones 02/03/2017  . Mixed hyperlipidemia 02/28/2018  . Smoker 04/13/2017    Past Surgical History:  Procedure Laterality Date  . ELBOW SURGERY Right   . IR FLUORO GUIDE CV LINE RIGHT  02/02/2017  . IR [place] VASC ACCESS RIGHT  02/02/2017    Family History  Problem Relation Age of Onset  . Hypertension Mother   . Hyperlipidemia Mother   . Hypertension Father   . Colon cancer Maternal Aunt     Social History   Socioeconomic History  . Marital status: Single    Spouse name: Not on file  . Number of children: 2  . Years of education: Not on file  . Highest education level: Not on file  Occupational History  . Occupation: mGlass blower/designer  Social Needs  . Financial resource strain: Not on file  . Food insecurity:    Worry: Not on file    Inability: Not on file  . Transportation needs:    Medical: Not on file    Non-medical: Not on file  Tobacco Use  . Smoking status: Current Every Day Smoker    Packs/day: 0.50    Years: 15.00    Pack years: 7.50    Types: Cigarettes  . Smokeless tobacco: Never Used  Substance and Sexual Activity  . Alcohol use: Yes    Comment: 3-4 beers per day most days  . Drug use: No  . Sexual activity: Not on file  Lifestyle  . Physical activity:    Days per week: Not on file    Minutes per session: Not on file  . Stress: Not on file  Relationships  . Social connections:    Talks on  phone: Not on file    Gets together: Not on file    Attends religious service: Not on file    Active member of club or organization: Not on file    Attends meetings of clubs or organizations: Not on file    Relationship status: Not on file  . Intimate partner violence:    Fear of current or ex partner: Not on file    Emotionally abused: Not on file    Physically abused: Not on file    Forced sexual activity: Not on file  Other Topics Concern  . Not on file  Social History Narrative  . Not on file    ROS Review of Systems  Respiratory: Negative for apnea, cough, choking, chest tightness, shortness of breath, wheezing and stridor.   Cardiovascular: Positive for chest pain. Negative for leg swelling.  Gastrointestinal: Positive for abdominal pain. Negative for abdominal distention, anal bleeding, blood in stool, constipation, diarrhea, nausea, rectal pain and vomiting.  All other systems reviewed and are negative.   Objective:   Today's Vitals: BP (!) 152/100   Pulse 94   Temp 98.7 F (37.1 C) (Oral)   Wt 202 lb 8 oz (91.9 kg)   SpO2 98%   BMI 29.06 kg/m   Gen: NAD, resting comfortably CV: RRR with no murmurs appreciated Pulm: NWOB, CTAB with no crackles, wheezes, or rhonchi GI: epigastrium tender to palpation, Normal bowel sounds present. Soft,  MSK: no edema, cyanosis, or clubbing noted Skin: warm, dry Neuro: grossly normal, moves all extremities Psych: Normal affect and thought content   Assessment & Plan:   Problem List Items Addressed This Visit      Cardiovascular and Mediastinum   Hypertension    Elevated today.  Patient states that still continued pain.  He did take his amlodipine this morning.  While patient monitor blood pressure at home.  We will try to address pain, have patient follow-up in 1 month in room measure blood pressure.      Relevant Medications   atorvastatin (LIPITOR) 40 MG tablet     Digestive   GERD (gastroesophageal reflux disease)     Likely source of epigastric pain, does not explain radiation to left arm.  Normal EGD 1 year ago.  Will increase Nexium to 40 twice daily for 1 month just to see if it affects pain.  Also counseled on need to reduce smoking as this is likely worsening GERD.  Also gave dietary handout to assist with GERD symptoms and diet.      Relevant Medications   esomeprazole (NEXIUM)  40 MG capsule     Endocrine   Uncontrolled diabetes mellitus (HCC)    Elevated A1c today at 8.0.  Last A1c was 6.6.  Patient change in lifestyle modifications, admit to eating more chocolate and ice cream.  Controlled on metformin thousand milligrams twice daily.  Counseled on diet.  Patient follow-up in 1 month to discuss further.      Relevant Medications   atorvastatin (LIPITOR) 40 MG tablet     Other   Tobacco use disorder    Counseled on risk of heart disease and stroke related to smoking.  Patient try to cut back.  Will address at next visit.      Mixed hyperlipidemia   Relevant Medications   atorvastatin (LIPITOR) 40 MG tablet   At risk for dysfunction of heart    High ASCVD risk score with chronic chest pain.  Multiple risk factors including African-American male, smoker, hypertension, uncontrolled diabetes, hyperlipidemia not controlled on statin.  Plan to refer to cardiology.       Other Visit Diagnoses    Establishing care with new doctor, encounter for    -  Primary   De Quervain's tenosynovitis, left       Relevant Medications   diclofenac sodium (VOLTAREN) 1 % GEL   Chest pain, unspecified type       Relevant Orders   Ambulatory referral to Cardiology      Outpatient Encounter Medications as of 07/11/2018  Medication Sig  . [DISCONTINUED] amLODipine (NORVASC) 10 MG tablet TAKE 1 TABLET BY MOUTH EVERY DAY  . [DISCONTINUED] esomeprazole (NEXIUM) 40 MG capsule Take by mouth.  . [DISCONTINUED] metFORMIN (GLUCOPHAGE) 1000 MG tablet Take by mouth.  Marland Kitchen amLODipine (NORVASC) 10 MG tablet TAKE 1 TABLET  BY MOUTH EVERY DAY  . atorvastatin (LIPITOR) 40 MG tablet Take 1 tablet (40 mg total) by mouth daily.  . blood glucose meter kit and supplies KIT Dispense based on patient and insurance preference. Use up to four times daily as directed. (FOR ICD-9 250.00, 250.01).  Marland Kitchen diclofenac sodium (VOLTAREN) 1 % GEL Apply 2 g topically 4 (four) times daily as needed for up to 25 days (apply to left wrist).  Marland Kitchen esomeprazole (NEXIUM) 40 MG capsule Take 1 capsule (40 mg total) by mouth 2 (two) times daily before a meal.  . metFORMIN (GLUCOPHAGE) 1000 MG tablet Take 1 tablet (1,000 mg total) by mouth daily with breakfast.  . [DISCONTINUED] atorvastatin (LIPITOR) 20 MG tablet Take 1 tablet (20 mg total) by mouth daily.  . [DISCONTINUED] esomeprazole (NEXIUM) 40 MG capsule TAKE 1 CAPSULE (40 MG TOTAL) BY MOUTH DAILY AT 12 NOON.  . [DISCONTINUED] metFORMIN (GLUCOPHAGE) 1000 MG tablet TAKE 1 TABLET BY MOUTH EVERY DAY WITH BREAKFAST   Facility-Administered Encounter Medications as of 07/11/2018  Medication  . 0.9 %  sodium chloride infusion    Follow-up: Return in about 4 weeks (around 08/08/2018) for BP, DM.   Bonnita Hollow, MD

## 2018-07-11 NOTE — Patient Instructions (Signed)
It was a pleasure to see you today! Thank you for choosing [facility] for your primary care. Eric Hooper was seen to establish with new provider.   1. For your GERD, we are increasing your nexium to twice a day. Also look at the dietary guidance provided. 2. For your chest pain/epigastric pain, we are referring you to a heart doctor.  3. For your cholesterol, we are starting you on a cholesterol pill called atorvastatin 4. For your wrist pain, we are prescribing you a cream called diclofenac.  5. For your blood pressure, we are refilling your BP medication. Check your blood pressure at home 6. For your diabetes, continue to monitor your diet and reduce carbohydrates and follow up with me in 1 month.    Best,  Marny Lowenstein, MD, MS FAMILY MEDICINE RESIDENT - PGY2 07/11/2018 10:13 AM

## 2018-07-11 NOTE — Assessment & Plan Note (Signed)
Elevated A1c today at 8.0.  Last A1c was 6.6.  Patient change in lifestyle modifications, admit to eating more chocolate and ice cream.  Controlled on metformin thousand milligrams twice daily.  Counseled on diet.  Patient follow-up in 1 month to discuss further.

## 2018-07-12 ENCOUNTER — Other Ambulatory Visit: Payer: Self-pay | Admitting: Family Medicine

## 2018-07-12 DIAGNOSIS — I10 Essential (primary) hypertension: Secondary | ICD-10-CM

## 2018-07-12 MED ORDER — AMLODIPINE BESYLATE 10 MG PO TABS: 10.0000 mg | ORAL_TABLET | Freq: Every day | ORAL | 5 refills | Status: DC

## 2018-07-12 MED ORDER — BENZONATATE 100 MG PO CAPS: 100.0000 mg | ORAL_CAPSULE | Freq: Two times a day (BID) | ORAL | 0 refills | Status: DC | PRN

## 2018-07-12 NOTE — Telephone Encounter (Signed)
Patient calling requesting to know what to do with the gel being declined by insurance.  Call back is [phone].  Danley Danker, RN [facility] RN)

## 2018-07-13 ENCOUNTER — Encounter: Payer: Self-pay | Admitting: Family Medicine

## 2018-07-13 NOTE — Telephone Encounter (Signed)
Spoke with pts wife Verdis Frederickson. I informed her of the message that Dr. Grandville Silos will try and appeal it once he gets the PA. pts wife understood.  Salvatore Marvel, CMA

## 2018-08-03 ENCOUNTER — Other Ambulatory Visit: Payer: Self-pay | Admitting: Family Medicine

## 2018-08-03 DIAGNOSIS — E782 Mixed hyperlipidemia: Secondary | ICD-10-CM

## 2018-08-12 ENCOUNTER — Other Ambulatory Visit: Payer: Self-pay

## 2018-08-12 ENCOUNTER — Telehealth (INDEPENDENT_AMBULATORY_CARE_PROVIDER_SITE_OTHER): Payer: Commercial Managed Care - PPO | Admitting: Family Medicine

## 2018-08-12 DIAGNOSIS — T148XXA Other injury of unspecified body region, initial encounter: Secondary | ICD-10-CM

## 2018-08-12 DIAGNOSIS — R0789 Other chest pain: Secondary | ICD-10-CM | POA: Diagnosis not present

## 2018-08-12 NOTE — Progress Notes (Signed)
[place] Telemedicine Visit  Patient consented to have virtual visit. Method of visit: Telephone  Encounter participants: Patient: Eric Hooper - located at home Provider: Annabell Sabal - located at office Others (if applicable): Wife present and provides history in background  Chief Complaint: Right sided chest pain  HPI:  Patient with 1 day history of right-sided chest pain.  He is back to work this past Monday and works in [place] [employer] after a 2-week break from work.  He believes he pulled a muscle yesterday or the day before while at work.  He woke this morning pain on the right side of his chest.  Pain did not awaken him, just noticed it after he got up from bed.  Worse if he presses on the area or tries to lift something on that side.  He did go to work today.  Did not notice any worsening of his chest pain with exertion unless he was lifting something on that side.  No cough.  No fevers or chills.  No pleuritic pain or pain with deep inspiration.  Pain did not worsen while he was at work.  Describes as mild pain.      No falls or actual injury to the area.  Describes pain as sharp stabbing pain.  No pressure.  No crushing chest pain.  No nausea vomiting.  No diaphoresis.  He tried Tylenol this morning but did not have any relief.  Tylenol often helps when he has pain.  He also has history of heartburn and thinks this might be that.  He has not been taking anything and just took Nexium right before I called him.  ROS: per HPI  Pertinent PMHx: HTN, TTP, GERD  Exam:  Gen:  Pleasant, relaxed, conversant.  Alert and oriented x4. Respiratory: Good strong voice.  Speaking in full sentences.  No respiratory distress.  No coughing. Psych: Linear and coherent thought process.  Assessment/Plan:  1.  Atypical chest pain:   - sounds most likely to be musculoskeletal chest pain.  Reproducible with pressure or moving his arm. -No pain with exertion or walking around.   Noted chest pressure. 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 -He should take Tylenol and GERD medication to see if this helps.  He is unable to take NSAIDs secondary to TTP diagnosis. -Plan was for him to try this over the weekend.  If it does not worsen or improves he can come in on Monday to be seen in person. - No red flags and due to atypical pain, can wait to be seen in person on Monday.   -Of course if worsens at all he will go by the emergency department. -Patient agrees with plan.  Appreciative of call.  Time spent during visit with patient: 12 minutes

## 2018-08-14 ENCOUNTER — Emergency Department (HOSPITAL_COMMUNITY): Payer: Commercial Managed Care - PPO

## 2018-08-14 ENCOUNTER — Encounter (HOSPITAL_COMMUNITY): Payer: Self-pay | Admitting: Emergency Medicine

## 2018-08-14 ENCOUNTER — Inpatient Hospital Stay (HOSPITAL_COMMUNITY)
Admission: EM | Admit: 2018-08-14 | Discharge: 2018-08-19 | DRG: 547 | Disposition: A | Discharge status: Home / Self Care | Payer: Commercial Managed Care - PPO | Attending: Family Medicine | Admitting: Family Medicine

## 2018-08-14 ENCOUNTER — Other Ambulatory Visit: Payer: Self-pay

## 2018-08-14 ENCOUNTER — Telehealth: Payer: Self-pay | Admitting: Family Medicine

## 2018-08-14 DIAGNOSIS — F17210 Nicotine dependence, cigarettes, uncomplicated: Secondary | ICD-10-CM | POA: Diagnosis present

## 2018-08-14 DIAGNOSIS — Z79899 Other long term (current) drug therapy: Secondary | ICD-10-CM

## 2018-08-14 DIAGNOSIS — M3119 Other thrombotic microangiopathy: Secondary | ICD-10-CM | POA: Diagnosis present

## 2018-08-14 DIAGNOSIS — F1010 Alcohol abuse, uncomplicated: Secondary | ICD-10-CM | POA: Diagnosis present

## 2018-08-14 DIAGNOSIS — M311 Thrombotic microangiopathy: Principal | ICD-10-CM | POA: Diagnosis present

## 2018-08-14 DIAGNOSIS — R040 Epistaxis: Secondary | ICD-10-CM | POA: Diagnosis present

## 2018-08-14 DIAGNOSIS — L299 Pruritus, unspecified: Secondary | ICD-10-CM | POA: Diagnosis not present

## 2018-08-14 DIAGNOSIS — Z886 Allergy status to analgesic agent status: Secondary | ICD-10-CM

## 2018-08-14 DIAGNOSIS — D696 Thrombocytopenia, unspecified: Secondary | ICD-10-CM | POA: Diagnosis not present

## 2018-08-14 DIAGNOSIS — E119 Type 2 diabetes mellitus without complications: Secondary | ICD-10-CM | POA: Diagnosis present

## 2018-08-14 DIAGNOSIS — Z800 Family history of malignant neoplasm of digestive organs: Secondary | ICD-10-CM

## 2018-08-14 DIAGNOSIS — R7989 Other specified abnormal findings of blood chemistry: Secondary | ICD-10-CM

## 2018-08-14 DIAGNOSIS — R0789 Other chest pain: Secondary | ICD-10-CM

## 2018-08-14 DIAGNOSIS — Z8249 Family history of ischemic heart disease and other diseases of the circulatory system: Secondary | ICD-10-CM

## 2018-08-14 DIAGNOSIS — E782 Mixed hyperlipidemia: Secondary | ICD-10-CM | POA: Diagnosis present

## 2018-08-14 DIAGNOSIS — E876 Hypokalemia: Secondary | ICD-10-CM | POA: Diagnosis not present

## 2018-08-14 DIAGNOSIS — Z87442 Personal history of urinary calculi: Secondary | ICD-10-CM

## 2018-08-14 DIAGNOSIS — T829XXA Unspecified complication of cardiac and vascular prosthetic device, implant and graft, initial encounter: Secondary | ICD-10-CM

## 2018-08-14 DIAGNOSIS — Z8349 Family history of other endocrine, nutritional and metabolic diseases: Secondary | ICD-10-CM

## 2018-08-14 DIAGNOSIS — Z7984 Long term (current) use of oral hypoglycemic drugs: Secondary | ICD-10-CM

## 2018-08-14 DIAGNOSIS — R079 Chest pain, unspecified: Secondary | ICD-10-CM | POA: Diagnosis not present

## 2018-08-14 DIAGNOSIS — I10 Essential (primary) hypertension: Secondary | ICD-10-CM | POA: Diagnosis present

## 2018-08-14 DIAGNOSIS — K219 Gastro-esophageal reflux disease without esophagitis: Secondary | ICD-10-CM | POA: Diagnosis present

## 2018-08-14 DIAGNOSIS — R778 Other specified abnormalities of plasma proteins: Secondary | ICD-10-CM

## 2018-08-14 DIAGNOSIS — D539 Nutritional anemia, unspecified: Secondary | ICD-10-CM | POA: Diagnosis not present

## 2018-08-14 HISTORY — DX: Other chest pain: R07.89

## 2018-08-14 LAB — TROPONIN I
Troponin I: 0.03 ng/mL (ref <0.03)
Troponin I: <0.03 ng/mL (ref <0.03)

## 2018-08-14 LAB — CBC WITH DIFFERENTIAL/PLATELET
Abs Immature Granulocytes: 0.04 10*3/uL (ref 0.00–0.07)
Basophils Absolute: 0 10*3/uL (ref 0.0–0.1)
Basophils Relative: 0 %
Eosinophils Absolute: 0.4 10*3/uL (ref 0.0–0.5)
Eosinophils Relative: 3 %
HCT: 30.7 % — ABNORMAL LOW (ref 39.0–52.0)
Hemoglobin: 10.1 g/dL — ABNORMAL LOW (ref 13.0–17.0)
Immature Granulocytes: 0 %
Lymphocytes Relative: 42 %
Lymphs Abs: 4.4 10*3/uL — ABNORMAL HIGH (ref 0.7–4.0)
MCH: 34.9 pg — ABNORMAL HIGH (ref 26.0–34.0)
MCHC: 32.9 g/dL (ref 30.0–36.0)
MCV: 106.2 fL — ABNORMAL HIGH (ref 80.0–100.0)
Monocytes Absolute: 0.7 10*3/uL (ref 0.1–1.0)
Monocytes Relative: 7 %
Neutro Abs: 5 10*3/uL (ref 1.7–7.7)
Neutrophils Relative %: 48 %
Platelets: 38 10*3/uL — ABNORMAL LOW (ref 150–400)
RBC: 2.89 MIL/uL — ABNORMAL LOW (ref 4.22–5.81)
RDW: 19.9 % — ABNORMAL HIGH (ref 11.5–15.5)
WBC: 10.6 10*3/uL — ABNORMAL HIGH (ref 4.0–10.5)
nRBC: 0.4 % — ABNORMAL HIGH (ref 0.0–0.2)

## 2018-08-14 LAB — COMPREHENSIVE METABOLIC PANEL
ALT: 65 U/L — ABNORMAL HIGH (ref 0–44)
AST: 61 U/L — ABNORMAL HIGH (ref 15–41)
Albumin: 4.1 g/dL (ref 3.5–5.0)
Alkaline Phosphatase: 104 U/L (ref 38–126)
Anion gap: 11 (ref 5–15)
BUN: 13 mg/dL (ref 6–20)
CO2: 25 mmol/L (ref 22–32)
Calcium: 8.9 mg/dL (ref 8.9–10.3)
Chloride: 100 mmol/L (ref 98–111)
Creatinine, Ser: 0.94 mg/dL (ref 0.61–1.24)
GFR calc Af Amer: >60 mL/min (ref >60)
GFR calc non Af Amer: >60 mL/min (ref >60)
Glucose, Bld: 110 mg/dL — ABNORMAL HIGH (ref 70–99)
Potassium: 2.9 mmol/L — ABNORMAL LOW (ref 3.5–5.1)
Sodium: 136 mmol/L (ref 135–145)
Total Bilirubin: 3.6 mg/dL — ABNORMAL HIGH (ref 0.3–1.2)
Total Protein: 6.8 g/dL (ref 6.5–8.1)

## 2018-08-14 LAB — BILIRUBIN, DIRECT: Bilirubin, Direct: 0.4 mg/dL — ABNORMAL HIGH (ref 0.0–0.2)

## 2018-08-14 LAB — LIPASE, BLOOD: Lipase: 63 U/L — ABNORMAL HIGH (ref 11–51)

## 2018-08-14 MED ORDER — POTASSIUM CHLORIDE CRYS ER 20 MEQ PO TBCR
40.0000 meq | EXTENDED_RELEASE_TABLET | Freq: Once | ORAL | Status: AC
  Administered 2018-08-14: 23:00:00 40 meq via ORAL
  Filled 2018-08-14: qty 2

## 2018-08-14 NOTE — Telephone Encounter (Signed)
**  After Hours/ Emergency Line Call**  Received a call to report that Eric Hooper went to work on Monday and started to have some popping in his ears.  Stated that this got better but then on Thursday he had an episode of epistaxis.  States that his nose started bleeding after he blew his nose and easily stopped bleeding within 10 minutes after holding pressure.  Patient states that he then had another episode of epistaxis yesterday where his nose simply started bleeding on its own, however it did stop after <10 minutes of holding pressure.  Reports that he has a history of seasonal allergies with which he previously has required shots.  States that he has not been taking any medication for his allergies recently.  States that he picked Mucinex up from the store in order to help.  Advised him that he should try something such as Claritin, Zyrtec or Allegra for his allergies rather than Mucinex.  Told him to avoid placing anything inside of his nose such as Flonase given his recent nosebleeds.  Patient advised to return to the ED if he cannot get his nosebleeds to resolve after 15 minutes of holding pressure.  Reassured patient that his symptoms do not sound consistent with COVID-19 as he is afebrile and not experiencing any shortness of breath or other URI symptoms such as cough.  Red flags discussed.  Will forward to PCP.  [place] Anthonette Lesage, DO PGY-2, [facility] 08/14/2018 4:48 PM

## 2018-08-14 NOTE — ED Notes (Signed)
Nurse will draw labs. 

## 2018-08-14 NOTE — H&P (Addendum)
[facility] Admission History and Physical Service Pager: [phone]  Patient name: Eric Hooper Medical record number: [id number] Date of birth: 03/02/1976 Age: 43 y.o. Gender: male  Primary Care Provider: Bonnita Hollow, MD Consultants: heme-onc Code Status: Full  Chief Complaint: dull chest pain and nose bleeds  Assessment and Plan: Eric Hooper is a 42 y.o. male presenting with dull chest pain and nosebleeds for 4 days.  His past medical history significant for TTP (2018), alcohol use disorder, DMT2, HTN, HLD, GERD.  Thrombocytopenia, history of moderate thrombocytopenic purpura concern for recurrence Eric Hooper reports about 4 days of dull, burning epigastric pain without radiation to left shoulder.  He also noted epistaxis on 4/16 and 4/19.  He reports this pain is similar to his previous hospitalization for TTP in October 2018.  Physical exam is remarkable for scleral icterus and mild global jaundice, no bruising/ecchymoses noted.  Initial vitals were notable for blood pressure up to 182/106.  Admission labs are notable for potassium 2.9, lipase 63, AST 61, ALT 65, T bili 3.6, WBC 10.6, hemoglobin 10.1, RDW 19.4, platelets 38.  Prior to today, this most recent labs are from 03/2018.  At that time, his labs showed Hgb 16.0, platelets 211, T bili 1.1.  Given his medical history of TTP in 2018, this is very concerning for a recurrence of TTP.  Heme/onc was contacted and made laboratory recommendations noted that they would see him tonight. -Admit to [facility], attending Dr. Ardelia Mems -Hematology/oncology following, appreciate recommendations- see their note for full recs -Follow-up direct bili, LDH, haptoglobin, [name], d-dimer, for prevention, INR, APTT, peripheral blood smear -SCDs (thrombocytopenic, no anticoagulation)  ACS rule out He reports dull, burning chest pain starting in the center of his chest with some radiation to the left shoulder.  Chest  pain is not reproducible on exam, worsens with exertion, improves with rest and Tylenol.  Physical exam showed no evidence of volume overload, no reproducible chest pain, no murmurs.  Vitals are remarkable elevated blood pressure up so 182/106.  EKG showed no inverted T waves, ST changes or new Q waves.  Initial troponin was elevated to 0.03.  Risk factors for acute coronary syndrome include hypertension, diabetes, smoking.  Heart score 4.  Per patient report, this chest pain feels similar to his previous admission for TTP.  TTP will be evaluated as noted above, given his risk factors for ACS we will continue with ACS rule out.   -Telemetry overnight -Trend troponins -A.m. EKG -Follow-up echo -Consider cardiology consult -Monitor vitals  Diabetes, type II, stable His history of type 2 diabetes.  Last A1c on 06/2018 was 6.6.  Blood glucose on admission was 110.  Home medication includes metformin 1000 mg daily.  No sign of kidney injury on admission. -hold home metformin -SSI given patient will likely need steroids this admission for TTP  Hypertension Is a history of hypertension and takes amlodipine 10 mg daily at home.  Blood pressures on admission were as high as 182/106.  Since initial elevated BP, blood pressures have decreased to 147/97. -Continue amlodipine 10 mg daily  GERD He reports that his current chest pain feels different from his common symptoms of GERD.  He reports that his current chest symptoms were not alleviated by taking his Nexium.  Home medications include Nexium 40 mg twice daily. -Pantoprazole 80 mg daily  Nicotine use disorder He reports smoking about half a pack of cigarettes per day.  He requested a nicotine patch during his hospitalization. -  NicoDerm patch 14 mg daily  EtOH use disorder He reports drinking at least 6 beers daily, often more.  He denies ever having experienced withdrawal. -Monitor CIWA scores every 4 hours -No Ativan scheduled at  present  FEN/GI: General diet Prophylaxis: SCDs (thrombocytopenia)  Disposition: Will likely require hospitalization for 3 to 4 days if heme/onc finds this to be convincing case of TTP.  History of Present Illness:  Eric Hooper is a 43 y.o. male presenting with dull chest pain and nosebleeds for 4 days.  His past medical history significant for TTP (2018), alcohol use disorder, DMT2, HTN, HLD, GERD.  He reports that he first started to experience chest pain on 4/16.  The chest pain was described as a dull burning sensation in the middle of his chest with mild radiation to his left shoulder joint.  The pain was 6/10 at its worst.  He noticed that the pain was worsened with exertion/working and improved with rest/lying down.  He also noted that the pain seemed to be improved by Tylenol.  He did try taking his PPI which did not seem to alleviate his pain.  This dull chest discomfort persisted through 4/17 and then appeared to improve and go away on 4/18.  During the day on 4/19, the chest pain started again.  With the recurrence of this chest pain, Eric Hooper thought that he should be evaluated in the hospital.  At the time of the interview, his chest pain was 2/6.  He also noted that he had a nosebleed on 4/16 and 4/19.  This is unusual, he does not get frequent nosebleeds.  He has a history of thrombotic thrombocytopenic purpura requiring hospitalization in 2018 with plasmapheresis.  He recalls that the chest pain he is experiencing now is similar to his symptoms during that previous hospitalization for TTP.  In the ED, out of concern for chest pain, EKG showed no changes from previous and initial troponin was 0.03.  His heart score was found to be 4.  He was admitted at that time for ACS rule out.  Review Of Systems: Per HPI with the following additions:   Review of Systems  Constitutional: Negative for chills, fever and malaise/fatigue.  HENT: Positive for nosebleeds. Negative for  congestion, hearing loss and sore throat.   Eyes: Negative for blurred vision.  Respiratory: Positive for cough (chronic dry for years). Negative for shortness of breath and wheezing.   Cardiovascular: Positive for chest pain. Negative for palpitations, orthopnea and leg swelling.  Gastrointestinal: Positive for heartburn. Negative for abdominal pain, constipation, diarrhea, nausea and vomiting.  Genitourinary: Negative for dysuria.  Musculoskeletal: Negative for joint pain and myalgias.  Neurological: Negative for tremors, weakness and headaches.  Endo/Heme/Allergies: Does not bruise/bleed easily.    Patient Active Problem List   Diagnosis Date Noted  . Chest pain 08/14/2018  . At risk for dysfunction of heart 07/11/2018  . Mixed hyperlipidemia 02/28/2018  . Uncontrolled diabetes mellitus ([place]) 02/25/2018  . Phlebitis 05/12/2017  . Leg swelling 05/12/2017  . Tobacco use disorder 04/13/2017  . GERD (gastroesophageal reflux disease)   . Hypertension   . TTP (thrombotic thrombocytopenic purpura) ([place]) 02/06/2017  . Cough 02/05/2017  . Alcohol use 02/03/2017  . Renal insufficiency 02/03/2017  . Leukocytosis 02/03/2017  . Thrombocytopenia ([place]) 02/01/2017  . Hypokalemia 02/01/2017  . Macrocytic anemia 02/01/2017  . Hyperglycemia 02/01/2017  . Microscopic hematuria 02/01/2017  . Diverticulosis 02/01/2017    Past Medical History: Past Medical History:  Diagnosis Date  .  Controlled diabetes mellitus type 2 with complications ([place]) 71/09/9676  . Diabetes mellitus without complication (HCC)    non-insulin dependent  . Diverticulosis 02/01/2017   on CT scan abd/pelvis  . GERD (gastroesophageal reflux disease)   . Hypertension   . Kidney stones 02/03/2017  . Mixed hyperlipidemia 02/28/2018  . Smoker 04/13/2017    Past Surgical History: Past Surgical History:  Procedure Laterality Date  . ELBOW SURGERY Right   . IR FLUORO GUIDE CV LINE RIGHT  02/02/2017  . IR US GUIDE VASC  ACCESS RIGHT  02/02/2017    Social History: Social History   Tobacco Use  . Smoking status: Current Every Day Smoker    Packs/day: 0.50    Years: 15.00    Pack years: 7.50    Types: Cigarettes  . Smokeless tobacco: Never Used  Substance Use Topics  . Alcohol use: Yes    Comment: 3-4 beers per day most days  . Drug use: No   Additional social history: lives at home with fiance and [place] (45 years old), two other sons do not live with him (60 and 49 years old) Please also refer to relevant sections of EMR.  Family History: Family History  Problem Relation Age of Onset  . Hypertension Mother   . Hyperlipidemia Mother   . Hypertension Father   . Colon cancer Maternal Aunt     Allergies and Medications: Allergies  Allergen Reactions  . Ibuprofen Other (See Comments)    Drops platelets/ thrombocytopenia   Current Facility-Administered Medications on File Prior to Encounter  Medication Dose Route Frequency Provider Last Rate Last Dose  . 0.9 %  sodium chloride infusion  500 mL Intravenous Once Doran Stabler, MD       Current Outpatient Medications on File Prior to Encounter  Medication Sig Dispense Refill  . acetaminophen (TYLENOL) 500 MG tablet Take 2,000 mg by mouth daily as needed (pain).    Marland Kitchen amLODipine (NORVASC) 10 MG tablet Take 1 tablet (10 mg total) by mouth daily. 30 tablet 5  . atorvastatin (LIPITOR) 40 MG tablet TAKE 1 TABLET BY MOUTH EVERY DAY (Patient taking differently: Take 40 mg by mouth daily. ) 30 tablet 1  . esomeprazole (NEXIUM) 40 MG capsule Take 1 capsule (40 mg total) by mouth 2 (two) times daily before a meal. 90 capsule 1  . metFORMIN (GLUCOPHAGE) 1000 MG tablet Take 1 tablet (1,000 mg total) by mouth daily with breakfast. 30 tablet 1  . benzonatate (TESSALON) 100 MG capsule Take 1 capsule (100 mg total) by mouth 2 (two) times daily as needed for cough. (Patient not taking: Reported on 08/14/2018) 20 capsule 0  . blood glucose meter kit and supplies  KIT Dispense based on patient and insurance preference. Use up to four times daily as directed. (FOR ICD-9 250.00, 250.01). 1 each 0    Objective: BP (!) 141/89   Pulse 65   Temp 99.3 F (37.4 C) (Oral)   Resp 19   Ht _0  (1.778 m)   Wt 92.1 kg   SpO2 98%   BMI 29.13 kg/m   Physical Exam Constitutional:      General: He is not in acute distress.    Appearance: He is well-developed. He is obese.     Comments: Mild global jaundice  Eyes:     Extraocular Movements: Extraocular movements intact.     Pupils: Pupils are equal, round, and reactive to light.     Comments: Scleral icterus  Neck:     Vascular: No JVD.  Cardiovascular:     Rate and Rhythm: Normal rate and regular rhythm.     Pulses:          Radial pulses are 2+ on the right side.     Heart sounds: Normal heart sounds. No murmur.  Pulmonary:     Effort: Pulmonary effort is normal. No respiratory distress.     Breath sounds: Normal breath sounds.  Chest:     Chest wall: No mass, deformity, tenderness or edema.  Abdominal:     General: Bowel sounds are normal.     Palpations: Hepatomegaly: unable to palpate liver/spleen.     Tenderness: There is no abdominal tenderness.     Comments: Mildly distended and mildly firm.  Musculoskeletal:     Right lower leg: He exhibits no tenderness. No edema.     Left lower leg: He exhibits no tenderness. No edema.  Lymphadenopathy:     Cervical: No cervical adenopathy.  Skin:    Findings: No ecchymosis.     Comments: No apparent bruising/ecchymoses/purpura  Neurological:     General: No focal deficit present.     Mental Status: He is alert and oriented to person, place, and time.  Psychiatric:        Mood and Affect: Mood normal.        Behavior: Behavior normal.       Labs and Imaging: CBC BMET  Recent Labs  Lab 08/14/18 2052  WBC 10.6*  HGB 10.1*  HCT 30.7*  PLT 38*   Recent Labs  Lab 08/14/18 2052  NA 136  K 2.9*  CL 100  CO2 25  BUN 13   CREATININE 0.94  GLUCOSE 110*  CALCIUM 8.9     Dg Chest Portable 1 View  Result Date: 08/14/2018 CLINICAL DATA:  Chest pain EXAM: PORTABLE CHEST 1 VIEW COMPARISON:  12/21/2017 FINDINGS: Heart and mediastinal contours are within normal limits. No focal opacities or effusions. No acute bony abnormality. IMPRESSION: No active disease. Electronically Signed   By: Rolm Baptise M.D.   On: 08/14/2018 20:32     Matilde Haymaker, MD 08/14/2018, 11:04 PM PGY-1, [facility] pager: [phone], text pages welcome   FPTS Upper-Level Resident Addendum   I have independently interviewed and examined the patient. I have discussed the above with the original author and agree with their documentation. My edits for correction/addition/clarification are in pink. Please see also any attending notes.    Lucila Maine, DO PGY-3, [facility] pager: [phone] (text pages welcome through [facility])

## 2018-08-14 NOTE — ED Triage Notes (Signed)
Pt from home c/o central chest Chest pain starting Thursday intermittently. Worsening today as he was walking outside. Pain at 6 on scale 0-10. Pt. Has hx of "really bad allergies." HX DM, and HTN. Pt. Denies fever. Pt denies NV.

## 2018-08-14 NOTE — ED Provider Notes (Signed)
[facility] EMERGENCY DEPARTMENT Provider Note   CSN: [id number] Arrival date & time: 08/14/18  1936    History   Chief Complaint No chief complaint on file.   HPI Eric Hooper is a 43 y.o. male.     HPI Patient here with 3 days of middle CP.  Telemedicine saw him 2 days ago for similar and stated he likely had muskuloskeletal CP.  Not reproducible with palpation.  Recently restarted heavy lifting while at work. Worsened with exertion. Better with rest.  No radiation. Past Medical History:  Diagnosis Date  . Controlled diabetes mellitus type 2 with complications ([place]) 63/11/1769  . Diabetes mellitus without complication (HCC)    non-insulin dependent  . Diverticulosis 02/01/2017   on CT scan abd/pelvis  . GERD (gastroesophageal reflux disease)   . Hypertension   . Kidney stones 02/03/2017  . Mixed hyperlipidemia 02/28/2018  . Smoker 04/13/2017    Patient Active Problem List   Diagnosis Date Noted  . At risk for dysfunction of heart 07/11/2018  . Mixed hyperlipidemia 02/28/2018  . Uncontrolled diabetes mellitus ([place]) 02/25/2018  . Phlebitis 05/12/2017  . Leg swelling 05/12/2017  . Tobacco use disorder 04/13/2017  . GERD (gastroesophageal reflux disease)   . Hypertension   . TTP (thrombotic thrombocytopenic purpura) ([place]) 02/06/2017  . Cough 02/05/2017  . Alcohol use 02/03/2017  . Renal insufficiency 02/03/2017  . Leukocytosis 02/03/2017  . Thrombocytopenia ([place]) 02/01/2017  . Hypokalemia 02/01/2017  . Macrocytic anemia 02/01/2017  . Hyperglycemia 02/01/2017  . Microscopic hematuria 02/01/2017  . Diverticulosis 02/01/2017    Past Surgical History:  Procedure Laterality Date  . ELBOW SURGERY Right   . IR FLUORO GUIDE CV LINE RIGHT  02/02/2017  . IR US GUIDE VASC ACCESS RIGHT  02/02/2017        Home Medications    Prior to Admission medications   Medication Sig Start Date End Date Taking? Authorizing Provider  amLODipine (NORVASC) 10  MG tablet Take 1 tablet (10 mg total) by mouth daily. 07/12/18   Bonnita Hollow, MD  atorvastatin (LIPITOR) 40 MG tablet TAKE 1 TABLET BY MOUTH EVERY DAY 08/08/18   Bonnita Hollow, MD  benzonatate (TESSALON) 100 MG capsule Take 1 capsule (100 mg total) by mouth 2 (two) times daily as needed for cough. 07/12/18   Bonnita Hollow, MD  blood glucose meter kit and supplies KIT Dispense based on patient and insurance preference. Use up to four times daily as directed. (FOR ICD-9 250.00, 250.01). 03/05/17   Horald Pollen, MD  esomeprazole (NEXIUM) 40 MG capsule Take 1 capsule (40 mg total) by mouth 2 (two) times daily before a meal. 07/11/18   Bonnita Hollow, MD  metFORMIN (GLUCOPHAGE) 1000 MG tablet Take 1 tablet (1,000 mg total) by mouth daily with breakfast. 07/21/17   Girtha Rm, NP-C    Family History Family History  Problem Relation Age of Onset  . Hypertension Mother   . Hyperlipidemia Mother   . Hypertension Father   . Colon cancer Maternal Aunt     Social History Social History   Tobacco Use  . Smoking status: Current Every Day Smoker    Packs/day: 0.50    Years: 15.00    Pack years: 7.50    Types: Cigarettes  . Smokeless tobacco: Never Used  Substance Use Topics  . Alcohol use: Yes    Comment: 3-4 beers per day most days  . Drug use: No     Allergies  Ibuprofen   Review of Systems Review of Systems  Constitutional: Negative for chills and fever.  HENT: Negative for ear pain and sore throat.   Eyes: Negative for pain and visual disturbance.  Respiratory: Negative for cough and shortness of breath.   Cardiovascular: Positive for chest pain. Negative for palpitations.  Gastrointestinal: Negative for abdominal pain and vomiting.  Genitourinary: Negative for dysuria and hematuria.  Musculoskeletal: Negative for arthralgias and back pain.  Skin: Negative for color change and rash.  Neurological: Negative for seizures, syncope, numbness and headaches.   All other systems reviewed and are negative.    Physical Exam Updated Vital Signs There were no vitals taken for this visit.  Physical Exam Vitals signs and nursing note reviewed.  Constitutional:      Appearance: He is well-developed. He is not ill-appearing or diaphoretic.  HENT:     Head: Normocephalic and atraumatic.  Eyes:     Extraocular Movements: Extraocular movements intact.     Conjunctiva/sclera: Conjunctivae normal.     Pupils: Pupils are equal, round, and reactive to light.  Neck:     Musculoskeletal: Neck supple.  Cardiovascular:     Rate and Rhythm: Normal rate and regular rhythm.     Heart sounds: No murmur.  Pulmonary:     Effort: Pulmonary effort is normal. No respiratory distress.     Breath sounds: Normal breath sounds.  Abdominal:     Palpations: Abdomen is soft.     Tenderness: There is no abdominal tenderness.  Musculoskeletal:     Right lower leg: He exhibits no tenderness. No edema.     Left lower leg: He exhibits no tenderness. No edema.  Skin:    General: Skin is warm and dry.  Neurological:     General: No focal deficit present.     Mental Status: He is alert and oriented to person, place, and time.     Cranial Nerves: No cranial nerve deficit.     Motor: No weakness.      ED Treatments / Results  Labs (all labs ordered are listed, but only abnormal results are displayed) Labs Reviewed - No data to display  EKG None  Radiology No results found.  Procedures Procedures (including critical care time)  Medications Ordered in ED Medications - No data to display   Initial Impression / Assessment and Plan / ED Course  I have reviewed the triage vital signs and the nursing notes.  Pertinent labs & imaging results that were available during my care of the patient were reviewed by me and considered in my medical decision making (see chart for details).         HDS. Chest pain as noted above. Asymptomatic at this time.  Self  resolved. PERC negative.  Lab work shows macrocytic anemia that the patient denies any recurrent alcohol use.  Potassium of 2.9 - chronic when comparing ot previous labs.  Replaced in the ED.  Slight elevation of AST and ALT as well as bilirubin.  Pending direct bilirubin to quantify if this is mixed hyperbilirubinemia.  Denies any pain at this time.  Likely needs right upper quadrant ultrasound.  Troponin is detectable at 0.03 today.  Patient needs to be admitted for further management but at this time given that he does not have chest pain or EKG changes I do not believe he needs an emergent cardiac consultation as I do not believe he is currently having ACS.  Patient agreeable to being admitted to the hospital.  Final Clinical Impressions(s) / ED Diagnoses   Final diagnoses:  Chest wall pain  Hyperbilirubinemia  Elevated troponin    ED Discharge Orders    None       Andee Poles, MD 08/14/18 2957    Elnora Morrison, MD 08/16/18 [phone]

## 2018-08-14 NOTE — ED Notes (Signed)
Admitting md at bedside

## 2018-08-14 NOTE — ED Notes (Signed)
ED TO INPATIENT HANDOFF REPORT  ED Nurse Name and Phone #: Glenn Christo "[email or web address]" [phone]  S Name/Age/Gender Ilhan Remmel 43 y.o. male Room/Bed: 023C/023C  Code Status   Code Status: Prior  Home/SNF/Other Home Patient oriented to: self, place, time and situation Is this baseline? Yes   Triage Complete: Triage complete  Chief Complaint Chest Pain  Triage Note Pt from home c/o central chest Chest pain starting Thursday intermittently. Worsening today as he was walking outside. Pain at 6 on scale 0-10. Pt. Has hx of "really bad allergies." HX DM, and HTN. Pt. Denies fever. Pt denies NV.    Allergies Allergies  Allergen Reactions  . Ibuprofen Other (See Comments)    Drops platelets/ thrombocytopenia    Level of Care/Admitting Diagnosis ED Disposition    ED Disposition Condition Comment   Admit  Hospital Area: [facility] [[id number]]  Level of Care: Telemetry Medical [104]  Covid Evaluation: N/A  Diagnosis: Chest pain [361443]  Admitting Physician: Matilde Haymaker [[id number]]  Attending Physician: Leeanne Rio [[id number]]  PT Class (Do Not Modify): Observation [104]  PT Acc Code (Do Not Modify): Observation [10022]       B Medical/Surgery History Past Medical History:  Diagnosis Date  . Controlled diabetes mellitus type 2 with complications ([facility]) 76/04/9507  . Diabetes mellitus without complication (H[facility])    non-insulin dependent  . Diverticulosis 02/01/2017   on CT scan abd/pelvis  . GERD (gastroesophageal reflux disease)   . Hypertension   . Kidney stones 02/03/2017  . Mixed hyperlipidemia 02/28/2018  . Smoker 04/13/2017   Past Surgical History:  Procedure Laterality Date  . ELBOW SURGERY Right   . IR FLUORO GUIDE CV LINE RIGHT  02/02/2017  . IR US GUIDE VASC ACCESS RIGHT  02/02/2017     A IV Location/Drains/Wounds Patient Lines/Drains/Airways Status   Active Line/Drains/Airways    Name:   Placement date:   Placement time:   Site:   Days:   Peripheral IV 08/14/18 Right Antecubital   08/14/18    2315    Antecubital   less than 1          Intake/Output Last 24 hours No intake or output data in the 24 hours ending 08/14/18 2321  Labs/Imaging Results for orders placed or performed during the hospital encounter of 08/14/18 (from the past 48 hour(s))  CBC with Differential     Status: Abnormal   Collection Time: 08/14/18  8:52 PM  Result Value Ref Range   WBC 10.6 (H) 4.0 - 10.5 K/uL   RBC 2.89 (L) 4.22 - 5.81 MIL/uL   Hemoglobin 10.1 (L) 13.0 - 17.0 g/dL   HCT 30.7 (L) 39.0 - 52.0 %   MCV 106.2 (H) 80.0 - 100.0 fL   MCH 34.9 (H) 26.0 - 34.0 pg   MCHC 32.9 30.0 - 36.0 g/dL   RDW 19.9 (H) 11.5 - 15.5 %   Platelets 38 (L) 150 - 400 K/uL    Comment: REPEATED TO VERIFY PLATELET COUNT CONFIRMED BY SMEAR SPECIMEN CHECKED FOR CLOTS Immature Platelet Fraction may be clinically indicated, consider ordering this additional test [id number]    nRBC 0.4 (H) 0.0 - 0.2 %   Neutrophils Relative % 48 %   Neutro Abs 5.0 1.7 - 7.7 K/uL   Lymphocytes Relative 42 %   Lymphs Abs 4.4 (H) 0.7 - 4.0 K/uL   Monocytes Relative 7 %   Monocytes Absolute 0.7 0.1 - 1.0 K/uL   Eosinophils Relative 3 %  Eosinophils Absolute 0.4 0.0 - 0.5 K/uL   Basophils Relative 0 %   Basophils Absolute 0.0 0.0 - 0.1 K/uL   Immature Granulocytes 0 %   Abs Immature Granulocytes 0.04 0.00 - 0.07 K/uL    Comment: Performed at [facility], [place] [address], [place], [place] [address]  Comprehensive metabolic panel     Status: Abnormal   Collection Time: 08/14/18  8:52 PM  Result Value Ref Range   Sodium 136 135 - 145 mmol/L   Potassium 2.9 (L) 3.5 - 5.1 mmol/L   Chloride 100 98 - 111 mmol/L   CO2 25 22 - 32 mmol/L   Glucose, Bld 110 (H) 70 - 99 mg/dL   BUN 13 6 - 20 mg/dL   Creatinine, Ser 0.94 0.61 - 1.24 mg/dL   Calcium 8.9 8.9 - 10.3 mg/dL   Total Protein 6.8 6.5 - 8.1 g/dL   Albumin 4.1 3.5 - 5.0 g/dL   AST 61 (H) 15 - 41 U/L   ALT 65 (H) 0 -  44 U/L   Alkaline Phosphatase 104 38 - 126 U/L   Total Bilirubin 3.6 (H) 0.3 - 1.2 mg/dL   GFR calc non Af Amer >60 >60 mL/min   GFR calc Af Amer >60 >60 mL/min   Anion gap 11 5 - 15    Comment: Performed at [facility] [address] [place], [place] [address]  Troponin I - Now Then Q3H     Status: Abnormal   Collection Time: 08/14/18  8:52 PM  Result Value Ref Range   Troponin I 0.03 (HH) <0.03 ng/mL    Comment: CRITICAL RESULT CALLED TO, READ BACK BY AND VERIFIED WITH: OFORIO,B RN 08/14/2018 2158 JORDANS Performed at [facility], [place] [address] [place], [place] [address]   Lipase, blood     Status: Abnormal   Collection Time: 08/14/18  8:52 PM  Result Value Ref Range   Lipase 63 (H) 11 - 51 U/L    Comment: Performed at [facility] [address] [place], [place] [address]   Dg Chest Portable 1 View  Result Date: 08/14/2018 CLINICAL DATA:  Chest pain EXAM: PORTABLE CHEST 1 VIEW COMPARISON:  12/21/2017 FINDINGS: Heart and mediastinal contours are within normal limits. No focal opacities or effusions. No acute bony abnormality. IMPRESSION: No active disease. Electronically Signed   By: Rolm Baptise M.D.   On: 08/14/2018 20:32    Pending Labs Unresulted Labs (From admission, onward)    Start     Ordered   08/14/18 2317  Protime-INR  Once,   R     08/14/18 2317   08/14/18 2317  APTT  Once,   R     08/14/18 2317   08/14/18 2315  Direct antiglobulin test (not at [facility])  Once,   R     08/14/18 2314   08/14/18 2313  Haptoglobin  Add-on,   R     08/14/18 2312   08/14/18 2313  Save Smear  Once,   R     08/14/18 2312   08/14/18 2313  Pathologist smear review  Once,   R     08/14/18 2312   08/14/18 2312  Lactate dehydrogenase  Add-on,   R     08/14/18 2312   08/14/18 2218  Bilirubin, direct  ONCE - STAT,   STAT     08/14/18 2217   08/14/18 2019  Troponin I - Now Then Q3H  Now  then every 3 hours,   R     08/14/18 2018          Vitals/Pain Today's  Vitals   08/14/18 2130 08/14/18 2145 08/14/18 2230 08/14/18 2245  BP: (!) 131/91 (!) 143/86 (!) 143/104 (!) 141/89  Pulse: 69 74 72 65  Resp: (!) 24 (!) 22 17 19   Temp:      TempSrc:      SpO2: 98% 98% 97% 98%  Weight:      Height:      PainSc:        Isolation Precautions No active isolations  Medications Medications  potassium chloride SA (K-DUR) CR tablet 40 mEq (40 mEq Oral Given 08/14/18 2253)    Mobility walks Low fall risk   Focused Assessments Cardiac Assessment Handoff:  Cardiac Rhythm: Normal sinus rhythm Lab Results  Component Value Date   CKTOTAL 228 04/12/2018   TROPONINI 0.03 (HH) 08/14/2018   Lab Results  Component Value Date   DDIMER 0.31 12/21/2017   Does the Patient currently have chest pain? Yes     R Recommendations: See Admitting Provider Note  Report given to:   Additional Notes:

## 2018-08-15 ENCOUNTER — Observation Stay (HOSPITAL_BASED_OUTPATIENT_CLINIC_OR_DEPARTMENT_OTHER): Payer: Commercial Managed Care - PPO

## 2018-08-15 ENCOUNTER — Observation Stay (HOSPITAL_COMMUNITY): Payer: Commercial Managed Care - PPO

## 2018-08-15 ENCOUNTER — Other Ambulatory Visit: Payer: Self-pay

## 2018-08-15 ENCOUNTER — Encounter (HOSPITAL_COMMUNITY): Payer: Self-pay

## 2018-08-15 DIAGNOSIS — E119 Type 2 diabetes mellitus without complications: Secondary | ICD-10-CM | POA: Diagnosis not present

## 2018-08-15 DIAGNOSIS — E876 Hypokalemia: Secondary | ICD-10-CM | POA: Diagnosis not present

## 2018-08-15 DIAGNOSIS — Z8349 Family history of other endocrine, nutritional and metabolic diseases: Secondary | ICD-10-CM | POA: Diagnosis not present

## 2018-08-15 DIAGNOSIS — K219 Gastro-esophageal reflux disease without esophagitis: Secondary | ICD-10-CM | POA: Diagnosis not present

## 2018-08-15 DIAGNOSIS — Z87442 Personal history of urinary calculi: Secondary | ICD-10-CM | POA: Diagnosis not present

## 2018-08-15 DIAGNOSIS — E782 Mixed hyperlipidemia: Secondary | ICD-10-CM | POA: Diagnosis not present

## 2018-08-15 DIAGNOSIS — I10 Essential (primary) hypertension: Secondary | ICD-10-CM | POA: Diagnosis not present

## 2018-08-15 DIAGNOSIS — Z8249 Family history of ischemic heart disease and other diseases of the circulatory system: Secondary | ICD-10-CM | POA: Diagnosis not present

## 2018-08-15 DIAGNOSIS — Z886 Allergy status to analgesic agent status: Secondary | ICD-10-CM | POA: Diagnosis not present

## 2018-08-15 DIAGNOSIS — R079 Chest pain, unspecified: Secondary | ICD-10-CM

## 2018-08-15 DIAGNOSIS — Z800 Family history of malignant neoplasm of digestive organs: Secondary | ICD-10-CM | POA: Diagnosis not present

## 2018-08-15 DIAGNOSIS — R040 Epistaxis: Secondary | ICD-10-CM | POA: Diagnosis not present

## 2018-08-15 DIAGNOSIS — R7989 Other specified abnormal findings of blood chemistry: Secondary | ICD-10-CM | POA: Diagnosis not present

## 2018-08-15 DIAGNOSIS — F1010 Alcohol abuse, uncomplicated: Secondary | ICD-10-CM | POA: Diagnosis not present

## 2018-08-15 DIAGNOSIS — R0789 Other chest pain: Secondary | ICD-10-CM | POA: Diagnosis not present

## 2018-08-15 DIAGNOSIS — L299 Pruritus, unspecified: Secondary | ICD-10-CM | POA: Diagnosis not present

## 2018-08-15 DIAGNOSIS — Z7984 Long term (current) use of oral hypoglycemic drugs: Secondary | ICD-10-CM | POA: Diagnosis not present

## 2018-08-15 DIAGNOSIS — Z79899 Other long term (current) drug therapy: Secondary | ICD-10-CM | POA: Diagnosis not present

## 2018-08-15 DIAGNOSIS — Z452 Encounter for adjustment and management of vascular access device: Secondary | ICD-10-CM | POA: Diagnosis not present

## 2018-08-15 DIAGNOSIS — M311 Thrombotic microangiopathy: Secondary | ICD-10-CM | POA: Diagnosis not present

## 2018-08-15 DIAGNOSIS — F17210 Nicotine dependence, cigarettes, uncomplicated: Secondary | ICD-10-CM | POA: Diagnosis not present

## 2018-08-15 LAB — DIRECT ANTIGLOBULIN TEST (NOT AT ARMC)
DAT, IgG: NEGATIVE
DAT, complement: NEGATIVE

## 2018-08-15 LAB — BASIC METABOLIC PANEL
Anion gap: 8 (ref 5–15)
BUN: 13 mg/dL (ref 6–20)
CO2: 24 mmol/L (ref 22–32)
Calcium: 9.5 mg/dL (ref 8.9–10.3)
Chloride: 104 mmol/L (ref 98–111)
Creatinine, Ser: 0.98 mg/dL (ref 0.61–1.24)
GFR calc Af Amer: >60 mL/min (ref >60)
GFR calc non Af Amer: >60 mL/min (ref >60)
Glucose, Bld: 215 mg/dL — ABNORMAL HIGH (ref 70–99)
Potassium: 3.7 mmol/L (ref 3.5–5.1)
Sodium: 136 mmol/L (ref 135–145)

## 2018-08-15 LAB — ECHOCARDIOGRAM COMPLETE
Height: 70 in
Weight: 3212.8 oz

## 2018-08-15 LAB — POCT I-STAT EG7
Bicarbonate: 25.5 mmol/L (ref 20.0–28.0)
Calcium, Ion: 1.28 mmol/L (ref 1.15–1.40)
HCT: 29 % — ABNORMAL LOW (ref 39.0–52.0)
Hemoglobin: 9.9 g/dL — ABNORMAL LOW (ref 13.0–17.0)
O2 Saturation: 66 %
Potassium: 3.9 mmol/L (ref 3.5–5.1)
Sodium: 139 mmol/L (ref 135–145)
TCO2: 27 mmol/L (ref 22–32)
pCO2, Ven: 42.1 mmHg — ABNORMAL LOW (ref 44.0–60.0)
pH, Ven: 7.39 (ref 7.250–7.430)
pO2, Ven: 35 mmHg (ref 32.0–45.0)

## 2018-08-15 LAB — CBC
HCT: 29.8 % — ABNORMAL LOW (ref 39.0–52.0)
Hemoglobin: 9.6 g/dL — ABNORMAL LOW (ref 13.0–17.0)
MCH: 34.2 pg — ABNORMAL HIGH (ref 26.0–34.0)
MCHC: 32.2 g/dL (ref 30.0–36.0)
MCV: 106 fL — ABNORMAL HIGH (ref 80.0–100.0)
Platelets: 29 10*3/uL — CL (ref 150–400)
RBC: 2.81 MIL/uL — ABNORMAL LOW (ref 4.22–5.81)
RDW: 19.5 % — ABNORMAL HIGH (ref 11.5–15.5)
WBC: 6.5 10*3/uL (ref 4.0–10.5)
nRBC: 0.5 % — ABNORMAL HIGH (ref 0.0–0.2)

## 2018-08-15 LAB — COMPREHENSIVE METABOLIC PANEL
ALT: 64 U/L — ABNORMAL HIGH (ref 0–44)
AST: 57 U/L — ABNORMAL HIGH (ref 15–41)
Albumin: 4 g/dL (ref 3.5–5.0)
Alkaline Phosphatase: 100 U/L (ref 38–126)
Anion gap: 8 (ref 5–15)
BUN: 10 mg/dL (ref 6–20)
CO2: 27 mmol/L (ref 22–32)
Calcium: 8.9 mg/dL (ref 8.9–10.3)
Chloride: 102 mmol/L (ref 98–111)
Creatinine, Ser: 0.85 mg/dL (ref 0.61–1.24)
GFR calc Af Amer: >60 mL/min (ref >60)
GFR calc non Af Amer: >60 mL/min (ref >60)
Glucose, Bld: 159 mg/dL — ABNORMAL HIGH (ref 70–99)
Potassium: 3.1 mmol/L — ABNORMAL LOW (ref 3.5–5.1)
Sodium: 137 mmol/L (ref 135–145)
Total Bilirubin: 4.1 mg/dL — ABNORMAL HIGH (ref 0.3–1.2)
Total Protein: 6.6 g/dL (ref 6.5–8.1)

## 2018-08-15 LAB — FIBRINOGEN: Fibrinogen: 330 mg/dL (ref 210–475)

## 2018-08-15 LAB — HIV ANTIBODY (ROUTINE TESTING W REFLEX): HIV Screen 4th Generation wRfx: NONREACTIVE

## 2018-08-15 LAB — GLUCOSE, CAPILLARY
Glucose-Capillary: 173 mg/dL — ABNORMAL HIGH (ref 70–99)
Glucose-Capillary: 202 mg/dL — ABNORMAL HIGH (ref 70–99)
Glucose-Capillary: 228 mg/dL — ABNORMAL HIGH (ref 70–99)
Glucose-Capillary: 249 mg/dL — ABNORMAL HIGH (ref 70–99)
Glucose-Capillary: 97 mg/dL (ref 70–99)

## 2018-08-15 LAB — APTT: aPTT: 31 seconds (ref 24–36)

## 2018-08-15 LAB — PROTIME-INR
INR: 1.1 (ref 0.8–1.2)
INR: 1.1 (ref 0.8–1.2)
Prothrombin Time: 14 seconds (ref 11.4–15.2)
Prothrombin Time: 14.4 seconds (ref 11.4–15.2)

## 2018-08-15 LAB — TROPONIN I
Troponin I: 0.03 ng/mL (ref <0.03)
Troponin I: 0.03 ng/mL (ref <0.03)

## 2018-08-15 LAB — LACTATE DEHYDROGENASE: LDH: 774 U/L — ABNORMAL HIGH (ref 98–192)

## 2018-08-15 LAB — SAVE SMEAR(SSMR), FOR PROVIDER SLIDE REVIEW

## 2018-08-15 LAB — D-DIMER, QUANTITATIVE (NOT AT ARMC): D-Dimer, Quant: 0.56 ug/mL-FEU — ABNORMAL HIGH (ref 0.00–0.50)

## 2018-08-15 MED ORDER — METHYLPREDNISOLONE SODIUM SUCC 125 MG IJ SOLR
80.0000 mg | Freq: Every day | INTRAMUSCULAR | Status: DC
  Administered 2018-08-15 – 2018-08-16 (×2): 80 mg via INTRAVENOUS
  Filled 2018-08-15 (×4): qty 2

## 2018-08-15 MED ORDER — INSULIN ASPART 100 UNIT/ML ~~LOC~~ SOLN
0.0000 [IU] | Freq: Every day | SUBCUTANEOUS | Status: DC
  Administered 2018-08-15: 2 [IU] via SUBCUTANEOUS
  Administered 2018-08-16: 23:00:00 4 [IU] via SUBCUTANEOUS
  Administered 2018-08-17: 2 [IU] via SUBCUTANEOUS

## 2018-08-15 MED ORDER — VITAMIN B-1 100 MG PO TABS
100.0000 mg | ORAL_TABLET | Freq: Every day | ORAL | Status: DC
  Administered 2018-08-15 – 2018-08-19 (×5): 100 mg via ORAL
  Filled 2018-08-15 (×5): qty 1

## 2018-08-15 MED ORDER — ACETAMINOPHEN 325 MG PO TABS: 650.0000 mg | ORAL_TABLET | ORAL | Status: DC | PRN

## 2018-08-15 MED ORDER — PROMETHAZINE HCL 25 MG PO TABS: 12.5000 mg | ORAL_TABLET | Freq: Four times a day (QID) | ORAL | Status: DC | PRN

## 2018-08-15 MED ORDER — DIPHENHYDRAMINE HCL 25 MG PO CAPS
ORAL_CAPSULE | ORAL | Status: AC
  Administered 2018-08-15: 25 mg via ORAL
  Filled 2018-08-15: qty 1

## 2018-08-15 MED ORDER — NICOTINE 14 MG/24HR TD PT24
14.0000 mg | MEDICATED_PATCH | Freq: Every day | TRANSDERMAL | Status: DC
  Administered 2018-08-15 – 2018-08-19 (×5): 14 mg via TRANSDERMAL
  Filled 2018-08-15 (×5): qty 1

## 2018-08-15 MED ORDER — SODIUM CHLORIDE 0.9 % IV SOLN
4.0000 g | Freq: Once | INTRAVENOUS | Status: DC
  Administered 2018-08-15: 4 g via INTRAVENOUS
  Filled 2018-08-15: qty 40

## 2018-08-15 MED ORDER — ATORVASTATIN CALCIUM 40 MG PO TABS
40.0000 mg | ORAL_TABLET | Freq: Every day | ORAL | Status: DC
  Administered 2018-08-15 – 2018-08-19 (×5): 40 mg via ORAL
  Filled 2018-08-15 (×5): qty 1

## 2018-08-15 MED ORDER — MORPHINE SULFATE (PF) 2 MG/ML IV SOLN
1.0000 mg | Freq: Once | INTRAVENOUS | Status: AC
  Administered 2018-08-15: 1 mg via INTRAVENOUS
  Filled 2018-08-15 (×2): qty 1

## 2018-08-15 MED ORDER — PANTOPRAZOLE SODIUM 40 MG PO TBEC
80.0000 mg | DELAYED_RELEASE_TABLET | Freq: Every day | ORAL | Status: DC
  Administered 2018-08-15: 80 mg via ORAL
  Filled 2018-08-15: qty 2

## 2018-08-15 MED ORDER — TRAMADOL HCL 50 MG PO TABS
50.0000 mg | ORAL_TABLET | Freq: Four times a day (QID) | ORAL | Status: DC
  Administered 2018-08-15 – 2018-08-16 (×5): 50 mg via ORAL
  Filled 2018-08-15 (×6): qty 1

## 2018-08-15 MED ORDER — INSULIN ASPART 100 UNIT/ML ~~LOC~~ SOLN
0.0000 [IU] | Freq: Three times a day (TID) | SUBCUTANEOUS | Status: DC
  Administered 2018-08-15: 5 [IU] via SUBCUTANEOUS
  Administered 2018-08-15: 3 [IU] via SUBCUTANEOUS
  Administered 2018-08-15: 5 [IU] via SUBCUTANEOUS
  Administered 2018-08-16 (×2): 2 [IU] via SUBCUTANEOUS
  Administered 2018-08-17: 5 [IU] via SUBCUTANEOUS
  Administered 2018-08-18: 2 [IU] via SUBCUTANEOUS
  Administered 2018-08-18: 18:00:00 8 [IU] via SUBCUTANEOUS
  Administered 2018-08-18 – 2018-08-19 (×2): 3 [IU] via SUBCUTANEOUS

## 2018-08-15 MED ORDER — DIPHENHYDRAMINE HCL 25 MG PO CAPS: 25.0000 mg | ORAL_CAPSULE | Freq: Four times a day (QID) | ORAL | Status: DC | PRN

## 2018-08-15 MED ORDER — CALCIUM CARBONATE ANTACID 500 MG PO CHEW
CHEWABLE_TABLET | ORAL | Status: AC
  Administered 2018-08-15: 400 mg via ORAL
  Filled 2018-08-15: qty 4

## 2018-08-15 MED ORDER — FOLIC ACID 1 MG PO TABS
1.0000 mg | ORAL_TABLET | Freq: Every day | ORAL | Status: DC
  Administered 2018-08-15 – 2018-08-19 (×5): 1 mg via ORAL
  Filled 2018-08-15 (×5): qty 1

## 2018-08-15 MED ORDER — ACETAMINOPHEN 325 MG PO TABS
650.0000 mg | ORAL_TABLET | Freq: Four times a day (QID) | ORAL | Status: DC | PRN
  Administered 2018-08-15: 03:00:00 650 mg via ORAL
  Filled 2018-08-15: qty 2

## 2018-08-15 MED ORDER — AMLODIPINE BESYLATE 10 MG PO TABS
10.0000 mg | ORAL_TABLET | Freq: Every day | ORAL | Status: DC
  Administered 2018-08-15 – 2018-08-19 (×5): 10 mg via ORAL
  Filled 2018-08-15 (×5): qty 1

## 2018-08-15 MED ORDER — PANTOPRAZOLE SODIUM 40 MG PO TBEC: 80.0000 mg | DELAYED_RELEASE_TABLET | Freq: Every day | ORAL | Status: DC

## 2018-08-15 MED ORDER — CALCIUM CARBONATE ANTACID 500 MG PO CHEW
2.0000 | CHEWABLE_TABLET | ORAL | Status: DC
  Administered 2018-08-15 (×2): 400 mg via ORAL

## 2018-08-15 MED ORDER — SODIUM CHLORIDE 0.9% FLUSH
10.0000 mL | INTRAVENOUS | Status: DC | PRN
  Administered 2018-08-17: 10 mL
  Filled 2018-08-15: qty 40

## 2018-08-15 MED ORDER — ACD FORMULA A 0.73-2.45-2.2 GM/100ML VI SOLN
500.0000 mL | Status: DC
  Administered 2018-08-15: 16:00:00 500 mL via INTRAVENOUS
  Filled 2018-08-15: qty 500

## 2018-08-15 MED ORDER — SODIUM CHLORIDE 0.9% IV SOLUTION
Freq: Once | INTRAVENOUS | Status: AC
  Administered 2018-08-15: 04:00:00 via INTRAVENOUS

## 2018-08-15 MED ORDER — ACD FORMULA A 0.73-2.45-2.2 GM/100ML VI SOLN
Status: AC
  Administered 2018-08-15: 500 mL via INTRAVENOUS
  Filled 2018-08-15: qty 500

## 2018-08-15 MED ORDER — ANTICOAGULANT SODIUM CITRATE 4% (200MG/5ML) IV SOLN: 5.0000 mL | Freq: Once | Status: DC

## 2018-08-15 MED ORDER — ADULT MULTIVITAMIN W/MINERALS CH
1.0000 | ORAL_TABLET | Freq: Every day | ORAL | Status: DC
  Administered 2018-08-15 – 2018-08-19 (×5): 1 via ORAL
  Filled 2018-08-15 (×5): qty 1

## 2018-08-15 MED ORDER — SODIUM CHLORIDE 0.9 % IV SOLN: 2.0000 g | Freq: Once | INTRAVENOUS | Status: DC

## 2018-08-15 MED ORDER — CHLORHEXIDINE GLUCONATE CLOTH 2 % EX PADS
6.0000 | MEDICATED_PAD | Freq: Every day | CUTANEOUS | Status: DC
  Administered 2018-08-15 – 2018-08-19 (×3): 6 via TOPICAL

## 2018-08-15 MED ORDER — DIPHENHYDRAMINE HCL 25 MG PO CAPS
25.0000 mg | ORAL_CAPSULE | Freq: Four times a day (QID) | ORAL | Status: DC | PRN
  Administered 2018-08-15 (×2): 25 mg via ORAL

## 2018-08-15 MED ORDER — METFORMIN HCL 500 MG PO TABS: 1000.0000 mg | ORAL_TABLET | Freq: Every day | ORAL | Status: DC

## 2018-08-15 MED ORDER — CALCIUM CARBONATE ANTACID 500 MG PO CHEW: 2.0000 | CHEWABLE_TABLET | ORAL | Status: DC

## 2018-08-15 MED ORDER — DIPHENHYDRAMINE HCL 50 MG/ML IJ SOLN
25.0000 mg | Freq: Once | INTRAMUSCULAR | Status: AC
  Administered 2018-08-15: 05:00:00 25 mg via INTRAVENOUS
  Filled 2018-08-15: qty 1

## 2018-08-15 MED ORDER — ACETAMINOPHEN 650 MG RE SUPP: 650.0000 mg | Freq: Four times a day (QID) | RECTAL | Status: DC | PRN

## 2018-08-15 MED ORDER — ANTICOAGULANT SODIUM CITRATE 4% (200MG/5ML) IV SOLN
5.0000 mL | Freq: Once | Status: AC
  Administered 2018-08-15: 5 mL
  Filled 2018-08-15: qty 5

## 2018-08-15 MED ORDER — ACD FORMULA A 0.73-2.45-2.2 GM/100ML VI SOLN
500.0000 mL | Status: DC
  Administered 2018-08-15: 500 mL via INTRAVENOUS

## 2018-08-15 NOTE — Consult Note (Signed)
[facility] NOTE  Patient Care Team: Bonnita Hollow, MD as PCP - General (Family Medicine)  HEME/ONC OVERVIEW: 1. Hx of TTP -Patient of Dr. Benay Spice -01/2017: diagnosed with TTP, [name] level < 2%; required plasma exchange, followed by a steroid taper from 01/2017 until 04/2017   ASSESSMENT & PLAN:   New onset anemia, thrombocytopenia  -I reviewed the patient's records in detail, including recent hematology and PCP clinic notes as well as lab studies  -In summary, patient has hx of TTP in 01/2017, for which he was treated with plasma exchange followed by a steroid taper that was completed in 04/2017; at his last visit with Dr. Benay Spice in 2019, he was doing well and did not have any evidence of recurrent TTP. He was recently seen by telemedicine for right-sided chest wall pain, thought to be due to MSK in etiology, but his labs were notable for plts of 38k, Hgb of 10.1 and LDH 774,  concerning for relapsed TTP -I personally reviewed the patient's peripheral blood smear, which showed numerous schistocytes, consistent with relapsed TTP  -I discussed the lab and peripheral blood smear results in detail with the patient -Given the high clinical suspicion as well as labs and peripheral blood smear for relapsed TTP, I recommend starting therapeutic plasma exchange and high-dose steroid IV solumedrol 80mg  daily, first dose now  -I also discussed the case with the family medicine service, and recommended ordering coagulation panel, Hepatitis B/C serologies (including Hep B surface antigen, antibody, core antibody total), LDH, haptoglobin, and most importantly, [name] level  -I have notified blood bank and the on-call dialysis RN overnight; due to the overnight dialysis RN not certified to perform plasma exchange, I recommend administering 2 units of FFP as a temporizing measure until he can be started on plasma exchange in the AM (dialysis unit # [id number]; blood bank  [phone]) -He will require a dialysis catheter for plasma exchange  -Avoid platelet transfusion unless patient develops clinically significant bleeding, as platelet transfusion can further exacerbate TTP  -Prophylactic PPI, given the patient's hx of GERD, which can be exacerbated by high-dose steroid   Tish Men, MD 08/15/2018 12:02 AM   CHIEF COMPLAINTS/PURPOSE OF CONSULTATION:  "I thought I pulled a muscle*"  HISTORY OF PRESENTING ILLNESS: Eric Hooper is a 43 yo AAM w/ hx of TTP who presented to the ED for right-sided chest pain. 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 The pain was exacerbated with palpating that side of the chest and exertion, sharp, non-radiating, not associated with other symptoms. He was seen by telemedicine on 08/12/2018 for this symptom, and was recommended supportive care, including Tylenol. However, his pain persisted, and he presented to the ER for further evaluation.  In the ED, his labs were notable for Hgb 10.1 and plts 38k (vs 16 and 211k in 03/2018, respectively). In addition, his Tbili was elevated at 3.6 with direct component of 0.4. LDH was elevated at 774. Cr was 0.9. Hematology was consulted for possible relapsed TTP.   MEDICAL HISTORY:  Past Medical History:  Diagnosis Date  . Controlled diabetes mellitus type 2 with complications ([facility]) 49/09/7589  . Diabetes mellitus without complication (HCC)    non-insulin dependent  . Diverticulosis 02/01/2017   on CT scan abd/pelvis  . GERD (gastroesophageal reflux disease)   . Hypertension   . Kidney stones 02/03/2017  . Mixed hyperlipidemia 02/28/2018  .  Smoker 04/13/2017    SURGICAL HISTORY: Past Surgical History:  Procedure Laterality Date  . ELBOW SURGERY Right   . IR FLUORO GUIDE CV LINE RIGHT  02/02/2017  . IR US GUIDE VASC ACCESS RIGHT  02/02/2017    SOCIAL HISTORY: Social History   Socioeconomic  History  . Marital status: Single    Spouse name: Not on file  . Number of children: 2  . Years of education: Not on file  . Highest education level: Not on file  Occupational History  . Occupation: Glass blower/designer  Social Needs  . Financial resource strain: Not on file  . Food insecurity:    Worry: Not on file    Inability: Not on file  . Transportation needs:    Medical: Not on file    Non-medical: Not on file  Tobacco Use  . Smoking status: Current Every Day Smoker    Packs/day: 0.50    Years: 15.00    Pack years: 7.50    Types: Cigarettes  . Smokeless tobacco: Never Used  Substance and Sexual Activity  . Alcohol use: Yes    Comment: 3-4 beers per day most days  . Drug use: No  . Sexual activity: Not on file  Lifestyle  . Physical activity:    Days per week: Not on file    Minutes per session: Not on file  . Stress: Not on file  Relationships  . Social connections:    Talks on phone: Not on file    Gets together: Not on file    Attends religious service: Not on file    Active member of club or organization: Not on file    Attends meetings of clubs or organizations: Not on file    Relationship status: Not on file  . Intimate partner violence:    Fear of current or ex partner: Not on file    Emotionally abused: Not on file    Physically abused: Not on file    Forced sexual activity: Not on file  Other Topics Concern  . Not on file  Social History Narrative  . Not on file    FAMILY HISTORY: Family History  Problem Relation Age of Onset  . Hypertension Mother   . Hyperlipidemia Mother   . Hypertension Father   . Colon cancer Maternal Aunt     ALLERGIES:  is allergic to ibuprofen.  MEDICATIONS:  No current facility-administered medications for this encounter.     REVIEW OF SYSTEMS:   Constitutional: ( - ) fevers, ( - )  chills , ( - ) night sweats Eyes: ( - ) blurriness of vision, ( - ) double vision, ( - ) watery eyes Ears, nose, mouth, throat,  and face: ( - ) mucositis, ( - ) sore throat Respiratory: ( - ) cough, ( - ) dyspnea, ( - ) wheezes Cardiovascular: ( - ) palpitation, ( - ) chest discomfort, ( - ) lower extremity swelling Gastrointestinal:  ( - ) nausea, ( - ) heartburn, ( - ) change in bowel habits Skin: ( - ) abnormal skin rashes Lymphatics: ( - ) new lymphadenopathy, ( - ) easy bruising Neurological: ( - ) numbness, ( - ) tingling, ( - ) new weaknesses Behavioral/Psych: ( - ) mood change, ( - ) new changes  All other systems were reviewed with the patient and are negative.  PHYSICAL EXAMINATION: ECOG PERFORMANCE STATUS: 0 - Asymptomatic  Vitals:   08/14/18 2315 08/14/18 2357  BP:  Marland Kitchen)  147/97  Pulse:  75  Resp: 18 18  Temp:  98.7 F (37.1 C)  SpO2:  100%   Filed Weights   08/14/18 1945  Weight: 203 lb (92.1 kg)    GENERAL: alert, no distress and comfortable SKIN: skin color, texture, turgor are normal, no rashes or significant lesions EYES: conjunctiva are pink and non-injected, sclera clear OROPHARYNX: no exudate, no erythema; lips, buccal mucosa, and tongue normal  NECK: supple, non-tender LUNGS: clear to auscultation with normal breathing effort HEART: regular rate & rhythm, no murmurs, no lower extremity edema ABDOMEN: soft, non-tender, non-distended, normal bowel sounds Musculoskeletal: no cyanosis of digits and no clubbing  PSYCH: alert & oriented x 3, fluent speech NEURO: no focal motor/sensory deficits  LABORATORY DATA:  I have reviewed the data as listed Lab Results  Component Value Date   WBC 10.6 (H) 08/14/2018   HGB 10.1 (L) 08/14/2018   HCT 30.7 (L) 08/14/2018   MCV 106.2 (H) 08/14/2018   PLT 38 (L) 08/14/2018   Lab Results  Component Value Date   NA 136 08/14/2018   K 2.9 (L) 08/14/2018   CL 100 08/14/2018   CO2 25 08/14/2018    RADIOGRAPHIC STUDIES: I have personally reviewed the radiological images as listed and agreed with the findings in the report. Dg Chest Portable 1  View  Result Date: 08/14/2018 CLINICAL DATA:  Chest pain EXAM: PORTABLE CHEST 1 VIEW COMPARISON:  12/21/2017 FINDINGS: Heart and mediastinal contours are within normal limits. No focal opacities or effusions. No acute bony abnormality. IMPRESSION: No active disease. Electronically Signed   By: Rolm Baptise M.D.   On: 08/14/2018 20:32    PATHOLOGY: I personally reviewed the patient's peripheral blood smear today.  There were numerous schistocytes throughout the peripheral smear, concerning for thrombotic microangiopathy.  The white blood cells were of normal morphology. There were no peripheral circulating blasts. The platelets were decreased in number.  There was no platelet clumping.

## 2018-08-15 NOTE — Progress Notes (Addendum)
Received call from Antony Contras, MD who stated patient needed HD cath placement tonight at bedside.  AC contacted.  AC scheduled for patient to have HD cath placement at bedside in [place].  Patient will return to [facility] if no complications.  States to given FFP after return to the unit.

## 2018-08-15 NOTE — Progress Notes (Signed)
Patient still with pain around HD cath. Rating 10/10.  FMTS paged.  Matilde Haymaker, MD came to bedside to assess and place orders for additional pain control.

## 2018-08-15 NOTE — Progress Notes (Signed)
Family Medicine Teaching Service Daily Progress Note Intern Pager: [phone]  Patient name: Eric Hooper Medical record number: [id number] Date of birth: 04/01/1976 Age: 43 y.o. Gender: male  Primary Care Provider: Bonnita Hollow, MD Consultants: Heme-Onc Code Status: Full  Pt Overview and Major Events to Date:  Eric Hooper is a 43 y.o. male presenting with dull chest pain and nosebleeds for 4 days.  His past medical history significant for TTP (2018), alcohol use disorder, DMT2, HTN, HLD, GERD.  Assessment and Plan:  Thrombocytopenia, history of moderate thrombocytopenic purpura concern for recurrence History of TTP with labs concerning for recurrence. Schistocytes seen on smear with low platelets (29k), and LDH 774 consistent with TTP. INR normal at 1.1, d-dimer mildly elevated 0.56, DAT negative -Hematology/oncology following, appreciate recommendations; received 2 transfusions of FFP and is due for plasmapheresis today.  - IVJ in place to receive plasmapheresis. - 80mg  of Solumedrol IV daily -Follow-up labs: direct bili, LDH, haptoglobin, [name], d-dimer, for prevention, Hepatitis B/C labs -Cont SCDs (thrombocytopenic, no anticoagulation)  ACS rule out Acute, resolved. Does not appear to be cardiac. Most likely related to symptomatic TTP although this does not appear to be a common symptoms. Could also be a combination of GERD symptoms and MSK. He is not tender along the chest wall this am and states it feels like "the last time he had TTP". EKG this am significant for NSR with partial LBBB, no ST or T wave changes. Troponins <0.03, 0.03, 0.03. Will stop trending. Echo with LVEF of 55-60% with no wall motion abnormalities, right ventricle systolic pressure could not be assessed. CXR negative for any acute cardiopulmonary disease. - D/C telemetry - monitor vitals and reassess if pain returns  Diabetes, type II, stable His history of type 2 diabetes.  Last A1c on 06/2018 was  6.6.  Blood glucose on admission was 110.  Home medication includes metformin 1000 mg daily.  No sign of kidney injury on admission. -Cont holding home metformin - sSSI in light of high dose of IV steroids given for TTP which will likely increase his glucose. - Add on long acting if consistently elevated > 200  Hypertension On home amlodipine 10mg . SBP 116-182 and DBP 81-106 since admission. Normotensive for the past 8 hours. - Cont amlodipine 10mg  daily - Consider starting ACEi or ARB for renal protection given he is diabetic.  GERD History of GERD taking Nexium 40mg  daily at home. Steroids could make his GERD symptoms worse temporarily. Most likely not improving with Nexium given his heavy EtOH intake daily. - Pantoprazole 80 mg daily - GI consult as outpatient  Nicotine use disorder He reports smoking about half a pack of cigarettes per day.  He requested a nicotine patch during his hospitalization. -NicoDerm patch 14 mg daily  EtOH use disorder He reports drinking at least 6 beers daily, often more.  He denies ever having experienced withdrawal. -Monitor CIWA scores every 4 hours -Cont with no Ativan scheduled at present  FEN/GI: reg diet PPx: SCDs due to thrombocytopenia  Disposition: likely home  Subjective:  Patient is resting comfortably in bed this morning. Chest pain has completely resolved. He still has some lingering epigastric pain. No other complaints.  Objective: Temp:  [97.9 F (36.6 C)-99.3 F (37.4 C)] 97.9 F (36.6 C) (04/20 0742) Pulse Rate:  [61-90] 70 (04/20 0742) Resp:  [16-27] 18 (04/20 0742) BP: (131-182)/(85-106) 131/88 (04/20 0742) SpO2:  [96 %-100 %] 99 % (04/20 0742) Weight:  [91.1 kg-92.1 kg] 91.1 kg (04/20  0006) Physical Exam: Gen: Alert and Oriented x 3, NAD HEENT: Normocephalic, atraumatic CV: RRR, no murmurs, normal S1, S2 split Resp: CTAB, no wheezing, rales, or rhonchi, comfortable work of breathing Abd: non-distended, epigastric  tenderness, soft, +bs in all four quadrants Ext: no clubbing, cyanosis, or edema Skin: warm, dry, intact, no rashes   Laboratory: Recent Labs  Lab 08/14/18 2052 08/15/18 0356  WBC 10.6* 6.5  HGB 10.1* 9.6*  HCT 30.7* 29.8*  PLT 38* 29*   Recent Labs  Lab 08/14/18 2052 08/15/18 0356  NA 136 137  K 2.9* 3.1*  CL 100 102  CO2 25 27  BUN 13 10  CREATININE 0.94 0.85  CALCIUM 8.9 8.9  PROT 6.8 6.6  BILITOT 3.6* 4.1*  ALKPHOS 104 100  ALT 65* 64*  AST 61* 57*  GLUCOSE 110* 159*   Troponin I: <0.03, 0.03, 0.03 Lipase: 63 Direct Bili: 0.04 LDH: 774 Haptoglobin: pending Fibrinogen: 330 (WNL) Smear: Schistocytes [name]: pending INR: 1.1  APTT: 31 (WNL) D-DIMER: 0.56 DAT neg HIV: pending Hep B/C: pending   Imaging/Diagnostic Tests: CXR: No active cardiopulmonary disease  Nuala Alpha, DO 08/15/2018, 9:41 AM PGY-2, [facility] Intern pager: [phone], text pages welcome

## 2018-08-15 NOTE — Progress Notes (Signed)
Received phone call from FMTS Matilde Haymaker, MD.  HD will not be ready for patient until around 0800.  Ordered to give second unit of FFP.  Patient not exhibiting any additional adverse reactions.

## 2018-08-15 NOTE — Plan of Care (Signed)
  Problem: Nutrition: Goal: Adequate nutrition will be maintained Outcome: Progressing   Problem: Activity: Goal: Risk for activity intolerance will decrease Outcome: Completed/Met   Problem: Safety: Goal: Ability to remain free from injury will improve Outcome: Completed/Met

## 2018-08-15 NOTE — Progress Notes (Signed)
Patient alerts primary RN of itching approximately 30 minutes after start of FFP.  RN paged FMTS.  Given order for Benadryl, continue FFP, and monitor for additional symptoms.  No additional symptoms at this time.

## 2018-08-15 NOTE — Progress Notes (Signed)
Received a page that pt complained of 10/10 pain at catheter site.  Pt was seen at bedside.  Procedural site was inspected and found to be without erythema, crepitus or hematoma. CXR without pneumothorax.  Morphine 1 mg IV one time given for pain.

## 2018-08-15 NOTE — Progress Notes (Signed)
  Echocardiogram 2D Echocardiogram has been performed.  Eric Hooper 08/15/2018, 10:24 AM

## 2018-08-15 NOTE — Progress Notes (Signed)
Primary RN returned with patient to [id number].  VSS.  CCMD notified.  Patient with complaints of pain around HD insertion site. Tylenol given.

## 2018-08-15 NOTE — Progress Notes (Signed)
Brief Hematology Note:  I have stopped by to see the patient twice today but he has been plasmapheresis both times.  I have spoken with nursing. Other than the nosebleeds previously reported, he has had no other bleeding.  Pheresis catheter has been placed and he is currently receiving plasmapheresis as ordered.  Solu-Medrol has already been ordered.  I have contacted the hospital lab and have confirmed that the ADAMSTS13 has been collected and sent.  Dr. Benay Spice is aware of the patient's admission and will follow up with the patient tomorrow morning.  [name], DNP, AGPCNP-BC, AOCNP

## 2018-08-15 NOTE — Procedures (Addendum)
Temporary HD Catheter Insertion Procedure Note  Procedure: Insertion of Trialysis(R) HD catheter  Indications:  vascular access, plasma exchange  Procedure Details  Informed consent was obtained for the procedure, including sedation.  Risks of lung perforation, hemorrhage, arrhythmia, and adverse drug reaction were discussed.   Maximum sterile technique was used including antiseptics, cap, gloves, gown, hand hygiene, mask and sheet.  Under sterile conditions the skin above the midline chest, at base of throat, on the right internal jugular vein was prepped with betadine and covered with a sterile drape. Local anesthesia was applied to the skin and subcutaneous tissues. A 22-gauge needle was used to identify the vein. An 18-gauge needle was then inserted into the vein. A guide wire was then passed easily through the catheter. There were occasional PVCs. The catheter was then withdrawn. A 13 [place] Trialysis HD was then inserted into the vessel over the guide wire. The catheter was sutured into place.  Findings: There were no changes to vital signs. Catheter was flushed with 10 cc NS. Patient did tolerate procedure well.  Recommendations: CXR ordered to verify placement.  Renee Pain, MD Board Certified by the [employer], [place]

## 2018-08-16 LAB — BASIC METABOLIC PANEL
Anion gap: 10 (ref 5–15)
Anion gap: 8 (ref 5–15)
BUN: 15 mg/dL (ref 6–20)
BUN: 16 mg/dL (ref 6–20)
CO2: 30 mmol/L (ref 22–32)
CO2: 31 mmol/L (ref 22–32)
Calcium: 9.4 mg/dL (ref 8.9–10.3)
Calcium: 9.6 mg/dL (ref 8.9–10.3)
Chloride: 103 mmol/L (ref 98–111)
Chloride: 100 mmol/L (ref 98–111)
Creatinine, Ser: 0.99 mg/dL (ref 0.61–1.24)
Creatinine, Ser: 1.2 mg/dL (ref 0.61–1.24)
GFR calc Af Amer: >60 mL/min (ref >60)
GFR calc Af Amer: >60 mL/min (ref >60)
GFR calc non Af Amer: >60 mL/min (ref >60)
GFR calc non Af Amer: >60 mL/min (ref >60)
Glucose, Bld: 125 mg/dL — ABNORMAL HIGH (ref 70–99)
Glucose, Bld: 312 mg/dL — ABNORMAL HIGH (ref 70–99)
Potassium: 3.4 mmol/L — ABNORMAL LOW (ref 3.5–5.1)
Potassium: 3.2 mmol/L — ABNORMAL LOW (ref 3.5–5.1)
Sodium: 143 mmol/L (ref 135–145)
Sodium: 139 mmol/L (ref 135–145)

## 2018-08-16 LAB — THERAPEUTIC PLASMA EXCHANGE (BLOOD BANK)
Plasma Exchange: 3435
Plasma volume needed: 3429
Unit division: 0
Unit division: 0
Unit division: 0
Unit division: 0
Unit division: 0
Unit division: 0
Unit division: 0
Unit division: 0
Unit division: 0
Unit division: 0

## 2018-08-16 LAB — PREPARE FRESH FROZEN PLASMA
Unit division: 0
Unit division: 0

## 2018-08-16 LAB — BPAM FFP
Blood Product Expiration Date: 202004232359
Blood Product Expiration Date: 202004242359
ISSUE DATE / TIME: 202004200414
ISSUE DATE / TIME: 202004200629
Unit Type and Rh: 6200
Unit Type and Rh: 6200

## 2018-08-16 LAB — CBC
HCT: 31.6 % — ABNORMAL LOW (ref 39.0–52.0)
Hemoglobin: 10.1 g/dL — ABNORMAL LOW (ref 13.0–17.0)
MCH: 34.1 pg — ABNORMAL HIGH (ref 26.0–34.0)
MCHC: 32 g/dL (ref 30.0–36.0)
MCV: 106.8 fL — ABNORMAL HIGH (ref 80.0–100.0)
Platelets: 44 10*3/uL — ABNORMAL LOW (ref 150–400)
RBC: 2.96 MIL/uL — ABNORMAL LOW (ref 4.22–5.81)
RDW: 19.7 % — ABNORMAL HIGH (ref 11.5–15.5)
WBC: 13 10*3/uL — ABNORMAL HIGH (ref 4.0–10.5)
nRBC: 0.2 % (ref 0.0–0.2)

## 2018-08-16 LAB — HEPATITIS B CORE ANTIBODY, TOTAL: Hep B Core Total Ab: NEGATIVE

## 2018-08-16 LAB — GLUCOSE, CAPILLARY
Glucose-Capillary: 128 mg/dL — ABNORMAL HIGH (ref 70–99)
Glucose-Capillary: 150 mg/dL — ABNORMAL HIGH (ref 70–99)
Glucose-Capillary: 337 mg/dL — ABNORMAL HIGH (ref 70–99)

## 2018-08-16 LAB — HCV COMMENT:

## 2018-08-16 LAB — HEPATITIS C ANTIBODY (REFLEX): HCV Ab: <0.1 s/co ratio (ref 0.0–0.9)

## 2018-08-16 LAB — PATHOLOGIST SMEAR REVIEW

## 2018-08-16 LAB — HEPATITIS B SURFACE ANTIGEN: Hepatitis B Surface Ag: NEGATIVE

## 2018-08-16 LAB — HAPTOGLOBIN: Haptoglobin: <10 mg/dL — ABNORMAL LOW (ref 23–355)

## 2018-08-16 LAB — HEPATITIS B SURFACE ANTIBODY, QUANTITATIVE: Hep B S AB Quant (Post): <3.1 m[IU]/mL — ABNORMAL LOW (ref >9.9)

## 2018-08-16 MED ORDER — CALCIUM CARBONATE ANTACID 500 MG PO CHEW
2.0000 | CHEWABLE_TABLET | ORAL | Status: AC
  Administered 2018-08-16: 400 mg via ORAL

## 2018-08-16 MED ORDER — SODIUM CHLORIDE 0.9 % IV SOLN
INTRAVENOUS | Status: DC | PRN
  Administered 2018-08-16: 250 mL via INTRAVENOUS
  Administered 2018-08-18: 1000 mL via INTRAVENOUS

## 2018-08-16 MED ORDER — FAMOTIDINE IN NACL 20-0.9 MG/50ML-% IV SOLN
20.0000 mg | INTRAVENOUS | Status: DC
  Administered 2018-08-16 – 2018-08-18 (×3): 20 mg via INTRAVENOUS
  Filled 2018-08-16 (×4): qty 50

## 2018-08-16 MED ORDER — TRAMADOL HCL 50 MG PO TABS: 50.0000 mg | ORAL_TABLET | Freq: Four times a day (QID) | ORAL | Status: DC | PRN

## 2018-08-16 MED ORDER — DIPHENHYDRAMINE HCL 12.5 MG/5ML PO ELIX
25.0000 mg | ORAL_SOLUTION | Freq: Four times a day (QID) | ORAL | Status: DC | PRN
  Administered 2018-08-16: 25 mg via ORAL
  Filled 2018-08-16 (×3): qty 10

## 2018-08-16 MED ORDER — ACETAMINOPHEN 325 MG PO TABS
650.0000 mg | ORAL_TABLET | ORAL | Status: DC | PRN
  Administered 2018-08-17: 650 mg via ORAL
  Filled 2018-08-16: qty 2

## 2018-08-16 MED ORDER — DIPHENHYDRAMINE HCL 25 MG PO CAPS
ORAL_CAPSULE | ORAL | Status: AC
  Administered 2018-08-16: 17:00:00 25 mg via ORAL
  Filled 2018-08-16: qty 1

## 2018-08-16 MED ORDER — HYDROXYZINE HCL 25 MG PO TABS
25.0000 mg | ORAL_TABLET | Freq: Four times a day (QID) | ORAL | Status: DC | PRN
  Administered 2018-08-16 – 2018-08-19 (×3): 25 mg via ORAL
  Filled 2018-08-16 (×3): qty 1

## 2018-08-16 MED ORDER — ACD FORMULA A 0.73-2.45-2.2 GM/100ML VI SOLN
500.0000 mL | Status: DC
  Administered 2018-08-16: 500 mL via INTRAVENOUS
  Filled 2018-08-16: qty 500

## 2018-08-16 MED ORDER — DIPHENHYDRAMINE HCL 25 MG PO CAPS
25.0000 mg | ORAL_CAPSULE | Freq: Four times a day (QID) | ORAL | Status: DC | PRN
  Administered 2018-08-16: 17:00:00 25 mg via ORAL

## 2018-08-16 MED ORDER — SODIUM CHLORIDE 0.9 % IV SOLN
2.0000 g | Freq: Once | INTRAVENOUS | Status: DC
  Filled 2018-08-16: qty 20

## 2018-08-16 MED ORDER — ANTICOAGULANT SODIUM CITRATE 4% (200MG/5ML) IV SOLN
5.0000 mL | Freq: Once | Status: AC
  Administered 2018-08-16: 5 mL
  Filled 2018-08-16: qty 5

## 2018-08-16 MED ORDER — SODIUM CHLORIDE 0.9 % IV SOLN
4.0000 g | Freq: Once | INTRAVENOUS | Status: AC
  Administered 2018-08-16: 4 g via INTRAVENOUS
  Filled 2018-08-16 (×2): qty 40

## 2018-08-16 MED ORDER — CALCIUM CARBONATE ANTACID 500 MG PO CHEW
CHEWABLE_TABLET | ORAL | Status: AC
  Filled 2018-08-16: qty 4

## 2018-08-16 MED ORDER — POTASSIUM CHLORIDE CRYS ER 20 MEQ PO TBCR
40.0000 meq | EXTENDED_RELEASE_TABLET | Freq: Once | ORAL | Status: AC
  Administered 2018-08-16: 09:00:00 40 meq via ORAL
  Filled 2018-08-16: qty 2

## 2018-08-16 NOTE — Progress Notes (Signed)
Writer @ bedside for AM labs from HD pigtail. Upon completion of lab draw, patient asked writer to assess existing PIV as dressing was coming off. Upon assessment, catheter partially exposed and dressing partially removed. Dressing removed, site and exposed catheter cleaned with chloraprep. Catheter advanced without difficulty. PIV flushed without resistance and brisk blood return noted. Transparent dressing applied. New dressing CD&I. Patient tolerated well.

## 2018-08-16 NOTE — Progress Notes (Signed)
Inpatient Diabetes Program Recommendations  AACE/ADA: New Consensus Statement on Inpatient Glycemic Control (2015)  Target Ranges:  Prepandial:   less than 140 mg/dL      Peak postprandial:   less than 180 mg/dL (1-2 hours)      Critically ill patients:  140 - 180 mg/dL   Lab Results  Component Value Date   GLUCAP 128 (H) 08/16/2018   HGBA1C 8.0 (A) 07/11/2018    Review of Glycemic Control Results for Eric Hooper, Eric Hooper (MRN [id number]) as of 08/16/2018 10:33  Ref. Range 08/15/2018 21:03 08/16/2018 06:14  Glucose-Capillary Latest Ref Range: 70 - 99 mg/dL 202 (H) 128 (H)   Diabetes history: Type 2 DM Outpatient Diabetes medications: Metformin 1000 mg QAm Current orders for Inpatient glycemic control: novolog 0-15 units TID, Novolog 0-5 units QHS Solumedrol 80 mg QD  Inpatient Diabetes Program Recommendations:    Noted trends have increased in the setting of steroids. Could consider increasing correction to Novolog 0-20 units TID.  Thanks, Bronson Curb, MSN, RNC-OB Diabetes Coordinator [phone] (8a-5p)

## 2018-08-16 NOTE — Progress Notes (Addendum)
HEMATOLOGY-ONCOLOGY PROGRESS NOTE  SUBJECTIVE: Tolerated plasmapheresis yesterday fairly well with the exception of itching.  He has been using Benadryl and continues to have itching.  He is also currently receiving steroids.  Denies rashes.  Denies bleeding.  No other complaints this morning.  REVIEW OF SYSTEMS:   Constitutional: Denies fevers, chills or abnormal weight loss Eyes: Denies blurriness of vision Ears, nose, mouth, throat, and face: Denies mucositis or sore throat Respiratory: Denies cough, dyspnea or wheezes Cardiovascular: Denies palpitation, chest discomfort Gastrointestinal:  Denies nausea, heartburn or change in bowel habits Skin: Reports itching.  No rashes. Lymphatics: Denies new lymphadenopathy or easy bruising Neurological:Denies numbness, tingling or new weaknesses Behavioral/Psych: Mood is stable, no new changes  Extremities: No lower extremity edema All other systems were reviewed with the patient and are negative.  I have reviewed the past medical history, past surgical history, social history and family history with the patient and they are unchanged from previous note.   PHYSICAL EXAMINATION:  Vitals:   08/16/18 0730 08/16/18 0923  BP: 128/87 119/74  Pulse: 69 73  Resp: 18   Temp: 98.3 F (36.8 C)   SpO2: 98% 100%   Filed Weights   08/14/18 1945 08/15/18 0006 08/16/18 0101  Weight: 203 lb (92.1 kg) 200 lb 12.8 oz (91.1 kg) 199 lb 12.8 oz (90.6 kg)    GENERAL:alert, no distress and comfortable SKIN: skin color, texture, turgor are normal, no rashes or significant lesions EYES: normal, Conjunctiva are pink and non-injected, sclera clear OROPHARYNX:no exudate, no erythema and lips, buccal mucosa, and tongue normal  NECK: supple, thyroid normal size, non-tender, without nodularity LYMPH:  no palpable lymphadenopathy in the cervical, axillary or inguinal LUNGS: clear to auscultation and percussion with normal breathing effort HEART: regular rate &  rhythm and no murmurs and no lower extremity edema ABDOMEN:abdomen soft, non-tender and normal bowel sounds Musculoskeletal:no cyanosis of digits and no clubbing  NEURO: alert & oriented x 3 with fluent speech, no focal motor/sensory deficits  Plasmapheresis catheter without redness or drainage.  LABORATORY DATA:  I have reviewed the data as listed CMP Latest Ref Rng & Units 08/16/2018 08/15/2018 08/15/2018  Glucose 70 - 99 mg/dL 125(H) - 215(H)  BUN 6 - 20 mg/dL 15 - 13  Creatinine 0.61 - 1.24 mg/dL 0.99 - 0.98  Sodium 135 - 145 mmol/L 143 139 136  Potassium 3.5 - 5.1 mmol/L 3.4(L) 3.9 3.7  Chloride 98 - 111 mmol/L 103 - 104  CO2 22 - 32 mmol/L 30 - 24  Calcium 8.9 - 10.3 mg/dL 9.4 - 9.5  Total Protein 6.5 - 8.1 g/dL - - -  Total Bilirubin 0.3 - 1.2 mg/dL - - -  Alkaline Phos 38 - 126 U/L - - -  AST 15 - 41 U/L - - -  ALT 0 - 44 U/L - - -    Lab Results  Component Value Date   WBC 13.0 (H) 08/16/2018   HGB 10.1 (L) 08/16/2018   HCT 31.6 (L) 08/16/2018   MCV 106.8 (H) 08/16/2018   PLT 44 (L) 08/16/2018   NEUTROABS 5.0 08/14/2018    ASSESSMENT AND PLAN:  1. TTP initially diagnosed in October 2018 with recurrence in April 2020.  Initiation of daily plasma exchange and Solu-Medrol 02/02/2017  [name] Antibody- 72, activity less than 2% on initial presentation; [name] from 08/15/2018 is pending.  Last plasma exchange 02/06/2017  Prednisone 60 mg daily beginning 02/08/2017  Prednisone 40 mg daily beginning 02/17/2017  Prednisone 30 mg  daily beginning 03/09/2017  Prednisone 20 mg daily beginning 03/23/2017  Prednisone 10 mg daily beginning 04/01/2017  Prednisone 5 mg daily for 5 days then 5 mg every other day for 5 doses then stop beginning 05/11/2017  Plasma exchange on 08/15/2018. 2. Xiphoid pain-etiology unclear,resolved 3. Hypertension 4. Diabetes 5. Alcohol/Tobacco use 6.History of renal insufficiency 7. Right IJ dialysis catheter placed 02/02/2017,  removed   Eric Hooper has recurrent TTP.  He is currently on Solu-Medrol and started plasmapheresis on 08/15/2018.  He is completed 1 session of plasmapheresis.  His platelets have improved and he has no bleeding.  Hemoglobin is stable at 10.1.  He has developed itching related to plasmapheresis.  1.  Continue daily plasmapheresis. Will monitor counts closely and consider stopping plasmapheresis when his platelet count normalizes. 2.  Continue Solu-Medrol.  Will start to taper this as his platelet count improves. 3.  For itching, continue Benadryl.  Solu-Medrol should be helping some as well.  I have ordered famotidine 20 mg daily IV as a premed for plasmapheresis. 4.  We will consider rituximab as an outpatient for his recurrent TTP.  Eric Bussing, DNP, AGPCNP-BC, AOCNP  Eric Hooper was interviewed and examined.  He reports an episode of nosebleeding on 08/11/2018.  He presented on 08/14/2018 with anterior chest discomfort.  He was found to have anemia/thrombocytopenia.  He was evaluated by Eric Hooper.  The clinical presentation is consistent with recurrent TTP.  He completed a first plasmapheresis yesterday and was started on steroids.  He developed pruritus during and following the plasmapheresis procedure yesterday.  No other complaint.  I recommend continuing daily plasma exchange and Solu-Medrol.  He had TTP in 2018 and now has a recurrence.  I recommend a course of rituximab to be given as an outpatient.  I discussed the potential toxicities associated with rituximab with Eric Hooper and his wife.  Recommendations: 1.  Continue Solu-Medrol and daily plasmapheresis 2.  Premedicate with Tylenol, Benadryl, and Pepcid for the plasmapheresis 3.  Daily CBC, chemistry panel, and LDH 4.  Outpatient rituximab  Hematology will continue following him daily and we will arrange for outpatient follow-up.

## 2018-08-16 NOTE — Progress Notes (Signed)
Transportation  picked up pt to Hemodialysis for treatment.  Already called and gave report to HD.

## 2018-08-16 NOTE — Progress Notes (Signed)
Pt. Requesting med for itching. On call for FMTS paged to make aware.

## 2018-08-16 NOTE — Progress Notes (Signed)
Family Medicine Teaching Service Daily Progress Note Intern Pager: [phone]  Patient name: Eric Hooper Medical record number: [id number] Date of birth: 07-24-1975 Age: 43 y.o. Gender: male  Primary Care Provider: Bonnita Hollow, MD Consultants: Heme-Onc Code Status: Full  Pt Overview and Major Events to Date:  4/19 - admitted with chest pain, ACS ruled out. TTP recurrence.  Assessment and Plan: Eric Hooper a 43 y.o.malepresenting with dull chest pain and nosebleeds for 4 days. His past medical history significant for TTP (2018), alcohol use disorder, DMT2, HTN, HLD, GERD.  Thrombocytopenia, history of moderate thrombocytopenic purpura concern for recurrence History of TTP with labs concerning for recurrence. Schistocytes seen on smear with low platelets (29k), and LDH 774 consistent with TTP. INR normal at 1.1, d-dimer mildly elevated 0.56, DAT negative, elevated direct bili. ADAMTS pending, previously low. Hepatitis and HIV labs negative. Haptoglobin low. Platelets improving, 44 this am. -Hematology/oncology following, appreciate recommendations; received 2 transfusions of FFP and started plasmapheresis yesterday.  - IVJ in place to receive plasmapheresis. - 80mg  of Solumedrol IV daily -Follow-up labs: direct bili, LDH, [name] -Cont SCDs (thrombocytopenic, no anticoagulation)  Chest pain, ACS ruled out Acute, resolved. Does not appear to be cardiac. Most likely related to symptomatic TTP. Could also be a combination of GERD symptoms given alleviation also with PPI. Troponins flat. EKG, ECHO, CXR wnl. Denies pain this am. - monitor vitals and reassess if pain returns  Diabetes, type II, stable Has history of type 2 diabetes.  Last A1c on 06/2018 was 6.6.  Blood glucose on admission was 110.  Home medication includes metformin 1000 mg daily.  No sign of kidney injury on admission. Well controlled last 24 hours, 9u aspart received. -Cont holding home metformin - mSSI in  light of high dose of IV steroids given for TTP which will likely increase his glucose. - Add on long acting if consistently elevated > 200  Pruritis Likely related to TTP. Not much relief with benadryl. - switch to atarax - switch pantoprazole to famotidine  Hypertension On home amlodipine 10mg . Normotensive overnight. - Cont amlodipine 10mg  daily - Consider starting ACEi or ARB for renal protection given he is diabetic.  GERD History of GERD taking Nexium 40mg  daily at home. Steroids could make his GERD symptoms worse temporarily. Most likely not improving with Nexium given his heavy EtOH intake daily. - will switch Pantoprazole to famotidine - GI consult as outpatient  Nicotine use disorder He reports smoking about half a pack of cigarettes per day.  He requested a nicotine patch during his hospitalization. -NicoDerm patch 14 mg daily  EtOH use disorder He reports drinking at least 6 beers daily, often more.  He denies ever having experienced withdrawal. CIWA 0-1 overnight. -Monitor CIWA scores every 4 hours -Cont with no Ativan scheduled at present  FEN/GI: reg diet PPx: SCDs due to thrombocytopenia  Disposition: likely home  Subjective:  Feeling much improved this am. Chest pain resolved. No difficulties breathing. Endorses continued itching despite benadryl.  Objective: Temp:  [97.9 F (36.6 C)-98.5 F (36.9 C)] 98.3 F (36.8 C) (04/21 0730) Pulse Rate:  [68-91] 69 (04/21 0730) Resp:  [11-29] 18 (04/21 0730) BP: (116-139)/(63-89) 128/87 (04/21 0730) SpO2:  [95 %-100 %] 98 % (04/21 0730) Weight:  [90.6 kg] 90.6 kg (04/21 0101) Physical Exam:  Gen: Alert and Oriented x 3, NAD HEENT: Normocephalic, atraumatic CV: RRR, no murmur Resp: CTAB, normal wob. Appropriately saturated on RA. No w/r/r. Abd: soft, NTND, +BS Ext: no clubbing, cyanosis,  or edema Skin: warm, dry, no appreciable lesions.  Laboratory: Recent Labs  Lab 08/14/18 2052 08/15/18 0356  08/15/18 1318 08/16/18 0450  WBC 10.6* 6.5  --  13.0*  HGB 10.1* 9.6* 9.9* 10.1*  HCT 30.7* 29.8* 29.0* 31.6*  PLT 38* 29*  --  44*   Recent Labs  Lab 08/14/18 2052 08/15/18 0356 08/15/18 1212 08/15/18 1318 08/16/18 0450  NA 136 137 136 139 143  K 2.9* 3.1* 3.7 3.9 3.4*  CL 100 102 104  --  103  CO2 25 27 24   --  30  BUN 13 10 13   --  15  CREATININE 0.94 0.85 0.98  --  0.99  CALCIUM 8.9 8.9 9.5  --  9.4  PROT 6.8 6.6  --   --   --   BILITOT 3.6* 4.1*  --   --   --   ALKPHOS 104 100  --   --   --   ALT 65* 64*  --   --   --   AST 61* 57*  --   --   --   GLUCOSE 110* 159* 215*  --  125*   Troponin I: <0.03, 0.03, 0.03 Lipase: 63 Direct Bili: 0.04 LDH: 774 Haptoglobin: low Fibrinogen: 330 (WNL) Smear: Schistocytes [name]: pending INR: 1.1  APTT: 31 (WNL) D-DIMER: 0.56 DAT neg HIV: negative Hep B/C: negative  Imaging/Diagnostic Tests: No results found.  Rory Percy, DO 08/16/2018, 7:50 AM PGY-2, [place] Intern pager: [phone], text pages welcome

## 2018-08-17 ENCOUNTER — Encounter: Payer: Self-pay | Admitting: *Deleted

## 2018-08-17 ENCOUNTER — Ambulatory Visit: Payer: Commercial Managed Care - PPO | Admitting: Family Medicine

## 2018-08-17 LAB — POCT I-STAT EG7
Acid-Base Excess: 6 mmol/L — ABNORMAL HIGH (ref 0.0–2.0)
Bicarbonate: 31.3 mmol/L — ABNORMAL HIGH (ref 20.0–28.0)
Calcium, Ion: 1.08 mmol/L — ABNORMAL LOW (ref 1.15–1.40)
HCT: 27 % — ABNORMAL LOW (ref 39.0–52.0)
Hemoglobin: 9.2 g/dL — ABNORMAL LOW (ref 13.0–17.0)
O2 Saturation: 59 %
Potassium: 3.5 mmol/L (ref 3.5–5.1)
Sodium: 141 mmol/L (ref 135–145)
TCO2: 33 mmol/L — ABNORMAL HIGH (ref 22–32)
pCO2, Ven: 49.4 mmHg (ref 44.0–60.0)
pH, Ven: 7.41 (ref 7.250–7.430)
pO2, Ven: 31 mmHg — CL (ref 32.0–45.0)

## 2018-08-17 LAB — THERAPEUTIC PLASMA EXCHANGE (BLOOD BANK)
Plasma volume needed: 3429
Unit division: 0
Unit division: 0
Unit division: 0
Unit division: 0
Unit division: 0
Unit division: 0
Unit division: 0
Unit division: 0
Unit division: 0
Unit division: 0

## 2018-08-17 LAB — BASIC METABOLIC PANEL
Anion gap: 9 (ref 5–15)
BUN: 11 mg/dL (ref 6–20)
CO2: 27 mmol/L (ref 22–32)
Calcium: 9.4 mg/dL (ref 8.9–10.3)
Chloride: 102 mmol/L (ref 98–111)
Creatinine, Ser: 0.87 mg/dL (ref 0.61–1.24)
GFR calc Af Amer: >60 mL/min (ref >60)
GFR calc non Af Amer: >60 mL/min (ref >60)
Glucose, Bld: 219 mg/dL — ABNORMAL HIGH (ref 70–99)
Potassium: 3.7 mmol/L (ref 3.5–5.1)
Sodium: 138 mmol/L (ref 135–145)

## 2018-08-17 LAB — COMPREHENSIVE METABOLIC PANEL
ALT: 45 U/L — ABNORMAL HIGH (ref 0–44)
AST: 30 U/L (ref 15–41)
Albumin: 3.3 g/dL — ABNORMAL LOW (ref 3.5–5.0)
Alkaline Phosphatase: 60 U/L (ref 38–126)
Anion gap: 10 (ref 5–15)
BUN: 14 mg/dL (ref 6–20)
CO2: 32 mmol/L (ref 22–32)
Calcium: 8.9 mg/dL (ref 8.9–10.3)
Chloride: 100 mmol/L (ref 98–111)
Creatinine, Ser: 0.93 mg/dL (ref 0.61–1.24)
GFR calc Af Amer: >60 mL/min (ref >60)
GFR calc non Af Amer: >60 mL/min (ref >60)
Glucose, Bld: 125 mg/dL — ABNORMAL HIGH (ref 70–99)
Potassium: 3.1 mmol/L — ABNORMAL LOW (ref 3.5–5.1)
Sodium: 142 mmol/L (ref 135–145)
Total Bilirubin: 1.4 mg/dL — ABNORMAL HIGH (ref 0.3–1.2)
Total Protein: 5.2 g/dL — ABNORMAL LOW (ref 6.5–8.1)

## 2018-08-17 LAB — GLUCOSE, CAPILLARY
Glucose-Capillary: 105 mg/dL — ABNORMAL HIGH (ref 70–99)
Glucose-Capillary: 238 mg/dL — ABNORMAL HIGH (ref 70–99)
Glucose-Capillary: 219 mg/dL — ABNORMAL HIGH (ref 70–99)
Glucose-Capillary: 221 mg/dL — ABNORMAL HIGH (ref 70–99)

## 2018-08-17 LAB — CBC WITH DIFFERENTIAL/PLATELET
Abs Immature Granulocytes: 0.02 10*3/uL (ref 0.00–0.07)
Basophils Absolute: 0.1 10*3/uL (ref 0.0–0.1)
Basophils Relative: 1 %
Eosinophils Absolute: 0.4 10*3/uL (ref 0.0–0.5)
Eosinophils Relative: 4 %
HCT: 30.3 % — ABNORMAL LOW (ref 39.0–52.0)
Hemoglobin: 9.8 g/dL — ABNORMAL LOW (ref 13.0–17.0)
Immature Granulocytes: 0 %
Lymphocytes Relative: 49 %
Lymphs Abs: 4.3 10*3/uL — ABNORMAL HIGH (ref 0.7–4.0)
MCH: 34.9 pg — ABNORMAL HIGH (ref 26.0–34.0)
MCHC: 32.3 g/dL (ref 30.0–36.0)
MCV: 107.8 fL — ABNORMAL HIGH (ref 80.0–100.0)
Monocytes Absolute: 0.5 10*3/uL (ref 0.1–1.0)
Monocytes Relative: 6 %
Neutro Abs: 3.5 10*3/uL (ref 1.7–7.7)
Neutrophils Relative %: 40 %
Platelets: 74 10*3/uL — ABNORMAL LOW (ref 150–400)
RBC: 2.81 MIL/uL — ABNORMAL LOW (ref 4.22–5.81)
RDW: 18.1 % — ABNORMAL HIGH (ref 11.5–15.5)
WBC: 8.7 10*3/uL (ref 4.0–10.5)
nRBC: 0 % (ref 0.0–0.2)

## 2018-08-17 LAB — LACTATE DEHYDROGENASE: LDH: 210 U/L — ABNORMAL HIGH (ref 98–192)

## 2018-08-17 LAB — RETICULOCYTES
Immature Retic Fract: 30.1 % — ABNORMAL HIGH (ref 2.3–15.9)
RBC.: 2.81 MIL/uL — ABNORMAL LOW (ref 4.22–5.81)
Retic Count, Absolute: 313.9 10*3/uL — ABNORMAL HIGH (ref 19.0–186.0)
Retic Ct Pct: 11.2 % — ABNORMAL HIGH (ref 0.4–3.1)

## 2018-08-17 MED ORDER — SODIUM CHLORIDE 0.9 % IV SOLN
2.0000 g | INTRAVENOUS | Status: AC
  Administered 2018-08-17: 2 g via INTRAVENOUS
  Filled 2018-08-17: qty 20

## 2018-08-17 MED ORDER — ACD FORMULA A 0.73-2.45-2.2 GM/100ML VI SOLN
500.0000 mL | Status: DC
  Administered 2018-08-18: 17:00:00 500 mL via INTRAVENOUS
  Filled 2018-08-17: qty 500

## 2018-08-17 MED ORDER — CALCIUM CARBONATE ANTACID 500 MG PO CHEW
2.0000 | CHEWABLE_TABLET | ORAL | Status: AC
  Administered 2018-08-17 (×2): 400 mg via ORAL
  Filled 2018-08-17: qty 2

## 2018-08-17 MED ORDER — PREDNISONE 50 MG PO TABS
60.0000 mg | ORAL_TABLET | Freq: Every day | ORAL | Status: DC
  Administered 2018-08-17 – 2018-08-19 (×3): 60 mg via ORAL
  Filled 2018-08-17 (×4): qty 1

## 2018-08-17 MED ORDER — DIPHENHYDRAMINE HCL 25 MG PO CAPS: 25.0000 mg | ORAL_CAPSULE | Freq: Four times a day (QID) | ORAL | Status: DC | PRN

## 2018-08-17 MED ORDER — DIPHENHYDRAMINE HCL 12.5 MG/5ML PO ELIX
25.0000 mg | ORAL_SOLUTION | Freq: Four times a day (QID) | ORAL | Status: DC | PRN
  Administered 2018-08-17: 25 mg via ORAL
  Filled 2018-08-17 (×2): qty 10

## 2018-08-17 MED ORDER — CALCIUM CARBONATE ANTACID 500 MG PO CHEW
CHEWABLE_TABLET | ORAL | Status: AC
  Administered 2018-08-17: 400 mg
  Filled 2018-08-17: qty 2

## 2018-08-17 MED ORDER — ACETAMINOPHEN 325 MG PO TABS: 650.0000 mg | ORAL_TABLET | ORAL | Status: DC | PRN

## 2018-08-17 MED ORDER — DIPHENHYDRAMINE HCL 25 MG PO CAPS
25.0000 mg | ORAL_CAPSULE | Freq: Four times a day (QID) | ORAL | Status: DC | PRN
  Filled 2018-08-17: qty 1

## 2018-08-17 MED ORDER — ANTICOAGULANT SODIUM CITRATE 4% (200MG/5ML) IV SOLN
5.0000 mL | Status: AC
  Administered 2018-08-17: 5 mL
  Filled 2018-08-17: qty 5

## 2018-08-17 MED ORDER — ENOXAPARIN SODIUM 40 MG/0.4ML ~~LOC~~ SOLN
40.0000 mg | Freq: Every day | SUBCUTANEOUS | Status: DC
  Administered 2018-08-17 – 2018-08-18 (×2): 40 mg via SUBCUTANEOUS
  Filled 2018-08-17 (×3): qty 0.4

## 2018-08-17 MED ORDER — POTASSIUM CHLORIDE CRYS ER 20 MEQ PO TBCR
40.0000 meq | EXTENDED_RELEASE_TABLET | Freq: Two times a day (BID) | ORAL | Status: AC
  Administered 2018-08-17 (×2): 40 meq via ORAL
  Filled 2018-08-17 (×2): qty 2

## 2018-08-17 NOTE — Progress Notes (Signed)
Per IV team, nursing may use the purple port on patient's triple lumen HD catheter. Per IV team, there in no need for order from nephrology to use port to administer medications. IV team will place central line care orders for patient.

## 2018-08-17 NOTE — Progress Notes (Signed)
[name] activity reflex panel result called 2.0 family medicine MD notified, MD stated he will notify the oncology. Result paper place in the patient chart. Will continue to monitor the patient.

## 2018-08-17 NOTE — Progress Notes (Signed)
Family Medicine Teaching Service Daily Progress Note Intern Pager: [phone]  Patient name: Eric Hooper Medical record number: [id number] Date of birth: 13-Sep-1975 Age: 43 y.o. Gender: male  Primary Care Provider: Bonnita Hollow, MD Consultants: Heme-Onc Code Status: Full  Pt Overview and Major Events to Date:  4/19 - admitted with chest pain, ACS ruled out. TTP recurrence.  Assessment and Plan: Eric Settlesis a 42 y.o.malepresenting with dull chest pain and nosebleeds for 4 days. His past medical history significant for TTP (2018), alcohol use disorder, DMT2, HTN, HLD, GERD.  Recurrent thrombocytopenic purpura, hereditary: Improving.  Day 3 of plasmapheresis via right IVJ.  Platelets trending up, 74 from 44 yesterday.  LDH now 210, from 774. ADAMTS returned <2% per nursing faxed report from lab, noting severe deficiency.  - Hematology on board: Cont daily plasmapheresis, transition to prednisone 60 mg daily  - Will be considering rituximab outpatient once discharged - Monitor LDH, CBC - Start DVT prophylaxis as platelets >50   Diabetes, type II: Chronic, stable CBG generally ranging 100s, 1 outlier in 300s.  Received 8 units SSI in the past 24 hours.  A1c 6.6, only on metformin at home.  We will continue to monitor closely given prednisone use, however do note he is de-escalating on his steroid dosing today.  - mSSI -CBGs with meals and nightly - Continue holding home metformin  Hypokalemia: K 3.1 this morning.  Likely related to RBC production. - K. Dur 40 mEq x2  Hypertension: Chronic, stable.  Remains normotensive.   - Continue home amlodipine 10 mg daily - Consider starting ACE or ARB for diabetic renal protection  GERD: Chronic, stable.  No current symptomatology.  Takes Nexium 40mg  daily at home. -Continue famotidine, transition from Nexium -Consider GI consult as outpatient  Nicotine use disorder: Chronic, stable.  1/2 PPD.  -NicoDerm patch 14 mg  daily  EtOH use disorder CIWA 0  6 beer per day reported history. CIWA 0 over past 24 hours.  -Transition CIWA w/o ativan to per shift   Chest pain, ACS ruled out: Resolved. No further chest pain, may have been related to TTP.  - Continue to monitor  Pruritis: Resolved. Likely related to plasmapheresis.  - Continue Atarax as needed  FEN/GI: reg diet PPx: Lovenox  Disposition: Continue plasmapheresis, likely DC by the end of the week  Subjective:  No acute events overnight.  Feeling well, no complaints.  Denies any pruritus, chest pain, or abdominal pains.  Objective: Temp:  [97.7 F (36.5 C)-98.5 F (36.9 C)] 97.7 F (36.5 C) (04/22 0525) Pulse Rate:  [60-79] 60 (04/22 0525) Resp:  [17-25] 18 (04/22 0525) BP: (116-141)/(74-93) 134/93 (04/22 0525) SpO2:  [93 %-100 %] 99 % (04/22 0525) Weight:  [90.6 kg] 90.6 kg (04/22 0525) Physical Exam:  General: Alert, NAD, walking around the room comfortably HEENT: NCAT, MMM  Cardiac: RRR no m/g/r Lungs: Clear bilaterally, no increased WOB  Abdomen: soft, non-tender, non-distended, normoactive BS Msk: Moves all extremities spontaneously  Ext: Warm, dry, 2+ distal pulses, no edema  Skin: No rashes or bruising noted  Laboratory: Recent Labs  Lab 08/15/18 0356 08/15/18 1318 08/16/18 0450 08/17/18 0357  WBC 6.5  --  13.0* 8.7  HGB 9.6* 9.9* 10.1* 9.8*  HCT 29.8* 29.0* 31.6* 30.3*  PLT 29*  --  44* 74*   Recent Labs  Lab 08/14/18 2052 08/15/18 0356  08/16/18 0450 08/16/18 2102 08/17/18 0357  NA 136 137   < > 143 139 142  K 2.9*  3.1*   < > 3.4* 3.2* 3.1*  CL 100 102   < > 103 100 100  CO2 25 27   < > 30 31 32  BUN 13 10   < > 15 16 14   CREATININE 0.94 0.85   < > 0.99 1.20 0.93  CALCIUM 8.9 8.9   < > 9.4 9.6 8.9  PROT 6.8 6.6  --   --   --  5.2*  BILITOT 3.6* 4.1*  --   --   --  1.4*  ALKPHOS 104 100  --   --   --  60  ALT 65* 64*  --   --   --  45*  AST 61* 57*  --   --   --  30  GLUCOSE 110* 159*   < > 125*  312* 125*   < > = values in this interval not displayed.    Imaging/Diagnostic Tests: No results found.  Patriciaann Clan, DO 08/17/2018, 7:07 AM PGY-1, [place] Intern pager: [phone], text pages welcome

## 2018-08-17 NOTE — Progress Notes (Addendum)
HEMATOLOGY-ONCOLOGY PROGRESS NOTE   SUBJECTIVE: Completed 2nd plasmapheresis yesterday. Itching has resolved with addition of IV Pepcid as pre-med. Denies bleeding.  No other complaints this morning.  REVIEW OF SYSTEMS:   Constitutional: Denies fevers, chills or abnormal weight loss Eyes: Denies blurriness of vision Ears, nose, mouth, throat, and face: Denies mucositis or sore throat Respiratory: Denies cough, dyspnea or wheezes Cardiovascular: Denies palpitation, chest discomfort Gastrointestinal:  Denies nausea, heartburn or change in bowel habits Skin: Itching has resolved. No rashes. Lymphatics: Denies new lymphadenopathy or easy bruising Neurological:Denies numbness, tingling or new weaknesses Behavioral/Psych: Mood is stable, no new changes  Extremities: No lower extremity edema All other systems were reviewed with the patient and are negative.  I have reviewed the past medical history, past surgical history, social history and family history with the patient and they are unchanged from previous note.   PHYSICAL EXAMINATION:  Vitals:   08/16/18 1937 08/17/18 0525  BP: (!) 134/92 (!) 134/93  Pulse: 77 60  Resp: 18 18  Temp: 98.4 F (36.9 C) 97.7 F (36.5 C)  SpO2: 96% 99%   Filed Weights   08/15/18 0006 08/16/18 0101 08/17/18 0525  Weight: 200 lb 12.8 oz (91.1 kg) 199 lb 12.8 oz (90.6 kg) 199 lb 12.8 oz (90.6 kg)    GENERAL:alert, no distress and comfortable SKIN: skin color, texture, turgor are normal, no rashes or significant lesions EYES: normal, Conjunctiva are pink and non-injected, sclera clear OROPHARYNX:no exudate, no erythema and lips, buccal mucosa, and tongue normal  NECK: supple, thyroid normal size, non-tender, without nodularity LYMPH:  no palpable lymphadenopathy in the cervical, axillary or inguinal LUNGS: clear to auscultation and percussion with normal breathing effort HEART: regular rate & rhythm and no murmurs and no lower extremity  edema ABDOMEN:abdomen soft, non-tender and normal bowel sounds Musculoskeletal:no cyanosis of digits and no clubbing  NEURO: alert & oriented x 3 with fluent speech, no focal motor/sensory deficits  Plasmapheresis catheter without redness or drainage.  LABORATORY DATA:  I have reviewed the data as listed CMP Latest Ref Rng & Units 08/17/2018 08/16/2018 08/16/2018  Glucose 70 - 99 mg/dL 125(H) 312(H) 125(H)  BUN 6 - 20 mg/dL 14 16 15   Creatinine 0.61 - 1.24 mg/dL 0.93 1.20 0.99  Sodium 135 - 145 mmol/L 142 139 143  Potassium 3.5 - 5.1 mmol/L 3.1(L) 3.2(L) 3.4(L)  Chloride 98 - 111 mmol/L 100 100 103  CO2 22 - 32 mmol/L 32 31 30  Calcium 8.9 - 10.3 mg/dL 8.9 9.6 9.4  Total Protein 6.5 - 8.1 g/dL 5.2(L) - -  Total Bilirubin 0.3 - 1.2 mg/dL 1.4(H) - -  Alkaline Phos 38 - 126 U/L 60 - -  AST 15 - 41 U/L 30 - -  ALT 0 - 44 U/L 45(H) - -    Lab Results  Component Value Date   WBC 8.7 08/17/2018   HGB 9.8 (L) 08/17/2018   HCT 30.3 (L) 08/17/2018   MCV 107.8 (H) 08/17/2018   PLT 74 (L) 08/17/2018   NEUTROABS 3.5 08/17/2018    ASSESSMENT AND PLAN:  1. TTP initially diagnosed in October 2018 with recurrence in April 2020.  Initiation of daily plasma exchange and Solu-Medrol 02/02/2017  [name] Antibody- 72, activity less than 2% on initial presentation; [name] from 08/15/2018 is pending.  Last plasma exchange 02/06/2017  Prednisone 60 mg daily beginning 02/08/2017  Prednisone 40 mg daily beginning 02/17/2017  Prednisone 30 mg daily beginning 03/09/2017  Prednisone 20 mg daily beginning 03/23/2017  Prednisone 10 mg daily beginning 04/01/2017  Prednisone 5 mg daily for 5 days then 5 mg every other day for 5 doses then stop beginning 05/11/2017  Plasma exchange on 08/15/2018. 2. Xiphoid pain-etiology unclear,resolved 3. Hypertension 4. Diabetes 5. Alcohol/Tobacco use 6.History of renal insufficiency 7. Right IJ dialysis catheter placed 02/02/2017, removed   [name] has recurrent TTP.  He is currently on Solu-Medrol and started plasmapheresis on 08/15/2018.  He is completed 2 sessions of plasmapheresis.  His platelets have improved and he has no bleeding.  Hemoglobin is stable at 9.8.  Itching related to plasmapheresis has resolved.  1.  Continue daily plasmapheresis. Will monitor counts closely and consider stopping plasmapheresis when his platelet count normalizes. Anticipate he will need this daily for 5 days.  2.  D/C Solu-medrol. Begin Prednisone 60 mg daily. 3.  Itching better. Continue PRN Benadryl, Prednisone, and IV Pepcid.  4.  We will consider rituximab as an outpatient for his recurrent TTP.  Hematology will continue following him daily and we will arrange for outpatient follow-up.  [name], DNP, AGPCNP-BC, AOCNP  Eric Hooper has completed 2 plasmapheresis procedures.  The platelet count and markers of hemolysis have improved.  He appears to be tolerating the treatment well.  We will begin a steroid taper.  The plan is to continue daily plasma exchange until the platelet count has normalized.  He will then be transition to outpatient therapy.  He will be scheduled for weekly Rituxan as an outpatient beginning next week.  I saw Eric Hooper and discussed the plan.

## 2018-08-17 NOTE — Progress Notes (Signed)
Per Dr. Benay Spice: Needs lab/OV/ 1st Rituxan on 4/28 or 4/30. Scheduling message sent for this and for telephone education session on 08/22/18.

## 2018-08-18 ENCOUNTER — Telehealth: Payer: Self-pay | Admitting: *Deleted

## 2018-08-18 ENCOUNTER — Other Ambulatory Visit: Payer: Self-pay | Admitting: Nurse Practitioner

## 2018-08-18 ENCOUNTER — Other Ambulatory Visit: Payer: Self-pay

## 2018-08-18 ENCOUNTER — Telehealth: Payer: Self-pay | Admitting: Oncology

## 2018-08-18 ENCOUNTER — Other Ambulatory Visit: Payer: Self-pay | Admitting: Oncology

## 2018-08-18 DIAGNOSIS — M311 Thrombotic microangiopathy: Secondary | ICD-10-CM

## 2018-08-18 DIAGNOSIS — M3119 Other thrombotic microangiopathy: Secondary | ICD-10-CM

## 2018-08-18 LAB — COMPREHENSIVE METABOLIC PANEL
ALT: 35 U/L (ref 0–44)
AST: 24 U/L (ref 15–41)
Albumin: 3.3 g/dL — ABNORMAL LOW (ref 3.5–5.0)
Alkaline Phosphatase: 49 U/L (ref 38–126)
Anion gap: 9 (ref 5–15)
BUN: 15 mg/dL (ref 6–20)
CO2: 29 mmol/L (ref 22–32)
Calcium: 9 mg/dL (ref 8.9–10.3)
Chloride: 102 mmol/L (ref 98–111)
Creatinine, Ser: 1.04 mg/dL (ref 0.61–1.24)
GFR calc Af Amer: >60 mL/min (ref >60)
GFR calc non Af Amer: >60 mL/min (ref >60)
Glucose, Bld: 124 mg/dL — ABNORMAL HIGH (ref 70–99)
Potassium: 3.6 mmol/L (ref 3.5–5.1)
Sodium: 140 mmol/L (ref 135–145)
Total Bilirubin: 1.1 mg/dL (ref 0.3–1.2)
Total Protein: 5.4 g/dL — ABNORMAL LOW (ref 6.5–8.1)

## 2018-08-18 LAB — THERAPEUTIC PLASMA EXCHANGE (BLOOD BANK)
Plasma Exchange: 3000
Plasma volume needed: 3000
Unit division: 0
Unit division: 0
Unit division: 0
Unit division: 0
Unit division: 0
Unit division: 0
Unit division: 0
Unit division: 0
Unit division: 0

## 2018-08-18 LAB — CBC WITH DIFFERENTIAL/PLATELET
Abs Immature Granulocytes: 0.06 10*3/uL (ref 0.00–0.07)
Basophils Absolute: 0.1 10*3/uL (ref 0.0–0.1)
Basophils Relative: 0 %
Eosinophils Absolute: 0.5 10*3/uL (ref 0.0–0.5)
Eosinophils Relative: 3 %
HCT: 31.4 % — ABNORMAL LOW (ref 39.0–52.0)
Hemoglobin: 10.3 g/dL — ABNORMAL LOW (ref 13.0–17.0)
Immature Granulocytes: 0 %
Lymphocytes Relative: 30 %
Lymphs Abs: 4.3 10*3/uL — ABNORMAL HIGH (ref 0.7–4.0)
MCH: 35 pg — ABNORMAL HIGH (ref 26.0–34.0)
MCHC: 32.8 g/dL (ref 30.0–36.0)
MCV: 106.8 fL — ABNORMAL HIGH (ref 80.0–100.0)
Monocytes Absolute: 0.9 10*3/uL (ref 0.1–1.0)
Monocytes Relative: 6 %
Neutro Abs: 8.5 10*3/uL — ABNORMAL HIGH (ref 1.7–7.7)
Neutrophils Relative %: 61 %
Platelets: 129 10*3/uL — ABNORMAL LOW (ref 150–400)
RBC: 2.94 MIL/uL — ABNORMAL LOW (ref 4.22–5.81)
RDW: 16.3 % — ABNORMAL HIGH (ref 11.5–15.5)
WBC: 14.3 10*3/uL — ABNORMAL HIGH (ref 4.0–10.5)
nRBC: 0 % (ref 0.0–0.2)

## 2018-08-18 LAB — GLUCOSE, CAPILLARY
Glucose-Capillary: 131 mg/dL — ABNORMAL HIGH (ref 70–99)
Glucose-Capillary: 197 mg/dL — ABNORMAL HIGH (ref 70–99)
Glucose-Capillary: 263 mg/dL — ABNORMAL HIGH (ref 70–99)
Glucose-Capillary: 109 mg/dL — ABNORMAL HIGH (ref 70–99)

## 2018-08-18 LAB — LACTATE DEHYDROGENASE: LDH: 177 U/L (ref 98–192)

## 2018-08-18 MED ORDER — SODIUM CHLORIDE 0.9 % IV SOLN
2.0000 g | INTRAVENOUS | Status: AC
  Administered 2018-08-18: 2 g via INTRAVENOUS
  Filled 2018-08-18: qty 20

## 2018-08-18 MED ORDER — ACD FORMULA A 0.73-2.45-2.2 GM/100ML VI SOLN
500.0000 mL | Status: DC
  Administered 2018-08-18: 13:00:00 500 mL via INTRAVENOUS
  Filled 2018-08-18: qty 500

## 2018-08-18 MED ORDER — DIPHENHYDRAMINE HCL 25 MG PO CAPS
ORAL_CAPSULE | ORAL | Status: AC
  Filled 2018-08-18: qty 1

## 2018-08-18 MED ORDER — ANTICOAGULANT SODIUM CITRATE 4% (200MG/5ML) IV SOLN
5.0000 mL | Freq: Once | Status: DC
  Filled 2018-08-18: qty 5

## 2018-08-18 MED ORDER — ACD FORMULA A 0.73-2.45-2.2 GM/100ML VI SOLN
Status: AC
  Administered 2018-08-18: 500 mL via INTRAVENOUS
  Filled 2018-08-18: qty 500

## 2018-08-18 MED ORDER — ACD FORMULA A 0.73-2.45-2.2 GM/100ML VI SOLN
500.0000 mL | Status: DC
  Filled 2018-08-18: qty 500

## 2018-08-18 MED ORDER — DIPHENHYDRAMINE HCL 25 MG PO CAPS: 25.0000 mg | ORAL_CAPSULE | Freq: Four times a day (QID) | ORAL | Status: DC | PRN

## 2018-08-18 MED ORDER — SODIUM CHLORIDE 0.9 % IV SOLN: 2.0000 g | Freq: Once | INTRAVENOUS | Status: DC

## 2018-08-18 MED ORDER — ANTICOAGULANT SODIUM CITRATE 4% (200MG/5ML) IV SOLN
5.0000 mL | Status: DC
  Filled 2018-08-18 (×2): qty 5

## 2018-08-18 MED ORDER — CALCIUM CARBONATE ANTACID 500 MG PO CHEW
2.0000 | CHEWABLE_TABLET | ORAL | Status: DC
  Administered 2018-08-18: 14:00:00 400 mg via ORAL

## 2018-08-18 MED ORDER — CALCIUM CARBONATE ANTACID 500 MG PO CHEW
CHEWABLE_TABLET | ORAL | Status: AC
  Administered 2018-08-18: 14:00:00 400 mg via ORAL
  Filled 2018-08-18: qty 4

## 2018-08-18 MED ORDER — POTASSIUM CHLORIDE CRYS ER 20 MEQ PO TBCR: 40.0000 meq | EXTENDED_RELEASE_TABLET | Freq: Once | ORAL | Status: DC

## 2018-08-18 MED ORDER — ANTICOAGULANT SODIUM CITRATE 4% (200MG/5ML) IV SOLN
5.0000 mL | Status: AC
  Administered 2018-08-18: 5 mL
  Filled 2018-08-18 (×2): qty 5

## 2018-08-18 MED ORDER — DIPHENHYDRAMINE HCL 25 MG PO CAPS
25.0000 mg | ORAL_CAPSULE | Freq: Four times a day (QID) | ORAL | Status: DC | PRN
  Administered 2018-08-18: 13:00:00 25 mg via ORAL
  Filled 2018-08-18: qty 1

## 2018-08-18 MED ORDER — SODIUM CHLORIDE 0.9 % IV SOLN
4.0000 g | INTRAVENOUS | Status: DC
  Filled 2018-08-18: qty 40

## 2018-08-18 MED ORDER — ACETAMINOPHEN 325 MG PO TABS: 650.0000 mg | ORAL_TABLET | ORAL | Status: DC | PRN

## 2018-08-18 NOTE — Progress Notes (Addendum)
HEMATOLOGY-ONCOLOGY PROGRESS NOTE   SUBJECTIVE: Completed 3rd plasmapheresis yesterday. Tolerated well. No recurrent itching. Reported chest pain when catheter flushed during plasmapheresis X2. No pain when it was flushed with blood draw this am or with fluids infusing. No CP currently. No bleeding. No other complaints today.   REVIEW OF SYSTEMS:   Constitutional: Denies fevers, chills or abnormal weight loss Eyes: Denies blurriness of vision Ears, nose, mouth, throat, and face: Denies mucositis or sore throat Respiratory: Denies cough, dyspnea or wheezes Cardiovascular: Denies palpitation. CP when catheter flushed during plasmapheresis. No CP at this time.  Gastrointestinal:  Denies nausea, heartburn or change in bowel habits Skin: Itching has resolved. No rashes. Lymphatics: Denies new lymphadenopathy or easy bruising Neurological:Denies numbness, tingling or new weaknesses Behavioral/Psych: Mood is stable, no new changes  Extremities: No lower extremity edema All other systems were reviewed with the patient and are negative.  I have reviewed the past medical history, past surgical history, social history and family history with the patient and they are unchanged from previous note.   PHYSICAL EXAMINATION:  Vitals:   08/17/18 2002 08/18/18 0436  BP: (!) 131/98 126/86  Pulse: 83 72  Resp: 18 20  Temp: 98 F (36.7 C) 98.3 F (36.8 C)  SpO2: 98% 99%   Filed Weights   08/16/18 0101 08/17/18 0525 08/18/18 0436  Weight: 199 lb 12.8 oz (90.6 kg) 199 lb 12.8 oz (90.6 kg) 199 lb 1.6 oz (90.3 kg)    GENERAL:alert, no distress and comfortable SKIN: skin color, texture, turgor are normal, no rashes or significant lesions EYES: normal, Conjunctiva are pink and non-injected, sclera clear OROPHARYNX:no exudate, no erythema and lips, buccal mucosa, and tongue normal  NECK: supple, thyroid normal size, non-tender, without nodularity LYMPH:  no palpable lymphadenopathy in the cervical,  axillary or inguinal LUNGS: clear to auscultation and percussion with normal breathing effort HEART: regular rate & rhythm and no murmurs and no lower extremity edema ABDOMEN:abdomen soft, non-tender and normal bowel sounds Musculoskeletal:no cyanosis of digits and no clubbing  NEURO: alert & oriented x 3 with fluent speech, no focal motor/sensory deficits  Plasmapheresis catheter without redness or drainage.  LABORATORY DATA:  I have reviewed the data as listed CMP Latest Ref Rng & Units 08/18/2018 08/17/2018 08/17/2018  Glucose 70 - 99 mg/dL 124(H) 219(H) 125(H)  BUN 6 - 20 mg/dL 15 11 14   Creatinine 0.61 - 1.24 mg/dL 1.04 0.87 0.93  Sodium 135 - 145 mmol/L 140 138 142  Potassium 3.5 - 5.1 mmol/L 3.6 3.7 3.1(L)  Chloride 98 - 111 mmol/L 102 102 100  CO2 22 - 32 mmol/L 29 27 32  Calcium 8.9 - 10.3 mg/dL 9.0 9.4 8.9  Total Protein 6.5 - 8.1 g/dL 5.4(L) - 5.2(L)  Total Bilirubin 0.3 - 1.2 mg/dL 1.1 - 1.4(H)  Alkaline Phos 38 - 126 U/L 49 - 60  AST 15 - 41 U/L 24 - 30  ALT 0 - 44 U/L 35 - 45(H)    Lab Results  Component Value Date   WBC 14.3 (H) 08/18/2018   HGB 10.3 (L) 08/18/2018   HCT 31.4 (L) 08/18/2018   MCV 106.8 (H) 08/18/2018   PLT 129 (L) 08/18/2018   NEUTROABS 8.5 (H) 08/18/2018    ASSESSMENT AND PLAN:  1. TTP initially diagnosed in October 2018 with recurrence in April 2020.  Initiation of daily plasma exchange and Solu-Medrol 02/02/2017  [name] Antibody- 72, activity less than 2% on initial presentation; [name] from 08/15/2018 <2.0.  Last plasma  exchange 02/06/2017  Prednisone 60 mg daily beginning 02/08/2017  Prednisone 40 mg daily beginning 02/17/2017  Prednisone 30 mg daily beginning 03/09/2017  Prednisone 20 mg daily beginning 03/23/2017  Prednisone 10 mg daily beginning 04/01/2017  Prednisone 5 mg daily for 5 days then 5 mg every other day for 5 doses then stop beginning 05/11/2017  Recurrent TTP 08/14/2018  Plasma exchange/steroids started on  08/15/2018. 2. Xiphoid pain-etiology unclear,resolved 3. Hypertension 4. Diabetes 5. Alcohol/Tobacco use 6.History of renal insufficiency 7. Right IJ dialysis catheter placed 02/02/2017, removed   Eric Hooper has recurrent TTP.  He is currently on prednisone and started plasmapheresis on 08/15/2018.  He has completed 3 sessions of plasmapheresis.  His platelets have improved and he has no bleeding.  Hemoglobin improving. LDH has normalized. Itching related to plasmapheresis has resolved.  1.  Continue daily plasmapheresis. Will monitor counts closely and consider stopping plasmapheresis when his platelet count normalizes. Anticipate he will need this daily for 5 days. Recheck Retic count in am. 2.  Continue Prednisone 60 mg daily. 3.  Itching better. Continue PRN Benadryl, Prednisone, and IV Pepcid.  4.  We schedule rituximab as an outpatient for his recurrent TTP.  Hematology will continue following him daily and we will arrange for outpatient follow-up.  Eric Bussing, DNP, AGPCNP-BC, AOCNP Eric Hooper appears unchanged.  The platelet count and hemoglobin have improved.  The LDH and bilirubin have normalized.  He appears to be responding to the current treatment.  We will discontinue plasmapheresis if the platelet count has normalized tomorrow.  He will continue a slow prednisone taper.  He will be scheduled for outpatient plasmapheresis if needed on 08/22/2018.  He will begin weekly rituximab on 08/25/2018.  He will be scheduled for an office visit in the a.m. on 08/22/2018 at the [facility] center.

## 2018-08-18 NOTE — Progress Notes (Signed)
PIV consult: Pt has pigtail in trialysis cath. Central line teaching reviewed with pt and nurse. Assisted Velia, RN with connecting to pigtail. She understands to consult IV team to disconnect from line.

## 2018-08-18 NOTE — Telephone Encounter (Signed)
Called patient regarding appointment for 08/22/18 and that MD needs to see him at 0800 with labs. He states he can come at 0800 on 4/27, but no earlier. Scheduled with MD and will draw lab after MD sees him.

## 2018-08-18 NOTE — Progress Notes (Signed)
Family Medicine Teaching Service Daily Progress Note Intern Pager: [phone]  Patient name: Eric Hooper Medical record number: [id number] Date of birth: 07-21-1975 Age: 43 y.o. Gender: male  Primary Care Provider: Bonnita Hollow, MD Consultants: Heme-Onc Code Status: Full  Pt Overview and Major Events to Date:  4/19 - admitted with chest pain, ACS ruled out. TTP recurrence.  Assessment and Plan: Eric Hooper a 43 y.o.malepresenting with dull chest pain and nosebleeds for 4 days, ultimately found to have recurrent TTP. His past medical history significant for TTP (2018), alcohol use disorder, DMT2, HTN, HLD, GERD.  Recurrent thrombocytopenic purpura, likely hereditary: Improving.  Day 4 of plasmapheresis.  Plts 129 from 74. LDH 177, from 210. Hgb stable at 10.3.  ADAMTS critically low. -Hematology on board: Continue daily plasmapheresis, prednisone 60 mg daily  -Will follow outpatient once discharge, considering rituximab therapy -Monitor CBC, LDH, reticulocyte count  Diabetes, type II: Chronic, stable A1c 8.0 on 06/2018.  CBG ranging low 200s over yesterday. 131 this a.m.  Required 9 units of SSI in the past 24 hours.  Will continue to monitor closely in the setting of prednisone use.  - mSSI -CBGs with meals and nightly - Continue holding home metformin  Chest pain, ACS already ruled out:  Had reoccurrence of chest pains yesterday and this morning, now described as diffuse and "cloudy", specifically related with flushing of his IJ line.  Initially described it as a deep diffuse pain, now this morning says it feels "cloudy "and is not very bothersome.  Lasted for an hour yesterday, resolved on its own.  Feels it is worse with moving around his chest.  Does not feel like his normal GERD.  Seems to be related specifically to IV flushing/plasmapheresis treatments.  - Continue to monitor for symptomatology: Obtain EKG if chest pain recurs with treatment - Start trial of  loratadine 10 mg daily   Hypokalemia: Resolved.  K 3.6 this morning, from 3.1. Received 80 meq K yesterday. Likely related to RBC production. - Will monitor   Hypertension: Chronic, stable.  Systolic normotensive, diastolic intermittently elevated 90-110.  BP 127/72 this morning. - Continue home amlodipine 10 mg daily  GERD: Chronic, stable.  No current symptomatology.  Takes Nexium 40mg  daily at home. -Continue famotidine, transition from Nexium -Consider GI consult as outpatient  Nicotine use disorder: Chronic, stable.  1/2 PPD.  -NicoDerm patch 14 mg daily  EtOH use disorder CIWA 0  6 beer per day reported history. CIWA 0 over past 24 hours.  -d/c CIWA   Pruritis: Resolved. Likely related to plasmapheresis.  - Continue Atarax as needed  FEN/GI: reg diet PPx: Lovenox-we will touch base with heme-onc  Disposition: Continue plasmapheresis, likely DC by the end of the week  Subjective:  No acute events overnight.  Describes diffuse chest pain that lasted for an hour, immediately started after flushing of his IJ line and additionally occurred this morning after another flush. Initially described it as a deep diffuse pain, now this morning says it feels "cloudy "and is not very bothersome.  Feels it is worse with moving around his chest.  Does not feel like his normal GERD. Went away without intervention. Feels it may be a little worse when he moves his body.  Not too bothersome this morning, but felt intense after it first occurred yesterday.  Other than this, he is feeling good without any additional complaints.  Denies any pruritus, shortness of breath, diaphoresis, bruising.  Objective: Temp:  [97.5 F (36.4 C)-98.6  F (37 C)] 98.3 F (36.8 C) (04/23 0436) Pulse Rate:  [61-97] 72 (04/23 0436) Resp:  [18-26] 20 (04/23 0436) BP: (118-175)/(77-111) 126/86 (04/23 0436) SpO2:  [98 %-100 %] 99 % (04/23 0436) Weight:  [90.3 kg] 90.3 kg (04/23 0436) Physical Exam:  General:  Alert, NAD, laying comfortably, smiling and interactive HEENT: NCAT, MMM, oropharynx nonerythematous  Cardiac: RRR no m/g/r, chest wall slightly tender to palpation Lungs: Clear bilaterally, no increased WOB  Abdomen: soft, non-tender, non-distended, normoactive BS Msk: Moves all extremities spontaneously  Ext: Warm, dry, 2+ distal pulses, no edema  Skin: No rashes or bruising noted  Laboratory: Recent Labs  Lab 08/16/18 0450 08/16/18 1639 08/17/18 0357 08/18/18 0313  WBC 13.0*  --  8.7 14.3*  HGB 10.1* 9.2* 9.8* 10.3*  HCT 31.6* 27.0* 30.3* 31.4*  PLT 44*  --  74* 129*   Recent Labs  Lab 08/15/18 0356  08/17/18 0357 08/17/18 1840 08/18/18 0313  NA 137   < > 142 138 140  K 3.1*   < > 3.1* 3.7 3.6  CL 102   < > 100 102 102  CO2 27   < > 32 27 29  BUN 10   < > 14 11 15   CREATININE 0.85   < > 0.93 0.87 1.04  CALCIUM 8.9   < > 8.9 9.4 9.0  PROT 6.6  --  5.2*  --  5.4*  BILITOT 4.1*  --  1.4*  --  1.1  ALKPHOS 100  --  60  --  49  ALT 64*  --  45*  --  35  AST 57*  --  30  --  24  GLUCOSE 159*   < > 125* 219* 124*   < > = values in this interval not displayed.    Imaging/Diagnostic Tests: No results found.  Patriciaann Clan, DO 08/18/2018, 6:51 AM PGY-1, [place] Intern pager: [phone], text pages welcome

## 2018-08-18 NOTE — Telephone Encounter (Signed)
Spoke with patient re appointments for 4/27 and 4/30 scheduled based on schedule messages from 4/22 and 4/23. No available to coordinate all appointments for lab/port/fu/1st chemo all on 4/28 or 4/30.

## 2018-08-18 NOTE — Progress Notes (Signed)
Patient describes chest discomfort that  is not very bothersome secondary to central line flushes per pt statement .  EKG was completed and placed in chart. MD paged.

## 2018-08-19 LAB — THERAPEUTIC PLASMA EXCHANGE (BLOOD BANK)
Plasma Exchange: 3429
Plasma volume needed: 3429
Unit division: 0
Unit division: 0
Unit division: 0
Unit division: 0
Unit division: 0
Unit division: 0
Unit division: 0
Unit division: 0
Unit division: 0

## 2018-08-19 LAB — RETICULOCYTES
Immature Retic Fract: 23.1 % — ABNORMAL HIGH (ref 2.3–15.9)
RBC.: 2.83 MIL/uL — ABNORMAL LOW (ref 4.22–5.81)
Retic Count, Absolute: 232.9 10*3/uL — ABNORMAL HIGH (ref 19.0–186.0)
Retic Ct Pct: 8.2 % — ABNORMAL HIGH (ref 0.4–3.1)

## 2018-08-19 LAB — ADAMTS13 ANTIBODY: ADAMTS13 Antibody: 75 Units/mL — ABNORMAL HIGH (ref <12)

## 2018-08-19 LAB — POCT I-STAT EG7
Acid-Base Excess: 2 mmol/L (ref 0.0–2.0)
Bicarbonate: 27.7 mmol/L (ref 20.0–28.0)
Calcium, Ion: 1.22 mmol/L (ref 1.15–1.40)
HCT: 30 % — ABNORMAL LOW (ref 39.0–52.0)
Hemoglobin: 10.2 g/dL — ABNORMAL LOW (ref 13.0–17.0)
O2 Saturation: 64 %
Potassium: 4 mmol/L (ref 3.5–5.1)
Sodium: 139 mmol/L (ref 135–145)
TCO2: 29 mmol/L (ref 22–32)
pCO2, Ven: 45.4 mmHg (ref 44.0–60.0)
pH, Ven: 7.394 (ref 7.250–7.430)
pO2, Ven: 34 mmHg (ref 32.0–45.0)

## 2018-08-19 LAB — CBC WITH DIFFERENTIAL/PLATELET
Abs Immature Granulocytes: 0.04 10*3/uL (ref 0.00–0.07)
Basophils Absolute: 0.1 10*3/uL (ref 0.0–0.1)
Basophils Relative: 0 %
Eosinophils Absolute: 0.5 10*3/uL (ref 0.0–0.5)
Eosinophils Relative: 4 %
HCT: 30.6 % — ABNORMAL LOW (ref 39.0–52.0)
Hemoglobin: 9.8 g/dL — ABNORMAL LOW (ref 13.0–17.0)
Immature Granulocytes: 0 %
Lymphocytes Relative: 39 %
Lymphs Abs: 4.6 10*3/uL — ABNORMAL HIGH (ref 0.7–4.0)
MCH: 34.6 pg — ABNORMAL HIGH (ref 26.0–34.0)
MCHC: 32 g/dL (ref 30.0–36.0)
MCV: 108.1 fL — ABNORMAL HIGH (ref 80.0–100.0)
Monocytes Absolute: 0.7 10*3/uL (ref 0.1–1.0)
Monocytes Relative: 6 %
Neutro Abs: 5.8 10*3/uL (ref 1.7–7.7)
Neutrophils Relative %: 51 %
Platelets: 189 10*3/uL (ref 150–400)
RBC: 2.83 MIL/uL — ABNORMAL LOW (ref 4.22–5.81)
RDW: 15.4 % (ref 11.5–15.5)
WBC: 11.7 10*3/uL — ABNORMAL HIGH (ref 4.0–10.5)
nRBC: 0 % (ref 0.0–0.2)

## 2018-08-19 LAB — BASIC METABOLIC PANEL
Anion gap: 16 — ABNORMAL HIGH (ref 5–15)
BUN: 14 mg/dL (ref 6–20)
CO2: 24 mmol/L (ref 22–32)
Calcium: 8.5 mg/dL — ABNORMAL LOW (ref 8.9–10.3)
Chloride: 100 mmol/L (ref 98–111)
Creatinine, Ser: 0.91 mg/dL (ref 0.61–1.24)
GFR calc Af Amer: >60 mL/min (ref >60)
GFR calc non Af Amer: >60 mL/min (ref >60)
Glucose, Bld: 327 mg/dL — ABNORMAL HIGH (ref 70–99)
Potassium: 3.7 mmol/L (ref 3.5–5.1)
Sodium: 140 mmol/L (ref 135–145)

## 2018-08-19 LAB — COMPREHENSIVE METABOLIC PANEL
ALT: 34 U/L (ref 0–44)
AST: 20 U/L (ref 15–41)
Albumin: 3.1 g/dL — ABNORMAL LOW (ref 3.5–5.0)
Alkaline Phosphatase: 51 U/L (ref 38–126)
Anion gap: 8 (ref 5–15)
BUN: 15 mg/dL (ref 6–20)
CO2: 30 mmol/L (ref 22–32)
Calcium: 8.9 mg/dL (ref 8.9–10.3)
Chloride: 103 mmol/L (ref 98–111)
Creatinine, Ser: 0.89 mg/dL (ref 0.61–1.24)
GFR calc Af Amer: >60 mL/min (ref >60)
GFR calc non Af Amer: >60 mL/min (ref >60)
Glucose, Bld: 136 mg/dL — ABNORMAL HIGH (ref 70–99)
Potassium: 3.1 mmol/L — ABNORMAL LOW (ref 3.5–5.1)
Sodium: 141 mmol/L (ref 135–145)
Total Bilirubin: 0.9 mg/dL (ref 0.3–1.2)
Total Protein: 5.2 g/dL — ABNORMAL LOW (ref 6.5–8.1)

## 2018-08-19 LAB — GLUCOSE, CAPILLARY
Glucose-Capillary: 115 mg/dL — ABNORMAL HIGH (ref 70–99)
Glucose-Capillary: 197 mg/dL — ABNORMAL HIGH (ref 70–99)

## 2018-08-19 LAB — ADAMTS13 ACTIVITY: Adamts 13 Activity: <2 % — CL (ref >66.8)

## 2018-08-19 LAB — LACTATE DEHYDROGENASE: LDH: 144 U/L (ref 98–192)

## 2018-08-19 MED ORDER — CALCIUM CARBONATE ANTACID 500 MG PO CHEW
2.0000 | CHEWABLE_TABLET | ORAL | Status: AC
  Administered 2018-08-19: 400 mg via ORAL

## 2018-08-19 MED ORDER — DIPHENHYDRAMINE HCL 25 MG PO CAPS
25.0000 mg | ORAL_CAPSULE | Freq: Four times a day (QID) | ORAL | Status: DC | PRN
  Administered 2018-08-19: 14:00:00 25 mg via ORAL
  Filled 2018-08-19 (×2): qty 1

## 2018-08-19 MED ORDER — PREDNISONE 20 MG PO TABS: 60.0000 mg | ORAL_TABLET | Freq: Every day | ORAL | 0 refills | Status: DC

## 2018-08-19 MED ORDER — ANTICOAGULANT SODIUM CITRATE 4% (200MG/5ML) IV SOLN
5.0000 mL | Status: AC
  Administered 2018-08-19: 5 mL
  Filled 2018-08-19: qty 5

## 2018-08-19 MED ORDER — ACD FORMULA A 0.73-2.45-2.2 GM/100ML VI SOLN
Status: AC
  Administered 2018-08-19: 16:00:00
  Filled 2018-08-19: qty 500

## 2018-08-19 MED ORDER — ACETAMINOPHEN 325 MG PO TABS: 650.0000 mg | ORAL_TABLET | ORAL | Status: DC | PRN

## 2018-08-19 MED ORDER — CALCIUM CARBONATE ANTACID 500 MG PO CHEW
CHEWABLE_TABLET | ORAL | Status: AC
  Filled 2018-08-19: qty 4

## 2018-08-19 MED ORDER — GUAIFENESIN-DM 100-10 MG/5ML PO SYRP
5.0000 mL | ORAL_SOLUTION | ORAL | Status: DC | PRN
  Administered 2018-08-19: 13:00:00 5 mL via ORAL
  Filled 2018-08-19: qty 5

## 2018-08-19 MED ORDER — ACETAMINOPHEN 500 MG PO TABS: 500.0000 mg | ORAL_TABLET | Freq: Four times a day (QID) | ORAL | 0 refills | Status: DC | PRN

## 2018-08-19 MED ORDER — SODIUM CHLORIDE 0.9 % IV SOLN
2.0000 g | INTRAVENOUS | Status: AC
  Administered 2018-08-19: 2 g via INTRAVENOUS
  Filled 2018-08-19: qty 20

## 2018-08-19 MED ORDER — FAMOTIDINE 20 MG PO TABS
20.0000 mg | ORAL_TABLET | Freq: Every day | ORAL | Status: DC
  Administered 2018-08-19: 20 mg via ORAL
  Filled 2018-08-19: qty 1

## 2018-08-19 MED ORDER — ACD FORMULA A 0.73-2.45-2.2 GM/100ML VI SOLN
500.0000 mL | Status: AC
  Filled 2018-08-19: qty 500

## 2018-08-19 MED ORDER — POTASSIUM CHLORIDE CRYS ER 20 MEQ PO TBCR
40.0000 meq | EXTENDED_RELEASE_TABLET | Freq: Two times a day (BID) | ORAL | Status: DC
  Administered 2018-08-19 (×2): 40 meq via ORAL
  Filled 2018-08-19 (×2): qty 2

## 2018-08-19 MED ORDER — HYDROXYZINE HCL 25 MG PO TABS: 25.0000 mg | ORAL_TABLET | Freq: Four times a day (QID) | ORAL | 0 refills | Status: DC | PRN

## 2018-08-19 NOTE — Progress Notes (Signed)
Transportation here to take patient to HD.

## 2018-08-19 NOTE — Progress Notes (Addendum)
Called Hemodialysis and spoke with [facility]. Pt will be picked up for HD in 30 minutes. Patient requested for itching medications. Benadryl  Capsule 25 mg and hydroxyzine 25 mg PRN given per pt request.  When medications were brought to pts room pt stated that : "Why are they giving me Benadryl capsule 25 mg instead of liquid benadryl they know this stuff don't work for me, I am going to sue this hospital, I am sick of being here". I spoke with pt regarding liquid form of benadryl and that I could page the MD for an order. Patient stated : "No just give it, who knows how long it will take".  Patient took PRN medications. Questions and concerns were answered.

## 2018-08-19 NOTE — Progress Notes (Signed)
Family Medicine Teaching Service Daily Progress Note Intern Pager: [phone]  Patient name: Eric Hooper Medical record number: [id number] Date of birth: [birth year] Age: 43 y.o. Gender: male  Primary Care Provider: Bonnita Hollow, MD Consultants: Heme-Onc Code Status: Full  Pt Overview and Major Events to Date:  4/19 - admitted with chest pain, ACS ruled out. TTP recurrence.  Assessment and Plan: Eric Hooper a 43 y.o.malepresenting with dull chest pain and nosebleeds for 4 days, ultimately found to have recurrent TTP. His past medical history significant for TTP (2018), alcohol use disorder, DMT2, HTN, HLD, GERD.  Recurrent thrombocytopenic purpura, likely hereditary: Improving.  Day 5 of plasmapheresis.  Platelets 189, LDH normalized.  Hemoglobin stable at 9.8.  -Hematology on board: Continue plasmapheresis (5/5 days), prednisone 60 mg daily  - Request IJ stay in place, will see on Monday in case of need for plasmapheresis  -Will follow outpatient once discharge, starting rituximab therapy in 1 week -Monitor CBC, LDH, reticulocyte count  Diabetes, type II: Chronic, stable A1c 8.0 on 06/2018.  CBG 100- 200 on average.  Required 11 units of SSI past 24 hours. - mSSI -CBGs with meals and nightly - Continue holding home metformin, will restart at discharge  Chest pain, ACS already ruled out: Resolved. Denies any further episodes with plasmapheresis yesterday. EKG on 4/23 during treatment NSR without ischemic changes.  -Continue to monitor  Hypokalemia: Recurrent. K3.1 this a.m., from 3.6 yesterday.  Likely related to RBC production.   - Kdur 40 meq x2  Hypertension: Chronic, stable.  Normotensive. - Continue home amlodipine 10 mg daily  GERD: Chronic, stable.  No current symptomatology.  Takes Nexium 40mg  daily at home. -Continue famotidine, transition from Nexium -Consider GI consult as outpatient  Nicotine use disorder: Chronic, stable.  1/2 PPD.  -NicoDerm  patch 14 mg daily  EtOH use disorder CIWA 0  6 beer per day reported history. CIWA 0 over past 24 hours.  -d/c CIWA   Pruritis: Resolved. Likely related to plasmapheresis.  - Continue Atarax as needed  FEN/GI: reg diet PPx: Lovenox  Disposition: Likely DC home this afternoon after plasmapheresis  Subjective:  Doing well this morning, no complaints.  Ready to go home.  Denies any further chest pains after flushes of his IJ yesterday with plasmapheresis.  Thinks it may have just been related to how fast it was flushed the day prior.  Denies any weakness/tingling, abdominal pains, bruising.  Had a normal BM yesterday.  Objective: Temp:  [97.3 F (36.3 C)-98.4 F (36.9 C)] 97.3 F (36.3 C) (04/24 0503) Pulse Rate:  [73-101] 74 (04/24 0503) Resp:  [13-25] 18 (04/24 0503) BP: (112-130)/(72-94) 117/94 (04/24 0503) SpO2:  [96 %-100 %] 100 % (04/24 0503) Weight:  [90.4 kg] 90.4 kg (04/24 0503) Physical Exam:  General: Alert, NAD HEENT: NCAT, MMM, oropharynx nonerythematous  Cardiac: RRR no m/g/r Lungs: Clear bilaterally, no increased WOB  Abdomen: soft,non-distended, normoactive BS Msk: Moves all extremities spontaneously  Ext: Warm, dry, 2+ distal pulses, no edema  Skin: No bruising noted   Laboratory: Recent Labs  Lab 08/17/18 0357 08/18/18 0313 08/18/18 1356 08/19/18 0430  WBC 8.7 14.3*  --  11.7*  HGB 9.8* 10.3* 10.2* 9.8*  HCT 30.3* 31.4* 30.0* 30.6*  PLT 74* 129*  --  189   Recent Labs  Lab 08/17/18 0357 08/17/18 1840 08/18/18 0313 08/18/18 1356 08/19/18 0430  NA 142 138 140 139 141  K 3.1* 3.7 3.6 4.0 3.1*  CL 100 102 102  --  103  CO2 32 27 29  --  30  BUN 14 11 15   --  15  CREATININE 0.93 0.87 1.04  --  0.89  CALCIUM 8.9 9.4 9.0  --  8.9  PROT 5.2*  --  5.4*  --  5.2*  BILITOT 1.4*  --  1.1  --  0.9  ALKPHOS 60  --  49  --  51  ALT 45*  --  35  --  34  AST 30  --  24  --  20  GLUCOSE 125* 219* 124*  --  136*    Imaging/Diagnostic Tests: No  results found.  Patriciaann Clan, DO 08/19/2018, 8:42 AM PGY-1, [place] Intern pager: [phone], text pages welcome

## 2018-08-19 NOTE — Discharge Summary (Signed)
[facility] Discharge Summary  Patient name: Eric Hooper Medical record number: [id number] Date of birth: [birth year] Age: 43 y.o. Gender: male Date of Admission: 08/14/2018  Date of Discharge: 4/24 Admitting Physician: Leeanne Rio, MD  Primary Care Provider: Bonnita Hollow, MD Consultants: Heme/onc   Indication for Hospitalization: TTP/CP   Discharge Diagnoses/Problem List:  Recurrent thrombocytopenic purpura, likely hereditary Chest pain, resolved Type 2 diabetes Chronic hypertension GERD Nicotine use disorder Alcohol use disorder  Disposition: Home  Discharge Condition: Stable  Discharge Exam:  General: Alert, NAD HEENT: NCAT, MMM, oropharynx nonerythematous  Cardiac: RRR no m/g/r Lungs: Clear bilaterally, no increased WOB  Abdomen: soft,non-distended, normoactive BS Msk: Moves all extremities spontaneously  Ext: Warm, dry, 2+ distal pulses, no edema  Skin: No bruising noted  Brief Hospital Course:  Eric Hooper is a 43 year old gentleman with a history significant for previous TTP in 2018, HTN, tobacco use, and DM who presented with a 4-day history of nosebleeds and dull chest pain, found to be in TTP.  Thrombocytopenic purpura:  Initial labs: plts 38, hgb 10.1, LDH 774, Tbili 3.6.  Peripheral smear showing schistocytes. DAT negative.  Heme/onc was consulted who recommended course of plasmapheresis and steroid taper (discharged onto prednisone 36m daily).  He received 2 U FFP and plasmapheresis treatments via right IJ from 4/20 to 4/24.  His platelets/hemolytic anemia were responsive to therapy, plts 189 at discharge. [name] level critically low <2, suggesting hereditary component.  He will follow-up with hematology outpatient, to start rituximab therapy long-term.  Chest pain: Resolved.  Reported dull, burning central chest pain with left shoulder radiation. ACS ruled out with flat troponins and EKG NSR without ischemic changes.   CXR wnl.  Echo EF 55-60% without wall motion abnormalities. Felt to be associated with TTP, as he reported similar symptoms during his last episode.  No further chest pain at discharge.  Issues for Follow Up:  1. Please ensure he follows up with hematology, right IJ left in place at DC for possible plasmapheresis per heme-onc on 4/27 appointment.  Will likely be starting rituximab soon. 2. Please monitor glucose/diabetes while on steroids.  CBGs ranging 100-200 on DC, just restarted on his home metformin. 3. Ensure no further episodes of chest pain.  Significant Procedures: Plasmapheresis 4/20-4/24  Significant Labs and Imaging:  Recent Labs  Lab 08/17/18 0357 08/18/18 0313 08/18/18 1356 08/19/18 0430  WBC 8.7 14.3*  --  11.7*  HGB 9.8* 10.3* 10.2* 9.8*  HCT 30.3* 31.4* 30.0* 30.6*  PLT 74* 129*  --  189   Recent Labs  Lab 08/14/18 2052 08/15/18 0356  08/16/18 2102 08/17/18 0357 08/17/18 1840 08/18/18 0313 08/18/18 1356 08/19/18 0430  NA 136 137   < > 139 142 138 140 139 141  K 2.9* 3.1*   < > 3.2* 3.1* 3.7 3.6 4.0 3.1*  CL 100 102   < > 100 100 102 102  --  103  CO2 25 27   < > 31 32 27 29  --  30  GLUCOSE 110* 159*   < > 312* 125* 219* 124*  --  136*  BUN 13 10   < > _0 --  15  CREATININE 0.94 0.85   < > 1.20 0.93 0.87 1.04  --  0.89  CALCIUM 8.9 8.9   < > 9.6 8.9 9.4 9.0  --  8.9  ALKPHOS 104 100  --   --  60  --  49  --  51  AST 61* 57*  --   --  30  --  24  --  20  ALT 65* 64*  --   --  45*  --  35  --  34  ALBUMIN 4.1 4.0  --   --  3.3*  --  3.3*  --  3.1*   < > = values in this interval not displayed.   Dg Chest Port 1 View  Result Date: 08/15/2018 CLINICAL DATA:  Central line placement EXAM: PORTABLE CHEST 1 VIEW COMPARISON:  08/14/2018 FINDINGS: Right IJ central venous catheter tip is in the lower SVC. No focal airspace consolidation or pulmonary edema. No pneumothorax or pleural effusion. Normal cardiomediastinal contours. IMPRESSION: Right IJ CVC  tip in the lower SVC.  No pneumothorax. Electronically Signed   By: Ulyses Jarred M.D.   On: 08/15/2018 03:19   Dg Chest Portable 1 View  Result Date: 08/14/2018 CLINICAL DATA:  Chest pain EXAM: PORTABLE CHEST 1 VIEW COMPARISON:  12/21/2017 FINDINGS: Heart and mediastinal contours are within normal limits. No focal opacities or effusions. No acute bony abnormality. IMPRESSION: No active disease. Electronically Signed   By: Rolm Baptise M.D.   On: 08/14/2018 20:32   Results/Tests Pending at Time of Discharge: None   Discharge Medications:  Allergies as of 08/19/2018      Reactions   Ibuprofen Other (See Comments)   Drops platelets/ thrombocytopenia      Medication List    STOP taking these medications   benzonatate 100 MG capsule Commonly known as:  TESSALON     TAKE these medications   acetaminophen 500 MG tablet Commonly known as:  TYLENOL Take 1 tablet (500 mg total) by mouth every 6 (six) hours as needed (pain). What changed:    how much to take  when to take this   amLODipine 10 MG tablet Commonly known as:  NORVASC Take 1 tablet (10 mg total) by mouth daily.   atorvastatin 40 MG tablet Commonly known as:  LIPITOR TAKE 1 TABLET BY MOUTH EVERY DAY   blood glucose meter kit and supplies Kit Dispense based on patient and insurance preference. Use up to four times daily as directed. (FOR ICD-9 250.00, 250.01).   esomeprazole 40 MG capsule Commonly known as:  NEXIUM Take 1 capsule (40 mg total) by mouth 2 (two) times daily before a meal.   hydrOXYzine 25 MG tablet Commonly known as:  ATARAX/VISTARIL Take 1 tablet (25 mg total) by mouth every 6 (six) hours as needed for itching.   metFORMIN 1000 MG tablet Commonly known as:  GLUCOPHAGE Take 1 tablet (1,000 mg total) by mouth daily with breakfast.   predniSONE 20 MG tablet Commonly known as:  DELTASONE Take 3 tablets (60 mg total) by mouth daily with breakfast.       Discharge Instructions: Please refer to  Patient Instructions section of EMR for full details.  Patient was counseled important signs and symptoms that should prompt return to medical care, changes in medications, dietary instructions, activity restrictions, and follow up appointments.   Follow-Up Appointments: Follow-up Information    Ladell Pier, MD. Go on 08/22/2018.   Specialty:  Oncology Why:  8:45 am.  Contact information: [place] [address] [phone]        [facility]. Call on 08/23/2018.   Specialty:  Family Medicine Why:  VIRTUAL VISIT at 9:10am, the visit will be done over the phone and/or  video. You will receive a call from a provider from our office at this time for your hospital follow up.  Contact information: [address] [id number] [place] [address] [facility], [place], DO 08/19/2018, 4:02 PM PGY-1, [place]

## 2018-08-19 NOTE — Progress Notes (Addendum)
HEMATOLOGY-ONCOLOGY PROGRESS NOTE   SUBJECTIVE: Completed 4th plasmapheresis yesterday. Tolerated well. No recurrent itching. No bleeding. Mentions that his abdomen feels "full". Bowels moving. States that he has felt this way "for a long time." No other complaints today.   REVIEW OF SYSTEMS:   Constitutional: Denies fevers, chills or abnormal weight loss Eyes: Denies blurriness of vision Ears, nose, mouth, throat, and face: Denies mucositis or sore throat Respiratory: Denies cough, dyspnea or wheezes Cardiovascular: Denies palpitation and chest pain. Gastrointestinal:  Denies nausea, heartburn or change in bowel habits. Abdomen "feels full" Skin: Itching has resolved. No rashes. Lymphatics: Denies new lymphadenopathy or easy bruising Neurological:Denies numbness, tingling or new weaknesses Behavioral/Psych: Mood is stable, no new changes  Extremities: No lower extremity edema All other systems were reviewed with the patient and are negative.  I have reviewed the past medical history, past surgical history, social history and family history with the patient and they are unchanged from previous note.   PHYSICAL EXAMINATION:  Vitals:   08/18/18 1929 08/19/18 0503  BP: 124/81 (!) 117/94  Pulse: 73 74  Resp:  18  Temp:  (!) 97.3 F (36.3 C)  SpO2: 98% 100%   Filed Weights   08/17/18 0525 08/18/18 0436 08/19/18 0503  Weight: 199 lb 12.8 oz (90.6 kg) 199 lb 1.6 oz (90.3 kg) 199 lb 3.2 oz (90.4 kg)    GENERAL:alert, no distress and comfortable SKIN: skin color, texture, turgor are normal, no rashes or significant lesions EYES: normal, Conjunctiva are pink and non-injected, sclera clear OROPHARYNX:no exudate, no erythema and lips, buccal mucosa, and tongue normal  NECK: supple, thyroid normal size, non-tender, without nodularity LYMPH:  no palpable lymphadenopathy in the cervical, axillary or inguinal LUNGS: clear to auscultation and percussion with normal breathing effort HEART:  regular rate & rhythm and no murmurs and no lower extremity edema ABDOMEN:abdomen soft, non-tender and normal bowel sounds Musculoskeletal:no cyanosis of digits and no clubbing  NEURO: alert & oriented x 3 with fluent speech, no focal motor/sensory deficits  Plasmapheresis catheter without redness or drainage.  LABORATORY DATA:  I have reviewed the data as listed CMP Latest Ref Rng & Units 08/19/2018 08/18/2018 08/18/2018  Glucose 70 - 99 mg/dL 136(H) - 124(H)  BUN 6 - 20 mg/dL 15 - 15  Creatinine 0.61 - 1.24 mg/dL 0.89 - 1.04  Sodium 135 - 145 mmol/L 141 139 140  Potassium 3.5 - 5.1 mmol/L 3.1(L) 4.0 3.6  Chloride 98 - 111 mmol/L 103 - 102  CO2 22 - 32 mmol/L 30 - 29  Calcium 8.9 - 10.3 mg/dL 8.9 - 9.0  Total Protein 6.5 - 8.1 g/dL 5.2(L) - 5.4(L)  Total Bilirubin 0.3 - 1.2 mg/dL 0.9 - 1.1  Alkaline Phos 38 - 126 U/L 51 - 49  AST 15 - 41 U/L 20 - 24  ALT 0 - 44 U/L 34 - 35    Lab Results  Component Value Date   WBC 11.7 (H) 08/19/2018   HGB 9.8 (L) 08/19/2018   HCT 30.6 (L) 08/19/2018   MCV 108.1 (H) 08/19/2018   PLT 189 08/19/2018   NEUTROABS 5.8 08/19/2018    ASSESSMENT AND PLAN:  1. TTP initially diagnosed in October 2018 with recurrence in April 2020.  Initiation of daily plasma exchange and Solu-Medrol 02/02/2017  [name] Antibody- 72, activity less than 2% on initial presentation; [name] from 08/15/2018 <2.0.  Last plasma exchange 02/06/2017  Prednisone 60 mg daily beginning 02/08/2017  Prednisone 40 mg daily beginning 02/17/2017  Prednisone  30 mg daily beginning 03/09/2017  Prednisone 20 mg daily beginning 03/23/2017  Prednisone 10 mg daily beginning 04/01/2017  Prednisone 5 mg daily for 5 days then 5 mg every other day for 5 doses then stop beginning 05/11/2017  Recurrent TTP 08/14/2018  Plasma exchange/steroids started on 08/15/2018. 2. Xiphoid pain-etiology unclear,resolved 3. Hypertension 4. Diabetes 5. Alcohol/Tobacco use 6.History of renal  insufficiency 7. Right IJ dialysis catheter placed 02/02/2017, removed   Mr. Reidel has recurrent TTP.  He is currently on prednisone and started plasmapheresis on 08/15/2018.  He has completed 4 sessions of plasmapheresis.  His platelets have improved and he has no bleeding.  Hemoglobin stable. LDH has normalized. Itching related to plasmapheresis has resolved.  1.  Proceed with plasmapheresis today as scheduled. Patient may discharge home later today after plasmapheresis. Leave IJ catheter in place upon discharge.  2.  Continue Prednisone 60 mg daily. Will consider tapering this next week after rechecking counts. 3.  Itching better. Continue PRN Benadryl, Prednisone, and IV Pepcid. May use the Benadryl and oral Pepcid OTC as needed as outpatient for recurrent itching.  4.  F/U Monday as scheduled for labs and visit. May need plasmapheresis as outpatient on Monday if platelets drop again. Plan to begin rituximab as an outpatient for his recurrent TTP on 08/24/3028.  [name], DNP, AGPCNP-BC, AOCNP  Mr. Norwood appears stable.  The platelet count, LDH, and bilirubin have normalized.  He has persistent anemia and reticulocytosis.  He will complete plasmapheresis #5 today.  He can be discharged home after the plasmapheresis today.  He will continue prednisone at a dose of 60 mg daily. We will see him in the a.m. on 08/22/2018 at the [facility] center for a lab and office visit.  We will begin a slow prednisone taper if the markers of hemolysis remain improved.  He will be scheduled for outpatient plasmapheresis on 08/22/2018 in the case this is needed.  The plan is to begin weekly rituximab on 08/25/2018.  Please call hematology as needed.

## 2018-08-19 NOTE — Progress Notes (Signed)
Pt received A&o,  With HD cath, Dressing D/C/I. gave Discharge paper. 1938 PT discharge. Wife picked him, up.

## 2018-08-19 NOTE — TOC Transition Note (Signed)
Transition of Care [facility]) - CM/SW Discharge Note   Patient Details  Name: Eric Hooper MRN: [id number] Date of Birth: 1975/10/13  [facility] CM/SW Contact:  Sherrilyn Rist Phone Number: [phone] 08/19/2018, 9:30 AM   Clinical Narrative:    Patient is for dc home today with IJ catheter in place; follow up Monday in case of need for plasmapheresis.   Final next level of care: Home/Self Care Barriers to Discharge: No Barriers Identified   Patient Goals and CMS Choice Patient states their goals for this hospitalization and ongoing recovery are:: to go home CMS Medicare.gov Compare Post Acute Care list provided to:: Patient Choice offered to / list presented to : NA  Discharge Placement                       Discharge Plan and Services In-house Referral: NA Discharge Planning Services: CM Consult Post Acute Care Choice: NA          DME Arranged: NIV DME Agency: NA       HH Arranged: NA HH Agency: NA        Social Determinants of Health (SDOH) Interventions     Readmission Risk Interventions No flowsheet data found.

## 2018-08-19 NOTE — Discharge Instructions (Signed)
Thank you so much for allowing [place] to take part of your care at [facility] were admitted for new onset chest pains and nosebleeds.  During your stay, you were found to have a recurrent thrombocytopenic purpura.  Additionally, we monitored labs and the electrical function of your heart which ruled out a heart attack causing your chest pain.  You received daily plasmapheresis and steroids, with this your platelets have returned back to a normal level.  Please make sure you follow-up with your primary care provider and hematologist.  Please return to care if you have a recurrence of bleeding, easy bruising, chest pains, or shortness of breath.

## 2018-08-20 LAB — THERAPEUTIC PLASMA EXCHANGE (BLOOD BANK)
Plasma Exchange: 3500
Plasma volume needed: 3500
Unit division: 0
Unit division: 0
Unit division: 0
Unit division: 0
Unit division: 0
Unit division: 0

## 2018-08-22 ENCOUNTER — Inpatient Hospital Stay: Payer: Commercial Managed Care - PPO

## 2018-08-22 ENCOUNTER — Other Ambulatory Visit: Payer: Commercial Managed Care - PPO

## 2018-08-22 ENCOUNTER — Ambulatory Visit: Payer: Commercial Managed Care - PPO | Admitting: Nurse Practitioner

## 2018-08-22 ENCOUNTER — Other Ambulatory Visit: Payer: Self-pay

## 2018-08-22 ENCOUNTER — Inpatient Hospital Stay: Payer: Commercial Managed Care - PPO | Attending: Oncology | Admitting: Oncology

## 2018-08-22 VITALS — BP 143/92 | HR 79 | Temp 98.8°F | Resp 18 | Ht 70.0 in | Wt 202.8 lb

## 2018-08-22 DIAGNOSIS — Z4901 Encounter for fitting and adjustment of extracorporeal dialysis catheter: Secondary | ICD-10-CM | POA: Diagnosis not present

## 2018-08-22 DIAGNOSIS — Z5112 Encounter for antineoplastic immunotherapy: Secondary | ICD-10-CM | POA: Insufficient documentation

## 2018-08-22 DIAGNOSIS — Z7984 Long term (current) use of oral hypoglycemic drugs: Secondary | ICD-10-CM

## 2018-08-22 DIAGNOSIS — M311 Thrombotic microangiopathy: Secondary | ICD-10-CM | POA: Diagnosis not present

## 2018-08-22 DIAGNOSIS — I10 Essential (primary) hypertension: Secondary | ICD-10-CM | POA: Insufficient documentation

## 2018-08-22 DIAGNOSIS — M3119 Other thrombotic microangiopathy: Secondary | ICD-10-CM

## 2018-08-22 DIAGNOSIS — E119 Type 2 diabetes mellitus without complications: Secondary | ICD-10-CM | POA: Diagnosis not present

## 2018-08-22 LAB — CBC WITH DIFFERENTIAL (CANCER CENTER ONLY)
Abs Immature Granulocytes: 0.05 10*3/uL (ref 0.00–0.07)
Basophils Absolute: 0.1 10*3/uL (ref 0.0–0.1)
Basophils Relative: 1 %
Eosinophils Absolute: 0.1 10*3/uL (ref 0.0–0.5)
Eosinophils Relative: 1 %
HCT: 34.9 % — ABNORMAL LOW (ref 39.0–52.0)
Hemoglobin: 11.2 g/dL — ABNORMAL LOW (ref 13.0–17.0)
Immature Granulocytes: 1 %
Lymphocytes Relative: 18 %
Lymphs Abs: 1.7 10*3/uL (ref 0.7–4.0)
MCH: 34.5 pg — ABNORMAL HIGH (ref 26.0–34.0)
MCHC: 32.1 g/dL (ref 30.0–36.0)
MCV: 107.4 fL — ABNORMAL HIGH (ref 80.0–100.0)
Monocytes Absolute: 0.4 10*3/uL (ref 0.1–1.0)
Monocytes Relative: 4 %
Neutro Abs: 7.5 10*3/uL (ref 1.7–7.7)
Neutrophils Relative %: 75 %
Platelet Count: 344 10*3/uL (ref 150–400)
RBC: 3.25 MIL/uL — ABNORMAL LOW (ref 4.22–5.81)
RDW: 14 % (ref 11.5–15.5)
WBC Count: 9.8 10*3/uL (ref 4.0–10.5)
nRBC: 0 % (ref 0.0–0.2)

## 2018-08-22 LAB — LACTATE DEHYDROGENASE: LDH: 166 U/L (ref 98–192)

## 2018-08-22 LAB — CMP (CANCER CENTER ONLY)
ALT: 112 U/L — ABNORMAL HIGH (ref 0–44)
AST: 32 U/L (ref 15–41)
Albumin: 3.5 g/dL (ref 3.5–5.0)
Alkaline Phosphatase: 84 U/L (ref 38–126)
Anion gap: 11 (ref 5–15)
BUN: 12 mg/dL (ref 6–20)
CO2: 23 mmol/L (ref 22–32)
Calcium: 8.3 mg/dL — ABNORMAL LOW (ref 8.9–10.3)
Chloride: 106 mmol/L (ref 98–111)
Creatinine: 0.98 mg/dL (ref 0.61–1.24)
GFR, Est AFR Am: >60 mL/min (ref >60)
GFR, Estimated: >60 mL/min (ref >60)
Glucose, Bld: 387 mg/dL — ABNORMAL HIGH (ref 70–99)
Potassium: 3.7 mmol/L (ref 3.5–5.1)
Sodium: 140 mmol/L (ref 135–145)
Total Bilirubin: 0.8 mg/dL (ref 0.3–1.2)
Total Protein: 6 g/dL — ABNORMAL LOW (ref 6.5–8.1)

## 2018-08-22 MED ORDER — SODIUM CHLORIDE 0.9% FLUSH
10.0000 mL | INTRAVENOUS | Status: DC | PRN
  Administered 2018-08-22: 10 mL via INTRAVENOUS
  Filled 2018-08-22: qty 10

## 2018-08-22 MED ORDER — HEPARIN SOD (PORK) LOCK FLUSH 100 UNIT/ML IV SOLN
250.0000 [IU] | Freq: Once | INTRAVENOUS | Status: AC
  Administered 2018-08-22: 500 [IU] via INTRAVENOUS
  Filled 2018-08-22: qty 5

## 2018-08-22 NOTE — Progress Notes (Signed)
Per direction of IV nurse, Rosalio Macadamia, RN on 08/19/18: OK to use purple lumen for IV infusions and lab draw. Drew labs from purple port after 10 ml waste. Excellent blood return obtained. Flushed afterwards with NS 10 ml and 2.5 ml of Heparin 100 units/ml. Provided patient with printed handout on Rituxan.

## 2018-08-22 NOTE — Patient Instructions (Signed)
Decrease you Prednisone to 40 mg by mouth daily. AVOID concentrated sweets Check your blood sugar twice daily and call if over 400

## 2018-08-22 NOTE — Progress Notes (Signed)
Eric Hooper PROGRESS NOTE   Diagnosis: TTP  INTERVAL HISTORY:   Eric Hooper was admitted 08/14/2018 with recurrent TTP.  He was treated with steroids and plasma exchange.  He completed 5 plasma exchange treatments, last on 08/19/2018.  He was discharged on 08/19/2018 and continues prednisone at a dose of 60 mg daily.  He reports the highest blood sugar reading since discharge from the hospital has been 200.  He is taking metformin. No fever, chest pain, or bleeding.  Objective:  Vital signs in last 24 hours:  Blood pressure (!) 143/92, pulse 79, temperature 98.8 F (37.1 C), temperature source Oral, resp. rate 18, height 5\' 10"  (1.778 m), weight 202 lb 12.8 oz (92 kg), SpO2 100 %.    HEENT: No thrush Vascular: No leg edema   Dialysis catheter site without evidence of infection Lab Results:  Lab Results  Component Value Date   WBC 9.8 08/22/2018   HGB 11.2 (L) 08/22/2018   HCT 34.9 (L) 08/22/2018   MCV 107.4 (H) 08/22/2018   PLT 344 08/22/2018   NEUTROABS 7.5 08/22/2018    CMP  Lab Results  Component Value Date   NA 140 08/22/2018   K 3.7 08/22/2018   CL 106 08/22/2018   CO2 23 08/22/2018   GLUCOSE 387 (H) 08/22/2018   BUN 12 08/22/2018   CREATININE 0.98 08/22/2018   CALCIUM 8.3 (L) 08/22/2018   PROT 6.0 (L) 08/22/2018   ALBUMIN 3.5 08/22/2018   AST 32 08/22/2018   ALT 112 (H) 08/22/2018   ALKPHOS 84 08/22/2018   BILITOT 0.8 08/22/2018   GFRNONAA >60 08/22/2018   GFRAA >60 08/22/2018     Medications: I have reviewed the patient's current medications.   Assessment/Plan:  1. TTP initially diagnosed in October 2018 with recurrence in April 2020.  Initiation of daily plasma exchange and Solu-Medrol 02/02/2017  [name] Antibody- 72, activity less than 2% on initial presentation; [name] from 08/15/2018 <2.0.  Last plasma exchange 02/06/2017  Prednisone 60 mg daily beginning 02/08/2017  Prednisone 40 mg daily beginning 02/17/2017   Prednisone 30 mg daily beginning 03/09/2017  Prednisone 20 mg daily beginning 03/23/2017  Prednisone 10 mg daily beginning 04/01/2017  Prednisone 5 mg daily for 5 days then 5 mg every other day for 5 doses then stop beginning 05/11/2017  Recurrent TTP 08/14/2018  Plasma exchange/steroids started on 08/15/2018.  Plasma exchange discontinued after treatment on 08/19/2019, discharged on prednisone 60mg  daily  Prednisone taper to 40 mg daily 08/22/2018 2. Xiphoid pain-etiology unclear,resolved 3. Hypertension 4. Diabetes 5. Alcohol/Tobacco use 6.History of renal insufficiency 7. Right IJ dialysis catheter placed 02/02/2017, removed  New right IJ dialysis catheter 08/15/2018    Disposition: Eric Hooper has been diagnosed with relapse of TTP.  He has again entered remission with plasma exchange and steroids.  He will continue a slow prednisone taper.  The plan is to complete a course of rituximab over the next month.  He will complete a first treatment with rituximab on 08/25/2018. We reviewed potential toxicities associated with rituximab while he was in the hospital last week and again today.  We specifically discussed the chance of an allergic reaction, pneumonitis, CNS toxicity, and hematologic toxicity.  The blood sugar is elevated this morning.  I recommended he follow a diabetic diet and monitor the blood sugar twice daily.  He will contact us for a blood sugar reading of greater than 400.  We will repeat labs when he is here on 08/25/2018.  He  will be scheduled for Hooper visit and week #2 rituximab on 09/01/2018.  The dialysis catheter will be removed within the next few weeks if he remains in remission from the TTP.    Eric Coder, MD  08/22/2018  2:09 PM

## 2018-08-23 ENCOUNTER — Telehealth: Payer: Self-pay | Admitting: Nurse Practitioner

## 2018-08-23 ENCOUNTER — Telehealth (INDEPENDENT_AMBULATORY_CARE_PROVIDER_SITE_OTHER): Payer: Commercial Managed Care - PPO | Admitting: Family Medicine

## 2018-08-23 DIAGNOSIS — M311 Thrombotic microangiopathy: Secondary | ICD-10-CM | POA: Diagnosis not present

## 2018-08-23 DIAGNOSIS — M3119 Other thrombotic microangiopathy: Secondary | ICD-10-CM

## 2018-08-23 NOTE — Progress Notes (Signed)
[place] Telemedicine Visit  Patient consented to have virtual visit. Method of visit: Video  Encounter participants: Patient: Eric Hooper - located at home Provider: Benay Pike - located at [facility] Others (if applicable): None  Chief Complaint: Hospital follow-up for chest pain and TTP  HPI:  No more chest pain. Patient is not having bleeding, bruising, SOB, cough, headache.  He feels much better.  He went to the hematologist yesterday and has another appointment for Thursday.  He said if the Thursday appointment goes well he can have his catheter removed.    ROS: per HPI  Pertinent PMHx: recurrent TTP.    Exam:  Respiratory: able to speak full sentences without difficulty.  No cough.    Assessment/Plan:  TTP (thrombotic thrombocytopenic purpura) (HCC) - hospital followup.  Not having chest pain, bruising, bleeding since discharge.  Platelets from yesterday are back up to 344.  Creatinine is < 1. He has a followup appointment on 4/30 with the hematologist, who is managing his TTP.   - will need to schedule an in-person follow up when COVID restrictions are lifted.  - patient continues to be on prednisone taper.  He is testing his blood glucose at home.  Yesterday morning was 187.  He has not measured it yet this morning. He is to contact his hematologist for readings over 400.      Time spent during visit with patient: 7 minutes

## 2018-08-23 NOTE — Telephone Encounter (Signed)
Scheduled appt per 4/27 los. °

## 2018-08-23 NOTE — Assessment & Plan Note (Signed)
-   hospital followup.  Not having chest pain, bruising, bleeding since discharge.  Platelets from yesterday are back up to 344.  Creatinine is < 1. He has a followup appointment on 4/30 with the hematologist, who is managing his TTP.   - will need to schedule an in-person follow up when COVID restrictions are lifted.  - patient continues to be on prednisone taper.  He is testing his blood glucose at home.  Yesterday morning was 187.  He has not measured it yet this morning. He is to contact his hematologist for readings over 400.

## 2018-08-24 ENCOUNTER — Encounter: Payer: Self-pay | Admitting: *Deleted

## 2018-08-25 ENCOUNTER — Other Ambulatory Visit: Payer: Self-pay

## 2018-08-25 ENCOUNTER — Inpatient Hospital Stay: Payer: Commercial Managed Care - PPO

## 2018-08-25 ENCOUNTER — Ambulatory Visit (HOSPITAL_BASED_OUTPATIENT_CLINIC_OR_DEPARTMENT_OTHER): Payer: Commercial Managed Care - PPO | Admitting: Medical

## 2018-08-25 ENCOUNTER — Other Ambulatory Visit: Payer: Self-pay | Admitting: *Deleted

## 2018-08-25 ENCOUNTER — Telehealth: Payer: Self-pay | Admitting: *Deleted

## 2018-08-25 VITALS — BP 127/77 | HR 60 | Temp 98.5°F | Resp 16

## 2018-08-25 DIAGNOSIS — Z5112 Encounter for antineoplastic immunotherapy: Secondary | ICD-10-CM | POA: Diagnosis not present

## 2018-08-25 DIAGNOSIS — R748 Abnormal levels of other serum enzymes: Secondary | ICD-10-CM | POA: Diagnosis not present

## 2018-08-25 DIAGNOSIS — T8090XA Unspecified complication following infusion and therapeutic injection, initial encounter: Secondary | ICD-10-CM | POA: Diagnosis not present

## 2018-08-25 DIAGNOSIS — Z95828 Presence of other vascular implants and grafts: Secondary | ICD-10-CM

## 2018-08-25 DIAGNOSIS — M3119 Other thrombotic microangiopathy: Secondary | ICD-10-CM

## 2018-08-25 DIAGNOSIS — M311 Thrombotic microangiopathy: Secondary | ICD-10-CM

## 2018-08-25 DIAGNOSIS — R740 Nonspecific elevation of levels of transaminase and lactic acid dehydrogenase [LDH]: Secondary | ICD-10-CM

## 2018-08-25 LAB — CBC WITH DIFFERENTIAL (CANCER CENTER ONLY)
Abs Immature Granulocytes: 0.05 10*3/uL (ref 0.00–0.07)
Basophils Absolute: 0.1 10*3/uL (ref 0.0–0.1)
Basophils Relative: 1 %
Eosinophils Absolute: 0.2 10*3/uL (ref 0.0–0.5)
Eosinophils Relative: 2 %
HCT: 34.6 % — ABNORMAL LOW (ref 39.0–52.0)
Hemoglobin: 11.6 g/dL — ABNORMAL LOW (ref 13.0–17.0)
Immature Granulocytes: 0 %
Lymphocytes Relative: 44 %
Lymphs Abs: 6 10*3/uL — ABNORMAL HIGH (ref 0.7–4.0)
MCH: 34.3 pg — ABNORMAL HIGH (ref 26.0–34.0)
MCHC: 33.5 g/dL (ref 30.0–36.0)
MCV: 102.4 fL — ABNORMAL HIGH (ref 80.0–100.0)
Monocytes Absolute: 1 10*3/uL (ref 0.1–1.0)
Monocytes Relative: 8 %
Neutro Abs: 5.9 10*3/uL (ref 1.7–7.7)
Neutrophils Relative %: 45 %
Platelet Count: 184 10*3/uL (ref 150–400)
RBC: 3.38 MIL/uL — ABNORMAL LOW (ref 4.22–5.81)
RDW: 13.3 % (ref 11.5–15.5)
WBC Count: 13.2 10*3/uL — ABNORMAL HIGH (ref 4.0–10.5)
nRBC: 0 % (ref 0.0–0.2)

## 2018-08-25 LAB — CMP (CANCER CENTER ONLY)
ALT: 129 U/L — ABNORMAL HIGH (ref 0–44)
AST: 26 U/L (ref 15–41)
Albumin: 3.6 g/dL (ref 3.5–5.0)
Alkaline Phosphatase: 90 U/L (ref 38–126)
Anion gap: 9 (ref 5–15)
BUN: 16 mg/dL (ref 6–20)
CO2: 23 mmol/L (ref 22–32)
Calcium: 9.2 mg/dL (ref 8.9–10.3)
Chloride: 107 mmol/L (ref 98–111)
Creatinine: 0.91 mg/dL (ref 0.61–1.24)
GFR, Est AFR Am: >60 mL/min (ref >60)
GFR, Estimated: >60 mL/min (ref >60)
Glucose, Bld: 221 mg/dL — ABNORMAL HIGH (ref 70–99)
Potassium: 3.6 mmol/L (ref 3.5–5.1)
Sodium: 139 mmol/L (ref 135–145)
Total Bilirubin: 1.5 mg/dL — ABNORMAL HIGH (ref 0.3–1.2)
Total Protein: 6.1 g/dL — ABNORMAL LOW (ref 6.5–8.1)

## 2018-08-25 LAB — LACTATE DEHYDROGENASE: LDH: 223 U/L — ABNORMAL HIGH (ref 98–192)

## 2018-08-25 MED ORDER — DIPHENHYDRAMINE HCL 50 MG/ML IJ SOLN
25.0000 mg | Freq: Once | INTRAMUSCULAR | Status: AC | PRN
  Administered 2018-08-25: 25 mg via INTRAVENOUS

## 2018-08-25 MED ORDER — ANTICOAGULANT SODIUM CITRATE 4% (200MG/5ML) IV SOLN
5.0000 mL | Freq: Once | Status: DC
  Filled 2018-08-25: qty 5

## 2018-08-25 MED ORDER — DIPHENHYDRAMINE HCL 25 MG PO CAPS
50.0000 mg | ORAL_CAPSULE | Freq: Once | ORAL | Status: AC
  Administered 2018-08-25: 50 mg via ORAL

## 2018-08-25 MED ORDER — SODIUM CHLORIDE 0.9% FLUSH
10.0000 mL | INTRAVENOUS | Status: DC | PRN
  Filled 2018-08-25: qty 10

## 2018-08-25 MED ORDER — METHYLPREDNISOLONE SODIUM SUCC 125 MG IJ SOLR
125.0000 mg | Freq: Once | INTRAMUSCULAR | Status: AC | PRN
  Administered 2018-08-25: 125 mg via INTRAVENOUS

## 2018-08-25 MED ORDER — HEPARIN SOD (PORK) LOCK FLUSH 100 UNIT/ML IV SOLN
250.0000 [IU] | Freq: Once | INTRAVENOUS | Status: AC | PRN
  Administered 2018-08-25: 18:00:00 250 [IU]
  Filled 2018-08-25: qty 5

## 2018-08-25 MED ORDER — BLOOD GLUCOSE MONITOR KIT: PACK | 0 refills | Status: DC

## 2018-08-25 MED ORDER — SODIUM CHLORIDE 0.9 % IV SOLN
Freq: Once | INTRAVENOUS | Status: AC
  Administered 2018-08-25: 11:00:00 via INTRAVENOUS
  Filled 2018-08-25: qty 250

## 2018-08-25 MED ORDER — ACETAMINOPHEN 325 MG PO TABS
650.0000 mg | ORAL_TABLET | Freq: Once | ORAL | Status: AC
  Administered 2018-08-25: 650 mg via ORAL

## 2018-08-25 MED ORDER — FAMOTIDINE IN NACL 20-0.9 MG/50ML-% IV SOLN
20.0000 mg | Freq: Once | INTRAVENOUS | Status: AC | PRN
  Administered 2018-08-25: 20 mg via INTRAVENOUS

## 2018-08-25 MED ORDER — DIPHENHYDRAMINE HCL 25 MG PO CAPS
ORAL_CAPSULE | ORAL | Status: AC
  Filled 2018-08-25: qty 2

## 2018-08-25 MED ORDER — ANTICOAGULANT SODIUM CITRATE 4% (200MG/5ML) IV SOLN
5.0000 mL | Freq: Once | Status: AC
  Administered 2018-08-25: 1.2 mL via INTRAVENOUS
  Filled 2018-08-25: qty 5

## 2018-08-25 MED ORDER — SODIUM CHLORIDE 0.9 % IV SOLN
375.0000 mg/m2 | Freq: Once | INTRAVENOUS | Status: AC
  Administered 2018-08-25: 800 mg via INTRAVENOUS
  Filled 2018-08-25: qty 50

## 2018-08-25 MED ORDER — SODIUM CHLORIDE 0.9% FLUSH
10.0000 mL | INTRAVENOUS | Status: DC | PRN
  Administered 2018-08-25: 10 mL
  Filled 2018-08-25: qty 10

## 2018-08-25 MED ORDER — ACETAMINOPHEN 325 MG PO TABS
ORAL_TABLET | ORAL | Status: AC
  Filled 2018-08-25: qty 2

## 2018-08-25 NOTE — Progress Notes (Signed)
Notified patient of his lab results today and MD needs labs repeated tomorrow at 0915. Explained rationale for repeat labs. Continue same prednisone dose.

## 2018-08-25 NOTE — Patient Instructions (Signed)
[employer] [facility] Discharge Instructions for Patients Receiving Chemotherapy  Today you received the following chemotherapy agents Rituxan  To help prevent nausea and vomiting after your treatment, we encourage you to take your nausea medication as prescribed.   If you develop nausea and vomiting that is not controlled by your nausea medication, call the clinic.   BELOW ARE SYMPTOMS THAT SHOULD BE REPORTED IMMEDIATELY:  *FEVER GREATER THAN 100.5 F  *CHILLS WITH OR WITHOUT FEVER  NAUSEA AND VOMITING THAT IS NOT CONTROLLED WITH YOUR NAUSEA MEDICATION  *UNUSUAL SHORTNESS OF BREATH  *UNUSUAL BRUISING OR BLEEDING  TENDERNESS IN MOUTH AND THROAT WITH OR WITHOUT PRESENCE OF ULCERS  *URINARY PROBLEMS  *BOWEL PROBLEMS  UNUSUAL RASH Items with * indicate a potential emergency and should be followed up as soon as possible.  Feel free to call the clinic should you have any questions or concerns. The clinic phone number is [phone].  Please show the [facility] at check-in to the Emergency Department and triage nurse.  Rituximab injection(Rituxan) What is this medicine? RITUXIMAB (ri TUX i mab) is a monoclonal antibody. It is used to treat certain types of cancer like non-Hodgkin lymphoma and chronic lymphocytic leukemia. It is also used to treat rheumatoid arthritis, granulomatosis with polyangiitis (or Wegener's granulomatosis), microscopic polyangiitis, and pemphigus vulgaris. This medicine may be used for other purposes; ask your health care provider or pharmacist if you have questions. COMMON BRAND NAME(S): Rituxan What should I tell my health care provider before I take this medicine? They need to know if you have any of these conditions: -heart disease -infection (especially a virus infection such as hepatitis B, chickenpox, cold sores, or herpes) -immune system problems -irregular heartbeat -kidney disease -low blood counts, like low white cell, platelet,  or red cell counts -lung or breathing disease, like asthma -recently received or scheduled to receive a vaccine -an unusual or allergic reaction to rituximab, other medicines, foods, dyes, or preservatives -pregnant or trying to get pregnant -breast-feeding How should I use this medicine? This medicine is for infusion into a vein. It is administered in a hospital or clinic by a specially trained health care professional. A special MedGuide will be given to you by the pharmacist with each prescription and refill. Be sure to read this information carefully each time. Talk to your pediatrician regarding the use of this medicine in children. This medicine is not approved for use in children. Overdosage: If you think you have taken too much of this medicine contact a poison control center or emergency room at once. NOTE: This medicine is only for you. Do not share this medicine with others. What if I miss a dose? It is important not to miss a dose. Call your doctor or health care professional if you are unable to keep an appointment. What may interact with this medicine? -cisplatin -live virus vaccines This list may not describe all possible interactions. Give your health care provider a list of all the medicines, herbs, non-prescription drugs, or dietary supplements you use. Also tell them if you smoke, drink alcohol, or use illegal drugs. Some items may interact with your medicine. What should I watch for while using this medicine? Your condition will be monitored carefully while you are receiving this medicine. You may need blood work done while you are taking this medicine. This medicine can cause serious allergic reactions. To reduce your risk you may need to take medicine before treatment with this medicine. Take your medicine as directed.  In some patients, this medicine may cause a serious brain infection that may cause death. If you have any problems seeing, thinking, speaking, walking, or  standing, tell your healthcare professional right away. If you cannot reach your healthcare professional, urgently seek other source of medical care. Call your doctor or health care professional for advice if you get a fever, chills or sore throat, or other symptoms of a cold or flu. Do not treat yourself. This drug decreases your body's ability to fight infections. Try to avoid being around people who are sick. Do not become pregnant while taking this medicine or for 12 months after stopping it. Women should inform their doctor if they wish to become pregnant or think they might be pregnant. There is a potential for serious side effects to an unborn child. Talk to your health care professional or pharmacist for more information. Do not breast-feed an infant while taking this medicine or for 6 months after stopping it. What side effects may I notice from receiving this medicine? Side effects that you should report to your doctor or health care professional as soon as possible: -allergic reactions like skin rash, itching or hives; swelling of the face, lips, or tongue -breathing problems -chest pain -changes in vision -diarrhea -headache with fever, neck stiffness, sensitivity to light, nausea, or confusion -fast, irregular heartbeat -loss of memory -low blood counts - this medicine may decrease the number of white blood cells, red blood cells and platelets. You may be at increased risk for infections and bleeding. -mouth sores -problems with balance, talking, or walking -redness, blistering, peeling or loosening of the skin, including inside the mouth -signs of infection - fever or chills, cough, sore throat, pain or difficulty passing urine -signs and symptoms of kidney injury like trouble passing urine or change in the amount of urine -signs and symptoms of liver injury like dark yellow or brown urine; general ill feeling or flu-like symptoms; light-colored stools; loss of appetite; nausea;  right upper belly pain; unusually weak or tired; yellowing of the eyes or skin -signs and symptoms of low blood pressure like dizziness; feeling faint or lightheaded, falls; unusually weak or tired -stomach pain -swelling of the ankles, feet, hands -unusual bleeding or bruising -vomiting Side effects that usually do not require medical attention (report to your doctor or health care professional if they continue or are bothersome): -headache -joint pain -muscle cramps or muscle pain -nausea -tiredness This list may not describe all possible side effects. Call your doctor for medical advice about side effects. You may report side effects to FDA at 1-800-FDA-1088. Where should I keep my medicine? This drug is given in a hospital or clinic and will not be stored at home. NOTE: This sheet is a summary. It may not cover all possible information. If you have questions about this medicine, talk to your doctor, pharmacist, or health care provider.  2019 Elsevier/Gold Standard (2017-03-26 13:04:32)

## 2018-08-25 NOTE — Progress Notes (Signed)
PT has a hemodialysis catheter and we aren't able to access so labs were drawn peripherally.

## 2018-08-25 NOTE — Telephone Encounter (Signed)
Called to follow up on his call to answering service on 08/23/18. Had several high CBG readings [phone]) but none over 400. Still on Prednisone 40 mg daily. Instructed Maria to continue prednisone at current dose and MD will advise as to the taper. Call if CBG is > 400 and avoid concentrated sweets. They are coming in today for Rituxan.

## 2018-08-25 NOTE — Progress Notes (Signed)
DATE:  08/25/2018                                          X  CHEMO/IMMUNOTHERAPY REACTION            MD:  Dr. Dominica Severin B. Sherrill   AGENT/BLOOD PRODUCT RECEIVING TODAY:               Rituximab   AGENT/BLOOD PRODUCT RECEIVING IMMEDIATELY PRIOR TO REACTION:           Rituximab   VS: BP:      139/91   P:        72        SPO2:        100% on room air T: 98.6                  REACTION(S):            Difficulty swallowing at a rate of Rituxan at 200 mg/h   PREMEDS:      Tylenol and Benadryl   INTERVENTION: The patient was initially given Pepcid 20 mg IV x1 with resolution of his symptoms.  His rituximab was restarted at 150 mg/h.  The patient then had a recurrence of difficulty swallowing.  Rituximab was paused.  He was given Solu-Medrol 125 mg IV x1 with resolution of his symptoms.  Rituximab was again restarted 150 mg/h.  The patient had a recurrence of difficulty swallowing.  He was seen with Dr. Benay Spice.  He was given Benadryl 25 mg IV with resolution of his symptoms.  Rituximab was restarted at 100 mg/h and was able to tolerate this rate.  Thus far he has had no recurrence of difficulty swallowing.  He did report that he felt that his mouth is dry.  He was given water to drink as requested.  He continues to do well at this point.  A fingerstick blood glucose returned at 222.  No intervention was indicated.   Review of Systems  Review of Systems  Constitutional: Negative for chills, diaphoresis and fever.  HENT: Positive for trouble swallowing. Negative for voice change.   Respiratory: Negative for cough, chest tightness, shortness of breath and wheezing.   Cardiovascular: Negative for chest pain and palpitations.  Gastrointestinal: Negative for abdominal pain, constipation, diarrhea, nausea and vomiting.  Musculoskeletal: Negative for back pain and myalgias.  Neurological: Negative for dizziness, light-headedness and headaches.     Physical Exam  Physical Exam Constitutional:    General: He is not in acute distress.    Appearance: He is not diaphoretic.  HENT:     Head: Normocephalic and atraumatic.  Eyes:     General: No scleral icterus.       Right eye: No discharge.        Left eye: No discharge.     Conjunctiva/sclera: Conjunctivae normal.  Cardiovascular:     Rate and Rhythm: Normal rate and regular rhythm.     Heart sounds: Normal heart sounds. No murmur. No friction rub. No gallop.   Pulmonary:     Effort: Pulmonary effort is normal. No respiratory distress.     Breath sounds: Normal breath sounds. No wheezing or rales.  Skin:    General: Skin is warm and dry.     Findings: No erythema or rash.  Neurological:     Mental Status: He is alert.  Psychiatric:        Mood and Affect: Mood normal.        Behavior: Behavior normal.        Thought Content: Thought content normal.        Judgment: Judgment normal.     OUTCOME:                   Sandi Mealy, MHS, PA-C  This was a shared visit with Sandi Mealy.  Mr. Aldrete was seen in the chemotherapy room and he developed difficulty swallowing during the rituximab infusion.  He was medicated with Solu-Medrol, Pepcid, and additional Benadryl.  He was able to resume the rituximab infusion without recurrence of the swallowing symptoms.  Labs from today found the LDH and bilirubin to be mildly elevated.  The hemoglobin is stable, but the platelets are lower in the normal range.  He will return for labs tomorrow to be sure he does not have persistent hemolysis.  We will arrange for plasma exchange on 08/26/2018 as indicated.  An a pheresis catheter remains in place.  Julieanne Manson, MD

## 2018-08-25 NOTE — Progress Notes (Signed)
Per Dr Benay Spice ok to tx with ALT 129 and bilirubin 1.5  @1330  pt c/o "having trouble swallowing" no other symptoms.  Pt was receiving 1st tx Rituxan at 200mg . Infusion was stopped, NS hung to gravity and hypersensitivity protocol was started. Sandi Mealy PA to infusion room to assess pt. VS as charted in flowsheets and Pepcid given according to [facility].   @1342  pt states that symptoms have resolved.    Per Sandi Mealy ok to restart restarted Rituxan at 1352 at 150mg  rate of 75ml/hr.  Pt c/o "having trouble swallowing" again at 1356. Infusion stopped NS hung to gravity. Sandi Mealy PA to tx area to assess. Solumedrol 125mg  given as charted on MAR, VS as charted in flowsheets.  Per Sandi Mealy PA ok to rechallenge at 150mg ,  rate of 62 ml/hr. @ 1424 restarted Rituxan at same dose.  @ 1437 stopped pt c/o "having trouble swallowing". Infusion stopped NS hung to gravity and [employer] PA notified. VS as charted in flowsheets.  25 mg benadryl given as charted in MAR  CBG to be checked in 30 minutes per Dr Benay Spice orders entered. Kaitlyn NT notified.   rechallenged at 1505 at 100mg  per Sandi Mealy PA rate of 41 mL/hr.   Report given to Pryor Curia.

## 2018-08-25 NOTE — Telephone Encounter (Signed)
Pt mother calling for a refill of the glucose meter kit and supplies. Please send to [facility] on [address]. Ottis Stain, CMA

## 2018-08-25 NOTE — Progress Notes (Signed)
MD reviewed labs today. Needs labs on Monday to be sure he is not hemolyzing. High priority scheduling message sent.

## 2018-08-26 ENCOUNTER — Other Ambulatory Visit: Payer: Self-pay | Admitting: Nurse Practitioner

## 2018-08-26 ENCOUNTER — Non-Acute Institutional Stay (HOSPITAL_COMMUNITY)
Admission: AD | Admit: 2018-08-26 | Discharge: 2018-08-26 | Disposition: A | Discharge status: Home / Self Care | Payer: Commercial Managed Care - PPO | Source: Ambulatory Visit | Attending: Oncology | Admitting: Oncology

## 2018-08-26 ENCOUNTER — Inpatient Hospital Stay: Payer: Commercial Managed Care - PPO | Attending: Oncology

## 2018-08-26 ENCOUNTER — Other Ambulatory Visit: Payer: Self-pay | Admitting: Oncology

## 2018-08-26 ENCOUNTER — Other Ambulatory Visit: Payer: Self-pay

## 2018-08-26 DIAGNOSIS — K219 Gastro-esophageal reflux disease without esophagitis: Secondary | ICD-10-CM | POA: Diagnosis not present

## 2018-08-26 DIAGNOSIS — E782 Mixed hyperlipidemia: Secondary | ICD-10-CM | POA: Insufficient documentation

## 2018-08-26 DIAGNOSIS — E119 Type 2 diabetes mellitus without complications: Secondary | ICD-10-CM | POA: Diagnosis not present

## 2018-08-26 DIAGNOSIS — M3119 Other thrombotic microangiopathy: Secondary | ICD-10-CM

## 2018-08-26 DIAGNOSIS — Z7952 Long term (current) use of systemic steroids: Secondary | ICD-10-CM | POA: Insufficient documentation

## 2018-08-26 DIAGNOSIS — Z7984 Long term (current) use of oral hypoglycemic drugs: Secondary | ICD-10-CM | POA: Diagnosis not present

## 2018-08-26 DIAGNOSIS — Z79899 Other long term (current) drug therapy: Secondary | ICD-10-CM | POA: Insufficient documentation

## 2018-08-26 DIAGNOSIS — M311 Thrombotic microangiopathy: Secondary | ICD-10-CM | POA: Insufficient documentation

## 2018-08-26 DIAGNOSIS — F17210 Nicotine dependence, cigarettes, uncomplicated: Secondary | ICD-10-CM | POA: Insufficient documentation

## 2018-08-26 DIAGNOSIS — I10 Essential (primary) hypertension: Secondary | ICD-10-CM | POA: Insufficient documentation

## 2018-08-26 DIAGNOSIS — Z5112 Encounter for antineoplastic immunotherapy: Secondary | ICD-10-CM | POA: Insufficient documentation

## 2018-08-26 LAB — POCT I-STAT EG7
Bicarbonate: 24.6 mmol/L (ref 20.0–28.0)
Calcium, Ion: 1.16 mmol/L (ref 1.15–1.40)
HCT: 32 % — ABNORMAL LOW (ref 39.0–52.0)
Hemoglobin: 10.9 g/dL — ABNORMAL LOW (ref 13.0–17.0)
O2 Saturation: 72 %
Potassium: 4.1 mmol/L (ref 3.5–5.1)
Sodium: 138 mmol/L (ref 135–145)
TCO2: 26 mmol/L (ref 22–32)
pCO2, Ven: 39.7 mmHg — ABNORMAL LOW (ref 44.0–60.0)
pH, Ven: 7.4 (ref 7.250–7.430)
pO2, Ven: 38 mmHg (ref 32.0–45.0)

## 2018-08-26 LAB — CBC WITH DIFFERENTIAL (CANCER CENTER ONLY)
Abs Immature Granulocytes: 0.1 10*3/uL — ABNORMAL HIGH (ref 0.00–0.07)
Basophils Absolute: 0 10*3/uL (ref 0.0–0.1)
Basophils Relative: 0 %
Eosinophils Absolute: 0.1 10*3/uL (ref 0.0–0.5)
Eosinophils Relative: 0 %
HCT: 35.7 % — ABNORMAL LOW (ref 39.0–52.0)
Hemoglobin: 12 g/dL — ABNORMAL LOW (ref 13.0–17.0)
Immature Granulocytes: 1 %
Lymphocytes Relative: 8 %
Lymphs Abs: 1.3 10*3/uL (ref 0.7–4.0)
MCH: 34.4 pg — ABNORMAL HIGH (ref 26.0–34.0)
MCHC: 33.6 g/dL (ref 30.0–36.0)
MCV: 102.3 fL — ABNORMAL HIGH (ref 80.0–100.0)
Monocytes Absolute: 1.1 10*3/uL — ABNORMAL HIGH (ref 0.1–1.0)
Monocytes Relative: 6 %
Neutro Abs: 14.3 10*3/uL — ABNORMAL HIGH (ref 1.7–7.7)
Neutrophils Relative %: 85 %
Platelet Count: 82 10*3/uL — ABNORMAL LOW (ref 150–400)
RBC: 3.49 MIL/uL — ABNORMAL LOW (ref 4.22–5.81)
RDW: 13.2 % (ref 11.5–15.5)
WBC Count: 16.8 10*3/uL — ABNORMAL HIGH (ref 4.0–10.5)
nRBC: 0 % (ref 0.0–0.2)

## 2018-08-26 LAB — COMPREHENSIVE METABOLIC PANEL
ALT: 108 U/L — ABNORMAL HIGH (ref 0–44)
AST: 24 U/L (ref 15–41)
Albumin: 3.7 g/dL (ref 3.5–5.0)
Alkaline Phosphatase: 82 U/L (ref 38–126)
Anion gap: 11 (ref 5–15)
BUN: 21 mg/dL — ABNORMAL HIGH (ref 6–20)
CO2: 24 mmol/L (ref 22–32)
Calcium: 9.3 mg/dL (ref 8.9–10.3)
Chloride: 102 mmol/L (ref 98–111)
Creatinine, Ser: 0.92 mg/dL (ref 0.61–1.24)
GFR calc Af Amer: >60 mL/min (ref >60)
GFR calc non Af Amer: >60 mL/min (ref >60)
Glucose, Bld: 250 mg/dL — ABNORMAL HIGH (ref 70–99)
Potassium: 4 mmol/L (ref 3.5–5.1)
Sodium: 137 mmol/L (ref 135–145)
Total Bilirubin: 2.7 mg/dL — ABNORMAL HIGH (ref 0.3–1.2)
Total Protein: 5.8 g/dL — ABNORMAL LOW (ref 6.5–8.1)

## 2018-08-26 LAB — GLUCOSE, CAPILLARY: Glucose-Capillary: 222 mg/dL — ABNORMAL HIGH (ref 70–99)

## 2018-08-26 LAB — LACTATE DEHYDROGENASE: LDH: 334 U/L — ABNORMAL HIGH (ref 98–192)

## 2018-08-26 MED ORDER — CALCIUM CARBONATE ANTACID 500 MG PO CHEW
2.0000 | CHEWABLE_TABLET | ORAL | Status: DC
  Administered 2018-08-26: 20:00:00 400 mg via ORAL

## 2018-08-26 MED ORDER — ACD FORMULA A 0.73-2.45-2.2 GM/100ML VI SOLN: 500.0000 mL | Status: DC

## 2018-08-26 MED ORDER — DIPHENHYDRAMINE HCL 25 MG PO CAPS
ORAL_CAPSULE | ORAL | Status: AC
  Administered 2018-08-26: 25 mg via ORAL
  Filled 2018-08-26: qty 1

## 2018-08-26 MED ORDER — ACD FORMULA A 0.73-2.45-2.2 GM/100ML VI SOLN
Status: AC
  Filled 2018-08-26: qty 500

## 2018-08-26 MED ORDER — FAMOTIDINE IN NACL 20-0.9 MG/50ML-% IV SOLN
20.0000 mg | Freq: Once | INTRAVENOUS | Status: DC
  Filled 2018-08-26: qty 50

## 2018-08-26 MED ORDER — DIPHENHYDRAMINE HCL 25 MG PO CAPS
25.0000 mg | ORAL_CAPSULE | Freq: Four times a day (QID) | ORAL | Status: DC | PRN
  Administered 2018-08-26 (×2): 25 mg via ORAL

## 2018-08-26 MED ORDER — CALCIUM CARBONATE ANTACID 500 MG PO CHEW
CHEWABLE_TABLET | ORAL | Status: AC
  Administered 2018-08-26: 400 mg via ORAL
  Filled 2018-08-26: qty 4

## 2018-08-26 MED ORDER — ANTICOAGULANT SODIUM CITRATE 4% (200MG/5ML) IV SOLN
5.0000 mL | Freq: Once | Status: DC
  Filled 2018-08-26: qty 5

## 2018-08-26 MED ORDER — CALCIUM CARBONATE ANTACID 500 MG PO CHEW: 2.0000 | CHEWABLE_TABLET | ORAL | Status: DC

## 2018-08-26 MED ORDER — SODIUM CHLORIDE 0.9 % IV SOLN
2.0000 g | Freq: Once | INTRAVENOUS | Status: DC
  Filled 2018-08-26: qty 20

## 2018-08-26 MED ORDER — ACETAMINOPHEN 325 MG PO TABS: 650.0000 mg | ORAL_TABLET | ORAL | Status: DC | PRN

## 2018-08-26 NOTE — Progress Notes (Signed)
tx completed pt was itching benadryl 50mg  PO given and itching resolved. Pt accompaned to the main entrance to his waiting wife; pt advised he has another treatment on 08/27/18 in the am.

## 2018-08-27 ENCOUNTER — Non-Acute Institutional Stay (HOSPITAL_COMMUNITY)
Admission: EM | Admit: 2018-08-27 | Discharge: 2018-08-27 | Disposition: A | Discharge status: Home / Self Care | Payer: Commercial Managed Care - PPO | Attending: Emergency Medicine | Admitting: Emergency Medicine

## 2018-08-27 ENCOUNTER — Other Ambulatory Visit: Payer: Self-pay

## 2018-08-27 ENCOUNTER — Encounter (HOSPITAL_COMMUNITY): Payer: Self-pay | Admitting: Emergency Medicine

## 2018-08-27 ENCOUNTER — Ambulatory Visit (HOSPITAL_COMMUNITY)
Admission: RE | Admit: 2018-08-27 | Discharge: 2018-08-27 | Disposition: A | Discharge status: Home / Self Care | Payer: Commercial Managed Care - PPO | Source: Home / Self Care

## 2018-08-27 ENCOUNTER — Other Ambulatory Visit: Payer: Self-pay | Admitting: Oncology

## 2018-08-27 DIAGNOSIS — K219 Gastro-esophageal reflux disease without esophagitis: Secondary | ICD-10-CM | POA: Diagnosis not present

## 2018-08-27 DIAGNOSIS — I10 Essential (primary) hypertension: Secondary | ICD-10-CM | POA: Diagnosis not present

## 2018-08-27 DIAGNOSIS — Z7952 Long term (current) use of systemic steroids: Secondary | ICD-10-CM | POA: Diagnosis not present

## 2018-08-27 DIAGNOSIS — E782 Mixed hyperlipidemia: Secondary | ICD-10-CM | POA: Insufficient documentation

## 2018-08-27 DIAGNOSIS — M311 Thrombotic microangiopathy: Secondary | ICD-10-CM | POA: Insufficient documentation

## 2018-08-27 DIAGNOSIS — E119 Type 2 diabetes mellitus without complications: Secondary | ICD-10-CM | POA: Insufficient documentation

## 2018-08-27 DIAGNOSIS — K5790 Diverticulosis of intestine, part unspecified, without perforation or abscess without bleeding: Secondary | ICD-10-CM | POA: Insufficient documentation

## 2018-08-27 DIAGNOSIS — Z7984 Long term (current) use of oral hypoglycemic drugs: Secondary | ICD-10-CM | POA: Diagnosis not present

## 2018-08-27 DIAGNOSIS — F17210 Nicotine dependence, cigarettes, uncomplicated: Secondary | ICD-10-CM | POA: Diagnosis not present

## 2018-08-27 DIAGNOSIS — Z8249 Family history of ischemic heart disease and other diseases of the circulatory system: Secondary | ICD-10-CM | POA: Insufficient documentation

## 2018-08-27 DIAGNOSIS — M3119 Other thrombotic microangiopathy: Secondary | ICD-10-CM

## 2018-08-27 DIAGNOSIS — Z992 Dependence on renal dialysis: Secondary | ICD-10-CM | POA: Insufficient documentation

## 2018-08-27 DIAGNOSIS — Z79899 Other long term (current) drug therapy: Secondary | ICD-10-CM | POA: Diagnosis not present

## 2018-08-27 DIAGNOSIS — D693 Immune thrombocytopenic purpura: Secondary | ICD-10-CM | POA: Diagnosis not present

## 2018-08-27 LAB — BASIC METABOLIC PANEL
Anion gap: 8 (ref 5–15)
BUN: 22 mg/dL — ABNORMAL HIGH (ref 6–20)
CO2: 27 mmol/L (ref 22–32)
Calcium: 8.7 mg/dL — ABNORMAL LOW (ref 8.9–10.3)
Chloride: 105 mmol/L (ref 98–111)
Creatinine, Ser: 1.04 mg/dL (ref 0.61–1.24)
GFR calc Af Amer: >60 mL/min (ref >60)
GFR calc non Af Amer: >60 mL/min (ref >60)
Glucose, Bld: 227 mg/dL — ABNORMAL HIGH (ref 70–99)
Potassium: 4.1 mmol/L (ref 3.5–5.1)
Sodium: 140 mmol/L (ref 135–145)

## 2018-08-27 LAB — THERAPEUTIC PLASMA EXCHANGE (BLOOD BANK)
Plasma Exchange: 3112
Plasma volume needed: 3112
Unit division: 0
Unit division: 0
Unit division: 0
Unit division: 0
Unit division: 0
Unit division: 0
Unit division: 0
Unit division: 0

## 2018-08-27 LAB — CBC WITH DIFFERENTIAL/PLATELET
Abs Immature Granulocytes: 0.06 10*3/uL (ref 0.00–0.07)
Basophils Absolute: 0 10*3/uL (ref 0.0–0.1)
Basophils Relative: 0 %
Eosinophils Absolute: 0.2 10*3/uL (ref 0.0–0.5)
Eosinophils Relative: 1 %
HCT: 35.3 % — ABNORMAL LOW (ref 39.0–52.0)
Hemoglobin: 11.7 g/dL — ABNORMAL LOW (ref 13.0–17.0)
Immature Granulocytes: 0 %
Lymphocytes Relative: 7 %
Lymphs Abs: 1.1 10*3/uL (ref 0.7–4.0)
MCH: 34.5 pg — ABNORMAL HIGH (ref 26.0–34.0)
MCHC: 33.1 g/dL (ref 30.0–36.0)
MCV: 104.1 fL — ABNORMAL HIGH (ref 80.0–100.0)
Monocytes Absolute: 0.7 10*3/uL (ref 0.1–1.0)
Monocytes Relative: 4 %
Neutro Abs: 13.5 10*3/uL — ABNORMAL HIGH (ref 1.7–7.7)
Neutrophils Relative %: 88 %
Platelets: 54 10*3/uL — ABNORMAL LOW (ref 150–400)
RBC: 3.39 MIL/uL — ABNORMAL LOW (ref 4.22–5.81)
RDW: 13.2 % (ref 11.5–15.5)
WBC: 15.6 10*3/uL — ABNORMAL HIGH (ref 4.0–10.5)
nRBC: 0 % (ref 0.0–0.2)

## 2018-08-27 LAB — POCT I-STAT EG7
Acid-Base Excess: 2 mmol/L (ref 0.0–2.0)
Bicarbonate: 28.3 mmol/L — ABNORMAL HIGH (ref 20.0–28.0)
Calcium, Ion: 1.27 mmol/L (ref 1.15–1.40)
HCT: 33 % — ABNORMAL LOW (ref 39.0–52.0)
Hemoglobin: 11.2 g/dL — ABNORMAL LOW (ref 13.0–17.0)
O2 Saturation: 61 %
Potassium: 4.2 mmol/L (ref 3.5–5.1)
Sodium: 139 mmol/L (ref 135–145)
TCO2: 30 mmol/L (ref 22–32)
pCO2, Ven: 48.1 mmHg (ref 44.0–60.0)
pH, Ven: 7.377 (ref 7.250–7.430)
pO2, Ven: 33 mmHg (ref 32.0–45.0)

## 2018-08-27 LAB — LACTATE DEHYDROGENASE: LDH: 196 U/L — ABNORMAL HIGH (ref 98–192)

## 2018-08-27 MED ORDER — ANTICOAGULANT SODIUM CITRATE 4% (200MG/5ML) IV SOLN
5.0000 mL | Freq: Once | Status: AC
  Administered 2018-08-27: 5 mL
  Filled 2018-08-27: qty 5

## 2018-08-27 MED ORDER — ACETAMINOPHEN 325 MG PO TABS: 650.0000 mg | ORAL_TABLET | ORAL | Status: DC | PRN

## 2018-08-27 MED ORDER — ACD FORMULA A 0.73-2.45-2.2 GM/100ML VI SOLN
Status: AC
  Administered 2018-08-27: 500 mL via INTRAVENOUS
  Filled 2018-08-27: qty 500

## 2018-08-27 MED ORDER — DIPHENHYDRAMINE HCL 25 MG PO CAPS
ORAL_CAPSULE | ORAL | Status: AC
  Administered 2018-08-27: 25 mg via ORAL
  Filled 2018-08-27: qty 1

## 2018-08-27 MED ORDER — CALCIUM CARBONATE ANTACID 500 MG PO CHEW
2.0000 | CHEWABLE_TABLET | ORAL | Status: AC
  Administered 2018-08-27 (×2): 400 mg via ORAL

## 2018-08-27 MED ORDER — ACD FORMULA A 0.73-2.45-2.2 GM/100ML VI SOLN
500.0000 mL | Status: DC
  Administered 2018-08-27 (×2): 500 mL via INTRAVENOUS

## 2018-08-27 MED ORDER — CALCIUM CARBONATE ANTACID 500 MG PO CHEW
CHEWABLE_TABLET | ORAL | Status: AC
  Administered 2018-08-27: 400 mg via ORAL
  Filled 2018-08-27: qty 4

## 2018-08-27 MED ORDER — SODIUM CHLORIDE 0.9 % IV SOLN
2.0000 g | Freq: Once | INTRAVENOUS | Status: AC
  Administered 2018-08-27: 2 g via INTRAVENOUS
  Filled 2018-08-27: qty 20

## 2018-08-27 MED ORDER — DIPHENHYDRAMINE HCL 25 MG PO CAPS
25.0000 mg | ORAL_CAPSULE | Freq: Four times a day (QID) | ORAL | Status: DC | PRN
  Administered 2018-08-27: 25 mg via ORAL

## 2018-08-27 NOTE — ED Provider Notes (Signed)
[place] EMERGENCY DEPARTMENT Provider Note   CSN: [id number] Arrival date & time: 08/27/18  5885    History   Chief Complaint Chief Complaint  Patient presents with  . Requires Dialysis    HPI Eric Hooper is a 43 y.o. male.     The history is provided by the patient.  Illness  Location:  Home Quality:  Ongoing TTP that requires treatment told to check in to the ED for clearance for dialysis Severity:  Moderate Onset quality:  Gradual Timing:  Constant Progression:  Improving Chronicity:  New Context:  TTP Relieved by:  Nothing Worsened by:  Nothing Associated symptoms: no abdominal pain, no chest pain, no congestion (none tried), no cough, no diarrhea, no ear pain, no fatigue, no fever, no headaches, no loss of consciousness, no myalgias, no nausea, no rash, no rhinorrhea, no shortness of breath, no sore throat, no vomiting and no wheezing   told to check into the ER prior to dialysis.  No labs are needed and the patient is denying all complaints at this time.    Past Medical History:  Diagnosis Date  . Controlled diabetes mellitus type 2 with complications ([place]) 06/03/7410  . Diabetes mellitus without complication ([facility])    non-insulin dependent  . Diverticulosis 02/01/2017   on CT scan abd/pelvis  . GERD (gastroesophageal reflux disease)   . Hypertension   . Kidney stones 02/03/2017  . Mixed hyperlipidemia 02/28/2018  . Smoker 04/13/2017    Patient Active Problem List   Diagnosis Date Noted  . Chest wall pain 08/14/2018  . At risk for dysfunction of heart 07/11/2018  . Mixed hyperlipidemia 02/28/2018  . Uncontrolled diabetes mellitus ([place]) 02/25/2018  . Phlebitis 05/12/2017  . Leg swelling 05/12/2017  . Tobacco use disorder 04/13/2017  . GERD (gastroesophageal reflux disease)   . Hypertension   . TTP (thrombotic thrombocytopenic purpura) ([place]) 02/06/2017  . Cough 02/05/2017  . Alcohol use 02/03/2017  . Renal insufficiency 02/03/2017   . Leukocytosis 02/03/2017  . Thrombocytopenia ([place]) 02/01/2017  . Hypokalemia 02/01/2017  . Macrocytic anemia 02/01/2017  . Hyperglycemia 02/01/2017  . Microscopic hematuria 02/01/2017  . Diverticulosis 02/01/2017    Past Surgical History:  Procedure Laterality Date  . ELBOW SURGERY Right   . IR FLUORO GUIDE CV LINE RIGHT  02/02/2017  . IR US GUIDE VASC ACCESS RIGHT  02/02/2017        Home Medications    Prior to Admission medications   Medication Sig Start Date End Date Taking? Authorizing Provider  acetaminophen (TYLENOL) 500 MG tablet Take 1 tablet (500 mg total) by mouth every 6 (six) hours as needed (pain). 08/19/18   Patriciaann Clan, DO  amLODipine (NORVASC) 10 MG tablet Take 1 tablet (10 mg total) by mouth daily. 07/12/18   Bonnita Hollow, MD  atorvastatin (LIPITOR) 40 MG tablet TAKE 1 TABLET BY MOUTH EVERY DAY Patient taking differently: Take 40 mg by mouth daily.  08/08/18   Bonnita Hollow, MD  blood glucose meter kit and supplies KIT Dispense based on patient and insurance preference. Use up to four times daily as directed. (FOR ICD-9 250.00, 250.01). 08/25/18   Bonnita Hollow, MD  esomeprazole (NEXIUM) 40 MG capsule Take 1 capsule (40 mg total) by mouth 2 (two) times daily before a meal. 07/11/18   Bonnita Hollow, MD  hydrOXYzine (ATARAX/VISTARIL) 25 MG tablet Take 1 tablet (25 mg total) by mouth every 6 (six) hours as needed for itching. Patient not  taking: Reported on 08/22/2018 08/19/18   Patriciaann Clan, DO  metFORMIN (GLUCOPHAGE) 1000 MG tablet Take 1 tablet (1,000 mg total) by mouth daily with breakfast. 07/21/17   Raenette Rover, Vickie L, NP-C  predniSONE (DELTASONE) 20 MG tablet Take 3 tablets (60 mg total) by mouth daily with breakfast. 08/19/18   Patriciaann Clan, DO    Family History Family History  Problem Relation Age of Onset  . Hypertension Mother   . Hyperlipidemia Mother   . Hypertension Father   . Colon cancer Maternal Aunt     Social History  Social History   Tobacco Use  . Smoking status: Current Every Day Smoker    Packs/day: 0.50    Years: 15.00    Pack years: 7.50    Types: Cigarettes  . Smokeless tobacco: Never Used  Substance Use Topics  . Alcohol use: Yes    Comment: 3-4 beers per day most days  . Drug use: No     Allergies   Ibuprofen   Review of Systems Review of Systems  Constitutional: Negative for fatigue and fever.  HENT: Negative for congestion (none tried), ear pain, rhinorrhea and sore throat.   Respiratory: Negative for cough, shortness of breath and wheezing.   Cardiovascular: Negative for chest pain.  Gastrointestinal: Negative for abdominal pain, diarrhea, nausea and vomiting.  Musculoskeletal: Negative for myalgias.  Skin: Negative for rash.  Neurological: Negative for loss of consciousness and headaches.  All other systems reviewed and are negative.    Physical Exam Updated Vital Signs BP (!) 152/114   Pulse 83   Temp 98.6 F (37 C) (Oral)   Resp 20   Ht '5\' 10"'  (1.778 m)   Wt 90.3 kg   SpO2 99%   BMI 28.55 kg/m   Physical Exam Vitals signs and nursing note reviewed.  Constitutional:      Appearance: He is normal weight. He is not ill-appearing.  HENT:     Head: Normocephalic and atraumatic.     Nose: Nose normal.  Eyes:     Conjunctiva/sclera: Conjunctivae normal.     Pupils: Pupils are equal, round, and reactive to light.  Neck:     Musculoskeletal: Normal range of motion and neck supple.  Cardiovascular:     Rate and Rhythm: Normal rate and regular rhythm.     Pulses: Normal pulses.     Heart sounds: Normal heart sounds.  Pulmonary:     Effort: Pulmonary effort is normal.     Breath sounds: Normal breath sounds.  Abdominal:     General: Abdomen is flat. Bowel sounds are normal.     Tenderness: There is no abdominal tenderness. There is no guarding.  Musculoskeletal: Normal range of motion.  Skin:    General: Skin is warm and dry.     Capillary Refill:  Capillary refill takes less than 2 seconds.  Neurological:     General: No focal deficit present.     Mental Status: He is alert and oriented to person, place, and time.  Psychiatric:        Mood and Affect: Mood normal.        Behavior: Behavior normal.      ED Treatments / Results  Labs (all labs ordered are listed, but only abnormal results are displayed) Labs Reviewed - No data to display  EKG None  Radiology No results found.     Final Clinical Impressions(s) / ED Diagnoses   Final diagnoses:  T.T.P. syndrome ([place])  To dialysis.     Cameryn Schum, MD 08/27/18 [phone]

## 2018-08-27 NOTE — ED Notes (Signed)
Transported to dialysis.

## 2018-08-27 NOTE — ED Notes (Signed)
Dialysis called 06:25; states that they are expecting him for a 06:30 appt. Md made aware.

## 2018-08-27 NOTE — ED Triage Notes (Signed)
Patient states that he was told to come to ED to check in for dialysis.

## 2018-08-27 NOTE — Progress Notes (Signed)
tx completed without any complications; pt denies itching, parethesia, n/v, dizziness or pain. Pt accompanied off the unit to the main entrance to go home.

## 2018-08-28 ENCOUNTER — Other Ambulatory Visit: Payer: Self-pay | Admitting: Oncology

## 2018-08-28 ENCOUNTER — Ambulatory Visit (HOSPITAL_COMMUNITY)
Admission: AD | Admit: 2018-08-28 | Discharge: 2018-08-28 | Disposition: A | Discharge status: Home / Self Care | Payer: Commercial Managed Care - PPO | Source: Other Acute Inpatient Hospital | Attending: Oncology | Admitting: Oncology

## 2018-08-28 DIAGNOSIS — M3119 Other thrombotic microangiopathy: Secondary | ICD-10-CM

## 2018-08-28 DIAGNOSIS — M311 Thrombotic microangiopathy: Secondary | ICD-10-CM | POA: Diagnosis not present

## 2018-08-28 LAB — THERAPEUTIC PLASMA EXCHANGE (BLOOD BANK)
Plasma Exchange: 3112
Plasma volume needed: 3112
Unit division: 0
Unit division: 0
Unit division: 0
Unit division: 0
Unit division: 0
Unit division: 0
Unit division: 0
Unit division: 0
Unit division: 0
Unit division: 0

## 2018-08-28 LAB — CBC WITH DIFFERENTIAL/PLATELET
Abs Immature Granulocytes: 0.07 10*3/uL (ref 0.00–0.07)
Basophils Absolute: 0 10*3/uL (ref 0.0–0.1)
Basophils Relative: 0 %
Eosinophils Absolute: 0.3 10*3/uL (ref 0.0–0.5)
Eosinophils Relative: 3 %
HCT: 34.5 % — ABNORMAL LOW (ref 39.0–52.0)
Hemoglobin: 11.5 g/dL — ABNORMAL LOW (ref 13.0–17.0)
Immature Granulocytes: 1 %
Lymphocytes Relative: 17 %
Lymphs Abs: 2.2 10*3/uL (ref 0.7–4.0)
MCH: 34.7 pg — ABNORMAL HIGH (ref 26.0–34.0)
MCHC: 33.3 g/dL (ref 30.0–36.0)
MCV: 104.2 fL — ABNORMAL HIGH (ref 80.0–100.0)
Monocytes Absolute: 0.9 10*3/uL (ref 0.1–1.0)
Monocytes Relative: 7 %
Neutro Abs: 9.3 10*3/uL — ABNORMAL HIGH (ref 1.7–7.7)
Neutrophils Relative %: 72 %
Platelets: 66 10*3/uL — ABNORMAL LOW (ref 150–400)
RBC: 3.31 MIL/uL — ABNORMAL LOW (ref 4.22–5.81)
RDW: 12.9 % (ref 11.5–15.5)
WBC: 12.8 10*3/uL — ABNORMAL HIGH (ref 4.0–10.5)
nRBC: 0 % (ref 0.0–0.2)

## 2018-08-28 LAB — COMPREHENSIVE METABOLIC PANEL
ALT: 56 U/L — ABNORMAL HIGH (ref 0–44)
AST: 27 U/L (ref 15–41)
Albumin: 3 g/dL — ABNORMAL LOW (ref 3.5–5.0)
Alkaline Phosphatase: 54 U/L (ref 38–126)
Anion gap: 7 (ref 5–15)
BUN: 16 mg/dL (ref 6–20)
CO2: 28 mmol/L (ref 22–32)
Calcium: 9.1 mg/dL (ref 8.9–10.3)
Chloride: 104 mmol/L (ref 98–111)
Creatinine, Ser: 0.92 mg/dL (ref 0.61–1.24)
GFR calc Af Amer: >60 mL/min (ref >60)
GFR calc non Af Amer: >60 mL/min (ref >60)
Glucose, Bld: 217 mg/dL — ABNORMAL HIGH (ref 70–99)
Potassium: 3.6 mmol/L (ref 3.5–5.1)
Sodium: 139 mmol/L (ref 135–145)
Total Bilirubin: 1.3 mg/dL — ABNORMAL HIGH (ref 0.3–1.2)
Total Protein: 5.4 g/dL — ABNORMAL LOW (ref 6.5–8.1)

## 2018-08-28 LAB — LACTATE DEHYDROGENASE: LDH: 134 U/L (ref 98–192)

## 2018-08-28 LAB — POCT I-STAT EG7
Acid-Base Excess: 4 mmol/L — ABNORMAL HIGH (ref 0.0–2.0)
Bicarbonate: 29.4 mmol/L — ABNORMAL HIGH (ref 20.0–28.0)
Calcium, Ion: 1.31 mmol/L (ref 1.15–1.40)
HCT: 33 % — ABNORMAL LOW (ref 39.0–52.0)
Hemoglobin: 11.2 g/dL — ABNORMAL LOW (ref 13.0–17.0)
O2 Saturation: 60 %
Potassium: 3.7 mmol/L (ref 3.5–5.1)
Sodium: 139 mmol/L (ref 135–145)
TCO2: 31 mmol/L (ref 22–32)
pCO2, Ven: 49.2 mmHg (ref 44.0–60.0)
pH, Ven: 7.384 (ref 7.250–7.430)
pO2, Ven: 32 mmHg (ref 32.0–45.0)

## 2018-08-28 MED ORDER — DIPHENHYDRAMINE HCL 25 MG PO CAPS
ORAL_CAPSULE | ORAL | Status: AC
  Administered 2018-08-28: 25 mg via ORAL
  Filled 2018-08-28: qty 1

## 2018-08-28 MED ORDER — ACETAMINOPHEN 325 MG PO TABS: 650.0000 mg | ORAL_TABLET | ORAL | Status: DC | PRN

## 2018-08-28 MED ORDER — SODIUM CHLORIDE 0.9 % IV SOLN
2.0000 g | Freq: Once | INTRAVENOUS | Status: AC
  Administered 2018-08-28: 2 g via INTRAVENOUS
  Filled 2018-08-28 (×2): qty 20

## 2018-08-28 MED ORDER — ACD FORMULA A 0.73-2.45-2.2 GM/100ML VI SOLN
Status: AC
  Administered 2018-08-28: 500 mL via INTRAVENOUS
  Filled 2018-08-28: qty 500

## 2018-08-28 MED ORDER — ACD FORMULA A 0.73-2.45-2.2 GM/100ML VI SOLN
500.0000 mL | Status: DC
  Administered 2018-08-28 (×2): 500 mL via INTRAVENOUS

## 2018-08-28 MED ORDER — DIPHENHYDRAMINE HCL 25 MG PO CAPS
25.0000 mg | ORAL_CAPSULE | Freq: Four times a day (QID) | ORAL | Status: DC | PRN
  Administered 2018-08-28: 25 mg via ORAL

## 2018-08-28 MED ORDER — FAMOTIDINE IN NACL 20-0.9 MG/50ML-% IV SOLN
20.0000 mg | INTRAVENOUS | Status: AC
  Administered 2018-08-28: 20 mg via INTRAVENOUS
  Filled 2018-08-28: qty 50

## 2018-08-28 MED ORDER — ANTICOAGULANT SODIUM CITRATE 4% (200MG/5ML) IV SOLN
5.0000 mL | Freq: Once | Status: AC
  Administered 2018-08-28: 5 mL
  Filled 2018-08-28 (×2): qty 5

## 2018-08-28 MED ORDER — CALCIUM CARBONATE ANTACID 500 MG PO CHEW
2.0000 | CHEWABLE_TABLET | ORAL | Status: AC
  Administered 2018-08-28 (×2): 400 mg via ORAL

## 2018-08-28 MED ORDER — CALCIUM CARBONATE ANTACID 500 MG PO CHEW
CHEWABLE_TABLET | ORAL | Status: AC
  Administered 2018-08-28: 400 mg via ORAL
  Filled 2018-08-28: qty 4

## 2018-08-28 NOTE — Progress Notes (Signed)
Pt discharged home; tolerated TPE tx welll; denies pain, paresthesia, n/v; weakness or dizziness. Pt made aware that he has another session of TPE on 08/29/2018.

## 2018-08-29 ENCOUNTER — Inpatient Hospital Stay: Payer: Commercial Managed Care - PPO

## 2018-08-29 ENCOUNTER — Non-Acute Institutional Stay (HOSPITAL_COMMUNITY)
Admission: RE | Admit: 2018-08-29 | Discharge: 2018-08-29 | Disposition: A | Discharge status: Home / Self Care | Payer: Commercial Managed Care - PPO | Source: Ambulatory Visit | Attending: Oncology | Admitting: Oncology

## 2018-08-29 ENCOUNTER — Telehealth: Payer: Self-pay | Admitting: *Deleted

## 2018-08-29 DIAGNOSIS — M311 Thrombotic microangiopathy: Secondary | ICD-10-CM | POA: Diagnosis not present

## 2018-08-29 LAB — CBC WITH DIFFERENTIAL/PLATELET
Abs Immature Granulocytes: 0.07 10*3/uL (ref 0.00–0.07)
Basophils Absolute: 0 10*3/uL (ref 0.0–0.1)
Basophils Relative: 0 %
Eosinophils Absolute: 0.1 10*3/uL (ref 0.0–0.5)
Eosinophils Relative: 1 %
HCT: 36.4 % — ABNORMAL LOW (ref 39.0–52.0)
Hemoglobin: 12.2 g/dL — ABNORMAL LOW (ref 13.0–17.0)
Immature Granulocytes: 1 %
Lymphocytes Relative: 8 %
Lymphs Abs: 1 10*3/uL (ref 0.7–4.0)
MCH: 34.7 pg — ABNORMAL HIGH (ref 26.0–34.0)
MCHC: 33.5 g/dL (ref 30.0–36.0)
MCV: 103.4 fL — ABNORMAL HIGH (ref 80.0–100.0)
Monocytes Absolute: 0.3 10*3/uL (ref 0.1–1.0)
Monocytes Relative: 2 %
Neutro Abs: 10.6 10*3/uL — ABNORMAL HIGH (ref 1.7–7.7)
Neutrophils Relative %: 88 %
Platelets: 95 10*3/uL — ABNORMAL LOW (ref 150–400)
RBC: 3.52 MIL/uL — ABNORMAL LOW (ref 4.22–5.81)
RDW: 12.7 % (ref 11.5–15.5)
WBC: 12 10*3/uL — ABNORMAL HIGH (ref 4.0–10.5)
nRBC: 0 % (ref 0.0–0.2)

## 2018-08-29 LAB — THERAPEUTIC PLASMA EXCHANGE (BLOOD BANK)
Plasma Exchange: 3112
Plasma volume needed: 3112
Unit division: 0
Unit division: 0
Unit division: 0
Unit division: 0
Unit division: 0
Unit division: 0
Unit division: 0
Unit division: 0
Unit division: 0

## 2018-08-29 LAB — BASIC METABOLIC PANEL
Anion gap: 10 (ref 5–15)
BUN: 16 mg/dL (ref 6–20)
CO2: 27 mmol/L (ref 22–32)
Calcium: 9.1 mg/dL (ref 8.9–10.3)
Chloride: 102 mmol/L (ref 98–111)
Creatinine, Ser: 1.02 mg/dL (ref 0.61–1.24)
GFR calc Af Amer: >60 mL/min (ref >60)
GFR calc non Af Amer: >60 mL/min (ref >60)
Glucose, Bld: 259 mg/dL — ABNORMAL HIGH (ref 70–99)
Potassium: 3.8 mmol/L (ref 3.5–5.1)
Sodium: 139 mmol/L (ref 135–145)

## 2018-08-29 MED ORDER — DIPHENHYDRAMINE HCL 25 MG PO CAPS
ORAL_CAPSULE | ORAL | Status: AC
  Filled 2018-08-29: qty 1

## 2018-08-29 MED ORDER — ACD FORMULA A 0.73-2.45-2.2 GM/100ML VI SOLN
Status: AC
  Filled 2018-08-29: qty 500

## 2018-08-29 MED ORDER — ACETAMINOPHEN 325 MG PO TABS: 650.0000 mg | ORAL_TABLET | ORAL | Status: DC | PRN

## 2018-08-29 MED ORDER — FAMOTIDINE IN NACL 20-0.9 MG/50ML-% IV SOLN
20.0000 mg | Freq: Once | INTRAVENOUS | Status: AC
  Administered 2018-08-29: 20 mg via INTRAVENOUS
  Filled 2018-08-29: qty 50

## 2018-08-29 MED ORDER — ANTICOAGULANT SODIUM CITRATE 4% (200MG/5ML) IV SOLN
5.0000 mL | Freq: Once | Status: DC
  Filled 2018-08-29: qty 5

## 2018-08-29 MED ORDER — ACD FORMULA A 0.73-2.45-2.2 GM/100ML VI SOLN
500.0000 mL | Status: DC
  Administered 2018-08-29: 500 mL via INTRAVENOUS

## 2018-08-29 MED ORDER — CALCIUM CARBONATE ANTACID 500 MG PO CHEW
CHEWABLE_TABLET | ORAL | Status: AC
  Administered 2018-08-29: 400 mg
  Filled 2018-08-29: qty 2

## 2018-08-29 MED ORDER — CALCIUM CARBONATE ANTACID 500 MG PO CHEW: 2.0000 | CHEWABLE_TABLET | ORAL | Status: DC

## 2018-08-29 MED ORDER — ACD FORMULA A 0.73-2.45-2.2 GM/100ML VI SOLN: 500.0000 mL | Status: DC

## 2018-08-29 MED ORDER — SODIUM CHLORIDE 0.9 % IV SOLN: 2.0000 g | Freq: Once | INTRAVENOUS | Status: DC

## 2018-08-29 MED ORDER — SODIUM CHLORIDE 0.9 % IV SOLN
2.0000 g | Freq: Once | INTRAVENOUS | Status: DC
  Filled 2018-08-29: qty 20

## 2018-08-29 MED ORDER — ANTICOAGULANT SODIUM CITRATE 4% (200MG/5ML) IV SOLN: 5.0000 mL | Freq: Once | Status: DC

## 2018-08-29 MED ORDER — DIPHENHYDRAMINE HCL 25 MG PO CAPS
25.0000 mg | ORAL_CAPSULE | Freq: Four times a day (QID) | ORAL | Status: DC | PRN
  Administered 2018-08-29: 25 mg via ORAL

## 2018-08-29 MED ORDER — DIPHENHYDRAMINE HCL 25 MG PO CAPS: 25.0000 mg | ORAL_CAPSULE | Freq: Four times a day (QID) | ORAL | Status: DC | PRN

## 2018-08-29 NOTE — Telephone Encounter (Signed)
Patient asking to confirm that his pheresis is only MWF this week? Also needs to drop off FMLA form for completion and his significant other, Drucilla Chalet needs a letter from MD that she is needed to drive patient to and from his MD and pheresis appointments. This is needed for her work.

## 2018-08-30 ENCOUNTER — Telehealth: Payer: Self-pay | Admitting: *Deleted

## 2018-08-30 ENCOUNTER — Telehealth: Payer: Self-pay | Admitting: Oncology

## 2018-08-30 LAB — THERAPEUTIC PLASMA EXCHANGE (BLOOD BANK)
Plasma Exchange: 3500
Plasma volume needed: 3500
Unit division: 0
Unit division: 0
Unit division: 0
Unit division: 0
Unit division: 0
Unit division: 0
Unit division: 0
Unit division: 0
Unit division: 0
Unit division: 0

## 2018-08-30 NOTE — Telephone Encounter (Signed)
R/s appt per 5/4 sch message - pt is aware of new appt date and time for 5/7 appt.

## 2018-08-30 NOTE — Telephone Encounter (Signed)
Called [facility]by for chemotherapy F/U.  Patient is doing well.  Expressed Rituxan treatment questions.  Routing to provider.   1. "Coming often for treatment and labs.  When will the Rituxan take effect?   2. How long does it last?   3. What specifically is going on in my body? Dr. Benay Spice can answer when I come in Thursday."        Denies n/v.  Denies any new side effects or symptoms.  Bowel and bladder functioning well.  Eating and drinking well.  Instructed to drink 64 oz minimum daily or at least the day before, of and after treatment.   Denies questions or needs at this time.  Encouraged to call [phone] Mon -Fri 8:00 am - 4:30 pm or anytime as needed for symptoms, changes or event outside office hours. Offer declined to assist with MyChart activation or provide a new code with this call.   "I've been busy and out with lots of appointments.  After visit summary is in car with my girlfriend.  I'll activate." Conveyed the 08-25-2018 [employer] code expires 10-09-2018.

## 2018-08-30 NOTE — Telephone Encounter (Signed)
-----   Message from Thomasene Lot, RN sent at 08/25/2018  3:23 PM EDT ----- Regarding: Dr Benay Spice 1st tx f/u call 1st tx f/u call

## 2018-08-31 ENCOUNTER — Non-Acute Institutional Stay (HOSPITAL_COMMUNITY)
Admission: AD | Admit: 2018-08-31 | Discharge: 2018-08-31 | Disposition: A | Discharge status: Home / Self Care | Payer: Commercial Managed Care - PPO | Source: Ambulatory Visit | Attending: Oncology | Admitting: Oncology

## 2018-08-31 DIAGNOSIS — M311 Thrombotic microangiopathy: Secondary | ICD-10-CM | POA: Diagnosis not present

## 2018-08-31 LAB — BASIC METABOLIC PANEL
Anion gap: 8 (ref 5–15)
BUN: 18 mg/dL (ref 6–20)
CO2: 25 mmol/L (ref 22–32)
Calcium: 8.6 mg/dL — ABNORMAL LOW (ref 8.9–10.3)
Chloride: 103 mmol/L (ref 98–111)
Creatinine, Ser: 0.89 mg/dL (ref 0.61–1.24)
GFR calc Af Amer: >60 mL/min (ref >60)
GFR calc non Af Amer: >60 mL/min (ref >60)
Glucose, Bld: 320 mg/dL — ABNORMAL HIGH (ref 70–99)
Potassium: 3.9 mmol/L (ref 3.5–5.1)
Sodium: 136 mmol/L (ref 135–145)

## 2018-08-31 LAB — CBC
HCT: 34.7 % — ABNORMAL LOW (ref 39.0–52.0)
Hemoglobin: 11.6 g/dL — ABNORMAL LOW (ref 13.0–17.0)
MCH: 34.3 pg — ABNORMAL HIGH (ref 26.0–34.0)
MCHC: 33.4 g/dL (ref 30.0–36.0)
MCV: 102.7 fL — ABNORMAL HIGH (ref 80.0–100.0)
Platelets: 145 10*3/uL — ABNORMAL LOW (ref 150–400)
RBC: 3.38 MIL/uL — ABNORMAL LOW (ref 4.22–5.81)
RDW: 12.6 % (ref 11.5–15.5)
WBC: 12 10*3/uL — ABNORMAL HIGH (ref 4.0–10.5)
nRBC: 0 % (ref 0.0–0.2)

## 2018-08-31 LAB — POCT I-STAT EG7
Acid-Base Excess: 2 mmol/L (ref 0.0–2.0)
Bicarbonate: 27.1 mmol/L (ref 20.0–28.0)
Calcium, Ion: 1.27 mmol/L (ref 1.15–1.40)
HCT: 33 % — ABNORMAL LOW (ref 39.0–52.0)
Hemoglobin: 11.2 g/dL — ABNORMAL LOW (ref 13.0–17.0)
O2 Saturation: 64 %
Potassium: 4.1 mmol/L (ref 3.5–5.1)
Sodium: 137 mmol/L (ref 135–145)
TCO2: 29 mmol/L (ref 22–32)
pCO2, Ven: 46.1 mmHg (ref 44.0–60.0)
pH, Ven: 7.378 (ref 7.250–7.430)
pO2, Ven: 34 mmHg (ref 32.0–45.0)

## 2018-08-31 MED ORDER — ANTICOAGULANT SODIUM CITRATE 4% (200MG/5ML) IV SOLN
5.0000 mL | Freq: Once | Status: AC
  Administered 2018-08-31: 5 mL
  Filled 2018-08-31: qty 5

## 2018-08-31 MED ORDER — DIPHENHYDRAMINE HCL 25 MG PO CAPS
25.0000 mg | ORAL_CAPSULE | Freq: Four times a day (QID) | ORAL | Status: DC | PRN
  Administered 2018-08-31: 08:00:00 25 mg via ORAL

## 2018-08-31 MED ORDER — CALCIUM CARBONATE ANTACID 500 MG PO CHEW
CHEWABLE_TABLET | ORAL | Status: AC
  Administered 2018-08-31: 400 mg via ORAL
  Filled 2018-08-31: qty 4

## 2018-08-31 MED ORDER — ACD FORMULA A 0.73-2.45-2.2 GM/100ML VI SOLN
Status: AC
  Administered 2018-08-31: 500 mL via INTRAVENOUS
  Filled 2018-08-31: qty 500

## 2018-08-31 MED ORDER — FAMOTIDINE IN NACL 20-0.9 MG/50ML-% IV SOLN
20.0000 mg | Freq: Once | INTRAVENOUS | Status: AC
  Administered 2018-08-31: 20 mg via INTRAVENOUS
  Filled 2018-08-31: qty 50

## 2018-08-31 MED ORDER — DIPHENHYDRAMINE HCL 25 MG PO CAPS
ORAL_CAPSULE | ORAL | Status: AC
  Administered 2018-08-31: 25 mg via ORAL
  Filled 2018-08-31: qty 1

## 2018-08-31 MED ORDER — SODIUM CHLORIDE 0.9 % IV SOLN
2.0000 g | Freq: Once | INTRAVENOUS | Status: AC
  Administered 2018-08-31: 2 g via INTRAVENOUS
  Filled 2018-08-31: qty 20

## 2018-08-31 MED ORDER — CALCIUM CARBONATE ANTACID 500 MG PO CHEW
2.0000 | CHEWABLE_TABLET | ORAL | Status: AC
  Administered 2018-08-31 (×2): 400 mg via ORAL

## 2018-08-31 MED ORDER — ACETAMINOPHEN 325 MG PO TABS: 650.0000 mg | ORAL_TABLET | ORAL | Status: DC | PRN

## 2018-08-31 MED ORDER — ACD FORMULA A 0.73-2.45-2.2 GM/100ML VI SOLN
500.0000 mL | Status: DC
  Administered 2018-08-31 (×2): 500 mL via INTRAVENOUS

## 2018-08-31 NOTE — Progress Notes (Signed)
tx completed; pt accompanied off the unit to the main entrance; pt denies pain, n/v, dizziness, weakness of parethesia.

## 2018-09-01 ENCOUNTER — Ambulatory Visit: Payer: Commercial Managed Care - PPO

## 2018-09-01 ENCOUNTER — Other Ambulatory Visit: Payer: Self-pay

## 2018-09-01 ENCOUNTER — Ambulatory Visit: Payer: Commercial Managed Care - PPO | Admitting: Nurse Practitioner

## 2018-09-01 ENCOUNTER — Other Ambulatory Visit: Payer: Commercial Managed Care - PPO

## 2018-09-01 ENCOUNTER — Telehealth: Payer: Self-pay | Admitting: Oncology

## 2018-09-01 ENCOUNTER — Inpatient Hospital Stay: Payer: Commercial Managed Care - PPO

## 2018-09-01 ENCOUNTER — Other Ambulatory Visit: Payer: Self-pay | Admitting: Family Medicine

## 2018-09-01 ENCOUNTER — Inpatient Hospital Stay (HOSPITAL_BASED_OUTPATIENT_CLINIC_OR_DEPARTMENT_OTHER): Payer: Commercial Managed Care - PPO | Admitting: Oncology

## 2018-09-01 VITALS — BP 133/81 | HR 59 | Temp 98.0°F | Resp 18

## 2018-09-01 DIAGNOSIS — M3119 Other thrombotic microangiopathy: Secondary | ICD-10-CM

## 2018-09-01 DIAGNOSIS — I10 Essential (primary) hypertension: Secondary | ICD-10-CM

## 2018-09-01 DIAGNOSIS — E782 Mixed hyperlipidemia: Secondary | ICD-10-CM

## 2018-09-01 DIAGNOSIS — E1165 Type 2 diabetes mellitus with hyperglycemia: Secondary | ICD-10-CM

## 2018-09-01 DIAGNOSIS — M311 Thrombotic microangiopathy: Secondary | ICD-10-CM

## 2018-09-01 DIAGNOSIS — H538 Other visual disturbances: Secondary | ICD-10-CM | POA: Diagnosis not present

## 2018-09-01 DIAGNOSIS — Z5112 Encounter for antineoplastic immunotherapy: Secondary | ICD-10-CM | POA: Diagnosis not present

## 2018-09-01 DIAGNOSIS — Z4901 Encounter for fitting and adjustment of extracorporeal dialysis catheter: Secondary | ICD-10-CM

## 2018-09-01 LAB — THERAPEUTIC PLASMA EXCHANGE (BLOOD BANK)
Plasma volume needed: 3400
Unit division: 0
Unit division: 0
Unit division: 0
Unit division: 0
Unit division: 0
Unit division: 0
Unit division: 0
Unit division: 0
Unit division: 0
Unit division: 0

## 2018-09-01 LAB — CBC WITH DIFFERENTIAL (CANCER CENTER ONLY)
Abs Immature Granulocytes: 0.05 10*3/uL (ref 0.00–0.07)
Basophils Absolute: 0 10*3/uL (ref 0.0–0.1)
Basophils Relative: 0 %
Eosinophils Absolute: 0.1 10*3/uL (ref 0.0–0.5)
Eosinophils Relative: 1 %
HCT: 35.5 % — ABNORMAL LOW (ref 39.0–52.0)
Hemoglobin: 12 g/dL — ABNORMAL LOW (ref 13.0–17.0)
Immature Granulocytes: 0 %
Lymphocytes Relative: 8 %
Lymphs Abs: 1 10*3/uL (ref 0.7–4.0)
MCH: 34.3 pg — ABNORMAL HIGH (ref 26.0–34.0)
MCHC: 33.8 g/dL (ref 30.0–36.0)
MCV: 101.4 fL — ABNORMAL HIGH (ref 80.0–100.0)
Monocytes Absolute: 0.4 10*3/uL (ref 0.1–1.0)
Monocytes Relative: 3 %
Neutro Abs: 10.6 10*3/uL — ABNORMAL HIGH (ref 1.7–7.7)
Neutrophils Relative %: 88 %
Platelet Count: 187 10*3/uL (ref 150–400)
RBC: 3.5 MIL/uL — ABNORMAL LOW (ref 4.22–5.81)
RDW: 12.5 % (ref 11.5–15.5)
WBC Count: 12.1 10*3/uL — ABNORMAL HIGH (ref 4.0–10.5)
nRBC: 0 % (ref 0.0–0.2)

## 2018-09-01 LAB — HEPATIC FUNCTION PANEL
ALT: 54 U/L — ABNORMAL HIGH (ref 0–44)
AST: 21 U/L (ref 15–41)
Albumin: 3.1 g/dL — ABNORMAL LOW (ref 3.5–5.0)
Alkaline Phosphatase: 58 U/L (ref 38–126)
Bilirubin, Direct: <0.1 mg/dL (ref 0.0–0.2)
Total Bilirubin: 0.9 mg/dL (ref 0.3–1.2)
Total Protein: 5.5 g/dL — ABNORMAL LOW (ref 6.5–8.1)

## 2018-09-01 LAB — CMP (CANCER CENTER ONLY)
ALT: 51 U/L — ABNORMAL HIGH (ref 0–44)
AST: 20 U/L (ref 15–41)
Albumin: 3.3 g/dL — ABNORMAL LOW (ref 3.5–5.0)
Alkaline Phosphatase: 64 U/L (ref 38–126)
Anion gap: 10 (ref 5–15)
BUN: 13 mg/dL (ref 6–20)
CO2: 26 mmol/L (ref 22–32)
Calcium: 8.8 mg/dL — ABNORMAL LOW (ref 8.9–10.3)
Chloride: 103 mmol/L (ref 98–111)
Creatinine: 0.95 mg/dL (ref 0.61–1.24)
GFR, Est AFR Am: >60 mL/min (ref >60)
GFR, Estimated: >60 mL/min (ref >60)
Glucose, Bld: 396 mg/dL — ABNORMAL HIGH (ref 70–99)
Potassium: 4 mmol/L (ref 3.5–5.1)
Sodium: 139 mmol/L (ref 135–145)
Total Bilirubin: 0.6 mg/dL (ref 0.3–1.2)
Total Protein: 5.6 g/dL — ABNORMAL LOW (ref 6.5–8.1)

## 2018-09-01 LAB — LACTATE DEHYDROGENASE
LDH: 125 U/L (ref 98–192)
LDH: 171 U/L (ref 98–192)

## 2018-09-01 MED ORDER — SODIUM CHLORIDE 0.9 % IV SOLN
375.0000 mg/m2 | Freq: Once | INTRAVENOUS | Status: AC
  Administered 2018-09-01: 10:00:00 800 mg via INTRAVENOUS
  Filled 2018-09-01: qty 50

## 2018-09-01 MED ORDER — ACETAMINOPHEN 325 MG PO TABS
ORAL_TABLET | ORAL | Status: AC
  Filled 2018-09-01: qty 2

## 2018-09-01 MED ORDER — ACETAMINOPHEN 325 MG PO TABS
650.0000 mg | ORAL_TABLET | Freq: Once | ORAL | Status: AC
  Administered 2018-09-01: 09:00:00 650 mg via ORAL

## 2018-09-01 MED ORDER — ANTICOAGULANT SODIUM CITRATE 4% (200MG/5ML) IV SOLN
5.0000 mL | Freq: Once | Status: AC
  Administered 2018-09-01: 14:00:00 1.2 mL via INTRAVENOUS
  Filled 2018-09-01: qty 5

## 2018-09-01 MED ORDER — DIPHENHYDRAMINE HCL 25 MG PO CAPS
50.0000 mg | ORAL_CAPSULE | Freq: Once | ORAL | Status: AC
  Administered 2018-09-01: 09:00:00 50 mg via ORAL

## 2018-09-01 MED ORDER — DIPHENHYDRAMINE HCL 25 MG PO CAPS
ORAL_CAPSULE | ORAL | Status: AC
  Filled 2018-09-01: qty 2

## 2018-09-01 MED ORDER — FAMOTIDINE IN NACL 20-0.9 MG/50ML-% IV SOLN
20.0000 mg | Freq: Once | INTRAVENOUS | Status: AC
  Administered 2018-09-01: 09:00:00 20 mg via INTRAVENOUS

## 2018-09-01 MED ORDER — SODIUM CHLORIDE 0.9 % IV SOLN
Freq: Once | INTRAVENOUS | Status: AC
  Administered 2018-09-01: 09:00:00 via INTRAVENOUS
  Filled 2018-09-01: qty 250

## 2018-09-01 MED ORDER — SODIUM CHLORIDE 0.9% FLUSH
10.0000 mL | INTRAVENOUS | Status: DC | PRN
  Administered 2018-09-01: 14:00:00 10 mL
  Filled 2018-09-01: qty 10

## 2018-09-01 MED ORDER — FAMOTIDINE IN NACL 20-0.9 MG/50ML-% IV SOLN
INTRAVENOUS | Status: AC
  Filled 2018-09-01: qty 50

## 2018-09-01 MED ORDER — HEPARIN SOD (PORK) LOCK FLUSH 100 UNIT/ML IV SOLN
250.0000 [IU] | Freq: Once | INTRAVENOUS | Status: AC | PRN
  Administered 2018-09-01: 250 [IU]
  Filled 2018-09-01: qty 5

## 2018-09-01 NOTE — Telephone Encounter (Signed)
Scheduled appt per 5/7 los. °

## 2018-09-01 NOTE — Progress Notes (Signed)
Eric Hooper OFFICE PROGRESS NOTE   Diagnosis: TTP  INTERVAL HISTORY:   [name] returns for scheduled visit.  He feels well.  He completed a first treatment with rituximab on 08/25/2018.  He developed difficulty swallowing during the rituximab infusion.  He was given Solu-Medrol and additional Benadryl and was able to complete the infusion without further symptoms. He resumed plasmapheresis on 08/26/2018 and completed 4 daily procedures.  He also received plasmapheresis yesterday.  He reports tolerating the plasmapheresis without symptoms of an allergic reaction or hypocalcemia.  [name] is checking his blood sugar at home.  He reports the highest reading at home has been about 200. No bleeding or fever.  He reports blurring of the vision in the mornings that improves by late day.  No headache or focal visual symptoms.  Objective:  Vital signs in last 24 hours:  There were no vitals taken for this visit.    HEENT: No thrush  Vascular: No leg edema  Skin: No ecchymoses or rash  Portacath/PICC-without erythema  Lab Results:  Lab Results  Component Value Date   WBC 12.1 (H) 09/01/2018   HGB 12.0 (L) 09/01/2018   HCT 35.5 (L) 09/01/2018   MCV 101.4 (H) 09/01/2018   PLT 187 09/01/2018   NEUTROABS 10.6 (H) 09/01/2018    CMP  Lab Results  Component Value Date   NA 139 09/01/2018   K 4.0 09/01/2018   CL 103 09/01/2018   CO2 26 09/01/2018   GLUCOSE 396 (H) 09/01/2018   BUN 13 09/01/2018   CREATININE 0.95 09/01/2018   CALCIUM 8.8 (L) 09/01/2018   PROT 5.6 (L) 09/01/2018   ALBUMIN 3.3 (L) 09/01/2018   AST 20 09/01/2018   ALT 51 (H) 09/01/2018   ALKPHOS 64 09/01/2018   BILITOT 0.6 09/01/2018   GFRNONAA >60 09/01/2018   GFRAA >60 09/01/2018   LDH 171  Medications: I have reviewed the patient's current medications.   Assessment/Plan: 1.  TTP initially diagnosed in October 2018 with recurrence in April 2020.  Initiation of daily plasma exchange  and Solu-Medrol 02/02/2017  [name] Antibody- 72, activity less than 2% on initial presentation; [name] from 08/15/2018 <2.0.  Last plasma exchange 02/06/2017  Prednisone 60 mg daily beginning 02/08/2017  Prednisone 40 mg daily beginning 02/17/2017  Prednisone 30 mg daily beginning 03/09/2017  Prednisone 20 mg daily beginning 03/23/2017  Prednisone 10 mg daily beginning 04/01/2017  Prednisone 5 mg daily for 5 days then 5 mg every other day for 5 doses then stop beginning 05/11/2017  Recurrent TTP 08/14/2018  Plasma exchange/steroids started on 08/15/2018.  Plasma exchange discontinued after treatment on 08/19/2019, discharged on prednisone 60mg  daily  Prednisone taper to 40 mg daily 08/22/2018  Plasma exchange resumed 08/26/2018, 08/27/2018, 08/28/2018, 08/29/2018, 08/31/2018, 09/02/2018  Prednisone taper to 30 mg daily 09/01/2018 2. Xiphoid pain-etiology unclear,resolved 3. Hypertension 4. Diabetes 5. Alcohol/Tobacco use 6.History of renal insufficiency 7. Right IJ dialysis catheter placed 02/02/2017, removed  New right IJ dialysis catheter 08/15/2018    Disposition: [name] appears to be responding to the repeat treatment with plasmapheresis.  He will complete week #2 rituximab today.  We taper the prednisone to 30 mg daily. I discussed the treatment plan with him.  The plan is to complete 4 weeks of rituximab and a slow prednisone taper.  He will undergo plasma pheresis again tomorrow.  He will be off of plasmapheresis for the weekend and return for an office visit on 09/05/2018.  The blood sugar is  elevated today.  I cautioned him to avoid concentrated sweets and continue monitoring his blood sugar.  Hopefully the blood sugar will improve as we lower the prednisone dose. The blurred vision is likely related to prednisone and hyperglycemia.  I spoke to [name] by phone on 08/27/2018 and 08/28/2018.  I coordinated plasmapheresis with the dialysis unit nurses on 08/27/2018 and  08/28/2018.  I placed plasmapheresis orders on both of these days.    [name], MD  09/01/2018  11:47 AM

## 2018-09-01 NOTE — Patient Instructions (Signed)
[facility] Discharge Instructions for Patients Receiving Chemotherapy  Today you received the following chemotherapy agents Rituxan  To help prevent nausea and vomiting after your treatment, we encourage you to take your nausea medication as prescribed.   If you develop nausea and vomiting that is not controlled by your nausea medication, call the clinic.   BELOW ARE SYMPTOMS THAT SHOULD BE REPORTED IMMEDIATELY:  *FEVER GREATER THAN 100.5 F  *CHILLS WITH OR WITHOUT FEVER  NAUSEA AND VOMITING THAT IS NOT CONTROLLED WITH YOUR NAUSEA MEDICATION  *UNUSUAL SHORTNESS OF BREATH  *UNUSUAL BRUISING OR BLEEDING  TENDERNESS IN MOUTH AND THROAT WITH OR WITHOUT PRESENCE OF ULCERS  *URINARY PROBLEMS  *BOWEL PROBLEMS  UNUSUAL RASH Items with * indicate a potential emergency and should be followed up as soon as possible.  Feel free to call the clinic should you have any questions or concerns. The clinic phone number is [phone].  Please show the [facility] at check-in to the Emergency Department and triage nurse.  Rituximab injection(Rituxan) What is this medicine? RITUXIMAB (ri TUX i mab) is a monoclonal antibody. It is used to treat certain types of cancer like non-Hodgkin lymphoma and chronic lymphocytic leukemia. It is also used to treat rheumatoid arthritis, granulomatosis with polyangiitis (or Wegener's granulomatosis), microscopic polyangiitis, and pemphigus vulgaris. This medicine may be used for other purposes; ask your health care provider or pharmacist if you have questions. COMMON BRAND NAME(S): Rituxan What should I tell my health care provider before I take this medicine? They need to know if you have any of these conditions: -heart disease -infection (especially a virus infection such as hepatitis B, chickenpox, cold sores, or herpes) -immune system problems -irregular heartbeat -kidney disease -low blood counts, like low white cell, platelet,  or red cell counts -lung or breathing disease, like asthma -recently received or scheduled to receive a vaccine -an unusual or allergic reaction to rituximab, other medicines, foods, dyes, or preservatives -pregnant or trying to get pregnant -breast-feeding How should I use this medicine? This medicine is for infusion into a vein. It is administered in a hospital or clinic by a specially trained health care professional. A special MedGuide will be given to you by the pharmacist with each prescription and refill. Be sure to read this information carefully each time. Talk to your pediatrician regarding the use of this medicine in children. This medicine is not approved for use in children. Overdosage: If you think you have taken too much of this medicine contact a poison control center or emergency room at once. NOTE: This medicine is only for you. Do not share this medicine with others. What if I miss a dose? It is important not to miss a dose. Call your doctor or health care professional if you are unable to keep an appointment. What may interact with this medicine? -cisplatin -live virus vaccines This list may not describe all possible interactions. Give your health care provider a list of all the medicines, herbs, non-prescription drugs, or dietary supplements you use. Also tell them if you smoke, drink alcohol, or use illegal drugs. Some items may interact with your medicine. What should I watch for while using this medicine? Your condition will be monitored carefully while you are receiving this medicine. You may need blood work done while you are taking this medicine. This medicine can cause serious allergic reactions. To reduce your risk you may need to take medicine before treatment with this medicine. Take your medicine as directed.  In some patients, this medicine may cause a serious brain infection that may cause death. If you have any problems seeing, thinking, speaking, walking, or  standing, tell your healthcare professional right away. If you cannot reach your healthcare professional, urgently seek other source of medical care. Call your doctor or health care professional for advice if you get a fever, chills or sore throat, or other symptoms of a cold or flu. Do not treat yourself. This drug decreases your body's ability to fight infections. Try to avoid being around people who are sick. Do not become pregnant while taking this medicine or for 12 months after stopping it. Women should inform their doctor if they wish to become pregnant or think they might be pregnant. There is a potential for serious side effects to an unborn child. Talk to your health care professional or pharmacist for more information. Do not breast-feed an infant while taking this medicine or for 6 months after stopping it. What side effects may I notice from receiving this medicine? Side effects that you should report to your doctor or health care professional as soon as possible: -allergic reactions like skin rash, itching or hives; swelling of the face, lips, or tongue -breathing problems -chest pain -changes in vision -diarrhea -headache with fever, neck stiffness, sensitivity to light, nausea, or confusion -fast, irregular heartbeat -loss of memory -low blood counts - this medicine may decrease the number of white blood cells, red blood cells and platelets. You may be at increased risk for infections and bleeding. -mouth sores -problems with balance, talking, or walking -redness, blistering, peeling or loosening of the skin, including inside the mouth -signs of infection - fever or chills, cough, sore throat, pain or difficulty passing urine -signs and symptoms of kidney injury like trouble passing urine or change in the amount of urine -signs and symptoms of liver injury like dark yellow or brown urine; general ill feeling or flu-like symptoms; light-colored stools; loss of appetite; nausea;  right upper belly pain; unusually weak or tired; yellowing of the eyes or skin -signs and symptoms of low blood pressure like dizziness; feeling faint or lightheaded, falls; unusually weak or tired -stomach pain -swelling of the ankles, feet, hands -unusual bleeding or bruising -vomiting Side effects that usually do not require medical attention (report to your doctor or health care professional if they continue or are bothersome): -headache -joint pain -muscle cramps or muscle pain -nausea -tiredness This list may not describe all possible side effects. Call your doctor for medical advice about side effects. You may report side effects to FDA at 1-800-FDA-1088. Where should I keep my medicine? This drug is given in a hospital or clinic and will not be stored at home. NOTE: This sheet is a summary. It may not cover all possible information. If you have questions about this medicine, talk to your doctor, pharmacist, or health care provider.  2019 Elsevier/Gold Standard (2017-03-26 13:04:32)

## 2018-09-02 ENCOUNTER — Telehealth: Payer: Commercial Managed Care - PPO | Admitting: Family Medicine

## 2018-09-02 ENCOUNTER — Telehealth: Payer: Self-pay | Admitting: *Deleted

## 2018-09-02 ENCOUNTER — Other Ambulatory Visit: Payer: Self-pay | Admitting: Family Medicine

## 2018-09-02 ENCOUNTER — Non-Acute Institutional Stay (HOSPITAL_COMMUNITY)
Admission: AD | Admit: 2018-09-02 | Discharge: 2018-09-02 | Disposition: A | Discharge status: Home / Self Care | Payer: Commercial Managed Care - PPO | Source: Ambulatory Visit | Attending: Oncology | Admitting: Oncology

## 2018-09-02 DIAGNOSIS — M311 Thrombotic microangiopathy: Secondary | ICD-10-CM | POA: Diagnosis present

## 2018-09-02 DIAGNOSIS — E1165 Type 2 diabetes mellitus with hyperglycemia: Secondary | ICD-10-CM

## 2018-09-02 LAB — CBC WITH DIFFERENTIAL/PLATELET
Abs Immature Granulocytes: 0.06 10*3/uL (ref 0.00–0.07)
Basophils Absolute: 0 10*3/uL (ref 0.0–0.1)
Basophils Relative: 0 %
Eosinophils Absolute: 0.1 10*3/uL (ref 0.0–0.5)
Eosinophils Relative: 1 %
HCT: 34.9 % — ABNORMAL LOW (ref 39.0–52.0)
Hemoglobin: 11.5 g/dL — ABNORMAL LOW (ref 13.0–17.0)
Immature Granulocytes: 1 %
Lymphocytes Relative: 13 %
Lymphs Abs: 1.3 10*3/uL (ref 0.7–4.0)
MCH: 34 pg (ref 26.0–34.0)
MCHC: 33 g/dL (ref 30.0–36.0)
MCV: 103.3 fL — ABNORMAL HIGH (ref 80.0–100.0)
Monocytes Absolute: 0.4 10*3/uL (ref 0.1–1.0)
Monocytes Relative: 3 %
Neutro Abs: 8.7 10*3/uL — ABNORMAL HIGH (ref 1.7–7.7)
Neutrophils Relative %: 82 %
Platelets: 209 10*3/uL (ref 150–400)
RBC: 3.38 MIL/uL — ABNORMAL LOW (ref 4.22–5.81)
RDW: 12.2 % (ref 11.5–15.5)
WBC: 10.7 10*3/uL — ABNORMAL HIGH (ref 4.0–10.5)
nRBC: 0 % (ref 0.0–0.2)

## 2018-09-02 LAB — POCT I-STAT EG7
Acid-base deficit: 1 mmol/L (ref 0.0–2.0)
Bicarbonate: 25.7 mmol/L (ref 20.0–28.0)
Calcium, Ion: 1.32 mmol/L (ref 1.15–1.40)
HCT: 49 % (ref 39.0–52.0)
Hemoglobin: 16.7 g/dL (ref 13.0–17.0)
O2 Saturation: 61 %
Potassium: 3.6 mmol/L (ref 3.5–5.1)
Sodium: 137 mmol/L (ref 135–145)
TCO2: 27 mmol/L (ref 22–32)
pCO2, Ven: 47.9 mmHg (ref 44.0–60.0)
pH, Ven: 7.337 (ref 7.250–7.430)
pO2, Ven: 34 mmHg (ref 32.0–45.0)

## 2018-09-02 LAB — COMPREHENSIVE METABOLIC PANEL
ALT: 70 U/L — ABNORMAL HIGH (ref 0–44)
AST: 25 U/L (ref 15–41)
Albumin: 3.3 g/dL — ABNORMAL LOW (ref 3.5–5.0)
Alkaline Phosphatase: 57 U/L (ref 38–126)
Anion gap: 10 (ref 5–15)
BUN: 10 mg/dL (ref 6–20)
CO2: 25 mmol/L (ref 22–32)
Calcium: 8.9 mg/dL (ref 8.9–10.3)
Chloride: 101 mmol/L (ref 98–111)
Creatinine, Ser: 0.82 mg/dL (ref 0.61–1.24)
GFR calc Af Amer: >60 mL/min (ref >60)
GFR calc non Af Amer: >60 mL/min (ref >60)
Glucose, Bld: 327 mg/dL — ABNORMAL HIGH (ref 70–99)
Potassium: 3.5 mmol/L (ref 3.5–5.1)
Sodium: 136 mmol/L (ref 135–145)
Total Bilirubin: 0.8 mg/dL (ref 0.3–1.2)
Total Protein: 5.4 g/dL — ABNORMAL LOW (ref 6.5–8.1)

## 2018-09-02 LAB — LACTATE DEHYDROGENASE: LDH: 130 U/L (ref 98–192)

## 2018-09-02 MED ORDER — ACD FORMULA A 0.73-2.45-2.2 GM/100ML VI SOLN
500.0000 mL | Status: DC
  Administered 2018-09-02 (×2): 500 mL via INTRAVENOUS

## 2018-09-02 MED ORDER — ACD FORMULA A 0.73-2.45-2.2 GM/100ML VI SOLN
Status: AC
  Administered 2018-09-02: 500 mL via INTRAVENOUS
  Filled 2018-09-02: qty 500

## 2018-09-02 MED ORDER — SODIUM CHLORIDE 0.9 % IV SOLN: 2.0000 g | Freq: Once | INTRAVENOUS | Status: DC

## 2018-09-02 MED ORDER — CALCIUM CARBONATE ANTACID 500 MG PO CHEW
CHEWABLE_TABLET | ORAL | Status: AC
  Administered 2018-09-02: 400 mg via ORAL
  Filled 2018-09-02: qty 1

## 2018-09-02 MED ORDER — ANTICOAGULANT SODIUM CITRATE 4% (200MG/5ML) IV SOLN
5.0000 mL | Freq: Once | Status: DC
  Filled 2018-09-02: qty 5

## 2018-09-02 MED ORDER — BLOOD GLUCOSE METER KIT: PACK | 0 refills | Status: AC

## 2018-09-02 MED ORDER — CALCIUM CARBONATE ANTACID 500 MG PO CHEW
2.0000 | CHEWABLE_TABLET | ORAL | Status: DC
  Administered 2018-09-02: 07:00:00 400 mg via ORAL

## 2018-09-02 MED ORDER — NICOTINE 21 MG/24HR TD PT24: 21.0000 mg | MEDICATED_PATCH | Freq: Every day | TRANSDERMAL | 0 refills | Status: DC

## 2018-09-02 MED ORDER — FAMOTIDINE IN NACL 20-0.9 MG/50ML-% IV SOLN
20.0000 mg | INTRAVENOUS | Status: AC
  Administered 2018-09-02: 20 mg via INTRAVENOUS
  Filled 2018-09-02: qty 50

## 2018-09-02 MED ORDER — CALCIUM GLUCONATE-NACL 2-0.675 GM/100ML-% IV SOLN
2.0000 g | Freq: Once | INTRAVENOUS | Status: DC
  Filled 2018-09-02: qty 100

## 2018-09-02 MED ORDER — DIPHENHYDRAMINE HCL 25 MG PO CAPS
ORAL_CAPSULE | ORAL | Status: AC
  Administered 2018-09-02: 25 mg via ORAL
  Filled 2018-09-02: qty 1

## 2018-09-02 MED ORDER — DIPHENHYDRAMINE HCL 25 MG PO CAPS
25.0000 mg | ORAL_CAPSULE | Freq: Four times a day (QID) | ORAL | Status: DC | PRN
  Administered 2018-09-02: 07:00:00 25 mg via ORAL

## 2018-09-02 MED ORDER — ACETAMINOPHEN 325 MG PO TABS: 650.0000 mg | ORAL_TABLET | ORAL | Status: DC | PRN

## 2018-09-02 NOTE — Telephone Encounter (Addendum)
Called to ask what to take for constipation. Instructed her to have him begin Colace 100 mg: #2 capsules daily and can increase to #2 bid if not effective. Add Miralax 17 grams bid till good BM and then decrease to daily.  Also asking for glucose meter kit and supplies. Informed her that his PCP gave him script for this on 08/25/18. He is not able to find it. They will call MD and ask for it to be called in since he lost his script. Also asking for nicotine patch to quit smoking. Per Dr. Benay Spice, this is OTC. Patient wants a script sent in so his insurance will pay for it.

## 2018-09-02 NOTE — Progress Notes (Unsigned)
[facility] and ordered supplies. Ottis Stain, CMA

## 2018-09-02 NOTE — Addendum Note (Signed)
Addended by: Tania Ade on: 09/02/2018 02:14 PM   Modules accepted: Orders

## 2018-09-02 NOTE — Progress Notes (Signed)
tx completed without complications; pt denies n/v, pain, paresthesia,dizziness or weakness.

## 2018-09-03 LAB — THERAPEUTIC PLASMA EXCHANGE (BLOOD BANK)
Plasma volume needed: 3400
Unit division: 0
Unit division: 0
Unit division: 0
Unit division: 0
Unit division: 0
Unit division: 0
Unit division: 0
Unit division: 0
Unit division: 0

## 2018-09-05 ENCOUNTER — Other Ambulatory Visit: Payer: Self-pay

## 2018-09-05 ENCOUNTER — Telehealth: Payer: Self-pay | Admitting: Oncology

## 2018-09-05 ENCOUNTER — Inpatient Hospital Stay: Payer: Commercial Managed Care - PPO

## 2018-09-05 ENCOUNTER — Inpatient Hospital Stay (HOSPITAL_BASED_OUTPATIENT_CLINIC_OR_DEPARTMENT_OTHER): Payer: Commercial Managed Care - PPO | Admitting: Oncology

## 2018-09-05 VITALS — BP 149/93 | HR 73 | Temp 98.6°F | Resp 18 | Ht 70.0 in | Wt 196.3 lb

## 2018-09-05 DIAGNOSIS — M311 Thrombotic microangiopathy: Secondary | ICD-10-CM

## 2018-09-05 DIAGNOSIS — Z452 Encounter for adjustment and management of vascular access device: Secondary | ICD-10-CM

## 2018-09-05 DIAGNOSIS — Z5112 Encounter for antineoplastic immunotherapy: Secondary | ICD-10-CM | POA: Diagnosis not present

## 2018-09-05 DIAGNOSIS — M3119 Other thrombotic microangiopathy: Secondary | ICD-10-CM

## 2018-09-05 DIAGNOSIS — I10 Essential (primary) hypertension: Secondary | ICD-10-CM | POA: Diagnosis not present

## 2018-09-05 DIAGNOSIS — K5900 Constipation, unspecified: Secondary | ICD-10-CM

## 2018-09-05 DIAGNOSIS — E119 Type 2 diabetes mellitus without complications: Secondary | ICD-10-CM | POA: Diagnosis not present

## 2018-09-05 DIAGNOSIS — D696 Thrombocytopenia, unspecified: Secondary | ICD-10-CM

## 2018-09-05 LAB — CBC WITH DIFFERENTIAL (CANCER CENTER ONLY)
Abs Immature Granulocytes: 0.02 10*3/uL (ref 0.00–0.07)
Basophils Absolute: 0.1 10*3/uL (ref 0.0–0.1)
Basophils Relative: 1 %
Eosinophils Absolute: 0.2 10*3/uL (ref 0.0–0.5)
Eosinophils Relative: 2 %
HCT: 35.7 % — ABNORMAL LOW (ref 39.0–52.0)
Hemoglobin: 11.9 g/dL — ABNORMAL LOW (ref 13.0–17.0)
Immature Granulocytes: 0 %
Lymphocytes Relative: 32 %
Lymphs Abs: 2.9 10*3/uL (ref 0.7–4.0)
MCH: 34 pg (ref 26.0–34.0)
MCHC: 33.3 g/dL (ref 30.0–36.0)
MCV: 102 fL — ABNORMAL HIGH (ref 80.0–100.0)
Monocytes Absolute: 0.6 10*3/uL (ref 0.1–1.0)
Monocytes Relative: 7 %
Neutro Abs: 5.1 10*3/uL (ref 1.7–7.7)
Neutrophils Relative %: 58 %
Platelet Count: 247 10*3/uL (ref 150–400)
RBC: 3.5 MIL/uL — ABNORMAL LOW (ref 4.22–5.81)
RDW: 12.1 % (ref 11.5–15.5)
WBC Count: 8.9 10*3/uL (ref 4.0–10.5)
nRBC: 0 % (ref 0.0–0.2)

## 2018-09-05 LAB — CMP (CANCER CENTER ONLY)
ALT: 65 U/L — ABNORMAL HIGH (ref 0–44)
AST: 15 U/L (ref 15–41)
Albumin: 3.6 g/dL (ref 3.5–5.0)
Alkaline Phosphatase: 71 U/L (ref 38–126)
Anion gap: 9 (ref 5–15)
BUN: 14 mg/dL (ref 6–20)
CO2: 27 mmol/L (ref 22–32)
Calcium: 9 mg/dL (ref 8.9–10.3)
Chloride: 103 mmol/L (ref 98–111)
Creatinine: 0.9 mg/dL (ref 0.61–1.24)
GFR, Est AFR Am: >60 mL/min (ref >60)
GFR, Estimated: >60 mL/min (ref >60)
Glucose, Bld: 256 mg/dL — ABNORMAL HIGH (ref 70–99)
Potassium: 3.8 mmol/L (ref 3.5–5.1)
Sodium: 139 mmol/L (ref 135–145)
Total Bilirubin: 0.7 mg/dL (ref 0.3–1.2)
Total Protein: 6.2 g/dL — ABNORMAL LOW (ref 6.5–8.1)

## 2018-09-05 LAB — LACTATE DEHYDROGENASE: LDH: 183 U/L (ref 98–192)

## 2018-09-05 MED ORDER — SODIUM CHLORIDE 0.9% FLUSH
10.0000 mL | Freq: Once | INTRAVENOUS | Status: AC
  Administered 2018-09-05: 10 mL
  Filled 2018-09-05: qty 10

## 2018-09-05 MED ORDER — ANTICOAGULANT SODIUM CITRATE 4% (200MG/5ML) IV SOLN
5.0000 mL | Freq: Once | Status: AC
  Administered 2018-09-05: 5 mL
  Filled 2018-09-05: qty 5

## 2018-09-05 MED ORDER — HEPARIN SOD (PORK) LOCK FLUSH 100 UNIT/ML IV SOLN
500.0000 [IU] | Freq: Once | INTRAVENOUS | Status: DC
  Filled 2018-09-05: qty 5

## 2018-09-05 MED ORDER — SODIUM CHLORIDE 0.9% FLUSH
10.0000 mL | Freq: Once | INTRAVENOUS | Status: DC
  Filled 2018-09-05: qty 10

## 2018-09-05 NOTE — Telephone Encounter (Signed)
Scheduled appt per 5/11 los.

## 2018-09-05 NOTE — Addendum Note (Signed)
Addended by: Carlene Coria L on: 09/05/2018 09:20 AM   Modules accepted: Orders

## 2018-09-05 NOTE — Progress Notes (Signed)
[facility] OFFICE PROGRESS NOTE   Diagnosis: TTP  INTERVAL HISTORY:   [name] returns for scheduled visit.  He completed a second treatment with rituximab on 09/01/2018.  He reports tolerating the treatment well.  No symptoms of an allergic reaction.  He last underwent plasmapheresis on 09/02/2018.  He also tolerated this procedure well. He denies bleeding and pain.  He complains of constipation for the past week.  He is now taking MiraLAX and a stool softener. His blood sugar has been running in the 200s.  He has discontinued "honey "from his diet.  He is currently maintained on prednisone at a dose of 30 mg daily.  Objective:  Vital signs in last 24 hours:  Blood pressure (!) 149/93, pulse 73, temperature 98.6 F (37 C), temperature source Oral, resp. rate 18, height 5\' 10"  (1.778 m), weight 196 lb 4.8 oz (89 kg), SpO2 100 %.    HEENT: No thrush Resp: Lungs clear bilaterally Cardio: Regular rate and rhythm GI: Soft, no hepatosplenomegaly, nontender Vascular: No leg edema   Portacath/PICC-without erythema  Lab Results:  Lab Results  Component Value Date   WBC 8.9 09/05/2018   HGB 11.9 (L) 09/05/2018   HCT 35.7 (L) 09/05/2018   MCV 102.0 (H) 09/05/2018   PLT 247 09/05/2018   NEUTROABS 5.1 09/05/2018    CMP  Lab Results  Component Value Date   NA 136 09/02/2018   K 3.5 09/02/2018   CL 101 09/02/2018   CO2 25 09/02/2018   GLUCOSE 327 (H) 09/02/2018   BUN 10 09/02/2018   CREATININE 0.82 09/02/2018   CALCIUM 8.9 09/02/2018   PROT 5.4 (L) 09/02/2018   ALBUMIN 3.3 (L) 09/02/2018   AST 25 09/02/2018   ALT 70 (H) 09/02/2018   ALKPHOS 57 09/02/2018   BILITOT 0.8 09/02/2018   GFRNONAA >60 09/02/2018   GFRAA >60 09/02/2018    Medications: I have reviewed the patient's current medications.   Assessment/Plan:  1. TTP initially diagnosed in October 2018 with recurrence in April 2020.  Initiation of daily plasma exchange and Solu-Medrol 02/02/2017   [name] Antibody- 72, activity less than 2% on initial presentation; [name] from 08/15/2018 <2.0.  Last plasma exchange 02/06/2017  Prednisone 60 mg daily beginning 02/08/2017  Prednisone 40 mg daily beginning 02/17/2017  Prednisone 30 mg daily beginning 03/09/2017  Prednisone 20 mg daily beginning 03/23/2017  Prednisone 10 mg daily beginning 04/01/2017  Prednisone 5 mg daily for 5 days then 5 mg every other day for 5 doses then stop beginning 05/11/2017  Recurrent TTP 08/14/2018  Plasma exchange/steroids started on 08/15/2018.  Plasma exchange discontinued after treatment on 08/19/2019, discharged on prednisone 60mg  daily  Prednisone taper to 40 mg daily 08/22/2018  Weekly rituximab for 4 doses beginning 08/25/2018  Plasma exchange resumed 08/26/2018, 08/27/2018, 08/28/2018, 08/29/2018, 08/31/2018, 09/02/2018  Prednisone taper to 30 mg daily 09/01/2018 2. Xiphoid pain-etiology unclear,resolved 3. Hypertension 4. Diabetes 5. Alcohol/Tobacco use 6.History of renal insufficiency 7. Right IJ dialysis catheter placed 02/02/2017, removed  New right IJ dialysis catheter 08/15/2018      Disposition: [name] appears well.  He is now in clinical remission from TTP.  We will follow-up on the chemistry panel and LDH from today.  He will return for week 3 rituximab on 09/08/2018.  Plasmapheresis has been placed on hold. The plan is to remove the dialysis catheter if his labs remain normal on 09/08/2018.  He will be scheduled for an office visit and rituximab on 09/15/2018.  [name] will continue MiraLAX and Colace for constipation.  He will use magnesium citrate if the constipation is not relieved over the next 1 to 2 days.  [name], MD  09/05/2018  8:42 AM

## 2018-09-08 ENCOUNTER — Inpatient Hospital Stay: Payer: Commercial Managed Care - PPO

## 2018-09-08 ENCOUNTER — Other Ambulatory Visit: Payer: Self-pay | Admitting: *Deleted

## 2018-09-08 ENCOUNTER — Other Ambulatory Visit: Payer: Self-pay

## 2018-09-08 ENCOUNTER — Telehealth: Payer: Self-pay | Admitting: *Deleted

## 2018-09-08 ENCOUNTER — Telehealth (INDEPENDENT_AMBULATORY_CARE_PROVIDER_SITE_OTHER): Payer: Commercial Managed Care - PPO | Admitting: Family Medicine

## 2018-09-08 VITALS — BP 134/79 | HR 66 | Temp 98.7°F | Resp 18

## 2018-09-08 DIAGNOSIS — M3119 Other thrombotic microangiopathy: Secondary | ICD-10-CM

## 2018-09-08 DIAGNOSIS — E1165 Type 2 diabetes mellitus with hyperglycemia: Secondary | ICD-10-CM

## 2018-09-08 DIAGNOSIS — M311 Thrombotic microangiopathy: Secondary | ICD-10-CM

## 2018-09-08 DIAGNOSIS — Z452 Encounter for adjustment and management of vascular access device: Secondary | ICD-10-CM

## 2018-09-08 DIAGNOSIS — D696 Thrombocytopenia, unspecified: Secondary | ICD-10-CM

## 2018-09-08 DIAGNOSIS — Z5112 Encounter for antineoplastic immunotherapy: Secondary | ICD-10-CM | POA: Diagnosis not present

## 2018-09-08 LAB — CMP (CANCER CENTER ONLY)
ALT: 73 U/L — ABNORMAL HIGH (ref 0–44)
AST: 18 U/L (ref 15–41)
Albumin: 3.6 g/dL (ref 3.5–5.0)
Alkaline Phosphatase: 79 U/L (ref 38–126)
Anion gap: 8 (ref 5–15)
BUN: 15 mg/dL (ref 6–20)
CO2: 29 mmol/L (ref 22–32)
Calcium: 9.4 mg/dL (ref 8.9–10.3)
Chloride: 101 mmol/L (ref 98–111)
Creatinine: 0.95 mg/dL (ref 0.61–1.24)
GFR, Est AFR Am: >60 mL/min (ref >60)
GFR, Estimated: >60 mL/min (ref >60)
Glucose, Bld: 269 mg/dL — ABNORMAL HIGH (ref 70–99)
Potassium: 3.6 mmol/L (ref 3.5–5.1)
Sodium: 138 mmol/L (ref 135–145)
Total Bilirubin: 0.8 mg/dL (ref 0.3–1.2)
Total Protein: 6.3 g/dL — ABNORMAL LOW (ref 6.5–8.1)

## 2018-09-08 LAB — RETICULOCYTES
Immature Retic Fract: 11.4 % (ref 2.3–15.9)
RBC.: 3.81 MIL/uL — ABNORMAL LOW (ref 4.22–5.81)
Retic Count, Absolute: 99.4 10*3/uL (ref 19.0–186.0)
Retic Ct Pct: 2.6 % (ref 0.4–3.1)

## 2018-09-08 LAB — CBC WITH DIFFERENTIAL (CANCER CENTER ONLY)
Abs Immature Granulocytes: 0.07 10*3/uL (ref 0.00–0.07)
Basophils Absolute: 0 10*3/uL (ref 0.0–0.1)
Basophils Relative: 0 %
Eosinophils Absolute: 0.2 10*3/uL (ref 0.0–0.5)
Eosinophils Relative: 2 %
HCT: 37.5 % — ABNORMAL LOW (ref 39.0–52.0)
Hemoglobin: 12.8 g/dL — ABNORMAL LOW (ref 13.0–17.0)
Immature Granulocytes: 1 %
Lymphocytes Relative: 19 %
Lymphs Abs: 2.7 10*3/uL (ref 0.7–4.0)
MCH: 34 pg (ref 26.0–34.0)
MCHC: 34.1 g/dL (ref 30.0–36.0)
MCV: 99.5 fL (ref 80.0–100.0)
Monocytes Absolute: 0.7 10*3/uL (ref 0.1–1.0)
Monocytes Relative: 5 %
Neutro Abs: 10.9 10*3/uL — ABNORMAL HIGH (ref 1.7–7.7)
Neutrophils Relative %: 73 %
Platelet Count: 236 10*3/uL (ref 150–400)
RBC: 3.77 MIL/uL — ABNORMAL LOW (ref 4.22–5.81)
RDW: 12 % (ref 11.5–15.5)
WBC Count: 14.6 10*3/uL — ABNORMAL HIGH (ref 4.0–10.5)
nRBC: 0 % (ref 0.0–0.2)

## 2018-09-08 LAB — LACTATE DEHYDROGENASE: LDH: 179 U/L (ref 98–192)

## 2018-09-08 MED ORDER — SODIUM CHLORIDE 0.9 % IV SOLN
Freq: Once | INTRAVENOUS | Status: AC
  Administered 2018-09-08: 09:00:00 via INTRAVENOUS
  Filled 2018-09-08: qty 250

## 2018-09-08 MED ORDER — FAMOTIDINE IN NACL 20-0.9 MG/50ML-% IV SOLN
20.0000 mg | Freq: Once | INTRAVENOUS | Status: AC
  Administered 2018-09-08: 10:00:00 20 mg via INTRAVENOUS

## 2018-09-08 MED ORDER — SODIUM CHLORIDE 0.9% FLUSH
10.0000 mL | Freq: Once | INTRAVENOUS | Status: AC
  Administered 2018-09-08: 10 mL
  Filled 2018-09-08: qty 10

## 2018-09-08 MED ORDER — ANTICOAGULANT SODIUM CITRATE 4% (200MG/5ML) IV SOLN
5.0000 mL | Freq: Once | Status: DC
  Filled 2018-09-08: qty 5

## 2018-09-08 MED ORDER — ACETAMINOPHEN 325 MG PO TABS
650.0000 mg | ORAL_TABLET | Freq: Once | ORAL | Status: AC
  Administered 2018-09-08: 650 mg via ORAL

## 2018-09-08 MED ORDER — HEPARIN SOD (PORK) LOCK FLUSH 100 UNIT/ML IV SOLN
500.0000 [IU] | Freq: Once | INTRAVENOUS | Status: AC
  Administered 2018-09-08: 09:00:00 500 [IU]
  Filled 2018-09-08: qty 5

## 2018-09-08 MED ORDER — ACETAMINOPHEN 325 MG PO TABS
ORAL_TABLET | ORAL | Status: AC
  Filled 2018-09-08: qty 2

## 2018-09-08 MED ORDER — FAMOTIDINE IN NACL 20-0.9 MG/50ML-% IV SOLN
INTRAVENOUS | Status: AC
  Filled 2018-09-08: qty 50

## 2018-09-08 MED ORDER — DIPHENHYDRAMINE HCL 25 MG PO CAPS
ORAL_CAPSULE | ORAL | Status: AC
  Filled 2018-09-08: qty 2

## 2018-09-08 MED ORDER — DIPHENHYDRAMINE HCL 25 MG PO CAPS
50.0000 mg | ORAL_CAPSULE | Freq: Once | ORAL | Status: AC
  Administered 2018-09-08: 50 mg via ORAL

## 2018-09-08 MED ORDER — PREDNISONE 20 MG PO TABS: 20.0000 mg | ORAL_TABLET | Freq: Every day | ORAL | 0 refills | Status: DC

## 2018-09-08 MED ORDER — SODIUM CHLORIDE 0.9 % IV SOLN
375.0000 mg/m2 | Freq: Once | INTRAVENOUS | Status: AC
  Administered 2018-09-08: 800 mg via INTRAVENOUS
  Filled 2018-09-08: qty 30

## 2018-09-08 NOTE — Progress Notes (Unsigned)
Critical ADAMS 13 activity (A13AC) of 2.7 given to Dr. Benay Spice at [phone]

## 2018-09-08 NOTE — Progress Notes (Signed)
[facility] Telemedicine Visit  Patient consented to have virtual visit. Method of visit: Telephone  Encounter participants: Patient: Eric Hooper - located at home Provider: Matilde Haymaker - located at [facility] clinic Others (if applicable): Mother  Chief Complaint: Elevated blood sugar  HPI:  Eric Hooper reports that he had an elevated blood sugar on 5/13.  He noted that he was feeling lightheaded in the middle of the day on 5/13 after drinking a vitamin water and decided to check his bloodsugar.  At that time, his CBG was over 500.  He noted that he was also experiencing excessive urination and going to the bathroom about every 10 minutes.  He also noted that he was experiencing excessive thirst.  Once he realized how elevated his blood sugar was, he rested on the couch for the remainder of the day checking his blood sugar every 2 hours.  He reports that his blood sugar slightly decreased until it reached a low of 217.  At nighttime during the day did he experience any significant confusion or disorientation.  His blood sugar on the morning of 5/14 was 164.  He ate breakfast, skipped lunch and checked his blood sugar in the early afternoon to find it around 170.  During the day on 5/14, he has not noticed any significant increased urination or thirst. He denies confusion/disorientation.  ROS: per HPI  Pertinent PMHx: History of DVT PE, currently on a steroid taper.  Current steroids include prednisone 20 mg daily.  Exam:  General: Awake, alert and oriented.  Engaged in the conversation asking critical questions. Respiratory: Able to complete long sentences without difficulty.  No shortness of breath noted.  No wheezing noted.  Assessment/Plan:  Uncontrolled diabetes mellitus ([place]) Blood glucose appears to be better controlled today on 5/14.  No concern for DKA/HHS at this time.  Significant time was spent discussing appropriate diabetic and nutrition and monitoring  his food and drink intake. -Ambulatory referral to diabetic nutrition education -Increase metformin to 1000 mg twice daily -I will call Eric Hooper in 2 days to reassess his blood glucose control and consider adding glipizide with meals -Continue to closely monitor blood sugar during prednisone taper -Return to clinic in 1 month for follow-up appointment regarding diabetes  TTP (thrombotic thrombocytopenic purpura) (HCC) Currently following with hematology/oncology. -Continue prednisone taper per oncology recommendations -Follow-up with oncology regarding PCP prophylaxis with long-term steroid use     Time spent during visit with patient: 20 minutes

## 2018-09-08 NOTE — Patient Instructions (Addendum)
Glad that we could review a lot of information about your diabetes today.  He was a quick summary of the important points of our conversation:  Medication-increase your metformin to 1000 mg twice daily.  1000 mg in the morning and 1000 mg at night.  If you begin to experience nausea, stomach discomfort, diarrhea then decrease your nighttime metformin to 500 mg.  I will reach out to you again in 2 days to check your morning blood sugar and to consider adding an additional medication.  Antibiotics because of long-term steroid use-please ask your hematologist/oncologist about the need for antibiotics in the setting of your long-term steroid use.  There may be a specific reason that you were not started on these antibiotics.  Diabetic nutrition-we were able to discuss a number of specific questions over the phone.  The website that has good general information about nutrition is VRemover.com.ee.  I have attached additional general diabetic nutrition information below.   Diabetes Mellitus and Nutrition, Adult When you have diabetes (diabetes mellitus), it is very important to have healthy eating habits because your blood sugar (glucose) levels are greatly affected by what you eat and drink. Eating healthy foods in the appropriate amounts, at about the same times every day, can help you:  Control your blood glucose.  Lower your risk of heart disease.  Improve your blood pressure.  Reach or maintain a healthy weight. Every person with diabetes is different, and each person has different needs for a meal plan. Your health care provider may recommend that you work with a diet and nutrition specialist (dietitian) to make a meal plan that is best for you. Your meal plan may vary depending on factors such as:  The calories you need.  The medicines you take.  Your weight.  Your blood glucose, blood pressure, and cholesterol levels.  Your activity level.  Other health conditions you have, such as  heart or kidney disease. How do carbohydrates affect me? Carbohydrates, also called carbs, affect your blood glucose level more than any other type of food. Eating carbs naturally raises the amount of glucose in your blood. Carb counting is a method for keeping track of how many carbs you eat. Counting carbs is important to keep your blood glucose at a healthy level, especially if you use insulin or take certain oral diabetes medicines. It is important to know how many carbs you can safely have in each meal. This is different for every person. Your dietitian can help you calculate how many carbs you should have at each meal and for each snack. Foods that contain carbs include:  Bread, cereal, rice, pasta, and crackers.  Potatoes and corn.  Peas, beans, and lentils.  Milk and yogurt.  Fruit and juice.  Desserts, such as cakes, cookies, ice cream, and candy. How does alcohol affect me? Alcohol can cause a sudden decrease in blood glucose (hypoglycemia), especially if you use insulin or take certain oral diabetes medicines. Hypoglycemia can be a life-threatening condition. Symptoms of hypoglycemia (sleepiness, dizziness, and confusion) are similar to symptoms of having too much alcohol. If your health care provider says that alcohol is safe for you, follow these guidelines:  Limit alcohol intake to no more than 1 drink per day for nonpregnant women and 2 drinks per day for men. One drink equals 12 oz of beer, 5 oz of wine, or 1 oz of hard liquor.  Do not drink on an empty stomach.  Keep yourself hydrated with water, diet soda, or  unsweetened iced tea.  Keep in mind that regular soda, juice, and other mixers may contain a lot of sugar and must be counted as carbs. What are tips for following this plan?  Reading food labels  Start by checking the serving size on the "Nutrition Facts" label of packaged foods and drinks. The amount of calories, carbs, fats, and other nutrients listed on the  label is based on one serving of the item. Many items contain more than one serving per package.  Check the total grams (g) of carbs in one serving. You can calculate the number of servings of carbs in one serving by dividing the total carbs by 15. For example, if a food has 30 g of total carbs, it would be equal to 2 servings of carbs.  Check the number of grams (g) of saturated and trans fats in one serving. Choose foods that have low or no amount of these fats.  Check the number of milligrams (mg) of salt (sodium) in one serving. Most people should limit total sodium intake to less than 2,300 mg per day.  Always check the nutrition information of foods labeled as "low-fat" or "nonfat". These foods may be higher in added sugar or refined carbs and should be avoided.  Talk to your dietitian to identify your daily goals for nutrients listed on the label. Shopping  Avoid buying canned, premade, or processed foods. These foods tend to be high in fat, sodium, and added sugar.  Shop around the outside edge of the grocery store. This includes fresh fruits and vegetables, bulk grains, fresh meats, and fresh dairy. Cooking  Use low-heat cooking methods, such as baking, instead of high-heat cooking methods like deep frying.  Cook using healthy oils, such as olive, canola, or sunflower oil.  Avoid cooking with butter, cream, or high-fat meats. Meal planning  Eat meals and snacks regularly, preferably at the same times every day. Avoid going long periods of time without eating.  Eat foods high in fiber, such as fresh fruits, vegetables, beans, and whole grains. Talk to your dietitian about how many servings of carbs you can eat at each meal.  Eat 4-6 ounces (oz) of lean protein each day, such as lean meat, chicken, fish, eggs, or tofu. One oz of lean protein is equal to: ? 1 oz of meat, chicken, or fish. ? 1 egg. ?  cup of tofu.  Eat some foods each day that contain healthy fats, such as  avocado, nuts, seeds, and fish. Lifestyle  Check your blood glucose regularly.  Exercise regularly as told by your health care provider. This may include: ? 150 minutes of moderate-intensity or vigorous-intensity exercise each week. This could be brisk walking, biking, or water aerobics. ? Stretching and doing strength exercises, such as yoga or weightlifting, at least 2 times a week.  Take medicines as told by your health care provider.  Do not use any products that contain nicotine or tobacco, such as cigarettes and e-cigarettes. If you need help quitting, ask your health care provider.  Work with a Social worker or diabetes educator to identify strategies to manage stress and any emotional and social challenges. Questions to ask a health care provider  Do I need to meet with a diabetes educator?  Do I need to meet with a dietitian?  What number can I call if I have questions?  When are the best times to check my blood glucose? Where to find more information:  American Diabetes Association: diabetes.org  Academy  of Nutrition and Dietetics: www.eatright.[employer] of Diabetes and Digestive and Kidney Diseases (NIH): [employer] Summary  A healthy meal plan will help you control your blood glucose and maintain a healthy lifestyle.  Working with a diet and nutrition specialist (dietitian) can help you make a meal plan that is best for you.  Keep in mind that carbohydrates (carbs) and alcohol have immediate effects on your blood glucose levels. It is important to count carbs and to use alcohol carefully. This information is not intended to replace advice given to you by your health care provider. Make sure you discuss any questions you have with your health care provider. Document Released: 01/08/2005 Document Revised: 11/11/2016 Document Reviewed: 05/18/2016 Elsevier Interactive Patient Education  2019 [employer].

## 2018-09-08 NOTE — Assessment & Plan Note (Addendum)
Currently following with hematology/oncology. -Continue prednisone taper per oncology recommendations -Follow-up with oncology regarding PCP prophylaxis with long-term steroid use

## 2018-09-08 NOTE — Telephone Encounter (Signed)
Called to ask if MD wants to taper his prednisone down more from 30 mg daily. Per Dr. Benay Spice, decrease to 20 mg daily. Sent in refill.

## 2018-09-08 NOTE — Patient Instructions (Signed)
Coronavirus (COVID-19) Are you at risk?  Are you at risk for the Coronavirus (COVID-19)?  To be considered HIGH RISK for Coronavirus (COVID-19), you have to meet the following criteria:  . Traveled to [place], [place], [place], [place] or [place]; or in the [place] to [place], [place], [place], or [place]; and have fever, cough, and shortness of breath within the last 2 weeks of travel OR . Been in close contact with a person diagnosed with COVID-19 within the last 2 weeks and have fever, cough, and shortness of breath . IF YOU DO NOT MEET THESE CRITERIA, YOU ARE CONSIDERED LOW RISK FOR COVID-19.  What to do if you are HIGH RISK for COVID-19?  . If you are having a medical emergency, call 911. . Seek medical care right away. Before you go to a doctor's office, urgent care or emergency department, call ahead and tell them about your recent travel, contact with someone diagnosed with COVID-19, and your symptoms. You should receive instructions from your physician's office regarding next steps of care.  . When you arrive at healthcare provider, tell the healthcare staff immediately you have returned from visiting [place], [place], [place], [place] or [place]; or traveled in the [place] to [place], [place], [place], or [place]; in the last two weeks or you have been in close contact with a person diagnosed with COVID-19 in the last 2 weeks.   . Tell the health care staff about your symptoms: fever, cough and shortness of breath. . After you have been seen by a medical provider, you will be either: o Tested for (COVID-19) and discharged home on quarantine except to seek medical care if symptoms worsen, and asked to  - Stay home and avoid contact with others until you get your results (4-5 days)  - Avoid travel on public transportation if possible (such as bus, train, or airplane) or o Sent to the Emergency Department by EMS for evaluation, COVID-19 testing, and possible  admission depending on your condition and test results.  What to do if you are LOW RISK for COVID-19?  Reduce your risk of any infection by using the same precautions used for avoiding the common cold or flu:  . Wash your hands often with soap and warm water for at least 20 seconds.  If soap and water are not readily available, use an alcohol-based hand sanitizer with at least 60% alcohol.  . If coughing or sneezing, cover your mouth and nose by coughing or sneezing into the elbow areas of your shirt or coat, into a tissue or into your sleeve (not your hands). . Avoid shaking hands with others and consider head nods or verbal greetings only. . Avoid touching your eyes, nose, or mouth with unwashed hands.  . Avoid close contact with people who are sick. . Avoid places or events with large numbers of people in one location, like concerts or sporting events. . Carefully consider travel plans you have or are making. . If you are planning any travel outside or inside the [place], visit the CDC's Travelers' Health webpage for the latest health notices. . If you have some symptoms but not all symptoms, continue to monitor at home and seek medical attention if your symptoms worsen. . If you are having a medical emergency, call 911.   ADDITIONAL HEALTHCARE OPTIONS FOR PATIENTS  [facility] / e-Visit: [email or web address]         [facility]: [phone]  Onset   Urgent Care: [facility]: [facility] Discharge Instructions for Patients Receiving Chemotherapy  Today you received the following chemotherapy agents Rituxan  To help prevent nausea and vomiting after your treatment, we encourage you to take your nausea medication as directed.    If you develop nausea and vomiting that is not controlled by your nausea medication, call the clinic.   BELOW ARE  SYMPTOMS THAT SHOULD BE REPORTED IMMEDIATELY:  *FEVER GREATER THAN 100.5 F  *CHILLS WITH OR WITHOUT FEVER  NAUSEA AND VOMITING THAT IS NOT CONTROLLED WITH YOUR NAUSEA MEDICATION  *UNUSUAL SHORTNESS OF BREATH  *UNUSUAL BRUISING OR BLEEDING  TENDERNESS IN MOUTH AND THROAT WITH OR WITHOUT PRESENCE OF ULCERS  *URINARY PROBLEMS  *BOWEL PROBLEMS  UNUSUAL RASH Items with * indicate a potential emergency and should be followed up as soon as possible.  Feel free to call the clinic should you have any questions or concerns. The clinic phone number is [phone].  Please show the [facility] at check-in to the Emergency Department and triage nurse.

## 2018-09-08 NOTE — Assessment & Plan Note (Addendum)
Blood glucose appears to be better controlled today on 5/14.  No concern for DKA/HHS at this time.  Significant time was spent discussing appropriate diabetic and nutrition and monitoring his food and drink intake. -Ambulatory referral to diabetic nutrition education -Increase metformin to 1000 mg twice daily -I will call Eric Hooper in 2 days to reassess his blood glucose control and consider adding glipizide with meals -Continue to closely monitor blood sugar during prednisone taper -Return to clinic in 1 month for follow-up appointment regarding diabetes

## 2018-09-08 NOTE — Progress Notes (Signed)
Per Dr. Benay Spice: Labs on 09/12/18 and if results are good, can arrange to remove his pheresis cath. Lab orders entered and high priority scheduling message sent.

## 2018-09-09 LAB — ADAMTS13 ACTIVITY: Adamts 13 Activity: 2.7 % — CL (ref >66.8)

## 2018-09-09 LAB — ADAMTS13 ANTIBODY: ADAMTS13 Antibody: 58 Units/mL — ABNORMAL HIGH (ref <12)

## 2018-09-11 ENCOUNTER — Other Ambulatory Visit: Payer: Self-pay | Admitting: Oncology

## 2018-09-11 ENCOUNTER — Telehealth: Payer: Self-pay | Admitting: Family Medicine

## 2018-09-11 NOTE — Telephone Encounter (Signed)
I spoke with Eric Hooper briefly on the phone regarding his blood glucose.  He reports that he has been checking his blood glucose three times daily (am fasting, lunch and evening). His most recent CGBs are as follows:   5/16 am fasting 165 5/16 pm 221 5/17 am fasting 127  He reports that he has not had any additional episodes of elevated blood glucose above 400.   He was informed that these CBGs are appropriate considering his current steroid regimen and he should continue his current diabetes treatment. He was told that he only needed to check his am fasting CBG unless he felt unwell and suspected high blood sugar. He was informed that a referral to diabetic nutrition was placed and he should expect a call in the coming week.

## 2018-09-12 ENCOUNTER — Other Ambulatory Visit: Payer: Self-pay | Admitting: *Deleted

## 2018-09-12 ENCOUNTER — Other Ambulatory Visit: Payer: Self-pay

## 2018-09-12 ENCOUNTER — Encounter: Payer: Self-pay | Admitting: *Deleted

## 2018-09-12 ENCOUNTER — Inpatient Hospital Stay: Payer: Commercial Managed Care - PPO

## 2018-09-12 ENCOUNTER — Telehealth: Payer: Self-pay

## 2018-09-12 ENCOUNTER — Ambulatory Visit (INDEPENDENT_AMBULATORY_CARE_PROVIDER_SITE_OTHER): Payer: Commercial Managed Care - PPO | Admitting: Cardiology

## 2018-09-12 ENCOUNTER — Encounter: Payer: Self-pay | Admitting: Cardiology

## 2018-09-12 VITALS — Wt 199.5 lb

## 2018-09-12 DIAGNOSIS — D696 Thrombocytopenia, unspecified: Secondary | ICD-10-CM

## 2018-09-12 DIAGNOSIS — M311 Thrombotic microangiopathy: Secondary | ICD-10-CM

## 2018-09-12 DIAGNOSIS — R0789 Other chest pain: Secondary | ICD-10-CM

## 2018-09-12 DIAGNOSIS — E1169 Type 2 diabetes mellitus with other specified complication: Secondary | ICD-10-CM

## 2018-09-12 DIAGNOSIS — E785 Hyperlipidemia, unspecified: Secondary | ICD-10-CM | POA: Diagnosis not present

## 2018-09-12 DIAGNOSIS — M3119 Other thrombotic microangiopathy: Secondary | ICD-10-CM

## 2018-09-12 DIAGNOSIS — R079 Chest pain, unspecified: Secondary | ICD-10-CM

## 2018-09-12 DIAGNOSIS — Z452 Encounter for adjustment and management of vascular access device: Secondary | ICD-10-CM

## 2018-09-12 DIAGNOSIS — Z5112 Encounter for antineoplastic immunotherapy: Secondary | ICD-10-CM | POA: Diagnosis not present

## 2018-09-12 LAB — CBC WITH DIFFERENTIAL (CANCER CENTER ONLY)
Abs Immature Granulocytes: 0.02 10*3/uL (ref 0.00–0.07)
Basophils Absolute: 0 10*3/uL (ref 0.0–0.1)
Basophils Relative: 0 %
Eosinophils Absolute: 0.2 10*3/uL (ref 0.0–0.5)
Eosinophils Relative: 2 %
HCT: 41.1 % (ref 39.0–52.0)
Hemoglobin: 13.6 g/dL (ref 13.0–17.0)
Immature Granulocytes: 0 %
Lymphocytes Relative: 23 %
Lymphs Abs: 2.2 10*3/uL (ref 0.7–4.0)
MCH: 33.1 pg (ref 26.0–34.0)
MCHC: 33.1 g/dL (ref 30.0–36.0)
MCV: 100 fL (ref 80.0–100.0)
Monocytes Absolute: 0.5 10*3/uL (ref 0.1–1.0)
Monocytes Relative: 5 %
Neutro Abs: 6.4 10*3/uL (ref 1.7–7.7)
Neutrophils Relative %: 70 %
Platelet Count: 200 10*3/uL (ref 150–400)
RBC: 4.11 MIL/uL — ABNORMAL LOW (ref 4.22–5.81)
RDW: 11.9 % (ref 11.5–15.5)
WBC Count: 9.3 10*3/uL (ref 4.0–10.5)
nRBC: 0 % (ref 0.0–0.2)

## 2018-09-12 LAB — CMP (CANCER CENTER ONLY)
ALT: 53 U/L — ABNORMAL HIGH (ref 0–44)
AST: 15 U/L (ref 15–41)
Albumin: 3.8 g/dL (ref 3.5–5.0)
Alkaline Phosphatase: 65 U/L (ref 38–126)
Anion gap: 8 (ref 5–15)
BUN: 17 mg/dL (ref 6–20)
CO2: 27 mmol/L (ref 22–32)
Calcium: 9.7 mg/dL (ref 8.9–10.3)
Chloride: 104 mmol/L (ref 98–111)
Creatinine: 1.01 mg/dL (ref 0.61–1.24)
GFR, Est AFR Am: >60 mL/min (ref >60)
GFR, Estimated: >60 mL/min (ref >60)
Glucose, Bld: 238 mg/dL — ABNORMAL HIGH (ref 70–99)
Potassium: 3.7 mmol/L (ref 3.5–5.1)
Sodium: 139 mmol/L (ref 135–145)
Total Bilirubin: 0.7 mg/dL (ref 0.3–1.2)
Total Protein: 6.3 g/dL — ABNORMAL LOW (ref 6.5–8.1)

## 2018-09-12 LAB — LACTATE DEHYDROGENASE: LDH: 156 U/L (ref 98–192)

## 2018-09-12 MED ORDER — HEPARIN SOD (PORK) LOCK FLUSH 100 UNIT/ML IV SOLN
500.0000 [IU] | Freq: Once | INTRAVENOUS | Status: DC
  Filled 2018-09-12: qty 5

## 2018-09-12 MED ORDER — SODIUM CHLORIDE 0.9% FLUSH
10.0000 mL | Freq: Once | INTRAVENOUS | Status: AC
  Administered 2018-09-12: 10 mL
  Filled 2018-09-12: qty 10

## 2018-09-12 MED ORDER — ANTICOAGULANT SODIUM CITRATE 4% (200MG/5ML) IV SOLN
5.0000 mL | Freq: Once | Status: AC
  Administered 2018-09-12: 5 mL
  Filled 2018-09-12: qty 5

## 2018-09-12 NOTE — Progress Notes (Signed)
Virtual Visit via Video Note   This visit type was conducted due to national recommendations for restrictions regarding the COVID-19 Pandemic (e.g. social distancing) in an effort to limit this patient's exposure and mitigate transmission in our community.  Due to his co-morbid illnesses, this patient is at least at moderate risk for complications without adequate follow up.  This format is felt to be most appropriate for this patient at this time.  All issues noted in this document were discussed and addressed.  A limited physical exam was performed with this format.  Please refer to the patient's chart for his consent to telehealth for [facility]   Date:  09/12/2018   ID:  Eric Hooper, DOB 11-08-1975, MRN [id number]  Patient Location: Home Provider Location: Home  PCP:  Eric Hollow, MD  Cardiologist:  No primary care provider on file. Eric Hooper Electrophysiologist:  None   Evaluation Performed:  Consultation - Eric Hooper was referred by Dr. Wendy Poet for the evaluation of chest pain.  Chief Complaint:  Chest pain  History of Present Illness:    Eric Hooper is a 43 y.o. male with diabetes and thrombotic thrombocytopenic purpura, TTP on prednisone taper with prior history of here for the evaluation of chest pain. [name] Treated with rituximab on 09/01/2018.  Underwent plasmapheresis on 09/02/2018.  Quit tob  During the initial hospitalization/discharge 08/19/2018, he complained of dull burning centralized chest discomfort with left shoulder radiation.  An echocardiogram was performed that showed normal ejection fraction 55 to 60%.  Previously when he had TTP, he felt similar discomfort.  Troponins were 0.03.  Currently states that he occasionally will have right-sided chest discomfort nagging and throbbing at times.  Comes and goes.  Sometimes it is on the left side as well.  Nonexertional.  Wonders if it is heartburn/GERD.  Today had his right IJ catheter  removed.  Bandage noted  Denies any fevers chills nausea vomiting syncope bleeding    The patient does not have symptoms concerning for COVID-19 infection (fever, chills, cough, or new shortness of breath).    Past Medical History:  Diagnosis Date   Controlled diabetes mellitus type 2 with complications ([facility]) 65/07/6501   Diabetes mellitus without complication ([facility])    non-insulin dependent   Diverticulosis 02/01/2017   on CT scan abd/pelvis   GERD (gastroesophageal reflux disease)    Hypertension    Kidney stones 02/03/2017   Mixed hyperlipidemia 02/28/2018   Smoker 04/13/2017   Past Surgical History:  Procedure Laterality Date   ELBOW SURGERY Right    IR FLUORO GUIDE CV LINE RIGHT  02/02/2017   IR US GUIDE VASC ACCESS RIGHT  02/02/2017     Current Meds  Medication Sig   amLODipine (NORVASC) 10 MG tablet Take 1 tablet (10 mg total) by mouth Hooper.   atorvastatin (LIPITOR) 40 MG tablet Take 1 tablet (40 mg total) by mouth Hooper.   docusate sodium (COLACE) 100 MG capsule Take 200 mg by mouth 2 (two) times Hooper.   esomeprazole (NEXIUM) 40 MG capsule Take 1 capsule (40 mg total) by mouth 2 (two) times Hooper before a meal.   metFORMIN (GLUCOPHAGE) 1000 MG tablet Take 1 tablet (1,000 mg total) by mouth Hooper with breakfast. (Patient taking differently: Take 1,000 mg by mouth 2 (two) times Hooper. )   nicotine (NICODERM CQ) 21 mg/24hr patch Place 1 patch (21 mg total) onto the skin Hooper.   polyethylene glycol powder (MIRALAX) 17 GM/SCOOP powder Take 1  Container by mouth 2 (two) times a day.   predniSONE (DELTASONE) 20 MG tablet Take 1 tablet (20 mg total) by mouth Hooper with breakfast.     Allergies:   Ibuprofen   Social History   Tobacco Use   Smoking status: Current Every Day Smoker    Packs/day: 0.50    Years: 15.00    Pack years: 7.50    Types: Cigarettes   Smokeless tobacco: Never Used  Substance Use Topics   Alcohol use: Yes    Comment: 3-4  beers per day most days   Drug use: No     Family Hx: The patient's family history includes Colon cancer in his maternal aunt; Hyperlipidemia in his mother; Hypertension in his father and mother.  ROS:   Please see the history of present illness.     All other systems reviewed and are negative.   Prior CV studies:   The following studies were reviewed today:  Echocardiogram 08/15/2018: Normal EF 55 to 60%  Labs/Other Tests and Data Reviewed:    EKG:  An ECG dated 08/18/2018 was personally reviewed today and demonstrated:  Sinus rhythm with nonspecific ST-T wave changes.  Recent Labs: 02/25/2018: TSH 0.399 09/12/2018: ALT 53; BUN 17; Creatinine 1.01; Hemoglobin 13.6; Platelet Count 200; Potassium 3.7; Sodium 139   Recent Lipid Panel Lab Results  Component Value Date/Time   CHOL 200 (H) 02/25/2018 04:50 PM   TRIG 283 (H) 02/25/2018 04:50 PM   HDL 39 (L) 02/25/2018 04:50 PM   CHOLHDL 5.1 (H) 02/25/2018 04:50 PM   LDLCALC 104 (H) 02/25/2018 04:50 PM    Wt Readings from Last 3 Encounters:  09/12/18 199 lb 8 oz (90.5 kg)  09/05/18 196 lb 4.8 oz (89 kg)  08/27/18 199 lb (90.3 kg)     Objective:    Vital Signs:  Wt 199 lb 8 oz (90.5 kg)    BMI 28.63 kg/m    VITAL SIGNS:  reviewed GEN:  no acute distress EYES:  sclerae anicteric, EOMI - Extraocular Movements Intact RESPIRATORY:  normal respiratory effort, symmetric expansion SKIN:  no rash, lesions or ulcers. MUSCULOSKELETAL:  no obvious deformities. NEURO:  alert and oriented x 3, no obvious focal deficit PSYCH:  normal affect  ASSESSMENT & PLAN:    Chest pain in the setting of TTP - This occurred previously during his prior bout of TTP. -Troponin levels were drawn and were 0.03, did not rise.  Overall reassuring.  ECG on 08/18/2018 showed sinus rhythm with nonspecific T wave changes.  No obvious ischemic changes noted.  Echocardiogram was also reassuring with normal ejection fraction. - It is certainly plausible and  documented in the literature that microvascular thrombotic complications can occur in the setting of TTP and usually do not involve the larger epicardial arteries. In the setting of TTP, if troponin I is significantly elevated, 2 or greater in some studies, this greatly increases mortality.  Thankfully in this situation, troponin was not markedly elevated.  Continue to treat underlying TTP with steroid taper.  Since there is no evidence of heart failure, no need for ACE inhibitor or beta-blocker.  Continue with supportive care. - Current discomfort that he occasionally will feel, described today for instance is right sided sometimes left-sided sometimes migrates, quite atypical and likely myofascial.  Encourage stretching.  Also remember that prednisone can occasionally cause esophageal irritation.  Diabetes with hyperlipidemia - Consider addition of ACE inhibitor for renal protection. -Excellent use of atorvastatin high intensity dose.  COVID-19 Education: The signs and symptoms of COVID-19 were discussed with the patient and how to seek care for testing (follow up with PCP or arrange E-visit).  The importance of social distancing was discussed today.  Time:   Today, I have spent 20 minutes with the patient with telehealth technology discussing the above problems.     Medication Adjustments/Labs and Tests Ordered: Current medicines are reviewed at length with the patient today.  Concerns regarding medicines are outlined above.   Tests Ordered: No orders of the defined types were placed in this encounter.   Medication Changes: No orders of the defined types were placed in this encounter.   Disposition:  Follow up prn  Signed, Candee Furbish, MD  09/12/2018 2:24 PM    [place]

## 2018-09-12 NOTE — Progress Notes (Signed)
Pheresis cath D/C'd today after MD review of counts and LDH. Patient tolerated procedure well. After lying supine for 35 minutes, BP 129/83 and pulse 58. Dressing at right clavicular site is clean, dry and intact. He will remove after 24 hours and will call for any bleeding.

## 2018-09-12 NOTE — Patient Instructions (Signed)
Medication Instructions:  The current medical regimen is effective;  continue present plan and medications.  If you need a refill on your cardiac medications before your next appointment, please call your pharmacy.   Follow-Up: Follow up as needed with Dr Skains.  Thank you for choosing [facility]!!      

## 2018-09-12 NOTE — Progress Notes (Signed)
MD reviewed labs today. OK to remove pheresis cath. Called IV team and they will remove the cath today in an exam room

## 2018-09-12 NOTE — Telephone Encounter (Signed)
YOUR CARDIOLOGY TEAM HAS ARRANGED FOR AN E-VISIT FOR YOUR APPOINTMENT - PLEASE REVIEW IMPORTANT INFORMATION BELOW SEVERAL DAYS PRIOR TO YOUR APPOINTMENT  Due to the recent COVID-19 pandemic, we are transitioning in-person office visits to tele-medicine visits in an effort to decrease unnecessary exposure to our patients, their families, and staff. These visits are billed to your insurance just like a normal visit is. We also encourage you to sign up for MyChart if you have not already done so. You will need a smartphone if possible. For patients that do not have this, we can still complete the visit using a regular telephone but do prefer a smartphone to enable video when possible. You may have a family member that lives with you that can help. If possible, we also ask that you have a blood pressure cuff and scale at home to measure your blood pressure, heart rate and weight prior to your scheduled appointment. Patients with clinical needs that need an in-person evaluation and testing will still be able to come to the office if absolutely necessary. If you have any questions, feel free to call our office.     YOUR PROVIDER WILL BE USING THE FOLLOWING PLATFORM TO COMPLETE YOUR VISIT: [email or web address]  . IF USING MYCHART - How to Download the MyChart App to Your SmartPhone   - If Apple, go to App Store and type in MyChart in the search bar and download the app. If Android, ask patient to go to Google Play Store and type in MyChart in the search bar and download the app. The app is free but as with any other app downloads, your phone may require you to verify saved payment information or Apple/Android password.  - You will need to then log into the app with your MyChart username and password, and select [facility] as your healthcare provider to link the account.  - When it is time for your visit, go to the MyChart app, find appointments, and click Begin Video Visit. Be sure to Select Allow for your device to  access the Microphone and Camera for your visit. You will then be connected, and your provider will be with you shortly.  **If you have any issues connecting or need assistance, please contact MyChart service desk [phone] ([phone])**  **If using a computer, in order to ensure the best quality for your visit, you will need to use either of the following Internet Browsers: Google Chrome or Microsoft Edge**  . IF USING DOXIMITY or [email or web address] - The staff will give you instructions on receiving your link to join the meeting the day of your visit.      2-3 DAYS BEFORE YOUR APPOINTMENT  You will receive a telephone call from one of our [facility] team members - your caller ID may say "Unknown caller." If this is a video visit, we will walk you through how to get the video launched on your phone. We will remind you check your blood pressure, heart rate and weight prior to your scheduled appointment. If you have an Apple Watch or Kardia, please upload any pertinent ECG strips the day before or morning of your appointment to MyChart. Our staff will also make sure you have reviewed the consent and agree to move forward with your scheduled tele-health visit.     THE DAY OF YOUR APPOINTMENT  Approximately 15 minutes prior to your scheduled appointment, you will receive a telephone call from one of HeartCare team - your caller ID may say "Unknown caller."    Our staff will confirm medications, vital signs for the day and any symptoms you may be experiencing. Please have this information available prior to the time of visit start. It may also be helpful for you to have a pad of paper and pen handy for any instructions given during your visit. They will also walk you through joining the smartphone meeting if this is a video visit.    CONSENT FOR TELE-HEALTH VISIT - PLEASE REVIEW  I hereby voluntarily request, consent and authorize [facility] and its employed or contracted physicians, physician  assistants, nurse practitioners or other licensed health care professionals (the Practitioner), to provide me with telemedicine health care services (the "Services") as deemed necessary by the treating Practitioner. I acknowledge and consent to receive the Services by the Practitioner via telemedicine. I understand that the telemedicine visit will involve communicating with the Practitioner through live audiovisual communication technology and the disclosure of certain medical information by electronic transmission. I acknowledge that I have been given the opportunity to request an in-person assessment or other available alternative prior to the telemedicine visit and am voluntarily participating in the telemedicine visit.  I understand that I have the right to withhold or withdraw my consent to the use of telemedicine in the course of my care at any time, without affecting my right to future care or treatment, and that the Practitioner or I may terminate the telemedicine visit at any time. I understand that I have the right to inspect all information obtained and/or recorded in the course of the telemedicine visit and may receive copies of available information for a reasonable fee.  I understand that some of the potential risks of receiving the Services via telemedicine include:  . Delay or interruption in medical evaluation due to technological equipment failure or disruption; . Information transmitted may not be sufficient (e.g. poor resolution of images) to allow for appropriate medical decision making by the Practitioner; and/or  . In rare instances, security protocols could fail, causing a breach of personal health information.  Furthermore, I acknowledge that it is my responsibility to provide information about my medical history, conditions and care that is complete and accurate to the best of my ability. I acknowledge that Practitioner's advice, recommendations, and/or decision may be based on  factors not within their control, such as incomplete or inaccurate data provided by me or distortions of diagnostic images or specimens that may result from electronic transmissions. I understand that the practice of medicine is not an exact science and that Practitioner makes no warranties or guarantees regarding treatment outcomes. I acknowledge that I will receive a copy of this consent concurrently upon execution via email to the email address I last provided but may also request a printed copy by calling the office of [facility].    I understand that my insurance will be billed for this visit.   I have read or had this consent read to me. . I understand the contents of this consent, which adequately explains the benefits and risks of the Services being provided via telemedicine.  . I have been provided ample opportunity to ask questions regarding this consent and the Services and have had my questions answered to my satisfaction. . I give my informed consent for the services to be provided through the use of telemedicine in my medical care  By participating in this telemedicine visit I agree to the above.  

## 2018-09-15 ENCOUNTER — Telehealth: Payer: Self-pay | Admitting: Nurse Practitioner

## 2018-09-15 ENCOUNTER — Inpatient Hospital Stay: Payer: Commercial Managed Care - PPO

## 2018-09-15 ENCOUNTER — Inpatient Hospital Stay (HOSPITAL_BASED_OUTPATIENT_CLINIC_OR_DEPARTMENT_OTHER): Payer: Commercial Managed Care - PPO | Admitting: Nurse Practitioner

## 2018-09-15 ENCOUNTER — Other Ambulatory Visit: Payer: Commercial Managed Care - PPO

## 2018-09-15 ENCOUNTER — Other Ambulatory Visit: Payer: Self-pay

## 2018-09-15 ENCOUNTER — Encounter: Payer: Self-pay | Admitting: Nurse Practitioner

## 2018-09-15 VITALS — BP 123/77 | HR 55 | Temp 98.2°F | Resp 20

## 2018-09-15 VITALS — BP 127/82 | HR 76 | Temp 98.1°F | Resp 18 | Ht 70.0 in | Wt 194.4 lb

## 2018-09-15 DIAGNOSIS — Z5112 Encounter for antineoplastic immunotherapy: Secondary | ICD-10-CM | POA: Diagnosis not present

## 2018-09-15 DIAGNOSIS — R079 Chest pain, unspecified: Secondary | ICD-10-CM

## 2018-09-15 DIAGNOSIS — M311 Thrombotic microangiopathy: Secondary | ICD-10-CM | POA: Diagnosis not present

## 2018-09-15 DIAGNOSIS — M3119 Other thrombotic microangiopathy: Secondary | ICD-10-CM

## 2018-09-15 DIAGNOSIS — E119 Type 2 diabetes mellitus without complications: Secondary | ICD-10-CM

## 2018-09-15 DIAGNOSIS — I10 Essential (primary) hypertension: Secondary | ICD-10-CM

## 2018-09-15 DIAGNOSIS — R740 Nonspecific elevation of levels of transaminase and lactic acid dehydrogenase [LDH]: Secondary | ICD-10-CM

## 2018-09-15 LAB — CBC WITH DIFFERENTIAL (CANCER CENTER ONLY)
Abs Immature Granulocytes: 0.03 10*3/uL (ref 0.00–0.07)
Basophils Absolute: 0 10*3/uL (ref 0.0–0.1)
Basophils Relative: 0 %
Eosinophils Absolute: 0.1 10*3/uL (ref 0.0–0.5)
Eosinophils Relative: 1 %
HCT: 39.9 % (ref 39.0–52.0)
Hemoglobin: 13.3 g/dL (ref 13.0–17.0)
Immature Granulocytes: 0 %
Lymphocytes Relative: 12 %
Lymphs Abs: 1.2 10*3/uL (ref 0.7–4.0)
MCH: 32.5 pg (ref 26.0–34.0)
MCHC: 33.3 g/dL (ref 30.0–36.0)
MCV: 97.6 fL (ref 80.0–100.0)
Monocytes Absolute: 0.3 10*3/uL (ref 0.1–1.0)
Monocytes Relative: 3 %
Neutro Abs: 8.2 10*3/uL — ABNORMAL HIGH (ref 1.7–7.7)
Neutrophils Relative %: 84 %
Platelet Count: 189 10*3/uL (ref 150–400)
RBC: 4.09 MIL/uL — ABNORMAL LOW (ref 4.22–5.81)
RDW: 11.8 % (ref 11.5–15.5)
WBC Count: 9.8 10*3/uL (ref 4.0–10.5)
nRBC: 0 % (ref 0.0–0.2)

## 2018-09-15 LAB — CMP (CANCER CENTER ONLY)
ALT: 54 U/L — ABNORMAL HIGH (ref 0–44)
AST: 14 U/L — ABNORMAL LOW (ref 15–41)
Albumin: 3.9 g/dL (ref 3.5–5.0)
Alkaline Phosphatase: 73 U/L (ref 38–126)
Anion gap: 9 (ref 5–15)
BUN: 16 mg/dL (ref 6–20)
CO2: 27 mmol/L (ref 22–32)
Calcium: 9.8 mg/dL (ref 8.9–10.3)
Chloride: 103 mmol/L (ref 98–111)
Creatinine: 0.91 mg/dL (ref 0.61–1.24)
GFR, Est AFR Am: >60 mL/min (ref >60)
GFR, Estimated: >60 mL/min (ref >60)
Glucose, Bld: 185 mg/dL — ABNORMAL HIGH (ref 70–99)
Potassium: 4 mmol/L (ref 3.5–5.1)
Sodium: 139 mmol/L (ref 135–145)
Total Bilirubin: 0.7 mg/dL (ref 0.3–1.2)
Total Protein: 6.6 g/dL (ref 6.5–8.1)

## 2018-09-15 LAB — RETICULOCYTES
Immature Retic Fract: 6 % (ref 2.3–15.9)
RBC.: 4.11 MIL/uL — ABNORMAL LOW (ref 4.22–5.81)
Retic Count, Absolute: 41.5 10*3/uL (ref 19.0–186.0)
Retic Ct Pct: 1 % (ref 0.4–3.1)

## 2018-09-15 LAB — LACTATE DEHYDROGENASE: LDH: 193 U/L — ABNORMAL HIGH (ref 98–192)

## 2018-09-15 MED ORDER — SODIUM CHLORIDE 0.9 % IV SOLN
Freq: Once | INTRAVENOUS | Status: AC
  Administered 2018-09-15: 12:00:00 via INTRAVENOUS
  Filled 2018-09-15: qty 250

## 2018-09-15 MED ORDER — DIPHENHYDRAMINE HCL 25 MG PO CAPS
ORAL_CAPSULE | ORAL | Status: AC
  Filled 2018-09-15: qty 2

## 2018-09-15 MED ORDER — ACETAMINOPHEN 325 MG PO TABS
650.0000 mg | ORAL_TABLET | Freq: Once | ORAL | Status: AC
  Administered 2018-09-15: 12:00:00 650 mg via ORAL

## 2018-09-15 MED ORDER — FAMOTIDINE IN NACL 20-0.9 MG/50ML-% IV SOLN
INTRAVENOUS | Status: AC
  Filled 2018-09-15: qty 50

## 2018-09-15 MED ORDER — DIPHENHYDRAMINE HCL 25 MG PO CAPS
50.0000 mg | ORAL_CAPSULE | Freq: Once | ORAL | Status: AC
  Administered 2018-09-15: 50 mg via ORAL

## 2018-09-15 MED ORDER — FAMOTIDINE IN NACL 20-0.9 MG/50ML-% IV SOLN
20.0000 mg | Freq: Once | INTRAVENOUS | Status: AC
  Administered 2018-09-15: 13:00:00 20 mg via INTRAVENOUS

## 2018-09-15 MED ORDER — SODIUM CHLORIDE 0.9 % IV SOLN
375.0000 mg/m2 | Freq: Once | INTRAVENOUS | Status: AC
  Administered 2018-09-15: 14:00:00 800 mg via INTRAVENOUS
  Filled 2018-09-15: qty 50

## 2018-09-15 MED ORDER — ACETAMINOPHEN 325 MG PO TABS
ORAL_TABLET | ORAL | Status: AC
  Filled 2018-09-15: qty 2

## 2018-09-15 NOTE — Telephone Encounter (Signed)
Scheduled appt per 5/21 los.  Left a voice messgae of scheduled appt date and time.

## 2018-09-15 NOTE — Patient Instructions (Signed)
[facility] Discharge Instructions for Patients Receiving Chemotherapy  Today you received the following chemotherapy agents Rituxan  To help prevent nausea and vomiting after your treatment, we encourage you to take your nausea medication as directed by your MD.   If you develop nausea and vomiting that is not controlled by your nausea medication, call the clinic.   BELOW ARE SYMPTOMS THAT SHOULD BE REPORTED IMMEDIATELY:  *FEVER GREATER THAN 100.5 F  *CHILLS WITH OR WITHOUT FEVER  NAUSEA AND VOMITING THAT IS NOT CONTROLLED WITH YOUR NAUSEA MEDICATION  *UNUSUAL SHORTNESS OF BREATH  *UNUSUAL BRUISING OR BLEEDING  TENDERNESS IN MOUTH AND THROAT WITH OR WITHOUT PRESENCE OF ULCERS  *URINARY PROBLEMS  *BOWEL PROBLEMS  UNUSUAL RASH Items with * indicate a potential emergency and should be followed up as soon as possible.  Feel free to call the clinic should you have any questions or concerns. The clinic phone number is [phone].  Please show the CHEMO ALERT CARD at check-in to the Emergency Department and triage nurse.  Coronavirus (COVID-19) Are you at risk?  Are you at risk for the Coronavirus (COVID-19)?  To be considered HIGH RISK for Coronavirus (COVID-19), you have to meet the following criteria:  . Traveled to [place], [place], [place], [place] or [place]; or in the [place] to [place], [place], [place], or [place]; and have fever, cough, and shortness of breath within the last 2 weeks of travel OR . Been in close contact with a person diagnosed with COVID-19 within the last 2 weeks and have fever, cough, and shortness of breath . IF YOU DO NOT MEET THESE CRITERIA, YOU ARE CONSIDERED LOW RISK FOR COVID-19.  What to do if you are HIGH RISK for COVID-19?  . If you are having a medical emergency, call 911. . Seek medical care right away. Before you go to a doctor's office, urgent care or emergency department, call ahead and tell them about  your recent travel, contact with someone diagnosed with COVID-19, and your symptoms. You should receive instructions from your physician's office regarding next steps of care.  . When you arrive at healthcare provider, tell the healthcare staff immediately you have returned from visiting [place], [place], [place], [place] or [place]; or traveled in the [place] to [place], [place], [place], or [place]; in the last two weeks or you have been in close contact with a person diagnosed with COVID-19 in the last 2 weeks.   . Tell the health care staff about your symptoms: fever, cough and shortness of breath. . After you have been seen by a medical provider, you will be either: o Tested for (COVID-19) and discharged home on quarantine except to seek medical care if symptoms worsen, and asked to  - Stay home and avoid contact with others until you get your results (4-5 days)  - Avoid travel on public transportation if possible (such as bus, train, or airplane) or o Sent to the Emergency Department by EMS for evaluation, COVID-19 testing, and possible admission depending on your condition and test results.  What to do if you are LOW RISK for COVID-19?  Reduce your risk of any infection by using the same precautions used for avoiding the common cold or flu:  . Wash your hands often with soap and warm water for at least 20 seconds.  If soap and water are not readily available, use an alcohol-based hand sanitizer with at least 60% alcohol.  . If   coughing or sneezing, cover your mouth and nose by coughing or sneezing into the elbow areas of your shirt or coat, into a tissue or into your sleeve (not your hands). . Avoid shaking hands with others and consider head nods or verbal greetings only. . Avoid touching your eyes, nose, or mouth with unwashed hands.  . Avoid close contact with people who are sick. . Avoid places or events with large numbers of people in one location, like concerts or sporting  events. . Carefully consider travel plans you have or are making. . If you are planning any travel outside or inside the [place], visit the CDC's Travelers' Health webpage for the latest health notices. . If you have some symptoms but not all symptoms, continue to monitor at home and seek medical attention if your symptoms worsen. . If you are having a medical emergency, call 911.   ADDITIONAL HEALTHCARE OPTIONS FOR PATIENTS   Telehealth / e-Visit: [email or web address]         [facility]: [phone]  [facility]: [phone]                   [facility]: [phone]    

## 2018-09-15 NOTE — Progress Notes (Addendum)
[facility] OFFICE PROGRESS NOTE   Diagnosis: TTP  INTERVAL HISTORY:   Eric Hooper returns as scheduled.  He continues prednisone 20 mg daily.  He overall feels well.  No unusual headaches.  No diplopia.  Vision is "blurry" at times.  He denies bleeding.  No leg swelling or calf pain.  He denies shortness of breath.  He continues to have chest discomfort.  He was evaluated by Dr. Marlou Porch 09/12/2018.  Pain atypical and likely myofascial.  He has a good appetite.  No signs of allergic reaction with the most recent Rituxan.  Objective:  Vital signs in last 24 hours:  Blood pressure 127/82, pulse 76, temperature 98.1 F (36.7 C), temperature source Oral, resp. rate 18, height 5\' 10"  (1.778 m), weight 194 lb 6.4 oz (88.2 kg), SpO2 99 %.    HEENT: No thrush or ulcers. GI: Abdomen soft and nontender.  No hepatosplenomegaly. Vascular: No leg edema.   Lab Results:  Lab Results  Component Value Date   WBC 9.8 09/15/2018   HGB 13.3 09/15/2018   HCT 39.9 09/15/2018   MCV 97.6 09/15/2018   PLT 189 09/15/2018   NEUTROABS 8.2 (H) 09/15/2018  LDH 193, retake count 1%, creatinine 0.91, bilirubin 0.7  Imaging:  No results found.  Medications: I have reviewed the patient's current medications.  Assessment/Plan:  1. TTP initially diagnosed in October 2018 with recurrence in April 2020.  Initiation of daily plasma exchange and Solu-Medrol 02/02/2017  [name] Antibody- 72, activity less than 2% on initial presentation; [name] from 08/15/2018 <2.0.  Last plasma exchange 02/06/2017  Prednisone 60 mg daily beginning 02/08/2017  Prednisone 40 mg daily beginning 02/17/2017  Prednisone 30 mg daily beginning 03/09/2017  Prednisone 20 mg daily beginning 03/23/2017  Prednisone 10 mg daily beginning 04/01/2017  Prednisone 5 mg daily for 5 days then 5 mg every other day for 5 doses then stop beginning 05/11/2017  Recurrent TTP 08/14/2018  Plasma exchange/steroids started  on 08/15/2018.  Plasma exchange discontinued after treatment on 08/19/2019, discharged on prednisone 60mg  daily  Prednisone taper to 40 mg daily 08/22/2018  Weekly rituximab for 4 doses beginning 08/25/2018  Plasma exchange resumed 08/26/2018, 08/27/2018, 08/28/2018, 08/29/2018, 08/31/2018, 09/02/2018  Prednisone taper to 30 mg daily 09/01/2018  Cycle 3 weekly Rituxan 09/08/2018  Prednisone taper to 20 mg daily 09/08/2018  Cycle 4 weekly Rituxan 09/15/2018 2. Xiphoid pain-etiology unclear,resolved 3. Hypertension 4. Diabetes 5. Alcohol/Tobacco use 6.History of renal insufficiency 7. Right IJ dialysis catheter placed 02/02/2017, removed  New right IJ dialysis catheter 08/15/2018  Catheter removed 09/12/2018    Disposition: Eric Hooper appears stable.  He has completed 3 cycles of weekly Rituxan.  Plan to proceed with the fourth and final weekly Rituxan today as scheduled.  He will continue prednisone 20 mg daily.  We reviewed the labs from today.  Hemoglobin and platelets remain in normal range.  LDH is mildly elevated.  He will return for repeat labs 09/20/2018.  He will return for lab and follow-up on 09/27/2018.  He will contact the office in the interim with any problems.   Patient seen with Dr. Benay Spice.  Ned Card ANP/GNP-BC   09/15/2018  11:25 AM  This was a shared visit with Ned Card.  Eric Hooper is in clinical remission from the TTP.  The markers of hemolysis remain normal other than an LDH at the upper limit of normal.  The [name] remains low.  He will complete a final treatment with rituximab today.  He  will call for any symptoms.  We will monitor the CBC and hemolysis markers closely. Julieanne Manson, MD

## 2018-09-16 ENCOUNTER — Telehealth: Payer: Self-pay | Admitting: *Deleted

## 2018-09-16 LAB — ADAMTS13 ANTIBODY: ADAMTS13 Antibody: 94 Units/mL — ABNORMAL HIGH (ref <12)

## 2018-09-16 LAB — ADAMTS13 ACTIVITY: Adamts 13 Activity: 2.5 % — CL

## 2018-09-16 NOTE — Telephone Encounter (Signed)
Wanted clarification on lab results, that "something" was high? Reviewed labs with Eric Hooper and reported that LDH and ANC were above normal, but minimum. MD was pleased with results at this time. F/U for labs on 5/26 as scheduled.

## 2018-09-20 ENCOUNTER — Other Ambulatory Visit: Payer: Self-pay

## 2018-09-20 ENCOUNTER — Telehealth: Payer: Self-pay | Admitting: *Deleted

## 2018-09-20 ENCOUNTER — Ambulatory Visit (INDEPENDENT_AMBULATORY_CARE_PROVIDER_SITE_OTHER): Payer: Commercial Managed Care - PPO | Admitting: Family Medicine

## 2018-09-20 ENCOUNTER — Inpatient Hospital Stay: Payer: Commercial Managed Care - PPO

## 2018-09-20 DIAGNOSIS — Z5112 Encounter for antineoplastic immunotherapy: Secondary | ICD-10-CM | POA: Diagnosis not present

## 2018-09-20 DIAGNOSIS — M3119 Other thrombotic microangiopathy: Secondary | ICD-10-CM

## 2018-09-20 DIAGNOSIS — M311 Thrombotic microangiopathy: Secondary | ICD-10-CM

## 2018-09-20 DIAGNOSIS — E1165 Type 2 diabetes mellitus with hyperglycemia: Secondary | ICD-10-CM | POA: Diagnosis not present

## 2018-09-20 LAB — RETICULOCYTES
Immature Retic Fract: 7 % (ref 2.3–15.9)
RBC.: 4.24 MIL/uL (ref 4.22–5.81)
Retic Count, Absolute: 44.9 10*3/uL (ref 19.0–186.0)
Retic Ct Pct: 1.1 % (ref 0.4–3.1)

## 2018-09-20 LAB — CMP (CANCER CENTER ONLY)
ALT: 44 U/L (ref 0–44)
AST: 14 U/L — ABNORMAL LOW (ref 15–41)
Albumin: 3.9 g/dL (ref 3.5–5.0)
Alkaline Phosphatase: 70 U/L (ref 38–126)
Anion gap: 9 (ref 5–15)
BUN: 13 mg/dL (ref 6–20)
CO2: 29 mmol/L (ref 22–32)
Calcium: 9.6 mg/dL (ref 8.9–10.3)
Chloride: 104 mmol/L (ref 98–111)
Creatinine: 0.97 mg/dL (ref 0.61–1.24)
GFR, Est AFR Am: >60 mL/min (ref >60)
GFR, Estimated: >60 mL/min (ref >60)
Glucose, Bld: 127 mg/dL — ABNORMAL HIGH (ref 70–99)
Potassium: 4 mmol/L (ref 3.5–5.1)
Sodium: 142 mmol/L (ref 135–145)
Total Bilirubin: 0.6 mg/dL (ref 0.3–1.2)
Total Protein: 6.6 g/dL (ref 6.5–8.1)

## 2018-09-20 LAB — CBC WITH DIFFERENTIAL (CANCER CENTER ONLY)
Abs Immature Granulocytes: 0.02 10*3/uL (ref 0.00–0.07)
Basophils Absolute: 0 10*3/uL (ref 0.0–0.1)
Basophils Relative: 0 %
Eosinophils Absolute: 0.2 10*3/uL (ref 0.0–0.5)
Eosinophils Relative: 2 %
HCT: 42.1 % (ref 39.0–52.0)
Hemoglobin: 13.8 g/dL (ref 13.0–17.0)
Immature Granulocytes: 0 %
Lymphocytes Relative: 42 %
Lymphs Abs: 3.9 10*3/uL (ref 0.7–4.0)
MCH: 32.7 pg (ref 26.0–34.0)
MCHC: 32.8 g/dL (ref 30.0–36.0)
MCV: 99.8 fL (ref 80.0–100.0)
Monocytes Absolute: 0.7 10*3/uL (ref 0.1–1.0)
Monocytes Relative: 8 %
Neutro Abs: 4.3 10*3/uL (ref 1.7–7.7)
Neutrophils Relative %: 48 %
Platelet Count: 204 10*3/uL (ref 150–400)
RBC: 4.22 MIL/uL (ref 4.22–5.81)
RDW: 11.9 % (ref 11.5–15.5)
WBC Count: 9.1 10*3/uL (ref 4.0–10.5)
nRBC: 0 % (ref 0.0–0.2)

## 2018-09-20 LAB — LACTATE DEHYDROGENASE: LDH: 161 U/L (ref 98–192)

## 2018-09-20 MED ORDER — PREDNISONE 20 MG PO TABS: 10.0000 mg | ORAL_TABLET | Freq: Every day | ORAL | 0 refills | Status: DC

## 2018-09-20 NOTE — Patient Instructions (Addendum)
  Diet Recommendations for Diabetes  Carbohydrate includes starch, sugar, and fiber.  Of these, only sugar and starch raise blood glucose.  (Fiber is found in fruits, vegetables [especially skin, seeds, and stalks] and whole grains.)   Starchy (carb) foods: Bread, rice, pasta, potatoes, corn, cereal, grits, crackers, bagels, muffins, all baked goods.  (Fruit, milk, and yogurt also have carbohydrate, but most of these foods will not spike your blood sugar as most starchy foods will.)  A few fruits do cause high blood sugars; use small portions of bananas (limit to 1/2 at a time), grapes, watermelon, oranges, and most tropical fruits.   Protein foods: Meat, fish, poultry, eggs, dairy foods, and beans such as pinto and kidney beans (beans also provide carbohydrate).   1. Eat at least REAL 3 meals and 1-2 snacks per day. Never go more than 4-5 hours while awake without eating. Eat breakfast within the first hour of getting up.   2. Limit starchy foods to TWO per meal and ONE per snack. ONE portion of a starchy food is equal to the following:   - ONE slice of bread (or its equivalent, such as half of a hamburger bun).   - 1/2 cup of a "scoopable" starchy food such as potatoes or rice.   - 15 grams of Total Carbohydrate as shown on food label.   - Every 4 ounces of a sweet drink (including fruit juice) 3. Include at every meal: a protein food, a carb food, and vegetables and/or fruit.   - Obtain twice the volume of veg's as protein or carbohydrate foods for both lunch and dinner.   - Fresh or frozen veg's are best.   - Keep frozen veg's on hand for a quick vegetable serving.      A good milk alternative is soy milk.  This is the only dairy alternative with protein.  (If you make a smoothie with soy milk, use a full cup.)

## 2018-09-20 NOTE — Progress Notes (Signed)
Telehealth Encounter I connected with Raad Clayson (MRN [id number]) on 09/20/2018 by MyChart video-enabled, HIPAA-compliant telemedicine application (but ultimately switched to telephone).  I verified that I am speaking with the correct person using two identifiers, and that the patient was in a private environment conducive to confidentiality.  I discussed the limitations of evaluation and management by telemedicine. The patient expressed understanding and agreed to proceed.  Provider was Kennith Center, PhD, RD, LDN, CEDRD Provider(s) located at home during this telehealth encounter; patient was at home, and his wife was present and participated in the call  Appt start time: 1100 end time: 1200 (1 hour)  Reason for telehealth visit: Follow-up of Medical Nutrition Therapy for poorly controlled DM (E11.65) per PCP Matilde Haymaker, MD.   Relevant history/background: Mr. Gee reported extreme thirst, frequent urination, and a BG of 500 recently (while on a tapered course of prednisone).  He saw Dr. Pilar Plate on 5/14, who provided education on managing his BG.  His FBG has recently been running 130s to 150s, with a couple at 105.  He confirmed that he is taking his metformin as directed by Dr. Pilar Plate on 5/14 - 1000 mg bid.      Assessment:  Usual eating pattern: 2 meals and 0-1 snack per day.  Works from 6:30 AM to 3:30-4:30 PM.   Frequent foods and beverages: water; salads, chx, steak, [place] sandwiches.   Avoided foods: beans (allergy; gets itchy), milk and yogurt (lactose intol).   Usual physical activity: none; goes fishing on ~2 X wk. Sleep: Estimates he gets ~8 hrs/night.   Intervention: Reviewed principles of dietary management of diabetes, and with his approval, set specific behavioral goals for Mr. Penner.    For Goals and Recommendations, see Patient Instructions.   Follow-up:  Telehealth visit in 4 weeks, June 23 at 2 PM.   Iver Nestle

## 2018-09-20 NOTE — Telephone Encounter (Signed)
MD reviewed labs today and patient notified. Decrease prednisone to 10 mg daily. Follow up on 09/27/18 as scheduled.

## 2018-09-21 ENCOUNTER — Telehealth: Payer: Self-pay | Admitting: *Deleted

## 2018-09-21 NOTE — Telephone Encounter (Signed)
Patient called to report [employer] had faxed request to [place] for medical records to be sent to them to work on his disability last week and they have not heard from our office yet. Asking for records to be sent as soon as possible and to let him know when they have been sent. Expresses concern that he has not been paid in a month. Will make Cecille Rubin, disability specialist aware of his call and request.

## 2018-09-25 ENCOUNTER — Other Ambulatory Visit: Payer: Self-pay | Admitting: Family Medicine

## 2018-09-25 DIAGNOSIS — E1165 Type 2 diabetes mellitus with hyperglycemia: Secondary | ICD-10-CM

## 2018-09-27 ENCOUNTER — Inpatient Hospital Stay (HOSPITAL_BASED_OUTPATIENT_CLINIC_OR_DEPARTMENT_OTHER): Payer: Commercial Managed Care - PPO | Admitting: Oncology

## 2018-09-27 ENCOUNTER — Other Ambulatory Visit: Payer: Self-pay

## 2018-09-27 ENCOUNTER — Inpatient Hospital Stay: Payer: Commercial Managed Care - PPO | Attending: Oncology

## 2018-09-27 VITALS — BP 120/72 | HR 85 | Temp 98.9°F | Resp 18 | Ht 70.0 in | Wt 189.5 lb

## 2018-09-27 DIAGNOSIS — E119 Type 2 diabetes mellitus without complications: Secondary | ICD-10-CM

## 2018-09-27 DIAGNOSIS — R0789 Other chest pain: Secondary | ICD-10-CM | POA: Insufficient documentation

## 2018-09-27 DIAGNOSIS — M311 Thrombotic microangiopathy: Secondary | ICD-10-CM | POA: Diagnosis present

## 2018-09-27 DIAGNOSIS — H538 Other visual disturbances: Secondary | ICD-10-CM

## 2018-09-27 DIAGNOSIS — I10 Essential (primary) hypertension: Secondary | ICD-10-CM

## 2018-09-27 DIAGNOSIS — M3119 Other thrombotic microangiopathy: Secondary | ICD-10-CM

## 2018-09-27 LAB — CBC WITH DIFFERENTIAL (CANCER CENTER ONLY)
Abs Immature Granulocytes: 0.04 10*3/uL (ref 0.00–0.07)
Basophils Absolute: 0 10*3/uL (ref 0.0–0.1)
Basophils Relative: 0 %
Eosinophils Absolute: 0.1 10*3/uL (ref 0.0–0.5)
Eosinophils Relative: 1 %
HCT: 42.6 % (ref 39.0–52.0)
Hemoglobin: 14.1 g/dL (ref 13.0–17.0)
Immature Granulocytes: 0 %
Lymphocytes Relative: 16 %
Lymphs Abs: 2 10*3/uL (ref 0.7–4.0)
MCH: 32.1 pg (ref 26.0–34.0)
MCHC: 33.1 g/dL (ref 30.0–36.0)
MCV: 97 fL (ref 80.0–100.0)
Monocytes Absolute: 0.9 10*3/uL (ref 0.1–1.0)
Monocytes Relative: 7 %
Neutro Abs: 9.8 10*3/uL — ABNORMAL HIGH (ref 1.7–7.7)
Neutrophils Relative %: 76 %
Platelet Count: 199 10*3/uL (ref 150–400)
RBC: 4.39 MIL/uL (ref 4.22–5.81)
RDW: 11.9 % (ref 11.5–15.5)
WBC Count: 12.9 10*3/uL — ABNORMAL HIGH (ref 4.0–10.5)
nRBC: 0 % (ref 0.0–0.2)

## 2018-09-27 LAB — CMP (CANCER CENTER ONLY)
ALT: 46 U/L — ABNORMAL HIGH (ref 0–44)
AST: 18 U/L (ref 15–41)
Albumin: 3.9 g/dL (ref 3.5–5.0)
Alkaline Phosphatase: 84 U/L (ref 38–126)
Anion gap: 10 (ref 5–15)
BUN: 15 mg/dL (ref 6–20)
CO2: 26 mmol/L (ref 22–32)
Calcium: 10.1 mg/dL (ref 8.9–10.3)
Chloride: 105 mmol/L (ref 98–111)
Creatinine: 1.12 mg/dL (ref 0.61–1.24)
GFR, Est AFR Am: >60 mL/min (ref >60)
GFR, Estimated: >60 mL/min (ref >60)
Glucose, Bld: 145 mg/dL — ABNORMAL HIGH (ref 70–99)
Potassium: 3.5 mmol/L (ref 3.5–5.1)
Sodium: 141 mmol/L (ref 135–145)
Total Bilirubin: 0.6 mg/dL (ref 0.3–1.2)
Total Protein: 6.8 g/dL (ref 6.5–8.1)

## 2018-09-27 LAB — RETICULOCYTES
Immature Retic Fract: 7.4 % (ref 2.3–15.9)
RBC.: 4.52 MIL/uL (ref 4.22–5.81)
Retic Count, Absolute: 45.2 10*3/uL (ref 19.0–186.0)
Retic Ct Pct: 1 % (ref 0.4–3.1)

## 2018-09-27 LAB — LACTATE DEHYDROGENASE: LDH: 151 U/L (ref 98–192)

## 2018-09-27 MED ORDER — PREDNISONE 5 MG PO TABS: 5.0000 mg | ORAL_TABLET | Freq: Every day | ORAL | 1 refills | Status: DC

## 2018-09-27 NOTE — Progress Notes (Signed)
[place] OFFICE PROGRESS NOTE   Diagnosis: TTP  INTERVAL HISTORY:   Eric Hooper returns as scheduled.  He is now taking prednisone at a dose of 10 mg daily.  He reports the blood sugar returned at 104 this morning at 109 yesterday morning. He continues to have discomfort at the anterior chest with lifting and activity.  No dyspnea or other associated symptoms.  He is having bowel movements.  No bleeding. He reports "blurred vision" while taking prednisone.  Objective:  Vital signs in last 24 hours:  Blood pressure (!) 141/91, pulse 85, temperature 98.9 F (37.2 C), temperature source Oral, resp. rate 18, height 5\' 10"  (1.778 m), weight 189 lb 8 oz (86 kg), SpO2 100 %.    HEENT: No thrush Resp: Lungs clear bilaterally Cardio: Regular rate and rhythm GI: No hepatosplenomegaly Vascular: No leg edema Musculoskeletal: Mild tenderness at the bilateral anterior chest wall    Lab Results:  Lab Results  Component Value Date   WBC 12.9 (H) 09/27/2018   HGB 14.1 09/27/2018   HCT 42.6 09/27/2018   MCV 97.0 09/27/2018   PLT 199 09/27/2018   NEUTROABS 9.8 (H) 09/27/2018    CMP  Lab Results  Component Value Date   NA 141 09/27/2018   K 3.5 09/27/2018   CL 105 09/27/2018   CO2 26 09/27/2018   GLUCOSE 145 (H) 09/27/2018   BUN 15 09/27/2018   CREATININE 1.12 09/27/2018   CALCIUM 10.1 09/27/2018   PROT 6.8 09/27/2018   ALBUMIN 3.9 09/27/2018   AST 18 09/27/2018   ALT 46 (H) 09/27/2018   ALKPHOS 84 09/27/2018   BILITOT 0.6 09/27/2018   GFRNONAA >60 09/27/2018   GFRAA >60 09/27/2018     Medications: I have reviewed the patient's current medications.   Assessment/Plan: 1. TTP initially diagnosed in October 2018 with recurrence in April 2020.  Initiation of daily plasma exchange and Solu-Medrol 02/02/2017  [name] Antibody- 72, activity less than 2% on initial presentation; [name] from 08/15/2018 <2.0.  Last plasma exchange 02/06/2017   Prednisone 60 mg daily beginning 02/08/2017  Prednisone 40 mg daily beginning 02/17/2017  Prednisone 30 mg daily beginning 03/09/2017  Prednisone 20 mg daily beginning 03/23/2017  Prednisone 10 mg daily beginning 04/01/2017  Prednisone 5 mg daily for 5 days then 5 mg every other day for 5 doses then stop beginning 05/11/2017  Recurrent TTP 08/14/2018  Plasma exchange/steroids started on 08/15/2018.  Plasma exchange discontinued after treatment on 08/19/2019, discharged on prednisone 60mg  daily  Prednisone taper to 40 mg daily 08/22/2018  Weekly rituximab for 4 doses beginning 08/25/2018  Plasma exchange resumed 08/26/2018, 08/27/2018, 08/28/2018, 08/29/2018, 08/31/2018, 09/02/2018  Prednisone taper to 30 mg daily 09/01/2018  Cycle 3 weekly Rituxan 09/08/2018  Prednisone taper to 20 mg daily 09/08/2018  Cycle 4 weekly Rituxan 09/15/2018  Prednisone taper to 10 mg daily 09/20/2018  Prednisone taper to 5 mg daily 09/27/2018 2. Xiphoid pain-etiology unclear 3. Hypertension 4. Diabetes 5. Alcohol/Tobacco use 6.History of renal insufficiency 7. Right IJ dialysis catheter placed 02/02/2017, removed  New right IJ dialysis catheter 08/15/2018  Catheter removed 09/12/2018   Disposition: Eric Hooper is in clinical remission from TTP.  We taper the prednisone to 5 mg daily.  The anterior chest discomfort is most likely to a benign musculoskeletal condition.  He does not feel ready to return to work secondary to the need for heavy lifting at work and blurred vision while on prednisone.  Hopefully the vision changes will improve  with the prednisone taper.  He will be scheduled to return to work on 10/10/2018.  He will let us know if symptoms persist at that time.  He will return for lab visit on 10/06/2018 and an office visit on 10/17/2018.  [name], MD  09/27/2018  2:57 PM

## 2018-09-28 ENCOUNTER — Telehealth: Payer: Self-pay | Admitting: Oncology

## 2018-09-28 ENCOUNTER — Other Ambulatory Visit: Payer: Self-pay | Admitting: Family Medicine

## 2018-09-28 DIAGNOSIS — E1165 Type 2 diabetes mellitus with hyperglycemia: Secondary | ICD-10-CM

## 2018-09-28 NOTE — Telephone Encounter (Signed)
Scheduled appt per 6/2 los.  Left a voice message of appt date and time.

## 2018-10-01 ENCOUNTER — Other Ambulatory Visit: Payer: Self-pay | Admitting: Oncology

## 2018-10-03 ENCOUNTER — Other Ambulatory Visit: Payer: Self-pay | Admitting: Oncology

## 2018-10-03 DIAGNOSIS — M3119 Other thrombotic microangiopathy: Secondary | ICD-10-CM

## 2018-10-03 DIAGNOSIS — M311 Thrombotic microangiopathy: Secondary | ICD-10-CM

## 2018-10-04 ENCOUNTER — Other Ambulatory Visit: Payer: Self-pay | Admitting: *Deleted

## 2018-10-06 ENCOUNTER — Inpatient Hospital Stay: Payer: Commercial Managed Care - PPO

## 2018-10-06 ENCOUNTER — Other Ambulatory Visit: Payer: Self-pay

## 2018-10-06 DIAGNOSIS — M311 Thrombotic microangiopathy: Secondary | ICD-10-CM | POA: Diagnosis not present

## 2018-10-06 DIAGNOSIS — M3119 Other thrombotic microangiopathy: Secondary | ICD-10-CM

## 2018-10-06 LAB — CBC WITH DIFFERENTIAL (CANCER CENTER ONLY)
Abs Immature Granulocytes: 0.03 10*3/uL (ref 0.00–0.07)
Basophils Absolute: 0 10*3/uL (ref 0.0–0.1)
Basophils Relative: 0 %
Eosinophils Absolute: 0.2 10*3/uL (ref 0.0–0.5)
Eosinophils Relative: 2 %
HCT: 43.8 % (ref 39.0–52.0)
Hemoglobin: 14.7 g/dL (ref 13.0–17.0)
Immature Granulocytes: 0 %
Lymphocytes Relative: 28 %
Lymphs Abs: 2.9 10*3/uL (ref 0.7–4.0)
MCH: 32 pg (ref 26.0–34.0)
MCHC: 33.6 g/dL (ref 30.0–36.0)
MCV: 95.4 fL (ref 80.0–100.0)
Monocytes Absolute: 0.7 10*3/uL (ref 0.1–1.0)
Monocytes Relative: 7 %
Neutro Abs: 6.5 10*3/uL (ref 1.7–7.7)
Neutrophils Relative %: 63 %
Platelet Count: 224 10*3/uL (ref 150–400)
RBC: 4.59 MIL/uL (ref 4.22–5.81)
RDW: 12.1 % (ref 11.5–15.5)
WBC Count: 10.3 10*3/uL (ref 4.0–10.5)
nRBC: 0 % (ref 0.0–0.2)

## 2018-10-06 LAB — RETICULOCYTES
Immature Retic Fract: 9.7 % (ref 2.3–15.9)
RBC.: 4.56 MIL/uL (ref 4.22–5.81)
Retic Count, Absolute: 45.1 10*3/uL (ref 19.0–186.0)
Retic Ct Pct: 1 % (ref 0.4–3.1)

## 2018-10-06 LAB — CMP (CANCER CENTER ONLY)
ALT: 58 U/L — ABNORMAL HIGH (ref 0–44)
AST: 19 U/L (ref 15–41)
Albumin: 4.3 g/dL (ref 3.5–5.0)
Alkaline Phosphatase: 84 U/L (ref 38–126)
Anion gap: 11 (ref 5–15)
BUN: 11 mg/dL (ref 6–20)
CO2: 26 mmol/L (ref 22–32)
Calcium: 10.2 mg/dL (ref 8.9–10.3)
Chloride: 107 mmol/L (ref 98–111)
Creatinine: 1.02 mg/dL (ref 0.61–1.24)
GFR, Est AFR Am: >60 mL/min (ref >60)
GFR, Estimated: >60 mL/min (ref >60)
Glucose, Bld: 97 mg/dL (ref 70–99)
Potassium: 3.9 mmol/L (ref 3.5–5.1)
Sodium: 144 mmol/L (ref 135–145)
Total Bilirubin: 0.5 mg/dL (ref 0.3–1.2)
Total Protein: 7 g/dL (ref 6.5–8.1)

## 2018-10-06 LAB — LACTATE DEHYDROGENASE: LDH: 163 U/L (ref 98–192)

## 2018-10-07 ENCOUNTER — Encounter: Payer: Self-pay | Admitting: Nurse Practitioner

## 2018-10-07 ENCOUNTER — Telehealth: Payer: Self-pay | Admitting: *Deleted

## 2018-10-07 NOTE — Telephone Encounter (Signed)
-----   Message from Ladell Pier, MD sent at 10/06/2018  5:06 PM EDT ----- Please call patient, platelets and hemoglobin are normal, discontinue prednisone, follow-up as scheduled

## 2018-10-07 NOTE — Telephone Encounter (Signed)
Called with normal lab results and he may d/c prednisone at this time. Follow up on 10/17/18 as scheduled. Patient requesting MD to dictate a letter to defer his return to work to 10/17/18. Still having significant blurred vision from the prednisone. He will pick up letter when he comes in on 6/22.

## 2018-10-11 LAB — ADAMTS13 ACTIVITY: Adamts 13 Activity: <2 % — CL (ref >66.8)

## 2018-10-11 LAB — ADAMTS13 ANTIBODY: ADAMTS13 Antibody: 60 Units/mL — ABNORMAL HIGH (ref <12)

## 2018-10-13 ENCOUNTER — Other Ambulatory Visit: Payer: Self-pay | Admitting: Family Medicine

## 2018-10-13 DIAGNOSIS — E1165 Type 2 diabetes mellitus with hyperglycemia: Secondary | ICD-10-CM

## 2018-10-13 MED ORDER — METFORMIN HCL 1000 MG PO TABS: 1000.0000 mg | ORAL_TABLET | Freq: Two times a day (BID) | ORAL | 3 refills | Status: DC

## 2018-10-13 NOTE — Telephone Encounter (Signed)
Pt needs refill for metformin

## 2018-10-13 NOTE — Telephone Encounter (Signed)
Will forward to MD to advise. Jazmin Hartsell,CMA  

## 2018-10-17 ENCOUNTER — Ambulatory Visit (INDEPENDENT_AMBULATORY_CARE_PROVIDER_SITE_OTHER): Payer: Commercial Managed Care - PPO | Admitting: Family Medicine

## 2018-10-17 ENCOUNTER — Other Ambulatory Visit: Payer: Self-pay

## 2018-10-17 ENCOUNTER — Inpatient Hospital Stay: Payer: Commercial Managed Care - PPO

## 2018-10-17 ENCOUNTER — Encounter: Payer: Self-pay | Admitting: Nurse Practitioner

## 2018-10-17 ENCOUNTER — Inpatient Hospital Stay (HOSPITAL_BASED_OUTPATIENT_CLINIC_OR_DEPARTMENT_OTHER): Payer: Commercial Managed Care - PPO | Admitting: Nurse Practitioner

## 2018-10-17 VITALS — BP 134/91 | HR 86 | Temp 98.7°F | Resp 18 | Ht 70.0 in | Wt 192.7 lb

## 2018-10-17 DIAGNOSIS — I10 Essential (primary) hypertension: Secondary | ICD-10-CM | POA: Diagnosis not present

## 2018-10-17 DIAGNOSIS — M3119 Other thrombotic microangiopathy: Secondary | ICD-10-CM

## 2018-10-17 DIAGNOSIS — E1165 Type 2 diabetes mellitus with hyperglycemia: Secondary | ICD-10-CM

## 2018-10-17 DIAGNOSIS — E119 Type 2 diabetes mellitus without complications: Secondary | ICD-10-CM

## 2018-10-17 DIAGNOSIS — M311 Thrombotic microangiopathy: Secondary | ICD-10-CM | POA: Diagnosis not present

## 2018-10-17 LAB — CBC WITH DIFFERENTIAL (CANCER CENTER ONLY)
Abs Immature Granulocytes: 0.01 10*3/uL (ref 0.00–0.07)
Basophils Absolute: 0 10*3/uL (ref 0.0–0.1)
Basophils Relative: 0 %
Eosinophils Absolute: 0.3 10*3/uL (ref 0.0–0.5)
Eosinophils Relative: 3 %
HCT: 42.4 % (ref 39.0–52.0)
Hemoglobin: 14.1 g/dL (ref 13.0–17.0)
Immature Granulocytes: 0 %
Lymphocytes Relative: 45 %
Lymphs Abs: 3.6 10*3/uL (ref 0.7–4.0)
MCH: 31.4 pg (ref 26.0–34.0)
MCHC: 33.3 g/dL (ref 30.0–36.0)
MCV: 94.4 fL (ref 80.0–100.0)
Monocytes Absolute: 0.7 10*3/uL (ref 0.1–1.0)
Monocytes Relative: 9 %
Neutro Abs: 3.5 10*3/uL (ref 1.7–7.7)
Neutrophils Relative %: 43 %
Platelet Count: 197 10*3/uL (ref 150–400)
RBC: 4.49 MIL/uL (ref 4.22–5.81)
RDW: 12.3 % (ref 11.5–15.5)
WBC Count: 8.1 10*3/uL (ref 4.0–10.5)
nRBC: 0 % (ref 0.0–0.2)

## 2018-10-17 LAB — CMP (CANCER CENTER ONLY)
ALT: 30 U/L (ref 0–44)
AST: 16 U/L (ref 15–41)
Albumin: 4 g/dL (ref 3.5–5.0)
Alkaline Phosphatase: 86 U/L (ref 38–126)
Anion gap: 10 (ref 5–15)
BUN: 9 mg/dL (ref 6–20)
CO2: 25 mmol/L (ref 22–32)
Calcium: 9.3 mg/dL (ref 8.9–10.3)
Chloride: 108 mmol/L (ref 98–111)
Creatinine: 0.99 mg/dL (ref 0.61–1.24)
GFR, Est AFR Am: >60 mL/min (ref >60)
GFR, Estimated: >60 mL/min (ref >60)
Glucose, Bld: 145 mg/dL — ABNORMAL HIGH (ref 70–99)
Potassium: 3.3 mmol/L — ABNORMAL LOW (ref 3.5–5.1)
Sodium: 143 mmol/L (ref 135–145)
Total Bilirubin: 0.5 mg/dL (ref 0.3–1.2)
Total Protein: 6.9 g/dL (ref 6.5–8.1)

## 2018-10-17 LAB — LACTATE DEHYDROGENASE: LDH: 138 U/L (ref 98–192)

## 2018-10-17 NOTE — Progress Notes (Signed)
  [place] OFFICE PROGRESS NOTE   Diagnosis: TTP  INTERVAL HISTORY:   Eric Hooper returns as scheduled.  He completed the prednisone taper on 10/07/2018.  He overall feels well.  No fevers or sweats.  No cough or shortness of breath.  He denies bleeding.  No unusual headaches.  No visual disturbance.  Blurry vision has resolved.  No focal extremity weakness.  He reports a good appetite.  Good control of blood sugars.  Objective:  Vital signs in last 24 hours:  Blood pressure (!) 134/91, pulse 86, temperature 98.7 F (37.1 C), temperature source Oral, resp. rate 18, height 5\' 10"  (1.778 m), weight 192 lb 11.2 oz (87.4 kg), SpO2 100 %.    HEENT: Mild white coating over tongue.  No buccal thrush. Lymphatics: No palpable cervical or supraclavicular lymph nodes. GI: Abdomen soft and nontender.  No hepatosplenomegaly. Vascular: No leg edema.    Lab Results:  Lab Results  Component Value Date   WBC 8.1 10/17/2018   HGB 14.1 10/17/2018   HCT 42.4 10/17/2018   MCV 94.4 10/17/2018   PLT 197 10/17/2018   NEUTROABS 3.5 10/17/2018    Imaging:  No results found.  Medications: I have reviewed the patient's current medications.  Assessment/Plan: 1.TTP initially diagnosed in October 2018 with recurrence in April 2020.  Initiation of daily plasma exchange and Solu-Medrol 02/02/2017  [name] Antibody- 72, activity less than 2% on initial presentation; [name] from 08/15/2018 <2.0.  Last plasma exchange 02/06/2017  Prednisone 60 mg daily beginning 02/08/2017  Prednisone 40 mg daily beginning 02/17/2017  Prednisone 30 mg daily beginning 03/09/2017  Prednisone 20 mg daily beginning 03/23/2017  Prednisone 10 mg daily beginning 04/01/2017  Prednisone 5 mg daily for 5 days then 5 mg every other day for 5 doses then stop beginning 05/11/2017  Recurrent TTP 08/14/2018  Plasma exchange/steroids started on 08/15/2018. Plasma exchange discontinued after treatment on  08/19/2019, discharged on prednisone 60mg  daily  Prednisone taper to 40 mg daily 08/22/2018  Weekly rituximab for 4 doses beginning 08/25/2018  Plasma exchange resumed 08/26/2018, 08/27/2018, 08/28/2018, 08/29/2018, 08/31/2018, 09/02/2018  Prednisone taper to 30 mg daily 09/01/2018  Cycle 3 weekly Rituxan 09/08/2018  Prednisone taper to 20 mg daily 09/08/2018  Cycle 4 weekly Rituxan 09/15/2018  Prednisone taper to 10 mg daily 09/20/2018  Prednisone taper to 5 mg daily 09/27/2018  Prednisone discontinued 10/07/2018 2. Xiphoid pain-etiology unclear 3. Hypertension 4. Diabetes 5. Alcohol/Tobacco use 6.History of renal insufficiency 7. Right IJ dialysis catheter placed 02/02/2017, removed  New right IJ dialysis catheter 08/15/2018  Catheter removed 09/12/2018   Disposition: Eric Hooper remains in clinical remission from TTP.  He has been tapered off of prednisone.  The vision changes resolved coinciding with discontinuation of prednisone.  He will return for a lab appointment in 2 weeks.  He will return for lab and follow-up in 4 weeks.  He will contact the office in the interim with any problems.  Plan reviewed with Dr. Benay Spice.    Ned Card ANP/GNP-BC   10/17/2018  10:02 AM

## 2018-10-17 NOTE — Patient Instructions (Addendum)
-   Limit starchy foods to TWO per meal and ONE per snack. ONE portion of a starchy food is equal to the following:   - ONE slice of bread (or its equivalent, such as half of a hamburger bun).   - 1/2 cup of a "scoopable" starchy food such as potatoes or rice.   - 15 grams of Total Carbohydrate as shown on food label.  - Include at every dinner: a protein food, a starchy food, and vegetables.   - Obtain twice the volume of veg's as protein or carbohydrate foods.   - Fresh or frozen veg's are best.   - Keep frozen veg's on hand for a quick vegetable serving.      - Keep in mind frozen vegetables for a quick, easy vegetable serving (they can be microwaved).   - Follow-up nutrition appt via MyChart on July 20 at 4 PM.

## 2018-10-17 NOTE — Progress Notes (Signed)
Telehealth Encounter I connected with Toris Laverdiere (MRN [id number]) on 10/17/2018 by MyChart video-enabled, HIPAA-compliant telemedicine application (but ultimately switched to telephone).  I verified that I am speaking with the correct person using two identifiers, and that the patient was in a private environment conducive to confidentiality.  I discussed the limitations of evaluation and management by telemedicine. The patient expressed understanding and agreed to proceed.  Provider was Kennith Center, PhD, RD, LDN, CEDRD Provider(s) located at home during this telehealth encounter; patient was at home.  Appt start time: 1500 end time: 1600 (1 hour)  Reason for telehealth visit: Follow-up of Medical Nutrition Therapy for poorly controlled DM (E11.65) per PCP Matilde Haymaker, MD.   Relevant history/background: Recently poor    Assessment:  FBG: Have been running 94-140; most high-90's to low-100s.   Recent eating pattern: 2 meals and 0-2 snack per day.  Starts back to work tomorrow; will be working from [employer] AM to 3:30 PM.   Recent physical activity: none. He has been eating more vegetables than previously.  It was clear from today's conversation that Mr. Lemke has been limiting carbohydrate pretty severely; did not remember recommendation of getting 2 carb portions per meal, nor what = 1 portion.   24-hr recall:  (Up at 7 AM; FBG was 107) B (9:30 AM)-  1 egg, 2 slc bacon, 1 apple, water Snk ( AM)-  water L (1 PM)-  1 apple, 1 plum Snk ( PM)-   D (5 PM)-  5-6 oz steak, 1/2 c mashed potatoes, 1-2 tbsp gravy, water Snk (6 PM)-  2 plums Typical day? Yes.  except that he usually has a vegetable at dinner.    Intervention: Reviewed recent diet history, and clarified dietary goals so he better understands appropriate carb intake.    For Goals and Recommendations, see Patient Instructions.   Follow-up:  Telehealth visit in 4 weeks, Monday, July 20 at 4 PM.   Iver Nestle

## 2018-10-18 ENCOUNTER — Telehealth: Payer: Commercial Managed Care - PPO | Admitting: Family Medicine

## 2018-10-18 ENCOUNTER — Telehealth: Payer: Self-pay | Admitting: Nurse Practitioner

## 2018-10-18 NOTE — Telephone Encounter (Signed)
Called and spoke with patient. Confirmed date and times

## 2018-10-27 ENCOUNTER — Telehealth: Payer: Self-pay | Admitting: Oncology

## 2018-10-27 NOTE — Telephone Encounter (Signed)
Per patient moved 7/7 lab to 7/9. Confirmed with patient.

## 2018-10-31 ENCOUNTER — Other Ambulatory Visit: Payer: Commercial Managed Care - PPO

## 2018-11-03 ENCOUNTER — Inpatient Hospital Stay: Payer: Commercial Managed Care - PPO | Attending: Oncology

## 2018-11-03 DIAGNOSIS — M311 Thrombotic microangiopathy: Secondary | ICD-10-CM | POA: Insufficient documentation

## 2018-11-03 DIAGNOSIS — E119 Type 2 diabetes mellitus without complications: Secondary | ICD-10-CM | POA: Insufficient documentation

## 2018-11-03 DIAGNOSIS — I10 Essential (primary) hypertension: Secondary | ICD-10-CM | POA: Insufficient documentation

## 2018-11-07 ENCOUNTER — Other Ambulatory Visit: Payer: Self-pay | Admitting: Family Medicine

## 2018-11-07 DIAGNOSIS — I10 Essential (primary) hypertension: Secondary | ICD-10-CM

## 2018-11-09 ENCOUNTER — Inpatient Hospital Stay: Payer: Commercial Managed Care - PPO

## 2018-11-09 ENCOUNTER — Other Ambulatory Visit: Payer: Self-pay

## 2018-11-09 DIAGNOSIS — M311 Thrombotic microangiopathy: Secondary | ICD-10-CM | POA: Diagnosis not present

## 2018-11-09 DIAGNOSIS — M3119 Other thrombotic microangiopathy: Secondary | ICD-10-CM

## 2018-11-09 DIAGNOSIS — I10 Essential (primary) hypertension: Secondary | ICD-10-CM | POA: Diagnosis not present

## 2018-11-09 DIAGNOSIS — E119 Type 2 diabetes mellitus without complications: Secondary | ICD-10-CM | POA: Diagnosis not present

## 2018-11-09 LAB — CMP (CANCER CENTER ONLY)
ALT: 35 U/L (ref 0–44)
AST: 19 U/L (ref 15–41)
Albumin: 4.2 g/dL (ref 3.5–5.0)
Alkaline Phosphatase: 103 U/L (ref 38–126)
Anion gap: 11 (ref 5–15)
BUN: 10 mg/dL (ref 6–20)
CO2: 26 mmol/L (ref 22–32)
Calcium: 8.7 mg/dL — ABNORMAL LOW (ref 8.9–10.3)
Chloride: 105 mmol/L (ref 98–111)
Creatinine: 0.94 mg/dL (ref 0.61–1.24)
GFR, Est AFR Am: >60 mL/min (ref >60)
GFR, Estimated: >60 mL/min (ref >60)
Glucose, Bld: 223 mg/dL — ABNORMAL HIGH (ref 70–99)
Potassium: 3.2 mmol/L — ABNORMAL LOW (ref 3.5–5.1)
Sodium: 142 mmol/L (ref 135–145)
Total Bilirubin: 1 mg/dL (ref 0.3–1.2)
Total Protein: 6.9 g/dL (ref 6.5–8.1)

## 2018-11-09 LAB — CBC WITH DIFFERENTIAL (CANCER CENTER ONLY)
Abs Immature Granulocytes: 0.02 10*3/uL (ref 0.00–0.07)
Basophils Absolute: 0 10*3/uL (ref 0.0–0.1)
Basophils Relative: 1 %
Eosinophils Absolute: 0.3 10*3/uL (ref 0.0–0.5)
Eosinophils Relative: 3 %
HCT: 45 % (ref 39.0–52.0)
Hemoglobin: 15.5 g/dL (ref 13.0–17.0)
Immature Granulocytes: 0 %
Lymphocytes Relative: 45 %
Lymphs Abs: 3.9 10*3/uL (ref 0.7–4.0)
MCH: 30.5 pg (ref 26.0–34.0)
MCHC: 34.4 g/dL (ref 30.0–36.0)
MCV: 88.4 fL (ref 80.0–100.0)
Monocytes Absolute: 0.6 10*3/uL (ref 0.1–1.0)
Monocytes Relative: 7 %
Neutro Abs: 3.8 10*3/uL (ref 1.7–7.7)
Neutrophils Relative %: 44 %
Platelet Count: 231 10*3/uL (ref 150–400)
RBC: 5.09 MIL/uL (ref 4.22–5.81)
RDW: 12.8 % (ref 11.5–15.5)
WBC Count: 8.7 10*3/uL (ref 4.0–10.5)
nRBC: 0 % (ref 0.0–0.2)

## 2018-11-09 LAB — LACTATE DEHYDROGENASE: LDH: 145 U/L (ref 98–192)

## 2018-11-11 ENCOUNTER — Other Ambulatory Visit: Payer: Self-pay

## 2018-11-11 ENCOUNTER — Telehealth: Payer: Self-pay

## 2018-11-11 MED ORDER — POTASSIUM CHLORIDE CRYS ER 20 MEQ PO TBCR: 20.0000 meq | EXTENDED_RELEASE_TABLET | Freq: Every day | ORAL | 0 refills | Status: DC

## 2018-11-11 NOTE — Telephone Encounter (Signed)
Spoke with pt in regards to msg below. Per Eric Card NP :  Please contact him-is he taking potassium? If so, what is the dose/schedule? If not, please send a prescription to his pharmacy for K-Dur 20 milliequivalents daily.   Pt states that he is not on Potassium. Advised pt that a RX for Potassium 1meq daily was sent to the pharmacy. Advised pt to take 1 tablet daily. Pt verbalized understanding.

## 2018-11-11 NOTE — Telephone Encounter (Signed)
-----   Message from Owens Shark, NP sent at 11/11/2018  8:59 AM EDT ----- Please contact him-is he taking potassium?  If so, what is the dose/schedule?  If not, please send a prescription to his pharmacy for K-Dur 20 milliequivalents daily.

## 2018-11-14 ENCOUNTER — Encounter: Payer: Commercial Managed Care - PPO | Admitting: Family Medicine

## 2018-11-14 ENCOUNTER — Other Ambulatory Visit: Payer: Self-pay

## 2018-11-14 NOTE — Progress Notes (Signed)
This encounter was created in error - please disregard.

## 2018-11-17 ENCOUNTER — Other Ambulatory Visit: Payer: Commercial Managed Care - PPO

## 2018-11-17 ENCOUNTER — Ambulatory Visit: Payer: Commercial Managed Care - PPO | Admitting: Oncology

## 2018-11-21 ENCOUNTER — Inpatient Hospital Stay: Payer: Commercial Managed Care - PPO

## 2018-11-21 ENCOUNTER — Other Ambulatory Visit: Payer: Self-pay

## 2018-11-21 ENCOUNTER — Inpatient Hospital Stay (HOSPITAL_BASED_OUTPATIENT_CLINIC_OR_DEPARTMENT_OTHER): Payer: Commercial Managed Care - PPO | Admitting: Oncology

## 2018-11-21 VITALS — BP 138/82 | HR 85 | Temp 98.7°F | Resp 18 | Ht 70.0 in | Wt 193.9 lb

## 2018-11-21 DIAGNOSIS — M3119 Other thrombotic microangiopathy: Secondary | ICD-10-CM

## 2018-11-21 DIAGNOSIS — M311 Thrombotic microangiopathy: Secondary | ICD-10-CM

## 2018-11-21 DIAGNOSIS — I10 Essential (primary) hypertension: Secondary | ICD-10-CM | POA: Diagnosis not present

## 2018-11-21 DIAGNOSIS — E119 Type 2 diabetes mellitus without complications: Secondary | ICD-10-CM

## 2018-11-21 DIAGNOSIS — R4702 Dysphasia: Secondary | ICD-10-CM | POA: Diagnosis not present

## 2018-11-21 LAB — CBC WITH DIFFERENTIAL (CANCER CENTER ONLY)
Abs Immature Granulocytes: 0.02 10*3/uL (ref 0.00–0.07)
Basophils Absolute: 0.1 10*3/uL (ref 0.0–0.1)
Basophils Relative: 1 %
Eosinophils Absolute: 0.3 10*3/uL (ref 0.0–0.5)
Eosinophils Relative: 4 %
HCT: 45.6 % (ref 39.0–52.0)
Hemoglobin: 15.6 g/dL (ref 13.0–17.0)
Immature Granulocytes: 0 %
Lymphocytes Relative: 45 %
Lymphs Abs: 4 10*3/uL (ref 0.7–4.0)
MCH: 30.2 pg (ref 26.0–34.0)
MCHC: 34.2 g/dL (ref 30.0–36.0)
MCV: 88.4 fL (ref 80.0–100.0)
Monocytes Absolute: 0.7 10*3/uL (ref 0.1–1.0)
Monocytes Relative: 8 %
Neutro Abs: 3.6 10*3/uL (ref 1.7–7.7)
Neutrophils Relative %: 42 %
Platelet Count: 217 10*3/uL (ref 150–400)
RBC: 5.16 MIL/uL (ref 4.22–5.81)
RDW: 13.2 % (ref 11.5–15.5)
WBC Count: 8.7 10*3/uL (ref 4.0–10.5)
nRBC: 0 % (ref 0.0–0.2)

## 2018-11-21 LAB — CMP (CANCER CENTER ONLY)
ALT: 35 U/L (ref 0–44)
AST: 21 U/L (ref 15–41)
Albumin: 4.5 g/dL (ref 3.5–5.0)
Alkaline Phosphatase: 97 U/L (ref 38–126)
Anion gap: 10 (ref 5–15)
BUN: 10 mg/dL (ref 6–20)
CO2: 28 mmol/L (ref 22–32)
Calcium: 9.6 mg/dL (ref 8.9–10.3)
Chloride: 103 mmol/L (ref 98–111)
Creatinine: 0.86 mg/dL (ref 0.61–1.24)
GFR, Est AFR Am: >60 mL/min (ref >60)
GFR, Estimated: >60 mL/min (ref >60)
Glucose, Bld: 98 mg/dL (ref 70–99)
Potassium: 3.8 mmol/L (ref 3.5–5.1)
Sodium: 141 mmol/L (ref 135–145)
Total Bilirubin: 0.7 mg/dL (ref 0.3–1.2)
Total Protein: 7.3 g/dL (ref 6.5–8.1)

## 2018-11-21 LAB — LACTATE DEHYDROGENASE: LDH: 148 U/L (ref 98–192)

## 2018-11-21 NOTE — Progress Notes (Signed)
  [facility] OFFICE PROGRESS NOTE   Diagnosis: TTP  INTERVAL HISTORY:   [name] returns as scheduled.  He reports feeling well.  He reports his blood sugar is under good control.  No bleeding.  He has occasional dysphasia, relieved with Nexium. Objective:  Vital signs in last 24 hours:  Blood pressure 138/82, pulse 85, temperature 98.7 F (37.1 C), temperature source Oral, resp. rate 18, height 5\' 10"  (1.778 m), weight 193 lb 14.4 oz (88 kg), SpO2 99 %.   Physical examination-not performed today Lab Results:  Lab Results  Component Value Date   WBC 8.7 11/21/2018   HGB 15.6 11/21/2018   HCT 45.6 11/21/2018   MCV 88.4 11/21/2018   PLT 217 11/21/2018   NEUTROABS 3.6 11/21/2018    CMP  Lab Results  Component Value Date   NA 141 11/21/2018   K 3.8 11/21/2018   CL 103 11/21/2018   CO2 28 11/21/2018   GLUCOSE 98 11/21/2018   BUN 10 11/21/2018   CREATININE 0.86 11/21/2018   CALCIUM 9.6 11/21/2018   PROT 7.3 11/21/2018   ALBUMIN 4.5 11/21/2018   AST 21 11/21/2018   ALT 35 11/21/2018   ALKPHOS 97 11/21/2018   BILITOT 0.7 11/21/2018   GFRNONAA >60 11/21/2018   GFRAA >60 11/21/2018   LDH 148 Medications: I have reviewed the patient's current medications.   Assessment/Plan: 1. TTP initially diagnosed in October 2018 with recurrence in April 2020.  Initiation of daily plasma exchange and Solu-Medrol 02/02/2017  [name] Antibody- 72, activity less than 2% on initial presentation; [name] from 08/15/2018 <2.0.  Last plasma exchange 02/06/2017  Prednisone 60 mg daily beginning 02/08/2017  Prednisone 40 mg daily beginning 02/17/2017  Prednisone 30 mg daily beginning 03/09/2017  Prednisone 20 mg daily beginning 03/23/2017  Prednisone 10 mg daily beginning 04/01/2017  Prednisone 5 mg daily for 5 days then 5 mg every other day for 5 doses then stop beginning 05/11/2017  Recurrent TTP 08/14/2018  Plasma exchange/steroids started on 08/15/2018.  Plasma exchange discontinued after treatment on 08/19/2019, discharged on prednisone 60mg  daily  Prednisone taper to 40 mg daily 08/22/2018  Weekly rituximab for 4 doses beginning 08/25/2018  Plasma exchange resumed 08/26/2018, 08/27/2018, 08/28/2018, 08/29/2018, 08/31/2018, 09/02/2018  Prednisone taper to 30 mg daily 09/01/2018  Cycle 3 weekly Rituxan 09/08/2018  Prednisone taper to 20 mg daily 09/08/2018  Cycle 4 weekly Rituxan 09/15/2018  Prednisone taper to 10 mg daily 09/20/2018  Prednisone taper to 5 mg daily 09/27/2018  Prednisone discontinued 10/07/2018 2. Xiphoid pain-etiology unclear 3. Hypertension 4. Diabetes 5. Alcohol/Tobacco use 6.History of renal insufficiency 7. Right IJ dialysis catheter placed 02/02/2017, removed  New right IJ dialysis catheter 08/15/2018  Catheter removed 09/12/2018    Disposition: [name] is in clinical remission from TTP.  The Adamts 13 activity remains low when he was here on 10/06/2018.  We will follow-up on the activity level today.  [name] will return for an office and lab visit in 3 months.  He will contact us in the interim for new symptoms.  Eric Coder, MD  11/21/2018  4:31 PM

## 2018-11-22 ENCOUNTER — Telehealth: Payer: Self-pay | Admitting: Oncology

## 2018-11-22 NOTE — Telephone Encounter (Signed)
Called and spoke with patient. Confirmed date and time of appt  ° °

## 2018-11-24 ENCOUNTER — Other Ambulatory Visit: Payer: Self-pay

## 2018-11-24 ENCOUNTER — Telehealth (INDEPENDENT_AMBULATORY_CARE_PROVIDER_SITE_OTHER): Payer: Commercial Managed Care - PPO | Admitting: Family Medicine

## 2018-11-24 DIAGNOSIS — R079 Chest pain, unspecified: Secondary | ICD-10-CM | POA: Diagnosis not present

## 2018-11-24 NOTE — Progress Notes (Addendum)
[facility] Telemedicine Visit  Patient consented to have virtual visit. Method of visit: Telephone  Encounter participants: Patient: Eric Hooper - located at home Provider: Bonnita Hollow - located at office Others (if applicable): Fiance   Chief Complaint: Chest pain  HPI:  Eric Hooper is a 43 y.o. male who presents with one month of intermittent chest pain.  It is associated with an occasional dry cough.  It is exertional.  It has not been worse over the past 2 days. Two days ago, patient was outside mowing the grass when he felt the pain come on again. It was in his chest and radiated to his neck.  The pain is now not going away.  Patient has not tried anything for it due to concern that medications he would take would interact with his platelets because of his history of TTP.  Chest pain is not associated with shortness of breath, fevers, chills, congestion, sore throat.  Patient had normal CBC on 7/27.  Patient has no family history of cardiac disease.  ROS: per HPI  Pertinent PMHx: TTP, hypertension, diabetes  Exam:  Respiratory: Able to speak in full sentences without issue, no cough on phone  Assessment/Plan: Intermittent chest pain Chest pain. It is exertional. Does have cardiac risk factors as male, HTN, DM. Cannot rule out cardiac disease.  Other possibilities include MSK pain.  Low suspicion that patient is infectious.  Recommend patient be seen in the office for physical exam possible further work-up including EKG, chest x-ray.  Return precautions were given to patient if he has worsening symptoms to go to the ED.  Time spent during visit with patient: 11 minutes

## 2018-11-25 ENCOUNTER — Ambulatory Visit (INDEPENDENT_AMBULATORY_CARE_PROVIDER_SITE_OTHER): Payer: Commercial Managed Care - PPO | Admitting: Family Medicine

## 2018-11-25 ENCOUNTER — Other Ambulatory Visit: Payer: Self-pay | Admitting: Family Medicine

## 2018-11-25 ENCOUNTER — Telehealth: Payer: Self-pay | Admitting: Family Medicine

## 2018-11-25 ENCOUNTER — Other Ambulatory Visit: Payer: Self-pay

## 2018-11-25 ENCOUNTER — Ambulatory Visit (HOSPITAL_COMMUNITY)
Admission: RE | Admit: 2018-11-25 | Discharge: 2018-11-25 | Disposition: A | Discharge status: Home / Self Care | Payer: Commercial Managed Care - PPO | Source: Ambulatory Visit | Attending: Family Medicine | Admitting: Family Medicine

## 2018-11-25 VITALS — BP 124/80 | Ht 70.0 in | Wt 190.0 lb

## 2018-11-25 DIAGNOSIS — R0789 Other chest pain: Secondary | ICD-10-CM

## 2018-11-25 DIAGNOSIS — R079 Chest pain, unspecified: Secondary | ICD-10-CM | POA: Diagnosis not present

## 2018-11-25 LAB — ADAMTS13 ANTIBODY: ADAMTS13 Antibody: 45 Units/mL — ABNORMAL HIGH (ref <12)

## 2018-11-25 LAB — ADAMTS13 ACTIVITY: Adamts 13 Activity: 2.2 % — CL (ref >66.8)

## 2018-11-25 MED ORDER — NITROGLYCERIN 0.3 MG SL SUBL: 0.3000 mg | SUBLINGUAL_TABLET | SUBLINGUAL | 0 refills | Status: DC | PRN

## 2018-11-25 MED ORDER — CETIRIZINE HCL 10 MG PO TABS: 10.0000 mg | ORAL_TABLET | Freq: Every day | ORAL | 11 refills | Status: DC

## 2018-11-25 NOTE — Telephone Encounter (Signed)
Patient mother calling about 2 things. First, she called cardiologist to make him an appt and they said they could not make him an appt because there were no notes in the patients chart. Second, patient was supposed to get refill on his allergy medicine and he didn't get that. Please call patient when this has been done.

## 2018-11-25 NOTE — Telephone Encounter (Signed)
Patient's note for today's visit is being done by Dr. Enid Derry. She is the one who referred patient to cardiology. This will likely be done soon. I will refill patient's allergy medication.

## 2018-11-25 NOTE — Telephone Encounter (Signed)
Will forward to MD.  Allergy medication was sent to pharmacy.  Nuria Phebus,CMA

## 2018-11-25 NOTE — Progress Notes (Signed)
Subjective:  [name]  DOB: January 02, 1976 MRN: [id number]  Eric Hooper is a 43 y.o. male with a PMH of TTP, T2 DM, GERD, hypertension, here today for chest pain.   HPI:  Chest pain: -Patient had telemedicine visit yesterday and doctor recommended that patient be seen in office for follow-up given heart score of 3. -Patient reports that he has this pain in the right side of his chest that is worse when he moves.  States that it radiates to his back but can also go to his shoulder.  He states that he has been having on and off epigastric pain which is different than this. -Laying down improves his pain -Walking around sometimes makes it worse but does not always.  The pain will sometimes move across to the left side of his chest.  It is not worse when he touches it.  Patient reports that it feels more like a dull ache and a sharp pain.  States that this is been going on and off for about 2 months. -Patient states that he was able to cut his grass over the weekend without any exacerbation of his pain. -Denies any shortness of breath, leg swelling, nausea, vomiting, diarrhea, constipation, fever, chills, cough -Patient has followed up with cardiology before after his TTP exacerbation and they attributed this pain to being atypical and likely due to his TTP and prednisone use.  They did not recommend any further work-up at that time.  Patient does have history of echo.  The only testing that he has not had performed would be a stress test.  ROS: as mentioned in HPI  Family hx: No family history of CAD  Social hx: Denies use of illicit drugs, alcohol use Smoking status reviewed  Patient Active Problem List   Diagnosis Date Noted  . PICC (peripherally inserted central catheter) in place 09/05/2018  . Chest wall pain 08/14/2018  . At risk for dysfunction of heart 07/11/2018  . Mixed hyperlipidemia 02/28/2018  . Uncontrolled diabetes mellitus ([place]) 02/25/2018  . Phlebitis  05/12/2017  . Leg swelling 05/12/2017  . Tobacco use disorder 04/13/2017  . GERD (gastroesophageal reflux disease)   . Hypertension   . TTP (thrombotic thrombocytopenic purpura) ([place]) 02/06/2017  . Cough 02/05/2017  . Alcohol use 02/03/2017  . Renal insufficiency 02/03/2017  . Leukocytosis 02/03/2017  . Thrombocytopenia ([place]) 02/01/2017  . Hypokalemia 02/01/2017  . Macrocytic anemia 02/01/2017  . Hyperglycemia 02/01/2017  . Microscopic hematuria 02/01/2017  . Diverticulosis 02/01/2017     Objective:  BP 124/80   Ht 5\' 10"  (1.778 m)   Wt 190 lb (86.2 kg)   BMI 27.26 kg/m   Vitals and nursing note reviewed  General: NAD, pleasant Cardiac: RRR, normal heart sounds, no m/r/g Pulm: normal effort, CTAB Extremities: no edema or cyanosis. WWP. Skin: warm and dry, no rashes noted Neuro: alert and oriented, no focal deficits Psych: normal affect, normal thought content  EKG performed in office with no change from prior  Assessment & Plan:   Chest wall pain Chest pain is atypical however patient is higher risk.  Recently with normal labs not concerning for TTP.  Patient has been evaluated by cardiology before and is encouraged to follow-up with his cardiologist for work-up which may include stress test. EKG reassuring in our office.  Exam benign. Patient given sublingual nitro in order to help relieve his chest pain symptoms and may also be diagnostic if patient is having angina. Patient given contact information of cardiologist  that he previously saw and instructed to call to schedule him an appointment next week. Patient voiced understanding of plan and strict return precautions discussed.   [place] Mercedes Fort, DO Family Medicine Resident PGY-3

## 2018-11-25 NOTE — Patient Instructions (Addendum)
Thank you for coming to see me today. It was a pleasure! Today we talked about:   Your chest pain.  Your EKG here in office is normal.  Your recent lab work from oncology is also reassuring and normal so far.  Your chest pain does not sound typical for something worrisome, however it will be important for you to follow-up with your cardiology given your risk factors of hypertension and diabetes.  I recommend that you call to try to set up an appointment with them today after leaving the office.  They may recommend getting a stress test done.  In the meantime I have given you sublingual nitro.  You may take this as needed for your chest pain.  If the nitroglycerin does not relieve your chest pain then please do not continue to take it.  You may take the nitroglycerin every 5 minutes up to 3 times in order to help resolve your chest pain.  However if you have to take it more than 3 times and you will need to go to the emergency room or call 911.  Of course, if you develop any worsening chest pain, shortness of breath or are concerned then you can always go to the emergency room after office hours.  Please follow-up with your cardiologist as soon as possible. The number for their office is [phone].  If you have any questions or concerns, please do not hesitate to call the office at [phone].  Take Care,   Martin[place] Kahari Critzer, DO  Nonspecific Chest Pain Chest pain can be caused by many different conditions. Some causes of chest pain can be life-threatening. These will require treatment right away. Serious causes of chest pain include:  Heart attack.  A tear in the body's main blood vessel.  Redness and swelling (inflammation) around your heart.  Blood clot in your lungs. Other causes of chest pain may not be so serious. These include:  Heartburn.  Anxiety or stress.  Damage to bones or muscles in your chest.  Lung infections. Chest pain can feel like:  Pain or discomfort in  your chest.  Crushing, pressure, aching, or squeezing pain.  Burning or tingling.  Dull or sharp pain that is worse when you move, cough, or take a deep breath.  Pain or discomfort that is also felt in your back, neck, jaw, shoulder, or arm, or pain that spreads to any of these areas. It is hard to know whether your pain is caused by something that is serious or something that is not so serious. So it is important to see your doctor right away if you have chest pain. Follow these instructions at home: Medicines  Take over-the-counter and prescription medicines only as told by your doctor.  If you were prescribed an antibiotic medicine, take it as told by your doctor. Do not stop taking the antibiotic even if you start to feel better. Lifestyle   Rest as told by your doctor.  Do not use any products that contain nicotine or tobacco, such as cigarettes, e-cigarettes, and chewing tobacco. If you need help quitting, ask your doctor.  Do not drink alcohol.  Make lifestyle changes as told by your doctor. These may include: ? Getting regular exercise. Ask your doctor what activities are safe for you. ? Eating a heart-healthy diet. A diet and nutrition specialist (dietitian) can help you to learn healthy eating options. ? Staying at a healthy weight. ? Treating diabetes or high blood pressure, if needed. ? Lowering your  stress. Activities such as yoga and relaxation techniques can help. General instructions  Pay attention to any changes in your symptoms. Tell your doctor about them or any new symptoms.  Avoid any activities that cause chest pain.  Keep all follow-up visits as told by your doctor. This is important. You may need more testing if your chest pain does not go away. Contact a doctor if:  Your chest pain does not go away.  You feel depressed.  You have a fever. Get help right away if:  Your chest pain is worse.  You have a cough that gets worse, or you cough up  blood.  You have very bad (severe) pain in your belly (abdomen).  You pass out (faint).  You have either of these for no clear reason: ? Sudden chest discomfort. ? Sudden discomfort in your arms, back, neck, or jaw.  You have shortness of breath at any time.  You suddenly start to sweat, or your skin gets clammy.  You feel sick to your stomach (nauseous).  You throw up (vomit).  You suddenly feel lightheaded or dizzy.  You feel very weak or tired.  Your heart starts to beat fast, or it feels like it is skipping beats. These symptoms may be an emergency. Do not wait to see if the symptoms will go away. Get medical help right away. Call your local emergency services (911 in the [place] Do not drive yourself to the hospital. Summary  Chest pain can be caused by many different conditions. The cause may be serious and need treatment right away. If you have chest pain, see your doctor right away.  Follow your doctor's instructions for taking medicines and making lifestyle changes.  Keep all follow-up visits as told by your doctor. This includes visits for any further testing if your chest pain does not go away.  Be sure to know the signs that show that your condition has become worse. Get help right away if you have these symptoms. This information is not intended to replace advice given to you by your health care provider. Make sure you discuss any questions you have with your health care provider. Document Released: 09/30/2007 Document Revised: 10/14/2017 Document Reviewed: 10/14/2017 Elsevier Patient Education  2020 [employer].

## 2018-11-26 ENCOUNTER — Other Ambulatory Visit: Payer: Self-pay

## 2018-11-26 ENCOUNTER — Emergency Department (HOSPITAL_COMMUNITY): Payer: Commercial Managed Care - PPO

## 2018-11-26 ENCOUNTER — Encounter (HOSPITAL_COMMUNITY): Payer: Self-pay | Admitting: Emergency Medicine

## 2018-11-26 ENCOUNTER — Emergency Department (HOSPITAL_COMMUNITY)
Admission: EM | Admit: 2018-11-26 | Discharge: 2018-11-26 | Disposition: A | Discharge status: Home / Self Care | Payer: Commercial Managed Care - PPO | Attending: Emergency Medicine | Admitting: Emergency Medicine

## 2018-11-26 DIAGNOSIS — Z862 Personal history of diseases of the blood and blood-forming organs and certain disorders involving the immune mechanism: Secondary | ICD-10-CM | POA: Diagnosis not present

## 2018-11-26 DIAGNOSIS — R0789 Other chest pain: Secondary | ICD-10-CM | POA: Diagnosis not present

## 2018-11-26 DIAGNOSIS — Z79899 Other long term (current) drug therapy: Secondary | ICD-10-CM | POA: Insufficient documentation

## 2018-11-26 DIAGNOSIS — I10 Essential (primary) hypertension: Secondary | ICD-10-CM

## 2018-11-26 DIAGNOSIS — E876 Hypokalemia: Secondary | ICD-10-CM | POA: Diagnosis not present

## 2018-11-26 DIAGNOSIS — R079 Chest pain, unspecified: Secondary | ICD-10-CM | POA: Diagnosis present

## 2018-11-26 DIAGNOSIS — F17210 Nicotine dependence, cigarettes, uncomplicated: Secondary | ICD-10-CM | POA: Diagnosis not present

## 2018-11-26 DIAGNOSIS — Z794 Long term (current) use of insulin: Secondary | ICD-10-CM | POA: Insufficient documentation

## 2018-11-26 DIAGNOSIS — E119 Type 2 diabetes mellitus without complications: Secondary | ICD-10-CM | POA: Diagnosis not present

## 2018-11-26 LAB — BASIC METABOLIC PANEL
Anion gap: 13 (ref 5–15)
BUN: 14 mg/dL (ref 6–20)
CO2: 25 mmol/L (ref 22–32)
Calcium: 9.4 mg/dL (ref 8.9–10.3)
Chloride: 101 mmol/L (ref 98–111)
Creatinine, Ser: 1 mg/dL (ref 0.61–1.24)
GFR calc Af Amer: >60 mL/min (ref >60)
GFR calc non Af Amer: >60 mL/min (ref >60)
Glucose, Bld: 148 mg/dL — ABNORMAL HIGH (ref 70–99)
Potassium: 3.1 mmol/L — ABNORMAL LOW (ref 3.5–5.1)
Sodium: 139 mmol/L (ref 135–145)

## 2018-11-26 LAB — TROPONIN I (HIGH SENSITIVITY)
Troponin I (High Sensitivity): 6 ng/L (ref <18)
Troponin I (High Sensitivity): 7 ng/L (ref <18)

## 2018-11-26 LAB — CBC
HCT: 48.5 % (ref 39.0–52.0)
Hemoglobin: 16.5 g/dL (ref 13.0–17.0)
MCH: 30.4 pg (ref 26.0–34.0)
MCHC: 34 g/dL (ref 30.0–36.0)
MCV: 89.3 fL (ref 80.0–100.0)
Platelets: 235 10*3/uL (ref 150–400)
RBC: 5.43 MIL/uL (ref 4.22–5.81)
RDW: 13.2 % (ref 11.5–15.5)
WBC: 10.3 10*3/uL (ref 4.0–10.5)
nRBC: 0 % (ref 0.0–0.2)

## 2018-11-26 MED ORDER — POTASSIUM CHLORIDE CRYS ER 20 MEQ PO TBCR: 40.0000 meq | EXTENDED_RELEASE_TABLET | Freq: Once | ORAL | Status: DC

## 2018-11-26 NOTE — ED Provider Notes (Signed)
I have personally seen and examined the patient. I have reviewed the documentation on PMH/FH/Soc Hx. I have discussed the plan of care with the resident and patient.  I have reviewed and agree with the resident's documentation. Please see associated encounter note.  Briefly, the patient is a 43 y.o. male here with chest discomfort. Normal vitals, well appearing.  Patient with history of diabetes, hypertension, high cholesterol.  Patient with chest pain over the last several hours but has been intermittent over the last several days.  Got worse after moving some heavy objects.  Patient denies any shortness of breath.  Has been awaiting coronavirus test for the last 2 weeks.  Does not have any fever, no cough, no sputum production.  Chest x-ray showed no signs of pneumonia, no pneumothorax.  Troponin negative x 2.  Heart score is 3.  EKG reassuring.  No new ischemic changes.  Overall atypical chest pain.  Doubt PE.  Doubt ACS.  Recommend follow-up with primary care doctor.  Likely musculoskeletal pain.  Given return precautions and discharged from ED in good condition.  This chart was dictated using voice recognition software.  Despite best efforts to proofread,  errors can occur which can change the documentation meaning.     EKG Interpretation  Date/Time:  Saturday November 26 2018 19:35:23 EDT Ventricular Rate:  84 PR Interval:  138 QRS Duration: 104 QT Interval:  380 QTC Calculation: 449 R Axis:   95 Text Interpretation:  Normal sinus rhythm with sinus arrhythmia Rightward axis Nonspecific T wave abnormality Abnormal ECG Confirmed by Lennice Sites [phone]) on 11/26/2018 9:49:23 PM         Lennice Sites, DO 11/26/18 2351

## 2018-11-26 NOTE — ED Triage Notes (Signed)
Patient reports intermittent central chest pain for several weeks , denies SOB , no emesis or diaphoresis , rates pain 3/10 at arrival , history of HTN/Diabetes .

## 2018-11-26 NOTE — ED Provider Notes (Signed)
[facility] EMERGENCY DEPARTMENT Provider Note   CSN: [id number] Arrival date & time: 11/26/18  1929    History   Chief Complaint Chief Complaint  Patient presents with  . Chest Pain    HPI Eric Hooper is a 43 y.o. male.  HPI: A 43 year old patient with a history of treated diabetes, hypertension and hypercholesterolemia presents for evaluation of chest pain. Initial onset of pain was more than 6 hours ago. The patient's chest pain is well-localized and is not worse with exertion. The patient's chest pain is not middle- or left-sided, is not described as heaviness/pressure/tightness, is not sharp and does not radiate to the arms/jaw/neck. The patient does not complain of nausea and denies diaphoresis. The patient has smoked in the past 90 days. The patient has no history of stroke, has no history of peripheral artery disease, has no relevant family history of coronary artery disease (first degree relative at less than age 30) and does not have an elevated BMI (>=30).   HPI Presents for evaluation of chest pain.  He describes as onset 1 week ago the day after he was moving some rocks in his yard.  It is a achy pain that is generally brief.  Worse with movements.  Not exertional.  States that it tends to be in the upper middle of his chest but also moves to the left and right sometimes.  It is better when he lays still.  His breathing may have coronavirus.  No persistent cough or other constitutional symptoms of illness.  Was evaluated by his PCP yesterday who reassured him but due to persistence of symptoms he became concerned so came in tonight.  Past Medical History:  Diagnosis Date  . Controlled diabetes mellitus type 2 with complications ([place]) 48/08/4625  . Diabetes mellitus without complication (HCC)    non-insulin dependent  . Diverticulosis 02/01/2017   on CT scan abd/pelvis  . GERD (gastroesophageal reflux disease)   . Hypertension   . Kidney stones 02/03/2017   . Mixed hyperlipidemia 02/28/2018  . Smoker 04/13/2017    Patient Active Problem List   Diagnosis Date Noted  . PICC (peripherally inserted central catheter) in place 09/05/2018  . Chest wall pain 08/14/2018  . At risk for dysfunction of heart 07/11/2018  . Mixed hyperlipidemia 02/28/2018  . Uncontrolled diabetes mellitus ([place]) 02/25/2018  . Phlebitis 05/12/2017  . Leg swelling 05/12/2017  . Tobacco use disorder 04/13/2017  . GERD (gastroesophageal reflux disease)   . Hypertension   . TTP (thrombotic thrombocytopenic purpura) ([place]) 02/06/2017  . Cough 02/05/2017  . Alcohol use 02/03/2017  . Renal insufficiency 02/03/2017  . Leukocytosis 02/03/2017  . Thrombocytopenia ([place]) 02/01/2017  . Hypokalemia 02/01/2017  . Macrocytic anemia 02/01/2017  . Hyperglycemia 02/01/2017  . Microscopic hematuria 02/01/2017  . Diverticulosis 02/01/2017    Past Surgical History:  Procedure Laterality Date  . ELBOW SURGERY Right   . IR FLUORO GUIDE CV LINE RIGHT  02/02/2017  . IR US GUIDE VASC ACCESS RIGHT  02/02/2017        Home Medications    Prior to Admission medications   Medication Sig Start Date End Date Taking? Authorizing Provider  acetaminophen (TYLENOL) 500 MG tablet Take 1 tablet (500 mg total) by mouth every 6 (six) hours as needed (pain). Patient not taking: Reported on 11/21/2018 08/19/18   Patriciaann Clan, DO  amLODipine (NORVASC) 10 MG tablet TAKE 1 TABLET BY MOUTH EVERY DAY 11/07/18   Bonnita Hollow, MD  atorvastatin (  LIPITOR) 40 MG tablet Take 1 tablet (40 mg total) by mouth daily. 09/02/18   Bonnita Hollow, MD  blood glucose meter kit and supplies KIT Dispense based on patient and insurance preference. Use up to four times daily as directed. (FOR ICD-9 250.00, 250.01). 08/25/18   Bonnita Hollow, MD  blood glucose meter kit and supplies Dispense based on patient and insurance preference. Use up to four times daily as directed. (FOR ICD-10 E10.9, E11.9). 09/02/18    Bonnita Hollow, MD  cetirizine (ZYRTEC) 10 MG tablet Take 1 tablet (10 mg total) by mouth daily. 11/25/18   Bonnita Hollow, MD  esomeprazole (NEXIUM) 40 MG capsule Take 1 capsule (40 mg total) by mouth 2 (two) times daily before a meal. 07/11/18   Bonnita Hollow, MD  metFORMIN (GLUCOPHAGE) 1000 MG tablet Take 1 tablet (1,000 mg total) by mouth 2 (two) times daily. 10/13/18 01/11/19  Bonnita Hollow, MD  nitroGLYCERIN (NITROSTAT) 0.3 MG SL tablet Place 1 tablet (0.3 mg total) under the tongue every 5 (five) minutes as needed for chest pain. 11/25/18   Shirley, [place], DO  potassium chloride SA (K-DUR) 20 MEQ tablet Take 1 tablet (20 mEq total) by mouth daily. 11/11/18   Owens Shark, NP    Family History Family History  Problem Relation Age of Onset  . Hypertension Mother   . Hyperlipidemia Mother   . Hypertension Father   . Colon cancer Maternal Aunt     Social History Social History   Tobacco Use  . Smoking status: Current Every Day Smoker    Packs/day: 0.50    Years: 15.00    Pack years: 7.50    Types: Cigarettes  . Smokeless tobacco: Never Used  Substance Use Topics  . Alcohol use: Yes    Comment: 3-4 beers per day most days  . Drug use: No     Allergies   Ibuprofen   Review of Systems Review of Systems  Cardiovascular: Positive for chest pain.  All other systems reviewed and are negative.    Physical Exam Updated Vital Signs BP (!) 144/91   Pulse 67   Temp 98.4 F (36.9 C) (Oral)   Resp (!) 21   Ht _0  (1.778 m)   Wt 86.2 kg   SpO2 99%   BMI 27.26 kg/m   Physical Exam Vitals signs and nursing note reviewed.  Constitutional:      Appearance: He is well-developed.  HENT:     Head: Normocephalic and atraumatic.  Eyes:     Conjunctiva/sclera: Conjunctivae normal.  Neck:     Musculoskeletal: Neck supple.  Cardiovascular:     Rate and Rhythm: Normal rate and regular rhythm.     Heart sounds: No murmur.  Pulmonary:     Effort: Pulmonary  effort is normal. No respiratory distress.     Breath sounds: Normal breath sounds.  Abdominal:     Palpations: Abdomen is soft.     Tenderness: There is no abdominal tenderness.  Skin:    General: Skin is warm and dry.  Neurological:     Mental Status: He is alert.      ED Treatments / Results  Labs (all labs ordered are listed, but only abnormal results are displayed) Labs Reviewed  BASIC METABOLIC PANEL - Abnormal; Notable for the following components:      Result Value   Potassium 3.1 (*)    Glucose, Bld 148 (*)    All other components within  normal limits  CBC  TROPONIN I (HIGH SENSITIVITY)  TROPONIN I (HIGH SENSITIVITY)    EKG EKG Interpretation  Date/Time:  Saturday November 26 2018 19:35:23 EDT Ventricular Rate:  84 PR Interval:  138 QRS Duration: 104 QT Interval:  380 QTC Calculation: 449 R Axis:   95 Text Interpretation:  Normal sinus rhythm with sinus arrhythmia Rightward axis Nonspecific T wave abnormality Abnormal ECG Confirmed by Lennice Sites [phone]) on 11/26/2018 9:49:23 PM   Radiology Dg Chest 2 View  Result Date: 11/26/2018 CLINICAL DATA:  Mid chest pain for 1 week. EXAM: CHEST - 2 VIEW COMPARISON:  08/15/2018 FINDINGS: The heart size and mediastinal contours are within normal limits. Both lungs are clear. No pleural effusion or pneumothorax. The visualized skeletal structures are unremarkable. IMPRESSION: No active cardiopulmonary disease. Electronically Signed   By: Lajean Manes M.D.   On: 11/26/2018 20:07    Procedures Procedures (including critical care time)  Medications Ordered in ED Medications  potassium chloride SA (K-DUR) CR tablet 40 mEq (has no administration in time range)     Initial Impression / Assessment and Plan / ED Course  I have reviewed the triage vital signs and the nursing notes.  Pertinent labs & imaging results that were available during my care of the patient were reviewed by me and considered in my medical decision  making (see chart for details).     HEAR Score: 3  Mr. Suttles is a 43 year old male with a history of TTP now in remission, GERD, hyperglycemia, hypertension, non-insulin-dependent diabetes.  Here for chest pain as above.  It is atypical.  Low risk here score of 3.  Delta troponins were normal.  No significant electrolyte abnormality with exception of mild hypokalemia at 3.1 but no ECG changes and I doubt that this is contributing to his symptoms.  Give 1 dose of p.o. potassium here.  Instructed to follow-up closely with PCP.  No significant leukocytosis or anemia.  Overall there is no clinical or laboratory signs of infection, ischemia, or other emergent process.  He is well-appearing on exam.  Appropriate for discharge with outpatient follow-up.  Patient vocalized understanding and agreement with plan. Hand no other questions or concerns. Was given relevant verbal and written information regarding aftercare and return precautions and discharged in good condition.    Final Clinical Impressions(s) / ED Diagnoses   Final diagnoses:  Chest wall pain  Hypokalemia  History of TTP (thrombotic thrombocytopenic purpura)  Type 2 diabetes mellitus without complication, without long-term current use of insulin [facility])  Essential hypertension    ED Discharge Orders    None       Tillie Fantasia, MD 11/26/18 [place], [place], DO 11/26/18 2351

## 2018-11-29 NOTE — Assessment & Plan Note (Addendum)
Chest pain is atypical however patient is higher risk.  Recently with normal labs not concerning for TTP.  Patient has been evaluated by cardiology before and is encouraged to follow-up with his cardiologist for work-up which may include stress test. EKG reassuring in our office.  Exam benign. Patient given sublingual nitro in order to help relieve his chest pain symptoms and may also be diagnostic if patient is having angina. Patient given contact information of cardiologist that he previously saw and instructed to call to schedule him an appointment next week. Patient voiced understanding of plan and strict return precautions discussed.

## 2018-12-03 ENCOUNTER — Other Ambulatory Visit: Payer: Self-pay | Admitting: Nurse Practitioner

## 2018-12-05 ENCOUNTER — Other Ambulatory Visit: Payer: Self-pay

## 2018-12-05 DIAGNOSIS — I10 Essential (primary) hypertension: Secondary | ICD-10-CM

## 2018-12-06 MED ORDER — AMLODIPINE BESYLATE 10 MG PO TABS: 10.0000 mg | ORAL_TABLET | Freq: Every day | ORAL | 1 refills | Status: DC

## 2018-12-13 ENCOUNTER — Other Ambulatory Visit: Payer: Self-pay | Admitting: Family Medicine

## 2018-12-21 ENCOUNTER — Telehealth: Payer: Self-pay | Admitting: *Deleted

## 2018-12-21 DIAGNOSIS — M3119 Other thrombotic microangiopathy: Secondary | ICD-10-CM

## 2018-12-21 DIAGNOSIS — M311 Thrombotic microangiopathy: Secondary | ICD-10-CM

## 2018-12-21 NOTE — Telephone Encounter (Addendum)
Patient left VM yesterday at 1620 requesting labs on 8/26. Having some pain in his chest and states this is how he felt when his last platelet drop occurred. Per Dr. Benay Spice: OK for labs today. Left VM at home to call to schedule. Patient called back to request labs for 8/27 around 2 pm. Scheduled for 2:15 pm and patient notified via VM.

## 2018-12-22 ENCOUNTER — Inpatient Hospital Stay: Payer: Commercial Managed Care - PPO | Attending: Oncology

## 2018-12-22 ENCOUNTER — Other Ambulatory Visit: Payer: Self-pay

## 2018-12-22 DIAGNOSIS — M3119 Other thrombotic microangiopathy: Secondary | ICD-10-CM

## 2018-12-22 DIAGNOSIS — M311 Thrombotic microangiopathy: Secondary | ICD-10-CM | POA: Diagnosis not present

## 2018-12-22 LAB — CBC WITH DIFFERENTIAL (CANCER CENTER ONLY)
Abs Immature Granulocytes: 0.02 10*3/uL (ref 0.00–0.07)
Basophils Absolute: 0 10*3/uL (ref 0.0–0.1)
Basophils Relative: 1 %
Eosinophils Absolute: 0.3 10*3/uL (ref 0.0–0.5)
Eosinophils Relative: 5 %
HCT: 45.7 % (ref 39.0–52.0)
Hemoglobin: 15.8 g/dL (ref 13.0–17.0)
Immature Granulocytes: 0 %
Lymphocytes Relative: 48 %
Lymphs Abs: 3.6 10*3/uL (ref 0.7–4.0)
MCH: 30.4 pg (ref 26.0–34.0)
MCHC: 34.6 g/dL (ref 30.0–36.0)
MCV: 88.1 fL (ref 80.0–100.0)
Monocytes Absolute: 0.7 10*3/uL (ref 0.1–1.0)
Monocytes Relative: 10 %
Neutro Abs: 2.7 10*3/uL (ref 1.7–7.7)
Neutrophils Relative %: 36 %
Platelet Count: 227 10*3/uL (ref 150–400)
RBC: 5.19 MIL/uL (ref 4.22–5.81)
RDW: 14.1 % (ref 11.5–15.5)
WBC Count: 7.4 10*3/uL (ref 4.0–10.5)
nRBC: 0 % (ref 0.0–0.2)

## 2018-12-22 LAB — LACTATE DEHYDROGENASE: LDH: 160 U/L (ref 98–192)

## 2018-12-22 LAB — CMP (CANCER CENTER ONLY)
ALT: 51 U/L — ABNORMAL HIGH (ref 0–44)
AST: 25 U/L (ref 15–41)
Albumin: 4.5 g/dL (ref 3.5–5.0)
Alkaline Phosphatase: 86 U/L (ref 38–126)
Anion gap: 9 (ref 5–15)
BUN: 19 mg/dL (ref 6–20)
CO2: 25 mmol/L (ref 22–32)
Calcium: 10.2 mg/dL (ref 8.9–10.3)
Chloride: 111 mmol/L (ref 98–111)
Creatinine: 1.1 mg/dL (ref 0.61–1.24)
GFR, Est AFR Am: >60 mL/min (ref >60)
GFR, Estimated: >60 mL/min (ref >60)
Glucose, Bld: 127 mg/dL — ABNORMAL HIGH (ref 70–99)
Potassium: 3.9 mmol/L (ref 3.5–5.1)
Sodium: 145 mmol/L (ref 135–145)
Total Bilirubin: 1.1 mg/dL (ref 0.3–1.2)
Total Protein: 7.4 g/dL (ref 6.5–8.1)

## 2018-12-27 ENCOUNTER — Telehealth: Payer: Self-pay | Admitting: *Deleted

## 2018-12-27 NOTE — Telephone Encounter (Signed)
-----   Message from Ladell Pier, MD sent at 12/22/2018  5:14 PM EDT ----- Please call patient, hb, platelets and ldh are normal,no evidence for active TTP  F/u with primary provider for persistent chest pain

## 2018-12-27 NOTE — Telephone Encounter (Signed)
Per Dr. Benay Spice, called and left vmail to let pt know lab results are within normal range and to follow up with PCP for chest pain and to call with any other concerns.

## 2019-01-01 ENCOUNTER — Other Ambulatory Visit: Payer: Self-pay | Admitting: Oncology

## 2019-02-23 ENCOUNTER — Inpatient Hospital Stay: Payer: 59

## 2019-02-23 ENCOUNTER — Telehealth: Payer: Self-pay | Admitting: Oncology

## 2019-02-23 ENCOUNTER — Inpatient Hospital Stay: Payer: 59 | Admitting: Oncology

## 2019-02-23 ENCOUNTER — Telehealth: Payer: Self-pay | Admitting: *Deleted

## 2019-02-23 NOTE — Telephone Encounter (Signed)
Scheduled appt per 10/29 sch message - spoke with patient - he was unable to schedule appt but he is my chart and will check the appt on my chart when he gets home . If there is any conflict he will give [place] a call back

## 2019-02-23 NOTE — Telephone Encounter (Signed)
Patient left VM with answering service to cancel his lab/OV today. Scheduling message sent to reschedule for next few weeks.

## 2019-03-09 ENCOUNTER — Telehealth: Payer: Self-pay | Admitting: Oncology

## 2019-03-09 ENCOUNTER — Telehealth: Payer: Self-pay | Admitting: *Deleted

## 2019-03-09 NOTE — Telephone Encounter (Signed)
Called to request moving his 11/16 lab/OV to a Friday after 3 pm due to his work schedule (not 11/13). Please call Verdis Frederickson, since patient not allowed to answer his phone at work. Scheduling message sent.

## 2019-03-09 NOTE — Telephone Encounter (Signed)
R/s appt per 11/12 sch message - pt fiance aware of appt date and time

## 2019-03-10 ENCOUNTER — Other Ambulatory Visit: Payer: Self-pay | Admitting: Oncology

## 2019-03-10 ENCOUNTER — Telehealth: Payer: Self-pay | Admitting: Family Medicine

## 2019-03-10 NOTE — Telephone Encounter (Signed)
Called patient to get some clarification on this message.  He states that he received a call from [facility] saying that he was due for an A1C and an eye exam.  Patient was inquiring when he would be receiving the home kit for this.  I advised him that it could take a couple of weeks but that it is best that he follow up in our office anyway for his diabetes as it has been about 6 months.  Patient has been seeing [facility] on a regular basis and having his labs done with Dr. Benay Spice but was agreeable to making a follow up appt here in December.  Appt made for 03-31-2019. Rossie Scarfone,CMA

## 2019-03-10 NOTE — Telephone Encounter (Signed)
Patient is asking when he will get a kit regarding checking his a1c.  Also, he needs to have his eyes checked and so should he do that here or does he need a referral?  Please call [phone].

## 2019-03-13 ENCOUNTER — Other Ambulatory Visit: Payer: 59

## 2019-03-13 ENCOUNTER — Ambulatory Visit: Payer: 59 | Admitting: Oncology

## 2019-03-13 ENCOUNTER — Telehealth: Payer: Self-pay | Admitting: *Deleted

## 2019-03-13 NOTE — Telephone Encounter (Signed)
Asking if he can have a flu vaccine. Per Dr. Benay Spice: yes, he should get a flu vaccine.

## 2019-03-14 ENCOUNTER — Telehealth: Payer: Self-pay | Admitting: *Deleted

## 2019-03-14 NOTE — Telephone Encounter (Signed)
Call requesting refill on K+. Has been out a few days. Per Dr. Benay Spice: May not need it any longer. Will check BMP on 11/20 visit. Would prefer his PCP take this over in future if needed. Left VM for Ms. Dixon with this information.

## 2019-03-17 ENCOUNTER — Inpatient Hospital Stay: Payer: 59 | Admitting: Nurse Practitioner

## 2019-03-17 ENCOUNTER — Inpatient Hospital Stay: Payer: 59

## 2019-03-17 ENCOUNTER — Telehealth: Payer: Self-pay | Admitting: Nurse Practitioner

## 2019-03-17 NOTE — Telephone Encounter (Signed)
Returned patient's phone call regarding cancelling 11/20 appointment, per patient's request and per 11/20 scheduled message appointment has been cancelled. Left a voicemail for patient to reschedule.

## 2019-03-31 ENCOUNTER — Ambulatory Visit: Payer: 59 | Admitting: Family Medicine

## 2019-04-02 IMAGING — CT CT ABD-PELV W/ CM
3 of 5 series · 16 of 46 positions shown, 18 images · IV contrast (iopamidol)
Comparison: None.

CLINICAL DATA: Upper abdominal pain for 1-2 years.

EXAM:
CT ABDOMEN AND PELVIS WITH CONTRAST
TECHNIQUE: Multidetector CT imaging of the abdomen and pelvis was performed
using the standard protocol following bolus administration of
intravenous contrast.
CONTRAST:  100 cc KIZRGG-1HH IOPAMIDOL (KIZRGG-1HH) INJECTION 61%

[Series 3: abdomen 5.0 · axial · 0.69mm/px · z∈[+903,+1283]mm · 10 of 94 slices shown, 12 images]
[im 9/94  soft-tissue]
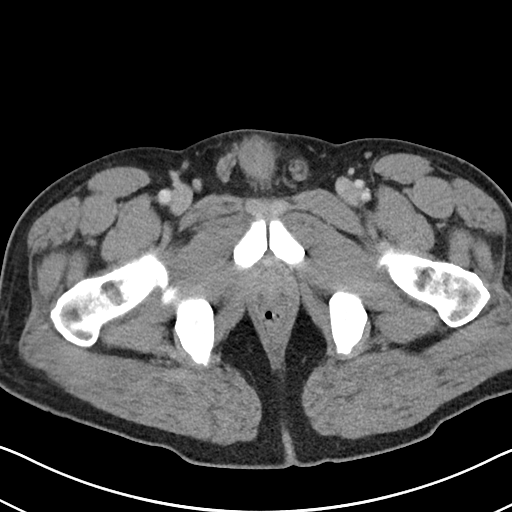
[im 9/94  bone]
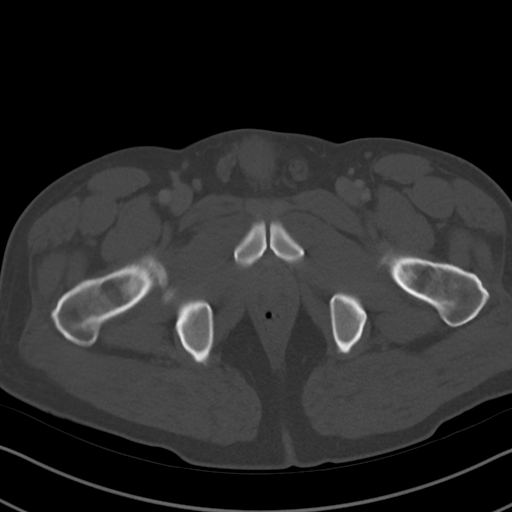
[im 17/94  soft-tissue]
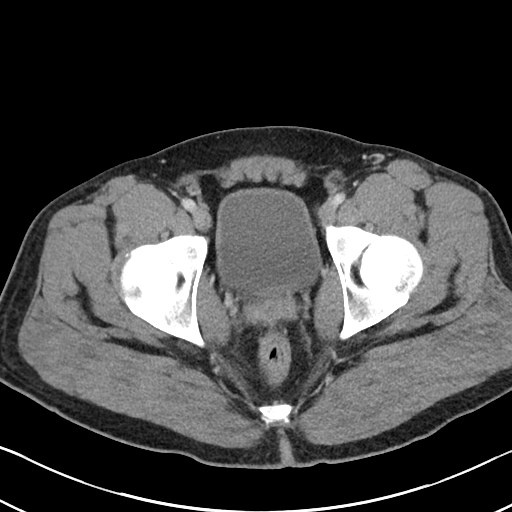
[im 26/94  soft-tissue]
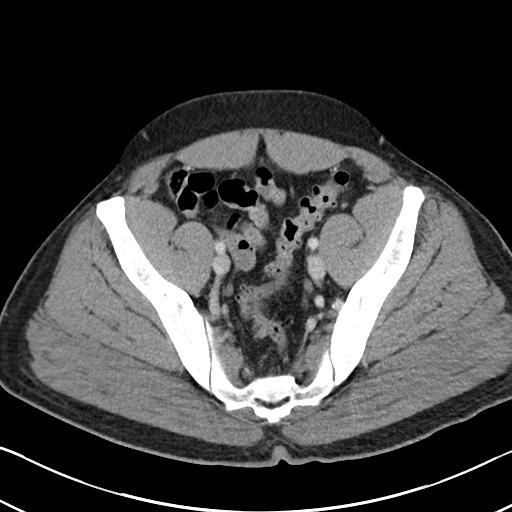
[im 34/94  soft-tissue]
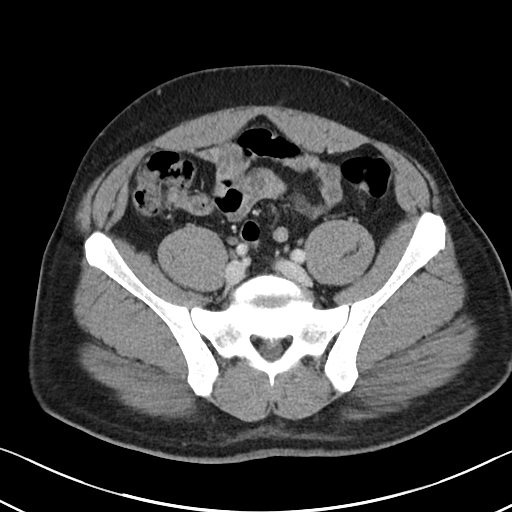
[im 43/94  soft-tissue]
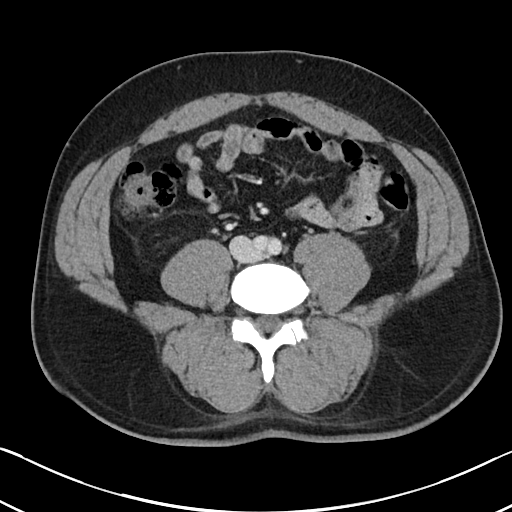
[im 51/94  soft-tissue]
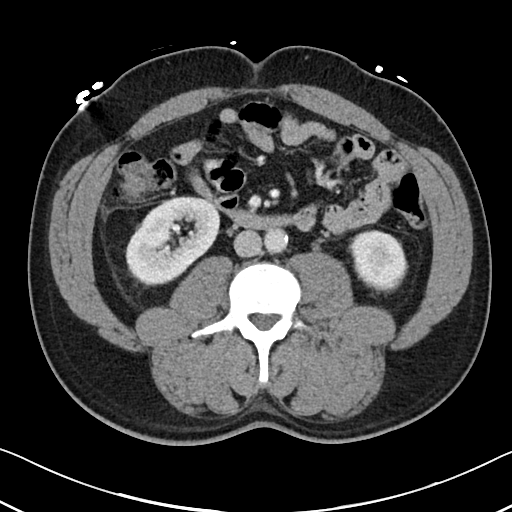
[im 60/94  soft-tissue]
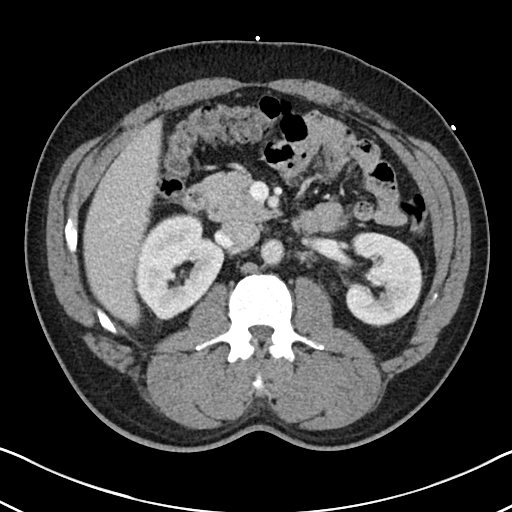
[im 68/94  soft-tissue]
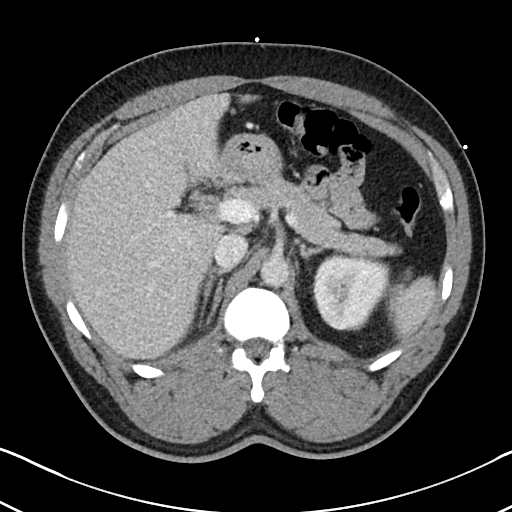
[im 77/94  soft-tissue]
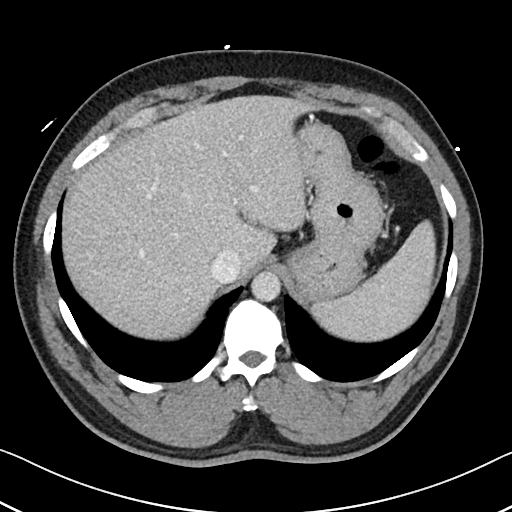
[im 77/94  bone]
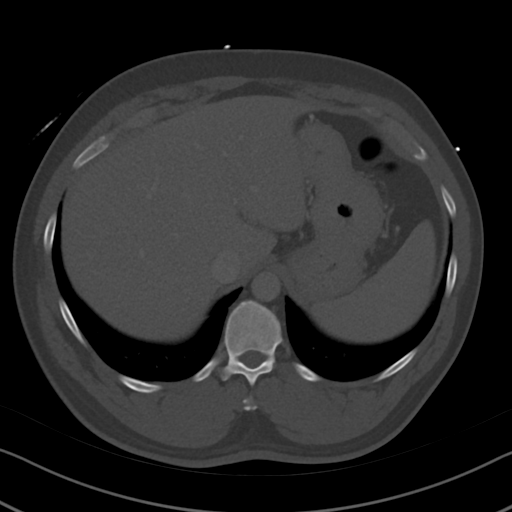
[im 85/94  soft-tissue]
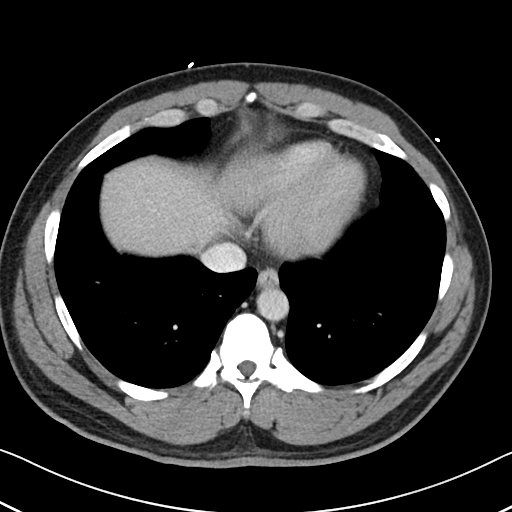

[Series 5: lung · axial · 0.69mm/px · z∈[+1132,+1192]mm · 3 of 106 slices shown]
[im 8/106  bone]
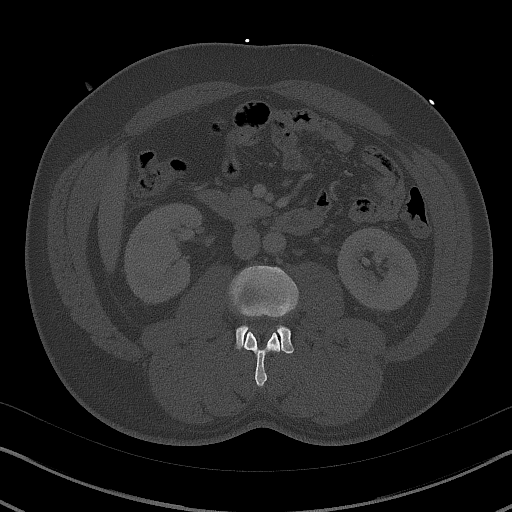
[im 23/106  bone]
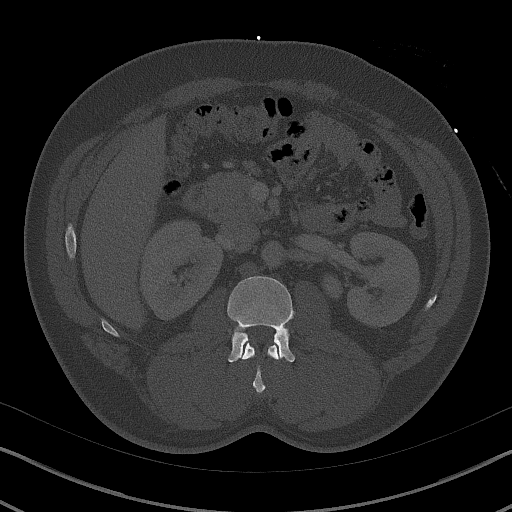
[im 38/106  bone]
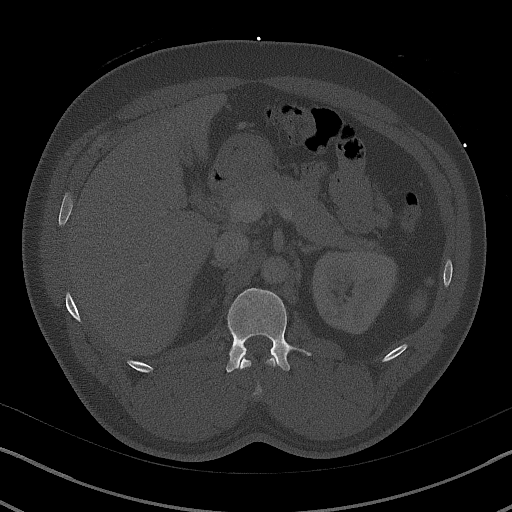

[Series 6: abdomen 3.0 mpr cor · coronal · 0.67mm/px · 3 of 92 slices shown]
[im 31/92  soft-tissue]
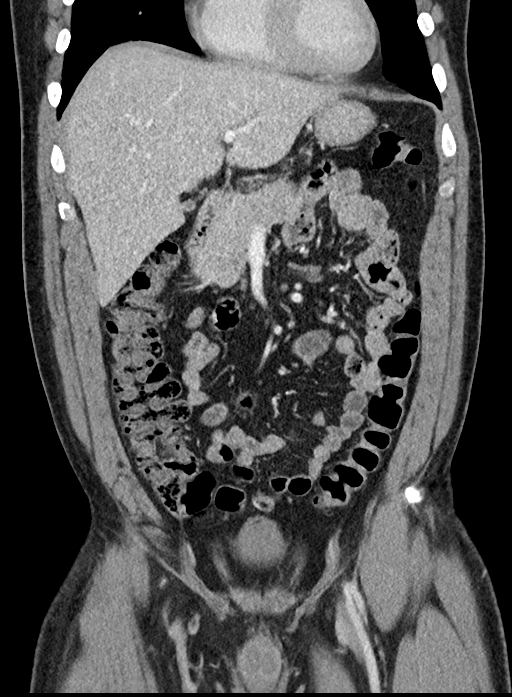
[im 41/92  soft-tissue]
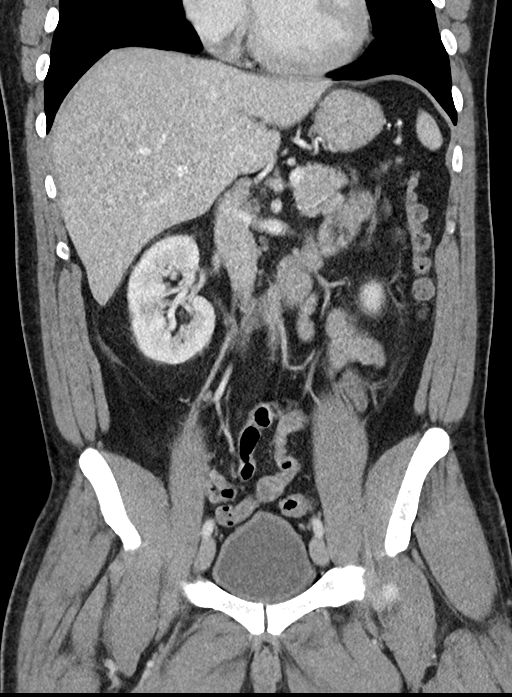
[im 51/92  soft-tissue]
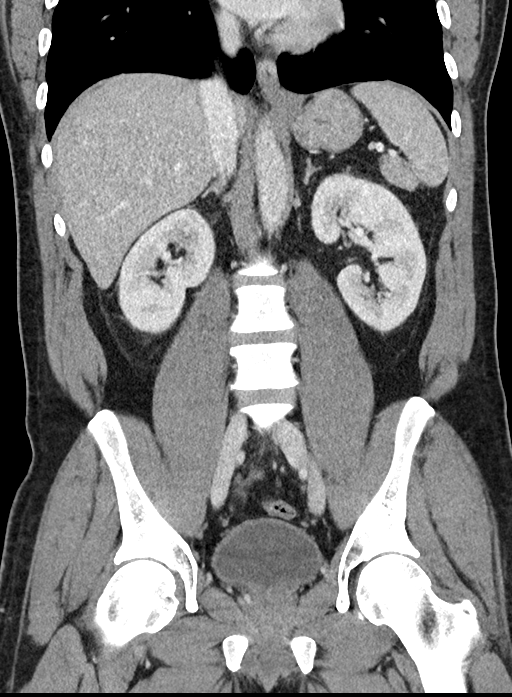

[16 of 46 positions shown; findings below may reference images not displayed]

FINDINGS: Lower chest: No acute abnormality.

Hepatobiliary: No focal liver abnormality is seen. No gallstones,
gallbladder wall thickening, or biliary dilatation.

Pancreas: Unremarkable. No pancreatic ductal dilatation or
surrounding inflammatory changes.

Spleen: Normal in size without focal abnormality.

Adrenals/Urinary Tract: Adrenal glands are unremarkable. Kidneys are
normal, without renal calculi, focal lesion, or hydronephrosis.
Bladder is unremarkable.

Stomach/Bowel: Diffuse mucosal thickening of the gastric mucosa.
Appendix is not identified but there are no secondary signs of
appendicitis. No evidence of bowel wall thickening, distention, or
inflammatory changes. Left colonic diverticulosis.

Vascular/Lymphatic: No significant vascular findings are present. No
enlarged abdominal or pelvic lymph nodes.

Reproductive: Prostate is unremarkable.

Other: No abdominal wall hernia or abnormality. Minimal amount of
free fluid in the posterior pelvis.

Musculoskeletal: No acute or significant osseous findings.
IMPRESSION: Diffuse mucosal thickening of the gastric wall. This may represent
inflammatory changes versus an infiltrative process. Correlation
with upper endoscopy may be considered.

Left colonic diverticulosis, without CT evidence of diverticulitis.

Small amount of free fluid in the pelvis, nonspecific finding
usually associated with inflammatory changes.

No CT evidence of abnormalities within the solid abdominal organs.

## 2019-04-03 IMAGING — US IR FLUORO GUIDE CV LINE*R*
1 series · 2 of 2 positions shown · non-contrast
Comparison: none

CLINICAL DATA: Thrombocytopenia, TTP and need for non tunneled
dialysis catheter for plasmapheresis.

[Series 1: ir fluoro guide cv line*right* · 2 of 2 slices shown]
[im 1/2]
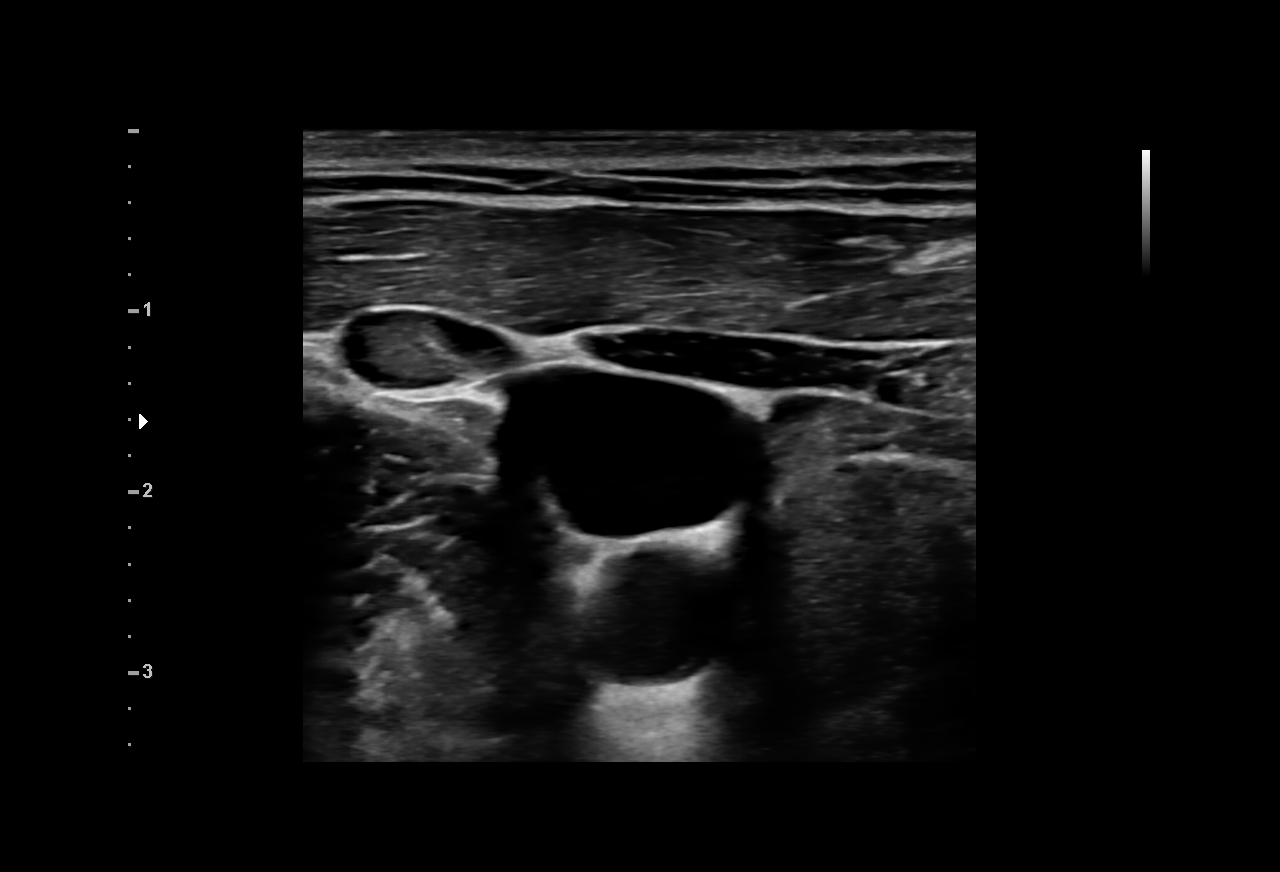
[im 2/2]
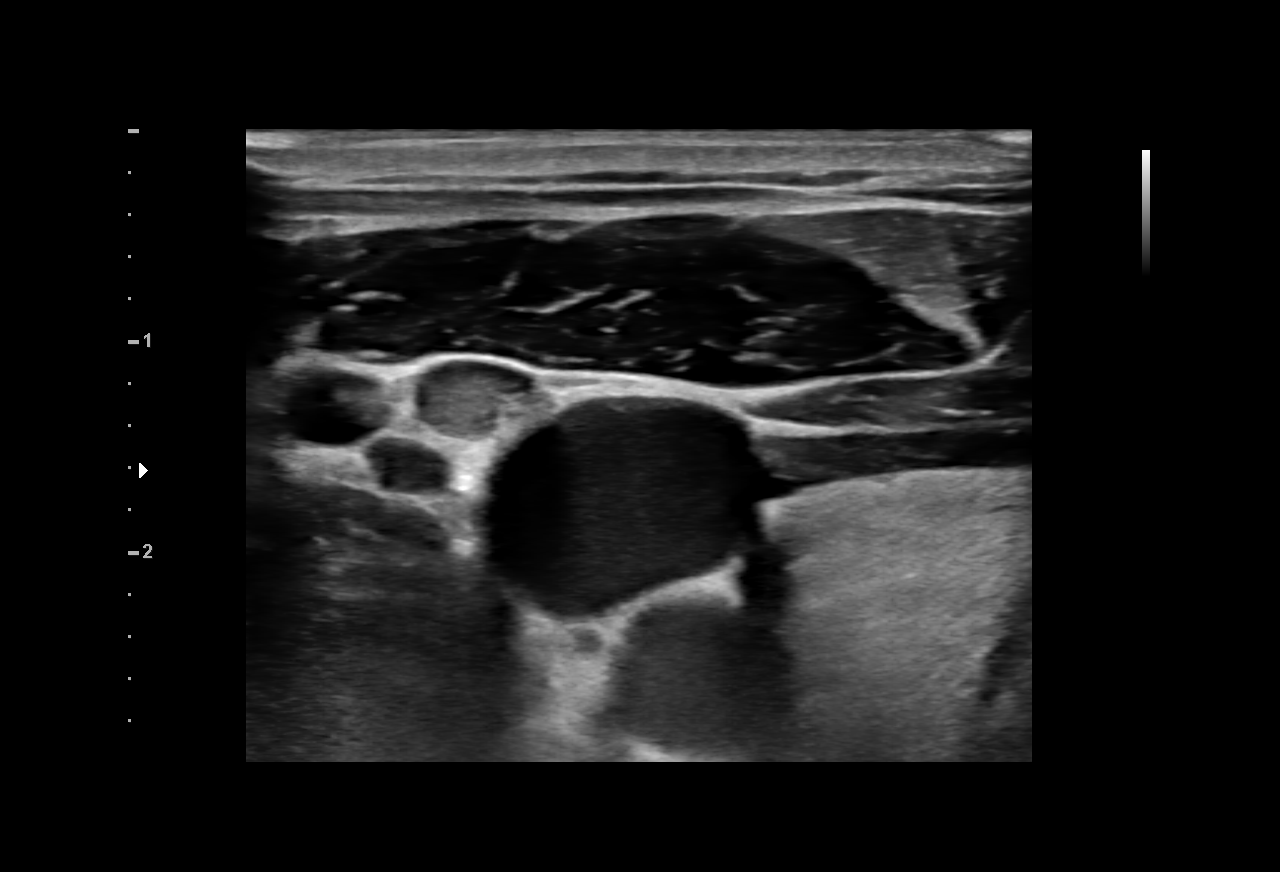

[2 of 2 positions shown; findings below may reference images not displayed]

EXAM:
NON-TUNNELED CENTRAL VENOUS CATHETER PLACEMENT WITH ULTRASOUND AND
FLUOROSCOPIC GUIDANCE

FLUOROSCOPY TIME:  12 seconds.  1.0 mGy.

PROCEDURE:
The procedure, risks, benefits, and alternatives were explained to
the patient. Questions regarding the procedure were encouraged and
answered. The patient understands and consents to the procedure. A
time-out was performed prior to initiating the procedure.

Ultrasound was utilized to confirm patency of the right internal
jugular vein. The right neck and chest were prepped with
chlorhexidine in a sterile fashion, and a sterile drape was applied
covering the operative field. Maximum barrier sterile technique with
sterile gowns and gloves were used for the procedure. Local
anesthesia was provided with 1% lidocaine.

After creating a small venotomy incision, a 19 gauge needle was
advanced into the right internal jugular vein under direct,
real-time ultrasound guidance. Ultrasound image documentation was
performed. After securing guidewire access, venous access was
dilated. A 20 cm, 12 French triple lumen Mahurkar non tunneled
catheter was then advanced over the wire.

Final catheter positioning was confirmed and documented with a
fluoroscopic spot image. The catheter was aspirated, flushed with
saline, and injected with appropriate volume heparin dwells. The
catheter exit site was secured with a 0-Prolene retention suture.

COMPLICATIONS:
None.  No pneumothorax.
FINDINGS: After catheter placement, the tip lies at the cavoatrial junction.
The catheter aspirates normally and is ready for immediate use.
IMPRESSION: Placement of non-tunneled HD/pheresis catheter via the right
internal jugular vein. The catheter tip lies at the cavoatrial
junction. The catheter is ready for immediate use.

## 2019-04-07 IMAGING — DX DG CHEST 1V PORT
1 series · 1 of 1 positions shown · non-contrast
Comparison: 10/05/2016

CLINICAL DATA: Itch in the back of throat with dry cough.

EXAM:
PORTABLE CHEST 1 VIEW

[chest]
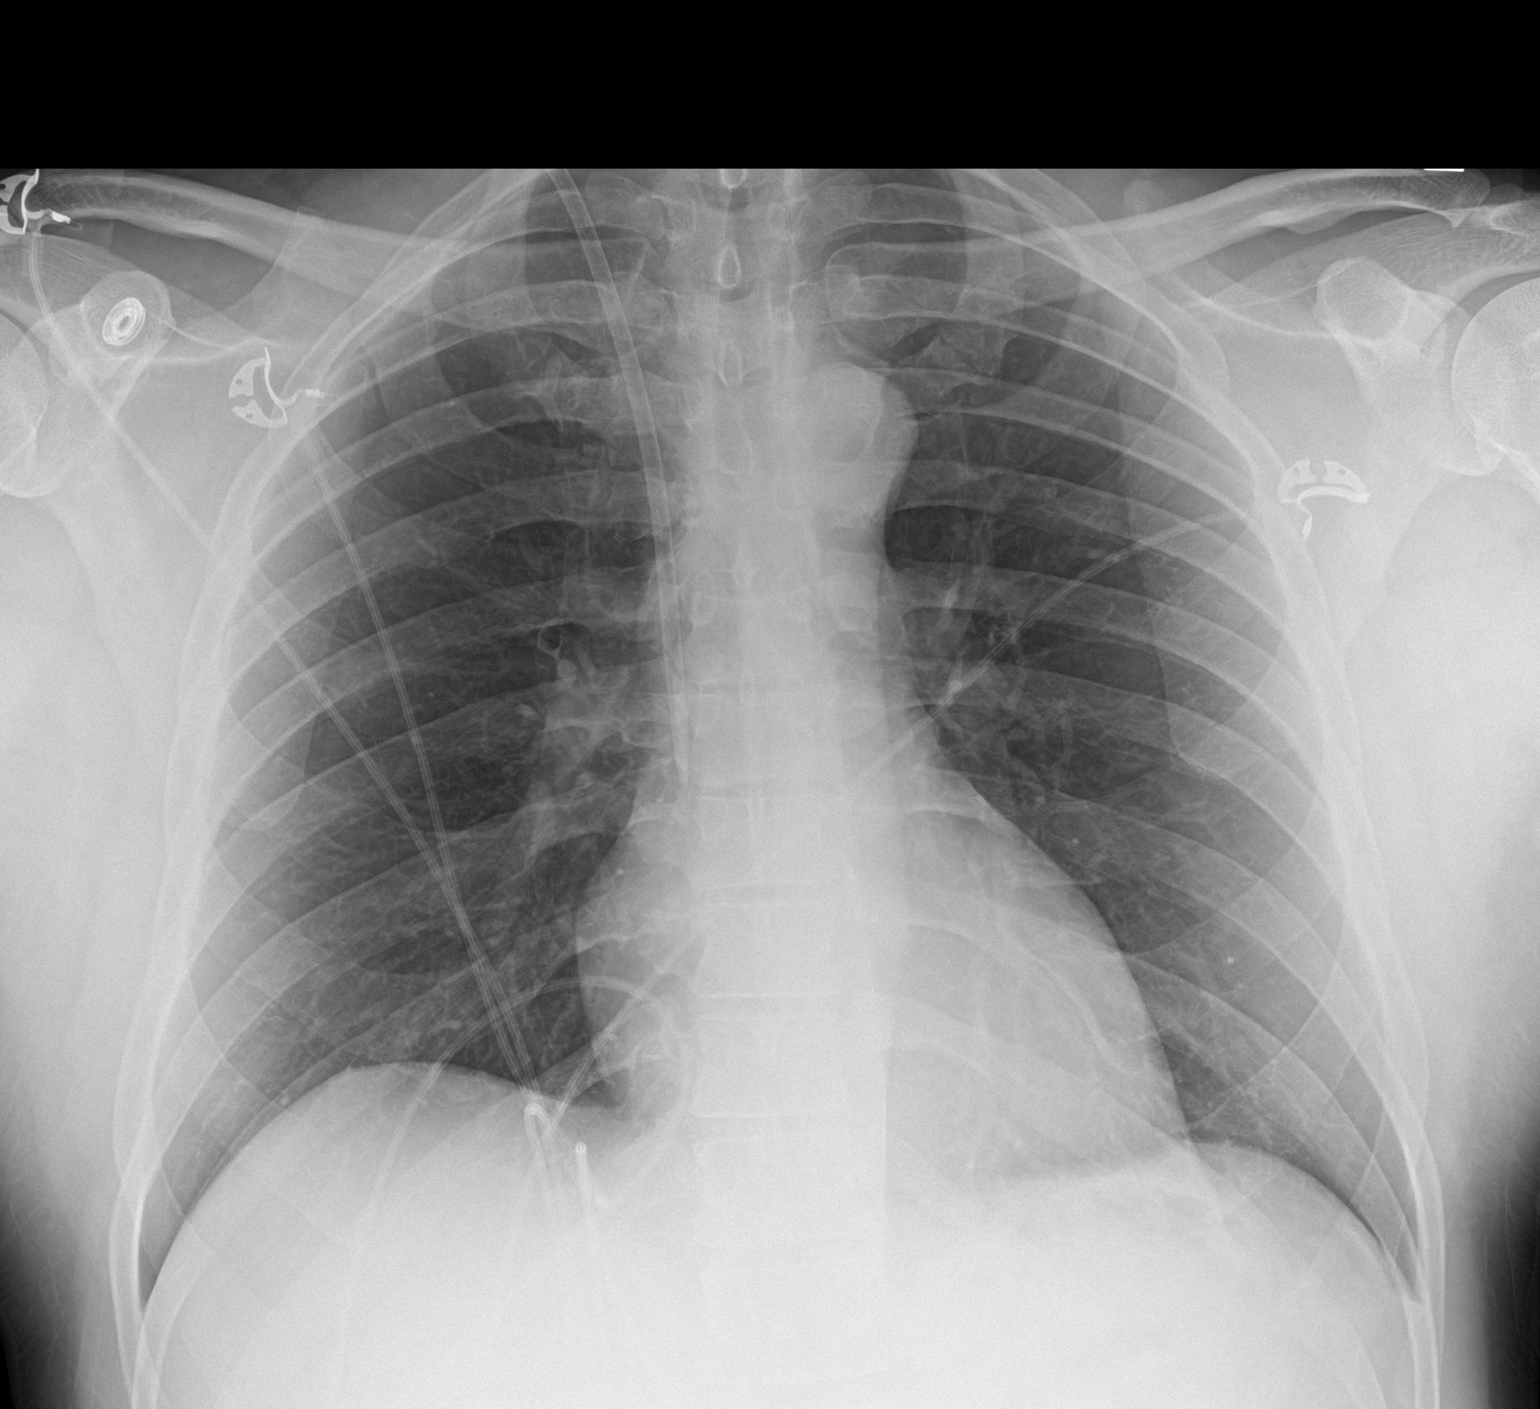

[1 of 1 positions shown; findings below may reference images not displayed]

FINDINGS: There is a dialysis catheter on the right with tip at the upper
cavoatrial junction. There is no edema, consolidation, effusion, or
pneumothorax. Normal heart size and mediastinal contours.
IMPRESSION: No evidence of active disease.

## 2019-04-19 DIAGNOSIS — Z20828 Contact with and (suspected) exposure to other viral communicable diseases: Secondary | ICD-10-CM | POA: Diagnosis not present

## 2019-04-20 DIAGNOSIS — Z20828 Contact with and (suspected) exposure to other viral communicable diseases: Secondary | ICD-10-CM | POA: Diagnosis not present

## 2019-04-25 DIAGNOSIS — Z20828 Contact with and (suspected) exposure to other viral communicable diseases: Secondary | ICD-10-CM | POA: Diagnosis not present

## 2019-04-27 ENCOUNTER — Telehealth: Payer: Self-pay | Admitting: Family Medicine

## 2019-04-27 DIAGNOSIS — Z13228 Encounter for screening for other metabolic disorders: Secondary | ICD-10-CM | POA: Diagnosis not present

## 2019-04-27 DIAGNOSIS — E785 Hyperlipidemia, unspecified: Secondary | ICD-10-CM | POA: Diagnosis not present

## 2019-04-27 DIAGNOSIS — E119 Type 2 diabetes mellitus without complications: Secondary | ICD-10-CM | POA: Diagnosis not present

## 2019-04-27 DIAGNOSIS — Z191 Hormone sensitive malignancy status: Secondary | ICD-10-CM | POA: Diagnosis not present

## 2019-04-27 NOTE — Telephone Encounter (Signed)
**  After Hours/ Emergency Line Call**  Received a call to report that Terrie Rachel "feels funny".  Patient's fiance reports he "feels funny" similar to when platelets are dropped. yesterday evening he had SOB and had shoulder aching. Had tightness in his chest. And dry cough. No fevers. Has been having a nose bleed but was able to clot, only bleeds when he blows his nose. "not much" blood. Having light headaches, checked glucose and was 164. Only happened after a COVID test. His job also closed down last Wednesday due to [place]. His test was negative but was told to get a second test. Second test is pending.   Recommended that patient be seen in either urgent care, or if unable to go to UC, then ED.  Red flags discussed.  Will forward to PCP.  Caroline More, DO PGY-3, [facility] 04/27/2019 2:52 PM

## 2019-05-11 ENCOUNTER — Telehealth: Payer: Self-pay | Admitting: *Deleted

## 2019-05-11 NOTE — Telephone Encounter (Signed)
Asking if patient should be getting COVID vaccine when available to him? Per Dr. Benay Spice: Yes

## 2019-05-12 ENCOUNTER — Ambulatory Visit (INDEPENDENT_AMBULATORY_CARE_PROVIDER_SITE_OTHER): Payer: 59 | Admitting: Family Medicine

## 2019-05-12 ENCOUNTER — Encounter: Payer: Self-pay | Admitting: Family Medicine

## 2019-05-12 ENCOUNTER — Other Ambulatory Visit: Payer: Self-pay

## 2019-05-12 VITALS — BP 128/80 | HR 71 | Ht 70.0 in | Wt 204.0 lb

## 2019-05-12 DIAGNOSIS — E1365 Other specified diabetes mellitus with hyperglycemia: Secondary | ICD-10-CM

## 2019-05-12 DIAGNOSIS — E119 Type 2 diabetes mellitus without complications: Secondary | ICD-10-CM

## 2019-05-12 NOTE — Patient Instructions (Signed)
Thank you for coming to see me today. It was a pleasure! Today we talked about:   Glad all your lab work was normal. Your diabetes is doing well! For your cough, you can try honey, 1 tbsp. With peanut butter to help avoid rasing your blood sugar too much.   Please follow-up with your primary doctor in 3 months or sooner as needed.  If you have any questions or concerns, please do not hesitate to call the office at [phone].  Take Care,   [place] Laiken Sandy, DO   Diet Recommendations for Diabetes  Carbohydrate includes starch, sugar, and fiber.  Of these, only sugar and starch raise blood glucose.  (Fiber is found in fruits, vegetables [especially skin, seeds, and stalks] and whole grains.)   Starchy (carb) foods: Bread, rice, pasta, potatoes, corn, cereal, grits, crackers, bagels, muffins, all baked goods.  (Fruit, milk, and yogurt also have carbohydrate, but most of these foods will not spike your blood sugar as most starchy foods will.)  A few fruits do cause high blood sugars; use small portions of bananas (limit to 1/2 at a time), grapes, watermelon, oranges, and most tropical fruits.   Protein foods: Meat, fish, poultry, eggs, dairy foods, and beans such as pinto and kidney beans (beans also provide carbohydrate).   1. Eat at least REAL 3 meals and 1-2 snacks per day. Never go more than 4-5 hours while awake without eating. Eat breakfast within the first hour of getting up.   2. Limit starchy foods to TWO per meal and ONE per snack. ONE portion of a starchy  food is equal to the following:   - ONE slice of bread (or its equivalent, such as half of a hamburger bun).   - 1/2 cup of a "scoopable" starchy food such as potatoes or rice.   - 15 grams of Total Carbohydrate as shown on food label.  3. Include at every meal: a protein food, a carb food, and vegetables and/or fruit.   - Obtain twice the volume of veg's as protein or carbohydrate foods for both lunch and dinner.   - Fresh or  frozen veg's are best.   - Keep frozen veg's on hand for a quick vegetable serving.

## 2019-05-12 NOTE — Progress Notes (Signed)
  Subjective:  [name]  DOB: [birth year] MRN: [id number]  Eric Hooper is a 44 y.o. male with a PMH of TTP, HTN, diabetes, GERD, here today for follow-up after urgent care visit.   HPI:  Follow-up after urgent care visit for palpitations: -Patient reports that multiple people at his work were diagnosed with Covid.  He had one negative test but was then told to go take a second test.  He states that while he was waiting for his test he believes that he got himself overly worked up and anxious regarding if he was to have Covid.  He states he went to the urgent care and had multiple lab work done and came here to have the results explained to him.  He states that his palpitations have resolved.  He denies any chest pain.  He denies any shortness of breath.  He denies any URI sx. he states that overall he is feeling quite well and does not have any complaints.  Diabetes Medications: Metformin 1000 g twice daily Compliance (Modifying factor) -yes hypoglycemic symptoms-no On statin Last foot exam: up to date ROS: denies dizziness, diaphoresis, LOC, polyuria, polydipsia Last A1c 8.0.  Repeated at urgent care and A1c 6.8 on 04/27/2019.  ROS: As mentioned in HPI  Social hx: Denies use of illicit drugs, alcohol use Smoking status reviewed  Patient Active Problem List   Diagnosis Date Noted  . PICC (peripherally inserted central catheter) in place 09/05/2018  . Chest wall pain 08/14/2018  . At risk for dysfunction of heart 07/11/2018  . Mixed hyperlipidemia 02/28/2018  . Type 2 diabetes mellitus without complications ([place]) 01/77/9390  . Phlebitis 05/12/2017  . Leg swelling 05/12/2017  . Tobacco use disorder 04/13/2017  . GERD (gastroesophageal reflux disease)   . Hypertension   . TTP (thrombotic thrombocytopenic purpura) ([place]) 02/06/2017  . Alcohol use 02/03/2017  . Renal insufficiency 02/03/2017  . Leukocytosis 02/03/2017  . Thrombocytopenia ([place]) 02/01/2017  .  Hypokalemia 02/01/2017  . Macrocytic anemia 02/01/2017  . Hyperglycemia 02/01/2017  . Microscopic hematuria 02/01/2017  . Diverticulosis 02/01/2017     Objective:  BP 128/80   Pulse 71   Ht 5\' 10"  (1.778 m)   Wt 204 lb (92.5 kg)   SpO2 97%   BMI 29.27 kg/m   Vitals and nursing note reviewed  General: NAD, pleasant Pulm: normal effort Extremities: no edema or cyanosis. WWP. Skin: warm and dry, no rashes noted Neuro: alert and oriented, no focal deficits Psych: normal affect, normal thought content  Assessment & Plan:   Type 2 diabetes mellitus without complications [facility]) Patient with recent A1c from outside hospital that can be seen in care everywhere of 6.8.  Patient should continue current therapy with Metformin.  Went over patient's lab results and will have them scanned into chart, and can be found in care everywhere. CMP, TSH, CBC with differential all grossly normal. Only elevation noted to be with total bili of 1.4 and ALT elevated to 73. However patient currently asymptomatic and not endorsing any abdominal pain, nausea or vomiting.  Patient instructed that if he experiences any sx then he is to come back in to have that number rechecked.  [place] Alyza Artiaga, DO Family Medicine Resident PGY-3

## 2019-05-13 NOTE — Assessment & Plan Note (Signed)
Patient with recent A1c from outside hospital that can be seen in care everywhere of 6.8.  Patient should continue current therapy with Metformin.

## 2019-06-09 ENCOUNTER — Telehealth (INDEPENDENT_AMBULATORY_CARE_PROVIDER_SITE_OTHER): Payer: 59 | Admitting: Family Medicine

## 2019-06-09 ENCOUNTER — Other Ambulatory Visit: Payer: Self-pay

## 2019-06-09 DIAGNOSIS — F5104 Psychophysiologic insomnia: Secondary | ICD-10-CM

## 2019-06-09 DIAGNOSIS — G4700 Insomnia, unspecified: Secondary | ICD-10-CM | POA: Insufficient documentation

## 2019-06-09 DIAGNOSIS — R69 Illness, unspecified: Secondary | ICD-10-CM | POA: Diagnosis not present

## 2019-06-09 MED ORDER — MELATONIN 3 MG PO TABS: 1.0000 | ORAL_TABLET | Freq: Every evening | ORAL | 0 refills | Status: DC | PRN

## 2019-06-09 NOTE — Assessment & Plan Note (Signed)
Chronic insomnia.  Unknown cause.  Need to rule out depression and anxiety at follow-up with PHQ 9 and GAD7.  -Recommend keeping sleep journal -Encourage sleep hygiene -Avoid things that could worsen sleep such as alcohol, caffeine, nicotine -Trial of melatonin --Follow-up in 2 weeks -Consider CBT and sleep study to rule out OSA if no improvement with lifestyle modifications

## 2019-06-09 NOTE — Progress Notes (Signed)
[facility] Visit  Patient consented to have virtual visit. Method of visit: Video was attempted, but technology challenges prevented patient from using video, so visit was conducted via telephone.  Encounter participants: Patient: Eric Hooper - located at home Provider: Bonnita Hollow - located at office Others (if applicable): None  Chief Complaint: Difficulty sleeping  HPI:  Patient presents with 1 year history of difficulty sleeping.  Has been on sleeping pills in the past but he does not member what.  Reports that he typically falls asleep around 8 and then wakes up around 4 to go to work.  Feels that he has difficulty falling asleep.  Does not endorse waking up in the middle the night.  Patient does smoke, drink alcohol occasionally, and drink caffeine during the day.  Unsure about how much she is on this from before falling asleep.  Patient denies any daytime sleepiness or needing to take naps.  Denies restless legs syndrome.  Patient has not had recent change in his job schedule.  ROS: per HPI  Pertinent PMHx:  Noncontributory  Exam:  Respiratory: Able speak in full sentences without issue  Assessment/Plan:  Insomnia Chronic insomnia.  Unknown cause.  Need to rule out depression and anxiety at follow-up with PHQ 9 and GAD7.  -Recommend keeping sleep journal -Encourage sleep hygiene -Avoid things that could worsen sleep such as alcohol, caffeine, nicotine -Trial of melatonin --Follow-up in 2 weeks -Consider CBT and sleep study to rule out OSA if no improvement with lifestyle modifications    Time spent during visit with patient: 15 minutes

## 2019-07-02 ENCOUNTER — Other Ambulatory Visit: Payer: Self-pay | Admitting: Family Medicine

## 2019-07-02 DIAGNOSIS — G4700 Insomnia, unspecified: Secondary | ICD-10-CM

## 2019-07-19 ENCOUNTER — Other Ambulatory Visit: Payer: Self-pay | Admitting: *Deleted

## 2019-07-19 DIAGNOSIS — E782 Mixed hyperlipidemia: Secondary | ICD-10-CM

## 2019-07-19 DIAGNOSIS — E1165 Type 2 diabetes mellitus with hyperglycemia: Secondary | ICD-10-CM

## 2019-07-19 MED ORDER — ATORVASTATIN CALCIUM 40 MG PO TABS: 40.0000 mg | ORAL_TABLET | Freq: Every day | ORAL | 2 refills | Status: DC

## 2019-07-19 MED ORDER — METFORMIN HCL 1000 MG PO TABS: 1000.0000 mg | ORAL_TABLET | Freq: Two times a day (BID) | ORAL | 1 refills | Status: DC

## 2019-07-20 ENCOUNTER — Other Ambulatory Visit: Payer: Self-pay | Admitting: *Deleted

## 2019-07-20 DIAGNOSIS — I10 Essential (primary) hypertension: Secondary | ICD-10-CM

## 2019-07-20 MED ORDER — AMLODIPINE BESYLATE 10 MG PO TABS: 10.0000 mg | ORAL_TABLET | Freq: Every day | ORAL | 1 refills | Status: DC

## 2019-07-21 MED ORDER — ATORVASTATIN CALCIUM 40 MG PO TABS: 40.0000 mg | ORAL_TABLET | Freq: Every day | ORAL | 2 refills | Status: DC

## 2019-07-21 MED ORDER — METFORMIN HCL 1000 MG PO TABS: 1000.0000 mg | ORAL_TABLET | Freq: Two times a day (BID) | ORAL | 1 refills | Status: DC

## 2019-07-21 NOTE — Telephone Encounter (Signed)
Resent medication to [facility].  Malaiya Paczkowski,CMA

## 2019-07-21 NOTE — Addendum Note (Signed)
Addended by: Valerie Roys on: 07/21/2019 08:27 AM   Modules accepted: Orders

## 2019-07-25 MED ORDER — AMLODIPINE BESYLATE 10 MG PO TABS: 10.0000 mg | ORAL_TABLET | Freq: Every day | ORAL | 1 refills | Status: DC

## 2019-07-25 NOTE — Addendum Note (Signed)
Addended by: Valerie Roys on: 07/25/2019 08:20 AM   Modules accepted: Orders

## 2019-07-25 NOTE — Telephone Encounter (Signed)
Medication resent to [facility] per patient request.  Eric Hooper

## 2019-07-28 ENCOUNTER — Other Ambulatory Visit: Payer: Self-pay | Admitting: Family Medicine

## 2019-07-28 DIAGNOSIS — G4700 Insomnia, unspecified: Secondary | ICD-10-CM

## 2019-08-10 DIAGNOSIS — K219 Gastro-esophageal reflux disease without esophagitis: Secondary | ICD-10-CM | POA: Insufficient documentation

## 2019-08-10 DIAGNOSIS — I10 Essential (primary) hypertension: Secondary | ICD-10-CM | POA: Insufficient documentation

## 2019-08-10 DIAGNOSIS — E1159 Type 2 diabetes mellitus with other circulatory complications: Secondary | ICD-10-CM | POA: Insufficient documentation

## 2019-08-31 ENCOUNTER — Other Ambulatory Visit: Payer: Self-pay | Admitting: Family Medicine

## 2019-08-31 DIAGNOSIS — G4700 Insomnia, unspecified: Secondary | ICD-10-CM

## 2019-09-27 ENCOUNTER — Other Ambulatory Visit: Payer: Self-pay | Admitting: Family Medicine

## 2019-09-27 DIAGNOSIS — G4700 Insomnia, unspecified: Secondary | ICD-10-CM

## 2019-10-11 ENCOUNTER — Other Ambulatory Visit: Payer: Self-pay | Admitting: *Deleted

## 2019-10-11 DIAGNOSIS — E782 Mixed hyperlipidemia: Secondary | ICD-10-CM

## 2019-10-11 MED ORDER — ATORVASTATIN CALCIUM 40 MG PO TABS: 40.0000 mg | ORAL_TABLET | Freq: Every day | ORAL | 2 refills | Status: DC

## 2019-10-12 ENCOUNTER — Other Ambulatory Visit: Payer: Self-pay | Admitting: Family Medicine

## 2019-10-12 DIAGNOSIS — G4700 Insomnia, unspecified: Secondary | ICD-10-CM

## 2019-11-23 NOTE — Progress Notes (Signed)
   SUBJECTIVE:   CHIEF COMPLAINT / HPI:   Chief Complaint  Patient presents with  . Diabetes     Eric Hooper is a 44 y.o. male here for follow up for diabetes.   Diabetes Mellitus  Eats 2-3 meals a day and tries to watch what he eats. Denies missing any doses of DM medications and takes metformin . Denies increased thirst, hunger, or frequent urination.  Shoulder Pain   Pt was seen by occupational health for neck and shoulder pain. Patient states imaging showed no breaks.  Reports injury occurred when his co-worker dropped a heavy item they were carrying together. States he right arm was pulled down with the heavy object. Injury occurred a couple months ago.  States pain radiates from neck and occasionally to his middle finger. Denies focal weakness or loss of strength in arm.  Has occasional pins and needles sensation in RUE.   GERD Takes Nexium with relief.    PERTINENT  PMH / PSH:  reviewed and updated as appropriate   OBJECTIVE:   BP (!) 130/78   Pulse 61   Ht 5\' 10"  (1.778 m)   Wt 202 lb (91.6 kg)   SpO2 98%   BMI 28.98 kg/m   GEN: pleasant age appearing male, in no acute distress  NECK: normal ROM, supple, no C-spine tenderness  CV: regular rate and rhythm, no murmurs appreciated  RESP: no increased work of breathing, clear to ascultation bilaterally ABD: Bowel sounds present. Soft, Nontender, Nondistended.  MSK: no LE edema or calf tenderness, right shoulder: good ROM of RUE, hypertrophic musculature, tender to palpation though no bony tenderness appreciated, strength 5/5 bilateral upper extremity, gross sensation intact, negative drop arm test, positive Neer's, normal flexion, extension, adduction and abduction, diabetic filament foot exam completed  SKIN: warm, dry, well perfused  NEURO: grossly normal, moves all extremities appropriately PSYCH: Normal affect, appropriate speech and behavior    ASSESSMENT/PLAN:   TTP (thrombotic thrombocytopenic purpura)  (HCC) Hx of TTP. Per pt, 2/2 to NSAIDs. Check CBC today   GERD (gastroesophageal reflux disease) Continue Nexium regimen   Renal insufficiency BMP today. Patient denies hx of this.   Type 2 diabetes mellitus without complications (HCC) Continue current regimen: Metformin. Would benefit from ARB. A1c 7.5 from 8.0 previously. Encouraged continued diet rich in vegetables and complex carbs.  Heart healthy carb modified diet. Counseled on need to continue exercising.   Statin therapy: Lipitor  ACEi/ARB: Consider starting Losartan  Foot exam: completed today  Urine microalbumine: completed today  Eye exam:  Advised to get eye exam.   Mixed hyperlipidemia Lipid Panel (Dec 2020): total cholesterol 119, HDL 45, TGs 134, LDL 52  - taking Lipitor 40 mg  - repeat today   Cervical radiculopathy Consider Gabapentin. Imaging performed at his workplace and was negative (per pt)  - Avoid NSAIDs with hx of TTP  - If renal function appropriate, consider low dose Gabapentin for neuropathic pain        Lyndee Hensen, DO PGY-2, [facility] 11/27/2019

## 2019-11-24 ENCOUNTER — Other Ambulatory Visit: Payer: Self-pay

## 2019-11-24 ENCOUNTER — Ambulatory Visit (INDEPENDENT_AMBULATORY_CARE_PROVIDER_SITE_OTHER): Payer: 59 | Admitting: Family Medicine

## 2019-11-24 ENCOUNTER — Encounter: Payer: Self-pay | Admitting: Family Medicine

## 2019-11-24 VITALS — BP 130/78 | HR 61 | Ht 70.0 in | Wt 202.0 lb

## 2019-11-24 DIAGNOSIS — N289 Disorder of kidney and ureter, unspecified: Secondary | ICD-10-CM | POA: Diagnosis not present

## 2019-11-24 DIAGNOSIS — M3119 Other thrombotic microangiopathy: Secondary | ICD-10-CM

## 2019-11-24 DIAGNOSIS — E119 Type 2 diabetes mellitus without complications: Secondary | ICD-10-CM | POA: Diagnosis not present

## 2019-11-24 DIAGNOSIS — K219 Gastro-esophageal reflux disease without esophagitis: Secondary | ICD-10-CM

## 2019-11-24 DIAGNOSIS — M5412 Radiculopathy, cervical region: Secondary | ICD-10-CM

## 2019-11-24 DIAGNOSIS — E782 Mixed hyperlipidemia: Secondary | ICD-10-CM | POA: Diagnosis not present

## 2019-11-24 DIAGNOSIS — I10 Essential (primary) hypertension: Secondary | ICD-10-CM

## 2019-11-24 DIAGNOSIS — M311 Thrombotic microangiopathy: Secondary | ICD-10-CM | POA: Diagnosis not present

## 2019-11-24 DIAGNOSIS — D696 Thrombocytopenia, unspecified: Secondary | ICD-10-CM

## 2019-11-24 LAB — POCT GLYCOSYLATED HEMOGLOBIN (HGB A1C): HbA1c, POC (controlled diabetic range): 7.5 % — AB (ref 0.0–7.0)

## 2019-11-24 LAB — POCT UA - MICROALBUMIN
Creatinine, POC: 200 mg/dL
Microalbumin Ur, POC: 150 mg/L

## 2019-11-24 NOTE — Patient Instructions (Signed)
It was great seeing you today!  Please check-out at the front desk before leaving the clinic. I'd like to see you back in 6 months but if you need to be seen earlier than that for any new issues we're happy to fit you in, just give [place] a call!  Visit Remembers: - Continue to work on your healthy eating habits and incorporating exercise into your daily life.  - Your goal is to have an BP < 120/80 - Medicine Changes: Start taking Gabapentin, will check kidney function first    Regarding lab work today:  Due to recent changes in healthcare laws, you may see the results of your imaging and laboratory studies on MyChart before your provider has had a chance to review them.  I understand that in some cases there may be results that are confusing or concerning to you. Not all laboratory results come back in the same time frame and you may be waiting for multiple results in order to interpret others.  Please give [place] 72 hours in order for your provider to thoroughly review all the results before contacting the office for clarification of your results. If everything is normal, you will get a letter in the mail or a message in My Chart. Please give [place] a call if you do not hear from [place] after 2 weeks.  Please bring all of your medications with you to each visit.    If you haven't already, sign up for My Chart to have easy access to your labs results, and communication with your primary care physician.  Feel free to call with any questions or concerns at any time, at [phone].   Take care,  Dr. Rushie [facility]

## 2019-11-25 LAB — BASIC METABOLIC PANEL
BUN/Creatinine Ratio: 13 (ref 9–20)
BUN: 11 mg/dL (ref 6–24)
CO2: 25 mmol/L (ref 20–29)
Calcium: 9.7 mg/dL (ref 8.7–10.2)
Chloride: 104 mmol/L (ref 96–106)
Creatinine, Ser: 0.82 mg/dL (ref 0.76–1.27)
GFR calc Af Amer: 123 mL/min/{1.73_m2} (ref >59)
GFR calc non Af Amer: 106 mL/min/{1.73_m2} (ref >59)
Glucose: 141 mg/dL — ABNORMAL HIGH (ref 65–99)
Potassium: 3.4 mmol/L — ABNORMAL LOW (ref 3.5–5.2)
Sodium: 144 mmol/L (ref 134–144)

## 2019-11-25 LAB — CBC WITH DIFF/PLATELET
Basophils Absolute: 0 10*3/uL (ref 0.0–0.2)
Basos: 0 %
EOS (ABSOLUTE): 0.2 10*3/uL (ref 0.0–0.4)
Eos: 2 %
Hematocrit: 45.7 % (ref 37.5–51.0)
Hemoglobin: 16.2 g/dL (ref 13.0–17.7)
Immature Grans (Abs): 0 10*3/uL (ref 0.0–0.1)
Immature Granulocytes: 0 %
Lymphocytes Absolute: 4.7 10*3/uL — ABNORMAL HIGH (ref 0.7–3.1)
Lymphs: 50 %
MCH: 32.6 pg (ref 26.6–33.0)
MCHC: 35.4 g/dL (ref 31.5–35.7)
MCV: 92 fL (ref 79–97)
Monocytes Absolute: 0.7 10*3/uL (ref 0.1–0.9)
Monocytes: 8 %
Neutrophils Absolute: 3.9 10*3/uL (ref 1.4–7.0)
Neutrophils: 40 %
Platelets: 203 10*3/uL (ref 150–450)
RBC: 4.97 x10E6/uL (ref 4.14–5.80)
RDW: 13.2 % (ref 11.6–15.4)
WBC: 9.6 10*3/uL (ref 3.4–10.8)

## 2019-11-25 LAB — LIPID PANEL
Chol/HDL Ratio: 2.5 ratio (ref 0.0–5.0)
Cholesterol, Total: 106 mg/dL (ref 100–199)
HDL: 42 mg/dL (ref >39)
LDL Chol Calc (NIH): 45 mg/dL (ref 0–99)
Triglycerides: 102 mg/dL (ref 0–149)
VLDL Cholesterol Cal: 19 mg/dL (ref 5–40)

## 2019-11-27 ENCOUNTER — Telehealth: Payer: Self-pay

## 2019-11-27 ENCOUNTER — Encounter: Payer: Self-pay | Admitting: Family Medicine

## 2019-11-27 DIAGNOSIS — B353 Tinea pedis: Secondary | ICD-10-CM

## 2019-11-27 DIAGNOSIS — M5412 Radiculopathy, cervical region: Secondary | ICD-10-CM | POA: Insufficient documentation

## 2019-11-27 DIAGNOSIS — G8929 Other chronic pain: Secondary | ICD-10-CM | POA: Insufficient documentation

## 2019-11-27 MED ORDER — AMLODIPINE BESYLATE 5 MG PO TABS: 5.0000 mg | ORAL_TABLET | Freq: Every day | ORAL | 3 refills | Status: DC

## 2019-11-27 MED ORDER — GABAPENTIN 100 MG PO CAPS: 100.0000 mg | ORAL_CAPSULE | Freq: Three times a day (TID) | ORAL | 3 refills | Status: DC

## 2019-11-27 MED ORDER — LOSARTAN POTASSIUM 25 MG PO TABS: 25.0000 mg | ORAL_TABLET | Freq: Every day | ORAL | 2 refills | Status: DC

## 2019-11-27 MED ORDER — KETOCONAZOLE 2 % EX CREA: 1.0000 "application " | TOPICAL_CREAM | Freq: Every day | CUTANEOUS | 0 refills | Status: DC

## 2019-11-27 NOTE — Assessment & Plan Note (Addendum)
Hx of TTP. Per pt, 2/2 to NSAIDs. Check CBC today

## 2019-11-27 NOTE — Telephone Encounter (Signed)
Patients fiance calls nurse line requesting a medication for patients fungus on his feet. Verdis Frederickson reports she thought she was going to call him in something for that. However, if they need to obtain OTC that's fine as well, just let them know what PCP recommends. Please advise.

## 2019-11-27 NOTE — Assessment & Plan Note (Signed)
Continue current regimen: Metformin. Would benefit from ARB. A1c 7.5 from 8.0 previously. Encouraged continued diet rich in vegetables and complex carbs.  Heart healthy carb modified diet. Counseled on need to continue exercising.   Statin therapy: Lipitor  ACEi/ARB: Consider starting Losartan  Foot exam: completed today  Urine microalbumine: completed today  Eye exam:  Advised to get eye exam.

## 2019-11-27 NOTE — Assessment & Plan Note (Signed)
Continue Nexium regimen

## 2019-11-27 NOTE — Assessment & Plan Note (Signed)
Consider Gabapentin. Imaging performed at his workplace and was negative (per pt)  - Avoid NSAIDs with hx of TTP  - If renal function appropriate, consider low dose Gabapentin for neuropathic pain

## 2019-11-27 NOTE — Assessment & Plan Note (Signed)
BMP today. Patient denies hx of this.

## 2019-11-27 NOTE — Assessment & Plan Note (Signed)
Lipid Panel (Dec 2020): total cholesterol 119, HDL 45, TGs 134, LDL 52  - taking Lipitor 40 mg  - repeat today

## 2019-12-12 ENCOUNTER — Telehealth: Payer: Self-pay

## 2019-12-12 DIAGNOSIS — M5412 Radiculopathy, cervical region: Secondary | ICD-10-CM

## 2019-12-12 MED ORDER — GABAPENTIN 300 MG PO CAPS: 300.0000 mg | ORAL_CAPSULE | Freq: Every day | ORAL | 0 refills | Status: DC

## 2019-12-12 NOTE — Telephone Encounter (Signed)
Patient's fiance, Verdis Frederickson, returns call to nurse line as patient is at work. Informed of below. Fiance also asking about receiving antidiarrheal. Reports that patient had previous rx for "green pill" that helped with diarrhea, possibly loperamide.   Please advise if medication can be sent into pharmacy.   To PCP  Talbot Grumbling, RN

## 2019-12-12 NOTE — Telephone Encounter (Signed)
Patient calls nurse line stating (1) Gabapentin 100mg  at bedtime is not working. Patient reports he can not take during the day due to drowsiness. Patient is requesting an increase in tabs at bedtime or switching to something he can take during the day. Please advise.

## 2019-12-14 NOTE — Telephone Encounter (Signed)
Patients finance has been informed and they will obtain OTC.

## 2020-02-08 DIAGNOSIS — Z23 Encounter for immunization: Secondary | ICD-10-CM | POA: Diagnosis not present

## 2020-02-22 ENCOUNTER — Other Ambulatory Visit: Payer: Self-pay

## 2020-02-22 DIAGNOSIS — E782 Mixed hyperlipidemia: Secondary | ICD-10-CM

## 2020-02-22 MED ORDER — ATORVASTATIN CALCIUM 40 MG PO TABS: 40.0000 mg | ORAL_TABLET | Freq: Every day | ORAL | 2 refills | Status: DC

## 2020-02-27 ENCOUNTER — Other Ambulatory Visit: Payer: Self-pay | Admitting: Family Medicine

## 2020-02-27 DIAGNOSIS — E1165 Type 2 diabetes mellitus with hyperglycemia: Secondary | ICD-10-CM

## 2020-03-05 ENCOUNTER — Ambulatory Visit (INDEPENDENT_AMBULATORY_CARE_PROVIDER_SITE_OTHER): Payer: 59 | Admitting: Family Medicine

## 2020-03-05 ENCOUNTER — Other Ambulatory Visit: Payer: Self-pay

## 2020-03-05 ENCOUNTER — Encounter: Payer: Self-pay | Admitting: Family Medicine

## 2020-03-05 VITALS — BP 132/88 | HR 78 | Ht 70.0 in | Wt 203.0 lb

## 2020-03-05 DIAGNOSIS — J302 Other seasonal allergic rhinitis: Secondary | ICD-10-CM | POA: Diagnosis not present

## 2020-03-05 DIAGNOSIS — I10 Essential (primary) hypertension: Secondary | ICD-10-CM | POA: Diagnosis not present

## 2020-03-05 DIAGNOSIS — M5412 Radiculopathy, cervical region: Secondary | ICD-10-CM

## 2020-03-05 DIAGNOSIS — E119 Type 2 diabetes mellitus without complications: Secondary | ICD-10-CM

## 2020-03-05 LAB — POCT GLYCOSYLATED HEMOGLOBIN (HGB A1C): Hemoglobin A1C: 8.3 % — AB (ref 4.0–5.6)

## 2020-03-05 MED ORDER — XHANCE 93 MCG/ACT NA EXHU: 2.0000 | INHALANT_SUSPENSION | Freq: Every day | NASAL | 0 refills | Status: DC

## 2020-03-05 MED ORDER — LOSARTAN POTASSIUM 25 MG PO TABS: 25.0000 mg | ORAL_TABLET | Freq: Every day | ORAL | 2 refills | Status: DC

## 2020-03-05 NOTE — Assessment & Plan Note (Signed)
Well-controlled, continue current regimen 

## 2020-03-05 NOTE — Assessment & Plan Note (Signed)
Chronic left shoulder pain also affecting the elbow and third digit, consider cervical radiculopathy, OA, rotator cuff dysfunction.  Patient is still lifting heavy objects at work, unable to perform lighter duties.  Avoiding NSAIDs due to history of TTP. - continue gabapentin - PT referral

## 2020-03-05 NOTE — Assessment & Plan Note (Signed)
Not-controlled, A1c 8.3 up from 7.5 three months ago. On ARB and statin. - increase metformin back to 1000 mg BID - discussed diet and exercise, will try to eat more salads and less carbs

## 2020-03-05 NOTE — Patient Instructions (Addendum)
It was nice seeing you today, Eric Hooper.  Today, we talked about shoulder pain, diabetes, and allergies.  I placed a referral for physical therapy.  For your diabetes, please start taking the Metformin 1000 mg twice a day again.  In the meantime, work on improving your diet and eating more salads as we discussed.  For your allergies, I have prescribed a stronger nasal spray for you called XHANCE.  If this is not covered well by insurance, I would still recommend taking Flonase.  In the meantime, I also recommend taking another oral allergy medication such as Zyrtec or Claritin.  Please follow-up in 1 month with myself or your PCP, [name] to discuss allergist referral.  Stay well, Eric Button, MD    Shoulder Exercises Ask your health care provider which exercises are safe for you. Do exercises exactly as told by your health care provider and adjust them as directed. It is normal to feel mild stretching, pulling, tightness, or discomfort as you do these exercises. Stop right away if you feel sudden pain or your pain gets worse. Do not begin these exercises until told by your health care provider. Stretching exercises External rotation and abduction This exercise is sometimes called corner stretch. This exercise rotates your arm outward (external rotation) and moves your arm out from your body (abduction). 1. Stand in a doorway with one of your feet slightly in front of the other. This is called a staggered stance. If you cannot reach your forearms to the door frame, stand facing a corner of a room. 2. Choose one of the following positions as told by your health care provider: ? Place your hands and forearms on the door frame above your head. ? Place your hands and forearms on the door frame at the height of your head. ? Place your hands on the door frame at the height of your elbows. 3. Slowly move your weight onto your front foot until you feel a stretch across your chest and in the front  of your shoulders. Keep your head and chest upright and keep your abdominal muscles tight. 4. Hold for __________ seconds. 5. To release the stretch, shift your weight to your back foot. Repeat __________ times. Complete this exercise __________ times a day. Extension, standing 1. Stand and hold a broomstick, a cane, or a similar object behind your back. ? Your hands should be a little wider than shoulder width apart. ? Your palms should face away from your back. 2. Keeping your elbows straight and your shoulder muscles relaxed, move the stick away from your body until you feel a stretch in your shoulders (extension). ? Avoid shrugging your shoulders while you move the stick. Keep your shoulder blades tucked down toward the middle of your back. 3. Hold for __________ seconds. 4. Slowly return to the starting position. Repeat __________ times. Complete this exercise __________ times a day. Range-of-motion exercises Pendulum  1. Stand near a wall or a surface that you can hold onto for balance. 2. Bend at the waist and let your left / right arm hang straight down. Use your other arm to support you. Keep your back straight and do not lock your knees. 3. Relax your left / right arm and shoulder muscles, and move your hips and your trunk so your left / right arm swings freely. Your arm should swing because of the motion of your body, not because you are using your arm or shoulder muscles. 4. Keep moving your hips and trunk  so your arm swings in the following directions, as told by your health care provider: ? Side to side. ? Forward and backward. ? In clockwise and counterclockwise circles. 5. Continue each motion for __________ seconds, or for as long as told by your health care provider. 6. Slowly return to the starting position. Repeat __________ times. Complete this exercise __________ times a day. Shoulder flexion, standing  1. Stand and hold a broomstick, a cane, or a similar object.  Place your hands a little more than shoulder width apart on the object. Your left / right hand should be palm up, and your other hand should be palm down. 2. Keep your elbow straight and your shoulder muscles relaxed. Push the stick up with your healthy arm to raise your left / right arm in front of your body, and then over your head until you feel a stretch in your shoulder (flexion). ? Avoid shrugging your shoulder while you raise your arm. Keep your shoulder blade tucked down toward the middle of your back. 3. Hold for __________ seconds. 4. Slowly return to the starting position. Repeat __________ times. Complete this exercise __________ times a day. Shoulder abduction, standing 1. Stand and hold a broomstick, a cane, or a similar object. Place your hands a little more than shoulder width apart on the object. Your left / right hand should be palm up, and your other hand should be palm down. 2. Keep your elbow straight and your shoulder muscles relaxed. Push the object across your body toward your left / right side. Raise your left / right arm to the side of your body (abduction) until you feel a stretch in your shoulder. ? Do not raise your arm above shoulder height unless your health care provider tells you to do that. ? If directed, raise your arm over your head. ? Avoid shrugging your shoulder while you raise your arm. Keep your shoulder blade tucked down toward the middle of your back. 3. Hold for __________ seconds. 4. Slowly return to the starting position. Repeat __________ times. Complete this exercise __________ times a day. Internal rotation  1. Place your left / right hand behind your back, palm up. 2. Use your other hand to dangle an exercise band, a towel, or a similar object over your shoulder. Grasp the band with your left / right hand so you are holding on to both ends. 3. Gently pull up on the band until you feel a stretch in the front of your left / right shoulder. The  movement of your arm toward the center of your body is called internal rotation. ? Avoid shrugging your shoulder while you raise your arm. Keep your shoulder blade tucked down toward the middle of your back. 4. Hold for __________ seconds. 5. Release the stretch by letting go of the band and lowering your hands. Repeat __________ times. Complete this exercise __________ times a day. Strengthening exercises External rotation  1. Sit in a stable chair without armrests. 2. Secure an exercise band to a stable object at elbow height on your left / right side. 3. Place a soft object, such as a folded towel or a small pillow, between your left / right upper arm and your body to move your elbow about 4 inches (10 cm) away from your side. 4. Hold the end of the exercise band so it is tight and there is no slack. 5. Keeping your elbow pressed against the soft object, slowly move your forearm out, away from your  abdomen (external rotation). Keep your body steady so only your forearm moves. 6. Hold for __________ seconds. 7. Slowly return to the starting position. Repeat __________ times. Complete this exercise __________ times a day. Shoulder abduction  1. Sit in a stable chair without armrests, or stand up. 2. Hold a __________ weight in your left / right hand, or hold an exercise band with both hands. 3. Start with your arms straight down and your left / right palm facing in, toward your body. 4. Slowly lift your left / right hand out to your side (abduction). Do not lift your hand above shoulder height unless your health care provider tells you that this is safe. ? Keep your arms straight. ? Avoid shrugging your shoulder while you do this movement. Keep your shoulder blade tucked down toward the middle of your back. 5. Hold for __________ seconds. 6. Slowly lower your arm, and return to the starting position. Repeat __________ times. Complete this exercise __________ times a day. Shoulder  extension 1. Sit in a stable chair without armrests, or stand up. 2. Secure an exercise band to a stable object in front of you so it is at shoulder height. 3. Hold one end of the exercise band in each hand. Your palms should face each other. 4. Straighten your elbows and lift your hands up to shoulder height. 5. Step back, away from the secured end of the exercise band, until the band is tight and there is no slack. 6. Squeeze your shoulder blades together as you pull your hands down to the sides of your thighs (extension). Stop when your hands are straight down by your sides. Do not let your hands go behind your body. 7. Hold for __________ seconds. 8. Slowly return to the starting position. Repeat __________ times. Complete this exercise __________ times a day. Shoulder row 1. Sit in a stable chair without armrests, or stand up. 2. Secure an exercise band to a stable object in front of you so it is at waist height. 3. Hold one end of the exercise band in each hand. Position your palms so that your thumbs are facing the ceiling (neutral position). 4. Bend each of your elbows to a 90-degree angle (right angle) and keep your upper arms at your sides. 5. Step back until the band is tight and there is no slack. 6. Slowly pull your elbows back behind you. 7. Hold for __________ seconds. 8. Slowly return to the starting position. Repeat __________ times. Complete this exercise __________ times a day. Shoulder press-ups  1. Sit in a stable chair that has armrests. Sit upright, with your feet flat on the floor. 2. Put your hands on the armrests so your elbows are bent and your fingers are pointing forward. Your hands should be about even with the sides of your body. 3. Push down on the armrests and use your arms to lift yourself off the chair. Straighten your elbows and lift yourself up as much as you comfortably can. ? Move your shoulder blades down, and avoid letting your shoulders move up  toward your ears. ? Keep your feet on the ground. As you get stronger, your feet should support less of your body weight as you lift yourself up. 4. Hold for __________ seconds. 5. Slowly lower yourself back into the chair. Repeat __________ times. Complete this exercise __________ times a day. Wall push-ups  1. Stand so you are facing a stable wall. Your feet should be about one arm-length away from the  wall. 2. Lean forward and place your palms on the wall at shoulder height. 3. Keep your feet flat on the floor as you bend your elbows and lean forward toward the wall. 4. Hold for __________ seconds. 5. Straighten your elbows to push yourself back to the starting position. Repeat __________ times. Complete this exercise __________ times a day. This information is not intended to replace advice given to you by your health care provider. Make sure you discuss any questions you have with your health care provider. Document Revised: 08/05/2018 Document Reviewed: 05/13/2018 Elsevier Patient Education  [place].

## 2020-03-05 NOTE — Progress Notes (Signed)
    SUBJECTIVE:   CHIEF COMPLAINT / HPI:   T2DM Previously was taking metformin 1000 mg BID, states he was told to decrease it to once a day. Diet does include a lot of bread, pasta, and carbs.  No sugar sweetened drinks. Does not want to add on a second diabetes agent.  Allergic rhinitis Reports symptoms of coughing, sneezing, congestion, rhinorrhea. Mostly during fall and spring. Previously was seen an allergist in [place] and was receiving allergy shots, patient requesting a referral to allergist. Not currently taking any allergy medications, states they are ineffective.  Left shoulder pain Patient has chronic left shoulder pain due to an injury at work several months ago during which his coworker dropped a heavy item they were carrying together, and his left arm was pulled out with a heavy object. He reports pain in his left shoulder, left elbow, and left third digit.  Does report shooting pain. He states he had an x-ray of his left shoulder at his work place, which was negative for fracture and he was told he had some arthritis. Has been taking gabapentin, helps him sleep but does not feel like it is helping with the pain. Asking about physical therapy. This was a workers comp case, they are no longer following.  PERTINENT  PMH / PSH: T2DM, HTN, HLD, GERD, tobacco use, TTP  OBJECTIVE:   BP 132/88   Pulse 78   Ht 5\' 10"  (1.778 m)   Wt 203 lb (92.1 kg)   SpO2 98%   BMI 29.13 kg/m   General: Overweight male, NAD Neck: no midline or paraspinal tenderness CV: RRR, no murmurs Pulm: CTAB, no wheezes or rales MSK: tenderness to left shoulder joint, left elbow, and left 3rd digit; left shoulder ROM limited by pain particularly abduction Neuro: 5/5 strength bilateral upper extremities  ASSESSMENT/PLAN:   Type 2 diabetes mellitus without complications (HCC) Not-controlled, A1c 8.3 up from 7.5 three months ago. On ARB and statin. - increase metformin back to 1000 mg BID -  discussed diet and exercise, will try to eat more salads and less carbs  Hypertension Well-controlled, continue current regimen.  Cervical radiculopathy Chronic left shoulder pain also affecting the elbow and third digit, consider cervical radiculopathy, OA, rotator cuff dysfunction.  Patient is still lifting heavy objects at work, unable to perform lighter duties.  Avoiding NSAIDs due to history of TTP. - continue gabapentin - PT referral  Seasonal allergic rhinitis Symptoms of cough, congestion, rhinorrhea, sneezing with seasonal component.  Previously was followed by allergist in [place] and was receiving allergy shots.  Not currently taking any allergy medication because he states they are ineffective.  Patient willing to try medications for 1 month before considering allergy referral. - rx Truett Perna, instructed patient to take Flonase if insurance does not cover XHANCE - OTC second-generation antihistamine - f/u 1 month, consider allergy referral if no improvement on medications     [name], MD [place]

## 2020-03-05 NOTE — Assessment & Plan Note (Signed)
Symptoms of cough, congestion, rhinorrhea, sneezing with seasonal component.  Previously was followed by allergist in [place] and was receiving allergy shots.  Not currently taking any allergy medication because he states they are ineffective.  Patient willing to try medications for 1 month before considering allergy referral. - rx Truett Perna, instructed patient to take Flonase if insurance does not cover XHANCE - OTC second-generation antihistamine - f/u 1 month, consider allergy referral if no improvement on medications

## 2020-03-08 ENCOUNTER — Ambulatory Visit: Payer: 59 | Admitting: Family Medicine

## 2020-04-05 ENCOUNTER — Other Ambulatory Visit: Payer: Self-pay

## 2020-04-05 ENCOUNTER — Ambulatory Visit: Payer: No Typology Code available for payment source | Attending: Family Medicine

## 2020-04-05 DIAGNOSIS — G8929 Other chronic pain: Secondary | ICD-10-CM | POA: Diagnosis present

## 2020-04-05 DIAGNOSIS — M25512 Pain in left shoulder: Secondary | ICD-10-CM | POA: Diagnosis not present

## 2020-04-05 DIAGNOSIS — M25522 Pain in left elbow: Secondary | ICD-10-CM | POA: Diagnosis present

## 2020-04-05 DIAGNOSIS — M79645 Pain in left finger(s): Secondary | ICD-10-CM

## 2020-04-05 DIAGNOSIS — M6281 Muscle weakness (generalized): Secondary | ICD-10-CM | POA: Diagnosis present

## 2020-04-05 DIAGNOSIS — R293 Abnormal posture: Secondary | ICD-10-CM

## 2020-04-05 NOTE — Therapy (Addendum)
[place], [place], [address] Phone: [phone]   Fax:  [phone]  Physical Therapy Evaluation  Patient Details  Name: Eric Hooper MRN: [id number] Date of Birth: [birth year] Referring Provider (PT): Dorris Singh, MD   Encounter Date: 04/05/2020   PT End of Session - 04/05/20 1422    Visit Number 1    Number of Visits 16    Date for PT Re-Evaluation 05/31/20    Authorization Type [employer]    PT Start Time 1414   pt arrived late   PT Stop Time 1450    PT Time Calculation (min) 36 min    Activity Tolerance Patient tolerated treatment well    Behavior During Therapy [facility] for tasks assessed/performed           Past Medical History:  Diagnosis Date  . Controlled diabetes mellitus type 2 with complications ([place]) 19/10/5881  . Diabetes mellitus without complication (HCC)    non-insulin dependent  . Diverticulosis 02/01/2017   on CT scan abd/pelvis  . GERD (gastroesophageal reflux disease)   . Hypertension   . Kidney stones 02/03/2017  . Mixed hyperlipidemia 02/28/2018  . Smoker 04/13/2017    Past Surgical History:  Procedure Laterality Date  . ELBOW SURGERY Right   . IR FLUORO GUIDE CV LINE RIGHT  02/02/2017  . IR US GUIDE VASC ACCESS RIGHT  02/02/2017    There were no vitals filed for this visit.    Subjective Assessment - 04/05/20 1424    Subjective Pt reports L UE pain that started 8-9 months ago. He says it started as L elbow pain and went to upper traps then down arm to 3rd digit. He has not had any neck pain, just muscle soreness in UT. Pain increases with activity and sleeping on L side.    Pertinent History R elbow sx 10 years ago    Limitations Lifting;House hold activities    Diagnostic tests None    Patient Stated Goals to reduce pain    Currently in Pain? Yes    Pain Score 5     Pain Location Shoulder    Pain Orientation Left;Upper    Pain Descriptors / Indicators Aching;Sore     Pain Type Chronic pain    Pain Radiating Towards elbow and 3rd digit    Pain Onset More than a month ago    Pain Frequency Constant    Aggravating Factors  sleeping on L side, using your left arm, cold weather    Pain Relieving Factors heat from shower    Effect of Pain on Daily Activities able to work, but hurts              [facility] PT Assessment - 04/05/20 0001      Assessment   Medical Diagnosis Cervical Radiculopathy    Referring Provider (PT) Dorris Singh, MD    Onset Date/Surgical Date --   8-9 mo ago   Hand Dominance Right    Next MD Visit unknown    Prior Therapy 10 years ago for R elbow surgery      Precautions   Precautions None      Restrictions   Weight Bearing Restrictions No      Balance Screen   Has the patient fallen in the past 6 months No      Prior Function   Level of Independence Independent    Vocation Full time employment    Vocation Requirements lifting for deliveries  Leisure unknown      Observation/Other Assessments   Focus on Therapeutic Outcomes (FOTO)  39% ability      ROM / Strength   AROM / PROM / Strength Strength;AROM      AROM   AROM Assessment Site Cervical;Shoulder    Right/Left Shoulder Right;Left    Right Shoulder Flexion 140 Degrees    Right Shoulder ABduction 145 Degrees    Left Shoulder Flexion 145 Degrees    Left Shoulder ABduction 133 Degrees    Cervical Flexion 34    Cervical Extension 25    Cervical - Right Side Bend 25   L UT pull   Cervical - Left Side Bend 40    Cervical - Right Rotation 69    Cervical - Left Rotation 65      Strength   Strength Assessment Site Hand;Shoulder;Elbow    Right/Left Shoulder Right;Left    Right Shoulder Flexion 4+/5    Right Shoulder ABduction 5/5    Right Shoulder Internal Rotation 4/5    Right Shoulder External Rotation 4/5    Left Shoulder Flexion 5/5    Left Shoulder ABduction 4/5    Left Shoulder Internal Rotation 4/5    Left Shoulder External Rotation 4-/5     Right/Left Elbow Right;Left    Right Elbow Flexion 4+/5   pain   Right Elbow Extension 4/5   pain   Left Elbow Flexion 4+/5    Left Elbow Extension 4+/5    Right/Left hand Right;Left    Right Hand Grip (lbs) 95 lbs, 103 lbs, 105 lbs   101 lbs average   Left Hand Grip (lbs) 75 lbs, 80 lbs, 78 lbs   77 lbs average     Palpation   Patella mobility TTP lateral epicondlye, ECRB/L, L UT    Palpation comment (+) cozen's and Maudsley L      Special Tests    Special Tests Cervical    Cervical Tests Spurling's;Dictraction;other      Spurling's   Findings Negative    Comment both sides      Distraction Test   Findngs Negative    Comment both      other    Comment ULTT (-)                      Objective measurements completed on examination: See above findings.               PT Education - 04/05/20 1503    Education Details Likely diagnosis, prognosis, posture, POC, HEP    Person(s) Educated Patient    Methods Explanation;Demonstration;Tactile cues;Verbal cues;Handout    Comprehension Verbalized understanding;Returned demonstration;Verbal cues required;Tactile cues required            PT Short Term Goals - 04/05/20 1513      PT SHORT TERM GOAL #1   Title Pt will be I and complaint with initial HEP.    Time 2    Period Weeks    Status New    Target Date 04/19/20      PT SHORT TERM GOAL #2   Title Pt will report pain decrease from constant to intermittent.    Time 3    Period Weeks    Status New    Target Date 04/26/20             PT Long Term Goals - 04/05/20 1519      PT LONG TERM GOAL #1   Title  Pt will be independent with longterm HEP for maintenance of posture and body mechanics, as needed for work and IADLs.    Time 8    Period Weeks    Status New    Target Date 05/31/20      PT LONG TERM GOAL #2   Title Pt will increase FOTO ability to 68% ability, indicating significant improvement in perceived functional ability.    Baseline  39% ability    Time 8    Period Weeks    Status New    Target Date 05/31/20      PT LONG TERM GOAL #3   Title Pt will decrease pain to </= 2/10 at worst.    Baseline constant pain    Time 8    Period Weeks    Status New    Target Date 05/31/20      PT LONG TERM GOAL #4   Title Pt will increase L shoulder ER strength to at least 4+/5 for improved stability of L UE.    Baseline 4-/5    Time 8    Period Weeks    Status New    Target Date 05/31/20      PT LONG TERM GOAL #5   Title Pt will increase L elbow flexor/extensor MMT to 5/5 without pain.    Time 8    Period Weeks    Status New    Target Date 05/31/20                  Plan - 04/05/20 1423    Clinical Impression Statement Pt is a 44 yo male who presents with 8-9 mo history of L UE pain, beginning in L lateral elbow and slowly migrating to left upper traps and 3rd digit. Pt denies NT and is negative for all cervical radiculopathy testing. Cervical ROM is WFL and does not provocate pain. L upper traps are TTP, along with lateral epicondyle and ECRB/L. Pt is positive for Cozen's, Maudsley's and Mill's testing in L elbow, strongly indicating presence of lateral epicondylalgia. He demonstrates poor posture with mild forward head and moderate foward rounding of shoulders, as well as weakness in L shoulder external rotators, elbow flexors/extensors, and L hand grip. He was educated on diagnosis, prognosis, POC, HEP, and FOTO verbalizing understanding and consent to tx. He will benefit from skilled PT 1x/week for 6-8 weeks to address impairments.    Personal Factors and Comorbidities Comorbidity 3+;Past/Current Experience;Fitness;Time since onset of injury/illness/exacerbation    Comorbidities HLD, HTN, CKD, DM II    Examination-Activity Limitations Lift;Carry;Sleep    Examination-Participation Restrictions Occupation;Community Activity    Stability/Clinical Decision Making Stable/Uncomplicated    Clinical Decision Making Low     Rehab Potential Good    PT Frequency 1x / week    PT Duration --   6-8 weeks   PT Treatment/Interventions ADLs/Self Care Home Management;Electrical Stimulation;Dry needling;Joint Manipulations;Taping;Passive range of motion;Vasopneumatic Device;Manual techniques;Compression bandaging;Therapeutic exercise;Patient/family education;Neuromuscular re-education;Therapeutic activities;Moist Heat;Iontophoresis 42m/ml Dexamethasone    PT Next Visit Plan Assess response to HEP and update PRN, continue to assess elbow and wrist ROM as needed, progress postural alignment, UE flexibility, shoulder ER strength, grip strength    PT Home Exercise Plan [id number] wrist ext/flex stretch, scap retract, UT stretch    Consulted and Agree with Plan of Care Patient           Patient will benefit from skilled therapeutic intervention in order to improve the following deficits and impairments:  Increased fascial restricitons,Impaired  flexibility,Postural dysfunction,Pain,Impaired perceived functional ability,Decreased range of motion,Decreased strength  Visit Diagnosis: Chronic left shoulder pain - Plan: PT plan of care cert/re-cert  Pain in left elbow - Plan: PT plan of care cert/re-cert  Pain in left finger(s) - Plan: PT plan of care cert/re-cert  Muscle weakness (generalized) - Plan: PT plan of care cert/re-cert  Abnormal posture - Plan: PT plan of care cert/re-cert     Problem List Patient Active Problem List   Diagnosis Date Noted  . Seasonal allergic rhinitis 03/05/2020  . Cervical radiculopathy 11/27/2019  . Insomnia 06/09/2019  . PICC (peripherally inserted central catheter) in place 09/05/2018  . Chest wall pain 08/14/2018  . At risk for dysfunction of heart 07/11/2018  . Mixed hyperlipidemia 02/28/2018  . Type 2 diabetes mellitus without complications ([place]) 44/17/1278  . Phlebitis 05/12/2017  . Leg swelling 05/12/2017  . Tobacco use disorder 04/13/2017  . GERD (gastroesophageal reflux  disease)   . Hypertension   . TTP (thrombotic thrombocytopenic purpura) 02/06/2017  . Alcohol use 02/03/2017  . Renal insufficiency 02/03/2017  . Thrombocytopenia ([place]) 02/01/2017  . Macrocytic anemia 02/01/2017  . Microscopic hematuria 02/01/2017  . Diverticulosis 02/01/2017    Izell Dunkerton, PT, DPT 04/05/2020, 3:26 PM  [facility] [address] [place], [place], [address] Phone: [phone]   Fax:  [phone]  Name: Eric Hooper MRN: [id number] Date of Birth: 1976-01-06   PHYSICAL THERAPY DISCHARGE SUMMARY  Visits from Start of Care: 1  Current functional level related to goals / functional outcomes: unknown   Remaining deficits: unknown   Education / Equipment: HEP  Plan: Patient agrees to discharge.  Patient goals were not met. Patient is being discharged due to not returning since the last visit.  ?????    Phill Myron. Yvette Rack, PT, DPT

## 2020-04-05 NOTE — Patient Instructions (Signed)
Access Code: [email or web address] URL: [email or web address] Date: 04/05/2020 Prepared by: Kathreen Cornfield  Exercises Standing Wrist Extension Stretch - 1 x daily - 7 x weekly - 3 sets - 20 seconds hold Standing Wrist Extensor Stretch with Arm Straight - 1 x daily - 7 x weekly - 3 sets - 20 seconds hold Seated Scapular Retraction - 1 x daily - 7 x weekly - 2 sets - 10 reps - 5 seconds hold Seated Cervical Sidebending Stretch - 1 x daily - 7 x weekly - 3 sets - 20 seconds hold

## 2020-04-30 ENCOUNTER — Ambulatory Visit: Payer: No Typology Code available for payment source | Admitting: Family Medicine

## 2020-05-17 ENCOUNTER — Ambulatory Visit: Payer: No Typology Code available for payment source | Admitting: Family Medicine

## 2020-05-18 ENCOUNTER — Other Ambulatory Visit: Payer: Self-pay | Admitting: Family Medicine

## 2020-05-18 DIAGNOSIS — E782 Mixed hyperlipidemia: Secondary | ICD-10-CM

## 2020-05-22 ENCOUNTER — Other Ambulatory Visit: Payer: Self-pay | Admitting: Family Medicine

## 2020-05-22 DIAGNOSIS — M5412 Radiculopathy, cervical region: Secondary | ICD-10-CM

## 2020-06-14 ENCOUNTER — Ambulatory Visit (INDEPENDENT_AMBULATORY_CARE_PROVIDER_SITE_OTHER): Payer: No Typology Code available for payment source | Admitting: Family Medicine

## 2020-06-14 ENCOUNTER — Other Ambulatory Visit: Payer: Self-pay

## 2020-06-14 VITALS — BP 142/80 | HR 73 | Ht 70.0 in | Wt 204.1 lb

## 2020-06-14 DIAGNOSIS — M3119 Other thrombotic microangiopathy: Secondary | ICD-10-CM | POA: Diagnosis not present

## 2020-06-14 DIAGNOSIS — E119 Type 2 diabetes mellitus without complications: Secondary | ICD-10-CM

## 2020-06-14 DIAGNOSIS — M25512 Pain in left shoulder: Secondary | ICD-10-CM

## 2020-06-14 DIAGNOSIS — G8929 Other chronic pain: Secondary | ICD-10-CM

## 2020-06-14 DIAGNOSIS — I10 Essential (primary) hypertension: Secondary | ICD-10-CM

## 2020-06-14 LAB — POCT GLYCOSYLATED HEMOGLOBIN (HGB A1C): Hemoglobin A1C: 7.9 % — AB (ref 4.0–5.6)

## 2020-06-14 MED ORDER — AMLODIPINE BESYLATE 5 MG PO TABS: 5.0000 mg | ORAL_TABLET | Freq: Every day | ORAL | 3 refills | Status: DC

## 2020-06-14 MED ORDER — LOSARTAN POTASSIUM 50 MG PO TABS
50.0000 mg | ORAL_TABLET | Freq: Every day | ORAL | 2 refills | Status: DC
Start: 2020-06-14 — End: 2020-07-06

## 2020-06-14 NOTE — Patient Instructions (Signed)
It was great seeing you today!  Please check-out at the front desk before leaving the clinic. I'd like to see you back in 1 month but if you need to be seen earlier than that for any new issues we're happy to fit you in, just give [place] a call!  Visit Remembers: - Stop by the pharmacy to pick up your prescriptions  - Continue to work on your healthy eating habits and incorporating exercise into your daily life. (see below) - Your goal is to have an A1c <7. - Medicine Changes: For your high blood pressure we will increase your Losartan to 50 mg daily.  You can take two of the 25 mg tablets until you finish your prescription. I sent a new 50 mg prescription to your pharmacy.  - You will get a call about scheduling a exam with sports medicine for your shoulder pain. I am concerned that you have injured one of your rotator cuff muscles. - For pain: Increase Tylenol (2-500 tablets) 1000 mg up to four times daily. AVOID NSAIDS. Continue with topical treatments lidocaine or Aspercreme.  Continue Gabapentin.      Diet Recommendations for Diabetes  Carbohydrate includes starch, sugar, and fiber.  Of these, only sugar and starch raise blood glucose.  (Fiber is found in fruits, vegetables [especially skin, seeds, and stalks] and whole grains.)   Starchy (carb) foods: Bread, rice, pasta, potatoes, corn, cereal, grits, crackers, bagels, muffins, all baked goods.  (Fruit, milk, and yogurt also have carbohydrate, but most of these foods will not spike your blood sugar as most starchy foods will.)  A few fruits do cause high blood sugars; use small portions of bananas (limit to 1/2 at a time), grapes, watermelon, oranges, and most tropical fruits.   Protein foods: Meat, fish, poultry, eggs, dairy foods, and beans such as pinto and kidney beans (beans also provide carbohydrate).   1. Eat at least REAL 3 meals and 1-2 snacks per day. Never go more than 4-5 hours while awake without eating. Eat breakfast within the  first hour of getting up.   2. Limit starchy foods to TWO per meal and ONE per snack. ONE portion of a starchy food is equal to the following:   - ONE slice of bread (or its equivalent, such as half of a hamburger bun).   - 1/2 cup of a "scoopable" starchy food such as potatoes or rice.   - 15 grams of Total Carbohydrate as shown on food label.   - Every 4 ounces of a sweet drink (including fruit juice). 3. Include at every meal: a protein food, a carb food, and vegetables and/or fruit.   - Obtain twice the volume of veg's as protein or carbohydrate foods for both lunch and dinner.   - Fresh or frozen veg's are best.   - Keep frozen veg's on hand for a quick vegetable serving.       Regarding lab work today:  Due to recent changes in healthcare laws, you may see the results of your imaging and laboratory studies on MyChart before your doctor has had a chance to review them.  We understand that in some cases there may be results that are confusing or concerning to you. Not all laboratory results come back in the same time frame and your doctor may be waiting for multiple results in order to interpret others.  Please give [place] 72 hours in order for your doctor to thoroughly review all the results before contacting the  office for clarification of your results. If everything is normal, you will get a letter in the mail or a message in My Chart. Please give [place] a call if you do not hear from [place] after 2 weeks.  Please bring all of your medications with you to each visit.    If you haven't already, sign up for My Chart to have easy access to your labs results, and communication with your primary care physician.  Feel free to call with any questions or concerns at any time, at [phone].   Take care,  Dr. Rushie [facility]

## 2020-06-14 NOTE — Progress Notes (Signed)
   SUBJECTIVE:   CHIEF COMPLAINT / HPI:   Chief Complaint  Patient presents with  . Shoulder Pain    Left      Eric Hooper is a 45 y.o. male here for follow up of shoulder pain, HTN and diabetes.  Diabetes Mellitus  Eats liberally.  Denies missing any doses of DM medications and takes 1000 mg Metformin BID. Denies increased thirst, hunger, or frequent urination.     HTN Takes amlodipine and losartan. Denies missing doses of antihypertensive medications. Denies chest pain, palpitations, lower extremity edema, exertional dyspnea, lightheadedness, headaches and vision changes.    Left Shoulder Pain  Taking Gabapentin without relief. Got hurt at work years ago. Takes once 1000 mg daily Tylenol. Tried lidocaine patches, [place], Diclofenac gel. Lifting cabinets and walls. Worse with laying on his left shoulder. Does not think PT was helpful and wants to avoid taking time off work for PT. Pain rated 10/10. States it is always there. Remote hx of workplace injury whereas the cabinet slipped and trapped him against the wall.      PERTINENT  PMH / PSH: reviewed and updated as appropriate   OBJECTIVE:   BP (!) 142/80   Pulse 73   Ht 5\' 10"  (1.778 m)   Wt 204 lb 2 oz (92.6 kg)   SpO2 97%   BMI 29.29 kg/m    GEN: well appearing male, in no acute distress  NECK: normal ROM, positive Spurling's test CV: regular rate and rhythm, no murmurs appreciated  RESP: no increased work of breathing, clear to ascultation bilaterally  MSK: no LE edema Shoulder, left: No evidence of bony deformity, asymmetry, or muscle atrophy; No tenderness over long head of biceps (bicipital groove). No TTP at [facility] joint. Limited extension and abduction of LUE. Normal lift off and scratch test. Positive Empty Can. Positive Hawkins. Negative Neers.  No abnormal scapular function observed. Sensation intact. Peripheral pulses intact. Multiple tender points in the left upper trapezius.  SKIN: warm,  dry    ASSESSMENT/PLAN:   TTP (thrombotic thrombocytopenic purpura) ([place]) Follows with hematology. Avoid NSAIDs.  Hypertension BP 142/80. Increase Losartan to 50 mg and continue amlodipine 5 mg. BMP today.   Type 2 diabetes mellitus without complications (HCC) Uncontrolled. A1c 7.9 today and previously 8.3. Continue metformin 1000 mg BID. Discussed diet and lifestyle modifications extensively. Patient does not want to start additional agents for DM. He requests time to "straighten out" his diet as he did it once before. Follow up in 1 month.   Statin therapy: Lipitor ACEi/ARB:  Losartan Foot exam: UTD Urine microalbumine: UTD Eye exam: discussed need    Chronic left shoulder pain Chronic and uncontrolled. Continue Gabapentin and topicals. Declined further PT. Referral to sports medicine placed as there is concern for rotator cuff injury given hx of trauma and exam. Reports his job will not put him on light duty and has not had time to rest shoulder. Consider cervical radiculopathy, OA. AVOID NSAIDs as this was the cause of TTP (per hematology).       Lyndee Hensen, DO PGY-2, [facility] 06/14/2020

## 2020-06-15 LAB — BASIC METABOLIC PANEL
BUN/Creatinine Ratio: 12 (ref 9–20)
BUN: 9 mg/dL (ref 6–24)
CO2: 23 mmol/L (ref 20–29)
Calcium: 9.5 mg/dL (ref 8.7–10.2)
Chloride: 99 mmol/L (ref 96–106)
Creatinine, Ser: 0.78 mg/dL (ref 0.76–1.27)
GFR calc Af Amer: 125 mL/min/{1.73_m2} (ref >59)
GFR calc non Af Amer: 108 mL/min/{1.73_m2} (ref >59)
Glucose: 126 mg/dL — ABNORMAL HIGH (ref 65–99)
Potassium: 3.1 mmol/L — ABNORMAL LOW (ref 3.5–5.2)
Sodium: 139 mmol/L (ref 134–144)

## 2020-06-17 NOTE — Assessment & Plan Note (Signed)
BP 142/80. Increase Losartan to 50 mg and continue amlodipine 5 mg. BMP today.

## 2020-06-17 NOTE — Assessment & Plan Note (Addendum)
Uncontrolled. A1c 7.9 today and previously 8.3. Continue metformin 1000 mg BID. Discussed diet and lifestyle modifications extensively. Patient does not want to start additional agents for DM. He requests time to "straighten out" his diet as he did it once before. Follow up in 1 month.   Statin therapy: Lipitor ACEi/ARB:  Losartan Foot exam: UTD Urine microalbumine: UTD Eye exam: discussed need

## 2020-06-17 NOTE — Assessment & Plan Note (Signed)
Follows with hematology. Avoid NSAIDs.

## 2020-06-17 NOTE — Assessment & Plan Note (Signed)
Chronic and uncontrolled. Continue Gabapentin and topicals. Declined further PT. Referral to sports medicine placed as there is concern for rotator cuff injury given hx of trauma and exam. Reports his job will not put him on light duty and has not had time to rest shoulder. Consider cervical radiculopathy, OA. AVOID NSAIDs as this was the cause of TTP (per hematology).

## 2020-06-19 ENCOUNTER — Ambulatory Visit (INDEPENDENT_AMBULATORY_CARE_PROVIDER_SITE_OTHER): Payer: No Typology Code available for payment source | Admitting: Family Medicine

## 2020-06-19 ENCOUNTER — Other Ambulatory Visit: Payer: Self-pay

## 2020-06-19 ENCOUNTER — Ambulatory Visit: Payer: Self-pay

## 2020-06-19 VITALS — BP 158/115 | Ht 70.0 in | Wt 204.0 lb

## 2020-06-19 DIAGNOSIS — M25512 Pain in left shoulder: Secondary | ICD-10-CM

## 2020-06-19 DIAGNOSIS — M5010 Cervical disc disorder with radiculopathy, unspecified cervical region: Secondary | ICD-10-CM | POA: Diagnosis not present

## 2020-06-19 DIAGNOSIS — G8929 Other chronic pain: Secondary | ICD-10-CM

## 2020-06-19 NOTE — Patient Instructions (Signed)
You have cervical radiculopathy (a pinched nerve in the neck). Given your lack of improvement to conservative treatment we will go ahead with an MRI of your cervical spine to further assess. Ok to continue with exercises you learned in therapy, the gabapentin, topical medications like biofreeze or capsaicin. Heat as needed 15 minutes at a time. Follow up with me after the MRI in a no charge visit to go over results and next steps. Watch head position when on computers, texting, when sleeping in bed - should in line with back to prevent further nerve traction and irritation.

## 2020-06-20 ENCOUNTER — Encounter: Payer: Self-pay | Admitting: Family Medicine

## 2020-06-20 DIAGNOSIS — M5010 Cervical disc disorder with radiculopathy, unspecified cervical region: Secondary | ICD-10-CM | POA: Insufficient documentation

## 2020-06-20 NOTE — Progress Notes (Signed)
PCP: Lyndee Hensen, DO  Subjective:   HPI: Patient is a 45 y.o. male here for left shoulder pain.  Patient reports about a year ago he was pinned against a cabinet. He saw occupational health at that time and per records improved, returned to full duty. Patient reports he's continued to have problems however with worsening especially past 6 months of left anterior shoulder pain radiating into neck. Has been taking gabapentin with minimal benefit though has been making him sleepy. Pain up to 10/10 level. States pain goes down arm past lateral elbow into 3rd digit. Describes this as a shocking pain. Worse with neck motions. Did 1 visit of PT with home exercises, taking tylenol as well. Radiographs of cervical spine, shoulder without acute findings done in [facility] system.  Past Medical History:  Diagnosis Date  . Controlled diabetes mellitus type 2 with complications ([place]) 67/09/7207  . Diabetes mellitus without complication (HCC)    non-insulin dependent  . Diverticulosis 02/01/2017   on CT scan abd/pelvis  . GERD (gastroesophageal reflux disease)   . Hypertension   . Kidney stones 02/03/2017  . Mixed hyperlipidemia 02/28/2018  . Smoker 04/13/2017    Current Outpatient Medications on File Prior to Visit  Medication Sig Dispense Refill  . amLODipine (NORVASC) 5 MG tablet Take 1 tablet (5 mg total) by mouth at bedtime. 90 tablet 3  . atorvastatin (LIPITOR) 40 MG tablet TAKE 1 TABLET BY MOUTH EVERY DAY 90 tablet 1  . blood glucose meter kit and supplies KIT Dispense based on patient and insurance preference. Use up to four times daily as directed. (FOR ICD-9 250.00, 250.01). (Patient not taking: Reported on 04/05/2020) 1 each 0  . blood glucose meter kit and supplies Dispense based on patient and insurance preference. Use up to four times daily as directed. (FOR ICD-10 E10.9, E11.9). (Patient not taking: Reported on 04/05/2020) 1 each 0  . esomeprazole (NEXIUM) 40 MG capsule Take 1  capsule (40 mg total) by mouth 2 (two) times daily before a meal. (Patient taking differently: Take 40 mg by mouth 2 (two) times daily as needed.) 90 capsule 1  . Fluticasone Propionate (XHANCE) 93 MCG/ACT EXHU Place 2 sprays into the nose daily. 16 mL 0  . gabapentin (NEURONTIN) 300 MG capsule TAKE 1 CAPSULE BY MOUTH EVERYDAY AT BEDTIME 30 capsule 0  . losartan (COZAAR) 50 MG tablet Take 1 tablet (50 mg total) by mouth daily. 30 tablet 2  . metFORMIN (GLUCOPHAGE) 1000 MG tablet TAKE 1 TABLET TWICE A DAY 180 tablet 1   No current facility-administered medications on file prior to visit.    Past Surgical History:  Procedure Laterality Date  . ELBOW SURGERY Right   . IR FLUORO GUIDE CV LINE RIGHT  02/02/2017  . IR US GUIDE VASC ACCESS RIGHT  02/02/2017    Allergies  Allergen Reactions  . Ibuprofen Other (See Comments)    Drops platelets/ thrombocytopenia    Social History   Socioeconomic History  . Marital status: Single    Spouse name: Not on file  . Number of children: 2  . Years of education: Not on file  . Highest education level: Not on file  Occupational History  . Occupation: Glass blower/designer  Tobacco Use  . Smoking status: Current Every Day Smoker    Packs/day: 0.50    Years: 15.00    Pack years: 7.50    Types: Cigarettes  . Smokeless tobacco: Never Used  Vaping Use  . Vaping Use: Never used  Substance and Sexual Activity  . Alcohol use: Yes    Comment: 3-4 beers per day most days  . Drug use: No  . Sexual activity: Not on file  Other Topics Concern  . Not on file  Social History Narrative   ** Merged History Encounter **       Social Determinants of Health   Financial Resource Strain: Not on file  Food Insecurity: Not on file  Transportation Needs: Not on file  Physical Activity: Not on file  Stress: Not on file  Social Connections: Not on file  Intimate Partner Violence: Not on file    Family History  Problem Relation Age of Onset  . Hypertension  Mother   . Hyperlipidemia Mother   . Hypertension Father   . Colon cancer Maternal Aunt     BP (!) 158/115   Ht _0  (1.778 m)   Wt 204 lb (92.5 kg)   BMI 29.27 kg/m   No flowsheet data found.  No flowsheet data found.  Review of Systems: See HPI above.     Objective:  Physical Exam:  Gen: NAD, comfortable in exam room  Neck: No gross deformity, swelling, bruising. TTP left paraspinal cervical region, trapezius.  No midline/bony TTP. Extension only 10 degrees, full other motions. BUE strength 5/5.   Sensation intact to light touch currently.   1+ equal reflexes in triceps, biceps, brachioradialis tendons. Positive spurlings on left.  Left shoulder: No swelling, ecchymoses.  No gross deformity. No TTP AC joint, biceps tendon. FROM. Positive Hawkins, negative Neers. Negative Yergasons. Strength 5/5 with empty can and resisted internal/external rotation. Negative apprehension. NV intact distally.  Complete MSK u/s left shoulder: Biceps tendon: intact on long and short views without tenosynovitis Pec major tendon: intact Subscapularis: intact without abnormalities AC joint: minimal effusion.  No other abnormalities Infraspinatus: intact without abnormalities Supraspinatus: intact without abnormalities Posterior glenohumeral joint: small calcific changes of posterior labrum, otherwise normal, no effusion  Impression:  Small calcific changes of posterior labrum, otherwise normal.   Assessment & Plan:  1. Cervical radiculopathy - patient's history, exam, reassuring shoulder ultrasound consistent with left cervical radiculopathy as cause of his pain.  Radiographs without acute findings.  He's not improved despite several months of conservative treatment including physical therapy, home exercises, gabapentin, tylenol.  Cannot take NSAIDs with history of thrombocytopenia caused by these.  Will go ahead with MRI cervical spine.

## 2020-07-05 ENCOUNTER — Other Ambulatory Visit: Payer: Self-pay | Admitting: Family Medicine

## 2020-07-05 DIAGNOSIS — I10 Essential (primary) hypertension: Secondary | ICD-10-CM

## 2020-07-05 DIAGNOSIS — E119 Type 2 diabetes mellitus without complications: Secondary | ICD-10-CM

## 2020-07-06 ENCOUNTER — Other Ambulatory Visit: Payer: No Typology Code available for payment source

## 2020-07-06 ENCOUNTER — Telehealth: Payer: Self-pay | Admitting: Family Medicine

## 2020-07-06 DIAGNOSIS — E119 Type 2 diabetes mellitus without complications: Secondary | ICD-10-CM

## 2020-07-06 DIAGNOSIS — I10 Essential (primary) hypertension: Secondary | ICD-10-CM

## 2020-07-06 MED ORDER — LOSARTAN POTASSIUM 50 MG PO TABS: 50.0000 mg | ORAL_TABLET | Freq: Every day | ORAL | 2 refills | Status: DC

## 2020-07-06 NOTE — Telephone Encounter (Signed)
**  After Hours/ Emergency Line Call**  Received a call from Eric Hooper, girlfriend, who report that Eric Hooper has run out of BP meds.  He was recently seen by his PCP and Losartan was increased to 50 mg daily.  He was unable to get this filled today and reports [pharmacy had only 25 mg tablets on file.  Last taken medication yesterday.  Denying chest pain, SOB, headaches, nausea or vomiting. Prescription sent for Losartan 50 mg daily.  Follow up with PCP as needed.  Red flags discussed.  Will forward to PCP.  Carollee Leitz, MD PGY-2, [facility] 07/06/2020 8:13 PM

## 2020-07-14 ENCOUNTER — Other Ambulatory Visit: Payer: Self-pay

## 2020-07-14 ENCOUNTER — Ambulatory Visit
Admission: RE | Admit: 2020-07-14 | Discharge: 2020-07-14 | Disposition: A | Discharge status: Home / Self Care | Payer: No Typology Code available for payment source | Source: Ambulatory Visit | Attending: Family Medicine | Admitting: Family Medicine

## 2020-07-14 DIAGNOSIS — G8929 Other chronic pain: Secondary | ICD-10-CM

## 2020-07-15 ENCOUNTER — Telehealth: Payer: Self-pay

## 2020-07-15 NOTE — Telephone Encounter (Signed)
Eric Hooper patients girlfriend calls nurse line stating PCP was supposed to call him in something for allergies. Eric Hooper reports this was "discussed" at his last office visit, however nothing was ever called in. Tiffany Kocher I do not see anything in office notes from back in February, however I will reach out to the provider. Advised an apt may be needed.

## 2020-07-22 ENCOUNTER — Other Ambulatory Visit: Payer: Self-pay

## 2020-07-22 ENCOUNTER — Encounter: Payer: Self-pay | Admitting: Family Medicine

## 2020-07-22 ENCOUNTER — Ambulatory Visit (INDEPENDENT_AMBULATORY_CARE_PROVIDER_SITE_OTHER): Payer: No Typology Code available for payment source | Admitting: Family Medicine

## 2020-07-22 VITALS — Ht 70.0 in | Wt 200.0 lb

## 2020-07-22 DIAGNOSIS — M5010 Cervical disc disorder with radiculopathy, unspecified cervical region: Secondary | ICD-10-CM

## 2020-07-22 NOTE — Progress Notes (Signed)
MRI reviewed and discussed with patient.  He's continued to have pain left shoulder up to neck and down left arm into 3rd digit.  No numbness or tingling.  MRI showed degenerative changes especially lower cervical region with moderate foraminal stenosis bilaterally.  History fits with C7 radiculopathy - discussed options and will trial ESI on left, plan to call us a week later with update on his status.

## 2020-07-22 NOTE — Patient Instructions (Signed)
We will send a referral for an injection on the left side of your neck for an irritated nerve going into your left arm (C7 radiculopathy). Call me 1 week after you have the shot to let me know how you're doing - this will dictate where we go next (physical therapy, repeat injection, monitoring).

## 2020-07-23 ENCOUNTER — Other Ambulatory Visit: Payer: Self-pay | Admitting: Family Medicine

## 2020-07-23 DIAGNOSIS — M5010 Cervical disc disorder with radiculopathy, unspecified cervical region: Secondary | ICD-10-CM

## 2020-07-31 ENCOUNTER — Ambulatory Visit
Admission: RE | Admit: 2020-07-31 | Discharge: 2020-07-31 | Disposition: A | Discharge status: Home / Self Care | Payer: No Typology Code available for payment source | Source: Ambulatory Visit | Attending: Family Medicine | Admitting: Family Medicine

## 2020-07-31 ENCOUNTER — Other Ambulatory Visit: Payer: Self-pay

## 2020-07-31 DIAGNOSIS — M5010 Cervical disc disorder with radiculopathy, unspecified cervical region: Secondary | ICD-10-CM

## 2020-07-31 MED ORDER — IOPAMIDOL (ISOVUE-M 300) INJECTION 61%
1.0000 mL | Freq: Once | INTRAMUSCULAR | Status: AC | PRN
  Administered 2020-07-31: 1 mL via EPIDURAL

## 2020-07-31 MED ORDER — TRIAMCINOLONE ACETONIDE 40 MG/ML IJ SUSP (RADIOLOGY)
60.0000 mg | Freq: Once | INTRAMUSCULAR | Status: AC
  Administered 2020-07-31: 60 mg via EPIDURAL

## 2020-07-31 NOTE — Discharge Instructions (Signed)

## 2020-08-25 ENCOUNTER — Other Ambulatory Visit: Payer: Self-pay | Admitting: Family Medicine

## 2020-08-25 DIAGNOSIS — E1165 Type 2 diabetes mellitus with hyperglycemia: Secondary | ICD-10-CM

## 2020-09-12 ENCOUNTER — Telehealth: Payer: Self-pay | Admitting: Family Medicine

## 2020-09-12 NOTE — Telephone Encounter (Signed)
If it gave him some benefit I would recommend repeating the injection.  However, if it truly did not help at all I think I'd have him consult with neurosurgery.

## 2020-09-12 NOTE — Telephone Encounter (Signed)
-----   Message from [facility] [place] sent at 09/11/2020  4:26 PM EDT -----  ----- Message ----- From: Grandville Silos Sent: 09/11/2020   4:03 PM EDT To: Jolinda Croak, LAT  Patient is calling to update doctor from his shots he received in side of neck for shoulder. He stated it did not work and is not helping. Wondering what he needs to do know.

## 2020-09-13 NOTE — Telephone Encounter (Signed)
Pt said the injection didn't help at all, he may be feeling worse. Would like to proceed with referral to neurosurgery.  Referral faxed to [facility], they will call him to schedule.

## 2020-09-17 NOTE — Telephone Encounter (Signed)
Appt with Dr. Marcello Moores on 09/27/20 @ [facility] [place], [place]

## 2020-10-09 ENCOUNTER — Other Ambulatory Visit: Payer: Self-pay | Admitting: Neurosurgery

## 2020-10-09 DIAGNOSIS — M5412 Radiculopathy, cervical region: Secondary | ICD-10-CM

## 2020-10-10 ENCOUNTER — Telehealth: Payer: Self-pay | Admitting: Nurse Practitioner

## 2020-10-10 NOTE — Progress Notes (Signed)
Phone call to patient to verify medication list and allergies for myelogram procedure. Pt aware he will not need to hold any medications for this procedure. Pre and post procedure instructions reviewed with pt. Pt verbalized understanding.  

## 2020-10-17 ENCOUNTER — Encounter: Payer: Self-pay | Admitting: Oncology

## 2020-10-17 ENCOUNTER — Ambulatory Visit
Admission: RE | Admit: 2020-10-17 | Discharge: 2020-10-17 | Disposition: A | Discharge status: Home / Self Care | Payer: No Typology Code available for payment source | Source: Ambulatory Visit | Attending: Neurosurgery | Admitting: Neurosurgery

## 2020-10-17 DIAGNOSIS — M5412 Radiculopathy, cervical region: Secondary | ICD-10-CM

## 2020-10-17 MED ORDER — IOPAMIDOL (ISOVUE-M 300) INJECTION 61%
10.0000 mL | Freq: Once | INTRAMUSCULAR | Status: AC | PRN
  Administered 2020-10-17: 10 mL via INTRATHECAL

## 2020-10-17 MED ORDER — DIAZEPAM 5 MG PO TABS
10.0000 mg | ORAL_TABLET | Freq: Once | ORAL | Status: AC
  Administered 2020-10-17: 10 mg via ORAL

## 2020-10-17 NOTE — Discharge Instructions (Signed)

## 2020-12-11 ENCOUNTER — Other Ambulatory Visit: Payer: Self-pay | Admitting: Family Medicine

## 2020-12-11 DIAGNOSIS — E782 Mixed hyperlipidemia: Secondary | ICD-10-CM

## 2021-01-01 ENCOUNTER — Other Ambulatory Visit: Payer: Self-pay | Admitting: Family Medicine

## 2021-01-01 DIAGNOSIS — E119 Type 2 diabetes mellitus without complications: Secondary | ICD-10-CM

## 2021-01-01 DIAGNOSIS — I10 Essential (primary) hypertension: Secondary | ICD-10-CM

## 2021-05-15 ENCOUNTER — Encounter: Payer: Self-pay | Admitting: Oncology

## 2021-05-15 NOTE — Progress Notes (Signed)
° ° °  SUBJECTIVE:   CHIEF COMPLAINT / HPI:   Right neck pain and arm numbness-patient notes in May 2022 he was loading a truck and lifting a cabinet, and it shifted and crashed against and pinned his right arm and shoulder against the wall.  He had severe pain after this and took out a [employer] form was out for several days.  He went to see sports medicine which diagnosed him with cervical radiculopathy and was sent to a neurosurgeon where he got an epidural spinal injection.  He notes his insurance was then no longer paying for doctors visits and so all of his appointments were canceled.  He notes he missed some many days from work at [employer] that he no longer works there.  He notes his arteries goes numb whenever he is leaning forward on his arm and it goes from his neck all the way down his hands.  He denies any weakness.  He does note pins-and-needles when this returns.  He denies any other trauma to his neck.  He does note bilateral neck pain.  PERTINENT  PMH / PSH: cervical disc disease, TTP, GERD, T2DM  OBJECTIVE:   BP (!) 148/92    Pulse 76    Ht 5\' 10"  (1.778 m)    Wt 200 lb 12.8 oz (91.1 kg)    SpO2 96%    BMI 28.81 kg/m   General: alert & oriented, no apparent distress, well groomed HEENT: normocephalic, atraumatic, EOM grossly intact, oral mucosa moist, neck supple Respiratory: normal respiratory effort GI: non-distended Skin: no rashes, no jaundice Psych: appropriate mood and affect Right upper extremity: right trapezius TTP, right cervical paraspinal muscles TTP, no left sided or C-spine tenderness. Normal sensation light touch in bilateral upper extremities. Normal grip strength, 5/5 muscular strength, + pain with empty can test, + pain with cross arm reach.    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

## 2021-05-16 ENCOUNTER — Encounter: Payer: Self-pay | Admitting: Oncology

## 2021-05-16 ENCOUNTER — Encounter: Payer: Self-pay | Admitting: Family Medicine

## 2021-05-16 ENCOUNTER — Other Ambulatory Visit: Payer: Self-pay

## 2021-05-16 ENCOUNTER — Ambulatory Visit (INDEPENDENT_AMBULATORY_CARE_PROVIDER_SITE_OTHER): Payer: 59 | Admitting: Family Medicine

## 2021-05-16 VITALS — BP 148/92 | HR 76 | Ht 70.0 in | Wt 200.8 lb

## 2021-05-16 DIAGNOSIS — M5412 Radiculopathy, cervical region: Secondary | ICD-10-CM | POA: Diagnosis not present

## 2021-05-16 DIAGNOSIS — M5010 Cervical disc disorder with radiculopathy, unspecified cervical region: Secondary | ICD-10-CM

## 2021-05-16 MED ORDER — PREGABALIN 75 MG PO CAPS: 75.0000 mg | ORAL_CAPSULE | Freq: Two times a day (BID) | ORAL | 0 refills | Status: DC

## 2021-05-16 NOTE — Assessment & Plan Note (Addendum)
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

## 2021-05-16 NOTE — Patient Instructions (Addendum)
It was wonderful to see you today.  Please bring ALL of your medications with you to every visit.   Today we talked about:  I would recommend seeking legal counsel as this injury happened at work  I have resent a referral to neurosurgery to discuss further treatment options  Start Lyrica 75mg  twice daily to help with the radiating pain. The first time you take it try it at night when you don't have to drive anywhere as it can make you sleepy.   Thank you for choosing [facility].   Please call [phone] with any questions about today's appointment.  Please be sure to schedule follow up at the front  desk before you leave today.   Yehuda Savannah, MD  Family Medicine

## 2021-05-22 ENCOUNTER — Other Ambulatory Visit: Payer: Self-pay | Admitting: Family Medicine

## 2021-05-22 DIAGNOSIS — M5010 Cervical disc disorder with radiculopathy, unspecified cervical region: Secondary | ICD-10-CM

## 2021-05-22 NOTE — Progress Notes (Signed)
Referral to ortho as no neurosurgery that will take insurance, for consideration of injections

## 2021-05-26 ENCOUNTER — Encounter: Payer: Self-pay | Admitting: Oncology

## 2021-05-28 ENCOUNTER — Encounter: Payer: Self-pay | Admitting: Oncology

## 2021-06-18 ENCOUNTER — Ambulatory Visit (INDEPENDENT_AMBULATORY_CARE_PROVIDER_SITE_OTHER): Payer: 59

## 2021-06-18 ENCOUNTER — Ambulatory Visit: Payer: 59 | Admitting: Orthopaedic Surgery

## 2021-06-18 ENCOUNTER — Other Ambulatory Visit: Payer: Self-pay

## 2021-06-18 ENCOUNTER — Encounter: Payer: Self-pay | Admitting: Orthopaedic Surgery

## 2021-06-18 VITALS — BP 156/95 | HR 85 | Ht 70.0 in | Wt 200.0 lb

## 2021-06-18 DIAGNOSIS — E119 Type 2 diabetes mellitus without complications: Secondary | ICD-10-CM | POA: Diagnosis not present

## 2021-06-18 DIAGNOSIS — M5010 Cervical disc disorder with radiculopathy, unspecified cervical region: Secondary | ICD-10-CM

## 2021-06-18 DIAGNOSIS — M542 Cervicalgia: Secondary | ICD-10-CM

## 2021-06-18 NOTE — Progress Notes (Signed)
Office Visit Note   Patient: Eric Hooper           Date of Birth: [birth year]           MRN: [id number] Visit Date: 06/18/2021              Requested by: Lenoria Chime, [place],  [place] [address] PCP: Lyndee Hensen, DO   Assessment & Plan: Visit Diagnoses:  1. Neck pain   2. Type 2 diabetes mellitus without complication, without long-term current use of insulin (HCC)   3. Cervical disc disorder with radiculopathy of cervical region     Plan: Reviewed the MRI scan as well as cervical myelogram CT scan.  He has some shallow disc protrusion more prominent on the left than right at C5-6 that is consistent with his symptoms.  Treatment options were discussed in detail.  He has continued to have pain without relief persistent symptoms radiating to his left hand which corresponds with the C5-6 disc protrusion left paracentral with mild central stenosis.  Patient has moderate biforaminal stenosis at C5-6.  Plan to be single level cervical fusion.  He has been through conservative treatment persist symptoms greater than a year.  Bothers him on a daily basis bothers him with work.  Patient's failed conservative treatment including anti-inflammatories muscle relaxants, cervical epidural steroid injection.  Procedure discussed risk surgery discussed including dysphagia dysphonia pseudoarthrosis with low risk less than 2% for pseudoarthrosis.  Questions elicited and answered he understands request to proceed.  Plan overnight stay and C5-6 anterior cervical discectomy and fusion allograft and plate.  Follow-Up Instructions: No follow-ups on file.   Orders:  Orders Placed This Encounter  Procedures   XR Cervical Spine 2 or 3 views   HgB A1c   No orders of the defined types were placed in this encounter.     Procedures: No procedures performed   Clinical Data: No additional findings.   Subjective: Chief Complaint  Patient presents with   Neck - Pain    HPI  46 year old male new patient visit with chronic neck pain onset a year ago.  Patient was referred here for surgical treatment of the C5-6 disc protrusion.  He has had cervical epidural 10/17/2020.  MRI scan also CT myelogram available on PACS for review.  MRI scan 07/14/2020 showed multilevel spondylosis with mild stenosis C4-5 and C5-6.  Some mild foraminal narrowing noted at C5-C7.  CT myelogram 10/17/2020 showed broad-based disc protrusion C5-6 with mild narrowing centrally.  Unchanged mild stenosis at C4-5.  Patient is working.  Patient continues to have pain that radiates into his left hand C6 distribution into his thumb.  Review of Systems okay positive type 2 diabetes no recent A1c in a year.  History of tobacco use, hyperlipidemia hypertension GERD.   Objective: Vital Signs: BP (!) 156/95    Pulse 85    Ht 5\' 10"  (1.778 m)    Wt 200 lb (90.7 kg)    BMI 28.70 kg/m   Physical Exam  Ortho Exam  Specialty Comments:  No specialty comments available.  Imaging: No results found.   PMFS History: Patient Active Problem List   Diagnosis Date Noted   Cervical disc disorder with radiculopathy of cervical region 06/20/2020   Seasonal allergic rhinitis 03/05/2020   Chronic left shoulder pain 11/27/2019   Insomnia 06/09/2019   PICC (peripherally inserted central catheter) in place 09/05/2018   Chest wall pain 08/14/2018   At risk for dysfunction of  heart 07/11/2018   Mixed hyperlipidemia 02/28/2018   Type 2 diabetes mellitus without complications ([place]) 46/50/3546   Phlebitis 05/12/2017   Leg swelling 05/12/2017   Tobacco use disorder 04/13/2017   GERD (gastroesophageal reflux disease)    Hypertension    TTP (thrombotic thrombocytopenic purpura) ([facility]) 02/06/2017   Alcohol use 02/03/2017   Renal insufficiency 02/03/2017   Thrombocytopenia ([place]) 02/01/2017   Macrocytic anemia 02/01/2017   Microscopic hematuria 02/01/2017   Diverticulosis 02/01/2017   Past Medical History:   Diagnosis Date   Controlled diabetes mellitus type 2 with complications ([place]) 56/11/1273   Diabetes mellitus without complication (HCC)    non-insulin dependent   Diverticulosis 02/01/2017   on CT scan abd/pelvis   GERD (gastroesophageal reflux disease)    Hypertension    Kidney stones 02/03/2017   Mixed hyperlipidemia 02/28/2018   Smoker 04/13/2017    Family History  Problem Relation Age of Onset   Hypertension Mother    Hyperlipidemia Mother    Hypertension Father    Colon cancer Maternal Aunt     Past Surgical History:  Procedure Laterality Date   ELBOW SURGERY Right    IR FLUORO GUIDE CV LINE RIGHT  02/02/2017   IR US GUIDE VASC ACCESS RIGHT  02/02/2017   Social History   Occupational History   Occupation: Glass blower/designer  Tobacco Use   Smoking status: Every Day    Packs/day: 0.50    Years: 15.00    Pack years: 7.50    Types: Cigarettes   Smokeless tobacco: Never  Vaping Use   Vaping Use: Never used  Substance and Sexual Activity   Alcohol use: Yes    Comment: 3-4 beers per day most days   Drug use: No   Sexual activity: Not on file

## 2021-06-19 LAB — HEMOGLOBIN A1C
Hgb A1c MFr Bld: 7.6 % of total Hgb — ABNORMAL HIGH (ref <5.7)
Mean Plasma Glucose: 171 mg/dL
eAG (mmol/L): 9.5 mmol/L

## 2021-07-16 ENCOUNTER — Other Ambulatory Visit: Payer: Self-pay | Admitting: Family Medicine

## 2021-07-16 DIAGNOSIS — M5412 Radiculopathy, cervical region: Secondary | ICD-10-CM

## 2021-08-06 ENCOUNTER — Other Ambulatory Visit: Payer: Self-pay | Admitting: Family Medicine

## 2021-08-06 DIAGNOSIS — E119 Type 2 diabetes mellitus without complications: Secondary | ICD-10-CM

## 2021-08-06 DIAGNOSIS — E782 Mixed hyperlipidemia: Secondary | ICD-10-CM

## 2021-08-06 DIAGNOSIS — I10 Essential (primary) hypertension: Secondary | ICD-10-CM

## 2021-08-11 ENCOUNTER — Other Ambulatory Visit: Payer: Self-pay | Admitting: Family Medicine

## 2021-08-11 DIAGNOSIS — I10 Essential (primary) hypertension: Secondary | ICD-10-CM

## 2021-08-14 ENCOUNTER — Ambulatory Visit: Payer: 59 | Admitting: Surgery

## 2021-08-22 ENCOUNTER — Ambulatory Visit: Admit: 2021-08-22 | Payer: 59 | Admitting: Orthopaedic Surgery

## 2021-08-22 DIAGNOSIS — Z01818 Encounter for other preprocedural examination: Secondary | ICD-10-CM

## 2021-08-22 SURGERY — ANTERIOR CERVICAL DECOMPRESSION/DISCECTOMY FUSION 1 LEVEL
Anesthesia: General

## 2021-08-29 ENCOUNTER — Encounter: Payer: 59 | Admitting: Orthopaedic Surgery

## 2021-09-30 ENCOUNTER — Encounter: Payer: Self-pay | Admitting: *Deleted

## 2021-10-07 ENCOUNTER — Other Ambulatory Visit: Payer: Self-pay | Admitting: Family Medicine

## 2021-10-07 DIAGNOSIS — M5412 Radiculopathy, cervical region: Secondary | ICD-10-CM

## 2021-11-10 ENCOUNTER — Other Ambulatory Visit: Payer: Self-pay | Admitting: Family Medicine

## 2021-11-10 DIAGNOSIS — I10 Essential (primary) hypertension: Secondary | ICD-10-CM

## 2021-11-22 ENCOUNTER — Emergency Department (HOSPITAL_COMMUNITY): Payer: 59

## 2021-11-22 ENCOUNTER — Other Ambulatory Visit: Payer: Self-pay

## 2021-11-22 ENCOUNTER — Inpatient Hospital Stay (HOSPITAL_COMMUNITY)
Admission: EM | Admit: 2021-11-22 | Discharge: 2021-11-29 | DRG: 546 | Disposition: A | Discharge status: Home / Self Care | Payer: 59 | Attending: Family Medicine | Admitting: Family Medicine

## 2021-11-22 ENCOUNTER — Encounter: Payer: Self-pay | Admitting: Oncology

## 2021-11-22 DIAGNOSIS — F17210 Nicotine dependence, cigarettes, uncomplicated: Secondary | ICD-10-CM | POA: Diagnosis present

## 2021-11-22 DIAGNOSIS — M3119 Other thrombotic microangiopathy: Secondary | ICD-10-CM | POA: Diagnosis not present

## 2021-11-22 DIAGNOSIS — Z886 Allergy status to analgesic agent status: Secondary | ICD-10-CM

## 2021-11-22 DIAGNOSIS — R17 Unspecified jaundice: Secondary | ICD-10-CM | POA: Diagnosis present

## 2021-11-22 DIAGNOSIS — Z7984 Long term (current) use of oral hypoglycemic drugs: Secondary | ICD-10-CM

## 2021-11-22 DIAGNOSIS — I10 Essential (primary) hypertension: Secondary | ICD-10-CM | POA: Diagnosis present

## 2021-11-22 DIAGNOSIS — N390 Urinary tract infection, site not specified: Secondary | ICD-10-CM

## 2021-11-22 DIAGNOSIS — Z23 Encounter for immunization: Secondary | ICD-10-CM

## 2021-11-22 DIAGNOSIS — I152 Hypertension secondary to endocrine disorders: Secondary | ICD-10-CM | POA: Diagnosis present

## 2021-11-22 DIAGNOSIS — N3001 Acute cystitis with hematuria: Secondary | ICD-10-CM | POA: Diagnosis present

## 2021-11-22 DIAGNOSIS — M5412 Radiculopathy, cervical region: Secondary | ICD-10-CM | POA: Diagnosis present

## 2021-11-22 DIAGNOSIS — D696 Thrombocytopenia, unspecified: Secondary | ICD-10-CM | POA: Diagnosis present

## 2021-11-22 DIAGNOSIS — E876 Hypokalemia: Secondary | ICD-10-CM

## 2021-11-22 DIAGNOSIS — Z83438 Family history of other disorder of lipoprotein metabolism and other lipidemia: Secondary | ICD-10-CM

## 2021-11-22 DIAGNOSIS — E119 Type 2 diabetes mellitus without complications: Secondary | ICD-10-CM

## 2021-11-22 DIAGNOSIS — D500 Iron deficiency anemia secondary to blood loss (chronic): Secondary | ICD-10-CM | POA: Diagnosis present

## 2021-11-22 DIAGNOSIS — K219 Gastro-esophageal reflux disease without esophagitis: Secondary | ICD-10-CM | POA: Diagnosis present

## 2021-11-22 DIAGNOSIS — E1165 Type 2 diabetes mellitus with hyperglycemia: Secondary | ICD-10-CM | POA: Diagnosis present

## 2021-11-22 DIAGNOSIS — Z8249 Family history of ischemic heart disease and other diseases of the circulatory system: Secondary | ICD-10-CM

## 2021-11-22 DIAGNOSIS — Z79899 Other long term (current) drug therapy: Secondary | ICD-10-CM

## 2021-11-22 DIAGNOSIS — D599 Acquired hemolytic anemia, unspecified: Secondary | ICD-10-CM | POA: Diagnosis present

## 2021-11-22 DIAGNOSIS — E1159 Type 2 diabetes mellitus with other circulatory complications: Secondary | ICD-10-CM | POA: Diagnosis present

## 2021-11-22 DIAGNOSIS — E782 Mixed hyperlipidemia: Secondary | ICD-10-CM | POA: Diagnosis present

## 2021-11-22 LAB — CBC WITH DIFFERENTIAL/PLATELET
Abs Immature Granulocytes: 0.05 10*3/uL (ref 0.00–0.07)
Basophils Absolute: 0.1 10*3/uL (ref 0.0–0.1)
Basophils Relative: 1 %
Eosinophils Absolute: 0.1 10*3/uL (ref 0.0–0.5)
Eosinophils Relative: 1 %
HCT: 35.8 % — ABNORMAL LOW (ref 39.0–52.0)
Hemoglobin: 12.9 g/dL — ABNORMAL LOW (ref 13.0–17.0)
Immature Granulocytes: 1 %
Lymphocytes Relative: 45 %
Lymphs Abs: 4.8 10*3/uL — ABNORMAL HIGH (ref 0.7–4.0)
MCH: 32.7 pg (ref 26.0–34.0)
MCHC: 36 g/dL (ref 30.0–36.0)
MCV: 90.9 fL (ref 80.0–100.0)
Monocytes Absolute: 1.1 10*3/uL — ABNORMAL HIGH (ref 0.1–1.0)
Monocytes Relative: 10 %
Neutro Abs: 4.4 10*3/uL (ref 1.7–7.7)
Neutrophils Relative %: 42 %
Platelets: 5 10*3/uL — CL (ref 150–400)
RBC: 3.94 MIL/uL — ABNORMAL LOW (ref 4.22–5.81)
RDW: 13.7 % (ref 11.5–15.5)
Smear Review: DECREASED
WBC: 10.5 10*3/uL (ref 4.0–10.5)
nRBC: 0.9 % — ABNORMAL HIGH (ref 0.0–0.2)

## 2021-11-22 LAB — BASIC METABOLIC PANEL
Anion gap: 8 (ref 5–15)
BUN: 22 mg/dL — ABNORMAL HIGH (ref 6–20)
CO2: 25 mmol/L (ref 22–32)
Calcium: 9.7 mg/dL (ref 8.9–10.3)
Chloride: 107 mmol/L (ref 98–111)
Creatinine, Ser: 1.08 mg/dL (ref 0.61–1.24)
GFR, Estimated: >60 mL/min (ref >60)
Glucose, Bld: 171 mg/dL — ABNORMAL HIGH (ref 70–99)
Potassium: 3.4 mmol/L — ABNORMAL LOW (ref 3.5–5.1)
Sodium: 140 mmol/L (ref 135–145)

## 2021-11-22 LAB — URINALYSIS, ROUTINE W REFLEX MICROSCOPIC
Bilirubin Urine: NEGATIVE
Glucose, UA: 250 mg/dL — AB
Ketones, ur: 15 mg/dL — AB
Nitrite: POSITIVE — AB
Protein, ur: >300 mg/dL — AB
Specific Gravity, Urine: 1.01 (ref 1.005–1.030)
pH: 7 (ref 5.0–8.0)

## 2021-11-22 LAB — TECHNOLOGIST SMEAR REVIEW: Plt Morphology: DECREASED

## 2021-11-22 LAB — RETIC PANEL
Immature Retic Fract: 27.9 % — ABNORMAL HIGH (ref 2.3–15.9)
RBC.: 3.92 MIL/uL — ABNORMAL LOW (ref 4.22–5.81)
Retic Count, Absolute: 139.2 10*3/uL (ref 19.0–186.0)
Retic Ct Pct: 3.6 % — ABNORMAL HIGH (ref 0.4–3.1)
Reticulocyte Hemoglobin: 36.8 pg (ref >27.9)

## 2021-11-22 LAB — LACTATE DEHYDROGENASE: LDH: 1687 U/L — ABNORMAL HIGH (ref 98–192)

## 2021-11-22 LAB — CBC
HCT: 38.5 % — ABNORMAL LOW (ref 39.0–52.0)
Hemoglobin: 13.5 g/dL (ref 13.0–17.0)
MCH: 32.4 pg (ref 26.0–34.0)
MCHC: 35.1 g/dL (ref 30.0–36.0)
MCV: 92.3 fL (ref 80.0–100.0)
Platelets: 6 10*3/uL — CL (ref 150–400)
RBC: 4.17 MIL/uL — ABNORMAL LOW (ref 4.22–5.81)
RDW: 13.5 % (ref 11.5–15.5)
WBC: 11.2 10*3/uL — ABNORMAL HIGH (ref 4.0–10.5)
nRBC: 0.5 % — ABNORMAL HIGH (ref 0.0–0.2)

## 2021-11-22 LAB — HEPATIC FUNCTION PANEL
ALT: 37 U/L (ref 0–44)
AST: 67 U/L — ABNORMAL HIGH (ref 15–41)
Albumin: 3.6 g/dL (ref 3.5–5.0)
Alkaline Phosphatase: 70 U/L (ref 38–126)
Bilirubin, Direct: 1.2 mg/dL — ABNORMAL HIGH (ref 0.0–0.2)
Indirect Bilirubin: 11.5 mg/dL — ABNORMAL HIGH (ref 0.3–0.9)
Total Bilirubin: 12.7 mg/dL — ABNORMAL HIGH (ref 0.3–1.2)
Total Protein: 6 g/dL — ABNORMAL LOW (ref 6.5–8.1)

## 2021-11-22 LAB — PLATELET COUNT: Platelets: 5 10*3/uL — CL (ref 150–400)

## 2021-11-22 LAB — URINALYSIS, MICROSCOPIC (REFLEX): RBC / HPF: >50 RBC/hpf (ref 0–5)

## 2021-11-22 LAB — TYPE AND SCREEN
ABO/RH(D): A POS
Antibody Screen: NEGATIVE

## 2021-11-22 LAB — HIV ANTIBODY (ROUTINE TESTING W REFLEX): HIV Screen 4th Generation wRfx: NONREACTIVE

## 2021-11-22 LAB — DIRECT ANTIGLOBULIN TEST (NOT AT ARMC)
DAT, IgG: NEGATIVE
DAT, complement: NEGATIVE

## 2021-11-22 MED ORDER — METHYLPREDNISOLONE SODIUM SUCC 125 MG IJ SOLR
125.0000 mg | Freq: Once | INTRAMUSCULAR | Status: AC
  Administered 2021-11-22: 125 mg via INTRAVENOUS
  Filled 2021-11-22: qty 2

## 2021-11-22 MED ORDER — LOSARTAN POTASSIUM 50 MG PO TABS
50.0000 mg | ORAL_TABLET | Freq: Every day | ORAL | Status: DC
  Administered 2021-11-23 – 2021-11-29 (×7): 50 mg via ORAL
  Filled 2021-11-22 (×7): qty 1

## 2021-11-22 MED ORDER — PANTOPRAZOLE SODIUM 40 MG PO TBEC: 40.0000 mg | DELAYED_RELEASE_TABLET | Freq: Every evening | ORAL | Status: DC | PRN

## 2021-11-22 MED ORDER — ATORVASTATIN CALCIUM 40 MG PO TABS
40.0000 mg | ORAL_TABLET | Freq: Every day | ORAL | Status: DC
  Administered 2021-11-23 – 2021-11-29 (×6): 40 mg via ORAL
  Filled 2021-11-22 (×7): qty 1

## 2021-11-22 MED ORDER — INSULIN ASPART 100 UNIT/ML IJ SOLN
0.0000 [IU] | Freq: Three times a day (TID) | INTRAMUSCULAR | Status: DC
  Administered 2021-11-23: 7 [IU] via SUBCUTANEOUS
  Administered 2021-11-23 (×2): 5 [IU] via SUBCUTANEOUS
  Administered 2021-11-24 – 2021-11-25 (×4): 2 [IU] via SUBCUTANEOUS
  Administered 2021-11-25: 9 [IU] via SUBCUTANEOUS
  Administered 2021-11-26: 2 [IU] via SUBCUTANEOUS

## 2021-11-22 MED ORDER — AMLODIPINE BESYLATE 5 MG PO TABS
5.0000 mg | ORAL_TABLET | Freq: Every day | ORAL | Status: DC
  Administered 2021-11-23 – 2021-11-29 (×7): 5 mg via ORAL
  Filled 2021-11-22 (×7): qty 1

## 2021-11-22 MED ORDER — IOHEXOL 300 MG/ML  SOLN
100.0000 mL | Freq: Once | INTRAMUSCULAR | Status: AC | PRN
  Administered 2021-11-22: 100 mL via INTRAVENOUS

## 2021-11-22 NOTE — ED Notes (Signed)
Unable to get urine per pt.at

## 2021-11-22 NOTE — Assessment & Plan Note (Deleted)
CBG this morning was 97 > 83.  A1c is 5.8. NPO today for Tunnel Catheter placement. D/c meal time novolog given start of glipizide and low CBG this morning -Increased to moderate sliding scale. -Continue glipizide 5 mg daily -Continue home metformin 1000 mg twice daily - Monitor CBGs

## 2021-11-22 NOTE — H&P (Cosign Needed Addendum)
Hospital Admission History and Physical Service Pager: [phone]  Patient name: Eric Hooper Medical record number: [id number] Date of Birth: 1976/01/24 Age: 46 y.o. Gender: male  Primary Care Provider: Rise Patience, DO Consultants: Hematology Code Status: Full Preferred Emergency Contact:  Contact Information     Name Relation Home Work Mobile   [place] Significant other [phone]  [phone]        Chief Complaint: Hematuria   Assessment and Plan: Eric Hooper is a 46 y.o. male presenting with hematuria found to have severe thrombocytopenia . Differential for this patient's presentation of this includes TTP, ITP though likely TTP given his history of this in the past.  PMH significant for TTP, DM2, HTN, GERD, cervical radiculopathy.   * Thrombocytopenia ([place]) Patient presents with hematuria x1 day. On admission platelets 6> 5. In the past patient states that his TTP was secondary to NSAID use, currently takes Aleve twice a day, was told he could still take this. TTP was originally diagnosed in October 2018.  Has been hospitalized for TTP in 2018 and 2020 and required plasma exchange/steroids in the past as well as weekly rituximab.Heme-onc consulted in the ED who recommended additional labs and will come evaluate patient.  -Admit to FPTS MedSurg attending Dr. Gwendlyn Deutscher following -Heme-onc following, appreciate recommendations - Follow-up LDH: If LDH is normal can give platelets and if not will need plasmapheresis. - Avoid NSAID use -Vitals per floor - AM CBC, CMP - SCDs  Acute cystitis with hematuria UA shows red, cloudy urine with glucose, large hemoglobin, 15 ketones, greater than 300 protein, nitrite positive, moderate leukocytes. Asymptomatic.  No suprapubic pain or CVA tenderness on exam. Was given a dose of Rocephin in the ED.   -Continue Rocephin  Type 2 diabetes mellitus without complications (HCC) Last A1c 7.6 5 months ago. Glucose on admission 171. Home  regimen includes metformin '1000mg'$  BID though sometimes only takes it daily.  - Sensitive sliding scale insulin - Monitor CBGs  Hypertension BP 176/114. Home medication: Amlodipine '5mg'$ , Losartan '50mg'$  - continue home meds     Chronic conditions GERD-continue Nexium Tobacco use- 5 cigarettes per day. Denies nicotine patch Alcohol use- 12 packs a day. Agreeable to [employer] consult  Cervical radiculopathy   FEN/GI: npo  VTE Prophylaxis: SCDs  Disposition: med-surg   History of Present Illness:  Eric Hooper is a 46 y.o. male presenting with hematuria  Patient presents because he noticed blood in his urine this morning. Also felt nauseas and felt like his throat was closing- was hard to swallow.  States he has felt dizzy for the past 2 weeks. Does state he has felt dizzy in the past when his platelets were low. States he never had blood in his urine prior. Denies any other bleeding. Has required an admission for TTP requiring any transfusion. Was told after his last treatment with heme onc that he shouldn't have another flare.  Denies chest pain, SOB, dysuria, hematochezia, abdominal pain, pelvic pain, back pain, bruises   Does smoke tobacco about 5 cigarettes a day, alcohol 12 pack a day, recreational drug use   In the ED, Platelets 5. Heme onc Dr. Lindi Adie consulted and advised to order more labs: LDH, reticulocytes, haptoglobin, Coom[place] test. If LDH is normal can give platelets however if not hold off and will need plasmapheresis. Solu medrol 125 mg given UA showing UTI, given 1 dose Rocephin Per ED provider, advised [place] to call Heme onc once LDH results and that will dictate if patient  needs platelets vs a plasma exchange   Review Of Systems: Per HPI   Pertinent Past Medical History: TTP, DM2, HTN, GERD, tobacco use  Pertinent Social History: Tobacco use:  5 cigarettes a day Alcohol use: 12 packs a day Other Substance use: n/a Lives with partner   Pertinent Family History: Dad-  type 2 diabetes   Remainder reviewed in history tab.   Important Outpatient Medications: Included above   Objective: BP (!) 166/104   Pulse 74   Temp 97.6 F (36.4 C) (Oral)   Resp 19   Ht '5\' 10"'$  (1.778 m)   Wt 81.6 kg   SpO2 99%   BMI 25.83 kg/m  Exam: General: alert, pleasant, NAD Eyes: EOMI. Normal conjunctiva  ENTM: MMM Neck: supple. Normal ROM Cardiovascular: RRR no murmurs  Respiratory: CTAB normal WOB Gastrointestinal: soft, non distended, non tender.  MSK: moving extremities equally. No CVA tenderness  Derm: warm, dry. No LE edema  Neuro: alert and oriented. No focal deficits  Psych: mood and affect appropriate. Thought content normal   Labs:  CBC BMET  Recent Labs  Lab 11/22/21 2010  WBC 10.5  HGB 12.9*  HCT 35.8*  PLT 5*   Recent Labs  Lab 11/22/21 1544  NA 140  K 3.4*  CL 107  CO2 25  BUN 22*  CREATININE 1.08  GLUCOSE 171*  CALCIUM 9.7      Imaging Studies Performed:  CT abdomen: Bilateral perinephric soft tissue stranding, with extension into pelvis. No evidence of hydronephrosis.    Shary Key, DO 11/22/2021, 10:37 PM PGY-3, [facility] pager: [phone], text pages welcome Secure chat group [place]

## 2021-11-22 NOTE — Assessment & Plan Note (Deleted)
Asymptomatic. Urine culture was with no growth - s/p Rocephin x1

## 2021-11-22 NOTE — Assessment & Plan Note (Deleted)
Patient presents with hematuria x1 day. On admission platelets 6> 5. In the past patient states that his TTP was secondary to NSAID use, currently takes Aleve twice a day, was told he could still take this. TTP was originally diagnosed in October 2018.  Has been hospitalized for TTP in 2018 and 2020 and required plasma exchange/steroids in the past as well as weekly rituximab.Heme-onc consulted in the ED who recommended additional labs and will come evaluate patient.  -Admit to FPTS MedSurg attending Dr. Gwendlyn Deutscher following -Heme-onc following, appreciate recommendations - Follow-up LDH: If LDH is normal can give platelets and if not will need plasmapheresis. - Avoid NSAID use -Vitals per floor - AM CBC, CMP - SCDs

## 2021-11-22 NOTE — Assessment & Plan Note (Signed)
Patient presents with hematuria x1 day. On admission platelets 6> 5. In the past patient states that his TTP was secondary to NSAID use, currently takes Aleve twice a day, was told he could still take this. TTP was originally diagnosed in October 2018.  Has been hospitalized for TTP in 2018 and 2020 and required plasma exchange/steroids in the past as well as weekly rituximab.Heme-onc consulted in the ED who recommended additional labs and will come evaluate patient.  -Admit to [facility] attending Dr. Gwendlyn Deutscher following -Heme-onc following, appreciate recommendations - Follow-up LDH: If LDH is normal can give platelets and if not will need plasmapheresis. - Avoid NSAID use -Vitals per floor - AM CBC, CMP - SCDs

## 2021-11-22 NOTE — ED Notes (Signed)
Admit provider at bedside at this time 

## 2021-11-22 NOTE — ED Triage Notes (Signed)
Pt reports nausea without vomiting this morning and then multiple episodes of hematuria. Denies hx of same although does report hx of low platelets.

## 2021-11-22 NOTE — Assessment & Plan Note (Deleted)
Stable. home medication include amlodipine '5mg'$ , Losartan '50mg'$  - continue home meds

## 2021-11-22 NOTE — Assessment & Plan Note (Deleted)
UA shows red, cloudy urine with glucose, large hemoglobin, 15 ketones, greater than 300 protein, nitrite positive, moderate leukocytes. Asymptomatic.  No suprapubic pain or CVA tenderness on exam. Was given a dose of Rocephin in the ED.   -Continue Rocephin

## 2021-11-22 NOTE — ED Notes (Signed)
Received verbal report from Tara D RN at this time 

## 2021-11-22 NOTE — ED Provider Notes (Cosign Needed Addendum)
[facility]ERGENCY DEPARTMENT Provider Note   CSN: [id number] Arrival date & time: 11/22/21  1533    History  Chief Complaint  Patient presents with   Hematuria    Eric Hooper is a 46 y.o. male history of recurrent TTP here for evaluation of hematuria.  Patient states this morning he noted some red blood in his urine.  He has never had anything like this previously.  Had some suprapubic pain.  Did not wait to his flanks.  He has not noted any additional bleeding to his gums or easy bruising.  States he does have history of TTP which he thought was under control.  He is not currently on any medications.  He last saw heme-onc in 2020.  For prior TTP he did require inpatient admission, transfusion.  No fever, emesis, chest pain, shortness of breath.  Did note a scratchy throat earlier today which resolved.  No back pain, numbness or weakness.  Follows with family medicine clinic at [facility]  HPI     Home Medications Prior to Admission medications   Medication Sig Start Date End Date Taking? Authorizing Provider  amLODipine (NORVASC) 5 MG tablet TAKE 1 TABLET BY MOUTH EVERYDAY AT BEDTIME Patient taking differently: Take 5 mg by mouth daily. 11/10/21  Yes Lilland, Alana, DO  atorvastatin (LIPITOR) 40 MG tablet TAKE 1 TABLET BY MOUTH EVERY DAY 08/06/21  Yes Brimage, Vondra, DO  esomeprazole (NEXIUM) 40 MG capsule Take 1 capsule (40 mg total) by mouth 2 (two) times daily before a meal. Patient taking differently: Take 40 mg by mouth 2 (two) times daily as needed. 07/11/18  Yes Bonnita Hollow, MD  losartan (COZAAR) 50 MG tablet TAKE 1 TABLET BY MOUTH EVERY DAY 08/06/21  Yes Brimage, Vondra, DO  metFORMIN (GLUCOPHAGE) 1000 MG tablet TAKE 1 TABLET TWICE A DAY Patient taking differently: Take 1,000 mg by mouth 2 (two) times daily with a meal. 08/26/20  Yes Brimage, Vondra, DO  naproxen sodium (ALEVE) 220 MG tablet Take 440 mg by mouth daily.   Yes [provider]   blood glucose meter kit and supplies KIT Dispense based on patient and insurance preference. Use up to four times daily as directed. (FOR ICD-9 250.00, 250.01). 08/25/18   Bonnita Hollow, MD  blood glucose meter kit and supplies Dispense based on patient and insurance preference. Use up to four times daily as directed. (FOR ICD-10 E10.9, E11.9). 09/02/18   Bonnita Hollow, MD  Fluticasone Propionate Truett Perna) 93 MCG/ACT EXHU Place 2 sprays into the nose daily. Patient not taking: Reported on 10/10/2020 03/05/20   [name], MD  pregabalin (LYRICA) 75 MG capsule TAKE 1 CAPSULE BY MOUTH TWICE A DAY Patient not taking: Reported on 11/22/2021 10/07/21   Lyndee Hensen, DO      Allergies    Ibuprofen    Review of Systems   Review of Systems  Constitutional: Negative.   HENT: Negative.    Respiratory: Negative.    Gastrointestinal: Negative.   Genitourinary:  Positive for hematuria. Negative for decreased urine volume, difficulty urinating, dysuria, enuresis, flank pain, frequency, genital sores, penile discharge, penile pain, penile swelling, scrotal swelling, testicular pain and urgency.  Musculoskeletal: Negative.   Skin: Negative.   Neurological: Negative.   All other systems reviewed and are negative.   Physical Exam Updated Vital Signs BP (!) 166/104   Pulse 74   Temp 97.6 F (36.4 C) (Oral)   Resp 19   Ht 5' 10" (  1.778 m)   Wt 81.6 kg   SpO2 99%   BMI 25.83 kg/m  Physical Exam Vitals and nursing note reviewed.  Constitutional:      General: He is not in acute distress.    Appearance: He is well-developed. He is not ill-appearing, toxic-appearing or diaphoretic.  HENT:     Head: Normocephalic and atraumatic.     Mouth/Throat:     Mouth: Mucous membranes are moist.  Eyes:     Pupils: Pupils are equal, round, and reactive to light.  Cardiovascular:     Rate and Rhythm: Normal rate and regular rhythm.     Pulses: Normal pulses.     Heart sounds: Normal heart sounds.   Pulmonary:     Effort: Pulmonary effort is normal. No respiratory distress.     Breath sounds: Normal breath sounds.  Abdominal:     General: Bowel sounds are normal. There is no distension.     Palpations: Abdomen is soft.  Musculoskeletal:        General: Normal range of motion.     Cervical back: Normal range of motion and neck supple.  Skin:    General: Skin is warm and dry.     Capillary Refill: Capillary refill takes less than 2 seconds.  Neurological:     General: No focal deficit present.     Mental Status: He is alert and oriented to person, place, and time.     ED Results / Procedures / Treatments   Labs (all labs ordered are listed, but only abnormal results are displayed) Labs Reviewed  URINALYSIS, ROUTINE W REFLEX MICROSCOPIC - Abnormal; Notable for the following components:      Result Value   Color, Urine RED (*)    APPearance CLOUDY (*)    Glucose, UA 250 (*)    Hgb urine dipstick LARGE (*)    Ketones, ur 15 (*)    Protein, ur >300 (*)    Nitrite POSITIVE (*)    Leukocytes,Ua MODERATE (*)    All other components within normal limits  BASIC METABOLIC PANEL - Abnormal; Notable for the following components:   Potassium 3.4 (*)    Glucose, Bld 171 (*)    BUN 22 (*)    All other components within normal limits  CBC - Abnormal; Notable for the following components:   WBC 11.2 (*)    RBC 4.17 (*)    HCT 38.5 (*)    Platelets 6 (*)    nRBC 0.5 (*)    All other components within normal limits  URINALYSIS, MICROSCOPIC (REFLEX) - Abnormal; Notable for the following components:   Bacteria, UA FEW (*)    All other components within normal limits  PLATELET COUNT - Abnormal; Notable for the following components:   Platelets 5 (*)    All other components within normal limits  TECHNOLOGIST SMEAR REVIEW  LACTATE DEHYDROGENASE  RETIC PANEL  HAPTOGLOBIN  CBC WITH DIFFERENTIAL/PLATELET  HEPATIC FUNCTION PANEL  HIV ANTIBODY (ROUTINE TESTING W REFLEX)   COMPREHENSIVE METABOLIC PANEL  CBC WITH DIFFERENTIAL/PLATELET  TYPE AND SCREEN  DIRECT ANTIGLOBULIN TEST (NOT AT [facility])    EKG None  Radiology CT Abdomen Pelvis W Contrast  Result Date: 11/22/2021 CLINICAL DATA:  Acute abdominal pain.  Hematuria. EXAM: CT ABDOMEN AND PELVIS WITH CONTRAST TECHNIQUE: Multidetector CT imaging of the abdomen and pelvis was performed using the standard protocol following bolus administration of intravenous contrast. RADIATION DOSE REDUCTION: This exam was performed according to the departmental dose-optimization   program which includes automated exposure control, adjustment of the mA and/or kV according to patient size and/or use of iterative reconstruction technique. CONTRAST:  118m OMNIPAQUE IOHEXOL 300 MG/ML  SOLN COMPARISON:  Noncontrast CT on 12/21/2017 FINDINGS: Lower Chest: No acute findings. Hepatobiliary: No hepatic masses identified. Gallbladder is unremarkable. No evidence of biliary ductal dilatation. Pancreas:  No mass or inflammatory changes. Spleen: Within normal limits in size and appearance. Adrenals/Urinary Tract: No evidence of ureteral calculi or hydronephrosis. Symmetric perinephric soft tissue stranding is seen bilaterally with extension inferiorly into pelvis. No renal masses or other parenchymal lesions are identified. Urinary bladder is unremarkable. Stomach/Bowel: No evidence of obstruction, inflammatory process or abnormal fluid collections. Vascular/Lymphatic: No pathologically enlarged lymph nodes. No acute vascular findings. Reproductive:  No mass or other significant abnormality. Other:  None. Musculoskeletal:  No suspicious bone lesions identified. IMPRESSION: Bilateral perinephric soft tissue stranding, with extension into pelvis. No evidence of hydronephrosis. This is nonspecific, and differential diagnosis includes bilateral pyelonephritis, vesicoureteral reflux, and medical renal disease. Recommend correlation with urinalysis.  Electronically Signed   By: JMarlaine HindM.D.   On: 11/22/2021 18:56    Procedures .Critical Care  Performed by: HNettie Elm PA-C Authorized by: HNettie Elm PA-C   Critical care provider statement:    Critical care time (minutes):  35   Critical care was necessary to treat or prevent imminent or life-threatening deterioration of the following conditions:  Circulatory failure and metabolic crisis   Critical care was time spent personally by me on the following activities:  Development of treatment plan with patient or surrogate, discussions with consultants, evaluation of patient's response to treatment, examination of patient, ordering and review of laboratory studies, ordering and review of radiographic studies, ordering and performing treatments and interventions, pulse oximetry, re-evaluation of patient's condition and review of old charts     Medications Ordered in ED Medications  amLODipine (NORVASC) tablet 5 mg (has no administration in time range)  losartan (COZAAR) tablet 50 mg (has no administration in time range)  atorvastatin (LIPITOR) tablet 40 mg (has no administration in time range)  pantoprazole (PROTONIX) EC tablet 40 mg (has no administration in time range)  iohexol (OMNIPAQUE) 300 MG/ML solution 100 mL (100 mLs Intravenous Contrast Given 11/22/21 1825)  methylPREDNISolone sodium succinate (SOLU-MEDROL) 125 mg/2 mL injection 125 mg (125 mg Intravenous Given 11/22/21 2010)    ED Course/ Medical Decision Making/ A&P     46year old history of prior TTP here for evaluation of gross hematuria which began prior to arrival.  No fever, systemic symptoms.  He appears otherwise well.  No other bleeding.  He was concern for recurrent TTP given his history.  His heart and lungs are clear.  Abdomen soft, nontender.  He has no active bleeding from his gingiva, no epistaxis.  Does have gross hematuria on UA.  Plan on labs and imaging  Labs and imaging personally viewed  and interpreted:  UA positive for hematuria as well as UTI will give Rocephin CBC without leukocytosis, hemoglobin 13.5, platelets 6 Metabolic panel potassium 3.4 Check platelets at 5 with schistocytes   CONSULT with Dr. GLindi Adiewith hematology.  Recommends LDH, reticulocytes, haptoglobin, Coombs test, give Solu-Medrol 125 mg.  If LDH is normal can give platelets however if not hold off and will need plasmapheresis  Request callback at [phone] LDH results  Discussed results with patient in room.  He is agreeable for admission.  He is hypertensive here however asymptomatic.  Missed doses of  medication at home.  CONSULT  with FM who is agreeable to evaluate patient for admission.  They are aware that patient's LDH results are pending and Dr. Gudena will need a phone call with these results tonight  The patient appears reasonably stabilized for admission considering the current resources, flow, and capabilities available in the ED at this time, and I doubt any other EMC requiring further screening and/or treatment in the ED prior to admission.                             Medical Decision Making Amount and/or Complexity of Data Reviewed Independent Historian: spouse External Data Reviewed: labs, radiology, ECG and notes. Labs: ordered. Decision-making details documented in ED Course. Radiology: ordered and independent interpretation performed. Decision-making details documented in ED Course.  Risk OTC drugs. Prescription drug management. Parenteral controlled substances. Drug therapy requiring intensive monitoring for toxicity. Decision regarding hospitalization. Diagnosis or treatment significantly limited by social determinants of health.     Final Clinical Impression(s) / ED Diagnoses Final diagnoses:  TTP (thrombotic thrombocytopenic purpura) (HCC)  Acute cystitis with hematuria  Hypertension, unspecified type    Rx / DC Orders ED Discharge Orders     None           Henderly, Britni A, PA-C 11/22/21 2156    Schlossman, Erin, MD 11/23/21 1205  

## 2021-11-23 ENCOUNTER — Observation Stay (HOSPITAL_COMMUNITY): Payer: 59

## 2021-11-23 DIAGNOSIS — E1165 Type 2 diabetes mellitus with hyperglycemia: Secondary | ICD-10-CM | POA: Diagnosis present

## 2021-11-23 DIAGNOSIS — Z83438 Family history of other disorder of lipoprotein metabolism and other lipidemia: Secondary | ICD-10-CM | POA: Diagnosis not present

## 2021-11-23 DIAGNOSIS — Z8249 Family history of ischemic heart disease and other diseases of the circulatory system: Secondary | ICD-10-CM | POA: Diagnosis not present

## 2021-11-23 DIAGNOSIS — R17 Unspecified jaundice: Secondary | ICD-10-CM | POA: Diagnosis present

## 2021-11-23 DIAGNOSIS — M3119 Other thrombotic microangiopathy: Secondary | ICD-10-CM | POA: Diagnosis present

## 2021-11-23 DIAGNOSIS — M5412 Radiculopathy, cervical region: Secondary | ICD-10-CM | POA: Diagnosis present

## 2021-11-23 DIAGNOSIS — Z23 Encounter for immunization: Secondary | ICD-10-CM | POA: Diagnosis not present

## 2021-11-23 DIAGNOSIS — Z886 Allergy status to analgesic agent status: Secondary | ICD-10-CM | POA: Diagnosis not present

## 2021-11-23 DIAGNOSIS — D696 Thrombocytopenia, unspecified: Secondary | ICD-10-CM | POA: Diagnosis present

## 2021-11-23 DIAGNOSIS — N3001 Acute cystitis with hematuria: Secondary | ICD-10-CM | POA: Diagnosis present

## 2021-11-23 DIAGNOSIS — E782 Mixed hyperlipidemia: Secondary | ICD-10-CM | POA: Diagnosis present

## 2021-11-23 DIAGNOSIS — Z7984 Long term (current) use of oral hypoglycemic drugs: Secondary | ICD-10-CM | POA: Diagnosis not present

## 2021-11-23 DIAGNOSIS — E119 Type 2 diabetes mellitus without complications: Secondary | ICD-10-CM | POA: Diagnosis not present

## 2021-11-23 DIAGNOSIS — Z79899 Other long term (current) drug therapy: Secondary | ICD-10-CM | POA: Diagnosis not present

## 2021-11-23 DIAGNOSIS — D500 Iron deficiency anemia secondary to blood loss (chronic): Secondary | ICD-10-CM | POA: Diagnosis present

## 2021-11-23 DIAGNOSIS — K219 Gastro-esophageal reflux disease without esophagitis: Secondary | ICD-10-CM | POA: Diagnosis present

## 2021-11-23 DIAGNOSIS — F17210 Nicotine dependence, cigarettes, uncomplicated: Secondary | ICD-10-CM | POA: Diagnosis present

## 2021-11-23 DIAGNOSIS — E876 Hypokalemia: Secondary | ICD-10-CM | POA: Diagnosis present

## 2021-11-23 DIAGNOSIS — I10 Essential (primary) hypertension: Secondary | ICD-10-CM | POA: Diagnosis present

## 2021-11-23 DIAGNOSIS — D599 Acquired hemolytic anemia, unspecified: Secondary | ICD-10-CM | POA: Diagnosis present

## 2021-11-23 HISTORY — PX: IR US GUIDE VASC ACCESS RIGHT: IMG2390

## 2021-11-23 HISTORY — PX: IR FLUORO GUIDE CV LINE RIGHT: IMG2283

## 2021-11-23 LAB — GLUCOSE, CAPILLARY
Glucose-Capillary: 256 mg/dL — ABNORMAL HIGH (ref 70–99)
Glucose-Capillary: 215 mg/dL — ABNORMAL HIGH (ref 70–99)

## 2021-11-23 LAB — COMPREHENSIVE METABOLIC PANEL
ALT: 39 U/L (ref 0–44)
AST: 80 U/L — ABNORMAL HIGH (ref 15–41)
Albumin: 3.6 g/dL (ref 3.5–5.0)
Alkaline Phosphatase: 77 U/L (ref 38–126)
Anion gap: 9 (ref 5–15)
BUN: 30 mg/dL — ABNORMAL HIGH (ref 6–20)
CO2: 22 mmol/L (ref 22–32)
Calcium: 9.5 mg/dL (ref 8.9–10.3)
Chloride: 105 mmol/L (ref 98–111)
Creatinine, Ser: 1.12 mg/dL (ref 0.61–1.24)
GFR, Estimated: >60 mL/min (ref >60)
Glucose, Bld: 254 mg/dL — ABNORMAL HIGH (ref 70–99)
Potassium: 3.9 mmol/L (ref 3.5–5.1)
Sodium: 136 mmol/L (ref 135–145)
Total Bilirubin: 13.5 mg/dL — ABNORMAL HIGH (ref 0.3–1.2)
Total Protein: 6 g/dL — ABNORMAL LOW (ref 6.5–8.1)

## 2021-11-23 LAB — CBC WITH DIFFERENTIAL/PLATELET
Abs Immature Granulocytes: 0.08 10*3/uL — ABNORMAL HIGH (ref 0.00–0.07)
Basophils Absolute: 0 10*3/uL (ref 0.0–0.1)
Basophils Relative: 0 %
Eosinophils Absolute: 0 10*3/uL (ref 0.0–0.5)
Eosinophils Relative: 0 %
HCT: 34.6 % — ABNORMAL LOW (ref 39.0–52.0)
Hemoglobin: 12.7 g/dL — ABNORMAL LOW (ref 13.0–17.0)
Immature Granulocytes: 1 %
Lymphocytes Relative: 22 %
Lymphs Abs: 1.8 10*3/uL (ref 0.7–4.0)
MCH: 33 pg (ref 26.0–34.0)
MCHC: 36.7 g/dL — ABNORMAL HIGH (ref 30.0–36.0)
MCV: 89.9 fL (ref 80.0–100.0)
Monocytes Absolute: 0.3 10*3/uL (ref 0.1–1.0)
Monocytes Relative: 3 %
Neutro Abs: 5.8 10*3/uL (ref 1.7–7.7)
Neutrophils Relative %: 74 %
Platelets: <5 10*3/uL — CL (ref 150–400)
RBC: 3.85 MIL/uL — ABNORMAL LOW (ref 4.22–5.81)
RDW: 14.3 % (ref 11.5–15.5)
WBC: 8 10*3/uL (ref 4.0–10.5)
nRBC: 0.6 % — ABNORMAL HIGH (ref 0.0–0.2)

## 2021-11-23 LAB — CBG MONITORING, ED
Glucose-Capillary: 267 mg/dL — ABNORMAL HIGH (ref 70–99)
Glucose-Capillary: 324 mg/dL — ABNORMAL HIGH (ref 70–99)

## 2021-11-23 MED ORDER — ANTICOAGULANT SODIUM CITRATE 4% (200MG/5ML) IV SOLN
5.0000 mL | Freq: Once | Status: AC
  Administered 2021-11-23: 5 mL
  Filled 2021-11-23: qty 5

## 2021-11-23 MED ORDER — DIPHENHYDRAMINE HCL 25 MG PO CAPS
25.0000 mg | ORAL_CAPSULE | Freq: Four times a day (QID) | ORAL | Status: DC | PRN
  Administered 2021-11-23: 25 mg via ORAL

## 2021-11-23 MED ORDER — ACD FORMULA A 0.73-2.45-2.2 GM/100ML VI SOLN
1000.0000 mL | Status: DC
  Administered 2021-11-23: 1000 mL
  Filled 2021-11-23: qty 1000

## 2021-11-23 MED ORDER — CALCIUM CARBONATE ANTACID 500 MG PO CHEW
2.0000 | CHEWABLE_TABLET | ORAL | Status: AC
  Administered 2021-11-23: 400 mg via ORAL
  Filled 2021-11-23 (×2): qty 2

## 2021-11-23 MED ORDER — SODIUM CHLORIDE 0.9% IV SOLUTION: Freq: Once | INTRAVENOUS | Status: DC

## 2021-11-23 MED ORDER — LIDOCAINE HCL 1 % IJ SOLN
INTRAMUSCULAR | Status: AC
  Filled 2021-11-23: qty 20

## 2021-11-23 MED ORDER — LIDOCAINE HCL (PF) 1 % IJ SOLN
INTRAMUSCULAR | Status: AC | PRN
  Administered 2021-11-23: 10 mL

## 2021-11-23 MED ORDER — DIPHENHYDRAMINE HCL 25 MG PO CAPS: 25.0000 mg | ORAL_CAPSULE | Freq: Four times a day (QID) | ORAL | Status: DC | PRN

## 2021-11-23 MED ORDER — HEPARIN SODIUM (PORCINE) 1000 UNIT/ML IJ SOLN
INTRAMUSCULAR | Status: AC
  Filled 2021-11-23: qty 10

## 2021-11-23 MED ORDER — CALCIUM CARBONATE ANTACID 500 MG PO CHEW: 2.0000 | CHEWABLE_TABLET | ORAL | Status: AC

## 2021-11-23 MED ORDER — ACETAMINOPHEN 325 MG PO TABS
650.0000 mg | ORAL_TABLET | ORAL | Status: DC | PRN
  Administered 2021-11-23: 650 mg via ORAL
  Filled 2021-11-23: qty 2

## 2021-11-23 MED ORDER — ACETAMINOPHEN 325 MG PO TABS: 650.0000 mg | ORAL_TABLET | ORAL | Status: DC | PRN

## 2021-11-23 MED ORDER — CALCIUM GLUCONATE-NACL 2-0.675 GM/100ML-% IV SOLN
2.0000 g | Freq: Once | INTRAVENOUS | Status: DC
  Filled 2021-11-23: qty 100

## 2021-11-23 NOTE — Progress Notes (Signed)
Asked to provide nursing for plasmapheresis for this patient w/ presumed acute recurrence of TTP. Plts < 5, gross hematuria, high LDH/ ^bilirubin. Orders per Dr Lindi Adie for daily pheresis x 7d w/ FFP replacement.  We have a pheresis-trained nurse that will do the procedure around 6-7 pm this evening.    Kelly Splinter, MD 11/23/2021, 8:54 AM

## 2021-11-23 NOTE — ED Notes (Signed)
ED TO INPATIENT HANDOFF REPORT  ED Nurse Name and Phone #: Baxter Flattery, RN  S Name/Age/Gender Eric Hooper 46 y.o. male Room/Bed: 001C/001C  Code Status   Code Status: Full Code  Home/SNF/Other Home Patient oriented to: self, place, time, and situation Is this baseline? Yes   Triage Complete: Triage complete  Chief Complaint Thrombocytopenia ([place]) [D69.6]  Triage Note Pt reports nausea without vomiting this morning and then multiple episodes of hematuria. Denies hx of same although does report hx of low platelets.    Allergies Allergies  Allergen Reactions   Ibuprofen Other (See Comments)    Drops platelets/ thrombocytopenia    Level of Care/Admitting Diagnosis ED Disposition     ED Disposition  Admit   Condition  --   [place]: [place] [[id number]]  Level of Care: Med-Surg [16]  May admit patient to [facility] or [facility] if equivalent level of care is available:: No  Covid Evaluation: Asymptomatic - no recent exposure (last 10 days) testing not required  Diagnosis: Thrombocytopenia [facility]) [[id number]]  Admitting Physician: Shary Key [[id number]]  Attending Physician: Andrena Mews T [[id number]]  Certification:: I certify this patient will need inpatient services for at least 2 midnights  Estimated Length of Stay: 2          B Medical/Surgery History Past Medical History:  Diagnosis Date   Controlled diabetes mellitus type 2 with complications ([place]) 20/05/5425   Diabetes mellitus without complication ([place])    non-insulin dependent   Diverticulosis 02/01/2017   on CT scan abd/pelvis   GERD (gastroesophageal reflux disease)    Hypertension    Kidney stones 02/03/2017   Mixed hyperlipidemia 02/28/2018   Smoker 04/13/2017   Past Surgical History:  Procedure Laterality Date   ELBOW SURGERY Right    IR FLUORO GUIDE CV LINE RIGHT  02/02/2017   IR FLUORO GUIDE CV LINE RIGHT  11/23/2021   IR US GUIDE VASC ACCESS RIGHT  02/02/2017    IR US GUIDE VASC ACCESS RIGHT  11/23/2021     A IV Location/Drains/Wounds Patient Lines/Drains/Airways Status     Active Line/Drains/Airways     Name Placement date Placement time Site Days   Peripheral IV 11/22/21 18 G Left;Posterior Hand 11/22/21  2009  Hand  1   Peripheral IV 11/23/21 20 G Right Antecubital 11/23/21  0626  Antecubital  less than 1   Hemodialysis Catheter Right Internal jugular Triple lumen Temporary (Non-Tunneled) 11/23/21  0932  Internal jugular  less than 1            Intake/Output Last 24 hours No intake or output data in the 24 hours ending 11/23/21 1235  Labs/Imaging Results for orders placed or performed during the hospital encounter of 11/22/21 (from the past 48 hour(s))  Basic metabolic panel     Status: Abnormal   Collection Time: 11/22/21  3:44 PM  Result Value Ref Range   Sodium 140 135 - 145 mmol/L   Potassium 3.4 (L) 3.5 - 5.1 mmol/L    Comment: HEMOLYSIS AT THIS LEVEL MAY AFFECT RESULT   Chloride 107 98 - 111 mmol/L   CO2 25 22 - 32 mmol/L   Glucose, Bld 171 (H) 70 - 99 mg/dL    Comment: Glucose reference range applies only to samples taken after fasting for at least 8 hours.   BUN 22 (H) 6 - 20 mg/dL   Creatinine, Ser 1.08 0.61 - 1.24 mg/dL   Calcium 9.7 8.9 - 10.3 mg/dL  GFR, Estimated >60 >60 mL/min    Comment: (NOTE) Calculated using the CKD-EPI Creatinine Equation (2021)    Anion gap 8 5 - 15    Comment: Performed at [facility], [place] [address], [place] [address]  CBC     Status: Abnormal   Collection Time: 11/22/21  3:44 PM  Result Value Ref Range   WBC 11.2 (H) 4.0 - 10.5 K/uL   RBC 4.17 (L) 4.22 - 5.81 MIL/uL   Hemoglobin 13.5 13.0 - 17.0 g/dL   HCT 38.5 (L) 39.0 - 52.0 %   MCV 92.3 80.0 - 100.0 fL   MCH 32.4 26.0 - 34.0 pg   MCHC 35.1 30.0 - 36.0 g/dL   RDW 13.5 11.5 - 15.5 %   Platelets 6 (LL) 150 - 400 K/uL    Comment: Immature Platelet Fraction may be clinically indicated, consider ordering this  additional test [id number] REPEATED TO VERIFY QUESTIONABLE RESULTS RECOLLECTING TO VERIFY THIS CRITICAL RESULT HAS VERIFIED AND BEEN CALLED TO SABA KHAN,RN BY ZELDA BEECH ON 07 29 2023 AT 1653, AND HAS BEEN READ BACK.     nRBC 0.5 (H) 0.0 - 0.2 %    Comment: Performed at [facility], [place] [address], [place], [place] [address]  Urinalysis, Routine w reflex microscopic Urine, Clean Catch     Status: Abnormal   Collection Time: 11/22/21  4:10 PM  Result Value Ref Range   Color, Urine RED (A) YELLOW    Comment: BIOCHEMICALS MAY BE AFFECTED BY COLOR   APPearance CLOUDY (A) CLEAR    Comment: SLIGHT   Specific Gravity, Urine 1.010 1.005 - 1.030   pH 7.0 5.0 - 8.0   Glucose, UA 250 (A) NEGATIVE mg/dL   Hgb urine dipstick LARGE (A) NEGATIVE   Bilirubin Urine NEGATIVE NEGATIVE   Ketones, ur 15 (A) NEGATIVE mg/dL   Protein, ur >300 (A) NEGATIVE mg/dL   Nitrite POSITIVE (A) NEGATIVE   Leukocytes,Ua MODERATE (A) NEGATIVE    Comment: Performed at [facility], [place] [address] [place], [place] [address]  Urinalysis, Microscopic (reflex)     Status: Abnormal   Collection Time: 11/22/21  4:10 PM  Result Value Ref Range   RBC / HPF >50 0 - 5 RBC/hpf   WBC, UA 6-10 0 - 5 WBC/hpf   Bacteria, UA FEW (A) NONE SEEN   Squamous Epithelial / LPF 0-5 0 - 5   Mucus PRESENT     Comment: Performed at [facility], [place] [address] [place], [place] [address]  Type and screen [place]     Status: None   Collection Time: 11/22/21  5:01 PM  Result Value Ref Range   ABO/RH(D) A POS    Antibody Screen NEG    Sample Expiration      11/25/2021,2359 Performed at [facility], [place] [address], [place], [place] [address]   Platelet count     Status: Abnormal   Collection Time: 11/22/21  5:10 PM  Result Value Ref Range   Platelets 5 (LL) 150 - 400 K/uL    Comment: Immature Platelet Fraction may be clinically indicated, consider ordering this additional  test [id number] CRITICAL VALUE NOTED.  VALUE IS CONSISTENT WITH PREVIOUSLY REPORTED AND CALLED VALUE. REPEATED TO VERIFY VERIFIED WITH NEW SAMPLE SCHISTOCYTES PRESENT Performed at [facility], [place] [address] [place],  [address]   Direct antiglobulin test     Status: None  Collection Time: 11/22/21  7:37 PM  Result Value Ref Range   DAT, complement NEG    DAT, IgG      NEG Performed at [place] [address] [place], [place] [address]   Technologist smear review     Status: None   Collection Time: 11/22/21  8:10 PM  Result Value Ref Range   WBC MORPHOLOGY MORPHOLOGY UNREMARKABLE    RBC MORPHOLOGY Schistocytes present     Comment: POLYCHROMASIA PRESENT Bite cells present    Plt Morphology PLATELETS APPEAR DECREASED    Clinical Information TTP     Comment: Performed at [facility], [place] [address], [place], [place] [address]  Retic Panel     Status: Abnormal   Collection Time: 11/22/21  8:10 PM  Result Value Ref Range   Retic Ct Pct 3.6 (H) 0.4 - 3.1 %   RBC. 3.92 (L) 4.22 - 5.81 MIL/uL   Retic Count, Absolute 139.2 19.0 - 186.0 K/uL   Immature Retic Fract 27.9 (H) 2.3 - 15.9 %   Reticulocyte Hemoglobin 36.8 >27.9 pg    Comment:        Given the high negative predictive value of a RET-He result > 32 pg iron deficiency is essentially excluded. If this patient is anemic other etiologies should be considered. Performed at [facility], [place] [address] [place], [place] [address]   CBC with Differential/Platelet     Status: Abnormal   Collection Time: 11/22/21  8:10 PM  Result Value Ref Range   WBC 10.5 4.0 - 10.5 K/uL   RBC 3.94 (L) 4.22 - 5.81 MIL/uL   Hemoglobin 12.9 (L) 13.0 - 17.0 g/dL   HCT 35.8 (L) 39.0 - 52.0 %   MCV 90.9 80.0 - 100.0 fL   MCH 32.7 26.0 - 34.0 pg   MCHC 36.0 30.0 - 36.0 g/dL   RDW 13.7 11.5 - 15.5 %   Platelets 5 (LL) 150 - 400 K/uL    Comment: Immature Platelet Fraction may be clinically indicated,  consider ordering this additional test [id number] CRITICAL VALUE NOTED.  VALUE IS CONSISTENT WITH PREVIOUSLY REPORTED AND CALLED VALUE. REPEATED TO VERIFY    nRBC 0.9 (H) 0.0 - 0.2 %   Neutrophils Relative % 42 %   Neutro Abs 4.4 1.7 - 7.7 K/uL   Lymphocytes Relative 45 %   Lymphs Abs 4.8 (H) 0.7 - 4.0 K/uL   Monocytes Relative 10 %   Monocytes Absolute 1.1 (H) 0.1 - 1.0 K/uL   Eosinophils Relative 1 %   Eosinophils Absolute 0.1 0.0 - 0.5 K/uL   Basophils Relative 1 %   Basophils Absolute 0.1 0.0 - 0.1 K/uL   WBC Morphology MORPHOLOGY UNREMARKABLE    Smear Review PLATELETS APPEAR DECREASED     Comment: PLATELET COUNT CONFIRMED BY SMEAR   Immature Granulocytes 1 %   Abs Immature Granulocytes 0.05 0.00 - 0.07 K/uL   Bite Cells PRESENT    Schistocytes PRESENT    Polychromasia PRESENT     Comment: Performed at [facility], [place] [address], [place], [place] [address]  Hepatic function panel     Status: Abnormal   Collection Time: 11/22/21  9:35 PM  Result Value Ref Range   Total Protein 6.0 (L) 6.5 - 8.1 g/dL   Albumin 3.6 3.5 - 5.0 g/dL   AST 67 (H) 15 - 41 U/L    Comment: HEMOLYSIS AT THIS LEVEL MAY AFFECT RESULT   ALT  37 0 - 44 U/L    Comment: HEMOLYSIS AT THIS LEVEL MAY AFFECT RESULT   Alkaline Phosphatase 70 38 - 126 U/L   Total Bilirubin 12.7 (H) 0.3 - 1.2 mg/dL    Comment: HEMOLYSIS AT THIS LEVEL MAY AFFECT RESULT   Bilirubin, Direct 1.2 (H) 0.0 - 0.2 mg/dL   Indirect Bilirubin 11.5 (H) 0.3 - 0.9 mg/dL    Comment: Performed at [place] [facility] [address] [place], [place] [address]  HIV Antibody (routine testing w rflx)     Status: None   Collection Time: 11/22/21  9:35 PM  Result Value Ref Range   HIV Screen 4th Generation wRfx Non Reactive Non Reactive    Comment: Performed at [facility], [place] [address] [place], [place] [address]  Lactate dehydrogenase     Status: Abnormal   Collection Time: 11/22/21  9:35 PM  Result Value Ref Range   LDH 1,687  (H) 98 - 192 U/L    Comment: HEMOLYSIS AT THIS LEVEL MAY AFFECT RESULT Performed at [facility], [place] [address] [place], [place] [address]   Comprehensive metabolic panel     Status: Abnormal   Collection Time: 11/23/21  3:36 AM  Result Value Ref Range   Sodium 136 135 - 145 mmol/L   Potassium 3.9 3.5 - 5.1 mmol/L    Comment: HEMOLYSIS AT THIS LEVEL MAY AFFECT RESULT HEMOLYSIS AT THIS LEVEL MAY AFFECT RESULT    Chloride 105 98 - 111 mmol/L   CO2 22 22 - 32 mmol/L   Glucose, Bld 254 (H) 70 - 99 mg/dL    Comment: Glucose reference range applies only to samples taken after fasting for at least 8 hours.   BUN 30 (H) 6 - 20 mg/dL   Creatinine, Ser 1.12 0.61 - 1.24 mg/dL   Calcium 9.5 8.9 - 10.3 mg/dL   Total Protein 6.0 (L) 6.5 - 8.1 g/dL   Albumin 3.6 3.5 - 5.0 g/dL   AST 80 (H) 15 - 41 U/L    Comment: HEMOLYSIS AT THIS LEVEL MAY AFFECT RESULT HEMOLYSIS AT THIS LEVEL MAY AFFECT RESULT    ALT 39 0 - 44 U/L    Comment: HEMOLYSIS AT THIS LEVEL MAY AFFECT RESULT HEMOLYSIS AT THIS LEVEL MAY AFFECT RESULT    Alkaline Phosphatase 77 38 - 126 U/L   Total Bilirubin 13.5 (H) 0.3 - 1.2 mg/dL    Comment: HEMOLYSIS AT THIS LEVEL MAY AFFECT RESULT HEMOLYSIS AT THIS LEVEL MAY AFFECT RESULT    GFR, Estimated >60 >60 mL/min    Comment: (NOTE) Calculated using the CKD-EPI Creatinine Equation (2021)    Anion gap 9 5 - 15    Comment: Performed at [facility], [place] [address], [place],  [address]  CBC with Differential/Platelet     Status: Abnormal   Collection Time: 11/23/21  3:36 AM  Result Value Ref Range   WBC 8.0 4.0 - 10.5 K/uL   RBC 3.85 (L) 4.22 - 5.81 MIL/uL   Hemoglobin 12.7 (L) 13.0 - 17.0 g/dL   HCT 34.6 (L) 39.0 - 52.0 %   MCV 89.9 80.0 - 100.0 fL   MCH 33.0 26.0 - 34.0 pg   MCHC 36.7 (H) 30.0 - 36.0 g/dL   RDW 14.3 11.5 - 15.5 %   Platelets <5 (LL) 150 - 400 K/uL    Comment: Immature Platelet Fraction may be clinically indicated, consider ordering this  additional test [id number] CRITICAL VALUE NOTED.  VALUE IS CONSISTENT WITH PREVIOUSLY REPORTED AND CALLED VALUE. REPEATED TO VERIFY    nRBC 0.6 (H) 0.0 - 0.2 %   Neutrophils Relative % 74 %   Neutro Abs 5.8 1.7 - 7.7 K/uL   Lymphocytes Relative 22 %   Lymphs Abs 1.8 0.7 - 4.0 K/uL   Monocytes Relative 3 %   Monocytes Absolute 0.3 0.1 - 1.0 K/uL   Eosinophils Relative 0 %   Eosinophils Absolute 0.0 0.0 - 0.5 K/uL   Basophils Relative 0 %   Basophils Absolute 0.0 0.0 - 0.1 K/uL   Immature Granulocytes 1 %   Abs Immature Granulocytes 0.08 (H) 0.00 - 0.07 K/uL    Comment: Performed at [facility] [address] [place], [place] [address]  Prepare fresh frozen plasma     Status: None (Preliminary result)   Collection Time: 11/23/21  6:03 AM  Result Value Ref Range   Unit Number [id number]    Blood Component Type THW PLS APHR    Unit division 00    Status of Unit ALLOCATED    Transfusion Status OK TO TRANSFUSE    Unit Number [id number]    Blood Component Type THAWED PLASMA    Unit division 00    Status of Unit ALLOCATED    Transfusion Status OK TO TRANSFUSE   Therapeutic plasma exchange (blood bank)     Status: None (Preliminary result)   Collection Time: 11/23/21  6:48 AM  Result Value Ref Range   Plasma Exchange 3258 mLs ordered, 3260 mLs thawed.    Plasma volume needed 3258    Unit Number [id number]    Blood Component Type THAWED PLASMA    Unit division 00    Status of Unit ISSUED    Transfusion Status OK TO TRANSFUSE    Unit Number [id number]    Blood Component Type THAWED PLASMA    Unit division 00    Status of Unit ISSUED    Transfusion Status      OK TO TRANSFUSE Performed at [facility] [address], [place], [place] [address]    Unit Number [id number]    Blood Component Type THAWED PLASMA    Unit division 00    Status of Unit ISSUED    Transfusion Status OK TO TRANSFUSE    Unit Number [id number]    Blood Component Type  THAWED PLASMA    Unit division 00    Status of Unit ISSUED    Transfusion Status OK TO TRANSFUSE    Unit Number [id number]    Blood Component Type THAWED PLASMA    Unit division 00    Status of Unit ISSUED    Transfusion Status OK TO TRANSFUSE    Unit Number [id number]    Blood Component Type THAWED PLASMA    Unit division 00    Status of Unit ISSUED    Transfusion Status OK TO TRANSFUSE    Unit Number [id number]    Blood Component Type THAWED PLASMA    Unit division 00    Status of Unit ISSUED    Transfusion Status OK TO TRANSFUSE    Unit Number [id number]    Blood Component Type THAWED PLASMA    Unit division 00    Status of Unit ISSUED    Transfusion Status OK TO TRANSFUSE    Unit Number [id number]    Blood Component Type THAWED PLASMA    Unit division 00    Status of Unit ISSUED  Transfusion Status OK TO TRANSFUSE    Unit Number [id number]    Blood Component Type THAWED PLASMA    Unit division 00    Status of Unit ISSUED    Transfusion Status OK TO TRANSFUSE   CBG monitoring, ED     Status: Abnormal   Collection Time: 11/23/21  7:49 AM  Result Value Ref Range   Glucose-Capillary 267 (H) 70 - 99 mg/dL    Comment: Glucose reference range applies only to samples taken after fasting for at least 8 hours.  CBG monitoring, ED     Status: Abnormal   Collection Time: 11/23/21 11:51 AM  Result Value Ref Range   Glucose-Capillary 324 (H) 70 - 99 mg/dL    Comment: Glucose reference range applies only to samples taken after fasting for at least 8 hours.   IR Fluoro Guide CV Line Right  Result Date: 11/23/2021 INDICATION: TTP requiring plasmapheresis EXAM: Temporary dialysis catheter placement MEDICATIONS: None ANESTHESIA/SEDATION: None FLUOROSCOPY: Radiation Exposure Index (as provided by the fluoroscopic device): 1 mGy Kerma COMPLICATIONS: None immediate. PROCEDURE: Informed written consent was obtained from the patient after a thorough discussion of the  procedural risks, benefits and alternatives. All questions were addressed. Maximal Sterile Barrier Technique was utilized including caps, mask, sterile gowns, sterile gloves, sterile drape, hand hygiene and skin antiseptic. A timeout was performed prior to the initiation of the procedure. Right neck prepped and draped in the usual sterile fashion. All elements of maximal sterile barrier were utilized including, cap, mask, sterile gown, sterile gloves, large sterile drape, hand scrubbing and 2% Chlorhexidine for skin cleaning. The right internal jugular vein was evaluated with ultrasound and shown to be patent. A permanent ultrasound image was obtained and placed in the patient's medical record. Using sterile gel and a sterile probe cover, the right internal jugular vein was entered with a 21 ga needle during real time ultrasound guidance. 0.018 inch guidewire placed and 21 ga needle exchanged for transitional dilator set. Utilizing fluoroscopy, 0.035 inch guidewire advanced through the transitional dilator the level of the IVC without difficulty. Serial dilation performed, and catheter inserted over the guidewire. The tip was positioned in the right atrium. All lumens of the catheter aspirated and flushed well. The dialysis lumens were locked with Heparin. The catheter was secured to the skin with suture. The insertion site was covered with sterile dressing. IMPRESSION: 15 cm temporary right IJ Trialysis catheter is ready for use. Electronically Signed   By: Miachel Roux M.D.   On: 11/23/2021 09:51   IR US Guide Vasc Access Right  Result Date: 11/23/2021 INDICATION: TTP requiring plasmapheresis EXAM: Temporary dialysis catheter placement MEDICATIONS: None ANESTHESIA/SEDATION: None FLUOROSCOPY: Radiation Exposure Index (as provided by the fluoroscopic device): 1 mGy Kerma COMPLICATIONS: None immediate. PROCEDURE: Informed written consent was obtained from the patient after a thorough discussion of the procedural  risks, benefits and alternatives. All questions were addressed. Maximal Sterile Barrier Technique was utilized including caps, mask, sterile gowns, sterile gloves, sterile drape, hand hygiene and skin antiseptic. A timeout was performed prior to the initiation of the procedure. Right neck prepped and draped in the usual sterile fashion. All elements of maximal sterile barrier were utilized including, cap, mask, sterile gown, sterile gloves, large sterile drape, hand scrubbing and 2% Chlorhexidine for skin cleaning. The right internal jugular vein was evaluated with ultrasound and shown to be patent. A permanent ultrasound image was obtained and placed in the patient's medical record. Using sterile gel and a sterile probe  cover, the right internal jugular vein was entered with a 21 ga needle during real time ultrasound guidance. 0.018 inch guidewire placed and 21 ga needle exchanged for transitional dilator set. Utilizing fluoroscopy, 0.035 inch guidewire advanced through the transitional dilator the level of the IVC without difficulty. Serial dilation performed, and catheter inserted over the guidewire. The tip was positioned in the right atrium. All lumens of the catheter aspirated and flushed well. The dialysis lumens were locked with Heparin. The catheter was secured to the skin with suture. The insertion site was covered with sterile dressing. IMPRESSION: 15 cm temporary right IJ Trialysis catheter is ready for use. Electronically Signed   By: Miachel Roux M.D.   On: 11/23/2021 09:51   CT Abdomen Pelvis W Contrast  Result Date: 11/22/2021 CLINICAL DATA:  Acute abdominal pain.  Hematuria. EXAM: CT ABDOMEN AND PELVIS WITH CONTRAST TECHNIQUE: Multidetector CT imaging of the abdomen and pelvis was performed using the standard protocol following bolus administration of intravenous contrast. RADIATION DOSE REDUCTION: This exam was performed according to the departmental dose-optimization program which includes  automated exposure control, adjustment of the mA and/or kV according to patient size and/or use of iterative reconstruction technique. CONTRAST:  183m OMNIPAQUE IOHEXOL 300 MG/ML  SOLN COMPARISON:  Noncontrast CT on 12/21/2017 FINDINGS: Lower Chest: No acute findings. Hepatobiliary: No hepatic masses identified. Gallbladder is unremarkable. No evidence of biliary ductal dilatation. Pancreas:  No mass or inflammatory changes. Spleen: Within normal limits in size and appearance. Adrenals/Urinary Tract: No evidence of ureteral calculi or hydronephrosis. Symmetric perinephric soft tissue stranding is seen bilaterally with extension inferiorly into pelvis. No renal masses or other parenchymal lesions are identified. Urinary bladder is unremarkable. Stomach/Bowel: No evidence of obstruction, inflammatory process or abnormal fluid collections. Vascular/Lymphatic: No pathologically enlarged lymph nodes. No acute vascular findings. Reproductive:  No mass or other significant abnormality. Other:  None. Musculoskeletal:  No suspicious bone lesions identified. IMPRESSION: Bilateral perinephric soft tissue stranding, with extension into pelvis. No evidence of hydronephrosis. This is nonspecific, and differential diagnosis includes bilateral pyelonephritis, vesicoureteral reflux, and medical renal disease. Recommend correlation with urinalysis. Electronically Signed   By: JMarlaine HindM.D.   On: 11/22/2021 18:56    Pending Labs Unresulted Labs (From admission, onward)     Start     Ordered   11/24/21 0500  Therapeutic plasma exchange with: TPE machine calculated volume.  Obtained from entering patient's HCT, weight, height, and gender into the machine. (blood bank)  Daily at 5am,   R      11/23/21 0647   11/24/21 0500  CBC with Differential/Platelet  Tomorrow morning,   R        11/23/21 0643   11/24/21 0500  Comprehensive metabolic panel  Tomorrow morning,   R        11/23/21 0643   11/23/21 1147  Calcium, ionized   Once,   R        11/23/21 1146   11/23/21 0648  [name] Activity  Once,   R        11/23/21 0647   11/23/21 09169 Urine Culture  (Urine Culture)  Once,   R       Question:  Indication  Answer:  Acute gross hematuria   11/23/21 0612   11/22/21 2010  Pathologist smear review  Once,   R        11/22/21 2010   11/22/21 1937  Haptoglobin  Once,   URGENT  11/22/21 1936   Signed and Held  Basic metabolic panel  Once,   R       Comments: Prior to TPE    Signed and Held   Signed and Held  Therapeutic plasma exchange (blood bank)  Daily at 5am,   R     Comments: Please do daily plasma exchange starting today until we decide to change that plan based on his labs.   Signed and Held   Signed and Held  Lactate dehydrogenase  Daily at 5am,   R      Signed and Held   Signed and Held  CBC with Differential/Platelet  Daily at [employer],   R      Signed and Held   Signed and Held  [name] Activity  Daily at [employer],   R      Signed and Held   Signed and Held  Therapeutic plasma exchange with: Physician specified volume (blood bank)  Once,   R        Signed and Held            Vitals/Pain Today's Vitals   11/23/21 0600 11/23/21 0723 11/23/21 0800 11/23/21 1154  BP: (!) 157/101  (!) 148/110   Pulse: 63  70   Resp: (!) 21  (!) 21   Temp:  98.3 F (36.8 C)  98.4 F (36.9 C)  TempSrc:  Oral  Oral  SpO2: 98%  99%   Weight:      Height:      PainSc:        Isolation Precautions No active isolations  Medications Medications  amLODipine (NORVASC) tablet 5 mg (5 mg Oral Given 11/23/21 1005)  losartan (COZAAR) tablet 50 mg (50 mg Oral Given 11/23/21 1005)  atorvastatin (LIPITOR) tablet 40 mg (40 mg Oral Given 11/23/21 1005)  pantoprazole (PROTONIX) EC tablet 40 mg (has no administration in time range)  insulin aspart (novoLOG) injection 0-9 Units (7 Units Subcutaneous Given 11/23/21 1202)  0.9 %  sodium chloride infusion (Manually program via Guardrails IV Fluids) (has no administration in  time range)  acetaminophen (TYLENOL) tablet 650 mg (has no administration in time range)  diphenhydrAMINE (BENADRYL) capsule 25 mg (has no administration in time range)  calcium gluconate 2 g/ 100 mL sodium chloride IVPB (has no administration in time range)  citrate dextrose (ACD-A anticoagulant) solution 1,000 mL (has no administration in time range)  calcium carbonate (TUMS - dosed in mg elemental calcium) chewable tablet 400 mg of elemental calcium (400 mg of elemental calcium Oral Not Given 11/23/21 1028)  anticoagulant sodium citrate solution 5 mL (has no administration in time range)  heparin sodium (porcine) 1000 UNIT/ML injection (has no administration in time range)  lidocaine (XYLOCAINE) 1 % (with pres) injection (has no administration in time range)  iohexol (OMNIPAQUE) 300 MG/ML solution 100 mL (100 mLs Intravenous Contrast Given 11/22/21 1825)  methylPREDNISolone sodium succinate (SOLU-MEDROL) 125 mg/2 mL injection 125 mg (125 mg Intravenous Given 11/22/21 2010)  lidocaine (PF) (XYLOCAINE) 1 % injection (10 mLs Infiltration Given 11/23/21 0902)    Mobility walks Low fall risk   Focused Assessments Cardiac Assessment Handoff:    Lab Results  Component Value Date   CKTOTAL 228 04/12/2018   TROPONINI 0.03 (HH) 08/15/2018   Lab Results  Component Value Date   DDIMER 0.56 (H) 08/14/2018   Does the Patient currently have chest pain? No    R Recommendations: See Admitting Provider Note  Report given to:   Additional  Notes:

## 2021-11-23 NOTE — Consult Note (Signed)
Renal Service Consult Note [facility]  Eric Hooper 11/23/2021 [name], MD Requesting Physician: Dr. Gwendlyn Deutscher  Reason for Consult: Need for plasmapheresis HPI: The patient is a 46 y.o. year-old w/ hx of DM2, TTP, HTN, HL, smoker who presented to ED yesterday w/ new onset gross hematuria. Pt has a hx of TTP and was treated here back in 2019 and in 2020 under care of Dr. Benay Spice. Labs here showed plt ct of 6, Hb 13.5, LDH 1687 and total bilirubin 12.7 (indirect 11.5). Pt seen by Dr. Lindi Adie of Hem-Onc who has ordered plasamapheresis w/ FFP x 7 days. We are asked to see for plasmapheresis.   Pt seen in ED room. No c/o's except for hematuria.  No fevers, chills, cough, SOB, abd pain or headaches. Eating and drinking okay.   ROS - denies CP, no joint pain, no HA, no blurry vision, no rash, no diarrhea, no nausea/ vomiting, no dysuria, no difficulty voiding   Past Medical History  Past Medical History:  Diagnosis Date   Controlled diabetes mellitus type 2 with complications ([place]) 13/0/8657   Diabetes mellitus without complication ([place])    non-insulin dependent   Diverticulosis 02/01/2017   on CT scan abd/pelvis   GERD (gastroesophageal reflux disease)    Hypertension    Kidney stones 02/03/2017   Mixed hyperlipidemia 02/28/2018   Smoker 04/13/2017   Past Surgical History  Past Surgical History:  Procedure Laterality Date   ELBOW SURGERY Right    IR FLUORO GUIDE CV LINE RIGHT  02/02/2017   IR FLUORO GUIDE CV LINE RIGHT  11/23/2021   IR US GUIDE VASC ACCESS RIGHT  02/02/2017   IR US GUIDE VASC ACCESS RIGHT  11/23/2021   Family History  Family History  Problem Relation Age of Onset   Hypertension Mother    Hyperlipidemia Mother    Hypertension Father    Colon cancer Maternal Aunt    Social History  reports that he has been smoking cigarettes. He has a 7.50 pack-year smoking history. He has never used smokeless tobacco. He reports current alcohol use. He  reports that he does not use drugs. Allergies  Allergies  Allergen Reactions   Ibuprofen Other (See Comments)    Drops platelets/ thrombocytopenia   Home medications Prior to Admission medications   Medication Sig Start Date End Date Taking? Authorizing Provider  amLODipine (NORVASC) 5 MG tablet TAKE 1 TABLET BY MOUTH EVERYDAY AT BEDTIME Patient taking differently: Take 5 mg by mouth daily. 11/10/21  Yes Lilland, Alana, DO  atorvastatin (LIPITOR) 40 MG tablet TAKE 1 TABLET BY MOUTH EVERY DAY 08/06/21  Yes Brimage, Vondra, DO  esomeprazole (NEXIUM) 40 MG capsule Take 1 capsule (40 mg total) by mouth 2 (two) times daily before a meal. Patient taking differently: Take 40 mg by mouth 2 (two) times daily as needed. 07/11/18  Yes Bonnita Hollow, MD  losartan (COZAAR) 50 MG tablet TAKE 1 TABLET BY MOUTH EVERY DAY 08/06/21  Yes Brimage, Vondra, DO  metFORMIN (GLUCOPHAGE) 1000 MG tablet TAKE 1 TABLET TWICE A DAY Patient taking differently: Take 1,000 mg by mouth 2 (two) times daily with a meal. 08/26/20  Yes Brimage, Vondra, DO  naproxen sodium (ALEVE) 220 MG tablet Take 440 mg by mouth daily.   Yes [provider]  blood glucose meter kit and supplies KIT Dispense based on patient and insurance preference. Use up to four times daily as directed. (FOR ICD-9 250.00, 250.01). 08/25/18   Bonnita Hollow, MD  blood glucose meter kit and supplies Dispense based on patient and insurance preference. Use up to four times daily as directed. (FOR ICD-10 E10.9, E11.9). 09/02/18   Bonnita Hollow, MD  Fluticasone Propionate Truett Perna) 93 MCG/ACT EXHU Place 2 sprays into the nose daily. Patient not taking: Reported on 10/10/2020 03/05/20   [name], MD  pregabalin (LYRICA) 75 MG capsule TAKE 1 CAPSULE BY MOUTH TWICE A DAY Patient not taking: Reported on 11/22/2021 10/07/21   Lyndee Hensen, DO     Vitals:   11/23/21 0600 11/23/21 0723 11/23/21 0800 11/23/21 1154  BP: (!) 157/101  (!) 148/110   Pulse:  63  70   Resp: (!) 21  (!) 21   Temp:  98.3 F (36.8 C)  98.4 F (36.9 C)  TempSrc:  Oral  Oral  SpO2: 98%  99%   Weight:      Height:       Exam Gen alert, no distress No rash, cyanosis or gangrene Sclera anicteric, throat clear  No jvd or bruits Chest clear bilat to bases, no rales/ wheezing RRR no MRG Abd soft ntnd no mass or ascites +bs GU normal MS no joint effusions or deformity Ext no LE or UE edema, no wounds or ulcers Neuro is alert, Ox 3 , nf      Home meds include - metformin, amlodipine, atrovastatin, esomeprazole, prns/ vits/ supps    VS - BP 150/ 110,  HR 70-80  , RR 22  temp 98.4    RA 100%    Na 136  K 3.9  CO2 22 BUN 30  Cr 1.12  Ca 9.5  Alb 3.6  AST 80/ Alt 39      Tbili 12.7 , indirect 11.5    eGFR >60     WBC 8K  Hb 12.7  plt < 5     UA cloudy, prot > 300, bact few, > 50 rbc,  6-10 wbc, 0-5 epis  Assessment: Acute TTP - recurrent, per Dr Lindi Adie needs plasmapheresis as above DM2 - per pmd HTN - per pmd  Plan - is for plasmapheresis as soon as possible today then daily for total of 7 days, FFP replacement.      Rob Derrica Sieg  MD 11/23/2021, 11:56 AM Recent Labs  Lab 11/22/21 1544 11/22/21 2010 11/22/21 2135 11/23/21 0336  HGB 13.5 12.9*  --  12.7*  ALBUMIN  --   --  3.6 3.6  CALCIUM 9.7  --   --  9.5  CREATININE 1.08  --   --  1.12  K 3.4*  --   --  3.9

## 2021-11-23 NOTE — ED Notes (Addendum)
0625: Consent signed at bedside for FFP transfusion.  During setup for FFP transfusion, RN was told by Arby Barrette MD to hold on starting transfusion until confirmation of MD [place]. MD's communicated with RN to stop/hold on transfusing FFP.  Plasma Exchange ordered by MD Drucilla Schmidt   Blood Bank cooler containing FFP's returned back to lab. Lab notified beforehand.

## 2021-11-23 NOTE — Procedures (Signed)
Interventional Radiology Procedure Note  Procedure: Temporary Trialysis catheter   Indication: Thrombocytopenia  Findings: Please refer to procedural dictation for full description.  Complications: None  EBL: < 10 mL  Miachel Roux, MD [phone]

## 2021-11-23 NOTE — Consult Note (Signed)
[facility] NOTE  Patient Care Team: Rise Patience, DO as PCP - General (Family Medicine) Ladell Pier, MD as Consulting Physician (Oncology)  CHIEF COMPLAINTS/PURPOSE OF CONSULTATION:  Acute TTP  HISTORY OF PRESENTING ILLNESS:  Eric Hooper 46 y.o. male is here because of hematuria that started 24 hours prior to coming to the hospital.  He had a prior history of TTP that happened twice in 2019 and 2020.  He underwent plasmapheresis and recovered fully.  He was taken care of by Dr. Learta Codding previously.  On admission he was found to have platelets of 6 and a hemoglobin of 13.5 (baseline 15-16).  Every time he goes to the bathroom he sees blood in the urine which is now more of a dark magenta color.  He was seen in the emergency department.  He is awake alert and oriented.  His eyes look jaundiced.  His IV access is quite tenuous and one of the IV access came off and he bled from the IV site.  He tells me that previously the precipitating event was ibuprofen.  This time recently he took Aleve for right shoulder pain which might have been the precipitating cause.  I reviewed her records extensively and collaborated the history with the patient.   MEDICAL HISTORY:  Past Medical History:  Diagnosis Date   Controlled diabetes mellitus type 2 with complications ([place]) 38/07/5362   Diabetes mellitus without complication ([place])    non-insulin dependent   Diverticulosis 02/01/2017   on CT scan abd/pelvis   GERD (gastroesophageal reflux disease)    Hypertension    Kidney stones 02/03/2017   Mixed hyperlipidemia 02/28/2018   Smoker 04/13/2017    SURGICAL HISTORY: Past Surgical History:  Procedure Laterality Date   ELBOW SURGERY Right    IR FLUORO GUIDE CV LINE RIGHT  02/02/2017   IR US GUIDE VASC ACCESS RIGHT  02/02/2017    SOCIAL HISTORY: Social History   Socioeconomic History   Marital status: Single    Spouse name: Not on file   Number of children: 2    Years of education: Not on file   Highest education level: Not on file  Occupational History   Occupation: Glass blower/designer  Tobacco Use   Smoking status: Every Day    Packs/day: 0.50    Years: 15.00    Total pack years: 7.50    Types: Cigarettes   Smokeless tobacco: Never  Vaping Use   Vaping Use: Never used  Substance and Sexual Activity   Alcohol use: Yes    Comment: 3-4 beers per day most days   Drug use: No   Sexual activity: Not on file  Other Topics Concern   Not on file  Social History Narrative   ** Merged History Encounter **       Social Determinants of Health   Financial Resource Strain: Not on file  Food Insecurity: Not on file  Transportation Needs: Not on file  Physical Activity: Not on file  Stress: Not on file  Social Connections: Not on file  Intimate Partner Violence: Not on file    FAMILY HISTORY: Family History  Problem Relation Age of Onset   Hypertension Mother    Hyperlipidemia Mother    Hypertension Father    Colon cancer Maternal Aunt     ALLERGIES:  is allergic to ibuprofen.  MEDICATIONS:  Current Facility-Administered Medications  Medication Dose Route Frequency Provider Last Rate Last Admin   0.9 %  sodium chloride infusion (Manually  program via Guardrails IV Fluids)   Intravenous Once Paige, Victoria J, DO       acetaminophen (TYLENOL) tablet 650 mg  650 mg Oral Q4H PRN Nicholas Lose, MD       amLODipine (NORVASC) tablet 5 mg  5 mg Oral Daily Paige, Victoria J, DO       anticoagulant sodium citrate solution 5 mL  5 mL Intracatheter Once Nicholas Lose, MD       atorvastatin (LIPITOR) tablet 40 mg  40 mg Oral Daily Paige, Victoria J, DO       calcium carbonate (TUMS - dosed in mg elemental calcium) chewable tablet 400 mg of elemental calcium  2 tablet Oral Q3H Nicholas Lose, MD       calcium gluconate 2 g/ 100 mL sodium chloride IVPB  2 g Intravenous Once Nicholas Lose, MD       citrate dextrose (ACD-A anticoagulant) solution 1,000  mL  1,000 mL Other Continuous Nicholas Lose, MD       diphenhydrAMINE (BENADRYL) capsule 25 mg  25 mg Oral Q6H PRN Nicholas Lose, MD       insulin aspart (novoLOG) injection 0-9 Units  0-9 Units Subcutaneous TID WC Paige, Victoria J, DO   5 Units at 11/23/21 0801   losartan (COZAAR) tablet 50 mg  50 mg Oral Daily Paige, Victoria J, DO       pantoprazole (PROTONIX) EC tablet 40 mg  40 mg Oral QHS PRN Shary Key, DO       Current Outpatient Medications  Medication Sig Dispense Refill   amLODipine (NORVASC) 5 MG tablet TAKE 1 TABLET BY MOUTH EVERYDAY AT BEDTIME (Patient taking differently: Take 5 mg by mouth daily.) 90 tablet 0   atorvastatin (LIPITOR) 40 MG tablet TAKE 1 TABLET BY MOUTH EVERY DAY 90 tablet 1   esomeprazole (NEXIUM) 40 MG capsule Take 1 capsule (40 mg total) by mouth 2 (two) times daily before a meal. (Patient taking differently: Take 40 mg by mouth 2 (two) times daily as needed.) 90 capsule 1   losartan (COZAAR) 50 MG tablet TAKE 1 TABLET BY MOUTH EVERY DAY 90 tablet 1   metFORMIN (GLUCOPHAGE) 1000 MG tablet TAKE 1 TABLET TWICE A DAY (Patient taking differently: Take 1,000 mg by mouth 2 (two) times daily with a meal.) 180 tablet 1   naproxen sodium (ALEVE) 220 MG tablet Take 440 mg by mouth daily.     blood glucose meter kit and supplies KIT Dispense based on patient and insurance preference. Use up to four times daily as directed. (FOR ICD-9 250.00, 250.01). 1 each 0   blood glucose meter kit and supplies Dispense based on patient and insurance preference. Use up to four times daily as directed. (FOR ICD-10 E10.9, E11.9). 1 each 0   Fluticasone Propionate (XHANCE) 93 MCG/ACT EXHU Place 2 sprays into the nose daily. (Patient not taking: Reported on 10/10/2020) 16 mL 0   pregabalin (LYRICA) 75 MG capsule TAKE 1 CAPSULE BY MOUTH TWICE A DAY (Patient not taking: Reported on 11/22/2021) 60 capsule 0    REVIEW OF SYSTEMS:   Constitutional: Denies fevers, chills or abnormal night  sweats Hematuria All other systems were reviewed with the patient and are negative.  PHYSICAL EXAMINATION: ECOG PERFORMANCE STATUS: 1 - Symptomatic but completely ambulatory  Vitals:   11/23/21 0600 11/23/21 0723  BP: (!) 157/101   Pulse: 63   Resp: (!) 21   Temp:  98.3 F (36.8 C)  SpO2: 98%  Filed Weights   11/22/21 2102  Weight: 180 lb (81.6 kg)    GENERAL:alert, no distress and comfortable Abdomen: No abdominal pain or tenderness.  No hepatosplenomegaly Extremities: No edema Neuro: Intact Heart: S1-S2 normal Lungs: Clear  LABORATORY DATA:  I have reviewed the data as listed Lab Results  Component Value Date   WBC 8.0 11/23/2021   HGB 12.7 (L) 11/23/2021   HCT 34.6 (L) 11/23/2021   MCV 89.9 11/23/2021   PLT <5 (LL) 11/23/2021   Lab Results  Component Value Date   NA 136 11/23/2021   K 3.9 11/23/2021   CL 105 11/23/2021   CO2 22 11/23/2021  Total bilirubin: 13.5, AST 80, ALT 39, glucose 254  RADIOGRAPHIC STUDIES: I have personally reviewed the radiological reports and agreed with the findings in the report.  ASSESSMENT AND PLAN:    Acute TTP: Based on prior history as well has thrombocytopenia with active hemolysis DAT negative and elevated bilirubin and reticulocyte count.  [name] TS 13 will be sent.  Plan is for the patient to get pheresis catheter implanted around 9:00 this morning.  I requested the dialysis team to start pheresis.  Blood bank has also been informed and will  be ready with plasma.  There are some staffing issues with dialysis and they are trying to work on a plan. He will need daily plasmapheresis and daily monitoring of LDH levels. Once his LDH levels come down then he could be set up as an outpatient for plasma exchange 2-3 times a week. Jaundice: Related to hemolysis: It should start to come down once the hemolysis starts to get better. Diabetes: Being managed by family medicine.  I will discuss with Dr. Learta Codding to follow the patient  from tomorrow.  All questions were answered. The patient knows to call the clinic with any problems, questions or concerns.    Harriette Ohara, MD

## 2021-11-23 NOTE — ED Notes (Signed)
RN spoke with Dialysis RN regarding pt TPE treatment. Dialysis RN needs a Ca Ionized in order to infuse. Order placed for Ca Ionized to be completed. MD made aware.

## 2021-11-23 NOTE — Progress Notes (Addendum)
     Daily Progress Note Intern Pager: [phone]  Patient name: Eric Hooper Medical record number: [id number] Date of birth: [birth year] Age: 46 y.o. Gender: male  Primary Care Provider: Rise Patience, DO Consultants: Heme-Onc, IR  Code Status: Full   Pt Overview and Major Events to Date:  7/29 Admitted   Assessment and Plan:  * TTP (thrombotic thrombocytopenic purpura) (HCC) Platelet count <5 this am. LDH 1687.  IR will place apheresis catheter this morning and Heme-onc to perform plasmapheresis.  -Heme-onc following, appreciate recommendations - Avoid NSAID use - AM CBC, CMP - SCDs  Acute cystitis with hematuria Asymptomatic.  CT abdomen showing bilateral perinephric soft tissue stranding, with extension into the pelvis.  No hydronephrosis. Was given 1 dose of CTX yesterday.  - given he is asymptomatic, will hold abx for now   Type 2 diabetes mellitus without complications (HCC) Am CBG 254. Likely will also be increased in the setting of steroids given  - Sensitive sliding scale insulin - Monitor CBGs  Hypertension BP 144/83. Home medication: Amlodipine '5mg'$ , Losartan '50mg'$  - continue home meds     FEN/GI: npo PPx: SCDs  Dispo:Home tomorrow. Barriers include continued treatment.   Subjective:  This morning when I came into the room, patient was sleeping, and he was bleeding from IV site, blood covering gown. I let RN know and she said he had pulled his IV out while he was sleeping. Bleeding able to be controlled. Patient stated he was feeling well, denied lightheadedness, nausea or pain.   Objective: Temp:  [97.6 F (36.4 C)-98.4 F (36.9 C)] 98.1 F (36.7 C) (07/30 0330) Pulse Rate:  [59-90] 63 (07/30 0600) Resp:  [14-24] 21 (07/30 0600) BP: (141-193)/(83-119) 157/101 (07/30 0600) SpO2:  [95 %-99 %] 98 % (07/30 0600) Weight:  [81.6 kg] 81.6 kg (07/29 2102) Physical Exam: General: alert, bleeding from IV site, NAD  Cardiovascular: RRR no  murmurs Respiratory: CTAB normal WOB Abdomen: soft, non distended  Extremities: warm, dry. No Le edema  Laboratory: Most recent CBC Lab Results  Component Value Date   WBC 8.0 11/23/2021   HGB 12.7 (L) 11/23/2021   HCT 34.6 (L) 11/23/2021   MCV 89.9 11/23/2021   PLT <5 (LL) 11/23/2021   Most recent BMP    Latest Ref Rng & Units 11/23/2021    3:36 AM  BMP  Glucose 70 - 99 mg/dL 254   BUN 6 - 20 mg/dL 30   Creatinine 0.61 - 1.24 mg/dL 1.12   Sodium 135 - 145 mmol/L 136   Potassium 3.5 - 5.1 mmol/L 3.9   Chloride 98 - 111 mmol/L 105   CO2 22 - 32 mmol/L 22   Calcium 8.9 - 10.3 mg/dL 9.5     Imaging/Diagnostic Tests: None new   Shary Key, DO 11/23/2021, 6:47 AM  PGY-3, [place] Intern pager: [phone], text pages welcome Secure chat group [place] [facility]

## 2021-11-23 NOTE — ED Notes (Signed)
IV accidentally removed by patient during sleep, woke up to reposition and felt gown wet and arm.   Admitting provider walked in during this time and noticed bleeding. Notified RN  Patient changed into a clean gown.

## 2021-11-23 NOTE — Assessment & Plan Note (Deleted)
Platelet and Hgb improved to 131 and 8 respectively. LDH has improved to stable levels at 187. IR to place tunnel Catheter today for outpatient PLEX. - Heme-onc following, appreciate recommendations - Daily plasmapheresis w/ FFP replacement (day 6 of 7) -Decreased prednisone to '60mg'$  daily - Continue protonix '20mg'$  daily  -Continue daily folic acid, per Heme - Monitor LDH levels daily  - Start Bactrium '160mg'$  3x/weekly  - Avoid NSAID use - SCDs -Consider starting rituximab outpatient

## 2021-11-24 ENCOUNTER — Telehealth: Payer: Self-pay

## 2021-11-24 DIAGNOSIS — M3119 Other thrombotic microangiopathy: Secondary | ICD-10-CM | POA: Diagnosis not present

## 2021-11-24 LAB — THERAPEUTIC PLASMA EXCHANGE (BLOOD BANK)
Plasma Exchange: 3258
Plasma volume needed: 3258
Unit division: 0
Unit division: 0
Unit division: 0
Unit division: 0
Unit division: 0
Unit division: 0
Unit division: 0
Unit division: 0
Unit division: 0
Unit division: 0

## 2021-11-24 LAB — PREPARE FRESH FROZEN PLASMA
Unit division: 0
Unit division: 0

## 2021-11-24 LAB — BASIC METABOLIC PANEL
Anion gap: 8 (ref 5–15)
BUN: 29 mg/dL — ABNORMAL HIGH (ref 6–20)
CO2: 29 mmol/L (ref 22–32)
Calcium: 8.7 mg/dL — ABNORMAL LOW (ref 8.9–10.3)
Chloride: 104 mmol/L (ref 98–111)
Creatinine, Ser: 1.13 mg/dL (ref 0.61–1.24)
GFR, Estimated: >60 mL/min (ref >60)
Glucose, Bld: 194 mg/dL — ABNORMAL HIGH (ref 70–99)
Potassium: 3.4 mmol/L — ABNORMAL LOW (ref 3.5–5.1)
Sodium: 141 mmol/L (ref 135–145)

## 2021-11-24 LAB — COMPREHENSIVE METABOLIC PANEL
ALT: 27 U/L (ref 0–44)
AST: 44 U/L — ABNORMAL HIGH (ref 15–41)
Albumin: 3.1 g/dL — ABNORMAL LOW (ref 3.5–5.0)
Alkaline Phosphatase: 44 U/L (ref 38–126)
Anion gap: 7 (ref 5–15)
BUN: 36 mg/dL — ABNORMAL HIGH (ref 6–20)
CO2: 30 mmol/L (ref 22–32)
Calcium: 8.8 mg/dL — ABNORMAL LOW (ref 8.9–10.3)
Chloride: 104 mmol/L (ref 98–111)
Creatinine, Ser: 1.28 mg/dL — ABNORMAL HIGH (ref 0.61–1.24)
GFR, Estimated: >60 mL/min (ref >60)
Glucose, Bld: 160 mg/dL — ABNORMAL HIGH (ref 70–99)
Potassium: 3.3 mmol/L — ABNORMAL LOW (ref 3.5–5.1)
Sodium: 141 mmol/L (ref 135–145)
Total Bilirubin: 8.5 mg/dL — ABNORMAL HIGH (ref 0.3–1.2)
Total Protein: 5.3 g/dL — ABNORMAL LOW (ref 6.5–8.1)

## 2021-11-24 LAB — CBC WITH DIFFERENTIAL/PLATELET
Abs Immature Granulocytes: 0.07 10*3/uL (ref 0.00–0.07)
Basophils Absolute: 0 10*3/uL (ref 0.0–0.1)
Basophils Relative: 0 %
Eosinophils Absolute: 0.2 10*3/uL (ref 0.0–0.5)
Eosinophils Relative: 1 %
HCT: 27.9 % — ABNORMAL LOW (ref 39.0–52.0)
Hemoglobin: 9.9 g/dL — ABNORMAL LOW (ref 13.0–17.0)
Immature Granulocytes: 1 %
Lymphocytes Relative: 46 %
Lymphs Abs: 6.7 10*3/uL — ABNORMAL HIGH (ref 0.7–4.0)
MCH: 32.4 pg (ref 26.0–34.0)
MCHC: 35.5 g/dL (ref 30.0–36.0)
MCV: 91.2 fL (ref 80.0–100.0)
Monocytes Absolute: 1.4 10*3/uL — ABNORMAL HIGH (ref 0.1–1.0)
Monocytes Relative: 10 %
Neutro Abs: 6.1 10*3/uL (ref 1.7–7.7)
Neutrophils Relative %: 42 %
Platelets: 6 10*3/uL — CL (ref 150–400)
RBC: 3.06 MIL/uL — ABNORMAL LOW (ref 4.22–5.81)
RDW: 16 % — ABNORMAL HIGH (ref 11.5–15.5)
WBC: 14.5 10*3/uL — ABNORMAL HIGH (ref 4.0–10.5)
nRBC: 0.8 % — ABNORMAL HIGH (ref 0.0–0.2)

## 2021-11-24 LAB — BPAM FFP
Blood Product Expiration Date: 202307312359
Blood Product Expiration Date: 202308012359
ISSUE DATE / TIME: 202307300620
ISSUE DATE / TIME: 202307300620
Unit Type and Rh: 600
Unit Type and Rh: 6200

## 2021-11-24 LAB — HAPTOGLOBIN: Haptoglobin: 10 mg/dL — ABNORMAL LOW (ref 23–355)

## 2021-11-24 LAB — GLUCOSE, CAPILLARY
Glucose-Capillary: 162 mg/dL — ABNORMAL HIGH (ref 70–99)
Glucose-Capillary: 158 mg/dL — ABNORMAL HIGH (ref 70–99)
Glucose-Capillary: 160 mg/dL — ABNORMAL HIGH (ref 70–99)
Glucose-Capillary: 252 mg/dL — ABNORMAL HIGH (ref 70–99)

## 2021-11-24 LAB — PATHOLOGIST SMEAR REVIEW

## 2021-11-24 LAB — LACTATE DEHYDROGENASE: LDH: 1012 U/L — ABNORMAL HIGH (ref 98–192)

## 2021-11-24 MED ORDER — CALCIUM CARBONATE ANTACID 500 MG PO CHEW
CHEWABLE_TABLET | ORAL | Status: AC
  Filled 2021-11-24: qty 2

## 2021-11-24 MED ORDER — CALCIUM GLUCONATE-NACL 2-0.675 GM/100ML-% IV SOLN
INTRAVENOUS | Status: AC
  Filled 2021-11-24: qty 100

## 2021-11-24 MED ORDER — ACETAMINOPHEN 325 MG PO TABS
ORAL_TABLET | ORAL | Status: AC
  Filled 2021-11-24: qty 2

## 2021-11-24 MED ORDER — ACD FORMULA A 0.73-2.45-2.2 GM/100ML VI SOLN
Status: AC
  Filled 2021-11-24: qty 1000

## 2021-11-24 MED ORDER — ACETAMINOPHEN 325 MG PO TABS
650.0000 mg | ORAL_TABLET | ORAL | Status: DC | PRN
  Administered 2021-11-24: 650 mg via ORAL

## 2021-11-24 MED ORDER — POTASSIUM CHLORIDE 20 MEQ PO PACK
40.0000 meq | PACK | Freq: Once | ORAL | Status: AC
  Administered 2021-11-24: 40 meq via ORAL
  Filled 2021-11-24: qty 2

## 2021-11-24 MED ORDER — CALCIUM CARBONATE ANTACID 500 MG PO CHEW
2.0000 | CHEWABLE_TABLET | ORAL | Status: AC
  Administered 2021-11-24: 400 mg via ORAL

## 2021-11-24 MED ORDER — DIPHENHYDRAMINE HCL 25 MG PO CAPS
ORAL_CAPSULE | ORAL | Status: AC
  Filled 2021-11-24: qty 1

## 2021-11-24 MED ORDER — DIPHENHYDRAMINE HCL 25 MG PO CAPS
25.0000 mg | ORAL_CAPSULE | Freq: Four times a day (QID) | ORAL | Status: DC | PRN
  Administered 2021-11-24: 25 mg via ORAL

## 2021-11-24 MED ORDER — ANTICOAGULANT SODIUM CITRATE 4% (200MG/5ML) IV SOLN
5.0000 mL | Freq: Once | Status: AC
  Administered 2021-11-24: 5 mL
  Filled 2021-11-24 (×2): qty 5

## 2021-11-24 MED ORDER — CALCIUM GLUCONATE-NACL 2-0.675 GM/100ML-% IV SOLN
2.0000 g | Freq: Once | INTRAVENOUS | Status: AC
  Administered 2021-11-24: 2000 mg via INTRAVENOUS
  Filled 2021-11-24: qty 100

## 2021-11-24 MED ORDER — POTASSIUM CHLORIDE CRYS ER 20 MEQ PO TBCR
40.0000 meq | EXTENDED_RELEASE_TABLET | Freq: Once | ORAL | Status: AC
  Administered 2021-11-24: 40 meq via ORAL
  Filled 2021-11-24: qty 2

## 2021-11-24 MED ORDER — ACD FORMULA A 0.73-2.45-2.2 GM/100ML VI SOLN
1000.0000 mL | Status: DC
Start: 2021-11-24 — End: 2021-11-24
  Administered 2021-11-24: 1000 mL
  Filled 2021-11-24: qty 1000

## 2021-11-24 MED ORDER — SODIUM CHLORIDE 0.9% FLUSH
10.0000 mL | INTRAVENOUS | Status: DC | PRN
  Administered 2021-11-24 – 2021-11-25 (×2): 10 mL

## 2021-11-24 MED ORDER — CHLORHEXIDINE GLUCONATE CLOTH 2 % EX PADS
6.0000 | MEDICATED_PAD | Freq: Every day | CUTANEOUS | Status: DC
  Administered 2021-11-24 – 2021-11-29 (×5): 6 via TOPICAL

## 2021-11-24 NOTE — Progress Notes (Signed)
     Daily Progress Note Intern Pager: [phone]  Patient name: Eric Hooper Medical record number: [id number] Date of birth: 1976-02-21 Age: 46 y.o. Gender: male  Primary Care Provider: Rise Patience, DO Consultants: Nephrology, IR, HemeOnc  Code Status: Full  Pt Overview and Major Events to Date:  7/29 - Admitted  7/30 - IR placed catheter, 1st plasmapheresis session  .   Assessment and Plan: Eric Hooper is a 46 year old male with PMH significant for TTP, DM 2, who presented with hematuria found to be in TTP.  S/p plasmapheresis yesterday, still thrombocytopenic we will continue with plasmapheresis.  * TTP (thrombotic thrombocytopenic purpura) (HCC) Platelet count increased to 6. LDH 1012. S/p plasmapheresis yesterday.  S/p steroids x1. -Heme-onc following, appreciate recommendations  - Daily plasmapheresis w/ FFP replacement, monitor LDH levels daily  - Avoid NSAID use - SCDs  Acute cystitis with hematuria Continues to be asymptomatic, afebrile.  S/p Rocephin x1 - F/U urine cultures - Hold antibiotics given Rocephin effect on platelets, asymptomatic other than hematuria   Type 2 diabetes mellitus without complications (HCC) Fasting CBG 160.  (Home regimen: Metformin) - Sensitive sliding scale insulin - Monitor CBGs  Hypertension Stable. home medication: Amlodipine '5mg'$ , Losartan '50mg'$  - continue home meds   Hypokalemia K 3.3 this AM - 40 mg K repletion  - BMP daily     FEN/GI: Regular  PPx: SCDs Dispo:Home pending clinical improvement .  Subjective:  Patient says he is feeling well. Denies headache, SOB, chest pain. He notes that urine is less dark now. He denies lightheadedness.   Objective: Temp:  [97.8 F (36.6 C)-98.7 F (37.1 C)] 98 F (36.7 C) (07/31 0831) Pulse Rate:  [76-88] 78 (07/31 0831) Resp:  [16-22] 17 (07/31 0831) BP: (124-146)/(68-105) 131/76 (07/31 0831) SpO2:  [96 %-100 %] 100 % (07/31 0831) Weight:  [81 kg-89.3 kg] 89.3 kg (07/31  0500) Physical Exam: General: Well appearing sitting in bed, conversant  Eyes: Scleral icterus  Cardiovascular: RRR, cap refill <2 secs  Respiratory: Breathing comfortably on room air, CTAB Abdomen: Soft, non tender, non distended  Extremities: No BLE edema Derm: palms yellow, mildly icteric skin  Laboratory: Most recent CBC Lab Results  Component Value Date   WBC 14.5 (H) 11/24/2021   HGB 9.9 (L) 11/24/2021   HCT 27.9 (L) 11/24/2021   MCV 91.2 11/24/2021   PLT 6 (LL) 11/24/2021   Most recent BMP    Latest Ref Rng & Units 11/24/2021    3:41 AM  BMP  Glucose 70 - 99 mg/dL 160   BUN 6 - 20 mg/dL 36   Creatinine 0.61 - 1.24 mg/dL 1.28   Sodium 135 - 145 mmol/L 141   Potassium 3.5 - 5.1 mmol/L 3.3   Chloride 98 - 111 mmol/L 104   CO2 22 - 32 mmol/L 30   Calcium 8.9 - 10.3 mg/dL 8.8     Other pertinent labs LDH 1012   Lowry Ram, MD 11/24/2021, 10:39 AM  PGY-1, [place] Intern pager: [phone], text pages welcome Secure chat group [facility]

## 2021-11-24 NOTE — Telephone Encounter (Signed)
Eric Hooper called the telephone advice nurse states her husband's platelets are low again. They are at the hospital now. They are down to 6. She would like to page the doctor. Patient is currently in [place] receiving IV txt for UTI after presenting for hematuria, dizziness, and difficulty swallowing. States his lab results in Ed showed low platelets and as he was previously a patient of his office for thrombocytopenia having received several plasma transfusions over time-she was calling to notify Dr. Benay Spice specifically about his low platelets. Dr Benay Spice is aware.

## 2021-11-24 NOTE — Progress Notes (Signed)
[facility] Progress Note   46 y.o. year-old w/ hx of DM2, TTP, HTN, HL, smoker who presented to ED w/ new onset gross hematuria. Pt has a hx of TTP and was treated here back in 2019 and in 2020 under care of Dr. Benay Spice. Labs here showed plt ct of 6, Hb 13.5, LDH 1687 and total bilirubin 12.7 (indirect 11.5). Pt seen by Dr. Lindi Adie of Hem-Onc who has ordered plasamapheresis w/ FFP x 7 days.     Assessment/ Plan:   Acute TTP - recurrent, per Dr Lindi Adie needs plasmapheresis as above. Tolerated 1st TPE yest, #2 today for total of 7 treatments. Monitor platelet levels and LDH. RIJ temp 7/30. DM2 - per pmd HTN - per pmd  Subjective:   Urine cleared up; denies dysuria, fever, chills, h/a, sob.   Objective:   BP 131/76 (BP Location: Left Arm)   Pulse 78   Temp 98 F (36.7 C) (Oral)   Resp 17   Ht '5\' 10"'$  (1.778 m)   Wt 89.3 kg   SpO2 100%   BMI 28.25 kg/m   Intake/Output Summary (Last 24 hours) at 11/24/2021 1235 Last data filed at 11/24/2021 0900 Gross per 24 hour  Intake 240 ml  Output --  Net 240 ml   Weight change: -0.648 kg  Physical Exam: Gen alert, no distress No rash, cyanosis or gangrene No jvd or bruits Chest clear bilat to bases RRR no MRG Abd soft ntnd no mass or ascites +bs Ext no LE or UE edema, no wounds or ulcers Neuro is alert, Ox 3 , nf  Imaging: IR Fluoro Guide CV Line Right  Result Date: 11/23/2021 INDICATION: TTP requiring plasmapheresis EXAM: Temporary dialysis catheter placement MEDICATIONS: None ANESTHESIA/SEDATION: None FLUOROSCOPY: Radiation Exposure Index (as provided by the fluoroscopic device): 1 mGy Kerma COMPLICATIONS: None immediate. PROCEDURE: Informed written consent was obtained from the patient after a thorough discussion of the procedural risks, benefits and alternatives. All questions were addressed. Maximal Sterile Barrier Technique was utilized including caps, mask, sterile gowns, sterile gloves, sterile drape, hand  hygiene and skin antiseptic. A timeout was performed prior to the initiation of the procedure. Right neck prepped and draped in the usual sterile fashion. All elements of maximal sterile barrier were utilized including, cap, mask, sterile gown, sterile gloves, large sterile drape, hand scrubbing and 2% Chlorhexidine for skin cleaning. The right internal jugular vein was evaluated with ultrasound and shown to be patent. A permanent ultrasound image was obtained and placed in the patient's medical record. Using sterile gel and a sterile probe cover, the right internal jugular vein was entered with a 21 ga needle during real time ultrasound guidance. 0.018 inch guidewire placed and 21 ga needle exchanged for transitional dilator set. Utilizing fluoroscopy, 0.035 inch guidewire advanced through the transitional dilator the level of the IVC without difficulty. Serial dilation performed, and catheter inserted over the guidewire. The tip was positioned in the right atrium. All lumens of the catheter aspirated and flushed well. The dialysis lumens were locked with Heparin. The catheter was secured to the skin with suture. The insertion site was covered with sterile dressing. IMPRESSION: 15 cm temporary right IJ Trialysis catheter is ready for use. Electronically Signed   By: Miachel Roux M.D.   On: 11/23/2021 09:51   IR US Guide Vasc Access Right  Result Date: 11/23/2021 INDICATION: TTP requiring plasmapheresis EXAM: Temporary dialysis catheter placement MEDICATIONS: None ANESTHESIA/SEDATION: None FLUOROSCOPY: Radiation Exposure Index (as provided by the fluoroscopic device):  1 mGy Kerma COMPLICATIONS: None immediate. PROCEDURE: Informed written consent was obtained from the patient after a thorough discussion of the procedural risks, benefits and alternatives. All questions were addressed. Maximal Sterile Barrier Technique was utilized including caps, mask, sterile gowns, sterile gloves, sterile drape, hand hygiene and  skin antiseptic. A timeout was performed prior to the initiation of the procedure. Right neck prepped and draped in the usual sterile fashion. All elements of maximal sterile barrier were utilized including, cap, mask, sterile gown, sterile gloves, large sterile drape, hand scrubbing and 2% Chlorhexidine for skin cleaning. The right internal jugular vein was evaluated with ultrasound and shown to be patent. A permanent ultrasound image was obtained and placed in the patient's medical record. Using sterile gel and a sterile probe cover, the right internal jugular vein was entered with a 21 ga needle during real time ultrasound guidance. 0.018 inch guidewire placed and 21 ga needle exchanged for transitional dilator set. Utilizing fluoroscopy, 0.035 inch guidewire advanced through the transitional dilator the level of the IVC without difficulty. Serial dilation performed, and catheter inserted over the guidewire. The tip was positioned in the right atrium. All lumens of the catheter aspirated and flushed well. The dialysis lumens were locked with Heparin. The catheter was secured to the skin with suture. The insertion site was covered with sterile dressing. IMPRESSION: 15 cm temporary right IJ Trialysis catheter is ready for use. Electronically Signed   By: Miachel Roux M.D.   On: 11/23/2021 09:51   CT Abdomen Pelvis W Contrast  Result Date: 11/22/2021 CLINICAL DATA:  Acute abdominal pain.  Hematuria. EXAM: CT ABDOMEN AND PELVIS WITH CONTRAST TECHNIQUE: Multidetector CT imaging of the abdomen and pelvis was performed using the standard protocol following bolus administration of intravenous contrast. RADIATION DOSE REDUCTION: This exam was performed according to the departmental dose-optimization program which includes automated exposure control, adjustment of the mA and/or kV according to patient size and/or use of iterative reconstruction technique. CONTRAST:  117m OMNIPAQUE IOHEXOL 300 MG/ML  SOLN COMPARISON:   Noncontrast CT on 12/21/2017 FINDINGS: Lower Chest: No acute findings. Hepatobiliary: No hepatic masses identified. Gallbladder is unremarkable. No evidence of biliary ductal dilatation. Pancreas:  No mass or inflammatory changes. Spleen: Within normal limits in size and appearance. Adrenals/Urinary Tract: No evidence of ureteral calculi or hydronephrosis. Symmetric perinephric soft tissue stranding is seen bilaterally with extension inferiorly into pelvis. No renal masses or other parenchymal lesions are identified. Urinary bladder is unremarkable. Stomach/Bowel: No evidence of obstruction, inflammatory process or abnormal fluid collections. Vascular/Lymphatic: No pathologically enlarged lymph nodes. No acute vascular findings. Reproductive:  No mass or other significant abnormality. Other:  None. Musculoskeletal:  No suspicious bone lesions identified. IMPRESSION: Bilateral perinephric soft tissue stranding, with extension into pelvis. No evidence of hydronephrosis. This is nonspecific, and differential diagnosis includes bilateral pyelonephritis, vesicoureteral reflux, and medical renal disease. Recommend correlation with urinalysis. Electronically Signed   By: JMarlaine HindM.D.   On: 11/22/2021 18:56    Labs: BMET Recent Labs  Lab 11/22/21 1544 11/23/21 0336 11/24/21 0341  NA 140 136 141  K 3.4* 3.9 3.3*  CL 107 105 104  CO2 '25 22 30  '$ GLUCOSE 171* 254* 160*  BUN 22* 30* 36*  CREATININE 1.08 1.12 1.28*  CALCIUM 9.7 9.5 8.8*   CBC Recent Labs  Lab 11/22/21 1544 11/22/21 1710 11/22/21 2010 11/23/21 0336 11/24/21 0341  WBC 11.2*  --  10.5 8.0 14.5*  NEUTROABS  --   --  4.4 5.8  6.1  HGB 13.5  --  12.9* 12.7* 9.9*  HCT 38.5*  --  35.8* 34.6* 27.9*  MCV 92.3  --  90.9 89.9 91.2  PLT 6* 5* 5* <5* 6*    Medications:     sodium chloride   Intravenous Once   acetaminophen       amLODipine  5 mg Oral Daily   atorvastatin  40 mg Oral Daily   calcium carbonate       calcium carbonate   2 tablet Oral Q3H   Chlorhexidine Gluconate Cloth  6 each Topical Daily   diphenhydrAMINE       insulin aspart  0-9 Units Subcutaneous TID WC   losartan  50 mg Oral Daily      [name], MD 11/24/2021, 12:35 PM

## 2021-11-24 NOTE — Assessment & Plan Note (Deleted)
K 3.3 this AM - 80 mg K repletion  - BMP daily

## 2021-11-24 NOTE — Hospital Course (Addendum)
Eric Hooper  is a 46 year old male with past medical history significant for type 2 diabetes, HTN, HLD, TTP episodes x2 who presented with hematuria and dizziness and was found to be in TTP.  Thrombotic thrombocytopenic purpura Patient presented with 1 day of hematuria and dizziness.  On admission patient was severely thrombocytopenic.  He had been taking Aleve twice a day.  Patient was given steroids.  Hematology was consulted and catheter was placed by IR for plasmapheresis.  Patient had plasmapheresis with supplemental FFP starting 11/23/2021.  LDH was elevated.  Plasmapheresis was continued for 7 days with last day on 8/5  ***. Patient was eventually started on folic acid and  80 mg prednisone daily. Prednisone was tapered to 70 mg at the time of discharge with plan for heme-onc to continue tapering outpatient. Patient had a tunnel catheter placed on 8/4 to continue plasmapheresis outpatient.  Hematuria Patient presented with hematuria and denies dysuria, frequency, urgency.  Urine culture was negative. Patient received 1 dose of Rocephin which was discontinued as patient was afebrile, asymptomatic.  Hematuria continue to decrease and was***by discharge.  Hyperglycemia Patient with history of diabetes on metformin was managed for hyperglycemia during hospitalization. Give his resent initiation of steroid treatment he had subsequent hyper glycemia. His hyperglycemia was managed with home metformin and ***   Items to Follow Up:  Outpatient plasmapheresis 2-3 times per week. Monitor LDH and platelet levels.  PCP to monitor CMP, if LFTs remain elevated after TTP resolves, obtain hepatic panel.  Started on '160mg'$  Bactrium  PCP prophylaxis if on 20 mg of steroids or more for 30 days. Steroid start date 8/1. Will need close monitoring of his potassium outpatient given steroid treatment. Started on daily 20 mg potassium daily until he is completely off of steroid treatment.

## 2021-11-24 NOTE — Progress Notes (Signed)
HEMATOLOGY-ONCOLOGY PROGRESS NOTE  ASSESSMENT AND PLAN: .  TTP initially diagnosed in October 2018 with recurrence in April 2020 and July 2023. Initiation of daily plasma exchange and Solu-Medrol 02/02/2017 [name] Antibody- 72, activity less than 2% on initial presentation; [name] from 08/15/2018 <2.0. Last plasma exchange 02/06/2017 Prednisone 60 mg daily beginning 02/08/2017 Prednisone 40 mg daily beginning 02/17/2017 Prednisone 30 mg daily beginning 03/09/2017 Prednisone 20 mg daily beginning 03/23/2017 Prednisone 10 mg daily beginning 04/01/2017 Prednisone 5 mg daily for 5 days then 5 mg every other day for 5 doses then stop beginning 05/11/2017 Recurrent TTP 08/14/2018 Plasma exchange/steroids started on 08/15/2018.  Plasma exchange discontinued after treatment on 08/19/2019, discharged on prednisone '60mg'$  daily Prednisone taper to 40 mg daily 08/22/2018 Weekly rituximab for 4 doses beginning 08/25/2018 Plasma exchange resumed 08/26/2018, 08/27/2018, 08/28/2018, 08/29/2018, 08/31/2018, 09/02/2018 Prednisone taper to 30 mg daily 09/01/2018 Cycle 3 weekly Rituxan 09/08/2018 Prednisone taper to 20 mg daily 09/08/2018 Cycle 4 weekly Rituxan 09/15/2018 Prednisone taper to 10 mg daily 09/20/2018 Prednisone taper to 5 mg daily 09/27/2018 Prednisone discontinued 10/07/2018 Plasma exchange initiated 11/23/2021 2. Xiphoid pain-etiology unclear 3. Hypertension 4. Diabetes 5. Alcohol/Tobacco use 6. History of renal insufficiency 7. Right IJ dialysis catheter placed 02/02/2017, removed New right IJ dialysis catheter 08/15/2018 Catheter removed 09/12/2018 New right IJ dialysis catheter placed 11/23/2021  Eric Hooper has been admitted for current TTP.  He was started on plasma exchange on 11/23/2021.  Labs from today have been reviewed.  Hemoglobin down today following initiation of plasma exchange which is not unexpected.  We will continue to monitor this.  Platelets are 6000 today.  He reports a small amount of blood  in his urine but no significant bleeding.  Would not transfuse platelets unless active bleeding.  T. bili and LDH are trending downward today.  Recommend continuation of plasma exchange daily.  Currently, we have him scheduled for 7 days but will adjust schedule as needed.  An ADAMTS 13 level was ordered on admission.  However, this was not collected.  I spoke with the lab and this lab needs to be sent in a blue top tube.  A blue top tube was only good for 8 hours therefore specimen obtained over the weekend is no longer able to be sent.  Will not order today as results will be skewed given that he has started plasma exchange already.  Recommendations: 1.  Continue daily plasma exchange. 2.  Monitor CBC, CMP, LDH daily.  [name]   SUBJECTIVE: The patient is well-known to our service.  He has been treated in the past for TTP.  Now admitted with recurrent TTP.  The patient was seen today during plasma exchange.  He states that he had some lightheadedness on the day of admission.  Otherwise, he felt well overall.  Today, he reports that he is not having any lightheadedness or dizziness.  Notes a small amount of blood in his urine but otherwise no bleeding.  States that urine was very dark yesterday but is lighter in color today.  Denies headaches, abdominal pain, nausea, vomiting.   PHYSICAL EXAMINATION:  Vitals:   11/23/21 2325 11/24/21 0831  BP: 134/90 131/76  Pulse: 77 78  Resp: 16 17  Temp: 98.2 F (36.8 C) 98 F (36.7 C)  SpO2: 100% 100%   Filed Weights   11/22/21 2102 11/23/21 1401 11/24/21 0500  Weight: 81.6 kg 81 kg 89.3 kg    Intake/Output from previous day: No intake/output data recorded.  Physical Exam  Vitals reviewed.  Constitutional:      General: He is not in acute distress. HENT:     Head: Normocephalic.  Eyes:     General: Scleral icterus present.  Pulmonary:     Effort: Pulmonary effort is normal. No respiratory distress.  Skin:    General: Skin is  warm and dry.  Neurological:     Mental Status: He is alert and oriented to person, place, and time.     LABORATORY DATA:  I have reviewed the data as listed    Latest Ref Rng & Units 11/24/2021    3:41 AM 11/23/2021    3:36 AM 11/22/2021    9:35 PM  CMP  Glucose 70 - 99 mg/dL 160  254    BUN 6 - 20 mg/dL 36  30    Creatinine 0.61 - 1.24 mg/dL 1.28  1.12    Sodium 135 - 145 mmol/L 141  136    Potassium 3.5 - 5.1 mmol/L 3.3  3.9    Chloride 98 - 111 mmol/L 104  105    CO2 22 - 32 mmol/L 30  22    Calcium 8.9 - 10.3 mg/dL 8.8  9.5    Total Protein 6.5 - 8.1 g/dL 5.3  6.0  6.0   Total Bilirubin 0.3 - 1.2 mg/dL 8.5  13.5  12.7   Alkaline Phos 38 - 126 U/L 44  77  70   AST 15 - 41 U/L 44  80  67   ALT 0 - 44 U/L 27  39  37     Lab Results  Component Value Date   WBC 14.5 (H) 11/24/2021   HGB 9.9 (L) 11/24/2021   HCT 27.9 (L) 11/24/2021   MCV 91.2 11/24/2021   PLT 6 (LL) 11/24/2021   NEUTROABS 6.1 11/24/2021    No results found for: "CEA1", "CEA", "LPF790", "CA125", "PSA1"  IR Fluoro Guide CV Line Right  Result Date: 11/23/2021 INDICATION: TTP requiring plasmapheresis EXAM: Temporary dialysis catheter placement MEDICATIONS: None ANESTHESIA/SEDATION: None FLUOROSCOPY: Radiation Exposure Index (as provided by the fluoroscopic device): 1 mGy Kerma COMPLICATIONS: None immediate. PROCEDURE: Informed written consent was obtained from the patient after a thorough discussion of the procedural risks, benefits and alternatives. All questions were addressed. Maximal Sterile Barrier Technique was utilized including caps, mask, sterile gowns, sterile gloves, sterile drape, hand hygiene and skin antiseptic. A timeout was performed prior to the initiation of the procedure. Right neck prepped and draped in the usual sterile fashion. All elements of maximal sterile barrier were utilized including, cap, mask, sterile gown, sterile gloves, large sterile drape, hand scrubbing and 2% Chlorhexidine for  skin cleaning. The right internal jugular vein was evaluated with ultrasound and shown to be patent. A permanent ultrasound image was obtained and placed in the patient's medical record. Using sterile gel and a sterile probe cover, the right internal jugular vein was entered with a 21 ga needle during real time ultrasound guidance. 0.018 inch guidewire placed and 21 ga needle exchanged for transitional dilator set. Utilizing fluoroscopy, 0.035 inch guidewire advanced through the transitional dilator the level of the IVC without difficulty. Serial dilation performed, and catheter inserted over the guidewire. The tip was positioned in the right atrium. All lumens of the catheter aspirated and flushed well. The dialysis lumens were locked with Heparin. The catheter was secured to the skin with suture. The insertion site was covered with sterile dressing. IMPRESSION: 15 cm temporary right IJ Trialysis catheter is ready for  use. Electronically Signed   By: Miachel Roux M.D.   On: 11/23/2021 09:51   IR US Guide Vasc Access Right  Result Date: 11/23/2021 INDICATION: TTP requiring plasmapheresis EXAM: Temporary dialysis catheter placement MEDICATIONS: None ANESTHESIA/SEDATION: None FLUOROSCOPY: Radiation Exposure Index (as provided by the fluoroscopic device): 1 mGy Kerma COMPLICATIONS: None immediate. PROCEDURE: Informed written consent was obtained from the patient after a thorough discussion of the procedural risks, benefits and alternatives. All questions were addressed. Maximal Sterile Barrier Technique was utilized including caps, mask, sterile gowns, sterile gloves, sterile drape, hand hygiene and skin antiseptic. A timeout was performed prior to the initiation of the procedure. Right neck prepped and draped in the usual sterile fashion. All elements of maximal sterile barrier were utilized including, cap, mask, sterile gown, sterile gloves, large sterile drape, hand scrubbing and 2% Chlorhexidine for skin  cleaning. The right internal jugular vein was evaluated with ultrasound and shown to be patent. A permanent ultrasound image was obtained and placed in the patient's medical record. Using sterile gel and a sterile probe cover, the right internal jugular vein was entered with a 21 ga needle during real time ultrasound guidance. 0.018 inch guidewire placed and 21 ga needle exchanged for transitional dilator set. Utilizing fluoroscopy, 0.035 inch guidewire advanced through the transitional dilator the level of the IVC without difficulty. Serial dilation performed, and catheter inserted over the guidewire. The tip was positioned in the right atrium. All lumens of the catheter aspirated and flushed well. The dialysis lumens were locked with Heparin. The catheter was secured to the skin with suture. The insertion site was covered with sterile dressing. IMPRESSION: 15 cm temporary right IJ Trialysis catheter is ready for use. Electronically Signed   By: Miachel Roux M.D.   On: 11/23/2021 09:51   CT Abdomen Pelvis W Contrast  Result Date: 11/22/2021 CLINICAL DATA:  Acute abdominal pain.  Hematuria. EXAM: CT ABDOMEN AND PELVIS WITH CONTRAST TECHNIQUE: Multidetector CT imaging of the abdomen and pelvis was performed using the standard protocol following bolus administration of intravenous contrast. RADIATION DOSE REDUCTION: This exam was performed according to the departmental dose-optimization program which includes automated exposure control, adjustment of the mA and/or kV according to patient size and/or use of iterative reconstruction technique. CONTRAST:  188m OMNIPAQUE IOHEXOL 300 MG/ML  SOLN COMPARISON:  Noncontrast CT on 12/21/2017 FINDINGS: Lower Chest: No acute findings. Hepatobiliary: No hepatic masses identified. Gallbladder is unremarkable. No evidence of biliary ductal dilatation. Pancreas:  No mass or inflammatory changes. Spleen: Within normal limits in size and appearance. Adrenals/Urinary Tract: No  evidence of ureteral calculi or hydronephrosis. Symmetric perinephric soft tissue stranding is seen bilaterally with extension inferiorly into pelvis. No renal masses or other parenchymal lesions are identified. Urinary bladder is unremarkable. Stomach/Bowel: No evidence of obstruction, inflammatory process or abnormal fluid collections. Vascular/Lymphatic: No pathologically enlarged lymph nodes. No acute vascular findings. Reproductive:  No mass or other significant abnormality. Other:  None. Musculoskeletal:  No suspicious bone lesions identified. IMPRESSION: Bilateral perinephric soft tissue stranding, with extension into pelvis. No evidence of hydronephrosis. This is nonspecific, and differential diagnosis includes bilateral pyelonephritis, vesicoureteral reflux, and medical renal disease. Recommend correlation with urinalysis. Electronically Signed   By: JMarlaine HindM.D.   On: 11/22/2021 18:56     No future appointments.    LOS: 1 day

## 2021-11-25 DIAGNOSIS — I10 Essential (primary) hypertension: Secondary | ICD-10-CM | POA: Diagnosis not present

## 2021-11-25 DIAGNOSIS — D696 Thrombocytopenia, unspecified: Secondary | ICD-10-CM | POA: Diagnosis not present

## 2021-11-25 DIAGNOSIS — M3119 Other thrombotic microangiopathy: Secondary | ICD-10-CM | POA: Diagnosis not present

## 2021-11-25 LAB — THERAPEUTIC PLASMA EXCHANGE (BLOOD BANK)
Plasma Exchange: 3600
Plasma volume needed: 3600
Unit division: 0
Unit division: 0
Unit division: 0
Unit division: 0
Unit division: 0
Unit division: 0
Unit division: 0
Unit division: 0
Unit division: 0
Unit division: 0
Unit division: 0

## 2021-11-25 LAB — COMPREHENSIVE METABOLIC PANEL
ALT: 30 U/L (ref 0–44)
AST: 36 U/L (ref 15–41)
Albumin: 3.2 g/dL — ABNORMAL LOW (ref 3.5–5.0)
Alkaline Phosphatase: 45 U/L (ref 38–126)
Anion gap: 5 (ref 5–15)
BUN: 19 mg/dL (ref 6–20)
CO2: 30 mmol/L (ref 22–32)
Calcium: 8.6 mg/dL — ABNORMAL LOW (ref 8.9–10.3)
Chloride: 107 mmol/L (ref 98–111)
Creatinine, Ser: 1 mg/dL (ref 0.61–1.24)
GFR, Estimated: >60 mL/min (ref >60)
Glucose, Bld: 137 mg/dL — ABNORMAL HIGH (ref 70–99)
Potassium: 3.3 mmol/L — ABNORMAL LOW (ref 3.5–5.1)
Sodium: 142 mmol/L (ref 135–145)
Total Bilirubin: 4.4 mg/dL — ABNORMAL HIGH (ref 0.3–1.2)
Total Protein: 5.3 g/dL — ABNORMAL LOW (ref 6.5–8.1)

## 2021-11-25 LAB — GLUCOSE, CAPILLARY
Glucose-Capillary: 155 mg/dL — ABNORMAL HIGH (ref 70–99)
Glucose-Capillary: 351 mg/dL — ABNORMAL HIGH (ref 70–99)
Glucose-Capillary: 285 mg/dL — ABNORMAL HIGH (ref 70–99)

## 2021-11-25 LAB — POCT I-STAT, CHEM 8
BUN: 19 mg/dL (ref 6–20)
Calcium, Ion: 1.13 mmol/L — ABNORMAL LOW (ref 1.15–1.40)
Chloride: 100 mmol/L (ref 98–111)
Creatinine, Ser: 1 mg/dL (ref 0.61–1.24)
Glucose, Bld: 269 mg/dL — ABNORMAL HIGH (ref 70–99)
HCT: 25 % — ABNORMAL LOW (ref 39.0–52.0)
Hemoglobin: 8.5 g/dL — ABNORMAL LOW (ref 13.0–17.0)
Potassium: 4 mmol/L (ref 3.5–5.1)
Sodium: 141 mmol/L (ref 135–145)
TCO2: 26 mmol/L (ref 22–32)

## 2021-11-25 LAB — CBC
HCT: 23.8 % — ABNORMAL LOW (ref 39.0–52.0)
Hemoglobin: 8.2 g/dL — ABNORMAL LOW (ref 13.0–17.0)
MCH: 32.7 pg (ref 26.0–34.0)
MCHC: 34.5 g/dL (ref 30.0–36.0)
MCV: 94.8 fL (ref 80.0–100.0)
Platelets: 16 10*3/uL — CL (ref 150–400)
RBC: 2.51 MIL/uL — ABNORMAL LOW (ref 4.22–5.81)
RDW: 17.5 % — ABNORMAL HIGH (ref 11.5–15.5)
WBC: 12.3 10*3/uL — ABNORMAL HIGH (ref 4.0–10.5)
nRBC: 2.1 % — ABNORMAL HIGH (ref 0.0–0.2)

## 2021-11-25 LAB — URINE CULTURE: Culture: NO GROWTH

## 2021-11-25 LAB — LACTATE DEHYDROGENASE: LDH: 605 U/L — ABNORMAL HIGH (ref 98–192)

## 2021-11-25 MED ORDER — SODIUM CHLORIDE 0.9 % IV SOLN
4.0000 g | Freq: Once | INTRAVENOUS | Status: AC
  Administered 2021-11-25: 4 g via INTRAVENOUS
  Filled 2021-11-25: qty 40

## 2021-11-25 MED ORDER — ACD FORMULA A 0.73-2.45-2.2 GM/100ML VI SOLN
1000.0000 mL | Status: DC
  Administered 2021-11-25 – 2021-11-27 (×3): 1000 mL
  Filled 2021-11-25 (×4): qty 1000

## 2021-11-25 MED ORDER — ACETAMINOPHEN 325 MG PO TABS
650.0000 mg | ORAL_TABLET | ORAL | Status: DC | PRN
Start: 2021-11-25 — End: 2021-11-28
  Administered 2021-11-25 – 2021-11-28 (×4): 650 mg via ORAL
  Filled 2021-11-25 (×5): qty 2

## 2021-11-25 MED ORDER — ANTICOAGULANT SODIUM CITRATE 4% (200MG/5ML) IV SOLN
5.0000 mL | Freq: Once | Status: AC
  Administered 2021-11-25: 5 mL
  Filled 2021-11-25 (×2): qty 5

## 2021-11-25 MED ORDER — POTASSIUM CHLORIDE CRYS ER 20 MEQ PO TBCR
40.0000 meq | EXTENDED_RELEASE_TABLET | Freq: Once | ORAL | Status: AC
  Administered 2021-11-25: 40 meq via ORAL
  Filled 2021-11-25: qty 2

## 2021-11-25 MED ORDER — CALCIUM CARBONATE ANTACID 500 MG PO CHEW
CHEWABLE_TABLET | ORAL | Status: AC
  Administered 2021-11-25: 400 mg
  Filled 2021-11-25: qty 2

## 2021-11-25 MED ORDER — DIPHENHYDRAMINE HCL 25 MG PO CAPS
ORAL_CAPSULE | ORAL | Status: AC
  Administered 2021-11-25: 25 mg via ORAL
  Filled 2021-11-25: qty 1

## 2021-11-25 MED ORDER — PREDNISONE 20 MG PO TABS
80.0000 mg | ORAL_TABLET | Freq: Every day | ORAL | Status: DC
  Administered 2021-11-25 – 2021-11-28 (×4): 80 mg via ORAL
  Filled 2021-11-25 (×4): qty 4

## 2021-11-25 MED ORDER — PANTOPRAZOLE SODIUM 20 MG PO TBEC
20.0000 mg | DELAYED_RELEASE_TABLET | Freq: Every day | ORAL | Status: DC
  Administered 2021-11-25 – 2021-11-26 (×2): 20 mg via ORAL
  Filled 2021-11-25 (×2): qty 1

## 2021-11-25 MED ORDER — DIPHENHYDRAMINE HCL 25 MG PO CAPS
25.0000 mg | ORAL_CAPSULE | Freq: Four times a day (QID) | ORAL | Status: DC | PRN
Start: 2021-11-25 — End: 2021-11-28
  Administered 2021-11-26 – 2021-11-28 (×3): 25 mg via ORAL
  Filled 2021-11-25 (×4): qty 1

## 2021-11-25 MED ORDER — CALCIUM GLUCONATE-NACL 2-0.675 GM/100ML-% IV SOLN
INTRAVENOUS | Status: AC
  Filled 2021-11-25: qty 200

## 2021-11-25 NOTE — Progress Notes (Signed)
[facility] Progress Note   46 y.o. year-old w/ hx of DM2, TTP, HTN, HL, smoker who presented to ED w/ new onset gross hematuria. Pt has a hx of TTP and was treated here back in 2019 and in 2020 under care of Dr. Benay Spice. Labs here showed plt ct of 6, Hb 13.5, LDH 1687 and total bilirubin 12.7 (indirect 11.5). Pt seen by Dr. Lindi Adie of Hem-Onc who has ordered plasamapheresis w/ FFP x 7 days.     Assessment/ Plan:   Acute TTP - recurrent, per Dr Lindi Adie needs plasmapheresis as above. Tolerated 1st TPE Sun and  #2 Mon; plan is for total of 7 treatments. Monitor platelet levels and LDH. RIJ temp 7/30. He is on for #3/7 today DM2 - per pmd HTN - per pmd  Subjective:   Urine still some pink tinge but clearing; denies dysuria, fever, chills, h/a, sob.   Objective:   BP (!) 129/99 (BP Location: Left Arm)   Pulse 65   Temp 98.4 F (36.9 C) (Oral)   Resp 17   Ht '5\' 10"'$  (1.778 m)   Wt 89.3 kg   SpO2 97%   BMI 28.25 kg/m   Intake/Output Summary (Last 24 hours) at 11/25/2021 0739 Last data filed at 11/24/2021 1849 Gross per 24 hour  Intake 1715 ml  Output --  Net 1715 ml   Weight change:   Physical Exam: Gen alert, no distress No rash, cyanosis or gangrene No jvd or bruits Chest clear bilat to bases RRR no MRG Abd soft ntnd no mass or ascites +bs Ext no LE or UE edema, no wounds or ulcers Neuro is alert, Ox 3 , nf  Imaging: IR Fluoro Guide CV Line Right  Result Date: 11/23/2021 INDICATION: TTP requiring plasmapheresis EXAM: Temporary dialysis catheter placement MEDICATIONS: None ANESTHESIA/SEDATION: None FLUOROSCOPY: Radiation Exposure Index (as provided by the fluoroscopic device): 1 mGy Kerma COMPLICATIONS: None immediate. PROCEDURE: Informed written consent was obtained from the patient after a thorough discussion of the procedural risks, benefits and alternatives. All questions were addressed. Maximal Sterile Barrier Technique was utilized including caps, mask,  sterile gowns, sterile gloves, sterile drape, hand hygiene and skin antiseptic. A timeout was performed prior to the initiation of the procedure. Right neck prepped and draped in the usual sterile fashion. All elements of maximal sterile barrier were utilized including, cap, mask, sterile gown, sterile gloves, large sterile drape, hand scrubbing and 2% Chlorhexidine for skin cleaning. The right internal jugular vein was evaluated with ultrasound and shown to be patent. A permanent ultrasound image was obtained and placed in the patient's medical record. Using sterile gel and a sterile probe cover, the right internal jugular vein was entered with a 21 ga needle during real time ultrasound guidance. 0.018 inch guidewire placed and 21 ga needle exchanged for transitional dilator set. Utilizing fluoroscopy, 0.035 inch guidewire advanced through the transitional dilator the level of the IVC without difficulty. Serial dilation performed, and catheter inserted over the guidewire. The tip was positioned in the right atrium. All lumens of the catheter aspirated and flushed well. The dialysis lumens were locked with Heparin. The catheter was secured to the skin with suture. The insertion site was covered with sterile dressing. IMPRESSION: 15 cm temporary right IJ Trialysis catheter is ready for use. Electronically Signed   By: Miachel Roux M.D.   On: 11/23/2021 09:51   IR US Guide Vasc Access Right  Result Date: 11/23/2021 INDICATION: TTP requiring plasmapheresis EXAM: Temporary dialysis catheter placement  MEDICATIONS: None ANESTHESIA/SEDATION: None FLUOROSCOPY: Radiation Exposure Index (as provided by the fluoroscopic device): 1 mGy Kerma COMPLICATIONS: None immediate. PROCEDURE: Informed written consent was obtained from the patient after a thorough discussion of the procedural risks, benefits and alternatives. All questions were addressed. Maximal Sterile Barrier Technique was utilized including caps, mask, sterile  gowns, sterile gloves, sterile drape, hand hygiene and skin antiseptic. A timeout was performed prior to the initiation of the procedure. Right neck prepped and draped in the usual sterile fashion. All elements of maximal sterile barrier were utilized including, cap, mask, sterile gown, sterile gloves, large sterile drape, hand scrubbing and 2% Chlorhexidine for skin cleaning. The right internal jugular vein was evaluated with ultrasound and shown to be patent. A permanent ultrasound image was obtained and placed in the patient's medical record. Using sterile gel and a sterile probe cover, the right internal jugular vein was entered with a 21 ga needle during real time ultrasound guidance. 0.018 inch guidewire placed and 21 ga needle exchanged for transitional dilator set. Utilizing fluoroscopy, 0.035 inch guidewire advanced through the transitional dilator the level of the IVC without difficulty. Serial dilation performed, and catheter inserted over the guidewire. The tip was positioned in the right atrium. All lumens of the catheter aspirated and flushed well. The dialysis lumens were locked with Heparin. The catheter was secured to the skin with suture. The insertion site was covered with sterile dressing. IMPRESSION: 15 cm temporary right IJ Trialysis catheter is ready for use. Electronically Signed   By: Miachel Roux M.D.   On: 11/23/2021 09:51    Labs: BMET Recent Labs  Lab 11/22/21 1544 11/23/21 0336 11/24/21 0341 11/24/21 1303 11/25/21 0325  NA 140 136 141 141 142  K 3.4* 3.9 3.3* 3.4* 3.3*  CL 107 105 104 104 107  CO2 '25 22 30 29 30  '$ GLUCOSE 171* 254* 160* 194* 137*  BUN 22* 30* 36* 29* 19  CREATININE 1.08 1.12 1.28* 1.13 1.00  CALCIUM 9.7 9.5 8.8* 8.7* 8.6*   CBC Recent Labs  Lab 11/22/21 2010 11/23/21 0336 11/24/21 0341 11/25/21 0325  WBC 10.5 8.0 14.5* 12.3*  NEUTROABS 4.4 5.8 6.1  --   HGB 12.9* 12.7* 9.9* 8.2*  HCT 35.8* 34.6* 27.9* 23.8*  MCV 90.9 89.9 91.2 94.8  PLT 5*  <5* 6* 16*    Medications:     sodium chloride   Intravenous Once   amLODipine  5 mg Oral Daily   atorvastatin  40 mg Oral Daily   Chlorhexidine Gluconate Cloth  6 each Topical Daily   insulin aspart  0-9 Units Subcutaneous TID WC   losartan  50 mg Oral Daily      [name], MD 11/25/2021, 7:39 AM

## 2021-11-25 NOTE — TOC Initial Note (Addendum)
Transition of Care [facility]) - Initial/Assessment Note    Patient Details  Name: Eric Hooper MRN: [id number] Date of Birth: 04-22-1976  [facility] of [facility] CM/SW Contact:    Curlene Labrum, RN Phone Number: 11/25/2021, 2:45 PM  Clinical Narrative:                 CM attempted to meet with the patient but the patient was currently not in his Inpatient room and off the unit for Plasma exchange.  I plan to meet with the patient in the morning for substance abuse assessment and provide outpatient resources if needs.  These resources were included in the patient's discharge instructions.        Patient Goals and CMS Choice        Expected Discharge Plan and Services                                                Prior Living Arrangements/Services                       Activities of Daily Living Home Assistive Devices/Equipment: None ADL Screening (condition at time of admission) Patient's cognitive ability adequate to safely complete daily activities?: Yes Is the patient deaf or have difficulty hearing?: No Does the patient have difficulty seeing, even when wearing glasses/contacts?: No Does the patient have difficulty concentrating, remembering, or making decisions?: No Patient able to express need for assistance with ADLs?: Yes Does the patient have difficulty dressing or bathing?: No Independently performs ADLs?: Yes (appropriate for developmental age) Does the patient have difficulty walking or climbing stairs?: No Weakness of Legs: None Weakness of Arms/Hands: None  Permission Sought/Granted                  Emotional Assessment              Admission diagnosis:  Thrombocytopenia ([place]) [D69.6] TTP (thrombotic thrombocytopenic purpura) ([place]) [M31.19] Acute cystitis with hematuria [N30.01] Hypertension, unspecified type [I10] Patient Active Problem List   Diagnosis Date Noted   Urinary tract infection 11/22/2021   Acute  cystitis with hematuria 11/22/2021   Cervical disc disorder with radiculopathy of cervical region 06/20/2020   Seasonal allergic rhinitis 03/05/2020   Chronic left shoulder pain 11/27/2019   Insomnia 06/09/2019   PICC (peripherally inserted central catheter) in place 09/05/2018   Chest wall pain 08/14/2018   At risk for dysfunction of heart 07/11/2018   Mixed hyperlipidemia 02/28/2018   Type 2 diabetes mellitus without complications ([place]) 27/25/3664   Phlebitis 05/12/2017   Leg swelling 05/12/2017   Tobacco use disorder 04/13/2017   GERD (gastroesophageal reflux disease)    Hypertension    TTP (thrombotic thrombocytopenic purpura) ([place]) 02/06/2017   Alcohol use 02/03/2017   Renal insufficiency 02/03/2017   Thrombocytopenia ([place]) 02/01/2017   Hypokalemia 02/01/2017   Macrocytic anemia 02/01/2017   Microscopic hematuria 02/01/2017   Diverticulosis 02/01/2017   PCP:  Rise Patience, DO Pharmacy:   [id number] [id number]: [phone]: [phone] [facility] [place] [facility] [address] [id number]: [phone]: [phone]    Social Determinants of  Health (SDOH) Interventions    Readmission Risk Interventions     No data to display

## 2021-11-25 NOTE — Progress Notes (Addendum)
     Daily Progress Note Intern Pager: [phone]  Patient name: Eric Hooper Medical record number: [id number] Date of birth: 1975/07/08 Age: 46 y.o. Gender: male  Primary Care Provider: Rise Patience, DO Consultants: Nephrology, IR, heme-onc Code Status: Full  Pt Overview and Major Events to Date:  7/29-admitted 7/30-IR placed catheter, first plasmapheresis sections  Assessment and Plan: Eric Hooper is a 46 year old male with past medical history significant for TTP, T2DM presented with hematuria and found to be in TTP and s/p first plasmapheresis.   * TTP (thrombotic thrombocytopenic purpura) (HCC)  Platelet count improved to 16.  LDL elevated but downtrending at 605. Mildly jaundiced on exam. Patient to get daily plasmapheresis managed by nephro for 7 days. Started on '80mg'$  prednisone per Heme, will start patient on protonix for GI protection. - Heme-onc following, appreciate recommendations - Daily plasmapheresis w/ FFP replacement (day 3 of 7) - Started on '80mg'$  prednisone - Started protonix '20mg'$  daily   - Monitor LDH levels daily  - Avoid NSAID use - SCDs  Acute cystitis with hematuria Asymptomatic.  s/p Rocephin x1 - F/U urine cultures - Hold antibiotics given Rocephin effect on platelets, asymptomatic other than hematuria   Type 2 diabetes mellitus without complications (HCC) CBG this morning was 137.  The patient required about 6 SAI in 24 hours.  Home regimen: Metformin - Sensitive sliding scale insulin - Monitor CBGs  Hypertension Stable. home medication include amlodipine '5mg'$ , Losartan '50mg'$  - continue home meds   Hypokalemia K 3.3 this AM - 80 mg K repletion  - BMP daily    FEN/GI: Regular PPx: SCDs Dispo:Home pending clinical improvement .   Subjective:  Eric Hooper said he is doing well has no complaint.  Endorses mild jaundice on the palms and hematuria clearing up.  Objective: Temp:  [97.8 F (36.6 C)-98.5 F (36.9 C)] 98.4 F (36.9 C)  (08/01 0729) Pulse Rate:  [45-89] 65 (08/01 0729) Resp:  [16-19] 17 (08/01 0729) BP: (122-141)/(73-99) 129/99 (08/01 0729) SpO2:  [97 %-99 %] 97 % (08/01 0729) General: Alert, well appearing, NAD HEENT:  MMM, + sclera icterus CV: RRR, no murmurs, normal S1/S2 Pulm: CTAB, good WOB on RA, no crackles or wheezing Abd: Soft, no distension, no tenderness Skin: dry, warm, jaundiced Ext: No BLE edema   Laboratory: Most recent CBC Lab Results  Component Value Date   WBC 12.3 (H) 11/25/2021   HGB 8.2 (L) 11/25/2021   HCT 23.8 (L) 11/25/2021   MCV 94.8 11/25/2021   PLT 16 (LL) 11/25/2021   Most recent BMP    Latest Ref Rng & Units 11/25/2021    3:25 AM  BMP  Glucose 70 - 99 mg/dL 137   BUN 6 - 20 mg/dL 19   Creatinine 0.61 - 1.24 mg/dL 1.00   Sodium 135 - 145 mmol/L 142   Potassium 3.5 - 5.1 mmol/L 3.3   Chloride 98 - 111 mmol/L 107   CO2 22 - 32 mmol/L 30   Calcium 8.9 - 10.3 mg/dL 8.6       Imaging/Diagnostic Tests: No new images  Alen Bleacher, MD 11/25/2021, 10:32 AM  PGY-2, [place] Intern pager: [phone], text pages welcome Secure chat group [facility] Teaching Service

## 2021-11-25 NOTE — Progress Notes (Addendum)
HEMATOLOGY-ONCOLOGY PROGRESS NOTE  ASSESSMENT AND PLAN: 1.  TTP initially diagnosed in October 2018 with recurrence in April 2020 and July 2023. Initiation of daily plasma exchange and Solu-Medrol 02/02/2017 [name] Antibody- 72, activity less than 2% on initial presentation; [name] from 08/15/2018 <2.0. Last plasma exchange 02/06/2017 Prednisone 60 mg daily beginning 02/08/2017 Prednisone 40 mg daily beginning 02/17/2017 Prednisone 30 mg daily beginning 03/09/2017 Prednisone 20 mg daily beginning 03/23/2017 Prednisone 10 mg daily beginning 04/01/2017 Prednisone 5 mg daily for 5 days then 5 mg every other day for 5 doses then stop beginning 05/11/2017 Recurrent TTP 08/14/2018 Plasma exchange/steroids started on 08/15/2018.  Plasma exchange discontinued after treatment on 08/19/2019, discharged on prednisone '60mg'$  daily Prednisone taper to 40 mg daily 08/22/2018 Weekly rituximab for 4 doses beginning 08/25/2018 Plasma exchange resumed 08/26/2018, 08/27/2018, 08/28/2018, 08/29/2018, 08/31/2018, 09/02/2018 Prednisone taper to 30 mg daily 09/01/2018 Cycle 3 weekly Rituxan 09/08/2018 Prednisone taper to 20 mg daily 09/08/2018 Cycle 4 weekly Rituxan 09/15/2018 Prednisone taper to 10 mg daily 09/20/2018 Prednisone taper to 5 mg daily 09/27/2018 Prednisone discontinued 10/07/2018 Admission with relapse of TTP 10/23/2021 Plasma exchange initiated 11/23/2021 2. Xiphoid pain-etiology unclear 3. Hypertension 4. Diabetes 5. Alcohol/Tobacco use 6. History of renal insufficiency 7. Right IJ dialysis catheter placed 02/02/2017, removed New right IJ dialysis catheter 08/15/2018 Catheter removed 09/12/2018 New right IJ dialysis catheter placed 11/23/2021  [name] appears improved.  He continues on plasma exchange.  Today is day 3.  Labs from today have been reviewed.  Platelet count is slowly improving and is up to 16,000 today.  He has no active bleeding.  Hemoglobin down slightly to 8.2.  LDH and T. bili continue to trend  downward.  Plan is to continue daily plasma exchange.  Will add prednisone 80 mg p.o. daily.  We will consider transitioning him to outpatient plasma exchange if counts continue to improve.  We will continue to check a daily CBC, CMET, and LDH.  We will also consider rituximab as an outpatient.  Recommendations: 1.  Continue daily plasma exchange. 2.  Monitor CBC, CMP, LDH daily. 3.  Add prednisone 80 mg p.o. daily. 4.  We will consider him for rituximab as an outpatient.  [name]  [name] was interviewed and examined.  I reviewed the peripheral blood smear.  He is known to me with a history of TTP.  He is now admitted with a third relapse of TTP.  There is no apparent inciting event to account for the TTP relapse.  He appears to be responding to plasmapheresis.  I recommend continuing daily plasmapheresis and beginning prednisone.  We will taper the prednisone slowly.  We will plan for a course of rituximab as an outpatient.  We will [name] activity and antibody levels, though this will be confounded by the plasma replacement.  I will continue following [name] in the hospital.  Plasmapheresis can be transition to outpatient once the platelet count is further improved.  I was present for greater than 50% of today's visit.  I performed medical decision making.   SUBJECTIVE: The patient was seen during plasma exchange today.  He reported some mild dizziness which he attributes to starting back on prednisone today.  He currently denies headaches.  Denies bleeding.  Has been tolerating plasma exchange well.  PHYSICAL EXAMINATION:  Vitals:   11/25/21 0559 11/25/21 0729  BP: (!) 131/94 (!) 129/99  Pulse: 73 65  Resp:  17  Temp: 98.1 F (36.7 C) 98.4 F (36.9 C)  SpO2: 99% 97%   Filed Weights   11/22/21 2102 11/23/21 1401 11/24/21 0500  Weight: 81.6 kg 81 kg 89.3 kg    Intake/Output from previous day: 07/31 0701 - 08/01 0700 In: 1194 [P.O.:600; I.V.:10; IV  Piggyback:1105] Out: -   Physical Exam Vitals reviewed.  Constitutional:      General: He is not in acute distress. HENT:     Head: Normocephalic.  Eyes:     General: Scleral icterus present.  Pulmonary:     Effort: Pulmonary effort is normal. No respiratory distress.  Skin:    General: Skin is warm and dry.  Neurological:     Mental Status: He is alert and oriented to person, place, and time.     LABORATORY DATA:  I have reviewed the data as listed    Latest Ref Rng & Units 11/25/2021    3:25 AM 11/24/2021    1:03 PM 11/24/2021    3:41 AM  CMP  Glucose 70 - 99 mg/dL 137  194  160   BUN 6 - 20 mg/dL 19  29  36   Creatinine 0.61 - 1.24 mg/dL 1.00  1.13  1.28   Sodium 135 - 145 mmol/L 142  141  141   Potassium 3.5 - 5.1 mmol/L 3.3  3.4  3.3   Chloride 98 - 111 mmol/L 107  104  104   CO2 22 - 32 mmol/L '30  29  30   '$ Calcium 8.9 - 10.3 mg/dL 8.6  8.7  8.8   Total Protein 6.5 - 8.1 g/dL 5.3   5.3   Total Bilirubin 0.3 - 1.2 mg/dL 4.4   8.5   Alkaline Phos 38 - 126 U/L 45   44   AST 15 - 41 U/L 36   44   ALT 0 - 44 U/L 30   27     Lab Results  Component Value Date   WBC 12.3 (H) 11/25/2021   HGB 8.2 (L) 11/25/2021   HCT 23.8 (L) 11/25/2021   MCV 94.8 11/25/2021   PLT 16 (LL) 11/25/2021   NEUTROABS 6.1 11/24/2021   Peripheral blood smear 10/23/2021: The platelets are markedly decreased, rare large platelet.  No platelet clumps.  The white cell morphology is unremarkable.  The polychromasia is increased.  Numerous schistocytes.  IR Fluoro Guide CV Line Right  Result Date: 11/23/2021 INDICATION: TTP requiring plasmapheresis EXAM: Temporary dialysis catheter placement MEDICATIONS: None ANESTHESIA/SEDATION: None FLUOROSCOPY: Radiation Exposure Index (as provided by the fluoroscopic device): 1 mGy Kerma COMPLICATIONS: None immediate. PROCEDURE: Informed written consent was obtained from the patient after a thorough discussion of the procedural risks, benefits and alternatives.  All questions were addressed. Maximal Sterile Barrier Technique was utilized including caps, mask, sterile gowns, sterile gloves, sterile drape, hand hygiene and skin antiseptic. A timeout was performed prior to the initiation of the procedure. Right neck prepped and draped in the usual sterile fashion. All elements of maximal sterile barrier were utilized including, cap, mask, sterile gown, sterile gloves, large sterile drape, hand scrubbing and 2% Chlorhexidine for skin cleaning. The right internal jugular vein was evaluated with ultrasound and shown to be patent. A permanent ultrasound image was obtained and placed in the patient's medical record. Using sterile gel and a sterile probe cover, the right internal jugular vein was entered with a 21 ga needle during real time ultrasound guidance. 0.018 inch guidewire placed and 21 ga needle exchanged for transitional dilator set. Utilizing fluoroscopy, 0.035 inch guidewire advanced through  the transitional dilator the level of the IVC without difficulty. Serial dilation performed, and catheter inserted over the guidewire. The tip was positioned in the right atrium. All lumens of the catheter aspirated and flushed well. The dialysis lumens were locked with Heparin. The catheter was secured to the skin with suture. The insertion site was covered with sterile dressing. IMPRESSION: 15 cm temporary right IJ Trialysis catheter is ready for use. Electronically Signed   By: Miachel Roux M.D.   On: 11/23/2021 09:51   IR US Guide Vasc Access Right  Result Date: 11/23/2021 INDICATION: TTP requiring plasmapheresis EXAM: Temporary dialysis catheter placement MEDICATIONS: None ANESTHESIA/SEDATION: None FLUOROSCOPY: Radiation Exposure Index (as provided by the fluoroscopic device): 1 mGy Kerma COMPLICATIONS: None immediate. PROCEDURE: Informed written consent was obtained from the patient after a thorough discussion of the procedural risks, benefits and alternatives. All  questions were addressed. Maximal Sterile Barrier Technique was utilized including caps, mask, sterile gowns, sterile gloves, sterile drape, hand hygiene and skin antiseptic. A timeout was performed prior to the initiation of the procedure. Right neck prepped and draped in the usual sterile fashion. All elements of maximal sterile barrier were utilized including, cap, mask, sterile gown, sterile gloves, large sterile drape, hand scrubbing and 2% Chlorhexidine for skin cleaning. The right internal jugular vein was evaluated with ultrasound and shown to be patent. A permanent ultrasound image was obtained and placed in the patient's medical record. Using sterile gel and a sterile probe cover, the right internal jugular vein was entered with a 21 ga needle during real time ultrasound guidance. 0.018 inch guidewire placed and 21 ga needle exchanged for transitional dilator set. Utilizing fluoroscopy, 0.035 inch guidewire advanced through the transitional dilator the level of the IVC without difficulty. Serial dilation performed, and catheter inserted over the guidewire. The tip was positioned in the right atrium. All lumens of the catheter aspirated and flushed well. The dialysis lumens were locked with Heparin. The catheter was secured to the skin with suture. The insertion site was covered with sterile dressing. IMPRESSION: 15 cm temporary right IJ Trialysis catheter is ready for use. Electronically Signed   By: Miachel Roux M.D.   On: 11/23/2021 09:51   CT Abdomen Pelvis W Contrast  Result Date: 11/22/2021 CLINICAL DATA:  Acute abdominal pain.  Hematuria. EXAM: CT ABDOMEN AND PELVIS WITH CONTRAST TECHNIQUE: Multidetector CT imaging of the abdomen and pelvis was performed using the standard protocol following bolus administration of intravenous contrast. RADIATION DOSE REDUCTION: This exam was performed according to the departmental dose-optimization program which includes automated exposure control, adjustment of  the mA and/or kV according to patient size and/or use of iterative reconstruction technique. CONTRAST:  116m OMNIPAQUE IOHEXOL 300 MG/ML  SOLN COMPARISON:  Noncontrast CT on 12/21/2017 FINDINGS: Lower Chest: No acute findings. Hepatobiliary: No hepatic masses identified. Gallbladder is unremarkable. No evidence of biliary ductal dilatation. Pancreas:  No mass or inflammatory changes. Spleen: Within normal limits in size and appearance. Adrenals/Urinary Tract: No evidence of ureteral calculi or hydronephrosis. Symmetric perinephric soft tissue stranding is seen bilaterally with extension inferiorly into pelvis. No renal masses or other parenchymal lesions are identified. Urinary bladder is unremarkable. Stomach/Bowel: No evidence of obstruction, inflammatory process or abnormal fluid collections. Vascular/Lymphatic: No pathologically enlarged lymph nodes. No acute vascular findings. Reproductive:  No mass or other significant abnormality. Other:  None. Musculoskeletal:  No suspicious bone lesions identified. IMPRESSION: Bilateral perinephric soft tissue stranding, with extension into pelvis. No evidence of hydronephrosis. This is nonspecific,  and differential diagnosis includes bilateral pyelonephritis, vesicoureteral reflux, and medical renal disease. Recommend correlation with urinalysis. Electronically Signed   By: Marlaine Hind M.D.   On: 11/22/2021 18:56     No future appointments.    LOS: 2 days

## 2021-11-26 ENCOUNTER — Encounter (HOSPITAL_COMMUNITY): Payer: Self-pay | Admitting: Family Medicine

## 2021-11-26 DIAGNOSIS — E119 Type 2 diabetes mellitus without complications: Secondary | ICD-10-CM | POA: Diagnosis not present

## 2021-11-26 DIAGNOSIS — D696 Thrombocytopenia, unspecified: Secondary | ICD-10-CM | POA: Diagnosis not present

## 2021-11-26 DIAGNOSIS — M3119 Other thrombotic microangiopathy: Secondary | ICD-10-CM | POA: Diagnosis not present

## 2021-11-26 LAB — THERAPEUTIC PLASMA EXCHANGE (BLOOD BANK)
Plasma Exchange: 3600
Plasma volume needed: 3600
Unit division: 0
Unit division: 0
Unit division: 0
Unit division: 0
Unit division: 0
Unit division: 0
Unit division: 0
Unit division: 0
Unit division: 0
Unit division: 0

## 2021-11-26 LAB — CBC WITH DIFFERENTIAL/PLATELET
Abs Immature Granulocytes: 0.29 10*3/uL — ABNORMAL HIGH (ref 0.00–0.07)
Basophils Absolute: 0 10*3/uL (ref 0.0–0.1)
Basophils Relative: 0 %
Eosinophils Absolute: 0.2 10*3/uL (ref 0.0–0.5)
Eosinophils Relative: 1 %
HCT: 22.6 % — ABNORMAL LOW (ref 39.0–52.0)
Hemoglobin: 7.7 g/dL — ABNORMAL LOW (ref 13.0–17.0)
Immature Granulocytes: 2 %
Lymphocytes Relative: 34 %
Lymphs Abs: 5.6 10*3/uL — ABNORMAL HIGH (ref 0.7–4.0)
MCH: 32.8 pg (ref 26.0–34.0)
MCHC: 34.1 g/dL (ref 30.0–36.0)
MCV: 96.2 fL (ref 80.0–100.0)
Monocytes Absolute: 1.3 10*3/uL — ABNORMAL HIGH (ref 0.1–1.0)
Monocytes Relative: 8 %
Neutro Abs: 9.4 10*3/uL — ABNORMAL HIGH (ref 1.7–7.7)
Neutrophils Relative %: 55 %
Platelets: 36 10*3/uL — ABNORMAL LOW (ref 150–400)
RBC: 2.35 MIL/uL — ABNORMAL LOW (ref 4.22–5.81)
RDW: 18.6 % — ABNORMAL HIGH (ref 11.5–15.5)
WBC: 16.8 10*3/uL — ABNORMAL HIGH (ref 4.0–10.5)
nRBC: 3 % — ABNORMAL HIGH (ref 0.0–0.2)

## 2021-11-26 LAB — GLUCOSE, CAPILLARY
Glucose-Capillary: 165 mg/dL — ABNORMAL HIGH (ref 70–99)
Glucose-Capillary: 248 mg/dL — ABNORMAL HIGH (ref 70–99)
Glucose-Capillary: 382 mg/dL — ABNORMAL HIGH (ref 70–99)
Glucose-Capillary: 247 mg/dL — ABNORMAL HIGH (ref 70–99)

## 2021-11-26 LAB — LACTATE DEHYDROGENASE: LDH: 411 U/L — ABNORMAL HIGH (ref 98–192)

## 2021-11-26 LAB — COMPREHENSIVE METABOLIC PANEL
ALT: 35 U/L (ref 0–44)
AST: 26 U/L (ref 15–41)
Albumin: 3.2 g/dL — ABNORMAL LOW (ref 3.5–5.0)
Alkaline Phosphatase: 49 U/L (ref 38–126)
Anion gap: 4 — ABNORMAL LOW (ref 5–15)
BUN: 16 mg/dL (ref 6–20)
CO2: 27 mmol/L (ref 22–32)
Calcium: 9.1 mg/dL (ref 8.9–10.3)
Chloride: 108 mmol/L (ref 98–111)
Creatinine, Ser: 1 mg/dL (ref 0.61–1.24)
GFR, Estimated: >60 mL/min (ref >60)
Glucose, Bld: 189 mg/dL — ABNORMAL HIGH (ref 70–99)
Potassium: 3.3 mmol/L — ABNORMAL LOW (ref 3.5–5.1)
Sodium: 139 mmol/L (ref 135–145)
Total Bilirubin: 2.2 mg/dL — ABNORMAL HIGH (ref 0.3–1.2)
Total Protein: 5.6 g/dL — ABNORMAL LOW (ref 6.5–8.1)

## 2021-11-26 LAB — RETICULOCYTES
Immature Retic Fract: 47.3 % — ABNORMAL HIGH (ref 2.3–15.9)
RBC.: 2.35 MIL/uL — ABNORMAL LOW (ref 4.22–5.81)
Retic Count, Absolute: 170.6 10*3/uL (ref 19.0–186.0)
Retic Ct Pct: 7.3 % — ABNORMAL HIGH (ref 0.4–3.1)

## 2021-11-26 MED ORDER — ESOMEPRAZOLE MAGNESIUM 20 MG PO CPDR
20.0000 mg | DELAYED_RELEASE_CAPSULE | Freq: Every day | ORAL | Status: DC
  Administered 2021-11-27 – 2021-11-29 (×3): 20 mg via ORAL
  Filled 2021-11-26 (×3): qty 1

## 2021-11-26 MED ORDER — ORAL CARE MOUTH RINSE
15.0000 mL | OROMUCOSAL | Status: DC | PRN
Start: 2021-11-26 — End: 2021-11-29

## 2021-11-26 MED ORDER — POTASSIUM CHLORIDE CRYS ER 20 MEQ PO TBCR
40.0000 meq | EXTENDED_RELEASE_TABLET | Freq: Two times a day (BID) | ORAL | Status: AC
  Administered 2021-11-26: 40 meq via ORAL
  Filled 2021-11-26: qty 2

## 2021-11-26 MED ORDER — INSULIN ASPART 100 UNIT/ML IJ SOLN
0.0000 [IU] | Freq: Three times a day (TID) | INTRAMUSCULAR | Status: DC
  Administered 2021-11-27: 8 [IU] via SUBCUTANEOUS
  Administered 2021-11-27: 5 [IU] via SUBCUTANEOUS

## 2021-11-26 MED ORDER — METFORMIN HCL 500 MG PO TABS
1000.0000 mg | ORAL_TABLET | Freq: Two times a day (BID) | ORAL | Status: DC
  Administered 2021-11-26 – 2021-11-29 (×7): 1000 mg via ORAL
  Filled 2021-11-26 (×7): qty 2

## 2021-11-26 MED ORDER — FOLIC ACID 1 MG PO TABS
1.0000 mg | ORAL_TABLET | Freq: Every day | ORAL | Status: DC
  Administered 2021-11-26 – 2021-11-29 (×4): 1 mg via ORAL
  Filled 2021-11-26 (×4): qty 1

## 2021-11-26 MED ORDER — NON FORMULARY: 20.0000 mg | Freq: Every day | Status: DC

## 2021-11-26 MED ORDER — INSULIN ASPART 100 UNIT/ML IJ SOLN
0.0000 [IU] | Freq: Three times a day (TID) | INTRAMUSCULAR | Status: DC
  Administered 2021-11-26: 15 [IU] via SUBCUTANEOUS
  Administered 2021-11-26: 5 [IU] via SUBCUTANEOUS

## 2021-11-26 MED ORDER — POTASSIUM CHLORIDE CRYS ER 20 MEQ PO TBCR
40.0000 meq | EXTENDED_RELEASE_TABLET | Freq: Two times a day (BID) | ORAL | Status: DC
  Administered 2021-11-26: 40 meq via ORAL
  Filled 2021-11-26: qty 2

## 2021-11-26 MED ORDER — PNEUMOCOCCAL 20-VAL CONJ VACC 0.5 ML IM SUSY
0.5000 mL | PREFILLED_SYRINGE | INTRAMUSCULAR | Status: DC
  Filled 2021-11-26: qty 0.5

## 2021-11-26 MED ORDER — ANTICOAGULANT SODIUM CITRATE 4% (200MG/5ML) IV SOLN
5.0000 mL | Freq: Once | Status: AC
  Administered 2021-11-26: 5 mL
  Filled 2021-11-26: qty 5

## 2021-11-26 MED ORDER — INSULIN ASPART 100 UNIT/ML IJ SOLN
3.0000 [IU] | Freq: Three times a day (TID) | INTRAMUSCULAR | Status: DC
  Administered 2021-11-26 – 2021-11-27 (×3): 3 [IU] via SUBCUTANEOUS

## 2021-11-26 NOTE — Inpatient Diabetes Management (Signed)
Inpatient Diabetes Program Recommendations  AACE/ADA: New Consensus Statement on Inpatient Glycemic Control (2015)  Target Ranges:  Prepandial:   less than 140 mg/dL      Peak postprandial:   less than 180 mg/dL (1-2 hours)      Critically ill patients:  140 - 180 mg/dL    Latest Reference Range & Units 11/25/21 08:11 11/25/21 16:27 11/25/21 21:04  Glucose-Capillary 70 - 99 mg/dL 155 (H)  2 units Novolog  351 (H)  9 units Novolog  285 (H)  (H): Data is abnormally high  Latest Reference Range & Units 11/26/21 07:13 11/26/21 11:29  Glucose-Capillary 70 - 99 mg/dL 165 (H)  2 units Novolog  248 (H)  (H): Data is abnormally high    Home DM Meds: Metformin 1000 mg BID  Current Orders: Novolog Moderate Correction Scale/ SSI (0-15 units) TID AC       Metformin 1000 mg BID     MD- Note patient started on Prednisone 80 mg daily yesterday.  Afternoon CBGs are now elevated--Note Novolog SSi increased to the 0-15 unit Moderate scale today  If pt continues to have afternoon elevations, may consider adding Novolog Meal Coverage:  Novolog 3 units TID with meals HOLD if pt eats <50% meals    --Will follow patient during hospitalization--  Wyn Quaker RN, MSN, [facility] Diabetes Coordinator Inpatient Glycemic Control Team Team Pager: [phone] (8a-5p)

## 2021-11-26 NOTE — Progress Notes (Signed)
HEMATOLOGY-ONCOLOGY PROGRESS NOTE  ASSESSMENT AND PLAN: 1.  TTP initially diagnosed in October 2018 with recurrence in April 2020 and July 2023. Initiation of daily plasma exchange and Solu-Medrol 02/02/2017 [name] Antibody- 72, activity less than 2% on initial presentation; [name] from 08/15/2018 <2.0. Last plasma exchange 02/06/2017 Prednisone 60 mg daily beginning 02/08/2017 Prednisone 40 mg daily beginning 02/17/2017 Prednisone 30 mg daily beginning 03/09/2017 Prednisone 20 mg daily beginning 03/23/2017 Prednisone 10 mg daily beginning 04/01/2017 Prednisone 5 mg daily for 5 days then 5 mg every other day for 5 doses then stop beginning 05/11/2017 Recurrent TTP 08/14/2018 Plasma exchange/steroids started on 08/15/2018.  Plasma exchange discontinued after treatment on 08/19/2019, discharged on prednisone '60mg'$  daily Prednisone taper to 40 mg daily 08/22/2018 Weekly rituximab for 4 doses beginning 08/25/2018 Plasma exchange resumed 08/26/2018, 08/27/2018, 08/28/2018, 08/29/2018, 08/31/2018, 09/02/2018 Prednisone taper to 30 mg daily 09/01/2018 Cycle 3 weekly Rituxan 09/08/2018 Prednisone taper to 20 mg daily 09/08/2018 Cycle 4 weekly Rituxan 09/15/2018 Prednisone taper to 10 mg daily 09/20/2018 Prednisone taper to 5 mg daily 09/27/2018 Prednisone discontinued 10/07/2018 Admission with relapse of TTP 10/23/2021 Daily Plasma exchange initiated 11/23/2021 - prednisone 8/1 2. Xiphoid pain-etiology unclear 3. Hypertension 4. Diabetes 5. Alcohol/Tobacco use 6. History of renal insufficiency 7. Right IJ dialysis catheter placed 02/02/2017, removed New right IJ dialysis catheter 08/15/2018 Catheter removed 09/12/2018 New right IJ dialysis catheter placed 11/23/2021  Eric Hooper has completed 3 daily plasma exchange procedures.  The platelet count and markers of hemolysis have improved. He has severe anemia due to hemolysis and phlebotomy/blood loss with apheresis.  He may require short term transfusion  support.  Recommendations: 1.  Continue daily plasma exchange. 2.  Monitor CBC, CMP, LDH daily. 3.  Continue prednisone 4.  Monitor CBG while on prednisone 5.  Start Folic Acid 6.  Transfuse red blood cells if hemoglobin is lower tomorrow 7.  Transition to outpatient plasma exchange when platelet count normalizes  [name]    SUBJECTIVE:  He reports tolerating the plasmapheresis well.  Noticed feeling mild dizziness and anxiety when started on prednisone. No bleeding. Ambulating.  PHYSICAL EXAMINATION:  Vitals:   11/25/21 1916 11/26/21 0713  BP: (!) 147/95 (!) 139/94  Pulse: 92 83  Resp:    Temp: 98.1 F (36.7 C) 98.5 F (36.9 C)  SpO2: 100% 100%   Filed Weights   11/24/21 0500 11/25/21 1207 11/26/21 0500  Weight: 196 lb 13.9 oz (89.3 kg) 196 lb 3.4 oz (89 kg) 195 lb 5.2 oz (88.6 kg)    Intake/Output from previous day: No intake/output data recorded.  Physical Exam: HEENT: No thrush Lungs: clear bilaterally CV: RRR ABD: No HSM Vascular: No leg edema  LABORATORY DATA:  I have reviewed the data as listed    Latest Ref Rng & Units 11/26/2021    4:00 AM 11/25/2021   12:04 PM 11/25/2021    3:25 AM  CMP  Glucose 70 - 99 mg/dL 189  269  137   BUN 6 - 20 mg/dL '16  19  19   '$ Creatinine 0.61 - 1.24 mg/dL 1.00  1.00  1.00   Sodium 135 - 145 mmol/L 139  141  142   Potassium 3.5 - 5.1 mmol/L 3.3  4.0  3.3   Chloride 98 - 111 mmol/L 108  100  107   CO2 22 - 32 mmol/L 27   30   Calcium 8.9 - 10.3 mg/dL 9.1   8.6   Total Protein 6.5 - 8.1 g/dL 5.6  5.3   Total Bilirubin 0.3 - 1.2 mg/dL 2.2   4.4   Alkaline Phos 38 - 126 U/L 49   45   AST 15 - 41 U/L 26   36   ALT 0 - 44 U/L 35   30     Lab Results  Component Value Date   WBC 16.8 (H) 11/26/2021   HGB 7.7 (L) 11/26/2021   HCT 22.6 (L) 11/26/2021   MCV 96.2 11/26/2021   PLT 36 (L) 11/26/2021   NEUTROABS 9.4 (H) 11/26/2021   Peripheral blood smear 10/23/2021: The platelets are markedly decreased, rare large  platelet.  No platelet clumps.  The white cell morphology is unremarkable.  The polychromasia is increased.  Numerous schistocytes.  IR Fluoro Guide CV Line Right  Result Date: 11/23/2021 INDICATION: TTP requiring plasmapheresis EXAM: Temporary dialysis catheter placement MEDICATIONS: None ANESTHESIA/SEDATION: None FLUOROSCOPY: Radiation Exposure Index (as provided by the fluoroscopic device): 1 mGy Kerma COMPLICATIONS: None immediate. PROCEDURE: Informed written consent was obtained from the patient after a thorough discussion of the procedural risks, benefits and alternatives. All questions were addressed. Maximal Sterile Barrier Technique was utilized including caps, mask, sterile gowns, sterile gloves, sterile drape, hand hygiene and skin antiseptic. A timeout was performed prior to the initiation of the procedure. Right neck prepped and draped in the usual sterile fashion. All elements of maximal sterile barrier were utilized including, cap, mask, sterile gown, sterile gloves, large sterile drape, hand scrubbing and 2% Chlorhexidine for skin cleaning. The right internal jugular vein was evaluated with ultrasound and shown to be patent. A permanent ultrasound image was obtained and placed in the patient's medical record. Using sterile gel and a sterile probe cover, the right internal jugular vein was entered with a 21 ga needle during real time ultrasound guidance. 0.018 inch guidewire placed and 21 ga needle exchanged for transitional dilator set. Utilizing fluoroscopy, 0.035 inch guidewire advanced through the transitional dilator the level of the IVC without difficulty. Serial dilation performed, and catheter inserted over the guidewire. The tip was positioned in the right atrium. All lumens of the catheter aspirated and flushed well. The dialysis lumens were locked with Heparin. The catheter was secured to the skin with suture. The insertion site was covered with sterile dressing. IMPRESSION: 15 cm  temporary right IJ Trialysis catheter is ready for use. Electronically Signed   By: Miachel Roux M.D.   On: 11/23/2021 09:51   IR US Guide Vasc Access Right  Result Date: 11/23/2021 INDICATION: TTP requiring plasmapheresis EXAM: Temporary dialysis catheter placement MEDICATIONS: None ANESTHESIA/SEDATION: None FLUOROSCOPY: Radiation Exposure Index (as provided by the fluoroscopic device): 1 mGy Kerma COMPLICATIONS: None immediate. PROCEDURE: Informed written consent was obtained from the patient after a thorough discussion of the procedural risks, benefits and alternatives. All questions were addressed. Maximal Sterile Barrier Technique was utilized including caps, mask, sterile gowns, sterile gloves, sterile drape, hand hygiene and skin antiseptic. A timeout was performed prior to the initiation of the procedure. Right neck prepped and draped in the usual sterile fashion. All elements of maximal sterile barrier were utilized including, cap, mask, sterile gown, sterile gloves, large sterile drape, hand scrubbing and 2% Chlorhexidine for skin cleaning. The right internal jugular vein was evaluated with ultrasound and shown to be patent. A permanent ultrasound image was obtained and placed in the patient's medical record. Using sterile gel and a sterile probe cover, the right internal jugular vein was entered with a 21 ga needle during real time ultrasound guidance.  0.018 inch guidewire placed and 21 ga needle exchanged for transitional dilator set. Utilizing fluoroscopy, 0.035 inch guidewire advanced through the transitional dilator the level of the IVC without difficulty. Serial dilation performed, and catheter inserted over the guidewire. The tip was positioned in the right atrium. All lumens of the catheter aspirated and flushed well. The dialysis lumens were locked with Heparin. The catheter was secured to the skin with suture. The insertion site was covered with sterile dressing. IMPRESSION: 15 cm temporary  right IJ Trialysis catheter is ready for use. Electronically Signed   By: Miachel Roux M.D.   On: 11/23/2021 09:51   CT Abdomen Pelvis W Contrast  Result Date: 11/22/2021 CLINICAL DATA:  Acute abdominal pain.  Hematuria. EXAM: CT ABDOMEN AND PELVIS WITH CONTRAST TECHNIQUE: Multidetector CT imaging of the abdomen and pelvis was performed using the standard protocol following bolus administration of intravenous contrast. RADIATION DOSE REDUCTION: This exam was performed according to the departmental dose-optimization program which includes automated exposure control, adjustment of the mA and/or kV according to patient size and/or use of iterative reconstruction technique. CONTRAST:  148m OMNIPAQUE IOHEXOL 300 MG/ML  SOLN COMPARISON:  Noncontrast CT on 12/21/2017 FINDINGS: Lower Chest: No acute findings. Hepatobiliary: No hepatic masses identified. Gallbladder is unremarkable. No evidence of biliary ductal dilatation. Pancreas:  No mass or inflammatory changes. Spleen: Within normal limits in size and appearance. Adrenals/Urinary Tract: No evidence of ureteral calculi or hydronephrosis. Symmetric perinephric soft tissue stranding is seen bilaterally with extension inferiorly into pelvis. No renal masses or other parenchymal lesions are identified. Urinary bladder is unremarkable. Stomach/Bowel: No evidence of obstruction, inflammatory process or abnormal fluid collections. Vascular/Lymphatic: No pathologically enlarged lymph nodes. No acute vascular findings. Reproductive:  No mass or other significant abnormality. Other:  None. Musculoskeletal:  No suspicious bone lesions identified. IMPRESSION: Bilateral perinephric soft tissue stranding, with extension into pelvis. No evidence of hydronephrosis. This is nonspecific, and differential diagnosis includes bilateral pyelonephritis, vesicoureteral reflux, and medical renal disease. Recommend correlation with urinalysis. Electronically Signed   By: JMarlaine HindM.D.    On: 11/22/2021 18:56     No future appointments.    LOS: 3 days

## 2021-11-26 NOTE — Progress Notes (Addendum)
     Daily Progress Note Intern Pager: [phone]  Patient name: Eric Hooper Medical record number: [id number] Date of birth: 1976-04-17 Age: 46 y.o. Gender: male  Primary Care Provider: Rise Patience, DO Consultants: Nephrology, IR, heme-onc Code Status: Full  Pt Overview and Major Events to Date:  7/29-admitted 7/30- IR placed Catheter , first plasmapheresis for section  Assessment and Plan: Eric Hooper is a 46 year old male with past medical history of TTP and T2DM presenting with hematuria and found to be in TTP.   * TTP (thrombotic thrombocytopenic purpura) (HCC) Platelet is uptrending and improved to 36. LDL elevated but downtrending at 411. Mildly jaundiced on exam.  Currently on daily plasmapheresis and prednisone. - Heme-onc following, appreciate recommendations - Daily plasmapheresis w/ FFP replacement (day 4 of 7) -Continue '80mg'$  prednisone daily, will taper when appriopriate - Continue protonix '20mg'$  daily  -Started on daily folic acid, per Heme  - Monitor LDH levels daily  - Avoid NSAID use - SCDs -Consider starting rituximab outpatient  Acute cystitis with hematuria Asymptomatic. Urine culture was with no growth - s/p Rocephin x1    Type 2 diabetes mellitus without complications (HCC) CBG range in 24 hours 155-391.  Recently started on high-dose prednisone required about 11u SAI in 24H.  Increase to moderate sliding scale insulin.  Home regimen: Metformin - increased to moderate sliding scale. - Monitor CBGs  Hypertension Stable. home medication include amlodipine '5mg'$ , Losartan '50mg'$  - continue home meds   Hypokalemia K 3.3 this AM - 80 mg K repletion  - BMP daily     FEN/GI: Regular PPx: SCDs Dispo:Home pending clinical improvement . Barriers include plasmapheresis treatment.   Subjective:  Patient reports he is doing well and has no complaints, other than lightheadedness this morning.  Objective: Temp:  [97.9 F (36.6 C)-98.6 F (37 C)]  98.5 F (36.9 C) (08/02 0713) Pulse Rate:  [74-92] 83 (08/02 0713) Resp:  [17-29] 20 (08/02 0713) BP: (120-147)/(74-97) 139/94 (08/02 0713) SpO2:  [100 %] 100 % (08/02 0713) Weight:  [88.6 kg-89 kg] 88.6 kg (08/02 0500) General: Alert, well appearing, NAD HEENT: Atraumatic, mild sclera icterus CV: RRR, no murmurs, normal S1/S2 Pulm: CTAB, good WOB on RA, no crackles or wheezing Abd: Soft, no distension, no tenderness Skin: dry, warm Ext: No BLE edema, +2 Pedal and radial pulse.   Laboratory: Most recent CBC Lab Results  Component Value Date   WBC 16.8 (H) 11/26/2021   HGB 7.7 (L) 11/26/2021   HCT 22.6 (L) 11/26/2021   MCV 96.2 11/26/2021   PLT 36 (L) 11/26/2021   Most recent BMP    Latest Ref Rng & Units 11/26/2021    4:00 AM  BMP  Glucose 70 - 99 mg/dL 189   BUN 6 - 20 mg/dL 16   Creatinine 0.61 - 1.24 mg/dL 1.00   Sodium 135 - 145 mmol/L 139   Potassium 3.5 - 5.1 mmol/L 3.3   Chloride 98 - 111 mmol/L 108   CO2 22 - 32 mmol/L 27   Calcium 8.9 - 10.3 mg/dL 9.1     Imaging/Diagnostic Tests: No new images  Alen Bleacher, MD 11/26/2021, 9:22 AM  PGY-2, [place] Intern pager: [phone], text pages welcome Secure chat group [place]

## 2021-11-26 NOTE — TOC Initial Note (Signed)
Transition of Care [facility]) - Initial/Assessment Note    Patient Details  Name: Eric Hooper MRN: [id number] Date of Birth: [birth year]  [facility] of [facility]) CM/SW Contact:    Curlene Labrum, RN Phone Number: 11/26/2021, 11:26 AM  Clinical Narrative:                 CM met with the patient at the bedside to discuss transitions of care to home.  The patient states that he was admitted to the hospital will low platelets and would likely be discharged home on Saturday per the patient.  Plasma exchange RN is present in the room at this time.  The patient is independent at home with his wife.  CAGE assessment  completed and the patient was given resources for Outpatient counseling since the patient states that he socially drinks beer but plans on "quitting for health reasons".  The patient will discharge home by car with family and does not have TOC needs at this time.  CM will continue to follow the patient for discharge needs for home - pending discharge date not determined at this time.   Expected Discharge Plan: Home/Self Care Barriers to Discharge: Continued Medical Work up   Patient Goals and CMS Choice Patient states their goals for this hospitalization and ongoing recovery are:: Patient wants to get better and go home. CMS Medicare.gov Compare Post Acute Care list provided to:: Patient Choice offered to / list presented to : Patient  Expected Discharge Plan and Services Expected Discharge Plan: Home/Self Care   Discharge Planning Services: CM Consult   Living arrangements for the past 2 months: Single Family Home                                      Prior Living Arrangements/Services Living arrangements for the past 2 months: Single Family Home Lives with:: Significant Other Patient language and need for interpreter reviewed:: Yes Do you feel safe going back to the place where you live?: Yes      Need for Family Participation in Patient Care: Yes  (Comment) Care giver support system in place?: Yes (comment) Current home services: DME (Blood sugar meter at home.) Criminal Activity/Legal Involvement Pertinent to Current Situation/Hospitalization: No - Comment as needed  Activities of Daily Living Home Assistive Devices/Equipment: None ADL Screening (condition at time of admission) Patient's cognitive ability adequate to safely complete daily activities?: Yes Is the patient deaf or have difficulty hearing?: No Does the patient have difficulty seeing, even when wearing glasses/contacts?: No Does the patient have difficulty concentrating, remembering, or making decisions?: No Patient able to express need for assistance with ADLs?: Yes Does the patient have difficulty dressing or bathing?: No Independently performs ADLs?: Yes (appropriate for developmental age) Does the patient have difficulty walking or climbing stairs?: No Weakness of Legs: None Weakness of Arms/Hands: None  Permission Sought/Granted Permission sought to share information with : Case Manager, Family Supports          Permission granted to share info w Relationship: Drucilla Chalet, wife - [phone]     Emotional Assessment Appearance:: Appears stated age Attitude/Demeanor/Rapport: Gracious Affect (typically observed): Accepting Orientation: : Oriented to Self, Oriented to Place, Oriented to  Time, Oriented to Situation Alcohol / Substance Use: Not Applicable Psych Involvement: No (comment)  Admission diagnosis:  Thrombocytopenia ([place]) [D69.6] TTP (thrombotic thrombocytopenic purpura) (HCC) [M31.19] Acute cystitis with hematuria [N30.01] Hypertension, unspecified type [  I10] Patient Active Problem List   Diagnosis Date Noted   Urinary tract infection 11/22/2021   Acute cystitis with hematuria 11/22/2021   Cervical disc disorder with radiculopathy of cervical region 06/20/2020   Seasonal allergic rhinitis 03/05/2020   Chronic left shoulder pain 11/27/2019    Insomnia 06/09/2019   PICC (peripherally inserted central catheter) in place 09/05/2018   Chest wall pain 08/14/2018   At risk for dysfunction of heart 07/11/2018   Mixed hyperlipidemia 02/28/2018   Type 2 diabetes mellitus without complications ([place]) 80/04/2391   Phlebitis 05/12/2017   Leg swelling 05/12/2017   Tobacco use disorder 04/13/2017   GERD (gastroesophageal reflux disease)    Hypertension    TTP (thrombotic thrombocytopenic purpura) ([place]) 02/06/2017   Alcohol use 02/03/2017   Renal insufficiency 02/03/2017   Thrombocytopenia ([place]) 02/01/2017   Hypokalemia 02/01/2017   Macrocytic anemia 02/01/2017   Microscopic hematuria 02/01/2017   Diverticulosis 02/01/2017   PCP:  Rise Patience, DO Pharmacy:   [id number] [id number] [id number]: [phone]: [phone] [facility] [place] [place] [facility] [address] [id number]: [phone]: [phone]    Social Determinants of Health (SDOH) Interventions    Readmission Risk Interventions    11/26/2021   11:25 AM  Readmission Risk Prevention Plan  Post Dischage Appt Complete  Medication Screening Complete  Transportation Screening Complete

## 2021-11-26 NOTE — Progress Notes (Signed)
[facility] Progress Note   46 y.o. year-old w/ hx of DM2, TTP, HTN, HL, smoker who presented to ED w/ new onset gross hematuria. Pt has a hx of TTP and was treated here back in 2019 and in 2020 under care of Dr. Benay Spice. Labs here showed plt ct of 6, Hb 13.5, LDH 1687 and total bilirubin 12.7 (indirect 11.5). Pt seen by Dr. Lindi Adie of Hem-Onc who has ordered plasamapheresis w/ FFP x 7 days.     Assessment/ Plan:   Acute TTP - recurrent, per Dr Lindi Adie needs plasmapheresis as above. Tolerated 1st TPE Sun and  #2 Mon; plan is for total of 7 treatments. Monitor platelet levels and LDH. RIJ temp 7/30. He had #4/7 today; depending on platelet count should have the temp cath converted to a tunneled catheter on Fri. DM2 - per pmd HTN - per pmd  Subjective:   Urine clear now; denies dysuria, fever, chills, h/a, sob.   Objective:   BP (!) 146/101 (BP Location: Left Arm)   Pulse 75   Temp 97.8 F (36.6 C) (Oral)   Resp 20   Ht '5\' 10"'$  (1.778 m)   Wt 88.6 kg   SpO2 100%   BMI 28.03 kg/m   Intake/Output Summary (Last 24 hours) at 11/26/2021 1600 Last data filed at 11/26/2021 0800 Gross per 24 hour  Intake 240 ml  Output --  Net 240 ml   Weight change:   Physical Exam: Gen alert, no distress No rash, cyanosis or gangrene No jvd or bruits Chest clear bilat to bases RRR no MRG Abd soft ntnd no mass or ascites +bs Ext no LE or UE edema, no wounds or ulcers Neuro is alert, Ox 3 , nf  Imaging: No results found.  Labs: BMET Recent Labs  Lab 11/22/21 1544 11/23/21 0336 11/24/21 0341 11/24/21 1303 11/25/21 0325 11/25/21 1204 11/26/21 0400  NA 140 136 141 141 142 141 139  K 3.4* 3.9 3.3* 3.4* 3.3* 4.0 3.3*  CL 107 105 104 104 107 100 108  CO2 '25 22 30 29 30  '$ --  27  GLUCOSE 171* 254* 160* 194* 137* 269* 189*  BUN 22* 30* 36* 29* '19 19 16  '$ CREATININE 1.08 1.12 1.28* 1.13 1.00 1.00 1.00  CALCIUM 9.7 9.5 8.8* 8.7* 8.6*  --  9.1   CBC Recent Labs  Lab  11/22/21 2010 11/23/21 0336 11/24/21 0341 11/25/21 0325 11/25/21 1204 11/26/21 0400  WBC 10.5 8.0 14.5* 12.3*  --  16.8*  NEUTROABS 4.4 5.8 6.1  --   --  9.4*  HGB 12.9* 12.7* 9.9* 8.2* 8.5* 7.7*  HCT 35.8* 34.6* 27.9* 23.8* 25.0* 22.6*  MCV 90.9 89.9 91.2 94.8  --  96.2  PLT 5* <5* 6* 16*  --  36*    Medications:     amLODipine  5 mg Oral Daily   atorvastatin  40 mg Oral Daily   Chlorhexidine Gluconate Cloth  6 each Topical Daily   [START ON 11/27/2021] esomeprazole  20 mg Oral Daily   folic acid  1 mg Oral Daily   insulin aspart  0-15 Units Subcutaneous TID WC   losartan  50 mg Oral Daily   metFORMIN  1,000 mg Oral BID WC   [START ON 11/27/2021] pneumococcal 20-valent conjugate vaccine  0.5 mL Intramuscular Tomorrow-1000   potassium chloride  40 mEq Oral BID   predniSONE  80 mg Oral Q breakfast      [name], MD 11/26/2021, 4:00 PM

## 2021-11-27 ENCOUNTER — Encounter: Payer: Self-pay | Admitting: *Deleted

## 2021-11-27 ENCOUNTER — Other Ambulatory Visit: Payer: Self-pay | Admitting: Nurse Practitioner

## 2021-11-27 DIAGNOSIS — E119 Type 2 diabetes mellitus without complications: Secondary | ICD-10-CM | POA: Diagnosis not present

## 2021-11-27 DIAGNOSIS — M3119 Other thrombotic microangiopathy: Secondary | ICD-10-CM | POA: Diagnosis not present

## 2021-11-27 DIAGNOSIS — D696 Thrombocytopenia, unspecified: Secondary | ICD-10-CM | POA: Diagnosis not present

## 2021-11-27 LAB — THERAPEUTIC PLASMA EXCHANGE (BLOOD BANK)
Plasma Exchange: 4095
Plasma volume needed: 4095
Unit division: 0
Unit division: 0
Unit division: 0
Unit division: 0
Unit division: 0
Unit division: 0
Unit division: 0
Unit division: 0
Unit division: 0
Unit division: 0
Unit division: 0

## 2021-11-27 LAB — CBC WITH DIFFERENTIAL/PLATELET
Abs Immature Granulocytes: 0 10*3/uL (ref 0.00–0.07)
Band Neutrophils: 4 %
Basophils Absolute: 0 10*3/uL (ref 0.0–0.1)
Basophils Relative: 0 %
Eosinophils Absolute: 0 10*3/uL (ref 0.0–0.5)
Eosinophils Relative: 0 %
HCT: 23.6 % — ABNORMAL LOW (ref 39.0–52.0)
Hemoglobin: 7.8 g/dL — ABNORMAL LOW (ref 13.0–17.0)
Lymphocytes Relative: 31 %
Lymphs Abs: 5.2 10*3/uL — ABNORMAL HIGH (ref 0.7–4.0)
MCH: 32.6 pg (ref 26.0–34.0)
MCHC: 33.1 g/dL (ref 30.0–36.0)
MCV: 98.7 fL (ref 80.0–100.0)
Monocytes Absolute: 1.2 10*3/uL — ABNORMAL HIGH (ref 0.1–1.0)
Monocytes Relative: 7 %
Neutro Abs: 10.5 10*3/uL — ABNORMAL HIGH (ref 1.7–7.7)
Neutrophils Relative %: 58 %
Platelets: 76 10*3/uL — ABNORMAL LOW (ref 150–400)
RBC: 2.39 MIL/uL — ABNORMAL LOW (ref 4.22–5.81)
RDW: 19.8 % — ABNORMAL HIGH (ref 11.5–15.5)
WBC: 16.9 10*3/uL — ABNORMAL HIGH (ref 4.0–10.5)
nRBC: 1 /100 WBC — ABNORMAL HIGH
nRBC: 3.1 % — ABNORMAL HIGH (ref 0.0–0.2)

## 2021-11-27 LAB — GLUCOSE, CAPILLARY
Glucose-Capillary: 232 mg/dL — ABNORMAL HIGH (ref 70–99)
Glucose-Capillary: 210 mg/dL — ABNORMAL HIGH (ref 70–99)
Glucose-Capillary: 362 mg/dL — ABNORMAL HIGH (ref 70–99)

## 2021-11-27 LAB — COMPREHENSIVE METABOLIC PANEL
ALT: 39 U/L (ref 0–44)
AST: 28 U/L (ref 15–41)
Albumin: 3.2 g/dL — ABNORMAL LOW (ref 3.5–5.0)
Alkaline Phosphatase: 42 U/L (ref 38–126)
Anion gap: 7 (ref 5–15)
BUN: 17 mg/dL (ref 6–20)
CO2: 28 mmol/L (ref 22–32)
Calcium: 8 mg/dL — ABNORMAL LOW (ref 8.9–10.3)
Chloride: 105 mmol/L (ref 98–111)
Creatinine, Ser: 1.06 mg/dL (ref 0.61–1.24)
GFR, Estimated: >60 mL/min (ref >60)
Glucose, Bld: 126 mg/dL — ABNORMAL HIGH (ref 70–99)
Potassium: 3.3 mmol/L — ABNORMAL LOW (ref 3.5–5.1)
Sodium: 140 mmol/L (ref 135–145)
Total Bilirubin: 1.3 mg/dL — ABNORMAL HIGH (ref 0.3–1.2)
Total Protein: 5.2 g/dL — ABNORMAL LOW (ref 6.5–8.1)

## 2021-11-27 LAB — HEPATITIS B CORE ANTIBODY, TOTAL: Hep B Core Total Ab: NONREACTIVE

## 2021-11-27 LAB — MAGNESIUM: Magnesium: 1.5 mg/dL — ABNORMAL LOW (ref 1.7–2.4)

## 2021-11-27 LAB — HEMOGLOBIN A1C
Hgb A1c MFr Bld: 5.8 % — ABNORMAL HIGH (ref 4.8–5.6)
Mean Plasma Glucose: 119.76 mg/dL

## 2021-11-27 LAB — HEPATITIS B SURFACE ANTIGEN: Hepatitis B Surface Ag: NONREACTIVE

## 2021-11-27 LAB — LACTATE DEHYDROGENASE: LDH: 241 U/L — ABNORMAL HIGH (ref 98–192)

## 2021-11-27 MED ORDER — ANTICOAGULANT SODIUM CITRATE 4% (200MG/5ML) IV SOLN
5.0000 mL | Freq: Once | Status: DC
  Filled 2021-11-27: qty 5

## 2021-11-27 MED ORDER — GLIPIZIDE ER 5 MG PO TB24
5.0000 mg | ORAL_TABLET | Freq: Every day | ORAL | Status: DC
  Administered 2021-11-27 – 2021-11-29 (×3): 5 mg via ORAL
  Filled 2021-11-27 (×4): qty 1

## 2021-11-27 MED ORDER — SULFAMETHOXAZOLE-TRIMETHOPRIM 800-160 MG PO TABS
1.0000 | ORAL_TABLET | ORAL | Status: DC
  Administered 2021-11-28: 1 via ORAL
  Filled 2021-11-27: qty 1

## 2021-11-27 MED ORDER — ANTICOAGULANT SODIUM CITRATE 4% (200MG/5ML) IV SOLN
5.0000 mL | Freq: Once | Status: AC
Start: 2021-11-27 — End: 2021-11-27
  Administered 2021-11-27: 5 mL
  Filled 2021-11-27: qty 5

## 2021-11-27 MED ORDER — POTASSIUM CHLORIDE CRYS ER 20 MEQ PO TBCR
40.0000 meq | EXTENDED_RELEASE_TABLET | Freq: Two times a day (BID) | ORAL | Status: AC
  Administered 2021-11-27: 40 meq via ORAL
  Filled 2021-11-27: qty 2

## 2021-11-27 MED ORDER — ACETAMINOPHEN 325 MG PO TABS: 650.0000 mg | ORAL_TABLET | ORAL | Status: DC | PRN

## 2021-11-27 MED ORDER — PNEUMOCOCCAL 20-VAL CONJ VACC 0.5 ML IM SUSY
0.5000 mL | PREFILLED_SYRINGE | INTRAMUSCULAR | Status: AC
  Administered 2021-11-29: 0.5 mL via INTRAMUSCULAR
  Filled 2021-11-27: qty 0.5

## 2021-11-27 MED ORDER — MAGNESIUM SULFATE 4 GM/100ML IV SOLN
4.0000 g | Freq: Once | INTRAVENOUS | Status: AC
  Administered 2021-11-27: 4 g via INTRAVENOUS
  Filled 2021-11-27: qty 100

## 2021-11-27 MED ORDER — CALCIUM CARBONATE ANTACID 500 MG PO CHEW
2.0000 | CHEWABLE_TABLET | ORAL | Status: AC
  Administered 2021-11-27: 400 mg via ORAL
  Filled 2021-11-27: qty 2

## 2021-11-27 MED ORDER — CALCIUM GLUCONATE-NACL 2-0.675 GM/100ML-% IV SOLN: 2.0000 g | Freq: Once | INTRAVENOUS | Status: DC

## 2021-11-27 MED ORDER — ACD FORMULA A 0.73-2.45-2.2 GM/100ML VI SOLN
1000.0000 mL | Status: DC
Start: 2021-11-27 — End: 2021-11-27

## 2021-11-27 MED ORDER — DIPHENHYDRAMINE HCL 25 MG PO CAPS: 25.0000 mg | ORAL_CAPSULE | Freq: Four times a day (QID) | ORAL | Status: DC | PRN

## 2021-11-27 MED ORDER — POTASSIUM CHLORIDE CRYS ER 20 MEQ PO TBCR
40.0000 meq | EXTENDED_RELEASE_TABLET | Freq: Two times a day (BID) | ORAL | Status: DC
  Administered 2021-11-27: 40 meq via ORAL
  Filled 2021-11-27: qty 2

## 2021-11-27 NOTE — Progress Notes (Signed)
HEMATOLOGY-ONCOLOGY PROGRESS NOTE  ASSESSMENT AND PLAN: 1.  TTP initially diagnosed in October 2018 with recurrence in April 2020 and July 2023. Initiation of daily plasma exchange and Solu-Medrol 02/02/2017 [name] Antibody- 72, activity less than 2% on initial presentation; [name] from 08/15/2018 <2.0. Last plasma exchange 02/06/2017 Prednisone 60 mg daily beginning 02/08/2017 Prednisone 40 mg daily beginning 02/17/2017 Prednisone 30 mg daily beginning 03/09/2017 Prednisone 20 mg daily beginning 03/23/2017 Prednisone 10 mg daily beginning 04/01/2017 Prednisone 5 mg daily for 5 days then 5 mg every other day for 5 doses then stop beginning 05/11/2017 Recurrent TTP 08/14/2018 Plasma exchange/steroids started on 08/15/2018.  Plasma exchange discontinued after treatment on 08/19/2019, discharged on prednisone '60mg'$  daily Prednisone taper to 40 mg daily 08/22/2018 Weekly rituximab for 4 doses beginning 08/25/2018 Plasma exchange resumed 08/26/2018, 08/27/2018, 08/28/2018, 08/29/2018, 08/31/2018, 09/02/2018 Prednisone taper to 30 mg daily 09/01/2018 Cycle 3 weekly Rituxan 09/08/2018 Prednisone taper to 20 mg daily 09/08/2018 Cycle 4 weekly Rituxan 09/15/2018 Prednisone taper to 10 mg daily 09/20/2018 Prednisone taper to 5 mg daily 09/27/2018 Prednisone discontinued 10/07/2018 Admission with relapse of TTP 10/23/2021 Daily Plasma exchange initiated 11/23/2021 - prednisone 8/1 2. Xiphoid pain-etiology unclear 3. Hypertension 4. Diabetes 5. Alcohol/Tobacco use 6. History of renal insufficiency 7. Right IJ dialysis catheter placed 02/02/2017, removed New right IJ dialysis catheter 08/15/2018 Catheter removed 09/12/2018 New right IJ dialysis catheter placed 11/23/2021  Mr. Befort has completed 4 daily plasma exchange procedures.  The platelet count and markers of hemolysis continue to improve.  The hemoglobin is stable today.    Recommendations: 1.  Continue daily plasma exchange until the platelet count is  normal. 2.  Monitor CBC, CMP, LDH daily. 3.  Continue prednisone-slow outpatient taper 4.  Monitor CBG while on prednisone 5.  Continue folic acid 6.  Outpatient follow-up will be scheduled at the Cancer center for next week with the plan to initiate weekly rituximab 7.  I anticipate discharge to home following plasmapheresis on 11/29/2021, will plan for outpatient plasmapheresis on Monday, Wednesday, and Friday for 1-2 weeks  [name]    SUBJECTIVE: He has mild dizziness since starting prednisone.  He is ambulating.  No allergic reaction during the plasmapheresis.  No bleeding.  PHYSICAL EXAMINATION:  Vitals:   11/27/21 1303 11/27/21 1308  BP: 129/67 124/78  Pulse:    Resp: 20 20  Temp: 98.4 F (36.9 C) 98.1 F (36.7 C)  SpO2: 99% 100%   Filed Weights   11/25/21 1207 11/26/21 0500 11/27/21 0500  Weight: 196 lb 3.4 oz (89 kg) 195 lb 5.2 oz (88.6 kg) 197 lb 8.5 oz (89.6 kg)    Intake/Output from previous day: 08/02 0701 - 08/03 0700 In: 2725 [P.O.:720; IV Piggyback:2005] Out: -   Physical Exam: HEENT: No thrush, mild scleral icterus Lungs: clear bilaterally CV: RRR ABD: No HSM, nontender Vascular: No leg edema  LABORATORY DATA:  I have reviewed the data as listed    Latest Ref Rng & Units 11/27/2021    3:03 AM 11/26/2021    4:00 AM 11/25/2021   12:04 PM  CMP  Glucose 70 - 99 mg/dL 126  189  269   BUN 6 - 20 mg/dL '17  16  19   '$ Creatinine 0.61 - 1.24 mg/dL 1.06  1.00  1.00   Sodium 135 - 145 mmol/L 140  139  141   Potassium 3.5 - 5.1 mmol/L 3.3  3.3  4.0   Chloride 98 - 111 mmol/L 105  108  100  CO2 22 - 32 mmol/L 28  27    Calcium 8.9 - 10.3 mg/dL 8.0  9.1    Total Protein 6.5 - 8.1 g/dL 5.2  5.6    Total Bilirubin 0.3 - 1.2 mg/dL 1.3  2.2    Alkaline Phos 38 - 126 U/L 42  49    AST 15 - 41 U/L 28  26    ALT 0 - 44 U/L 39  35      Lab Results  Component Value Date   WBC 16.9 (H) 11/27/2021   HGB 7.8 (L) 11/27/2021   HCT 23.6 (L) 11/27/2021   MCV 98.7  11/27/2021   PLT 76 (L) 11/27/2021   NEUTROABS 10.5 (H) 11/27/2021   Peripheral blood smear 10/23/2021: The platelets are markedly decreased, rare large platelet.  No platelet clumps.  The white cell morphology is unremarkable.  The polychromasia is increased.  Numerous schistocytes.  IR Fluoro Guide CV Line Right  Result Date: 11/23/2021 INDICATION: TTP requiring plasmapheresis EXAM: Temporary dialysis catheter placement MEDICATIONS: None ANESTHESIA/SEDATION: None FLUOROSCOPY: Radiation Exposure Index (as provided by the fluoroscopic device): 1 mGy Kerma COMPLICATIONS: None immediate. PROCEDURE: Informed written consent was obtained from the patient after a thorough discussion of the procedural risks, benefits and alternatives. All questions were addressed. Maximal Sterile Barrier Technique was utilized including caps, mask, sterile gowns, sterile gloves, sterile drape, hand hygiene and skin antiseptic. A timeout was performed prior to the initiation of the procedure. Right neck prepped and draped in the usual sterile fashion. All elements of maximal sterile barrier were utilized including, cap, mask, sterile gown, sterile gloves, large sterile drape, hand scrubbing and 2% Chlorhexidine for skin cleaning. The right internal jugular vein was evaluated with ultrasound and shown to be patent. A permanent ultrasound image was obtained and placed in the patient's medical record. Using sterile gel and a sterile probe cover, the right internal jugular vein was entered with a 21 ga needle during real time ultrasound guidance. 0.018 inch guidewire placed and 21 ga needle exchanged for transitional dilator set. Utilizing fluoroscopy, 0.035 inch guidewire advanced through the transitional dilator the level of the IVC without difficulty. Serial dilation performed, and catheter inserted over the guidewire. The tip was positioned in the right atrium. All lumens of the catheter aspirated and flushed well. The dialysis  lumens were locked with Heparin. The catheter was secured to the skin with suture. The insertion site was covered with sterile dressing. IMPRESSION: 15 cm temporary right IJ Trialysis catheter is ready for use. Electronically Signed   By: Miachel Roux M.D.   On: 11/23/2021 09:51   IR US Guide Vasc Access Right  Result Date: 11/23/2021 INDICATION: TTP requiring plasmapheresis EXAM: Temporary dialysis catheter placement MEDICATIONS: None ANESTHESIA/SEDATION: None FLUOROSCOPY: Radiation Exposure Index (as provided by the fluoroscopic device): 1 mGy Kerma COMPLICATIONS: None immediate. PROCEDURE: Informed written consent was obtained from the patient after a thorough discussion of the procedural risks, benefits and alternatives. All questions were addressed. Maximal Sterile Barrier Technique was utilized including caps, mask, sterile gowns, sterile gloves, sterile drape, hand hygiene and skin antiseptic. A timeout was performed prior to the initiation of the procedure. Right neck prepped and draped in the usual sterile fashion. All elements of maximal sterile barrier were utilized including, cap, mask, sterile gown, sterile gloves, large sterile drape, hand scrubbing and 2% Chlorhexidine for skin cleaning. The right internal jugular vein was evaluated with ultrasound and shown to be patent. A permanent ultrasound image was obtained and  placed in the patient's medical record. Using sterile gel and a sterile probe cover, the right internal jugular vein was entered with a 21 ga needle during real time ultrasound guidance. 0.018 inch guidewire placed and 21 ga needle exchanged for transitional dilator set. Utilizing fluoroscopy, 0.035 inch guidewire advanced through the transitional dilator the level of the IVC without difficulty. Serial dilation performed, and catheter inserted over the guidewire. The tip was positioned in the right atrium. All lumens of the catheter aspirated and flushed well. The dialysis lumens were  locked with Heparin. The catheter was secured to the skin with suture. The insertion site was covered with sterile dressing. IMPRESSION: 15 cm temporary right IJ Trialysis catheter is ready for use. Electronically Signed   By: Miachel Roux M.D.   On: 11/23/2021 09:51   CT Abdomen Pelvis W Contrast  Result Date: 11/22/2021 CLINICAL DATA:  Acute abdominal pain.  Hematuria. EXAM: CT ABDOMEN AND PELVIS WITH CONTRAST TECHNIQUE: Multidetector CT imaging of the abdomen and pelvis was performed using the standard protocol following bolus administration of intravenous contrast. RADIATION DOSE REDUCTION: This exam was performed according to the departmental dose-optimization program which includes automated exposure control, adjustment of the mA and/or kV according to patient size and/or use of iterative reconstruction technique. CONTRAST:  12m OMNIPAQUE IOHEXOL 300 MG/ML  SOLN COMPARISON:  Noncontrast CT on 12/21/2017 FINDINGS: Lower Chest: No acute findings. Hepatobiliary: No hepatic masses identified. Gallbladder is unremarkable. No evidence of biliary ductal dilatation. Pancreas:  No mass or inflammatory changes. Spleen: Within normal limits in size and appearance. Adrenals/Urinary Tract: No evidence of ureteral calculi or hydronephrosis. Symmetric perinephric soft tissue stranding is seen bilaterally with extension inferiorly into pelvis. No renal masses or other parenchymal lesions are identified. Urinary bladder is unremarkable. Stomach/Bowel: No evidence of obstruction, inflammatory process or abnormal fluid collections. Vascular/Lymphatic: No pathologically enlarged lymph nodes. No acute vascular findings. Reproductive:  No mass or other significant abnormality. Other:  None. Musculoskeletal:  No suspicious bone lesions identified. IMPRESSION: Bilateral perinephric soft tissue stranding, with extension into pelvis. No evidence of hydronephrosis. This is nonspecific, and differential diagnosis includes bilateral  pyelonephritis, vesicoureteral reflux, and medical renal disease. Recommend correlation with urinalysis. Electronically Signed   By: JMarlaine HindM.D.   On: 11/22/2021 18:56     No future appointments.    LOS: 4 days

## 2021-11-27 NOTE — Progress Notes (Signed)
Per Dr. Benay Spice: Soon to discharge from hospital with plasma pheresis being arranged for next week at [facility] on MWF. Will need OV with Rituxan on 8/10, followed by weekly rituxan for total of 4 weeks. High priority scheduling message sent.

## 2021-11-27 NOTE — Progress Notes (Addendum)
Daily Progress Note Intern Pager: [phone]  Patient name: Eric Hooper Medical record number: [id number] Date of birth: 1976/04/26 Age: 46 y.o. Gender: male  Primary Care Provider: Rise Patience, DO Consultants: Nephrology, IR, heme-onc Code Status: Full  Pt Overview and Major Events to Date:  7/29-admitted 7/30-IR placed catheter, first plasmapheresis section  Assessment and Plan: Eric Hooper is a 46 year old male past medical of TTP who presented with hematuria in the setting of TTP. Pertinent PMH/PSH includes TTP, HTN and T2DM.   * TTP (thrombotic thrombocytopenic purpura) (HCC) Platelet improved to 76 and LDH downtrending at 241.  Patient is on high-dose steroid likely to continue beyond 2 weeks, we will start patient on PCP prophylaxis with Bactrim. - Heme-onc following, appreciate recommendations - Daily plasmapheresis w/ FFP replacement (day 5 of 7) -Continue '80mg'$  prednisone daily, will taper when appriopriate - Continue protonix '20mg'$  daily  -Started on daily folic acid, per Heme  - Monitor LDH levels daily  - Started Bactrium '160mg'$  3x/weekly (first dose 8/4) - Avoid NSAID use - SCDs -Consider starting rituximab outpatient  Type 2 diabetes mellitus without complications ([place]) CBG elevated in the setting of high-dose steroid.  Patient's home metformin 1000 mg twice daily was restarted yesterday.  He is currently on moderate sliding scale insulin and meal coverage 3 units NovoLog 3 times daily.  We will continue to monitor CBGs and make appropriate adjustments.  -Increased to moderate sliding scale. -Started glipizide 5 mg daily -Continue home metformin 1000 mg twice daily -Meal time NovoLog 3 units 3 times daily - Monitor CBGs  Hypertension Stable. home medication include amlodipine '5mg'$ , Losartan '50mg'$  - continue home meds   Hypokalemia K 3.3 this AM - 80 mg K repletion  - BMP daily    FEN/GI: Regular PPx: SCDs Dispo:Home pending clinical improvement .  Barriers include plasmapheresis treatment.   Subjective:  Patient report he is feeling well, has no complaint other than intermittent lightheadedness which he said was present in the past with steroid treatment.   Objective: Temp:  [97.5 F (36.4 C)-98.8 F (37.1 C)] 97.5 F (36.4 C) (08/03 0812) Pulse Rate:  [72-91] 82 (08/03 0812) Resp:  [16-20] 16 (08/03 0812) BP: (114-146)/(70-101) 114/81 (08/03 0812) SpO2:  [97 %-100 %] 100 % (08/03 0812) Weight:  [89.6 kg] 89.6 kg (08/03 0500) Physical Exam: General: Alert, well appearing, NAD HEENT: Atraumatic, MMM, No sclera icterus CV: RRR, no murmurs, normal S1/S2 Pulm: CTAB, good WOB on RA, no crackles or wheezing Abd: Soft, no distension, no tenderness Skin: dry, warm Ext: No BLE edema, +2 Pedal and radial pulse.   Laboratory: Most recent CBC Lab Results  Component Value Date   WBC 16.9 (H) 11/27/2021   HGB 7.8 (L) 11/27/2021   HCT 23.6 (L) 11/27/2021   MCV 98.7 11/27/2021   PLT 76 (L) 11/27/2021   Most recent BMP    Latest Ref Rng & Units 11/27/2021    3:03 AM  BMP  Glucose 70 - 99 mg/dL 126   BUN 6 - 20 mg/dL 17   Creatinine 0.61 - 1.24 mg/dL 1.06   Sodium 135 - 145 mmol/L 140   Potassium 3.5 - 5.1 mmol/L 3.3   Chloride 98 - 111 mmol/L 105   CO2 22 - 32 mmol/L 28   Calcium 8.9 - 10.3 mg/dL 8.0     Imaging/Diagnostic Tests: No new images  Eric Bleacher, MD 11/27/2021, 11:26 AM  PGY-2, [place] Intern pager: [phone], text pages welcome  Secure chat group [facility] Teaching Service

## 2021-11-27 NOTE — Discharge Planning (Signed)
Oncology Discharge Planning Note  [facility] Address: [address] [place], [place], [place] [address] Hours of Operation:  Nena Polio, Monday - Friday  Clinic Contact Information:  [phone]) [phone]  Oncology Care Team: Medical Oncologist:  Dr. [name]  Patient Details: Name:  Eric Hooper, Eric Hooper MRN:   [id number] DOB:   Jun 13, 1975 Reason for Current Admission: TTP (thrombotic thrombocytopenic purpura) [facility])  Discharge Planning Narrative: Notification of admission received by Dr. Benay Spice for [place].  Discharge follow-up appointments for oncology are current and available on the AVS and MyChart.   Upon discharge from the hospital, hematology/oncology's post discharge plan of care for the outpatient setting is: Lab/office on 12/04/21 with beginning of weekly rituxan x 4 weeks. Mr. Hermiz is aware.   Hameed Luba will be called within two business days after discharge to review hematology/oncology's plan of care for full understanding.    Outpatient Oncology Specific Care Only: Oncology appointment transportation needs addressed?:  His wife will transport Oncology medication management for symptom management addressed?:  no Chemo Alert Card reviewed?:  not applicable Immunotherapy Alert Card reviewed?:  not applicable

## 2021-11-27 NOTE — Discharge Instructions (Addendum)
Dear Eric Hooper,   Thank you for letting us participate in your care! In this section, you will find a brief hospital admission summary of why you were admitted to the hospital and post-hospital plan.  You were admitted because you had a flair up of thrombotic thrombocytopenic purpura (TTP) and your platelet was very low.  You were treated with plasmapheresis and high dose steroid  POST-HOSPITAL & CARE INSTRUCTIONS Taking glipizide 5 mg daily and monitor your blood glucose closely Please take the 160 mg Bactrim 3 times weekly (MWF) Take your daily potassium supplement  Please let PCP/Specialists know of any changes in medications that were made.  Please see medications section of this packet for any medication changes.  Please follow-up with the oncologist, information provided below.  DOCTOR'S APPOINTMENTS & FOLLOW UP Future Appointments  Date Time Provider [place]  12/04/2021  8:00 AM [facility] PHLEBOTOMIST [facility] None  12/04/2021  8:15 AM Eric Shark, NP [facility] None  12/04/2021  9:15 AM [facility] CHAIR 3 [facility] None  12/11/2021  9:30 AM [facility] PHLEBOTOMIST [facility] None  12/11/2021 10:30 AM [facility] CHAIR 1 [facility] None  12/18/2021  9:30 AM [facility] PHLEBOTOMIST [facility] None  12/18/2021 10:30 AM [facility] CHAIR 1 [facility] None  12/25/2021  9:30 AM [facility] PHLEBOTOMIST [facility] None  12/25/2021 10:30 AM [facility] CHAIR 3 [facility] None    Thank you for choosing [facility]! Take care and be well!  [facility]  [place], [place] [address] [phone]    PER MEDICAL ONCOLOGY: Follow up with Dr. Gearldine Hooper office on 12/04/21 at 0800 for lab/office visit and to initiate rituximab weekly x weeks. Call office with any bleeding at [phone]. New location of [address].

## 2021-11-27 NOTE — Progress Notes (Signed)
[name] Progress Note   46 y.o. year-old w/ hx of DM2, TTP, HTN, HL, smoker who presented to ED w/ new onset gross hematuria. Pt has a hx of TTP and was treated here back in 2019 and in 2020 under care of Dr. Benay Spice. Labs here showed plt ct of 6, Hb 13.5, LDH 1687 and total bilirubin 12.7 (indirect 11.5). Pt seen by Dr. Lindi Adie of Hem-Onc who has ordered plasamapheresis w/ FFP x 7 days.     Assessment/ Plan:   Acute TTP - recurrent, per Dr Lindi Adie needs plasmapheresis as above. Tolerated 1st TPE Sun and  #2 Mon; plan is for total of 7 treatments. Monitor platelet levels and LDH. RIJ temp 7/30. He had #4/7 today Seen on #5/7 TPE with FFP replacement  Discussed with VIR and as long as platelet count greater than 50K can convert to TC. Will request conversion for Fri in case he can be d/c after 7/7 TPE on Sat.  DM2 - per pmd HTN - per pmd  Subjective:   Urine clear now; denies dysuria, fever, chills, h/a, sob.   Objective:   BP 114/81 (BP Location: Left Arm)   Pulse 82   Temp (!) 97.5 F (36.4 C) (Oral)   Resp 16   Ht '5\' 10"'$  (1.778 m)   Wt 89.6 kg   SpO2 100%   BMI 28.34 kg/m   Intake/Output Summary (Last 24 hours) at 11/27/2021 1209 Last data filed at 11/26/2021 1800 Gross per 24 hour  Intake 2245 ml  Output --  Net 2245 ml   Weight change: 0.6 kg  Physical Exam: Gen alert, no distress No rash, cyanosis or gangrene No jvd or bruits Chest clear bilat to bases RRR no MRG Abd soft ntnd no mass or ascites +bs Ext no LE or UE edema, no wounds or ulcers Neuro is alert, Ox 3 , nf  Imaging: No results found.  Labs: BMET Recent Labs  Lab 11/22/21 1544 11/23/21 0336 11/24/21 0341 11/24/21 1303 11/25/21 0325 11/25/21 1204 11/26/21 0400 11/27/21 0303  NA 140 136 141 141 142 141 139 140  K 3.4* 3.9 3.3* 3.4* 3.3* 4.0 3.3* 3.3*  CL 107 105 104 104 107 100 108 105  CO2 '25 22 30 29 30  '$ --  27 28  GLUCOSE 171* 254* 160* 194* 137* 269* 189* 126*  BUN  22* 30* 36* 29* '19 19 16 17  '$ CREATININE 1.08 1.12 1.28* 1.13 1.00 1.00 1.00 1.06  CALCIUM 9.7 9.5 8.8* 8.7* 8.6*  --  9.1 8.0*   CBC Recent Labs  Lab 11/23/21 0336 11/24/21 0341 11/25/21 0325 11/25/21 1204 11/26/21 0400 11/27/21 0303  WBC 8.0 14.5* 12.3*  --  16.8* 16.9*  NEUTROABS 5.8 6.1  --   --  9.4* 10.5*  HGB 12.7* 9.9* 8.2* 8.5* 7.7* 7.8*  HCT 34.6* 27.9* 23.8* 25.0* 22.6* 23.6*  MCV 89.9 91.2 94.8  --  96.2 98.7  PLT <5* 6* 16*  --  36* 76*    Medications:     amLODipine  5 mg Oral Daily   atorvastatin  40 mg Oral Daily   calcium carbonate  2 tablet Oral Q3H   Chlorhexidine Gluconate Cloth  6 each Topical Daily   esomeprazole  20 mg Oral Daily   folic acid  1 mg Oral Daily   glipiZIDE  5 mg Oral Q breakfast   insulin aspart  0-15 Units Subcutaneous TID WC   insulin aspart  3 Units Subcutaneous TID  WC   losartan  50 mg Oral Daily   metFORMIN  1,000 mg Oral BID WC   [START ON 11/29/2021] pneumococcal 20-valent conjugate vaccine  0.5 mL Intramuscular Tomorrow-1000   potassium chloride  40 mEq Oral BID   predniSONE  80 mg Oral Q breakfast   [START ON 11/28/2021] sulfamethoxazole-trimethoprim  1 tablet Oral Once per day on Mon Wed Fri      Eric Santee, MD 11/27/2021, 12:09 PM

## 2021-11-27 NOTE — Inpatient Diabetes Management (Signed)
Inpatient Diabetes Program Recommendations  AACE/ADA: New Consensus Statement on Inpatient Glycemic Control (2015)  Target Ranges:  Prepandial:   less than 140 mg/dL      Peak postprandial:   less than 180 mg/dL (1-2 hours)      Critically ill patients:  140 - 180 mg/dL   Lab Results  Component Value Date   GLUCAP 232 (H) 11/27/2021   HGBA1C 7.6 (H) 06/18/2021    Review of Glycemic Control  Latest Reference Range & Units 11/26/21 07:13 11/26/21 11:29 11/26/21 17:12 11/26/21 21:28 11/27/21 08:15  Glucose-Capillary 70 - 99 mg/dL 165 (H) 248 (H) 382 (H) 247 (H) 232 (H)  (H): Data is abnormally high  Diabetes history: DM2 Outpatient Diabetes medications: Metformin 1000 mg BID Current orders for Inpatient glycemic control: Novolog 0-15 units units TID and 3 units TID with meals, Metformiin 1000 mg TID, Prednisone 80 mg QD  Inpatient Diabetes Program Recommendations:    While on steroids, please consider :  Novolog 0-20 units TID and HS  Will continue to follow while inpatient.  Thank you, Reche Dixon, MSN, [place] [facility] Coordinator Inpatient Diabetes Program [phone] (team pager from 8a-5p)

## 2021-11-28 ENCOUNTER — Other Ambulatory Visit (HOSPITAL_COMMUNITY): Payer: Self-pay

## 2021-11-28 ENCOUNTER — Inpatient Hospital Stay (HOSPITAL_COMMUNITY): Payer: 59

## 2021-11-28 ENCOUNTER — Encounter: Payer: Self-pay | Admitting: Oncology

## 2021-11-28 DIAGNOSIS — M3119 Other thrombotic microangiopathy: Secondary | ICD-10-CM | POA: Diagnosis not present

## 2021-11-28 HISTORY — PX: IR FLUORO GUIDE CV LINE RIGHT: IMG2283

## 2021-11-28 LAB — GLUCOSE, CAPILLARY
Glucose-Capillary: 83 mg/dL (ref 70–99)
Glucose-Capillary: 232 mg/dL — ABNORMAL HIGH (ref 70–99)
Glucose-Capillary: 203 mg/dL — ABNORMAL HIGH (ref 70–99)

## 2021-11-28 LAB — CBC WITH DIFFERENTIAL/PLATELET
Abs Immature Granulocytes: 0.3 10*3/uL — ABNORMAL HIGH (ref 0.00–0.07)
Basophils Absolute: 0 10*3/uL (ref 0.0–0.1)
Basophils Relative: 0 %
Eosinophils Absolute: 0.1 10*3/uL (ref 0.0–0.5)
Eosinophils Relative: 1 %
HCT: 25 % — ABNORMAL LOW (ref 39.0–52.0)
Hemoglobin: 8 g/dL — ABNORMAL LOW (ref 13.0–17.0)
Immature Granulocytes: 2 %
Lymphocytes Relative: 31 %
Lymphs Abs: 5.3 10*3/uL — ABNORMAL HIGH (ref 0.7–4.0)
MCH: 32.7 pg (ref 26.0–34.0)
MCHC: 32 g/dL (ref 30.0–36.0)
MCV: 102 fL — ABNORMAL HIGH (ref 80.0–100.0)
Monocytes Absolute: 1.6 10*3/uL — ABNORMAL HIGH (ref 0.1–1.0)
Monocytes Relative: 9 %
Neutro Abs: 9.7 10*3/uL — ABNORMAL HIGH (ref 1.7–7.7)
Neutrophils Relative %: 57 %
Platelets: 131 10*3/uL — ABNORMAL LOW (ref 150–400)
RBC: 2.45 MIL/uL — ABNORMAL LOW (ref 4.22–5.81)
RDW: 20.3 % — ABNORMAL HIGH (ref 11.5–15.5)
WBC: 17 10*3/uL — ABNORMAL HIGH (ref 4.0–10.5)
nRBC: 4.3 % — ABNORMAL HIGH (ref 0.0–0.2)

## 2021-11-28 LAB — THERAPEUTIC PLASMA EXCHANGE (BLOOD BANK)
Plasma Exchange: 3882
Plasma volume needed: 3882
Unit division: 0
Unit division: 0
Unit division: 0
Unit division: 0
Unit division: 0
Unit division: 0
Unit division: 0
Unit division: 0
Unit division: 0
Unit division: 0
Unit division: 0
Unit division: 0

## 2021-11-28 LAB — LACTATE DEHYDROGENASE: LDH: 187 U/L (ref 98–192)

## 2021-11-28 LAB — COMPREHENSIVE METABOLIC PANEL
ALT: 34 U/L (ref 0–44)
AST: 21 U/L (ref 15–41)
Albumin: 3.2 g/dL — ABNORMAL LOW (ref 3.5–5.0)
Alkaline Phosphatase: 49 U/L (ref 38–126)
Anion gap: 7 (ref 5–15)
BUN: 15 mg/dL (ref 6–20)
CO2: 27 mmol/L (ref 22–32)
Calcium: 8.1 mg/dL — ABNORMAL LOW (ref 8.9–10.3)
Chloride: 106 mmol/L (ref 98–111)
Creatinine, Ser: 0.99 mg/dL (ref 0.61–1.24)
GFR, Estimated: >60 mL/min (ref >60)
Glucose, Bld: 97 mg/dL (ref 70–99)
Potassium: 3.3 mmol/L — ABNORMAL LOW (ref 3.5–5.1)
Sodium: 140 mmol/L (ref 135–145)
Total Bilirubin: 0.8 mg/dL (ref 0.3–1.2)
Total Protein: 5.2 g/dL — ABNORMAL LOW (ref 6.5–8.1)

## 2021-11-28 LAB — ADAMTS13 ANTIBODY: ADAMTS13 Antibody: 5 Units/mL (ref <12)

## 2021-11-28 LAB — MAGNESIUM: Magnesium: 2.1 mg/dL (ref 1.7–2.4)

## 2021-11-28 MED ORDER — PREDNISONE 20 MG PO TABS
60.0000 mg | ORAL_TABLET | Freq: Every day | ORAL | 0 refills | Status: DC
  Filled 2021-11-28: qty 30, 10d supply, fill #0

## 2021-11-28 MED ORDER — CALCIUM GLUCONATE-NACL 2-0.675 GM/100ML-% IV SOLN
2.0000 g | Freq: Once | INTRAVENOUS | Status: AC
  Administered 2021-11-28: 2000 mg via INTRAVENOUS
  Filled 2021-11-28: qty 100

## 2021-11-28 MED ORDER — MIDAZOLAM HCL 2 MG/2ML IJ SOLN
INTRAMUSCULAR | Status: AC | PRN
  Administered 2021-11-28: 1 mg via INTRAVENOUS
  Administered 2021-11-28: .5 mg via INTRAVENOUS

## 2021-11-28 MED ORDER — FOLIC ACID 1 MG PO TABS
1.0000 mg | ORAL_TABLET | Freq: Every day | ORAL | 0 refills | Status: DC
  Filled 2021-11-28: qty 30, 30d supply, fill #0

## 2021-11-28 MED ORDER — POTASSIUM CHLORIDE CRYS ER 20 MEQ PO TBCR
40.0000 meq | EXTENDED_RELEASE_TABLET | Freq: Two times a day (BID) | ORAL | Status: AC
Start: 2021-11-28 — End: 2021-11-28
  Administered 2021-11-28 (×2): 40 meq via ORAL
  Filled 2021-11-28: qty 2

## 2021-11-28 MED ORDER — CEFAZOLIN SODIUM-DEXTROSE 2-4 GM/100ML-% IV SOLN
INTRAVENOUS | Status: AC | PRN
  Administered 2021-11-28: 2 g via INTRAVENOUS

## 2021-11-28 MED ORDER — PREDNISONE 20 MG PO TABS
60.0000 mg | ORAL_TABLET | Freq: Every day | ORAL | Status: DC
  Administered 2021-11-29: 60 mg via ORAL
  Filled 2021-11-28: qty 3

## 2021-11-28 MED ORDER — INSULIN ASPART 100 UNIT/ML IJ SOLN
0.0000 [IU] | Freq: Three times a day (TID) | INTRAMUSCULAR | Status: DC
  Administered 2021-11-28 – 2021-11-29 (×2): 5 [IU] via SUBCUTANEOUS
  Administered 2021-11-29: 2 [IU] via SUBCUTANEOUS

## 2021-11-28 MED ORDER — CALCIUM CARBONATE ANTACID 500 MG PO CHEW
2.0000 | CHEWABLE_TABLET | ORAL | Status: DC
  Administered 2021-11-28: 400 mg via ORAL
  Filled 2021-11-28: qty 2

## 2021-11-28 MED ORDER — LIDOCAINE-EPINEPHRINE 1 %-1:100000 IJ SOLN
INTRAMUSCULAR | Status: AC
  Filled 2021-11-28: qty 1

## 2021-11-28 MED ORDER — DIPHENHYDRAMINE HCL 25 MG PO CAPS: 25.0000 mg | ORAL_CAPSULE | Freq: Four times a day (QID) | ORAL | Status: DC | PRN

## 2021-11-28 MED ORDER — FENTANYL CITRATE (PF) 100 MCG/2ML IJ SOLN
INTRAMUSCULAR | Status: AC
  Filled 2021-11-28: qty 4

## 2021-11-28 MED ORDER — HEPARIN SODIUM (PORCINE) 1000 UNIT/ML IJ SOLN
INTRAMUSCULAR | Status: AC
  Filled 2021-11-28: qty 10

## 2021-11-28 MED ORDER — ACD FORMULA A 0.73-2.45-2.2 GM/100ML VI SOLN
Status: AC
  Administered 2021-11-28: 1000 mL
  Filled 2021-11-28: qty 1000

## 2021-11-28 MED ORDER — POTASSIUM CHLORIDE CRYS ER 20 MEQ PO TBCR: 40.0000 meq | EXTENDED_RELEASE_TABLET | Freq: Once | ORAL | Status: DC

## 2021-11-28 MED ORDER — SULFAMETHOXAZOLE-TRIMETHOPRIM 800-160 MG PO TABS
1.0000 | ORAL_TABLET | ORAL | 0 refills | Status: DC
  Filled 2021-11-28: qty 12, 28d supply, fill #0

## 2021-11-28 MED ORDER — MIDAZOLAM HCL 2 MG/2ML IJ SOLN
INTRAMUSCULAR | Status: AC
  Filled 2021-11-28: qty 4

## 2021-11-28 MED ORDER — POTASSIUM CHLORIDE CRYS ER 20 MEQ PO TBCR
40.0000 meq | EXTENDED_RELEASE_TABLET | Freq: Once | ORAL | Status: DC
  Filled 2021-11-28: qty 2

## 2021-11-28 MED ORDER — ACD FORMULA A 0.73-2.45-2.2 GM/100ML VI SOLN: 1000.0000 mL | Status: DC

## 2021-11-28 MED ORDER — POTASSIUM CHLORIDE CRYS ER 20 MEQ PO TBCR
20.0000 meq | EXTENDED_RELEASE_TABLET | Freq: Two times a day (BID) | ORAL | 0 refills | Status: DC
  Filled 2021-11-28: qty 30, 15d supply, fill #0

## 2021-11-28 MED ORDER — LIDOCAINE-EPINEPHRINE 1 %-1:100000 IJ SOLN
INTRAMUSCULAR | Status: AC | PRN
  Administered 2021-11-28: 10 mL

## 2021-11-28 MED ORDER — FENTANYL CITRATE (PF) 100 MCG/2ML IJ SOLN
INTRAMUSCULAR | Status: AC | PRN
  Administered 2021-11-28: 50 ug via INTRAVENOUS
  Administered 2021-11-28: 25 ug via INTRAVENOUS

## 2021-11-28 MED ORDER — GLIPIZIDE ER 5 MG PO TB24
5.0000 mg | ORAL_TABLET | Freq: Every day | ORAL | 0 refills | Status: DC
  Filled 2021-11-28: qty 30, 30d supply, fill #0

## 2021-11-28 MED ORDER — ACETAMINOPHEN 325 MG PO TABS: 650.0000 mg | ORAL_TABLET | ORAL | Status: DC | PRN

## 2021-11-28 MED ORDER — CEFAZOLIN SODIUM-DEXTROSE 2-4 GM/100ML-% IV SOLN
INTRAVENOUS | Status: AC
  Filled 2021-11-28: qty 100

## 2021-11-28 MED ORDER — ANTICOAGULANT SODIUM CITRATE 4% (200MG/5ML) IV SOLN
5.0000 mL | Freq: Once | Status: AC
  Administered 2021-11-28: 5 mL
  Filled 2021-11-28 (×2): qty 5

## 2021-11-28 NOTE — Procedures (Signed)
Interventional Radiology Procedure Note  Date of Procedure: 11/28/2021  Procedure: HD line conversion to tunneled line   Findings:  1. Successful conversion of right IJ temp HD line to tunneled HD line    Complications: No immediate complications noted.   Estimated Blood Loss: minimal  Follow-up and Recommendations: 1. Ready for use    Albin Felling, MD  Vascular & Interventional Radiology  11/28/2021 4:39 PM

## 2021-11-28 NOTE — Progress Notes (Addendum)
HEMATOLOGY-ONCOLOGY PROGRESS NOTE  ASSESSMENT AND PLAN: 1.  TTP initially diagnosed in October 2018 with recurrence in April 2020 and July 2023. Initiation of daily plasma exchange and Solu-Medrol 02/02/2017 [name] Antibody- 72, activity less than 2% on initial presentation; [name] from 08/15/2018 <2.0. Last plasma exchange 02/06/2017 Prednisone 60 mg daily beginning 02/08/2017 Prednisone 40 mg daily beginning 02/17/2017 Prednisone 30 mg daily beginning 03/09/2017 Prednisone 20 mg daily beginning 03/23/2017 Prednisone 10 mg daily beginning 04/01/2017 Prednisone 5 mg daily for 5 days then 5 mg every other day for 5 doses then stop beginning 05/11/2017 Recurrent TTP 08/14/2018 Plasma exchange/steroids started on 08/15/2018.  Plasma exchange discontinued after treatment on 08/19/2019, discharged on prednisone '60mg'$  daily Prednisone taper to 40 mg daily 08/22/2018 Weekly rituximab for 4 doses beginning 08/25/2018 Plasma exchange resumed 08/26/2018, 08/27/2018, 08/28/2018, 08/29/2018, 08/31/2018, 09/02/2018 Prednisone taper to 30 mg daily 09/01/2018 Cycle 3 weekly Rituxan 09/08/2018 Prednisone taper to 20 mg daily 09/08/2018 Cycle 4 weekly Rituxan 09/15/2018 Prednisone taper to 10 mg daily 09/20/2018 Prednisone taper to 5 mg daily 09/27/2018 Prednisone discontinued 10/07/2018 Admission with relapse of TTP 10/23/2021 Daily Plasma exchange initiated 11/23/2021 - prednisone 8/1 2. Xiphoid pain-etiology unclear 3. Hypertension 4. Diabetes 5. Alcohol/Tobacco use 6. History of renal insufficiency 7. Right IJ dialysis catheter placed 02/02/2017, removed New right IJ dialysis catheter 08/15/2018 Catheter removed 09/12/2018 New right IJ dialysis catheter placed 11/23/2021  [name] completed day 6 of plasmapheresis today.  He has been tolerating it well.  Platelet count has improved significantly and his LDH and bilirubin have normalized.  Hemoglobin is stable today.  He will be going to IR for placement of permanent  pheresis catheter.  Recommendations: 1.  Plan to continue plasmapheresis on 8/5 and then transition to outpatient plasmapheresis next week on Monday, Wednesday, Friday.  Hemodialysis unit aware. 2.  Monitor CBC, CMP, LDH daily. 3.  Reduce prednisone to 60 mg p.o. daily.  We will continue to taper as an outpatient. 4.  Monitor CBG while on prednisone 5.  Continue folic acid 6.  Outpatient follow-up will be scheduled at the Cancer center for 12/04/2021 with the plan to initiate weekly rituximab 7.  I anticipate discharge to home following plasmapheresis on 11/29/2021, will plan for outpatient plasmapheresis on Monday, Wednesday, and Friday for 1-2 weeks 8.  Please call Oncology as needed on 11/29/2021  Eric Hooper  Mr. Schubach was interviewed and examined.  He appears stable.  He continues to tolerate the plasmapheresis well.  The platelet count has further improved and the hemoglobin is stable.  Markers of hemolysis continue to improve.  I recommend completing plasma exchange today and again tomorrow.  He can then be discharged home with a plan to complete plasma exchange on Monday, Wednesday, and Friday next week.  He will begin rituximab therapy at the Cancer center on 12/04/2021.  SUBJECTIVE: Just completed plasmapheresis at the time my visit.  Overall he feels well.  No bleeding reported.  PHYSICAL EXAMINATION:  Vitals:   11/28/21 1325 11/28/21 1331  BP: (!) 136/101 (!) 131/93  Pulse: 80 80  Resp: (!) 25 (!) 25  Temp: 97.6 F (36.4 C) 97.9 F (36.6 C)  SpO2: 100% 100%   Filed Weights   11/26/21 0500 11/27/21 0500 11/28/21 0500  Weight: 88.6 kg 89.6 kg 77.1 kg    Intake/Output from previous day: No intake/output data recorded.  Physical Exam: HEENT: No thrush, mild scleral icterus Lungs: clear bilaterally CV: RRR ABD: No HSM, nontender Vascular: No leg edema  LABORATORY DATA:  I have reviewed the data as listed    Latest Ref Rng & Units 11/28/2021    3:07 AM 11/27/2021     3:03 AM 11/26/2021    4:00 AM  CMP  Glucose 70 - 99 mg/dL 97  126  189   BUN 6 - 20 mg/dL '15  17  16   '$ Creatinine 0.61 - 1.24 mg/dL 0.99  1.06  1.00   Sodium 135 - 145 mmol/L 140  140  139   Potassium 3.5 - 5.1 mmol/L 3.3  3.3  3.3   Chloride 98 - 111 mmol/L 106  105  108   CO2 22 - 32 mmol/L '27  28  27   '$ Calcium 8.9 - 10.3 mg/dL 8.1  8.0  9.1   Total Protein 6.5 - 8.1 g/dL 5.2  5.2  5.6   Total Bilirubin 0.3 - 1.2 mg/dL 0.8  1.3  2.2   Alkaline Phos 38 - 126 U/L 49  42  49   AST 15 - 41 U/L '21  28  26   '$ ALT 0 - 44 U/L 34  39  35     Lab Results  Component Value Date   WBC 17.0 (H) 11/28/2021   HGB 8.0 (L) 11/28/2021   HCT 25.0 (L) 11/28/2021   MCV 102.0 (H) 11/28/2021   PLT 131 (L) 11/28/2021   NEUTROABS 9.7 (H) 11/28/2021   Peripheral blood smear 10/23/2021: The platelets are markedly decreased, rare large platelet.  No platelet clumps.  The white cell morphology is unremarkable.  The polychromasia is increased.  Numerous schistocytes.  IR Fluoro Guide CV Line Right  Result Date: 11/23/2021 INDICATION: TTP requiring plasmapheresis EXAM: Temporary dialysis catheter placement MEDICATIONS: None ANESTHESIA/SEDATION: None FLUOROSCOPY: Radiation Exposure Index (as provided by the fluoroscopic device): 1 mGy Kerma COMPLICATIONS: None immediate. PROCEDURE: Informed written consent was obtained from the patient after a thorough discussion of the procedural risks, benefits and alternatives. All questions were addressed. Maximal Sterile Barrier Technique was utilized including caps, mask, sterile gowns, sterile gloves, sterile drape, hand hygiene and skin antiseptic. A timeout was performed prior to the initiation of the procedure. Right neck prepped and draped in the usual sterile fashion. All elements of maximal sterile barrier were utilized including, cap, mask, sterile gown, sterile gloves, large sterile drape, hand scrubbing and 2% Chlorhexidine for skin cleaning. The right internal jugular  vein was evaluated with ultrasound and shown to be patent. A permanent ultrasound image was obtained and placed in the patient's medical record. Using sterile gel and a sterile probe cover, the right internal jugular vein was entered with a 21 ga needle during real time ultrasound guidance. 0.018 inch guidewire placed and 21 ga needle exchanged for transitional dilator set. Utilizing fluoroscopy, 0.035 inch guidewire advanced through the transitional dilator the level of the IVC without difficulty. Serial dilation performed, and catheter inserted over the guidewire. The tip was positioned in the right atrium. All lumens of the catheter aspirated and flushed well. The dialysis lumens were locked with Heparin. The catheter was secured to the skin with suture. The insertion site was covered with sterile dressing. IMPRESSION: 15 cm temporary right IJ Trialysis catheter is ready for use. Electronically Signed   By: Miachel Roux M.D.   On: 11/23/2021 09:51   IR US Guide Vasc Access Right  Result Date: 11/23/2021 INDICATION: TTP requiring plasmapheresis EXAM: Temporary dialysis catheter placement MEDICATIONS: None ANESTHESIA/SEDATION: None FLUOROSCOPY: Radiation Exposure Index (as provided by the fluoroscopic  device): 1 mGy Kerma COMPLICATIONS: None immediate. PROCEDURE: Informed written consent was obtained from the patient after a thorough discussion of the procedural risks, benefits and alternatives. All questions were addressed. Maximal Sterile Barrier Technique was utilized including caps, mask, sterile gowns, sterile gloves, sterile drape, hand hygiene and skin antiseptic. A timeout was performed prior to the initiation of the procedure. Right neck prepped and draped in the usual sterile fashion. All elements of maximal sterile barrier were utilized including, cap, mask, sterile gown, sterile gloves, large sterile drape, hand scrubbing and 2% Chlorhexidine for skin cleaning. The right internal jugular vein was  evaluated with ultrasound and shown to be patent. A permanent ultrasound image was obtained and placed in the patient's medical record. Using sterile gel and a sterile probe cover, the right internal jugular vein was entered with a 21 ga needle during real time ultrasound guidance. 0.018 inch guidewire placed and 21 ga needle exchanged for transitional dilator set. Utilizing fluoroscopy, 0.035 inch guidewire advanced through the transitional dilator the level of the IVC without difficulty. Serial dilation performed, and catheter inserted over the guidewire. The tip was positioned in the right atrium. All lumens of the catheter aspirated and flushed well. The dialysis lumens were locked with Heparin. The catheter was secured to the skin with suture. The insertion site was covered with sterile dressing. IMPRESSION: 15 cm temporary right IJ Trialysis catheter is ready for use. Electronically Signed   By: Miachel Roux M.D.   On: 11/23/2021 09:51   CT Abdomen Pelvis W Contrast  Result Date: 11/22/2021 CLINICAL DATA:  Acute abdominal pain.  Hematuria. EXAM: CT ABDOMEN AND PELVIS WITH CONTRAST TECHNIQUE: Multidetector CT imaging of the abdomen and pelvis was performed using the standard protocol following bolus administration of intravenous contrast. RADIATION DOSE REDUCTION: This exam was performed according to the departmental dose-optimization program which includes automated exposure control, adjustment of the mA and/or kV according to patient size and/or use of iterative reconstruction technique. CONTRAST:  152m OMNIPAQUE IOHEXOL 300 MG/ML  SOLN COMPARISON:  Noncontrast CT on 12/21/2017 FINDINGS: Lower Chest: No acute findings. Hepatobiliary: No hepatic masses identified. Gallbladder is unremarkable. No evidence of biliary ductal dilatation. Pancreas:  No mass or inflammatory changes. Spleen: Within normal limits in size and appearance. Adrenals/Urinary Tract: No evidence of ureteral calculi or hydronephrosis.  Symmetric perinephric soft tissue stranding is seen bilaterally with extension inferiorly into pelvis. No renal masses or other parenchymal lesions are identified. Urinary bladder is unremarkable. Stomach/Bowel: No evidence of obstruction, inflammatory process or abnormal fluid collections. Vascular/Lymphatic: No pathologically enlarged lymph nodes. No acute vascular findings. Reproductive:  No mass or other significant abnormality. Other:  None. Musculoskeletal:  No suspicious bone lesions identified. IMPRESSION: Bilateral perinephric soft tissue stranding, with extension into pelvis. No evidence of hydronephrosis. This is nonspecific, and differential diagnosis includes bilateral pyelonephritis, vesicoureteral reflux, and medical renal disease. Recommend correlation with urinalysis. Electronically Signed   By: JMarlaine HindM.D.   On: 11/22/2021 18:56     Future Appointments  Date Time Provider DRich 12/04/2021  8:00 AM [facility] PHLEBOTOMIST [facility] None  12/04/2021  8:15 AM TOwens Shark NP [facility] None  12/04/2021  9:15 AM [facility] CHAIR 3 [facility] None  12/11/2021  9:30 AM [facility] PHLEBOTOMIST [facility] None  12/11/2021 10:30 AM [facility] CHAIR 1 [facility] None  12/18/2021  9:30 AM [facility] PHLEBOTOMIST [facility] None  12/18/2021 10:30 AM [facility] CHAIR 1 [facility] None  12/25/2021  9:30 AM [facility] PHLEBOTOMIST [facility] None  12/25/2021 10:30  AM [facility] CHAIR 3 [facility] None      LOS: 5 days

## 2021-11-28 NOTE — Progress Notes (Addendum)
     Daily Progress Note Intern Pager: [phone]  Patient name: Eric Hooper Medical record number: [id number] Date of birth: [birth year] Age: 46 y.o. Gender: male  Primary Care Provider: Rise Patience, DO Consultants: Nephrology, IR, heme-onc Code Status: Full  Pt Overview and Major Events to Date:  7/29-admitted 7/30-IR placed catheter, first plasmapheresis session  Assessment and Plan: Eric Hooper is a 46 year old male with past medical history of TTP who presented with hematuria in setting of TTP. Pertinent PMH/PSH includes TTP, HTN, and T2DM.   * TTP (thrombotic thrombocytopenic purpura) (HCC) Platelet and Hgb improved to 131 and 8 respectively. LDH has improved to stable levels at 187. IR to place tunnel Catheter today for outpatient PLEX. - Heme-onc following, appreciate recommendations - Daily plasmapheresis w/ FFP replacement (day 6 of 7) -Decreased prednisone to '60mg'$  daily - Continue protonix '20mg'$  daily  -Continue daily folic acid, per Heme - Monitor LDH levels daily  - Start Bactrium '160mg'$  3x/weekly  - Avoid NSAID use - SCDs -Consider starting rituximab outpatient  Type 2 diabetes mellitus without complications (HCC) CBG this morning was 97 > 83.  A1c is 5.8. NPO today for Tunnel Catheter placement. D/c meal time novolog given start of glipizide and low CBG this morning -Increased to moderate sliding scale. -Continue glipizide 5 mg daily -Continue home metformin 1000 mg twice daily - Monitor CBGs  Hypertension Stable. home medication include amlodipine '5mg'$ , Losartan '50mg'$  - continue home meds   Hypokalemia K 3.3 this AM - 80 mg K repletion  - BMP daily    FEN/GI: Regular PPx: SCDs Dispo:Home pending clinical improvement . Barriers include plasmapheresis treatment.   Subjective:  Patient reports he's doing well. Still endorses mild intermittent dizziness.  Objective: Temp:  [97.8 F (36.6 C)-98.8 F (37.1 C)] 98.1 F (36.7 C) (08/04 0933) Pulse  Rate:  [71-90] 71 (08/04 0933) Resp:  [16-22] 17 (08/04 0933) BP: (115-130)/(67-88) 128/79 (08/04 0933) SpO2:  [99 %-100 %] 100 % (08/04 0933) Weight:  [77.1 kg] 77.1 kg (08/04 0500) General: Alert, well appearing, NAD HEENT: Atraumatic, MMM, No sclera icterus CV: RRR, no murmurs, normal S1/S2 Pulm: CTAB, good WOB on RA, no crackles or wheezing Abd: Soft, no distension, no tenderness Skin: dry, warm Ext: No BLE edema   Laboratory: Most recent CBC Lab Results  Component Value Date   WBC 17.0 (H) 11/28/2021   HGB 8.0 (L) 11/28/2021   HCT 25.0 (L) 11/28/2021   MCV 102.0 (H) 11/28/2021   PLT 131 (L) 11/28/2021   Most recent BMP    Latest Ref Rng & Units 11/28/2021    3:07 AM  BMP  Glucose 70 - 99 mg/dL 97   BUN 6 - 20 mg/dL 15   Creatinine 0.61 - 1.24 mg/dL 0.99   Sodium 135 - 145 mmol/L 140   Potassium 3.5 - 5.1 mmol/L 3.3   Chloride 98 - 111 mmol/L 106   CO2 22 - 32 mmol/L 27   Calcium 8.9 - 10.3 mg/dL 8.1     Imaging/Diagnostic Tests: No new images  Alen Bleacher, MD 11/28/2021, 10:17 AM  PGY-2, [facility] Intern pager: [phone], text pages welcome Secure chat group [facility] Teaching Service

## 2021-11-28 NOTE — Consult Note (Signed)
Chief Complaint: Thrombotic thrombocytopenic purpura  Referring Physician(s): [name]  Supervising Physician: Juliet Rude  Patient Status: [facility] - In-pt  History of Present Illness: Eric Hooper is a 46 y.o. male who presented to the ED on 11/23/21 with hematuria secondary to TTP.  Platelet count on admission was 6. It has improved to 131.  We are asked to place a tunneled pheresis catheter today.  He is known to our service for previous catheter placements.  WBC today is 17 however patient is on prednisone. He is afebrile with no clinical evidence of active infection.  He is NPO.   Past Medical History:  Diagnosis Date   Controlled diabetes mellitus type 2 with complications ([place]) 13/0/8657   Diabetes mellitus without complication ([place])    non-insulin dependent   Diverticulosis 02/01/2017   on CT scan abd/pelvis   GERD (gastroesophageal reflux disease)    Hypertension    Kidney stones 02/03/2017   Mixed hyperlipidemia 02/28/2018   Smoker 04/13/2017    Past Surgical History:  Procedure Laterality Date   ELBOW SURGERY Right    IR FLUORO GUIDE CV LINE RIGHT  02/02/2017   IR FLUORO GUIDE CV LINE RIGHT  11/23/2021   IR US GUIDE VASC ACCESS RIGHT  02/02/2017   IR US GUIDE VASC ACCESS RIGHT  11/23/2021    Allergies: Ibuprofen  Medications: Prior to Admission medications   Medication Sig Start Date End Date Taking? Authorizing Provider  amLODipine (NORVASC) 5 MG tablet TAKE 1 TABLET BY MOUTH EVERYDAY AT BEDTIME Patient taking differently: Take 5 mg by mouth daily. 11/10/21  Yes Lilland, Alana, DO  atorvastatin (LIPITOR) 40 MG tablet TAKE 1 TABLET BY MOUTH EVERY DAY 08/06/21  Yes Brimage, Vondra, DO  esomeprazole (NEXIUM) 40 MG capsule Take 1 capsule (40 mg total) by mouth 2 (two) times daily before a meal. Patient taking differently: Take 40 mg by mouth 2 (two) times daily as needed. 07/11/18  Yes Bonnita Hollow, MD  losartan (COZAAR) 50 MG tablet TAKE 1  TABLET BY MOUTH EVERY DAY 08/06/21  Yes Brimage, Vondra, DO  metFORMIN (GLUCOPHAGE) 1000 MG tablet TAKE 1 TABLET TWICE A DAY Patient taking differently: Take 1,000 mg by mouth 2 (two) times daily with a meal. 08/26/20  Yes Brimage, Vondra, DO  naproxen sodium (ALEVE) 220 MG tablet Take 440 mg by mouth daily.   Yes [provider]  blood glucose meter kit and supplies KIT Dispense based on patient and insurance preference. Use up to four times daily as directed. (FOR ICD-9 250.00, 250.01). 08/25/18   Bonnita Hollow, MD  blood glucose meter kit and supplies Dispense based on patient and insurance preference. Use up to four times daily as directed. (FOR ICD-10 E10.9, E11.9). 09/02/18   Bonnita Hollow, MD  Fluticasone Propionate Truett Perna) 93 MCG/ACT EXHU Place 2 sprays into the nose daily. Patient not taking: Reported on 10/10/2020 03/05/20   [name], MD     Family History  Problem Relation Age of Onset   Hypertension Mother    Hyperlipidemia Mother    Diabetes type II Father    Colon cancer Maternal Aunt     Social History   Socioeconomic History   Marital status: Single    Spouse name: Not on file   Number of children: 2   Years of education: Not on file   Highest education level: Not on file  Occupational History   Occupation: Glass blower/designer  Tobacco Use   Smoking  status: Every Day    Packs/day: 0.50    Years: 15.00    Total pack years: 7.50    Types: Cigarettes   Smokeless tobacco: Never  Vaping Use   Vaping Use: Never used  Substance and Sexual Activity   Alcohol use: Yes    Comment: 3-4 beers per day most days   Drug use: No   Sexual activity: Yes  Other Topics Concern   Not on file  Social History Narrative   ** Merged History Encounter **       Social Determinants of Health   Financial Resource Strain: Not on file  Food Insecurity: Not on file  Transportation Needs: Not on file  Physical Activity: Not on file  Stress: Not on file  Social  Connections: Not on file     Review of Systems: A 12 point ROS discussed and pertinent positives are indicated in the HPI above.  All other systems are negative.  Review of Systems  Constitutional: Negative.   HENT: Negative.    Eyes: Negative.   Respiratory: Negative.    Cardiovascular: Negative.   Gastrointestinal: Negative.   Endocrine: Negative.   Genitourinary:  Positive for hematuria.  Musculoskeletal: Negative.   Skin: Negative.   Allergic/Immunologic: Negative.   Neurological:  Positive for headaches.  Hematological: Negative.   Psychiatric/Behavioral: Negative.      Vital Signs: BP 130/88 (BP Location: Left Arm)   Pulse 90   Temp 98 F (36.7 C) (Oral)   Resp 18   Ht '5\' 10"'  (1.778 m)   Wt 169 lb 15.6 oz (77.1 kg)   SpO2 100%   BMI 24.39 kg/m   Physical Exam Vitals reviewed.  Constitutional:      Appearance: Normal appearance.  HENT:     Head: Normocephalic and atraumatic.  Eyes:     Extraocular Movements: Extraocular movements intact.  Cardiovascular:     Rate and Rhythm: Normal rate and regular rhythm.  Pulmonary:     Effort: Pulmonary effort is normal. No respiratory distress.     Breath sounds: Normal breath sounds.  Abdominal:     General: There is no distension.     Palpations: Abdomen is soft.     Tenderness: There is no abdominal tenderness.  Musculoskeletal:        General: Normal range of motion.     Cervical back: Normal range of motion.  Skin:    General: Skin is warm and dry.  Neurological:     General: No focal deficit present.     Mental Status: He is alert and oriented to person, place, and time.  Psychiatric:        Mood and Affect: Mood normal.        Behavior: Behavior normal.        Thought Content: Thought content normal.        Judgment: Judgment normal.     Imaging: IR Fluoro Guide CV Line Right  Result Date: 11/23/2021 INDICATION: TTP requiring plasmapheresis EXAM: Temporary dialysis catheter placement MEDICATIONS:  None ANESTHESIA/SEDATION: None FLUOROSCOPY: Radiation Exposure Index (as provided by the fluoroscopic device): 1 mGy Kerma COMPLICATIONS: None immediate. PROCEDURE: Informed written consent was obtained from the patient after a thorough discussion of the procedural risks, benefits and alternatives. All questions were addressed. Maximal Sterile Barrier Technique was utilized including caps, mask, sterile gowns, sterile gloves, sterile drape, hand hygiene and skin antiseptic. A timeout was performed prior to the initiation of the procedure. Right neck prepped and draped in  the usual sterile fashion. All elements of maximal sterile barrier were utilized including, cap, mask, sterile gown, sterile gloves, large sterile drape, hand scrubbing and 2% Chlorhexidine for skin cleaning. The right internal jugular vein was evaluated with ultrasound and shown to be patent. A permanent ultrasound image was obtained and placed in the patient's medical record. Using sterile gel and a sterile probe cover, the right internal jugular vein was entered with a 21 ga needle during real time ultrasound guidance. 0.018 inch guidewire placed and 21 ga needle exchanged for transitional dilator set. Utilizing fluoroscopy, 0.035 inch guidewire advanced through the transitional dilator the level of the IVC without difficulty. Serial dilation performed, and catheter inserted over the guidewire. The tip was positioned in the right atrium. All lumens of the catheter aspirated and flushed well. The dialysis lumens were locked with Heparin. The catheter was secured to the skin with suture. The insertion site was covered with sterile dressing. IMPRESSION: 15 cm temporary right IJ Trialysis catheter is ready for use. Electronically Signed   By: Miachel Roux M.D.   On: 11/23/2021 09:51   IR US Guide Vasc Access Right  Result Date: 11/23/2021 INDICATION: TTP requiring plasmapheresis EXAM: Temporary dialysis catheter placement MEDICATIONS: None  ANESTHESIA/SEDATION: None FLUOROSCOPY: Radiation Exposure Index (as provided by the fluoroscopic device): 1 mGy Kerma COMPLICATIONS: None immediate. PROCEDURE: Informed written consent was obtained from the patient after a thorough discussion of the procedural risks, benefits and alternatives. All questions were addressed. Maximal Sterile Barrier Technique was utilized including caps, mask, sterile gowns, sterile gloves, sterile drape, hand hygiene and skin antiseptic. A timeout was performed prior to the initiation of the procedure. Right neck prepped and draped in the usual sterile fashion. All elements of maximal sterile barrier were utilized including, cap, mask, sterile gown, sterile gloves, large sterile drape, hand scrubbing and 2% Chlorhexidine for skin cleaning. The right internal jugular vein was evaluated with ultrasound and shown to be patent. A permanent ultrasound image was obtained and placed in the patient's medical record. Using sterile gel and a sterile probe cover, the right internal jugular vein was entered with a 21 ga needle during real time ultrasound guidance. 0.018 inch guidewire placed and 21 ga needle exchanged for transitional dilator set. Utilizing fluoroscopy, 0.035 inch guidewire advanced through the transitional dilator the level of the IVC without difficulty. Serial dilation performed, and catheter inserted over the guidewire. The tip was positioned in the right atrium. All lumens of the catheter aspirated and flushed well. The dialysis lumens were locked with Heparin. The catheter was secured to the skin with suture. The insertion site was covered with sterile dressing. IMPRESSION: 15 cm temporary right IJ Trialysis catheter is ready for use. Electronically Signed   By: Miachel Roux M.D.   On: 11/23/2021 09:51   CT Abdomen Pelvis W Contrast  Result Date: 11/22/2021 CLINICAL DATA:  Acute abdominal pain.  Hematuria. EXAM: CT ABDOMEN AND PELVIS WITH CONTRAST TECHNIQUE:  Multidetector CT imaging of the abdomen and pelvis was performed using the standard protocol following bolus administration of intravenous contrast. RADIATION DOSE REDUCTION: This exam was performed according to the departmental dose-optimization program which includes automated exposure control, adjustment of the mA and/or kV according to patient size and/or use of iterative reconstruction technique. CONTRAST:  151m OMNIPAQUE IOHEXOL 300 MG/ML  SOLN COMPARISON:  Noncontrast CT on 12/21/2017 FINDINGS: Lower Chest: No acute findings. Hepatobiliary: No hepatic masses identified. Gallbladder is unremarkable. No evidence of biliary ductal dilatation. Pancreas:  No mass  or inflammatory changes. Spleen: Within normal limits in size and appearance. Adrenals/Urinary Tract: No evidence of ureteral calculi or hydronephrosis. Symmetric perinephric soft tissue stranding is seen bilaterally with extension inferiorly into pelvis. No renal masses or other parenchymal lesions are identified. Urinary bladder is unremarkable. Stomach/Bowel: No evidence of obstruction, inflammatory process or abnormal fluid collections. Vascular/Lymphatic: No pathologically enlarged lymph nodes. No acute vascular findings. Reproductive:  No mass or other significant abnormality. Other:  None. Musculoskeletal:  No suspicious bone lesions identified. IMPRESSION: Bilateral perinephric soft tissue stranding, with extension into pelvis. No evidence of hydronephrosis. This is nonspecific, and differential diagnosis includes bilateral pyelonephritis, vesicoureteral reflux, and medical renal disease. Recommend correlation with urinalysis. Electronically Signed   By: Marlaine Hind M.D.   On: 11/22/2021 18:56    Labs:  CBC: Recent Labs    11/25/21 0325 11/25/21 1204 11/26/21 0400 11/27/21 0303 11/28/21 0307  WBC 12.3*  --  16.8* 16.9* 17.0*  HGB 8.2* 8.5* 7.7* 7.8* 8.0*  HCT 23.8* 25.0* 22.6* 23.6* 25.0*  PLT 16*  --  36* 76* 131*     COAGS: No results for input(s): "INR", "APTT" in the last 8760 hours.  BMP: Recent Labs    11/25/21 0325 11/25/21 1204 11/26/21 0400 11/27/21 0303 11/28/21 0307  NA 142 141 139 140 140  K 3.3* 4.0 3.3* 3.3* 3.3*  CL 107 100 108 105 106  CO2 30  --  '27 28 27  ' GLUCOSE 137* 269* 189* 126* 97  BUN '19 19 16 17 15  ' CALCIUM 8.6*  --  9.1 8.0* 8.1*  CREATININE 1.00 1.00 1.00 1.06 0.99  GFRNONAA >60  --  >60 >60 >60    LIVER FUNCTION TESTS: Recent Labs    11/25/21 0325 11/26/21 0400 11/27/21 0303 11/28/21 0307  BILITOT 4.4* 2.2* 1.3* 0.8  AST 36 '26 28 21  ' ALT 30 35 39 34  ALKPHOS 45 49 42 49  PROT 5.3* 5.6* 5.2* 5.2*  ALBUMIN 3.2* 3.2* 3.2* 3.2*    TUMOR MARKERS: No results for input(s): "AFPTM", "CEA", "CA199", "CHROMGRNA" in the last 8760 hours.  Assessment and Plan:  Thrombotic thrombocytopenic purpura - need for tunneled pheresis catheter placement.  Will proceed today by Dr. Denna Haggard  Risks and benefits discussed with the patient including, but not limited to bleeding, infection, vascular injury, pneumothorax which may require chest tube placement, air embolism or even death  All of the patient's questions were answered, patient is agreeable to proceed. Consent signed and in chart.  Thank you for allowing our service to participate in Johne Strehle 's care.  Electronically Signed: Pasty Spillers, PA-C 11/28/2021, 9:05 AM    I spent a total of    15 Minutes in face to face in clinical consultation, greater than 50% of which was counseling/coordinating care for tunneled catheter placement

## 2021-11-28 NOTE — Progress Notes (Addendum)
[facility] Progress Note   46 y.o. year-old w/ hx of DM2, TTP, HTN, HL, smoker who presented to ED w/ new onset gross hematuria. Pt has a hx of TTP and was treated here back in 2019 and in 2020 under care of Dr. Benay Spice. Labs here showed plt ct of 6, Hb 13.5, LDH 1687 and total bilirubin 12.7 (indirect 11.5). Pt seen by Dr. Lindi Adie of [facility] who has ordered plasamapheresis w/ FFP x 7 days.     Assessment/ Plan:   Acute TTP - recurrent, per Dr Lindi Adie needs plasmapheresis as above. Tolerated 1st TPE Sun and  #2 Mon; plan is for total of 7 treatments. Monitor platelet levels and LDH. RIJ temp 7/30. He had #4/7 today Plan on #6/7 TPE with FFP replacement; counts rising nicely. Will get him done in next hour to ensure he receives a treatment. He's also going for an exchange to a tunneled catheter by [facility].  7/7 TPE on Sat.  Will Klorcon 40 x2 doses today  DM2 - per pmd HTN - per pmd  Subjective:   Urine clear now; denies dysuria, fever, chills, h/a, sob. Some dizziness from Prednisone.   Objective:   BP 130/88 (BP Location: Left Arm)   Pulse 90   Temp 98 F (36.7 C) (Oral)   Resp 18   Ht '5\' 10"'$  (1.778 m)   Wt 77.1 kg   SpO2 100%   BMI 24.39 kg/m  No intake or output data in the 24 hours ending 11/28/21 0830  Weight change: -12.5 kg  Physical Exam: Gen alert, no distress No rash, cyanosis or gangrene No jvd or bruits Chest clear bilat to bases RRR no MRG Abd soft ntnd no mass or ascites +bs Ext no LE or UE edema, no wounds or ulcers Neuro is alert, Ox 3 , nf Access: RIJ temp  Imaging: No results found.  Labs: BMET Recent Labs  Lab 11/23/21 0336 11/24/21 0341 11/24/21 1303 11/25/21 0325 11/25/21 1204 11/26/21 0400 11/27/21 0303 11/28/21 0307  NA 136 141 141 142 141 139 140 140  K 3.9 3.3* 3.4* 3.3* 4.0 3.3* 3.3* 3.3*  CL 105 104 104 107 100 108 105 106  CO2 '22 30 29 30  '$ --  '27 28 27  '$ GLUCOSE 254* 160* 194* 137* 269* 189* 126* 97  BUN 30* 36*  29* '19 19 16 17 15  '$ CREATININE 1.12 1.28* 1.13 1.00 1.00 1.00 1.06 0.99  CALCIUM 9.5 8.8* 8.7* 8.6*  --  9.1 8.0* 8.1*   CBC Recent Labs  Lab 11/24/21 0341 11/25/21 0325 11/25/21 1204 11/26/21 0400 11/27/21 0303 11/28/21 0307  WBC 14.5* 12.3*  --  16.8* 16.9* 17.0*  NEUTROABS 6.1  --   --  9.4* 10.5* 9.7*  HGB 9.9* 8.2* 8.5* 7.7* 7.8* 8.0*  HCT 27.9* 23.8* 25.0* 22.6* 23.6* 25.0*  MCV 91.2 94.8  --  96.2 98.7 102.0*  PLT 6* 16*  --  36* 76* 131*    Medications:     amLODipine  5 mg Oral Daily   atorvastatin  40 mg Oral Daily   calcium carbonate  2 tablet Oral Q3H   Chlorhexidine Gluconate Cloth  6 each Topical Daily   esomeprazole  20 mg Oral Daily   folic acid  1 mg Oral Daily   glipiZIDE  5 mg Oral Q breakfast   insulin aspart  0-15 Units Subcutaneous TID WC   insulin aspart  3 Units Subcutaneous TID WC   losartan  50  mg Oral Daily   metFORMIN  1,000 mg Oral BID WC   [START ON 11/29/2021] pneumococcal 20-valent conjugate vaccine  0.5 mL Intramuscular Tomorrow-1000   potassium chloride  40 mEq Oral Once   potassium chloride  40 mEq Oral Once   [START ON 11/29/2021] predniSONE  60 mg Oral Q breakfast   sulfamethoxazole-trimethoprim  1 tablet Oral Once per day on Mon Wed Fri      [name], MD 11/28/2021, 8:30 AM

## 2021-11-29 LAB — MAGNESIUM: Magnesium: 1.8 mg/dL (ref 1.7–2.4)

## 2021-11-29 LAB — THERAPEUTIC PLASMA EXCHANGE (BLOOD BANK)
Plasma Exchange: 3417
Plasma volume needed: 3400
Unit division: 0
Unit division: 0
Unit division: 0
Unit division: 0
Unit division: 0
Unit division: 0
Unit division: 0
Unit division: 0
Unit division: 0
Unit division: 0
Unit division: 0
Unit division: 0

## 2021-11-29 LAB — COMPREHENSIVE METABOLIC PANEL
ALT: 31 U/L (ref 0–44)
AST: 22 U/L (ref 15–41)
Albumin: 3 g/dL — ABNORMAL LOW (ref 3.5–5.0)
Alkaline Phosphatase: 48 U/L (ref 38–126)
Anion gap: 6 (ref 5–15)
BUN: 16 mg/dL (ref 6–20)
CO2: 24 mmol/L (ref 22–32)
Calcium: 8.1 mg/dL — ABNORMAL LOW (ref 8.9–10.3)
Chloride: 109 mmol/L (ref 98–111)
Creatinine, Ser: 1.05 mg/dL (ref 0.61–1.24)
GFR, Estimated: >60 mL/min (ref >60)
Glucose, Bld: 132 mg/dL — ABNORMAL HIGH (ref 70–99)
Potassium: 3.9 mmol/L (ref 3.5–5.1)
Sodium: 139 mmol/L (ref 135–145)
Total Bilirubin: 0.7 mg/dL (ref 0.3–1.2)
Total Protein: 4.9 g/dL — ABNORMAL LOW (ref 6.5–8.1)

## 2021-11-29 LAB — CBC WITH DIFFERENTIAL/PLATELET
Abs Immature Granulocytes: 0.24 10*3/uL — ABNORMAL HIGH (ref 0.00–0.07)
Basophils Absolute: 0 10*3/uL (ref 0.0–0.1)
Basophils Relative: 0 %
Eosinophils Absolute: 0.2 10*3/uL (ref 0.0–0.5)
Eosinophils Relative: 1 %
HCT: 25.7 % — ABNORMAL LOW (ref 39.0–52.0)
Hemoglobin: 8.2 g/dL — ABNORMAL LOW (ref 13.0–17.0)
Immature Granulocytes: 2 %
Lymphocytes Relative: 37 %
Lymphs Abs: 5.8 10*3/uL — ABNORMAL HIGH (ref 0.7–4.0)
MCH: 32.7 pg (ref 26.0–34.0)
MCHC: 31.9 g/dL (ref 30.0–36.0)
MCV: 102.4 fL — ABNORMAL HIGH (ref 80.0–100.0)
Monocytes Absolute: 1.3 10*3/uL — ABNORMAL HIGH (ref 0.1–1.0)
Monocytes Relative: 8 %
Neutro Abs: 8.3 10*3/uL — ABNORMAL HIGH (ref 1.7–7.7)
Neutrophils Relative %: 52 %
Platelets: 173 10*3/uL (ref 150–400)
RBC: 2.51 MIL/uL — ABNORMAL LOW (ref 4.22–5.81)
RDW: 20.8 % — ABNORMAL HIGH (ref 11.5–15.5)
WBC: 15.8 10*3/uL — ABNORMAL HIGH (ref 4.0–10.5)
nRBC: 4.5 % — ABNORMAL HIGH (ref 0.0–0.2)

## 2021-11-29 LAB — GLUCOSE, CAPILLARY
Glucose-Capillary: 125 mg/dL — ABNORMAL HIGH (ref 70–99)
Glucose-Capillary: 222 mg/dL — ABNORMAL HIGH (ref 70–99)

## 2021-11-29 MED ORDER — ESOMEPRAZOLE MAGNESIUM 20 MG PO CPDR: 20.0000 mg | DELAYED_RELEASE_CAPSULE | Freq: Every day | ORAL | 0 refills | Status: DC

## 2021-11-29 MED ORDER — POTASSIUM CHLORIDE CRYS ER 20 MEQ PO TBCR
40.0000 meq | EXTENDED_RELEASE_TABLET | Freq: Once | ORAL | Status: AC
  Administered 2021-11-29: 40 meq via ORAL
  Filled 2021-11-29: qty 2

## 2021-11-29 MED ORDER — ANTICOAGULANT SODIUM CITRATE 4% (200MG/5ML) IV SOLN
5.0000 mL | Freq: Once | Status: AC
  Administered 2021-11-29: 5 mL
  Filled 2021-11-29: qty 5

## 2021-11-29 MED ORDER — MAGNESIUM SULFATE 2 GM/50ML IV SOLN
2.0000 g | Freq: Once | INTRAVENOUS | Status: AC
  Administered 2021-11-29: 2 g via INTRAVENOUS
  Filled 2021-11-29: qty 50

## 2021-11-29 MED ORDER — DIPHENHYDRAMINE HCL 25 MG PO CAPS
25.0000 mg | ORAL_CAPSULE | Freq: Four times a day (QID) | ORAL | Status: DC | PRN
  Administered 2021-11-29: 25 mg via ORAL
  Filled 2021-11-29: qty 1

## 2021-11-29 MED ORDER — CALCIUM GLUCONATE-NACL 2-0.675 GM/100ML-% IV SOLN
INTRAVENOUS | Status: AC
  Filled 2021-11-29: qty 100

## 2021-11-29 MED ORDER — ACD FORMULA A 0.73-2.45-2.2 GM/100ML VI SOLN
1000.0000 mL | Status: DC
  Filled 2021-11-29: qty 1000

## 2021-11-29 MED ORDER — ACETAMINOPHEN 325 MG PO TABS: 650.0000 mg | ORAL_TABLET | ORAL | 0 refills | Status: AC | PRN

## 2021-11-29 MED ORDER — CALCIUM GLUCONATE-NACL 2-0.675 GM/100ML-% IV SOLN
2.0000 g | Freq: Once | INTRAVENOUS | Status: AC
  Administered 2021-11-29: 2000 mg via INTRAVENOUS
  Filled 2021-11-29: qty 100

## 2021-11-29 MED ORDER — ACETAMINOPHEN 325 MG PO TABS
650.0000 mg | ORAL_TABLET | ORAL | Status: DC | PRN
  Administered 2021-11-29: 650 mg via ORAL
  Filled 2021-11-29: qty 2

## 2021-11-29 MED ORDER — ACD FORMULA A 0.73-2.45-2.2 GM/100ML VI SOLN
Status: AC
  Administered 2021-11-29: 1000 mL
  Filled 2021-11-29: qty 1000

## 2021-11-29 NOTE — Discharge Summary (Signed)
[facility] Discharge Summary  Patient name: Eric Hooper Medical record number: [id number] Date of birth: Aug 27, 1975 Age: 46 y.o. Gender: male Date of Admission: 11/22/2021  Date of Discharge: 11/29/2021 Admitting Physician: Shary Key, DO  Primary Care Provider: Rise Patience, DO Consultants: Nephrology, IR, heme-onc  Indication for Hospitalization: Thrombotic thrombocytopenic purpura  Brief Hospital Course:  Eric Hooper  is a 46 year old male with past medical history significant for type 2 diabetes, HTN, HLD, TTP episodes x2 who presented with hematuria and dizziness and was found to be in TTP.  Thrombotic thrombocytopenic purpura Patient presented with 1 day of hematuria and dizziness.  On admission patient was severely thrombocytopenic with elevated LDH.  He had been taking Aleve twice a day. Hematology was consulted and catheter was placed by IR for plasmapheresis.  Patient had plasmapheresis with supplemental FFP starting 11/23/2021. Plasmapheresis was continued for 7 days with last day on 8/5 . He was eventually started on folic acid and  80 mg prednisone daily. Prednisone was tapered to 60 mg at the time of discharge with plan for heme-onc to continue tapering outpatient. Tunnel catheter placed on 8/4 to continue plasmapheresis 2-3 times a week outpatient.  Hematuria Patient presented with hematuria and denies dysuria, frequency, urgency.  Urine culture was negative. Patient received 1 dose of Rocephin which was discontinued as patient was afebrile, asymptomatic.  Hematuria was resolved by discharge.  Hyperglycemia Patient with history of diabetes on metformin was managed for hyperglycemia during hospitalization. Given his recent initiation of steroid treatment he had subsequent hyperglycemic. His hyperglycemia was managed with home metformin and sliding scale insuline. By the time of discharge patient was started on Glipizide 5 mg daily she  should continue outpatient until he is tapered off steroids.   Items to Follow Up:  Outpatient plasmapheresis 2-3 times per week. Monitor LDH and platelet levels.  PCP to monitor CMP, if LFTs remain elevated after TTP resolves, obtain hepatic panel.  Started on 158m Bactrium  PCP prophylaxis if on 20 mg of steroids or more for 30 days. Steroid start date 8/1. Will need close monitoring of his potassium outpatient given steroid treatment. Started on potassium 20 mg daily until he completes his steroid treatment.   Discharge Diagnoses/Problem List:  Thrombotic thrombocytopenic purpura Hyperglycemia Hematuria   Disposition: Home  Discharge Condition: Stable  Discharge Exam:  General: Alert, well appearing, NAD HEENT: Atraumatic, MMM, No sclera icterus CV: RRR, no murmurs, normal S1/S2 Pulm: CTAB, good WOB on RA, no crackles or wheezing Abd: Soft, no distension, no tenderness Skin: dry, warm, mildly jaundiced Ext: No BLE edema, +2 Pedal and radial pulse.  Significant Procedures:  Plasmapheresis Tunnel Catheter placement (8/4)  Significant Labs and Imaging:  Recent Labs  Lab 11/28/21 0307 11/29/21 0341  WBC 17.0* 15.8*  HGB 8.0* 8.2*  HCT 25.0* 25.7*  PLT 131* 173   Recent Labs  Lab 11/28/21 0307 11/29/21 0341  NA 140 139  K 3.3* 3.9  CL 106 109  CO2 27 24  GLUCOSE 97 132*  BUN 15 16  CREATININE 0.99 1.05  CALCIUM 8.1* 8.1*  MG 2.1 1.8  ALKPHOS 49 48  AST 21 22  ALT 34 31  ALBUMIN 3.2* 3.0*   IR Fluoro Guide CV Line Right  Result Date: 11/29/2021 INDICATION: Need for long-term dialysis access EXAM: Conversion of temporary hemodialysis catheter to tunneled hemodialysis catheter using fluoroscopic guidance MEDICATIONS: Per EMR ANESTHESIA/SEDATION: Moderate (conscious) sedation was employed during this procedure. A total of  Versed 1.5 mg and Fentanyl 75 mcg was administered intravenously. Moderate Sedation Time: 23 minutes. The patient's level of consciousness  and vital signs were monitored continuously by radiology nursing throughout the procedure under my direct supervision. FLUOROSCOPY TIME:  Fluoroscopy Time: 0.5 minutes (2 mGy) COMPLICATIONS: None immediate. PROCEDURE: Informed written consent was obtained from the patient after a thorough discussion of the procedural risks, benefits and alternatives. All questions were addressed. Maximal Sterile Barrier Technique was utilized including caps, mask, sterile gowns, sterile gloves, sterile drape, hand hygiene and skin antiseptic. A timeout was performed prior to the initiation of the procedure. The patient was placed supine on the exam table. The right neck and chest was prepped and draped in the standard sterile fashion with inclusion of the existing temporary hemodialysis catheter within the sterile field. Over an 035 wire, the existing hemodialysis catheter was exchanged for a 14 [place] dilator. Attention was then turned to an infraclavicular location, where a small dermatotomy was made after the administration of additional local anesthetic. From this location, a 19 cm tip-to-cuff hemodialysis catheter was tunneled to the venotomy site. A peel-away sheath was then advanced over the access wire. Through this peel-away sheath, the hemodialysis catheter was advanced into the central veins, such that the tip was positioned near the superior cavoatrial junction. The line was found to flush and aspirate appropriately. It was sutured to the skin using 0 silk suture, and the venotomy was closed with 4-0 Vicryl and Dermabond. A sterile dressing was placed. The patient tolerated the procedure well without immediate complication. IMPRESSION: Successful exchange of right internal jugular temporary hemodialysis catheter for a new tunneled hemodialysis catheter. The catheter is ready for immediate use. Electronically Signed   By: Albin Felling M.D.   On: 11/29/2021 11:46   IR Fluoro Guide CV Line Right  Result Date:  11/23/2021 INDICATION: TTP requiring plasmapheresis EXAM: Temporary dialysis catheter placement MEDICATIONS: None ANESTHESIA/SEDATION: None FLUOROSCOPY: Radiation Exposure Index (as provided by the fluoroscopic device): 1 mGy Kerma COMPLICATIONS: None immediate. PROCEDURE: Informed written consent was obtained from the patient after a thorough discussion of the procedural risks, benefits and alternatives. All questions were addressed. Maximal Sterile Barrier Technique was utilized including caps, mask, sterile gowns, sterile gloves, sterile drape, hand hygiene and skin antiseptic. A timeout was performed prior to the initiation of the procedure. Right neck prepped and draped in the usual sterile fashion. All elements of maximal sterile barrier were utilized including, cap, mask, sterile gown, sterile gloves, large sterile drape, hand scrubbing and 2% Chlorhexidine for skin cleaning. The right internal jugular vein was evaluated with ultrasound and shown to be patent. A permanent ultrasound image was obtained and placed in the patient's medical record. Using sterile gel and a sterile probe cover, the right internal jugular vein was entered with a 21 ga needle during real time ultrasound guidance. 0.018 inch guidewire placed and 21 ga needle exchanged for transitional dilator set. Utilizing fluoroscopy, 0.035 inch guidewire advanced through the transitional dilator the level of the IVC without difficulty. Serial dilation performed, and catheter inserted over the guidewire. The tip was positioned in the right atrium. All lumens of the catheter aspirated and flushed well. The dialysis lumens were locked with Heparin. The catheter was secured to the skin with suture. The insertion site was covered with sterile dressing. IMPRESSION: 15 cm temporary right IJ Trialysis catheter is ready for use. Electronically Signed   By: Miachel Roux M.D.   On: 11/23/2021 09:51   IR US Guide  Vasc Access Right  Result Date:  11/23/2021 INDICATION: TTP requiring plasmapheresis EXAM: Temporary dialysis catheter placement MEDICATIONS: None ANESTHESIA/SEDATION: None FLUOROSCOPY: Radiation Exposure Index (as provided by the fluoroscopic device): 1 mGy Kerma COMPLICATIONS: None immediate. PROCEDURE: Informed written consent was obtained from the patient after a thorough discussion of the procedural risks, benefits and alternatives. All questions were addressed. Maximal Sterile Barrier Technique was utilized including caps, mask, sterile gowns, sterile gloves, sterile drape, hand hygiene and skin antiseptic. A timeout was performed prior to the initiation of the procedure. Right neck prepped and draped in the usual sterile fashion. All elements of maximal sterile barrier were utilized including, cap, mask, sterile gown, sterile gloves, large sterile drape, hand scrubbing and 2% Chlorhexidine for skin cleaning. The right internal jugular vein was evaluated with ultrasound and shown to be patent. A permanent ultrasound image was obtained and placed in the patient's medical record. Using sterile gel and a sterile probe cover, the right internal jugular vein was entered with a 21 ga needle during real time ultrasound guidance. 0.018 inch guidewire placed and 21 ga needle exchanged for transitional dilator set. Utilizing fluoroscopy, 0.035 inch guidewire advanced through the transitional dilator the level of the IVC without difficulty. Serial dilation performed, and catheter inserted over the guidewire. The tip was positioned in the right atrium. All lumens of the catheter aspirated and flushed well. The dialysis lumens were locked with Heparin. The catheter was secured to the skin with suture. The insertion site was covered with sterile dressing. IMPRESSION: 15 cm temporary right IJ Trialysis catheter is ready for use. Electronically Signed   By: Miachel Roux M.D.   On: 11/23/2021 09:51   CT Abdomen Pelvis W Contrast  Result Date:  11/22/2021 CLINICAL DATA:  Acute abdominal pain.  Hematuria. EXAM: CT ABDOMEN AND PELVIS WITH CONTRAST TECHNIQUE: Multidetector CT imaging of the abdomen and pelvis was performed using the standard protocol following bolus administration of intravenous contrast. RADIATION DOSE REDUCTION: This exam was performed according to the departmental dose-optimization program which includes automated exposure control, adjustment of the mA and/or kV according to patient size and/or use of iterative reconstruction technique. CONTRAST:  160m OMNIPAQUE IOHEXOL 300 MG/ML  SOLN COMPARISON:  Noncontrast CT on 12/21/2017 FINDINGS: Lower Chest: No acute findings. Hepatobiliary: No hepatic masses identified. Gallbladder is unremarkable. No evidence of biliary ductal dilatation. Pancreas:  No mass or inflammatory changes. Spleen: Within normal limits in size and appearance. Adrenals/Urinary Tract: No evidence of ureteral calculi or hydronephrosis. Symmetric perinephric soft tissue stranding is seen bilaterally with extension inferiorly into pelvis. No renal masses or other parenchymal lesions are identified. Urinary bladder is unremarkable. Stomach/Bowel: No evidence of obstruction, inflammatory process or abnormal fluid collections. Vascular/Lymphatic: No pathologically enlarged lymph nodes. No acute vascular findings. Reproductive:  No mass or other significant abnormality. Other:  None. Musculoskeletal:  No suspicious bone lesions identified. IMPRESSION: Bilateral perinephric soft tissue stranding, with extension into pelvis. No evidence of hydronephrosis. This is nonspecific, and differential diagnosis includes bilateral pyelonephritis, vesicoureteral reflux, and medical renal disease. Recommend correlation with urinalysis. Electronically Signed   By: JMarlaine HindM.D.   On: 11/22/2021 18:56      Results/Tests Pending at Time of Discharge: None  Discharge Medications:  Allergies as of 11/29/2021       Reactions   Ibuprofen  Other (See Comments)   Drops platelets/ thrombocytopenia        Medication List     STOP taking these medications    naproxen sodium  220 MG tablet Commonly known as: ALEVE   Xhance 93 MCG/ACT Exhu Generic drug: Fluticasone Propionate       TAKE these medications    acetaminophen 325 MG tablet Commonly known as: TYLENOL Take 2 tablets (650 mg total) by mouth every 4 (four) hours as needed (discomfort or fever).   amLODipine 5 MG tablet Commonly known as: NORVASC TAKE 1 TABLET BY MOUTH EVERYDAY AT BEDTIME What changed: See the new instructions.   atorvastatin 40 MG tablet Commonly known as: LIPITOR TAKE 1 TABLET BY MOUTH EVERY DAY   blood glucose meter kit and supplies Dispense based on patient and insurance preference. Use up to four times daily as directed. (FOR ICD-10 E10.9, E11.9).   blood glucose meter kit and supplies Kit Dispense based on patient and insurance preference. Use up to four times daily as directed. (FOR ICD-9 250.00, 250.01).   esomeprazole 20 MG capsule Commonly known as: NEXIUM Take 1 capsule (20 mg total) by mouth daily. Start taking on: November 30, 2021 What changed:  medication strength how much to take when to take this   folic acid 1 MG tablet Commonly known as: FOLVITE Take 1 tablet (1 mg total) by mouth daily.   glipiZIDE 5 MG 24 hr tablet Commonly known as: GLUCOTROL XL Take 1 tablet (5 mg total) by mouth daily with breakfast.   losartan 50 MG tablet Commonly known as: COZAAR TAKE 1 TABLET BY MOUTH EVERY DAY   metFORMIN 1000 MG tablet Commonly known as: GLUCOPHAGE TAKE 1 TABLET TWICE A DAY What changed: when to take this   potassium chloride SA 20 MEQ tablet Commonly known as: KLOR-CON M Take 1 tablet (20 mEq total) by mouth 2 (two) times daily.   predniSONE 20 MG tablet Commonly known as: DELTASONE Take 3 tablets (60 mg total) by mouth daily with breakfast.   sulfamethoxazole-trimethoprim 800-160 MG tablet Commonly  known as: BACTRIM DS Take 1 tablet by mouth 3 (three) times a week.        Discharge Instructions: Please refer to Patient Instructions section of EMR for full details.  Patient was counseled important signs and symptoms that should prompt return to medical care, changes in medications, dietary instructions, activity restrictions, and follow up appointments.     Alen Bleacher, MD 11/29/2021, 4:09 PM PGY-2, [facility]

## 2021-11-29 NOTE — Progress Notes (Signed)
Nsg Discharge Note  Admit Date:  11/22/2021 Discharge date: 11/29/2021   Eric Hooper to be D/C'd Home per MD order.  AVS completed. Patient/caregiver able to verbalize understanding.  Discharge Medication: Allergies as of 11/29/2021       Reactions   Ibuprofen Other (See Comments)   Drops platelets/ thrombocytopenia        Medication List     STOP taking these medications    naproxen sodium 220 MG tablet Commonly known as: ALEVE   Xhance 93 MCG/ACT Exhu Generic drug: Fluticasone Propionate       TAKE these medications    acetaminophen 325 MG tablet Commonly known as: TYLENOL Take 2 tablets (650 mg total) by mouth every 4 (four) hours as needed (discomfort or fever).   amLODipine 5 MG tablet Commonly known as: NORVASC TAKE 1 TABLET BY MOUTH EVERYDAY AT BEDTIME What changed: See the new instructions.   atorvastatin 40 MG tablet Commonly known as: LIPITOR TAKE 1 TABLET BY MOUTH EVERY DAY   blood glucose meter kit and supplies Dispense based on patient and insurance preference. Use up to four times daily as directed. (FOR ICD-10 E10.9, E11.9).   blood glucose meter kit and supplies Kit Dispense based on patient and insurance preference. Use up to four times daily as directed. (FOR ICD-9 250.00, 250.01).   esomeprazole 20 MG capsule Commonly known as: NEXIUM Take 1 capsule (20 mg total) by mouth daily. Start taking on: November 30, 2021 What changed:  medication strength how much to take when to take this   folic acid 1 MG tablet Commonly known as: FOLVITE Take 1 tablet (1 mg total) by mouth daily.   glipiZIDE 5 MG 24 hr tablet Commonly known as: GLUCOTROL XL Take 1 tablet (5 mg total) by mouth daily with breakfast.   losartan 50 MG tablet Commonly known as: COZAAR TAKE 1 TABLET BY MOUTH EVERY DAY   metFORMIN 1000 MG tablet Commonly known as: GLUCOPHAGE TAKE 1 TABLET TWICE A DAY What changed: when to take this   potassium chloride SA 20 MEQ  tablet Commonly known as: KLOR-CON M Take 1 tablet (20 mEq total) by mouth 2 (two) times daily.   predniSONE 20 MG tablet Commonly known as: DELTASONE Take 3 tablets (60 mg total) by mouth daily with breakfast.   sulfamethoxazole-trimethoprim 800-160 MG tablet Commonly known as: BACTRIM DS Take 1 tablet by mouth 3 (three) times a week.        Discharge Assessment: Vitals:   11/29/21 1423 11/29/21 1526  BP: 124/73 93/68  Pulse:  80  Resp: 16 18  Temp: 98.1 F (36.7 C) 98.1 F (36.7 C)  SpO2: 99% 100%   Skin clean, dry and intact without evidence of skin break down, no evidence of skin tears noted. IV catheter discontinued intact. Site without signs and symptoms of complications - no redness or edema noted at insertion site, patient denies c/o pain - only slight tenderness at site.  Dressing with slight pressure applied.  D/c Instructions-Education: Discharge instructions given to patient/family with verbalized understanding. D/c education completed with patient/family including follow up instructions, medication list, d/c activities limitations if indicated, with other d/c instructions as indicated by MD - patient able to verbalize understanding, all questions fully answered. Patient instructed to return to ED, call 911, or call MD for any changes in condition.  Patient escorted via [place], and D/C home via private auto.  Atilano Ina, RN 11/29/2021 4:26 PM

## 2021-11-29 NOTE — Progress Notes (Signed)
[facility] Progress Note   46 y.o. year-old w/ hx of DM2, TTP, HTN, HL, smoker who presented to ED w/ new onset gross hematuria. Pt has a hx of TTP and was treated here back in 2019 and in 2020 under care of Dr. Benay Spice. Labs here showed plt ct of 6, Hb 13.5, LDH 1687 and total bilirubin 12.7 (indirect 11.5). Pt seen by Dr. Lindi Adie of Hem-Onc who has ordered plasamapheresis w/ FFP x 7 days.     Assessment/ Plan:   Acute TTP - recurrent, per Dr Lindi Adie needs plasmapheresis as above. Tolerated 1st TPE Sun and  #2 Mon; plan is for total of 7 treatments. Monitor platelet levels and LDH. RIJ temp 7/30. He had #4/7 today Plan on #6/7 TPE with FFP replacement; counts rising nicely. Will get him done in next hour to ensure he receives a treatment. He's also going for an exchange to a tunneled catheter by VIR.  7/7 TPE on Sat.  Appreciate TC by VIR 8/4 (sore as would be expected but very minimal old bleeding; no active bleed); also appreciate nursing staff removing the temp cath.  Klorcon 40 x2 doses 8/4 and K much better  DM2 - per pmd HTN - per pmd  Subjective:   Urine clear now; denies dysuria, fever, chills, h/a, sob. Some dizziness from Prednisone.   Objective:   BP (!) 103/58 (BP Location: Right Arm)   Pulse 70   Temp 98.3 F (36.8 C) (Oral)   Resp 18   Ht '5\' 10"'$  (1.778 m)   Wt 77.1 kg   SpO2 97%   BMI 24.39 kg/m   Intake/Output Summary (Last 24 hours) at 11/29/2021 0744 Last data filed at 11/28/2021 2122 Gross per 24 hour  Intake 480 ml  Output 1300 ml  Net -820 ml    Weight change:   Physical Exam: Gen alert, no distress No rash, cyanosis or gangrene No jvd or bruits Chest clear bilat to bases RRR no MRG Abd soft ntnd no mass or ascites +bs Ext no LE or UE edema, no wounds or ulcers Neuro is alert, Ox 3 , nf Access: RIJ TC  Imaging: No results found.  Labs: BMET Recent Labs  Lab 11/24/21 0341 11/24/21 1303 11/25/21 0325 11/25/21 1204  11/26/21 0400 11/27/21 0303 11/28/21 0307 11/29/21 0341  NA 141 141 142 141 139 140 140 139  K 3.3* 3.4* 3.3* 4.0 3.3* 3.3* 3.3* 3.9  CL 104 104 107 100 108 105 106 109  CO2 '30 29 30  '$ --  '27 28 27 24  '$ GLUCOSE 160* 194* 137* 269* 189* 126* 97 132*  BUN 36* 29* '19 19 16 17 15 16  '$ CREATININE 1.28* 1.13 1.00 1.00 1.00 1.06 0.99 1.05  CALCIUM 8.8* 8.7* 8.6*  --  9.1 8.0* 8.1* 8.1*   CBC Recent Labs  Lab 11/26/21 0400 11/27/21 0303 11/28/21 0307 11/29/21 0341  WBC 16.8* 16.9* 17.0* 15.8*  NEUTROABS 9.4* 10.5* 9.7* 8.3*  HGB 7.7* 7.8* 8.0* 8.2*  HCT 22.6* 23.6* 25.0* 25.7*  MCV 96.2 98.7 102.0* 102.4*  PLT 36* 76* 131* 173    Medications:     amLODipine  5 mg Oral Daily   atorvastatin  40 mg Oral Daily   Chlorhexidine Gluconate Cloth  6 each Topical Daily   esomeprazole  20 mg Oral Daily   folic acid  1 mg Oral Daily   glipiZIDE  5 mg Oral Q breakfast   insulin aspart  0-15 Units Subcutaneous TID  WC   losartan  50 mg Oral Daily   metFORMIN  1,000 mg Oral BID WC   pneumococcal 20-valent conjugate vaccine  0.5 mL Intramuscular Tomorrow-1000   potassium chloride  40 mEq Oral Once   predniSONE  60 mg Oral Q breakfast   sulfamethoxazole-trimethoprim  1 tablet Oral Once per day on Mon Wed Fri      [name], MD 11/29/2021, 7:44 AM

## 2021-11-30 LAB — THERAPEUTIC PLASMA EXCHANGE (BLOOD BANK)
Plasma Exchange: 3733
Plasma volume needed: 3733
Unit division: 0
Unit division: 0
Unit division: 0
Unit division: 0
Unit division: 0
Unit division: 0
Unit division: 0
Unit division: 0
Unit division: 0
Unit division: 0
Unit division: 0

## 2021-12-01 ENCOUNTER — Non-Acute Institutional Stay (HOSPITAL_COMMUNITY)
Admission: RE | Admit: 2021-12-01 | Discharge: 2021-12-01 | Disposition: A | Discharge status: Home / Self Care | Payer: PRIVATE HEALTH INSURANCE | Source: Ambulatory Visit | Attending: Hematology and Oncology | Admitting: Hematology and Oncology

## 2021-12-01 ENCOUNTER — Other Ambulatory Visit: Payer: Self-pay | Admitting: *Deleted

## 2021-12-01 DIAGNOSIS — Z452 Encounter for adjustment and management of vascular access device: Secondary | ICD-10-CM

## 2021-12-01 DIAGNOSIS — Z9189 Other specified personal risk factors, not elsewhere classified: Secondary | ICD-10-CM

## 2021-12-01 DIAGNOSIS — E782 Mixed hyperlipidemia: Secondary | ICD-10-CM

## 2021-12-01 DIAGNOSIS — D696 Thrombocytopenia, unspecified: Secondary | ICD-10-CM

## 2021-12-01 DIAGNOSIS — R3129 Other microscopic hematuria: Secondary | ICD-10-CM

## 2021-12-01 DIAGNOSIS — G8929 Other chronic pain: Secondary | ICD-10-CM

## 2021-12-01 DIAGNOSIS — E119 Type 2 diabetes mellitus without complications: Secondary | ICD-10-CM

## 2021-12-01 DIAGNOSIS — E876 Hypokalemia: Secondary | ICD-10-CM

## 2021-12-01 DIAGNOSIS — F17200 Nicotine dependence, unspecified, uncomplicated: Secondary | ICD-10-CM

## 2021-12-01 DIAGNOSIS — M5010 Cervical disc disorder with radiculopathy, unspecified cervical region: Secondary | ICD-10-CM

## 2021-12-01 DIAGNOSIS — M3119 Other thrombotic microangiopathy: Secondary | ICD-10-CM | POA: Diagnosis not present

## 2021-12-01 DIAGNOSIS — M7989 Other specified soft tissue disorders: Secondary | ICD-10-CM

## 2021-12-01 DIAGNOSIS — D539 Nutritional anemia, unspecified: Secondary | ICD-10-CM

## 2021-12-01 DIAGNOSIS — N289 Disorder of kidney and ureter, unspecified: Secondary | ICD-10-CM

## 2021-12-01 DIAGNOSIS — G4700 Insomnia, unspecified: Secondary | ICD-10-CM

## 2021-12-01 DIAGNOSIS — Z789 Other specified health status: Secondary | ICD-10-CM

## 2021-12-01 DIAGNOSIS — K5790 Diverticulosis of intestine, part unspecified, without perforation or abscess without bleeding: Secondary | ICD-10-CM

## 2021-12-01 DIAGNOSIS — R0789 Other chest pain: Secondary | ICD-10-CM

## 2021-12-01 DIAGNOSIS — J302 Other seasonal allergic rhinitis: Secondary | ICD-10-CM

## 2021-12-01 DIAGNOSIS — K219 Gastro-esophageal reflux disease without esophagitis: Secondary | ICD-10-CM

## 2021-12-01 DIAGNOSIS — I809 Phlebitis and thrombophlebitis of unspecified site: Secondary | ICD-10-CM

## 2021-12-01 DIAGNOSIS — I10 Essential (primary) hypertension: Secondary | ICD-10-CM

## 2021-12-01 LAB — BASIC METABOLIC PANEL
Anion gap: 6 (ref 5–15)
BUN: 17 mg/dL (ref 6–20)
CO2: 24 mmol/L (ref 22–32)
Calcium: 9 mg/dL (ref 8.9–10.3)
Chloride: 107 mmol/L (ref 98–111)
Creatinine, Ser: 0.99 mg/dL (ref 0.61–1.24)
GFR, Estimated: >60 mL/min (ref >60)
Glucose, Bld: 191 mg/dL — ABNORMAL HIGH (ref 70–99)
Potassium: 4.2 mmol/L (ref 3.5–5.1)
Sodium: 137 mmol/L (ref 135–145)

## 2021-12-01 LAB — CBC WITH DIFFERENTIAL/PLATELET
Abs Immature Granulocytes: 0.08 10*3/uL — ABNORMAL HIGH (ref 0.00–0.07)
Basophils Absolute: 0 10*3/uL (ref 0.0–0.1)
Basophils Relative: 0 %
Eosinophils Absolute: 0.1 10*3/uL (ref 0.0–0.5)
Eosinophils Relative: 1 %
HCT: 28.8 % — ABNORMAL LOW (ref 39.0–52.0)
Hemoglobin: 9.3 g/dL — ABNORMAL LOW (ref 13.0–17.0)
Immature Granulocytes: 1 %
Lymphocytes Relative: 20 %
Lymphs Abs: 2.6 10*3/uL (ref 0.7–4.0)
MCH: 33 pg (ref 26.0–34.0)
MCHC: 32.3 g/dL (ref 30.0–36.0)
MCV: 102.1 fL — ABNORMAL HIGH (ref 80.0–100.0)
Monocytes Absolute: 0.6 10*3/uL (ref 0.1–1.0)
Monocytes Relative: 4 %
Neutro Abs: 9.6 10*3/uL — ABNORMAL HIGH (ref 1.7–7.7)
Neutrophils Relative %: 74 %
Platelets: 310 10*3/uL (ref 150–400)
RBC: 2.82 MIL/uL — ABNORMAL LOW (ref 4.22–5.81)
RDW: 19.6 % — ABNORMAL HIGH (ref 11.5–15.5)
WBC: 13 10*3/uL — ABNORMAL HIGH (ref 4.0–10.5)
nRBC: 1 % — ABNORMAL HIGH (ref 0.0–0.2)

## 2021-12-01 MED ORDER — ACETAMINOPHEN 325 MG PO TABS
650.0000 mg | ORAL_TABLET | ORAL | Status: DC | PRN
  Administered 2021-12-01: 650 mg via ORAL

## 2021-12-01 MED ORDER — DIPHENHYDRAMINE HCL 25 MG PO CAPS
ORAL_CAPSULE | ORAL | Status: AC
  Administered 2021-12-01: 25 mg
  Filled 2021-12-01: qty 1

## 2021-12-01 MED ORDER — ANTICOAGULANT SODIUM CITRATE 4% (200MG/5ML) IV SOLN
5.0000 mL | Freq: Once | Status: DC
  Filled 2021-12-01: qty 5

## 2021-12-01 MED ORDER — ACETAMINOPHEN 325 MG PO TABS
ORAL_TABLET | ORAL | Status: AC
  Filled 2021-12-01: qty 2

## 2021-12-01 MED ORDER — CALCIUM CARBONATE ANTACID 500 MG PO CHEW: 2.0000 | CHEWABLE_TABLET | ORAL | Status: DC

## 2021-12-01 MED ORDER — ACD FORMULA A 0.73-2.45-2.2 GM/100ML VI SOLN
Status: AC
  Administered 2021-12-01: 1000 mL
  Filled 2021-12-01: qty 1000

## 2021-12-01 MED ORDER — CALCIUM GLUCONATE-NACL 2-0.675 GM/100ML-% IV SOLN
INTRAVENOUS | Status: AC
  Administered 2021-12-01: 2000 mg via INTRAVENOUS
  Filled 2021-12-01: qty 100

## 2021-12-01 MED ORDER — CALCIUM GLUCONATE-NACL 2-0.675 GM/100ML-% IV SOLN
2.0000 g | Freq: Once | INTRAVENOUS | Status: AC
Start: 2021-12-01 — End: 2021-12-01

## 2021-12-01 MED ORDER — DIPHENHYDRAMINE HCL 25 MG PO CAPS: 25.0000 mg | ORAL_CAPSULE | Freq: Four times a day (QID) | ORAL | Status: DC | PRN

## 2021-12-01 MED ORDER — ACD FORMULA A 0.73-2.45-2.2 GM/100ML VI SOLN
1000.0000 mL | Status: DC
  Filled 2021-12-01: qty 1000

## 2021-12-02 LAB — THERAPEUTIC PLASMA EXCHANGE (BLOOD BANK)
Plasma Exchange: 3609
Plasma volume needed: 3609
Unit division: 0
Unit division: 0
Unit division: 0
Unit division: 0
Unit division: 0
Unit division: 0
Unit division: 0

## 2021-12-02 LAB — PATHOLOGIST SMEAR REVIEW

## 2021-12-03 ENCOUNTER — Other Ambulatory Visit: Payer: Self-pay | Admitting: *Deleted

## 2021-12-03 ENCOUNTER — Non-Acute Institutional Stay (HOSPITAL_COMMUNITY)
Admission: RE | Admit: 2021-12-03 | Discharge: 2021-12-04 | Disposition: A | Discharge status: Home / Self Care | Payer: PRIVATE HEALTH INSURANCE | Source: Ambulatory Visit | Attending: Hematology and Oncology | Admitting: Hematology and Oncology

## 2021-12-03 DIAGNOSIS — E119 Type 2 diabetes mellitus without complications: Secondary | ICD-10-CM | POA: Diagnosis not present

## 2021-12-03 DIAGNOSIS — I10 Essential (primary) hypertension: Secondary | ICD-10-CM

## 2021-12-03 DIAGNOSIS — M3119 Other thrombotic microangiopathy: Secondary | ICD-10-CM | POA: Diagnosis present

## 2021-12-03 DIAGNOSIS — D696 Thrombocytopenia, unspecified: Secondary | ICD-10-CM

## 2021-12-03 LAB — BASIC METABOLIC PANEL
Anion gap: 12 (ref 5–15)
BUN: 14 mg/dL (ref 6–20)
CO2: 26 mmol/L (ref 22–32)
Calcium: 9 mg/dL (ref 8.9–10.3)
Chloride: 103 mmol/L (ref 98–111)
Creatinine, Ser: 0.9 mg/dL (ref 0.61–1.24)
GFR, Estimated: >60 mL/min (ref >60)
Glucose, Bld: 138 mg/dL — ABNORMAL HIGH (ref 70–99)
Potassium: 3.8 mmol/L (ref 3.5–5.1)
Sodium: 141 mmol/L (ref 135–145)

## 2021-12-03 LAB — CBC WITH DIFFERENTIAL/PLATELET
Abs Immature Granulocytes: 0.12 10*3/uL — ABNORMAL HIGH (ref 0.00–0.07)
Basophils Absolute: 0 10*3/uL (ref 0.0–0.1)
Basophils Relative: 0 %
Eosinophils Absolute: 0.3 10*3/uL (ref 0.0–0.5)
Eosinophils Relative: 2 %
HCT: 28.9 % — ABNORMAL LOW (ref 39.0–52.0)
Hemoglobin: 9.4 g/dL — ABNORMAL LOW (ref 13.0–17.0)
Immature Granulocytes: 1 %
Lymphocytes Relative: 43 %
Lymphs Abs: 6.9 10*3/uL — ABNORMAL HIGH (ref 0.7–4.0)
MCH: 33.9 pg (ref 26.0–34.0)
MCHC: 32.5 g/dL (ref 30.0–36.0)
MCV: 104.3 fL — ABNORMAL HIGH (ref 80.0–100.0)
Monocytes Absolute: 1.1 10*3/uL — ABNORMAL HIGH (ref 0.1–1.0)
Monocytes Relative: 7 %
Neutro Abs: 7.7 10*3/uL (ref 1.7–7.7)
Neutrophils Relative %: 47 %
Platelets: 271 10*3/uL (ref 150–400)
RBC: 2.77 MIL/uL — ABNORMAL LOW (ref 4.22–5.81)
RDW: 18.9 % — ABNORMAL HIGH (ref 11.5–15.5)
WBC: 16.3 10*3/uL — ABNORMAL HIGH (ref 4.0–10.5)
nRBC: 1.5 % — ABNORMAL HIGH (ref 0.0–0.2)

## 2021-12-03 MED ORDER — DIPHENHYDRAMINE HCL 25 MG PO CAPS: 25.0000 mg | ORAL_CAPSULE | Freq: Four times a day (QID) | ORAL | Status: DC | PRN

## 2021-12-03 MED ORDER — ACETAMINOPHEN 325 MG PO TABS
650.0000 mg | ORAL_TABLET | ORAL | Status: DC | PRN
Start: 2021-12-03 — End: 2021-12-04
  Administered 2021-12-03: 650 mg via ORAL

## 2021-12-03 MED ORDER — ACETAMINOPHEN 325 MG PO TABS
ORAL_TABLET | ORAL | Status: AC
  Filled 2021-12-03: qty 2

## 2021-12-03 MED ORDER — CALCIUM GLUCONATE-NACL 2-0.675 GM/100ML-% IV SOLN: 2.0000 g | Freq: Once | INTRAVENOUS | Status: DC

## 2021-12-03 MED ORDER — DIPHENHYDRAMINE HCL 25 MG PO CAPS
ORAL_CAPSULE | ORAL | Status: AC
  Administered 2021-12-03: 25 mg via ORAL
  Filled 2021-12-03: qty 1

## 2021-12-03 MED ORDER — CALCIUM GLUCONATE-NACL 2-0.675 GM/100ML-% IV SOLN
INTRAVENOUS | Status: AC
  Filled 2021-12-03: qty 100

## 2021-12-03 MED ORDER — ALTEPLASE 2 MG IJ SOLR
2.0000 mg | Freq: Once | INTRAMUSCULAR | Status: AC
Start: 2021-12-03 — End: 2021-12-03
  Administered 2021-12-03: 2 mg

## 2021-12-03 MED ORDER — ANTICOAGULANT SODIUM CITRATE 4% (200MG/5ML) IV SOLN
5.0000 mL | Freq: Once | Status: AC
  Administered 2021-12-03: 5 mL
  Filled 2021-12-03: qty 5

## 2021-12-03 MED ORDER — ALTEPLASE 2 MG IJ SOLR
2.0000 mg | Freq: Once | INTRAMUSCULAR | Status: AC
Start: 2021-12-03 — End: 2021-12-03
  Administered 2021-12-03: 2 mg
  Filled 2021-12-03: qty 2

## 2021-12-03 MED ORDER — ACD FORMULA A 0.73-2.45-2.2 GM/100ML VI SOLN
Status: AC
  Filled 2021-12-03: qty 1000

## 2021-12-03 MED ORDER — ACD FORMULA A 0.73-2.45-2.2 GM/100ML VI SOLN
1000.0000 mL | Status: DC
  Administered 2021-12-03: 1000 mL

## 2021-12-03 NOTE — Procedures (Signed)
Instilled Cathflo in Both hemodialysis port per Dr. Zenda Alpers for 30 mins before Plasma Treatment TPE.

## 2021-12-04 ENCOUNTER — Encounter: Payer: Self-pay | Admitting: *Deleted

## 2021-12-04 ENCOUNTER — Encounter: Payer: Self-pay | Admitting: Nurse Practitioner

## 2021-12-04 ENCOUNTER — Inpatient Hospital Stay: Payer: Medicaid Other

## 2021-12-04 ENCOUNTER — Telehealth: Payer: Self-pay | Admitting: *Deleted

## 2021-12-04 ENCOUNTER — Other Ambulatory Visit: Payer: Self-pay | Admitting: *Deleted

## 2021-12-04 ENCOUNTER — Inpatient Hospital Stay (HOSPITAL_BASED_OUTPATIENT_CLINIC_OR_DEPARTMENT_OTHER): Payer: Medicaid Other | Admitting: Nurse Practitioner

## 2021-12-04 ENCOUNTER — Inpatient Hospital Stay: Payer: Medicaid Other | Attending: Oncology

## 2021-12-04 VITALS — BP 120/75 | HR 69 | Temp 98.4°F | Resp 18

## 2021-12-04 VITALS — BP 128/87 | HR 83 | Temp 98.2°F | Resp 20 | Ht 70.0 in | Wt 192.0 lb

## 2021-12-04 DIAGNOSIS — M3119 Other thrombotic microangiopathy: Secondary | ICD-10-CM | POA: Insufficient documentation

## 2021-12-04 DIAGNOSIS — Z452 Encounter for adjustment and management of vascular access device: Secondary | ICD-10-CM | POA: Insufficient documentation

## 2021-12-04 DIAGNOSIS — D696 Thrombocytopenia, unspecified: Secondary | ICD-10-CM | POA: Diagnosis not present

## 2021-12-04 DIAGNOSIS — I10 Essential (primary) hypertension: Secondary | ICD-10-CM | POA: Insufficient documentation

## 2021-12-04 DIAGNOSIS — E119 Type 2 diabetes mellitus without complications: Secondary | ICD-10-CM | POA: Insufficient documentation

## 2021-12-04 LAB — CMP (CANCER CENTER ONLY)
ALT: 45 U/L — ABNORMAL HIGH (ref 0–44)
AST: 24 U/L (ref 15–41)
Albumin: 3.5 g/dL (ref 3.5–5.0)
Alkaline Phosphatase: 56 U/L (ref 38–126)
Anion gap: 9 (ref 5–15)
BUN: 15 mg/dL (ref 6–20)
CO2: 26 mmol/L (ref 22–32)
Calcium: 8.6 mg/dL — ABNORMAL LOW (ref 8.9–10.3)
Chloride: 105 mmol/L (ref 98–111)
Creatinine: 0.99 mg/dL (ref 0.61–1.24)
GFR, Estimated: >60 mL/min (ref >60)
Glucose, Bld: 133 mg/dL — ABNORMAL HIGH (ref 70–99)
Potassium: 3.9 mmol/L (ref 3.5–5.1)
Sodium: 140 mmol/L (ref 135–145)
Total Bilirubin: 0.8 mg/dL (ref 0.3–1.2)
Total Protein: 5.9 g/dL — ABNORMAL LOW (ref 6.5–8.1)

## 2021-12-04 LAB — THERAPEUTIC PLASMA EXCHANGE (BLOOD BANK)
Plasma Exchange: 3609
Plasma volume needed: 3609
Unit division: 0
Unit division: 0
Unit division: 0
Unit division: 0
Unit division: 0
Unit division: 0
Unit division: 0
Unit division: 0
Unit division: 0
Unit division: 0

## 2021-12-04 LAB — CBC WITH DIFFERENTIAL (CANCER CENTER ONLY)
Abs Immature Granulocytes: 0.11 10*3/uL — ABNORMAL HIGH (ref 0.00–0.07)
Basophils Absolute: 0 10*3/uL (ref 0.0–0.1)
Basophils Relative: 0 %
Eosinophils Absolute: 0.4 10*3/uL (ref 0.0–0.5)
Eosinophils Relative: 2 %
HCT: 30.8 % — ABNORMAL LOW (ref 39.0–52.0)
Hemoglobin: 10.1 g/dL — ABNORMAL LOW (ref 13.0–17.0)
Immature Granulocytes: 1 %
Lymphocytes Relative: 32 %
Lymphs Abs: 5.2 10*3/uL — ABNORMAL HIGH (ref 0.7–4.0)
MCH: 33.6 pg (ref 26.0–34.0)
MCHC: 32.8 g/dL (ref 30.0–36.0)
MCV: 102.3 fL — ABNORMAL HIGH (ref 80.0–100.0)
Monocytes Absolute: 1 10*3/uL (ref 0.1–1.0)
Monocytes Relative: 6 %
Neutro Abs: 9.8 10*3/uL — ABNORMAL HIGH (ref 1.7–7.7)
Neutrophils Relative %: 59 %
Platelet Count: 279 10*3/uL (ref 150–400)
RBC: 3.01 MIL/uL — ABNORMAL LOW (ref 4.22–5.81)
RDW: 18.5 % — ABNORMAL HIGH (ref 11.5–15.5)
WBC Count: 16.5 10*3/uL — ABNORMAL HIGH (ref 4.0–10.5)
nRBC: 1.9 % — ABNORMAL HIGH (ref 0.0–0.2)

## 2021-12-04 LAB — LACTATE DEHYDROGENASE: LDH: 210 U/L — ABNORMAL HIGH (ref 98–192)

## 2021-12-04 MED ORDER — FAMOTIDINE IN NACL 20-0.9 MG/50ML-% IV SOLN
20.0000 mg | Freq: Once | INTRAVENOUS | Status: AC
  Administered 2021-12-04: 20 mg via INTRAVENOUS

## 2021-12-04 MED ORDER — SODIUM CHLORIDE 0.9 % IV SOLN
375.0000 mg/m2 | Freq: Once | INTRAVENOUS | Status: AC
  Administered 2021-12-04: 800 mg via INTRAVENOUS
  Filled 2021-12-04: qty 50

## 2021-12-04 MED ORDER — SODIUM CHLORIDE 0.9 % IV SOLN: Freq: Once | INTRAVENOUS | Status: AC

## 2021-12-04 MED ORDER — ACETAMINOPHEN 325 MG PO TABS
650.0000 mg | ORAL_TABLET | Freq: Once | ORAL | Status: AC
  Administered 2021-12-04: 650 mg via ORAL

## 2021-12-04 MED ORDER — DIPHENHYDRAMINE HCL 25 MG PO CAPS
50.0000 mg | ORAL_CAPSULE | Freq: Once | ORAL | Status: AC
  Administered 2021-12-04: 50 mg via ORAL

## 2021-12-04 NOTE — Progress Notes (Signed)
Patient seen by Ned Card NP today  Vitals are within treatment parameters.  Labs reviewed by Ned Card NP and are within treatment parameters. Does not need CMP result today to treat.  Per physician team, patient is ready for treatment and there are NO modifications to the treatment plan.

## 2021-12-04 NOTE — Patient Instructions (Signed)
Eric Hooper  Discharge Instructions: Thank you for choosing [facility] to provide your oncology and hematology care.   If you have a lab appointment with the [facility], please go directly to the [facility] and check in at the registration area.   Wear comfortable clothing and clothing appropriate for easy access to any Portacath or PICC line.   We strive to give you quality time with your provider. You may need to reschedule your appointment if you arrive late (15 or more minutes).  Arriving late affects you and other patients whose appointments are after yours.  Also, if you miss three or more appointments without notifying the office, you may be dismissed from the clinic at the provider's discretion.      For prescription refill requests, have your pharmacy contact our office and allow 72 hours for refills to be completed.    Today you received the following chemotherapy and/or immunotherapy agents: rituximab      To help prevent nausea and vomiting after your treatment, we encourage you to take your nausea medication as directed.  BELOW ARE SYMPTOMS THAT SHOULD BE REPORTED IMMEDIATELY: *FEVER GREATER THAN 100.4 F (38 C) OR HIGHER *CHILLS OR SWEATING *NAUSEA AND VOMITING THAT IS NOT CONTROLLED WITH YOUR NAUSEA MEDICATION *UNUSUAL SHORTNESS OF BREATH *UNUSUAL BRUISING OR BLEEDING *URINARY PROBLEMS (pain or burning when urinating, or frequent urination) *BOWEL PROBLEMS (unusual diarrhea, constipation, pain near the anus) TENDERNESS IN MOUTH AND THROAT WITH OR WITHOUT PRESENCE OF ULCERS (sore throat, sores in mouth, or a toothache) UNUSUAL RASH, SWELLING OR PAIN  UNUSUAL VAGINAL DISCHARGE OR ITCHING   Items with * indicate a potential emergency and should be followed up as soon as possible or go to the Emergency Department if any problems should occur.  Please show the CHEMOTHERAPY ALERT CARD or IMMUNOTHERAPY ALERT CARD at check-in to the  Emergency Department and triage nurse.  Should you have questions after your visit or need to cancel or reschedule your appointment, please contact [facility]  Dept: [phone]  and follow the prompts.  Office hours are 8:00 a.m. to 4:30 p.m. Monday - Friday. Please note that voicemails left after 4:00 p.m. may not be returned until the following business day.  We are closed weekends and major holidays. You have access to a nurse at all times for urgent questions. Please call the main number to the clinic Dept: [phone] and follow the prompts.   For any non-urgent questions, you may also contact your provider using MyChart. We now offer e-Visits for anyone 29 and older to request care online for non-urgent symptoms. For details visit [email or web address].   Also download the MyChart app! Go to the app store, search "MyChart", open the app, select [facility], and log in with your MyChart username and password.  Masks are optional in the cancer centers. If you would like for your care team to wear a mask while they are taking care of you, please let them know. You may have one support person who is at least 48 years old accompany you for your appointments.  Rituximab Injection What is this medication? RITUXIMAB (ri TUX i mab) treats leukemia and lymphoma. It works by blocking a protein that causes cancer cells to grow and multiply. This helps to slow or stop the spread of cancer cells. It may also be used to treat autoimmune conditions, such as arthritis. It works by slowing down an overactive immune system.  It is a monoclonal antibody. This medicine may be used for other purposes; ask your health care provider or pharmacist if you have questions. COMMON BRAND NAME(S): RIABNI, Rituxan, RUXIENCE, truxima What should I tell my care team before I take this medication? They need to know if you have any of these conditions: Chest pain Heart disease Immune system  problems Infection, such as chickenpox, cold sores, hepatitis B, herpes Irregular heartbeat or rhythm Kidney disease Low blood counts, such as low white cells, platelets, red cells Lung disease Recent or upcoming vaccine An unusual or allergic reaction to rituximab, other medications, foods, dyes, or preservatives Pregnant or trying to get pregnant Breast-feeding How should I use this medication? This medication is injected into a vein. It is given by a care team in a hospital or clinic setting. A special MedGuide will be given to you before each treatment. Be sure to read this information carefully each time. Talk to your care team about the use of this medication in children. While this medication may be prescribed for children as young as 6 months for selected conditions, precautions do apply. Overdosage: If you think you have taken too much of this medicine contact a poison control center or emergency room at once. NOTE: This medicine is only for you. Do not share this medicine with others. What if I miss a dose? Keep appointments for follow-up doses. It is important not to miss your dose. Call your care team if you are unable to keep an appointment. What may interact with this medication? Do not take this medication with any of the following: Live vaccines This medication may also interact with the following: Cisplatin This list may not describe all possible interactions. Give your health care provider a list of all the medicines, herbs, non-prescription drugs, or dietary supplements you use. Also tell them if you smoke, drink alcohol, or use illegal drugs. Some items may interact with your medicine. What should I watch for while using this medication? Your condition will be monitored carefully while you are receiving this medication. You may need blood work while taking this medication. This medication can cause serious infusion reactions. To reduce the risk your care team may give  you other medications to take before receiving this one. Be sure to follow the directions from your care team. This medication may increase your risk of getting an infection. Call your care team for advice if you get a fever, chills, sore throat, or other symptoms of a cold or flu. Do not treat yourself. Try to avoid being around people who are sick. Call your care team if you are around anyone with measles, chickenpox, or if you develop sores or blisters that do not heal properly. Avoid taking medications that contain aspirin, acetaminophen, ibuprofen, naproxen, or ketoprofen unless instructed by your care team. These medications may hide a fever. This medication may cause serious skin reactions. They can happen weeks to months after starting the medication. Contact your care team right away if you notice fevers or flu-like symptoms with a rash. The rash may be red or purple and then turn into blisters or peeling of the skin. You may also notice a red rash with swelling of the face, lips, or lymph nodes in your neck or under your arms. In some patients, this medication may cause a serious brain infection that may cause death. If you have any problems seeing, thinking, speaking, walking, or standing, tell your care team right away. If you cannot reach your  care team, urgently seek another source of medical care. Talk to your care team if you may be pregnant. Serious birth defects can occur if you take this medication during pregnancy and for 12 months after the last dose. You will need a negative pregnancy test before starting this medication. Contraception is recommended while taking this medication and for 12 months after the last dose. Your care team can help you find the option that works for you. Do not breastfeed while taking this medication and for at least 6 months after the last dose. What side effects may I notice from receiving this medication? Side effects that you should report to your care  team as soon as possible: Allergic reactions or angioedema--skin rash, itching or hives, swelling of the face, eyes, lips, tongue, arms, or legs, trouble swallowing or breathing Bowel blockage--stomach cramping, unable to have a bowel movement or pass gas, loss of appetite, vomiting Dizziness, loss of balance or coordination, confusion or trouble speaking Heart attack--pain or tightness in the chest, shoulders, arms, or jaw, nausea, shortness of breath, cold or clammy skin, feeling faint or lightheaded Heart rhythm changes--fast or irregular heartbeat, dizziness, feeling faint or lightheaded, chest pain, trouble breathing Infection--fever, chills, cough, sore throat, wounds that don't heal, pain or trouble when passing urine, general feeling of discomfort or being unwell Infusion reactions--chest pain, shortness of breath or trouble breathing, feeling faint or lightheaded Kidney injury--decrease in the amount of urine, swelling of the ankles, hands, or feet Liver injury--right upper belly pain, loss of appetite, nausea, light-colored stool, dark yellow or brown urine, yellowing skin or eyes, unusual weakness or fatigue Redness, blistering, peeling, or loosening of the skin, including inside the mouth Stomach pain that is severe, does not go away, or gets worse Tumor lysis syndrome (TLS)--nausea, vomiting, diarrhea, decrease in the amount of urine, dark urine, unusual weakness or fatigue, confusion, muscle pain or cramps, fast or irregular heartbeat, joint pain Side effects that usually do not require medical attention (report to your care team if they continue or are bothersome): Headache Joint pain Nausea Runny or stuffy nose Unusual weakness or fatigue This list may not describe all possible side effects. Call your doctor for medical advice about side effects. You may report side effects to FDA at 1-800-FDA-1088. Where should I keep my medication? This medication is given in a hospital or  clinic. It will not be stored at home. NOTE: This sheet is a summary. It may not cover all possible information. If you have questions about this medicine, talk to your doctor, pharmacist, or health care provider.  2023 Elsevier/Gold Standard (2021-09-01 00:00:00)

## 2021-12-04 NOTE — Progress Notes (Signed)
Pharmacist Chemotherapy Monitoring - Initial Assessment    Anticipated start date: 12/04/21   The following has been reviewed per standard work regarding the patient's treatment regimen: The patient's diagnosis, treatment plan and drug doses, and organ/hematologic function Lab orders and baseline tests specific to treatment regimen  The treatment plan start date, drug sequencing, and pre-medications Prior authorization status  Patient's documented medication list, including drug-drug interaction screen and prescriptions for anti-emetics and supportive care specific to the treatment regimen The drug concentrations, fluid compatibility, administration routes, and timing of the medications to be used The patient's access for treatment and lifetime cumulative dose history, if applicable  The patient's medication allergies and previous infusion related reactions, if applicable   Changes made to treatment plan:  N/A  Follow up needed:  N/A   Patrica Duel, RPH, 12/04/2021  10:03 AM

## 2021-12-04 NOTE — Telephone Encounter (Signed)
Patient is late for his lab/OV/tx today. Left VM that he needs to get here asap to receive his treatment or call if he is not able to come

## 2021-12-04 NOTE — Progress Notes (Signed)
[name] OFFICE PROGRESS NOTE   Diagnosis: TTP  INTERVAL HISTORY:   Eric Hooper returns for follow-up.  He was recently hospitalized with relapsed TTP.  He was discharged 11/29/2021.  Current pheresis schedule is Monday Wednesday Friday.  Current prednisone dose 60 mg daily.  He denies bleeding.  No unusual headaches.  He notes vision is somewhat blurry.  He has felt "weak" since leaving the hospital.  He denies shortness of breath.  No fever.  Objective:  Vital signs in last 24 hours:  Blood pressure 128/87, pulse 83, temperature 98.2 F (36.8 C), temperature source Oral, resp. rate 20, height '5\' 10"'$  (1.778 m), weight 192 lb (87.1 kg), SpO2 100 %.    HEENT: No thrush or ulcers. Resp: Lungs clear bilaterally. Cardio: Regular rate and rhythm. GI: Abdomen soft and nontender.  No hepatosplenomegaly. Vascular: No leg edema. Neuro: Alert and oriented. Pheresis catheter without erythema.  Lab Results:  Lab Results  Component Value Date   WBC 16.5 (H) 12/04/2021   HGB 10.1 (L) 12/04/2021   HCT 30.8 (L) 12/04/2021   MCV 102.3 (H) 12/04/2021   PLT 279 12/04/2021   NEUTROABS 9.8 (H) 12/04/2021    Imaging:  No results found.  Medications: I have reviewed the patient's current medications.  Assessment/Plan: 1.  TTP initially diagnosed in October 2018 with recurrence in April 2020 and July 2023. Initiation of daily plasma exchange and Solu-Medrol 02/02/2017 [name] Antibody- 72, activity less than 2% on initial presentation; [name] from 08/15/2018 <2.0. Last plasma exchange 02/06/2017 Prednisone 60 mg daily beginning 02/08/2017 Prednisone 40 mg daily beginning 02/17/2017 Prednisone 30 mg daily beginning 03/09/2017 Prednisone 20 mg daily beginning 03/23/2017 Prednisone 10 mg daily beginning 04/01/2017 Prednisone 5 mg daily for 5 days then 5 mg every other day for 5 doses then stop beginning 05/11/2017 Recurrent TTP 08/14/2018 Plasma exchange/steroids  started on 08/15/2018.  Plasma exchange discontinued after treatment on 08/19/2019, discharged on prednisone '60mg'$  daily Prednisone taper to 40 mg daily 08/22/2018 Weekly rituximab for 4 doses beginning 08/25/2018 Plasma exchange resumed 08/26/2018, 08/27/2018, 08/28/2018, 08/29/2018, 08/31/2018, 09/02/2018 Prednisone taper to 30 mg daily 09/01/2018 Cycle 3 weekly Rituxan 09/08/2018 Prednisone taper to 20 mg daily 09/08/2018 Cycle 4 weekly Rituxan 09/15/2018 Prednisone taper to 10 mg daily 09/20/2018 Prednisone taper to 5 mg daily 09/27/2018 Prednisone discontinued 10/07/2018 Admission with relapse of TTP 10/23/2021 Daily Plasma exchange initiated 11/23/2021 prednisone 8/1 Prednisone tapered to 40 mg daily beginning 12/04/2021 Weekly Rituxan for 4 doses beginning 12/04/2021 Discontinue plasmapheresis after appointment 12/05/2021 2. Xiphoid pain-etiology unclear 3. Hypertension 4. Diabetes 5. Alcohol/Tobacco use 6. History of renal insufficiency 7. Right IJ dialysis catheter placed 02/02/2017, removed New right IJ dialysis catheter 08/15/2018 Catheter removed 09/12/2018 New right IJ dialysis catheter placed 11/23/2021 Temporary hemodialysis catheter converted to tunneled hemodialysis catheter 11/28/2021    Disposition: [name] has relapsed TTP.  He is currently completing plasmapheresis on a Monday Wednesday Friday schedule.  We reviewed the CBC from today.  Platelet count is stable in normal range.  Hemoglobin is better.  He will complete plasmapheresis on 12/05/2021, then discontinue.  He will taper prednisone from 60 mg daily to 40 mg daily.  He begins Rituxan weekly x 4 today.  He has had Rituxan in the past.  We again reviewed potential toxicities.  He agrees with the plan as outlined above.    He will return for a CBC on 12/08/2021.  We will see him prior to week 2 Rituxan on 12/11/2021.  He  will contact the office in the interim with any problems.  Patient seen with Dr. Benay Spice.   Ned Card ANP/GNP-BC    12/04/2021  9:10 AM This was a shared visit with Ned Card.  Mr. Keadle continues to tolerate the plasma exchange well.  The hemoglobin continues to improve and the platelet count is in the normal range.  The plan is to complete 2 more plasma exchange treatments this week.  He will begin weekly rituximab today.  He will continue a slow prednisone taper.  We will check an [name] level next week.  I was present for greater than 50% of today's visit.  I performed medical decision making.  Julieanne Manson, MD

## 2021-12-05 ENCOUNTER — Non-Acute Institutional Stay (HOSPITAL_COMMUNITY)
Admission: AD | Admit: 2021-12-05 | Discharge: 2021-12-05 | Disposition: A | Discharge status: Home / Self Care | Payer: PRIVATE HEALTH INSURANCE | Source: Ambulatory Visit | Attending: Hematology and Oncology | Admitting: Hematology and Oncology

## 2021-12-05 ENCOUNTER — Telehealth: Payer: Self-pay | Admitting: *Deleted

## 2021-12-05 DIAGNOSIS — M3119 Other thrombotic microangiopathy: Secondary | ICD-10-CM | POA: Diagnosis present

## 2021-12-05 MED ORDER — CALCIUM GLUCONATE-NACL 2-0.675 GM/100ML-% IV SOLN
2.0000 g | Freq: Once | INTRAVENOUS | Status: AC
  Administered 2021-12-05: 2000 mg via INTRAVENOUS
  Filled 2021-12-05: qty 100

## 2021-12-05 MED ORDER — CALCIUM CARBONATE ANTACID 500 MG PO CHEW
2.0000 | CHEWABLE_TABLET | ORAL | Status: DC
  Administered 2021-12-05: 400 mg via ORAL
  Filled 2021-12-05: qty 2

## 2021-12-05 MED ORDER — ANTICOAGULANT SODIUM CITRATE 4% (200MG/5ML) IV SOLN
5.0000 mL | Freq: Once | Status: DC
  Filled 2021-12-05: qty 5

## 2021-12-05 MED ORDER — DIPHENHYDRAMINE HCL 25 MG PO CAPS
25.0000 mg | ORAL_CAPSULE | Freq: Four times a day (QID) | ORAL | Status: DC | PRN
  Administered 2021-12-05: 25 mg via ORAL
  Filled 2021-12-05: qty 1

## 2021-12-05 MED ORDER — ACETAMINOPHEN 325 MG PO TABS
650.0000 mg | ORAL_TABLET | ORAL | Status: DC | PRN
  Administered 2021-12-05: 650 mg via ORAL
  Filled 2021-12-05: qty 2

## 2021-12-05 MED ORDER — ACD FORMULA A 0.73-2.45-2.2 GM/100ML VI SOLN
1000.0000 mL | Status: DC
  Administered 2021-12-05: 1000 mL
  Filled 2021-12-05: qty 1000

## 2021-12-05 NOTE — Telephone Encounter (Signed)
Informed Eric Hooper that [facility] at [facility] will be able to flush his pheresis cath on 8/14 and 8/17. Nurse educator will bring caps to facility to change. He reports having no adverse effect from the Rituximab and feels well.

## 2021-12-05 NOTE — Progress Notes (Signed)
Patient tolerated TPE without adverse events.  VS post treatment.  Patient aware of follow up with Dr. Benay Spice on next week. Patient discharged to home with self care.

## 2021-12-06 LAB — THERAPEUTIC PLASMA EXCHANGE (BLOOD BANK)
Plasma volume needed: 3600
Unit division: 0
Unit division: 0
Unit division: 0
Unit division: 0
Unit division: 0
Unit division: 0
Unit division: 0
Unit division: 0

## 2021-12-07 ENCOUNTER — Other Ambulatory Visit: Payer: Self-pay | Admitting: Oncology

## 2021-12-08 ENCOUNTER — Inpatient Hospital Stay: Payer: Medicaid Other

## 2021-12-08 DIAGNOSIS — I10 Essential (primary) hypertension: Secondary | ICD-10-CM | POA: Diagnosis not present

## 2021-12-08 DIAGNOSIS — M3119 Other thrombotic microangiopathy: Secondary | ICD-10-CM

## 2021-12-08 DIAGNOSIS — D696 Thrombocytopenia, unspecified: Secondary | ICD-10-CM

## 2021-12-08 DIAGNOSIS — Z452 Encounter for adjustment and management of vascular access device: Secondary | ICD-10-CM | POA: Diagnosis present

## 2021-12-08 DIAGNOSIS — E119 Type 2 diabetes mellitus without complications: Secondary | ICD-10-CM | POA: Diagnosis not present

## 2021-12-08 LAB — CBC WITH DIFFERENTIAL (CANCER CENTER ONLY)
Abs Immature Granulocytes: 0.05 10*3/uL (ref 0.00–0.07)
Basophils Absolute: 0 10*3/uL (ref 0.0–0.1)
Basophils Relative: 0 %
Eosinophils Absolute: 0.2 10*3/uL (ref 0.0–0.5)
Eosinophils Relative: 2 %
HCT: 32.3 % — ABNORMAL LOW (ref 39.0–52.0)
Hemoglobin: 10.6 g/dL — ABNORMAL LOW (ref 13.0–17.0)
Immature Granulocytes: 1 %
Lymphocytes Relative: 35 %
Lymphs Abs: 3.6 10*3/uL (ref 0.7–4.0)
MCH: 34 pg (ref 26.0–34.0)
MCHC: 32.8 g/dL (ref 30.0–36.0)
MCV: 103.5 fL — ABNORMAL HIGH (ref 80.0–100.0)
Monocytes Absolute: 0.9 10*3/uL (ref 0.1–1.0)
Monocytes Relative: 9 %
Neutro Abs: 5.6 10*3/uL (ref 1.7–7.7)
Neutrophils Relative %: 53 %
Platelet Count: 142 10*3/uL — ABNORMAL LOW (ref 150–400)
RBC: 3.12 MIL/uL — ABNORMAL LOW (ref 4.22–5.81)
RDW: 17.1 % — ABNORMAL HIGH (ref 11.5–15.5)
WBC Count: 10.4 10*3/uL (ref 4.0–10.5)
nRBC: 0.3 % — ABNORMAL HIGH (ref 0.0–0.2)

## 2021-12-08 MED ORDER — HEPARIN SODIUM (PORCINE) 5000 UNIT/ML IJ SOLN
1600.0000 [IU] | Freq: Once | INTRAMUSCULAR | Status: AC
  Administered 2021-12-08: 1600 [IU] via INTRAVENOUS
  Filled 2021-12-08: qty 1

## 2021-12-08 MED ORDER — SODIUM CHLORIDE 0.9% FLUSH
10.0000 mL | INTRAVENOUS | Status: DC | PRN
  Administered 2021-12-08 (×2): 10 mL via INTRAVENOUS

## 2021-12-09 ENCOUNTER — Encounter: Payer: Self-pay | Admitting: Oncology

## 2021-12-10 ENCOUNTER — Encounter: Payer: Self-pay | Admitting: Oncology

## 2021-12-11 ENCOUNTER — Inpatient Hospital Stay: Admit: 2021-12-11 | Payer: PRIVATE HEALTH INSURANCE | Admitting: Internal Medicine

## 2021-12-11 ENCOUNTER — Inpatient Hospital Stay (HOSPITAL_BASED_OUTPATIENT_CLINIC_OR_DEPARTMENT_OTHER): Payer: Medicaid Other | Admitting: Nurse Practitioner

## 2021-12-11 ENCOUNTER — Encounter: Payer: Self-pay | Admitting: *Deleted

## 2021-12-11 ENCOUNTER — Inpatient Hospital Stay: Payer: Medicaid Other

## 2021-12-11 ENCOUNTER — Encounter: Payer: Self-pay | Admitting: Nurse Practitioner

## 2021-12-11 ENCOUNTER — Encounter (HOSPITAL_COMMUNITY): Payer: Self-pay

## 2021-12-11 ENCOUNTER — Telehealth: Payer: Self-pay

## 2021-12-11 VITALS — BP 151/94 | HR 83 | Temp 98.2°F | Resp 18 | Ht 70.0 in | Wt 195.4 lb

## 2021-12-11 DIAGNOSIS — M3119 Other thrombotic microangiopathy: Secondary | ICD-10-CM

## 2021-12-11 DIAGNOSIS — Z452 Encounter for adjustment and management of vascular access device: Secondary | ICD-10-CM | POA: Diagnosis not present

## 2021-12-11 DIAGNOSIS — D696 Thrombocytopenia, unspecified: Secondary | ICD-10-CM

## 2021-12-11 LAB — CBC WITH DIFFERENTIAL (CANCER CENTER ONLY)
Abs Immature Granulocytes: 0.08 10*3/uL — ABNORMAL HIGH (ref 0.00–0.07)
Basophils Absolute: 0 10*3/uL (ref 0.0–0.1)
Basophils Relative: 0 %
Eosinophils Absolute: 0.2 10*3/uL (ref 0.0–0.5)
Eosinophils Relative: 1 %
HCT: 33.2 % — ABNORMAL LOW (ref 39.0–52.0)
Hemoglobin: 11 g/dL — ABNORMAL LOW (ref 13.0–17.0)
Immature Granulocytes: 1 %
Lymphocytes Relative: 34 %
Lymphs Abs: 4.1 10*3/uL — ABNORMAL HIGH (ref 0.7–4.0)
MCH: 33.8 pg (ref 26.0–34.0)
MCHC: 33.1 g/dL (ref 30.0–36.0)
MCV: 102.2 fL — ABNORMAL HIGH (ref 80.0–100.0)
Monocytes Absolute: 0.9 10*3/uL (ref 0.1–1.0)
Monocytes Relative: 7 %
Neutro Abs: 6.8 10*3/uL (ref 1.7–7.7)
Neutrophils Relative %: 57 %
Platelet Count: 24 10*3/uL — ABNORMAL LOW (ref 150–400)
RBC: 3.25 MIL/uL — ABNORMAL LOW (ref 4.22–5.81)
RDW: 16.9 % — ABNORMAL HIGH (ref 11.5–15.5)
WBC Count: 12 10*3/uL — ABNORMAL HIGH (ref 4.0–10.5)
nRBC: 0.4 % — ABNORMAL HIGH (ref 0.0–0.2)

## 2021-12-11 LAB — CMP (CANCER CENTER ONLY)
ALT: 33 U/L (ref 0–44)
AST: 16 U/L (ref 15–41)
Albumin: 3.9 g/dL (ref 3.5–5.0)
Alkaline Phosphatase: 60 U/L (ref 38–126)
Anion gap: 8 (ref 5–15)
BUN: 19 mg/dL (ref 6–20)
CO2: 25 mmol/L (ref 22–32)
Calcium: 8.8 mg/dL — ABNORMAL LOW (ref 8.9–10.3)
Chloride: 104 mmol/L (ref 98–111)
Creatinine: 0.92 mg/dL (ref 0.61–1.24)
GFR, Estimated: >60 mL/min (ref >60)
Glucose, Bld: 280 mg/dL — ABNORMAL HIGH (ref 70–99)
Potassium: 4 mmol/L (ref 3.5–5.1)
Sodium: 137 mmol/L (ref 135–145)
Total Bilirubin: 2.8 mg/dL — ABNORMAL HIGH (ref 0.3–1.2)
Total Protein: 5.9 g/dL — ABNORMAL LOW (ref 6.5–8.1)

## 2021-12-11 LAB — LACTATE DEHYDROGENASE: LDH: 501 U/L — ABNORMAL HIGH (ref 98–192)

## 2021-12-11 NOTE — Progress Notes (Signed)
[facility] OFFICE PROGRESS NOTE   Diagnosis: TTP  INTERVAL HISTORY:   Mr. Coltrane returns as scheduled.  He completed cycle 1 week 1 Rituxan 12/04/2021.  Current prednisone dose is 40 mg daily.  Last plasmapheresis 12/05/2021.  He tolerated the Rituxan well with no signs of allergic reaction.  He denies bleeding.  No dizziness.  No hematuria.  Main complaint is constipation.  Objective:  Vital signs in last 24 hours:  Blood pressure (!) 151/94, pulse 83, temperature 98.2 F (36.8 C), temperature source Oral, resp. rate 18, height '5\' 10"'$  (1.778 m), weight 195 lb 6.4 oz (88.6 kg), SpO2 100 %.    HEENT: No thrush or ulcers. Resp: Lungs clear bilaterally. Cardio: Regular rate and rhythm. GI: No hepatosplenomegaly. Vascular: No leg edema. Neuro: Alert and oriented.   Lab Results:  Lab Results  Component Value Date   WBC 12.0 (H) 12/11/2021   HGB 11.0 (L) 12/11/2021   HCT 33.2 (L) 12/11/2021   MCV 102.2 (H) 12/11/2021   PLT 24 (L) 12/11/2021   NEUTROABS 6.8 12/11/2021    Imaging:  No results found.  Medications: I have reviewed the patient's current medications.  Assessment/Plan: 1.  TTP initially diagnosed in October 2018 with recurrence in April 2020 and July 2023. Initiation of daily plasma exchange and Solu-Medrol 02/02/2017 [name] Antibody- 72, activity less than 2% on initial presentation; [name] from 08/15/2018 <2.0. Last plasma exchange 02/06/2017 Prednisone 60 mg daily beginning 02/08/2017 Prednisone 40 mg daily beginning 02/17/2017 Prednisone 30 mg daily beginning 03/09/2017 Prednisone 20 mg daily beginning 03/23/2017 Prednisone 10 mg daily beginning 04/01/2017 Prednisone 5 mg daily for 5 days then 5 mg every other day for 5 doses then stop beginning 05/11/2017 Recurrent TTP 08/14/2018 Plasma exchange/steroids started on 08/15/2018.  Plasma exchange discontinued after treatment on 08/19/2019, discharged on prednisone '60mg'$  daily Prednisone  taper to 40 mg daily 08/22/2018 Weekly rituximab for 4 doses beginning 08/25/2018 Plasma exchange resumed 08/26/2018, 08/27/2018, 08/28/2018, 08/29/2018, 08/31/2018, 09/02/2018 Prednisone taper to 30 mg daily 09/01/2018 Cycle 3 weekly Rituxan 09/08/2018 Prednisone taper to 20 mg daily 09/08/2018 Cycle 4 weekly Rituxan 09/15/2018 Prednisone taper to 10 mg daily 09/20/2018 Prednisone taper to 5 mg daily 09/27/2018 Prednisone discontinued 10/07/2018 Admission with relapse of TTP 10/23/2021 Daily Plasma exchange initiated 11/23/2021 prednisone 8/1 Prednisone tapered to 40 mg daily beginning 12/04/2021 Weekly Rituxan for 4 doses beginning 12/04/2021 Discontinue plasmapheresis after appointment 12/05/2021 2. Xiphoid pain-etiology unclear 3. Hypertension 4. Diabetes 5. Alcohol/Tobacco use 6. History of renal insufficiency 7. Right IJ dialysis catheter placed 02/02/2017, removed New right IJ dialysis catheter 08/15/2018 Catheter removed 09/12/2018 New right IJ dialysis catheter placed 11/23/2021 Temporary hemodialysis catheter converted to tunneled hemodialysis catheter 11/28/2021    Disposition: Mr. Fenoglio appears stable.  He completed cycle 1 week 1 Rituxan 12/04/2021.  Last plasmapheresis 12/05/2021.  Current prednisone dose is 40 mg daily, he will continue the same dose.  Review of labs from today show platelet count 21,000, elevated bilirubin and LDH.  Plan to hold week 2 Rituxan today and reschedule to 12/16/2021.  Resume plasmapheresis 12/11/2021, 12/12/2021, 12/13/2021 and 12/15/2021.  Plan to begin [facility] with pheresis today.  Mr. Mehta agrees with this plan.  He will return for lab, follow-up, Rituxan 12/16/2021.  Patient seen with Dr. Benay Spice.    Ned Card ANP/GNP-BC   12/11/2021  10:17 AM  Addendum 12/11/2021 10:47 AM-unfortunately hemodialysis is unable to accommodate for plasmapheresis today.  We are making arrangements for hospital admission for urgent plasmapheresis.  This was a shared visit with Ned Card.  Mr. Bill was interviewed and examined.  He completed week #1 of rituximab therapy 1 week ago.  He has been maintained off of plasmapheresis since 12/05/2021.  The TTP appears to be progressing as evidenced by the fall and the platelet count, elevated bilirubin, and elevated LDH.  [id number] level from today is pending.  My plan is to resume daily plasma exchange, add Cablivi, continue weekly rituximab and prednisone.  I contacted the dialysis unit in the nephrology service.  The [facility] dialysis unit is unable to perform plasmapheresis today.  I contacted Dr. Sherral Hammers at [facility].  He graciously agreed to accept Mr. Makarewicz to the hematology service there.  He will begin plasmapheresis today.  We are available to see Mr. Simonin in follow-up as soon as Dr. Sherral Hammers feels this is appropriate.  I was present for greater than 50% of today's visit.  I performed medical decision making.  Julieanne Manson, MD

## 2021-12-11 NOTE — Progress Notes (Signed)
Contacted nurse, [place] in hemodialysis that Dr. Benay Spice needs him to have plasmapheresis 8/17, 8/18, 8/19 and 8/21 with CBC, CMP, LDH each day. Also wants to begin [place] today while in hemodialysis. Nursing will f/u to determine what time he can come in today and will call [facility]. Confirmed w/Mr. Carcione and Ms. Dixon they will have telephone close by for call. @ 1045 Notified that they can not accommodate Mr. Nudelman today. Dr. Benay Spice notified. Will require admission.

## 2021-12-11 NOTE — Progress Notes (Signed)
Patient seen by Ned Card NP today  Vitals are  Provider aware of BP elevation-- Will make PCP aware.  Labs reviewed by Ned Card NP Platelets dropped to 24,000 LDH 501 and T. Bili 2.8 Per physician team, patient will not be receiving treatment today.  Will have plasma pheresis today at [facility]. Dialysis has been notified.

## 2021-12-11 NOTE — Telephone Encounter (Signed)
TC to Pt to inform them that he will go to [place] to the ER. Gave Pt address to [facility] and informed him he would be seen by Dr Sherral Hammers.

## 2021-12-16 ENCOUNTER — Inpatient Hospital Stay: Payer: Medicaid Other

## 2021-12-16 ENCOUNTER — Inpatient Hospital Stay: Payer: Medicaid Other | Admitting: Nurse Practitioner

## 2021-12-16 LAB — ADAMTS13 ACTIVITY: Adamts 13 Activity: <2 % — CL (ref >66.8)

## 2021-12-16 LAB — ADAMTS13 ANTIBODY: ADAMTS13 Antibody: 81 Units/mL — ABNORMAL HIGH (ref <12)

## 2021-12-17 ENCOUNTER — Telehealth: Payer: Self-pay | Admitting: *Deleted

## 2021-12-17 NOTE — Telephone Encounter (Signed)
Spoke w/Yi today: still at [facility] and having pheresis daily with injection of caplacizumab daily. He is not getting Rituxan there. Platelets up to 170 today. MDs are meeting tomorrow to discuss when he can be discharged. They are able to use the same dialysis cath and it has been working well. Will call him on Friday to f/u on discharge and getting him back here.

## 2021-12-18 ENCOUNTER — Inpatient Hospital Stay: Payer: Medicaid Other

## 2021-12-19 LAB — ADAMTS13 ACTIVITY: Adamts 13 Activity: 7.8 % (ref >66.8)

## 2021-12-25 ENCOUNTER — Inpatient Hospital Stay: Payer: Medicaid Other

## 2021-12-26 ENCOUNTER — Encounter: Payer: Self-pay | Admitting: *Deleted

## 2021-12-26 ENCOUNTER — Telehealth: Payer: Self-pay

## 2021-12-26 NOTE — Progress Notes (Signed)
Dr. Benay Spice discussed case w/[facility] Provider and Eric Hooper will discharge over weekend. Needs plasmapheresis on 9/5, 9/7, 9/9, 9/11, 9/13, 9/15 with CBC,CMP and LDH with each treatment. Spoke w/Tori and hemodialysis unit and faxed request over as well. Left VM for director to f/u to ensure he is scheduled.

## 2021-12-26 NOTE — Telephone Encounter (Signed)
Laverda Sorenson called from [facility] dialysis to verify patient's orders. Per Dr. Benay Spice Plasmapheresis 9/5, 9/7, 9/9,9/11,9/13, 9/15 also draw CBC, CMP, LDH with each tx

## 2021-12-30 ENCOUNTER — Non-Acute Institutional Stay (HOSPITAL_COMMUNITY)
Admission: RE | Admit: 2021-12-30 | Discharge: 2021-12-30 | Disposition: A | Discharge status: Home / Self Care | Payer: Medicaid Other | Attending: Oncology | Admitting: Oncology

## 2021-12-30 DIAGNOSIS — M3119 Other thrombotic microangiopathy: Secondary | ICD-10-CM | POA: Diagnosis present

## 2021-12-30 LAB — CBC
HCT: 36.9 % — ABNORMAL LOW (ref 39.0–52.0)
Hemoglobin: 12.1 g/dL — ABNORMAL LOW (ref 13.0–17.0)
MCH: 33.6 pg (ref 26.0–34.0)
MCHC: 32.8 g/dL (ref 30.0–36.0)
MCV: 102.5 fL — ABNORMAL HIGH (ref 80.0–100.0)
Platelets: 246 10*3/uL (ref 150–400)
RBC: 3.6 MIL/uL — ABNORMAL LOW (ref 4.22–5.81)
RDW: 12.9 % (ref 11.5–15.5)
WBC: 9.1 10*3/uL (ref 4.0–10.5)
nRBC: 0 % (ref 0.0–0.2)

## 2021-12-30 LAB — COMPREHENSIVE METABOLIC PANEL
ALT: 44 U/L (ref 0–44)
AST: 16 U/L (ref 15–41)
Albumin: 3.1 g/dL — ABNORMAL LOW (ref 3.5–5.0)
Alkaline Phosphatase: 46 U/L (ref 38–126)
Anion gap: 7 (ref 5–15)
BUN: 20 mg/dL (ref 6–20)
CO2: 27 mmol/L (ref 22–32)
Calcium: 9 mg/dL (ref 8.9–10.3)
Chloride: 107 mmol/L (ref 98–111)
Creatinine, Ser: 0.94 mg/dL (ref 0.61–1.24)
GFR, Estimated: >60 mL/min (ref >60)
Glucose, Bld: 181 mg/dL — ABNORMAL HIGH (ref 70–99)
Potassium: 3.3 mmol/L — ABNORMAL LOW (ref 3.5–5.1)
Sodium: 141 mmol/L (ref 135–145)
Total Bilirubin: 0.3 mg/dL (ref 0.3–1.2)
Total Protein: 5.2 g/dL — ABNORMAL LOW (ref 6.5–8.1)

## 2021-12-30 LAB — LACTATE DEHYDROGENASE: LDH: 146 U/L (ref 98–192)

## 2021-12-30 MED ORDER — ACD FORMULA A 0.73-2.45-2.2 GM/100ML VI SOLN
Status: AC
  Filled 2021-12-30: qty 1000

## 2021-12-30 MED ORDER — ANTICOAGULANT SODIUM CITRATE 4% (200MG/5ML) IV SOLN
5.0000 mL | Freq: Once | Status: AC
  Administered 2021-12-30: 5 mL
  Filled 2021-12-30: qty 5

## 2021-12-30 MED ORDER — CALCIUM CARBONATE ANTACID 500 MG PO CHEW
CHEWABLE_TABLET | ORAL | Status: AC
  Filled 2021-12-30: qty 2

## 2021-12-30 MED ORDER — CALCIUM GLUCONATE-NACL 2-0.675 GM/100ML-% IV SOLN
2.0000 g | Freq: Once | INTRAVENOUS | Status: AC
  Administered 2021-12-30: 2000 mg via INTRAVENOUS

## 2021-12-30 MED ORDER — CALCIUM CARBONATE ANTACID 500 MG PO CHEW
2.0000 | CHEWABLE_TABLET | ORAL | Status: DC
  Administered 2021-12-30: 400 mg via ORAL

## 2021-12-30 MED ORDER — ACETAMINOPHEN 325 MG PO TABS
650.0000 mg | ORAL_TABLET | ORAL | Status: DC | PRN
Start: 2021-12-30 — End: 2021-12-30

## 2021-12-30 MED ORDER — DIPHENHYDRAMINE HCL 25 MG PO CAPS: 25.0000 mg | ORAL_CAPSULE | Freq: Four times a day (QID) | ORAL | Status: DC | PRN

## 2021-12-30 MED ORDER — ACD FORMULA A 0.73-2.45-2.2 GM/100ML VI SOLN
1000.0000 mL | Status: DC
  Administered 2021-12-30: 1000 mL

## 2021-12-30 NOTE — Progress Notes (Signed)
TPE tx complete without adverse events.  VSS post treatment and patient without complaint.  Patient being discharged home with self care.  Patient aware of next exchange scheduled for Thursday, 9/7.

## 2021-12-31 ENCOUNTER — Other Ambulatory Visit: Payer: Self-pay | Admitting: Oncology

## 2021-12-31 ENCOUNTER — Inpatient Hospital Stay: Payer: PRIVATE HEALTH INSURANCE

## 2021-12-31 ENCOUNTER — Inpatient Hospital Stay (HOSPITAL_BASED_OUTPATIENT_CLINIC_OR_DEPARTMENT_OTHER): Payer: PRIVATE HEALTH INSURANCE | Admitting: Nurse Practitioner

## 2021-12-31 ENCOUNTER — Inpatient Hospital Stay: Payer: PRIVATE HEALTH INSURANCE | Attending: Oncology

## 2021-12-31 VITALS — BP 130/85 | HR 70 | Temp 98.2°F | Resp 18 | Wt 196.0 lb

## 2021-12-31 VITALS — BP 131/91 | HR 61 | Temp 98.2°F | Resp 18 | Ht 70.0 in | Wt 196.0 lb

## 2021-12-31 DIAGNOSIS — Z79899 Other long term (current) drug therapy: Secondary | ICD-10-CM | POA: Insufficient documentation

## 2021-12-31 DIAGNOSIS — M3119 Other thrombotic microangiopathy: Secondary | ICD-10-CM

## 2021-12-31 DIAGNOSIS — Z7952 Long term (current) use of systemic steroids: Secondary | ICD-10-CM | POA: Diagnosis not present

## 2021-12-31 DIAGNOSIS — R21 Rash and other nonspecific skin eruption: Secondary | ICD-10-CM | POA: Insufficient documentation

## 2021-12-31 LAB — THERAPEUTIC PLASMA EXCHANGE (BLOOD BANK)
Plasma Exchange: 3486
Plasma volume needed: 3486
Unit division: 0
Unit division: 0
Unit division: 0
Unit division: 0
Unit division: 0
Unit division: 0
Unit division: 0
Unit division: 0
Unit division: 0
Unit division: 0

## 2021-12-31 LAB — CBC WITH DIFFERENTIAL (CANCER CENTER ONLY)
Abs Immature Granulocytes: 0.02 10*3/uL (ref 0.00–0.07)
Basophils Absolute: 0 10*3/uL (ref 0.0–0.1)
Basophils Relative: 0 %
Eosinophils Absolute: 0.1 10*3/uL (ref 0.0–0.5)
Eosinophils Relative: 1 %
HCT: 38 % — ABNORMAL LOW (ref 39.0–52.0)
Hemoglobin: 12.7 g/dL — ABNORMAL LOW (ref 13.0–17.0)
Immature Granulocytes: 0 %
Lymphocytes Relative: 29 %
Lymphs Abs: 2.4 10*3/uL (ref 0.7–4.0)
MCH: 33.2 pg (ref 26.0–34.0)
MCHC: 33.4 g/dL (ref 30.0–36.0)
MCV: 99.5 fL (ref 80.0–100.0)
Monocytes Absolute: 0.5 10*3/uL (ref 0.1–1.0)
Monocytes Relative: 6 %
Neutro Abs: 5.3 10*3/uL (ref 1.7–7.7)
Neutrophils Relative %: 64 %
Platelet Count: 237 10*3/uL (ref 150–400)
RBC: 3.82 MIL/uL — ABNORMAL LOW (ref 4.22–5.81)
RDW: 13.1 % (ref 11.5–15.5)
WBC Count: 8.3 10*3/uL (ref 4.0–10.5)
nRBC: 0 % (ref 0.0–0.2)

## 2021-12-31 LAB — CMP (CANCER CENTER ONLY)
ALT: 32 U/L (ref 0–44)
AST: 15 U/L (ref 15–41)
Albumin: 3.6 g/dL (ref 3.5–5.0)
Alkaline Phosphatase: 47 U/L (ref 38–126)
Anion gap: 8 (ref 5–15)
BUN: 20 mg/dL (ref 6–20)
CO2: 27 mmol/L (ref 22–32)
Calcium: 8.9 mg/dL (ref 8.9–10.3)
Chloride: 106 mmol/L (ref 98–111)
Creatinine: 0.86 mg/dL (ref 0.61–1.24)
GFR, Estimated: >60 mL/min (ref >60)
Glucose, Bld: 209 mg/dL — ABNORMAL HIGH (ref 70–99)
Potassium: 3.6 mmol/L (ref 3.5–5.1)
Sodium: 141 mmol/L (ref 135–145)
Total Bilirubin: 0.5 mg/dL (ref 0.3–1.2)
Total Protein: 5.8 g/dL — ABNORMAL LOW (ref 6.5–8.1)

## 2021-12-31 LAB — LACTATE DEHYDROGENASE: LDH: 143 U/L (ref 98–192)

## 2021-12-31 MED ORDER — SODIUM CHLORIDE 0.9 % IV SOLN
375.0000 mg/m2 | Freq: Once | INTRAVENOUS | Status: AC
  Administered 2021-12-31: 800 mg via INTRAVENOUS
  Filled 2021-12-31: qty 30

## 2021-12-31 MED ORDER — FAMOTIDINE IN NACL 20-0.9 MG/50ML-% IV SOLN
20.0000 mg | Freq: Once | INTRAVENOUS | Status: AC
  Administered 2021-12-31: 20 mg via INTRAVENOUS
  Filled 2021-12-31: qty 50

## 2021-12-31 MED ORDER — SODIUM CHLORIDE 0.9 % IV SOLN: Freq: Once | INTRAVENOUS | Status: AC

## 2021-12-31 MED ORDER — DIPHENHYDRAMINE HCL 25 MG PO CAPS
50.0000 mg | ORAL_CAPSULE | Freq: Once | ORAL | Status: AC
  Administered 2021-12-31: 50 mg via ORAL
  Filled 2021-12-31: qty 2

## 2021-12-31 MED ORDER — ACETAMINOPHEN 325 MG PO TABS
650.0000 mg | ORAL_TABLET | Freq: Once | ORAL | Status: AC
  Administered 2021-12-31: 650 mg via ORAL
  Filled 2021-12-31: qty 2

## 2021-12-31 NOTE — Patient Instructions (Signed)
[facility]   Discharge Instructions: Thank you for choosing [facility] to provide your oncology and hematology care.   If you have a lab appointment with the [facility], please go directly to the [facility] and check in at the registration area.   Wear comfortable clothing and clothing appropriate for easy access to any Portacath or PICC line.   We strive to give you quality time with your provider. You may need to reschedule your appointment if you arrive late (15 or more minutes).  Arriving late affects you and other patients whose appointments are after yours.  Also, if you miss three or more appointments without notifying the office, you may be dismissed from the clinic at the provider's discretion.      For prescription refill requests, have your pharmacy contact our office and allow 72 hours for refills to be completed.    Today you received the following chemotherapy and/or immunotherapy agents Rituximab.      To help prevent nausea and vomiting after your treatment, we encourage you to take your nausea medication as directed.  BELOW ARE SYMPTOMS THAT SHOULD BE REPORTED IMMEDIATELY: *FEVER GREATER THAN 100.4 F (38 C) OR HIGHER *CHILLS OR SWEATING *NAUSEA AND VOMITING THAT IS NOT CONTROLLED WITH YOUR NAUSEA MEDICATION *UNUSUAL SHORTNESS OF BREATH *UNUSUAL BRUISING OR BLEEDING *URINARY PROBLEMS (pain or burning when urinating, or frequent urination) *BOWEL PROBLEMS (unusual diarrhea, constipation, pain near the anus) TENDERNESS IN MOUTH AND THROAT WITH OR WITHOUT PRESENCE OF ULCERS (sore throat, sores in mouth, or a toothache) UNUSUAL RASH, SWELLING OR PAIN  UNUSUAL VAGINAL DISCHARGE OR ITCHING   Items with * indicate a potential emergency and should be followed up as soon as possible or go to the Emergency Department if any problems should occur.  Please show the CHEMOTHERAPY ALERT CARD or IMMUNOTHERAPY ALERT CARD at check-in to  the Emergency Department and triage nurse.  Should you have questions after your visit or need to cancel or reschedule your appointment, please contact [facility]  Dept: [phone]  and follow the prompts.  Office hours are 8:00 a.m. to 4:30 p.m. Monday - Friday. Please note that voicemails left after 4:00 p.m. may not be returned until the following business day.  We are closed weekends and major holidays. You have access to a nurse at all times for urgent questions. Please call the main number to the clinic Dept: [phone] and follow the prompts.   For any non-urgent questions, you may also contact your provider using MyChart. We now offer e-Visits for anyone 67 and older to request care online for non-urgent symptoms. For details visit [email or web address].   Also download the MyChart app! Go to the app store, search "MyChart", open the app, select [facility], and log in with your MyChart username and password.  Masks are optional in the cancer centers. If you would like for your care team to wear a mask while they are taking care of you, please let them know. You may have one support person who is at least 48 years old accompany you for your appointments.  Rituximab Injection What is this medication? RITUXIMAB (ri TUX i mab) treats leukemia and lymphoma. It works by blocking a protein that causes cancer cells to grow and multiply. This helps to slow or stop the spread of cancer cells. It may also be used to treat autoimmune conditions, such as arthritis. It works by slowing down an overactive immune  system. It is a monoclonal antibody. This medicine may be used for other purposes; ask your health care provider or pharmacist if you have questions. COMMON BRAND NAME(S): RIABNI, Rituxan, RUXIENCE, truxima What should I tell my care team before I take this medication? They need to know if you have any of these conditions: Chest pain Heart disease Immune system  problems Infection, such as chickenpox, cold sores, hepatitis B, herpes Irregular heartbeat or rhythm Kidney disease Low blood counts, such as low white cells, platelets, red cells Lung disease Recent or upcoming vaccine An unusual or allergic reaction to rituximab, other medications, foods, dyes, or preservatives Pregnant or trying to get pregnant Breast-feeding How should I use this medication? This medication is injected into a vein. It is given by a care team in a hospital or clinic setting. A special MedGuide will be given to you before each treatment. Be sure to read this information carefully each time. Talk to your care team about the use of this medication in children. While this medication may be prescribed for children as young as 6 months for selected conditions, precautions do apply. Overdosage: If you think you have taken too much of this medicine contact a poison control center or emergency room at once. NOTE: This medicine is only for you. Do not share this medicine with others. What if I miss a dose? Keep appointments for follow-up doses. It is important not to miss your dose. Call your care team if you are unable to keep an appointment. What may interact with this medication? Do not take this medication with any of the following: Live vaccines This medication may also interact with the following: Cisplatin This list may not describe all possible interactions. Give your health care provider a list of all the medicines, herbs, non-prescription drugs, or dietary supplements you use. Also tell them if you smoke, drink alcohol, or use illegal drugs. Some items may interact with your medicine. What should I watch for while using this medication? Your condition will be monitored carefully while you are receiving this medication. You may need blood work while taking this medication. This medication can cause serious infusion reactions. To reduce the risk your care team may give  you other medications to take before receiving this one. Be sure to follow the directions from your care team. This medication may increase your risk of getting an infection. Call your care team for advice if you get a fever, chills, sore throat, or other symptoms of a cold or flu. Do not treat yourself. Try to avoid being around people who are sick. Call your care team if you are around anyone with measles, chickenpox, or if you develop sores or blisters that do not heal properly. Avoid taking medications that contain aspirin, acetaminophen, ibuprofen, naproxen, or ketoprofen unless instructed by your care team. These medications may hide a fever. This medication may cause serious skin reactions. They can happen weeks to months after starting the medication. Contact your care team right away if you notice fevers or flu-like symptoms with a rash. The rash may be red or purple and then turn into blisters or peeling of the skin. You may also notice a red rash with swelling of the face, lips, or lymph nodes in your neck or under your arms. In some patients, this medication may cause a serious brain infection that may cause death. If you have any problems seeing, thinking, speaking, walking, or standing, tell your care team right away. If you cannot reach  your care team, urgently seek another source of medical care. Talk to your care team if you may be pregnant. Serious birth defects can occur if you take this medication during pregnancy and for 12 months after the last dose. You will need a negative pregnancy test before starting this medication. Contraception is recommended while taking this medication and for 12 months after the last dose. Your care team can help you find the option that works for you. Do not breastfeed while taking this medication and for at least 6 months after the last dose. What side effects may I notice from receiving this medication? Side effects that you should report to your care  team as soon as possible: Allergic reactions or angioedema--skin rash, itching or hives, swelling of the face, eyes, lips, tongue, arms, or legs, trouble swallowing or breathing Bowel blockage--stomach cramping, unable to have a bowel movement or pass gas, loss of appetite, vomiting Dizziness, loss of balance or coordination, confusion or trouble speaking Heart attack--pain or tightness in the chest, shoulders, arms, or jaw, nausea, shortness of breath, cold or clammy skin, feeling faint or lightheaded Heart rhythm changes--fast or irregular heartbeat, dizziness, feeling faint or lightheaded, chest pain, trouble breathing Infection--fever, chills, cough, sore throat, wounds that don't heal, pain or trouble when passing urine, general feeling of discomfort or being unwell Infusion reactions--chest pain, shortness of breath or trouble breathing, feeling faint or lightheaded Kidney injury--decrease in the amount of urine, swelling of the ankles, hands, or feet Liver injury--right upper belly pain, loss of appetite, nausea, light-colored stool, dark yellow or brown urine, yellowing skin or eyes, unusual weakness or fatigue Redness, blistering, peeling, or loosening of the skin, including inside the mouth Stomach pain that is severe, does not go away, or gets worse Tumor lysis syndrome (TLS)--nausea, vomiting, diarrhea, decrease in the amount of urine, dark urine, unusual weakness or fatigue, confusion, muscle pain or cramps, fast or irregular heartbeat, joint pain Side effects that usually do not require medical attention (report to your care team if they continue or are bothersome): Headache Joint pain Nausea Runny or stuffy nose Unusual weakness or fatigue This list may not describe all possible side effects. Call your doctor for medical advice about side effects. You may report side effects to FDA at 1-800-FDA-1088. Where should I keep my medication? This medication is given in a hospital or  clinic. It will not be stored at home. NOTE: This sheet is a summary. It may not cover all possible information. If you have questions about this medicine, talk to your doctor, pharmacist, or health care provider.  2023 Elsevier/Gold Standard (2021-09-01 00:00:00)

## 2021-12-31 NOTE — Progress Notes (Signed)
Ned Card, NP nurse notified that pt here for treatment. Complaining of rash on face, neck and shoulders.  First noted while in the hospital and continues. It seems to dry up in one place and another place.

## 2021-12-31 NOTE — Progress Notes (Signed)
[facility] OFFICE PROGRESS NOTE   Diagnosis: TTP  INTERVAL HISTORY:   Eric Hooper returns as scheduled.  He had plasmapheresis 12/30/2021.  He denies bleeding.  He notes a rash on his face, neck and upper back.  Occasional mild headache.  He continues [place], start date 12/12/2021.  Prednisone dose is 80 mg daily.  Objective:  Vital signs in last 24 hours:  Temperature 98.2, heart rate 70, respirations 18, blood pressure 129/89, oxygen saturation 100%   Resp: Lungs clear bilaterally. Cardio: Regular rate and rhythm. GI: No hepatosplenomegaly. Vascular: No leg edema. Skin: Acne type rash face, neck and upper chest/back. Right chest catheter without erythema.  Lab Results:  Lab Results  Component Value Date   WBC 8.3 12/31/2021   HGB 12.7 (L) 12/31/2021   HCT 38.0 (L) 12/31/2021   MCV 99.5 12/31/2021   PLT 237 12/31/2021   NEUTROABS 5.3 12/31/2021    Imaging:  No results found.  Medications: I have reviewed the patient's current medications.  Assessment/Plan: 1.  TTP initially diagnosed in October 2018 with recurrence in April 2020 and July 2023. Initiation of daily plasma exchange and Solu-Medrol 02/02/2017 [name] Antibody- 72, activity less than 2% on initial presentation; [name] from 08/15/2018 <2.0. Last plasma exchange 02/06/2017 Prednisone 60 mg daily beginning 02/08/2017 Prednisone 40 mg daily beginning 02/17/2017 Prednisone 30 mg daily beginning 03/09/2017 Prednisone 20 mg daily beginning 03/23/2017 Prednisone 10 mg daily beginning 04/01/2017 Prednisone 5 mg daily for 5 days then 5 mg every other day for 5 doses then stop beginning 05/11/2017 Recurrent TTP 08/14/2018 Plasma exchange/steroids started on 08/15/2018.  Plasma exchange discontinued after treatment on 08/19/2019, discharged on prednisone '60mg'$  daily Prednisone taper to 40 mg daily 08/22/2018 Weekly rituximab for 4 doses beginning 08/25/2018 Plasma exchange resumed 08/26/2018, 08/27/2018,  08/28/2018, 08/29/2018, 08/31/2018, 09/02/2018 Prednisone taper to 30 mg daily 09/01/2018 Cycle 3 weekly Rituxan 09/08/2018 Prednisone taper to 20 mg daily 09/08/2018 Cycle 4 weekly Rituxan 09/15/2018 Prednisone taper to 10 mg daily 09/20/2018 Prednisone taper to 5 mg daily 09/27/2018 Prednisone discontinued 10/07/2018 Admission with relapse of TTP 10/23/2021 Daily Plasma exchange initiated 11/23/2021 prednisone 8/1 Prednisone tapered to 40 mg daily beginning 12/04/2021 Weekly Rituxan for 4 doses beginning 12/04/2021 Discontinue plasmapheresis after appointment 12/05/2021 Admitted to [facility] 12/11/2021-plasmapheresis reinitiated, Cablivi started 12/12/2021, prednisone dose currently 80 mg daily Every other day plasma exchange beginning 12/28/2021 Second weekly Rituxan 12/31/2021 2. Xiphoid pain-etiology unclear 3. Hypertension 4. Diabetes 5. Alcohol/Tobacco use 6. History of renal insufficiency 7. Right IJ dialysis catheter placed 02/02/2017, removed New right IJ dialysis catheter 08/15/2018 Catheter removed 09/12/2018 New right IJ dialysis catheter placed 11/23/2021 Temporary hemodialysis catheter converted to tunneled hemodialysis catheter 11/28/2021    Disposition: Eric Hooper appears stable.  Labs from today reviewed.  Platelet count remains in normal range, LDH normal.  He continues daily Cablivi.  He will taper prednisone to 60 mg daily (plan to taper to 40 mg daily next week if labs remain in adequate range); plasmapheresis done on 12/30/2021, plan 01/01/2022, 01/03/2022, 01/05/2022, 01/07/2022, 01/09/2022.  He is completing the second weekly Rituxan today.  He will return for lab, follow-up, week 3 Rituxan 01/06/2022.  Patient seen with Dr. Benay Spice.    Ned Card ANP/GNP-BC   12/31/2021  1:17 PM This was a shared visit with Ned Card.  Eric Hooper was interviewed and examined.  He was admitted to [facility] for treatment of active TTP on 12/11/2021.  He was maintained on steroids.  Completely was added.  He  appears to be tolerating the treatment well.  I discussed the case with Dr. Einar Gip.  The plan is to continue every other day plasma exchange here, prednisone, and complete the 4-week course of rituximab.  He will complete every other day plasma exchange through 01/09/2022.  We will discontinue plasma exchange when the [name] level returns normal.  He will begin a prednisone taper.  I was present for greater than 50% of today's visit.  I performed medical decision making.  Julieanne Manson, MD

## 2022-01-01 ENCOUNTER — Non-Acute Institutional Stay (HOSPITAL_COMMUNITY)
Admission: RE | Admit: 2022-01-01 | Discharge: 2022-01-01 | Disposition: A | Discharge status: Home / Self Care | Payer: Medicaid Other | Source: Ambulatory Visit | Attending: Nephrology | Admitting: Nephrology

## 2022-01-01 DIAGNOSIS — M3119 Other thrombotic microangiopathy: Secondary | ICD-10-CM | POA: Insufficient documentation

## 2022-01-01 DIAGNOSIS — D696 Thrombocytopenia, unspecified: Secondary | ICD-10-CM

## 2022-01-01 LAB — CBC WITH DIFFERENTIAL/PLATELET
Abs Immature Granulocytes: 0.04 10*3/uL (ref 0.00–0.07)
Basophils Absolute: 0 10*3/uL (ref 0.0–0.1)
Basophils Relative: 0 %
Eosinophils Absolute: 0.1 10*3/uL (ref 0.0–0.5)
Eosinophils Relative: 1 %
HCT: 39 % (ref 39.0–52.0)
Hemoglobin: 12.6 g/dL — ABNORMAL LOW (ref 13.0–17.0)
Immature Granulocytes: 0 %
Lymphocytes Relative: 20 %
Lymphs Abs: 2.1 10*3/uL (ref 0.7–4.0)
MCH: 33.3 pg (ref 26.0–34.0)
MCHC: 32.3 g/dL (ref 30.0–36.0)
MCV: 103.2 fL — ABNORMAL HIGH (ref 80.0–100.0)
Monocytes Absolute: 0.7 10*3/uL (ref 0.1–1.0)
Monocytes Relative: 7 %
Neutro Abs: 7.5 10*3/uL (ref 1.7–7.7)
Neutrophils Relative %: 72 %
Platelets: 246 10*3/uL (ref 150–400)
RBC: 3.78 MIL/uL — ABNORMAL LOW (ref 4.22–5.81)
RDW: 12.9 % (ref 11.5–15.5)
WBC: 10.4 10*3/uL (ref 4.0–10.5)
nRBC: 0 % (ref 0.0–0.2)

## 2022-01-01 LAB — BASIC METABOLIC PANEL
Anion gap: 4 — ABNORMAL LOW (ref 5–15)
BUN: 15 mg/dL (ref 6–20)
CO2: 26 mmol/L (ref 22–32)
Calcium: 8.5 mg/dL — ABNORMAL LOW (ref 8.9–10.3)
Chloride: 112 mmol/L — ABNORMAL HIGH (ref 98–111)
Creatinine, Ser: 0.92 mg/dL (ref 0.61–1.24)
GFR, Estimated: >60 mL/min (ref >60)
Glucose, Bld: 188 mg/dL — ABNORMAL HIGH (ref 70–99)
Potassium: 3.6 mmol/L (ref 3.5–5.1)
Sodium: 142 mmol/L (ref 135–145)

## 2022-01-01 MED ORDER — ACETAMINOPHEN 325 MG PO TABS
ORAL_TABLET | ORAL | Status: AC
  Filled 2022-01-01: qty 2

## 2022-01-01 MED ORDER — ACD FORMULA A 0.73-2.45-2.2 GM/100ML VI SOLN
Status: AC
  Filled 2022-01-01: qty 1000

## 2022-01-01 MED ORDER — CALCIUM CARBONATE ANTACID 500 MG PO CHEW
2.0000 | CHEWABLE_TABLET | ORAL | Status: DC
  Administered 2022-01-01: 400 mg via ORAL

## 2022-01-01 MED ORDER — CALCIUM GLUCONATE-NACL 2-0.675 GM/100ML-% IV SOLN
2.0000 g | Freq: Once | INTRAVENOUS | Status: AC
  Administered 2022-01-01: 2000 mg via INTRAVENOUS

## 2022-01-01 MED ORDER — ACETAMINOPHEN 325 MG PO TABS
650.0000 mg | ORAL_TABLET | ORAL | Status: DC | PRN
Start: 2022-01-01 — End: 2022-01-01
  Administered 2022-01-01: 650 mg via ORAL

## 2022-01-01 MED ORDER — CALCIUM CARBONATE ANTACID 500 MG PO CHEW
CHEWABLE_TABLET | ORAL | Status: DC
Start: 2022-01-01 — End: 2022-01-01
  Filled 2022-01-01: qty 2

## 2022-01-01 MED ORDER — ACD FORMULA A 0.73-2.45-2.2 GM/100ML VI SOLN
1000.0000 mL | Status: DC
  Administered 2022-01-01: 1000 mL

## 2022-01-01 MED ORDER — CALCIUM GLUCONATE-NACL 2-0.675 GM/100ML-% IV SOLN
INTRAVENOUS | Status: AC
  Filled 2022-01-01: qty 100

## 2022-01-01 MED ORDER — ANTICOAGULANT SODIUM CITRATE 4% (200MG/5ML) IV SOLN
5.0000 mL | Freq: Once | Status: AC
  Administered 2022-01-01: 5 mL
  Filled 2022-01-01 (×2): qty 5

## 2022-01-01 MED ORDER — DIPHENHYDRAMINE HCL 25 MG PO CAPS
ORAL_CAPSULE | ORAL | Status: AC
  Administered 2022-01-01: 25 mg
  Filled 2022-01-01: qty 1

## 2022-01-01 MED ORDER — DIPHENHYDRAMINE HCL 25 MG PO CAPS: 25.0000 mg | ORAL_CAPSULE | Freq: Four times a day (QID) | ORAL | Status: DC | PRN

## 2022-01-01 NOTE — Progress Notes (Signed)
TPE tx complete without adverse events, VSS post treatment and patient without complaint. Patient being discharged home self care. Patient aware of next exchange scheduled for Saturday, 9/9.

## 2022-01-02 LAB — THERAPEUTIC PLASMA EXCHANGE (BLOOD BANK)
Plasma volume needed: 3486
Unit division: 0
Unit division: 0
Unit division: 0
Unit division: 0
Unit division: 0
Unit division: 0
Unit division: 0
Unit division: 0

## 2022-01-03 ENCOUNTER — Non-Acute Institutional Stay (HOSPITAL_COMMUNITY)
Admission: RE | Admit: 2022-01-03 | Discharge: 2022-01-03 | Disposition: A | Discharge status: Home / Self Care | Payer: Medicaid Other | Attending: Oncology | Admitting: Oncology

## 2022-01-03 DIAGNOSIS — M3119 Other thrombotic microangiopathy: Secondary | ICD-10-CM | POA: Insufficient documentation

## 2022-01-03 LAB — CBC
HCT: 39.5 % (ref 39.0–52.0)
Hemoglobin: 13 g/dL (ref 13.0–17.0)
MCH: 33.5 pg (ref 26.0–34.0)
MCHC: 32.9 g/dL (ref 30.0–36.0)
MCV: 101.8 fL — ABNORMAL HIGH (ref 80.0–100.0)
Platelets: 217 10*3/uL (ref 150–400)
RBC: 3.88 MIL/uL — ABNORMAL LOW (ref 4.22–5.81)
RDW: 13 % (ref 11.5–15.5)
WBC: 9.8 10*3/uL (ref 4.0–10.5)
nRBC: 0 % (ref 0.0–0.2)

## 2022-01-03 LAB — COMPREHENSIVE METABOLIC PANEL
ALT: 36 U/L (ref 0–44)
AST: 19 U/L (ref 15–41)
Albumin: 3.4 g/dL — ABNORMAL LOW (ref 3.5–5.0)
Alkaline Phosphatase: 44 U/L (ref 38–126)
Anion gap: 7 (ref 5–15)
BUN: 20 mg/dL (ref 6–20)
CO2: 27 mmol/L (ref 22–32)
Calcium: 9.9 mg/dL (ref 8.9–10.3)
Chloride: 106 mmol/L (ref 98–111)
Creatinine, Ser: 0.89 mg/dL (ref 0.61–1.24)
GFR, Estimated: >60 mL/min (ref >60)
Glucose, Bld: 250 mg/dL — ABNORMAL HIGH (ref 70–99)
Potassium: 4.2 mmol/L (ref 3.5–5.1)
Sodium: 140 mmol/L (ref 135–145)
Total Bilirubin: 0.4 mg/dL (ref 0.3–1.2)
Total Protein: 5.8 g/dL — ABNORMAL LOW (ref 6.5–8.1)

## 2022-01-03 LAB — LACTATE DEHYDROGENASE: LDH: 139 U/L (ref 98–192)

## 2022-01-03 MED ORDER — CALCIUM CARBONATE ANTACID 500 MG PO CHEW
2.0000 | CHEWABLE_TABLET | ORAL | Status: DC
  Administered 2022-01-03: 400 mg via ORAL
  Filled 2022-01-03: qty 2

## 2022-01-03 MED ORDER — DIPHENHYDRAMINE HCL 25 MG PO CAPS
25.0000 mg | ORAL_CAPSULE | Freq: Four times a day (QID) | ORAL | Status: DC | PRN
  Administered 2022-01-03: 25 mg via ORAL
  Filled 2022-01-03: qty 1

## 2022-01-03 MED ORDER — ACD FORMULA A 0.73-2.45-2.2 GM/100ML VI SOLN
Status: AC
  Filled 2022-01-03: qty 1000

## 2022-01-03 MED ORDER — ACD FORMULA A 0.73-2.45-2.2 GM/100ML VI SOLN
1000.0000 mL | Status: DC
  Administered 2022-01-03: 1000 mL

## 2022-01-03 MED ORDER — CALCIUM GLUCONATE-NACL 2-0.675 GM/100ML-% IV SOLN
2.0000 g | Freq: Once | INTRAVENOUS | Status: AC
  Administered 2022-01-03: 2000 mg via INTRAVENOUS

## 2022-01-03 MED ORDER — ANTICOAGULANT SODIUM CITRATE 4% (200MG/5ML) IV SOLN
5.0000 mL | Freq: Once | Status: AC
  Administered 2022-01-03: 5 mL
  Filled 2022-01-03: qty 5

## 2022-01-03 MED ORDER — ACETAMINOPHEN 325 MG PO TABS
650.0000 mg | ORAL_TABLET | ORAL | Status: DC | PRN
Start: 2022-01-03 — End: 2022-01-03

## 2022-01-03 NOTE — Progress Notes (Signed)
Treatment complete without complications.  VSS post treatment and patient without complaint.  Patient discharged to home with self care.  Patient aware of next plasma exchange scheduled for Monday, 9/11.

## 2022-01-04 LAB — THERAPEUTIC PLASMA EXCHANGE (BLOOD BANK)
Plasma Exchange: 3486
Plasma volume needed: 3486
Unit division: 0
Unit division: 0
Unit division: 0
Unit division: 0
Unit division: 0
Unit division: 0
Unit division: 0
Unit division: 0
Unit division: 0
Unit division: 0
Unit division: 0

## 2022-01-05 ENCOUNTER — Non-Acute Institutional Stay (HOSPITAL_COMMUNITY)
Admission: AD | Admit: 2022-01-05 | Discharge: 2022-01-05 | Disposition: A | Discharge status: Home / Self Care | Payer: Medicaid Other | Source: Ambulatory Visit | Attending: Oncology | Admitting: Oncology

## 2022-01-05 DIAGNOSIS — M3119 Other thrombotic microangiopathy: Secondary | ICD-10-CM | POA: Diagnosis present

## 2022-01-05 DIAGNOSIS — D696 Thrombocytopenia, unspecified: Secondary | ICD-10-CM | POA: Insufficient documentation

## 2022-01-05 LAB — COMPREHENSIVE METABOLIC PANEL
ALT: 37 U/L (ref 0–44)
AST: 21 U/L (ref 15–41)
Albumin: 3.4 g/dL — ABNORMAL LOW (ref 3.5–5.0)
Alkaline Phosphatase: 48 U/L (ref 38–126)
Anion gap: 9 (ref 5–15)
BUN: 16 mg/dL (ref 6–20)
CO2: 26 mmol/L (ref 22–32)
Calcium: 10.1 mg/dL (ref 8.9–10.3)
Chloride: 107 mmol/L (ref 98–111)
Creatinine, Ser: 0.89 mg/dL (ref 0.61–1.24)
GFR, Estimated: >60 mL/min (ref >60)
Glucose, Bld: 167 mg/dL — ABNORMAL HIGH (ref 70–99)
Potassium: 4.3 mmol/L (ref 3.5–5.1)
Sodium: 142 mmol/L (ref 135–145)
Total Bilirubin: 0.6 mg/dL (ref 0.3–1.2)
Total Protein: 5.9 g/dL — ABNORMAL LOW (ref 6.5–8.1)

## 2022-01-05 LAB — CBC
HCT: 39.2 % (ref 39.0–52.0)
Hemoglobin: 13.2 g/dL (ref 13.0–17.0)
MCH: 34.2 pg — ABNORMAL HIGH (ref 26.0–34.0)
MCHC: 33.7 g/dL (ref 30.0–36.0)
MCV: 101.6 fL — ABNORMAL HIGH (ref 80.0–100.0)
Platelets: 216 10*3/uL (ref 150–400)
RBC: 3.86 MIL/uL — ABNORMAL LOW (ref 4.22–5.81)
RDW: 12.7 % (ref 11.5–15.5)
WBC: 8.7 10*3/uL (ref 4.0–10.5)
nRBC: 0 % (ref 0.0–0.2)

## 2022-01-05 LAB — LACTATE DEHYDROGENASE: LDH: 172 U/L (ref 98–192)

## 2022-01-05 MED ORDER — ACD FORMULA A 0.73-2.45-2.2 GM/100ML VI SOLN
Status: AC
  Filled 2022-01-05: qty 1000

## 2022-01-05 MED ORDER — CALCIUM GLUCONATE-NACL 2-0.675 GM/100ML-% IV SOLN
INTRAVENOUS | Status: AC
  Administered 2022-01-05: 2000 mg
  Filled 2022-01-05: qty 100

## 2022-01-05 MED ORDER — CALCIUM CARBONATE ANTACID 500 MG PO CHEW
2.0000 | CHEWABLE_TABLET | ORAL | Status: DC
  Administered 2022-01-05: 400 mg via ORAL
  Filled 2022-01-05: qty 2

## 2022-01-05 MED ORDER — ACD FORMULA A 0.73-2.45-2.2 GM/100ML VI SOLN
1000.0000 mL | Status: DC
  Administered 2022-01-05: 1000 mL

## 2022-01-05 MED ORDER — CALCIUM GLUCONATE-NACL 2-0.675 GM/100ML-% IV SOLN: 2.0000 g | Freq: Once | INTRAVENOUS | Status: DC

## 2022-01-05 MED ORDER — ANTICOAGULANT SODIUM CITRATE 4% (200MG/5ML) IV SOLN
5.0000 mL | Freq: Once | Status: AC
  Administered 2022-01-05: 5 mL
  Filled 2022-01-05 (×2): qty 5

## 2022-01-05 MED ORDER — ACETAMINOPHEN 325 MG PO TABS
650.0000 mg | ORAL_TABLET | ORAL | Status: DC | PRN
Start: 2022-01-05 — End: 2022-01-05

## 2022-01-05 MED ORDER — DIPHENHYDRAMINE HCL 25 MG PO CAPS
25.0000 mg | ORAL_CAPSULE | Freq: Four times a day (QID) | ORAL | Status: DC | PRN
  Administered 2022-01-05: 25 mg via ORAL
  Filled 2022-01-05: qty 1

## 2022-01-05 NOTE — Progress Notes (Signed)
TPE tx complete without complications.  VSS post treatment and patient without complaint.  Patient aware of his next appointment on Wednesday, 9/13 at 1400.  Patient discharged to home with self care.

## 2022-01-06 ENCOUNTER — Inpatient Hospital Stay: Payer: PRIVATE HEALTH INSURANCE

## 2022-01-06 ENCOUNTER — Inpatient Hospital Stay (HOSPITAL_BASED_OUTPATIENT_CLINIC_OR_DEPARTMENT_OTHER): Payer: PRIVATE HEALTH INSURANCE | Admitting: Nurse Practitioner

## 2022-01-06 ENCOUNTER — Encounter: Payer: Self-pay | Admitting: Nurse Practitioner

## 2022-01-06 VITALS — BP 148/90 | HR 96 | Temp 98.2°F | Resp 20 | Ht 70.0 in | Wt 198.2 lb

## 2022-01-06 VITALS — BP 129/86 | HR 69 | Temp 97.5°F | Resp 18

## 2022-01-06 DIAGNOSIS — M3119 Other thrombotic microangiopathy: Secondary | ICD-10-CM

## 2022-01-06 LAB — CMP (CANCER CENTER ONLY)
ALT: 25 U/L (ref 0–44)
AST: 14 U/L — ABNORMAL LOW (ref 15–41)
Albumin: 3.8 g/dL (ref 3.5–5.0)
Alkaline Phosphatase: 53 U/L (ref 38–126)
Anion gap: 11 (ref 5–15)
BUN: 19 mg/dL (ref 6–20)
CO2: 30 mmol/L (ref 22–32)
Calcium: 9.8 mg/dL (ref 8.9–10.3)
Chloride: 102 mmol/L (ref 98–111)
Creatinine: 1 mg/dL (ref 0.61–1.24)
GFR, Estimated: >60 mL/min (ref >60)
Glucose, Bld: 227 mg/dL — ABNORMAL HIGH (ref 70–99)
Potassium: 3.5 mmol/L (ref 3.5–5.1)
Sodium: 143 mmol/L (ref 135–145)
Total Bilirubin: 0.3 mg/dL (ref 0.3–1.2)
Total Protein: 5.8 g/dL — ABNORMAL LOW (ref 6.5–8.1)

## 2022-01-06 LAB — CBC WITH DIFFERENTIAL (CANCER CENTER ONLY)
Abs Immature Granulocytes: 0.03 10*3/uL (ref 0.00–0.07)
Basophils Absolute: 0 10*3/uL (ref 0.0–0.1)
Basophils Relative: 0 %
Eosinophils Absolute: 0.1 10*3/uL (ref 0.0–0.5)
Eosinophils Relative: 1 %
HCT: 38.6 % — ABNORMAL LOW (ref 39.0–52.0)
Hemoglobin: 12.8 g/dL — ABNORMAL LOW (ref 13.0–17.0)
Immature Granulocytes: 0 %
Lymphocytes Relative: 48 %
Lymphs Abs: 4.5 10*3/uL — ABNORMAL HIGH (ref 0.7–4.0)
MCH: 32.8 pg (ref 26.0–34.0)
MCHC: 33.2 g/dL (ref 30.0–36.0)
MCV: 99 fL (ref 80.0–100.0)
Monocytes Absolute: 0.6 10*3/uL (ref 0.1–1.0)
Monocytes Relative: 7 %
Neutro Abs: 4.1 10*3/uL (ref 1.7–7.7)
Neutrophils Relative %: 44 %
Platelet Count: 204 10*3/uL (ref 150–400)
RBC: 3.9 MIL/uL — ABNORMAL LOW (ref 4.22–5.81)
RDW: 12.9 % (ref 11.5–15.5)
WBC Count: 9.4 10*3/uL (ref 4.0–10.5)
nRBC: 0 % (ref 0.0–0.2)

## 2022-01-06 LAB — THERAPEUTIC PLASMA EXCHANGE (BLOOD BANK)
Plasma Exchange: 3400
Plasma volume needed: 3400
Unit division: 0
Unit division: 0
Unit division: 0
Unit division: 0
Unit division: 0
Unit division: 0
Unit division: 0
Unit division: 0
Unit division: 0
Unit division: 0

## 2022-01-06 LAB — LACTATE DEHYDROGENASE: LDH: 156 U/L (ref 98–192)

## 2022-01-06 MED ORDER — FAMOTIDINE IN NACL 20-0.9 MG/50ML-% IV SOLN
20.0000 mg | Freq: Once | INTRAVENOUS | Status: AC
  Administered 2022-01-06: 20 mg via INTRAVENOUS
  Filled 2022-01-06: qty 50

## 2022-01-06 MED ORDER — SULFAMETHOXAZOLE-TRIMETHOPRIM 800-160 MG PO TABS: 1.0000 | ORAL_TABLET | ORAL | 0 refills | Status: DC

## 2022-01-06 MED ORDER — SODIUM CHLORIDE 0.9 % IV SOLN
375.0000 mg/m2 | Freq: Once | INTRAVENOUS | Status: AC
  Administered 2022-01-06: 800 mg via INTRAVENOUS
  Filled 2022-01-06: qty 30

## 2022-01-06 MED ORDER — DIPHENHYDRAMINE HCL 25 MG PO CAPS
50.0000 mg | ORAL_CAPSULE | Freq: Once | ORAL | Status: AC
  Administered 2022-01-06: 50 mg via ORAL
  Filled 2022-01-06: qty 2

## 2022-01-06 MED ORDER — ACETAMINOPHEN 325 MG PO TABS
650.0000 mg | ORAL_TABLET | Freq: Once | ORAL | Status: AC
  Administered 2022-01-06: 650 mg via ORAL
  Filled 2022-01-06: qty 2

## 2022-01-06 MED ORDER — SODIUM CHLORIDE 0.9 % IV SOLN: Freq: Once | INTRAVENOUS | Status: AC

## 2022-01-06 NOTE — Progress Notes (Signed)
Patient seen by Lisa Thomas NP today  Vitals are within treatment parameters.  Labs reviewed by Lisa Thomas NP and are within treatment parameters.  Per physician team, patient is ready for treatment and there are NO modifications to the treatment plan.     

## 2022-01-06 NOTE — Progress Notes (Signed)
[name]   Diagnosis:  TTP  INTERVAL HISTORY:   Eric Hooper returns as scheduled.  He had plasmapheresis yesterday, 01/05/2022.  Hemoglobin returned at 13.2, platelet count 216,000, LDH 172.  Current prednisone dose is 60 mg daily, started 12/31/2021.  He completed cycle 2 rituximab on 01/14/2022.  Overall he feels well.  He continues to note a rash, most pronounced on the chest but also present on the face, back and upper arms.  The rash is pruritic at times.  He denies bleeding.  He notes bruising at the site of Cablivi injections.  Some constipation.  No signs of allergic reaction with Rituxan last week.  Objective:  Vital signs in last 24 hours:  Blood pressure (!) 148/90, pulse 96, temperature 98.2 F (36.8 C), temperature source Oral, resp. rate 20, height '5\' 10"'$  (1.778 m), weight 198 lb 3.2 oz (89.9 kg), SpO2 100 %.    HEENT: No thrush or ulcers. Resp: Lungs clear bilaterally. Cardio: Regular rate and rhythm. GI: No hepatosplenomegaly. Vascular: No significant leg edema. Neuro: Alert and oriented. Skin: Acne type rash at the forehead, chest, shoulders/upper back and upper arms. Right chest catheter site without erythema.  Lab Results:  Lab Results  Component Value Date   WBC 8.7 01/05/2022   HGB 13.2 01/05/2022   HCT 39.2 01/05/2022   MCV 101.6 (H) 01/05/2022   PLT 216 01/05/2022   NEUTROABS 7.5 01/01/2022    Imaging:  No results found.  Medications: I have reviewed the patient's current medications.  Assessment/Plan: 1.  TTP initially diagnosed in October 2018 with recurrence in April 2020 and July 2023. Initiation of daily plasma exchange and Solu-Medrol 02/02/2017 [name] Antibody- 72, activity less than 2% on initial presentation; [name] from 08/15/2018 <2.0. Last plasma exchange 02/06/2017 Prednisone 60 mg daily beginning 02/08/2017 Prednisone 40 mg daily beginning 02/17/2017 Prednisone 30 mg daily beginning  03/09/2017 Prednisone 20 mg daily beginning 03/23/2017 Prednisone 10 mg daily beginning 04/01/2017 Prednisone 5 mg daily for 5 days then 5 mg every other day for 5 doses then stop beginning 05/11/2017 Recurrent TTP 08/14/2018 Plasma exchange/steroids started on 08/15/2018.  Plasma exchange discontinued after treatment on 08/19/2019, discharged on prednisone '60mg'$  daily Prednisone taper to 40 mg daily 08/22/2018 Weekly rituximab for 4 doses beginning 08/25/2018 Plasma exchange resumed 08/26/2018, 08/27/2018, 08/28/2018, 08/29/2018, 08/31/2018, 09/02/2018 Prednisone taper to 30 mg daily 09/01/2018 Cycle 3 weekly Rituxan 09/08/2018 Prednisone taper to 20 mg daily 09/08/2018 Cycle 4 weekly Rituxan 09/15/2018 Prednisone taper to 10 mg daily 09/20/2018 Prednisone taper to 5 mg daily 09/27/2018 Prednisone discontinued 10/07/2018 Admission with relapse of TTP 10/23/2021 Daily Plasma exchange initiated 11/23/2021 prednisone 8/1 Prednisone tapered to 40 mg daily beginning 12/04/2021 Weekly Rituxan for 4 doses beginning 12/04/2021 Discontinue plasmapheresis after appointment 12/05/2021 Admitted to [facility] 12/11/2021-plasmapheresis reinitiated, Cablivi started 12/12/2021, prednisone dose currently 80 mg daily Every other day plasma exchange beginning 12/28/2021 Second weekly Rituxan 12/31/2021 Prednisone taper to 60 mg daily beginning 12/31/2021 Third weekly Rituxan 01/06/2022 Prednisone taper to 40 mg daily beginning 01/07/2022 2. Xiphoid pain-etiology unclear 3. Hypertension 4. Diabetes 5. Alcohol/Tobacco use 6. History of renal insufficiency 7. Right IJ dialysis catheter placed 02/02/2017, removed New right IJ dialysis catheter 08/15/2018 Catheter removed 09/12/2018 New right IJ dialysis catheter placed 11/23/2021 Temporary hemodialysis catheter converted to tunneled hemodialysis catheter 11/28/2021    Disposition: Eric Hooper appears stable.  CBC reviewed.  Hemoglobin and platelets stable.  He will receive the third weekly Rituxan  infusion today.  Taper prednisone to 40 mg daily beginning 01/07/2022.  Plasmapheresis Wednesday and Friday this week.    He will return for lab, follow-up, Rituxan in 1 week.  We are available to see him sooner if needed.   Ned Card ANP/GNP-BC   01/06/2022  8:30 AM

## 2022-01-06 NOTE — Patient Instructions (Signed)
Eric Hooper   Discharge Instructions: Thank you for choosing [facility] to provide your oncology and hematology care.   If you have a lab appointment with the [facility], please go directly to the [facility] and check in at the registration area.   Wear comfortable clothing and clothing appropriate for easy access to any Portacath or PICC line.   We strive to give you quality time with your provider. You may need to reschedule your appointment if you arrive late (15 or more minutes).  Arriving late affects you and other patients whose appointments are after yours.  Also, if you miss three or more appointments without notifying the office, you may be dismissed from the clinic at the provider's discretion.      For prescription refill requests, have your pharmacy contact our office and allow 72 hours for refills to be completed.    Today you received the following chemotherapy and/or immunotherapy agents Rituximab.      To help prevent nausea and vomiting after your treatment, we encourage you to take your nausea medication as directed.  BELOW ARE SYMPTOMS THAT SHOULD BE REPORTED IMMEDIATELY: *FEVER GREATER THAN 100.4 F (38 C) OR HIGHER *CHILLS OR SWEATING *NAUSEA AND VOMITING THAT IS NOT CONTROLLED WITH YOUR NAUSEA MEDICATION *UNUSUAL SHORTNESS OF BREATH *UNUSUAL BRUISING OR BLEEDING *URINARY PROBLEMS (pain or burning when urinating, or frequent urination) *BOWEL PROBLEMS (unusual diarrhea, constipation, pain near the anus) TENDERNESS IN MOUTH AND THROAT WITH OR WITHOUT PRESENCE OF ULCERS (sore throat, sores in mouth, or a toothache) UNUSUAL RASH, SWELLING OR PAIN  UNUSUAL VAGINAL DISCHARGE OR ITCHING   Items with * indicate a potential emergency and should be followed up as soon as possible or go to the Emergency Department if any problems should occur.  Please show the CHEMOTHERAPY ALERT CARD or IMMUNOTHERAPY ALERT CARD at check-in to  the Emergency Department and triage nurse.  Should you have questions after your visit or need to cancel or reschedule your appointment, please contact [facility]  Dept: [phone]  and follow the prompts.  Office hours are 8:00 a.m. to 4:30 p.m. Monday - Friday. Please note that voicemails left after 4:00 p.m. may not be returned until the following business day.  We are closed weekends and major holidays. You have access to a nurse at all times for urgent questions. Please call the main number to the clinic Dept: [phone] and follow the prompts.   For any non-urgent questions, you may also contact your provider using MyChart. We now offer e-Visits for anyone 48 and older to request care online for non-urgent symptoms. For details visit [email or web address].   Also download the MyChart app! Go to the app store, search "MyChart", open the app, select [facility], and log in with your MyChart username and password.  Masks are optional in the cancer centers. If you would like for your care team to wear a mask while they are taking care of you, please let them know. You may have one support person who is at least 48 years old accompany you for your appointments.  Rituximab Injection What is this medication? RITUXIMAB (ri TUX i mab) treats leukemia and lymphoma. It works by blocking a protein that causes cancer cells to grow and multiply. This helps to slow or stop the spread of cancer cells. It may also be used to treat autoimmune conditions, such as arthritis. It works by slowing down an overactive immune  system. It is a monoclonal antibody. This medicine may be used for other purposes; ask your health care provider or pharmacist if you have questions. COMMON BRAND NAME(S): RIABNI, Rituxan, RUXIENCE, truxima What should I tell my care team before I take this medication? They need to know if you have any of these conditions: Chest pain Heart disease Immune system  problems Infection, such as chickenpox, cold sores, hepatitis B, herpes Irregular heartbeat or rhythm Kidney disease Low blood counts, such as low white cells, platelets, red cells Lung disease Recent or upcoming vaccine An unusual or allergic reaction to rituximab, other medications, foods, dyes, or preservatives Pregnant or trying to get pregnant Breast-feeding How should I use this medication? This medication is injected into a vein. It is given by a care team in a hospital or clinic setting. A special MedGuide will be given to you before each treatment. Be sure to read this information carefully each time. Talk to your care team about the use of this medication in children. While this medication may be prescribed for children as young as 6 months for selected conditions, precautions do apply. Overdosage: If you think you have taken too much of this medicine contact a poison control center or emergency room at once. NOTE: This medicine is only for you. Do not share this medicine with others. What if I miss a dose? Keep appointments for follow-up doses. It is important not to miss your dose. Call your care team if you are unable to keep an appointment. What may interact with this medication? Do not take this medication with any of the following: Live vaccines This medication may also interact with the following: Cisplatin This list may not describe all possible interactions. Give your health care provider a list of all the medicines, herbs, non-prescription drugs, or dietary supplements you use. Also tell them if you smoke, drink alcohol, or use illegal drugs. Some items may interact with your medicine. What should I watch for while using this medication? Your condition will be monitored carefully while you are receiving this medication. You may need blood work while taking this medication. This medication can cause serious infusion reactions. To reduce the risk your care team may give  you other medications to take before receiving this one. Be sure to follow the directions from your care team. This medication may increase your risk of getting an infection. Call your care team for advice if you get a fever, chills, sore throat, or other symptoms of a cold or flu. Do not treat yourself. Try to avoid being around people who are sick. Call your care team if you are around anyone with measles, chickenpox, or if you develop sores or blisters that do not heal properly. Avoid taking medications that contain aspirin, acetaminophen, ibuprofen, naproxen, or ketoprofen unless instructed by your care team. These medications may hide a fever. This medication may cause serious skin reactions. They can happen weeks to months after starting the medication. Contact your care team right away if you notice fevers or flu-like symptoms with a rash. The rash may be red or purple and then turn into blisters or peeling of the skin. You may also notice a red rash with swelling of the face, lips, or lymph nodes in your neck or under your arms. In some patients, this medication may cause a serious brain infection that may cause death. If you have any problems seeing, thinking, speaking, walking, or standing, tell your care team right away. If you cannot reach  your care team, urgently seek another source of medical care. Talk to your care team if you may be pregnant. Serious birth defects can occur if you take this medication during pregnancy and for 12 months after the last dose. You will need a negative pregnancy test before starting this medication. Contraception is recommended while taking this medication and for 12 months after the last dose. Your care team can help you find the option that works for you. Do not breastfeed while taking this medication and for at least 6 months after the last dose. What side effects may I notice from receiving this medication? Side effects that you should report to your care  team as soon as possible: Allergic reactions or angioedema--skin rash, itching or hives, swelling of the face, eyes, lips, tongue, arms, or legs, trouble swallowing or breathing Bowel blockage--stomach cramping, unable to have a bowel movement or pass gas, loss of appetite, vomiting Dizziness, loss of balance or coordination, confusion or trouble speaking Heart attack--pain or tightness in the chest, shoulders, arms, or jaw, nausea, shortness of breath, cold or clammy skin, feeling faint or lightheaded Heart rhythm changes--fast or irregular heartbeat, dizziness, feeling faint or lightheaded, chest pain, trouble breathing Infection--fever, chills, cough, sore throat, wounds that don't heal, pain or trouble when passing urine, general feeling of discomfort or being unwell Infusion reactions--chest pain, shortness of breath or trouble breathing, feeling faint or lightheaded Kidney injury--decrease in the amount of urine, swelling of the ankles, hands, or feet Liver injury--right upper belly pain, loss of appetite, nausea, light-colored stool, dark yellow or brown urine, yellowing skin or eyes, unusual weakness or fatigue Redness, blistering, peeling, or loosening of the skin, including inside the mouth Stomach pain that is severe, does not go away, or gets worse Tumor lysis syndrome (TLS)--nausea, vomiting, diarrhea, decrease in the amount of urine, dark urine, unusual weakness or fatigue, confusion, muscle pain or cramps, fast or irregular heartbeat, joint pain Side effects that usually do not require medical attention (report to your care team if they continue or are bothersome): Headache Joint pain Nausea Runny or stuffy nose Unusual weakness or fatigue This list may not describe all possible side effects. Call your doctor for medical advice about side effects. You may report side effects to FDA at 1-800-FDA-1088. Where should I keep my medication? This medication is given in a hospital or  clinic. It will not be stored at home. NOTE: This sheet is a summary. It may not cover all possible information. If you have questions about this medicine, talk to your doctor, pharmacist, or health care provider.  2023 Elsevier/Gold Standard (2021-09-01 00:00:00)

## 2022-01-07 ENCOUNTER — Non-Acute Institutional Stay (HOSPITAL_COMMUNITY)
Admission: RE | Admit: 2022-01-07 | Discharge: 2022-01-07 | Disposition: A | Discharge status: Home / Self Care | Payer: Medicaid Other | Source: Ambulatory Visit | Attending: Oncology | Admitting: Oncology

## 2022-01-07 DIAGNOSIS — D696 Thrombocytopenia, unspecified: Secondary | ICD-10-CM | POA: Diagnosis not present

## 2022-01-07 DIAGNOSIS — M3119 Other thrombotic microangiopathy: Secondary | ICD-10-CM | POA: Diagnosis present

## 2022-01-07 LAB — CBC
HCT: 39.1 % (ref 39.0–52.0)
Hemoglobin: 12.8 g/dL — ABNORMAL LOW (ref 13.0–17.0)
MCH: 33 pg (ref 26.0–34.0)
MCHC: 32.7 g/dL (ref 30.0–36.0)
MCV: 100.8 fL — ABNORMAL HIGH (ref 80.0–100.0)
Platelets: 205 10*3/uL (ref 150–400)
RBC: 3.88 MIL/uL — ABNORMAL LOW (ref 4.22–5.81)
RDW: 12.7 % (ref 11.5–15.5)
WBC: 7.1 10*3/uL (ref 4.0–10.5)
nRBC: 0 % (ref 0.0–0.2)

## 2022-01-07 LAB — BASIC METABOLIC PANEL
Anion gap: 7 (ref 5–15)
BUN: 16 mg/dL (ref 6–20)
CO2: 25 mmol/L (ref 22–32)
Calcium: 9.2 mg/dL (ref 8.9–10.3)
Chloride: 105 mmol/L (ref 98–111)
Creatinine, Ser: 0.79 mg/dL (ref 0.61–1.24)
GFR, Estimated: >60 mL/min (ref >60)
Glucose, Bld: 236 mg/dL — ABNORMAL HIGH (ref 70–99)
Potassium: 4 mmol/L (ref 3.5–5.1)
Sodium: 137 mmol/L (ref 135–145)

## 2022-01-07 LAB — ADAMTS13 ACTIVITY REFLEX

## 2022-01-07 LAB — ADAMTS13 ACTIVITY: Adamts 13 Activity: 46.4 % — ABNORMAL LOW (ref >66.8)

## 2022-01-07 LAB — LACTATE DEHYDROGENASE: LDH: 141 U/L (ref 98–192)

## 2022-01-07 MED ORDER — ACETAMINOPHEN 325 MG PO TABS: 650.0000 mg | ORAL_TABLET | ORAL | Status: DC | PRN

## 2022-01-07 MED ORDER — ANTICOAGULANT SODIUM CITRATE 4% (200MG/5ML) IV SOLN
5.0000 mL | Freq: Once | Status: AC
  Administered 2022-01-07: 5 mL
  Filled 2022-01-07: qty 5

## 2022-01-07 MED ORDER — DIPHENHYDRAMINE HCL 25 MG PO CAPS
25.0000 mg | ORAL_CAPSULE | Freq: Four times a day (QID) | ORAL | Status: DC | PRN
  Administered 2022-01-07: 25 mg via ORAL
  Filled 2022-01-07: qty 1

## 2022-01-07 MED ORDER — ACD FORMULA A 0.73-2.45-2.2 GM/100ML VI SOLN
1000.0000 mL | Status: DC
  Administered 2022-01-07: 1000 mL
  Filled 2022-01-07: qty 1000

## 2022-01-07 MED ORDER — CALCIUM CARBONATE ANTACID 500 MG PO CHEW
2.0000 | CHEWABLE_TABLET | ORAL | Status: DC
  Administered 2022-01-07: 400 mg via ORAL
  Filled 2022-01-07: qty 2

## 2022-01-07 MED ORDER — CALCIUM GLUCONATE-NACL 2-0.675 GM/100ML-% IV SOLN: 2.0000 g | Freq: Once | INTRAVENOUS | Status: DC

## 2022-01-07 MED ORDER — CALCIUM GLUCONATE-NACL 2-0.675 GM/100ML-% IV SOLN
INTRAVENOUS | Status: AC
  Administered 2022-01-07: 2000 mg
  Filled 2022-01-07: qty 100

## 2022-01-07 NOTE — Progress Notes (Signed)
TPE tx completed without complications.  VSS post treatment and patient without complaint.  Patient aware of next treatment scheduled for Friday, 9/15.  Patient discharged to home with self care.

## 2022-01-07 NOTE — Progress Notes (Signed)
..  Patient is receiving Replacement Medication. Medication: Rituxan Manufacturer: Celso Amy Approval Dates: Approved from 01/07/2022 until indefinitely. ID: [id number] Reason: insurance denial First DOS: 12/31/2021

## 2022-01-08 ENCOUNTER — Other Ambulatory Visit: Payer: Self-pay | Admitting: Nurse Practitioner

## 2022-01-08 DIAGNOSIS — M3119 Other thrombotic microangiopathy: Secondary | ICD-10-CM

## 2022-01-08 LAB — THERAPEUTIC PLASMA EXCHANGE (BLOOD BANK)
Plasma Exchange: 3300
Plasma volume needed: 3300
Unit division: 0
Unit division: 0
Unit division: 0
Unit division: 0
Unit division: 0
Unit division: 0
Unit division: 0
Unit division: 0
Unit division: 0

## 2022-01-09 ENCOUNTER — Non-Acute Institutional Stay (HOSPITAL_COMMUNITY)
Admission: RE | Admit: 2022-01-09 | Discharge: 2022-01-09 | Disposition: A | Discharge status: Home / Self Care | Payer: Medicaid Other | Attending: Oncology | Admitting: Oncology

## 2022-01-09 DIAGNOSIS — M3119 Other thrombotic microangiopathy: Secondary | ICD-10-CM | POA: Insufficient documentation

## 2022-01-09 LAB — BASIC METABOLIC PANEL
Anion gap: 8 (ref 5–15)
BUN: 13 mg/dL (ref 6–20)
CO2: 25 mmol/L (ref 22–32)
Calcium: 9.6 mg/dL (ref 8.9–10.3)
Chloride: 107 mmol/L (ref 98–111)
Creatinine, Ser: 0.85 mg/dL (ref 0.61–1.24)
GFR, Estimated: >60 mL/min (ref >60)
Glucose, Bld: 204 mg/dL — ABNORMAL HIGH (ref 70–99)
Potassium: 4 mmol/L (ref 3.5–5.1)
Sodium: 140 mmol/L (ref 135–145)

## 2022-01-09 MED ORDER — CALCIUM GLUCONATE-NACL 2-0.675 GM/100ML-% IV SOLN
2.0000 g | Freq: Once | INTRAVENOUS | Status: AC
  Administered 2022-01-09: 2000 mg via INTRAVENOUS

## 2022-01-09 MED ORDER — DIPHENHYDRAMINE HCL 25 MG PO CAPS
25.0000 mg | ORAL_CAPSULE | Freq: Four times a day (QID) | ORAL | Status: DC | PRN
  Administered 2022-01-09: 25 mg via ORAL
  Filled 2022-01-09: qty 1

## 2022-01-09 MED ORDER — CALCIUM CARBONATE ANTACID 500 MG PO CHEW
2.0000 | CHEWABLE_TABLET | ORAL | Status: DC
  Administered 2022-01-09: 400 mg via ORAL
  Filled 2022-01-09: qty 2

## 2022-01-09 MED ORDER — ACETAMINOPHEN 325 MG PO TABS: 650.0000 mg | ORAL_TABLET | ORAL | Status: DC | PRN

## 2022-01-09 MED ORDER — ACD FORMULA A 0.73-2.45-2.2 GM/100ML VI SOLN
Status: AC
  Filled 2022-01-09: qty 1000

## 2022-01-09 MED ORDER — ACD FORMULA A 0.73-2.45-2.2 GM/100ML VI SOLN
1000.0000 mL | Status: DC
  Administered 2022-01-09: 1000 mL

## 2022-01-09 MED ORDER — CALCIUM GLUCONATE-NACL 2-0.675 GM/100ML-% IV SOLN
INTRAVENOUS | Status: AC
  Filled 2022-01-09: qty 100

## 2022-01-09 MED ORDER — ANTICOAGULANT SODIUM CITRATE 4% (200MG/5ML) IV SOLN
5.0000 mL | Freq: Once | Status: AC
  Administered 2022-01-09: 5 mL
  Filled 2022-01-09 (×3): qty 5

## 2022-01-09 NOTE — Progress Notes (Signed)
Patient completed TPE without complications.  VSS post treatment.  Patient is without complaint and aware of his follow up with Dr. Learta Codding on Tuesday, 9/19.  Patient currently has no further TPE scheduled.  Patient discharged to home with self.

## 2022-01-10 LAB — THERAPEUTIC PLASMA EXCHANGE (BLOOD BANK)
Plasma Exchange: 3300
Plasma volume needed: 3300
Unit division: 0
Unit division: 0
Unit division: 0
Unit division: 0
Unit division: 0
Unit division: 0
Unit division: 0
Unit division: 0
Unit division: 0

## 2022-01-13 ENCOUNTER — Inpatient Hospital Stay: Payer: PRIVATE HEALTH INSURANCE

## 2022-01-13 ENCOUNTER — Inpatient Hospital Stay (HOSPITAL_BASED_OUTPATIENT_CLINIC_OR_DEPARTMENT_OTHER): Payer: PRIVATE HEALTH INSURANCE | Admitting: Oncology

## 2022-01-13 VITALS — BP 127/80 | HR 67 | Temp 98.1°F | Resp 16

## 2022-01-13 VITALS — BP 132/90 | HR 88 | Temp 98.2°F | Resp 18 | Ht 70.0 in | Wt 194.0 lb

## 2022-01-13 DIAGNOSIS — M3119 Other thrombotic microangiopathy: Secondary | ICD-10-CM

## 2022-01-13 LAB — CBC WITH DIFFERENTIAL (CANCER CENTER ONLY)
Abs Immature Granulocytes: 0.02 10*3/uL (ref 0.00–0.07)
Basophils Absolute: 0 10*3/uL (ref 0.0–0.1)
Basophils Relative: 0 %
Eosinophils Absolute: 0.1 10*3/uL (ref 0.0–0.5)
Eosinophils Relative: 1 %
HCT: 37.2 % — ABNORMAL LOW (ref 39.0–52.0)
Hemoglobin: 12.3 g/dL — ABNORMAL LOW (ref 13.0–17.0)
Immature Granulocytes: 0 %
Lymphocytes Relative: 30 %
Lymphs Abs: 2.8 10*3/uL (ref 0.7–4.0)
MCH: 32.2 pg (ref 26.0–34.0)
MCHC: 33.1 g/dL (ref 30.0–36.0)
MCV: 97.4 fL (ref 80.0–100.0)
Monocytes Absolute: 0.6 10*3/uL (ref 0.1–1.0)
Monocytes Relative: 6 %
Neutro Abs: 6 10*3/uL (ref 1.7–7.7)
Neutrophils Relative %: 63 %
Platelet Count: 245 10*3/uL (ref 150–400)
RBC: 3.82 MIL/uL — ABNORMAL LOW (ref 4.22–5.81)
RDW: 12.7 % (ref 11.5–15.5)
WBC Count: 9.5 10*3/uL (ref 4.0–10.5)
nRBC: 0 % (ref 0.0–0.2)

## 2022-01-13 LAB — CMP (CANCER CENTER ONLY)
ALT: 33 U/L (ref 0–44)
AST: 12 U/L — ABNORMAL LOW (ref 15–41)
Albumin: 3.9 g/dL (ref 3.5–5.0)
Alkaline Phosphatase: 41 U/L (ref 38–126)
Anion gap: 10 (ref 5–15)
BUN: 17 mg/dL (ref 6–20)
CO2: 23 mmol/L (ref 22–32)
Calcium: 9.2 mg/dL (ref 8.9–10.3)
Chloride: 107 mmol/L (ref 98–111)
Creatinine: 0.9 mg/dL (ref 0.61–1.24)
GFR, Estimated: >60 mL/min (ref >60)
Glucose, Bld: 192 mg/dL — ABNORMAL HIGH (ref 70–99)
Potassium: 3.6 mmol/L (ref 3.5–5.1)
Sodium: 140 mmol/L (ref 135–145)
Total Bilirubin: 0.4 mg/dL (ref 0.3–1.2)
Total Protein: 5.8 g/dL — ABNORMAL LOW (ref 6.5–8.1)

## 2022-01-13 LAB — LACTATE DEHYDROGENASE: LDH: 152 U/L (ref 98–192)

## 2022-01-13 MED ORDER — HEPARIN SODIUM (PORCINE) 5000 UNIT/ML IJ SOLN: 1600.0000 [IU] | Freq: Once | INTRAMUSCULAR | Status: DC

## 2022-01-13 MED ORDER — SODIUM CHLORIDE 0.9 % IV SOLN: Freq: Once | INTRAVENOUS | Status: AC

## 2022-01-13 MED ORDER — FAMOTIDINE IN NACL 20-0.9 MG/50ML-% IV SOLN
20.0000 mg | Freq: Once | INTRAVENOUS | Status: AC
  Administered 2022-01-13: 20 mg via INTRAVENOUS
  Filled 2022-01-13: qty 50

## 2022-01-13 MED ORDER — HEPARIN SODIUM (PORCINE) PF 5000 UNIT/ML IJ SOLN: 1600.0000 [IU] | INTRAMUSCULAR | Status: DC

## 2022-01-13 MED ORDER — ACETAMINOPHEN 325 MG PO TABS
650.0000 mg | ORAL_TABLET | Freq: Once | ORAL | Status: AC
  Administered 2022-01-13: 650 mg via ORAL
  Filled 2022-01-13: qty 2

## 2022-01-13 MED ORDER — SODIUM CHLORIDE 0.9 % IV SOLN
375.0000 mg/m2 | Freq: Once | INTRAVENOUS | Status: DC
  Filled 2022-01-13: qty 80

## 2022-01-13 MED ORDER — DIPHENHYDRAMINE HCL 25 MG PO CAPS
50.0000 mg | ORAL_CAPSULE | Freq: Once | ORAL | Status: AC
  Administered 2022-01-13: 50 mg via ORAL
  Filled 2022-01-13: qty 2

## 2022-01-13 MED ORDER — SODIUM CHLORIDE 0.9 % IV SOLN
375.0000 mg/m2 | Freq: Once | INTRAVENOUS | Status: AC
  Administered 2022-01-13: 800 mg via INTRAVENOUS
  Filled 2022-01-13: qty 50

## 2022-01-13 MED ORDER — HEPARIN SODIUM (PORCINE) 5000 UNIT/ML IJ SOLN
1600.0000 [IU] | INTRAMUSCULAR | Status: AC
  Administered 2022-01-13 (×2): 1600 [IU] via INTRAVENOUS
  Filled 2022-01-13: qty 0.32

## 2022-01-13 NOTE — Progress Notes (Signed)
[facility] OFFICE PROGRESS NOTE   Diagnosis: TTP  INTERVAL HISTORY:   Eric Hooper returns as scheduled.  He continues Cablivi and prednisone.  He completed plasmapheresis on Monday Wednesday and Friday of last week.  He reports tolerating treatment well.  He has a pruritic rash over the trunk.  He bruises easily.  No other bleeding.  He reports heartburn.  Leg edema has resolved.  Objective:  Vital signs in last 24 hours:  Blood pressure (!) 132/90, pulse 88, temperature 98.2 F (36.8 C), temperature source Oral, resp. rate 18, height '5\' 10"'$  (1.778 m), weight 194 lb (88 kg), SpO2 100 %.    HEENT: No thrush or ulcers Resp: Lungs clear bilaterally Cardio: Regular rate and rhythm GI: No hepatosplenomegaly, nontender Vascular: No leg edema  Skin: Mild acne type rash over the trunk, small ecchymoses at the lower abdomen injection sites  Portacath/PICC-without erythema  Lab Results:  Lab Results  Component Value Date   WBC 9.5 01/13/2022   HGB 12.3 (L) 01/13/2022   HCT 37.2 (L) 01/13/2022   MCV 97.4 01/13/2022   PLT 245 01/13/2022   NEUTROABS 6.0 01/13/2022    CMP  Lab Results  Component Value Date   NA 140 01/09/2022   K 4.0 01/09/2022   CL 107 01/09/2022   CO2 25 01/09/2022   GLUCOSE 204 (H) 01/09/2022   BUN 13 01/09/2022   CREATININE 0.85 01/09/2022   CALCIUM 9.6 01/09/2022   PROT 5.8 (L) 01/06/2022   ALBUMIN 3.8 01/06/2022   AST 14 (L) 01/06/2022   ALT 25 01/06/2022   ALKPHOS 53 01/06/2022   BILITOT 0.3 01/06/2022   GFRNONAA >60 01/09/2022   GFRAA 125 06/14/2020    No results found for: "CEA1", "CEA", "DDU202", "CA125"  Lab Results  Component Value Date   INR 1.1 08/15/2018   LABPROT 14.4 08/15/2018    Imaging:  No results found.  Medications: I have reviewed the patient's current medications.   Assessment/Plan: TTP initially diagnosed in October 2018 with recurrence in April 2020 and July 2023. Initiation of daily plasma  exchange and Solu-Medrol 02/02/2017 [name] Antibody- 72, activity less than 2% on initial presentation; [name] from 08/15/2018 <2.0. Last plasma exchange 02/06/2017 Prednisone 60 mg daily beginning 02/08/2017 Prednisone 40 mg daily beginning 02/17/2017 Prednisone 30 mg daily beginning 03/09/2017 Prednisone 20 mg daily beginning 03/23/2017 Prednisone 10 mg daily beginning 04/01/2017 Prednisone 5 mg daily for 5 days then 5 mg every other day for 5 doses then stop beginning 05/11/2017 Recurrent TTP 08/14/2018 Plasma exchange/steroids started on 08/15/2018.  Plasma exchange discontinued after treatment on 08/19/2019, discharged on prednisone '60mg'$  daily Prednisone taper to 40 mg daily 08/22/2018 Weekly rituximab for 4 doses beginning 08/25/2018 Plasma exchange resumed 08/26/2018, 08/27/2018, 08/28/2018, 08/29/2018, 08/31/2018, 09/02/2018 Prednisone taper to 30 mg daily 09/01/2018 Cycle 3 weekly Rituxan 09/08/2018 Prednisone taper to 20 mg daily 09/08/2018 Cycle 4 weekly Rituxan 09/15/2018 Prednisone taper to 10 mg daily 09/20/2018 Prednisone taper to 5 mg daily 09/27/2018 Prednisone discontinued 10/07/2018 Admission with relapse of TTP 10/23/2021 Daily Plasma exchange initiated 11/23/2021 prednisone 8/1 Prednisone tapered to 40 mg daily beginning 12/04/2021 Weekly Rituxan for 4 doses beginning 12/04/2021 Discontinue plasmapheresis after appointment 12/05/2021 Admitted to [facility] 12/11/2021-plasmapheresis reinitiated, Cablivi started 12/12/2021, prednisone dose currently 80 mg daily Every other day plasma exchange beginning 12/28/2021-completed 01/09/2022 Second weekly Rituxan 12/31/2021 Prednisone taper to 60 mg daily beginning 12/31/2021 Third weekly Rituxan 01/06/2022 Prednisone taper to 40 mg daily beginning 01/07/2022 Fourth weekly rituximab 01/13/2022 Prednisone  taper to 30 mg daily 01/13/2022 2. Xiphoid pain-etiology unclear 3. Hypertension 4. Diabetes 5. Alcohol/Tobacco use 6. History of renal insufficiency 7. Right  IJ dialysis catheter placed 02/02/2017, removed New right IJ dialysis catheter 08/15/2018 Catheter removed 09/12/2018 New right IJ dialysis catheter placed 11/23/2021 Temporary hemodialysis catheter converted to tunneled hemodialysis catheter 11/28/2021      Disposition: Eric Hooper appears stable.  The platelet count remains in the normal range.  The bilirubin is normal.  The ADAMTS activity was improved last week.  We will follow-up on the ADAMTS from today.  He will complete a final treat with rituximab today.  He will taper the prednisone to 30 mg daily.  He will continue Cablivi until the current supply is completed.  He will return for an office and lab visit in 1 week.  Plasmapheresis will be placed on hold.  The tunneled a pheresis catheter remains in place.  [name], MD  01/13/2022  9:37 AM

## 2022-01-13 NOTE — Patient Instructions (Signed)
[facility]   Discharge Instructions: Thank you for choosing [facility] to provide your oncology and hematology care.   If you have a lab appointment with the [facility], please go directly to the [facility] and check in at the registration area.   Wear comfortable clothing and clothing appropriate for easy access to any Portacath or PICC line.   We strive to give you quality time with your provider. You may need to reschedule your appointment if you arrive late (15 or more minutes).  Arriving late affects you and other patients whose appointments are after yours.  Also, if you miss three or more appointments without notifying the office, you may be dismissed from the clinic at the provider's discretion.      For prescription refill requests, have your pharmacy contact our office and allow 72 hours for refills to be completed.    Today you received the following chemotherapy and/or immunotherapy agents Rituxan      To help prevent nausea and vomiting after your treatment, we encourage you to take your nausea medication as directed.  BELOW ARE SYMPTOMS THAT SHOULD BE REPORTED IMMEDIATELY: *FEVER GREATER THAN 100.4 F (38 C) OR HIGHER *CHILLS OR SWEATING *NAUSEA AND VOMITING THAT IS NOT CONTROLLED WITH YOUR NAUSEA MEDICATION *UNUSUAL SHORTNESS OF BREATH *UNUSUAL BRUISING OR BLEEDING *URINARY PROBLEMS (pain or burning when urinating, or frequent urination) *BOWEL PROBLEMS (unusual diarrhea, constipation, pain near the anus) TENDERNESS IN MOUTH AND THROAT WITH OR WITHOUT PRESENCE OF ULCERS (sore throat, sores in mouth, or a toothache) UNUSUAL RASH, SWELLING OR PAIN  UNUSUAL VAGINAL DISCHARGE OR ITCHING   Items with * indicate a potential emergency and should be followed up as soon as possible or go to the Emergency Department if any problems should occur.  Please show the CHEMOTHERAPY ALERT CARD or IMMUNOTHERAPY ALERT CARD at check-in to the  Emergency Department and triage nurse.  Should you have questions after your visit or need to cancel or reschedule your appointment, please contact [facility]  Dept: [phone]  and follow the prompts.  Office hours are 8:00 a.m. to 4:30 p.m. Monday - Friday. Please note that voicemails left after 4:00 p.m. may not be returned until the following business day.  We are closed weekends and major holidays. You have access to a nurse at all times for urgent questions. Please call the main number to the clinic Dept: [phone] and follow the prompts.   For any non-urgent questions, you may also contact your provider using MyChart. We now offer e-Visits for anyone 55 and older to request care online for non-urgent symptoms. For details visit [email or web address].   Also download the MyChart app! Go to the app store, search "MyChart", open the app, select [facility], and log in with your MyChart username and password.  Masks are optional in the cancer centers. If you would like for your care team to wear a mask while they are taking care of you, please let them know. You may have one support person who is at least 48 years old accompany you for your appointments.  Rituximab Injection What is this medication? RITUXIMAB (ri TUX i mab) treats leukemia and lymphoma. It works by blocking a protein that causes cancer cells to grow and multiply. This helps to slow or stop the spread of cancer cells. It may also be used to treat autoimmune conditions, such as arthritis. It works by slowing down an overactive immune  system. It is a monoclonal antibody. This medicine may be used for other purposes; ask your health care provider or pharmacist if you have questions. COMMON BRAND NAME(S): RIABNI, Rituxan, RUXIENCE, truxima What should I tell my care team before I take this medication? They need to know if you have any of these conditions: Chest pain Heart disease Immune system  problems Infection, such as chickenpox, cold sores, hepatitis B, herpes Irregular heartbeat or rhythm Kidney disease Low blood counts, such as low white cells, platelets, red cells Lung disease Recent or upcoming vaccine An unusual or allergic reaction to rituximab, other medications, foods, dyes, or preservatives Pregnant or trying to get pregnant Breast-feeding How should I use this medication? This medication is injected into a vein. It is given by a care team in a hospital or clinic setting. A special MedGuide will be given to you before each treatment. Be sure to read this information carefully each time. Talk to your care team about the use of this medication in children. While this medication may be prescribed for children as young as 6 months for selected conditions, precautions do apply. Overdosage: If you think you have taken too much of this medicine contact a poison control center or emergency room at once. NOTE: This medicine is only for you. Do not share this medicine with others. What if I miss a dose? Keep appointments for follow-up doses. It is important not to miss your dose. Call your care team if you are unable to keep an appointment. What may interact with this medication? Do not take this medication with any of the following: Live vaccines This medication may also interact with the following: Cisplatin This list may not describe all possible interactions. Give your health care provider a list of all the medicines, herbs, non-prescription drugs, or dietary supplements you use. Also tell them if you smoke, drink alcohol, or use illegal drugs. Some items may interact with your medicine. What should I watch for while using this medication? Your condition will be monitored carefully while you are receiving this medication. You may need blood work while taking this medication. This medication can cause serious infusion reactions. To reduce the risk your care team may give  you other medications to take before receiving this one. Be sure to follow the directions from your care team. This medication may increase your risk of getting an infection. Call your care team for advice if you get a fever, chills, sore throat, or other symptoms of a cold or flu. Do not treat yourself. Try to avoid being around people who are sick. Call your care team if you are around anyone with measles, chickenpox, or if you develop sores or blisters that do not heal properly. Avoid taking medications that contain aspirin, acetaminophen, ibuprofen, naproxen, or ketoprofen unless instructed by your care team. These medications may hide a fever. This medication may cause serious skin reactions. They can happen weeks to months after starting the medication. Contact your care team right away if you notice fevers or flu-like symptoms with a rash. The rash may be red or purple and then turn into blisters or peeling of the skin. You may also notice a red rash with swelling of the face, lips, or lymph nodes in your neck or under your arms. In some patients, this medication may cause a serious brain infection that may cause death. If you have any problems seeing, thinking, speaking, walking, or standing, tell your care team right away. If you cannot reach  your care team, urgently seek another source of medical care. Talk to your care team if you may be pregnant. Serious birth defects can occur if you take this medication during pregnancy and for 12 months after the last dose. You will need a negative pregnancy test before starting this medication. Contraception is recommended while taking this medication and for 12 months after the last dose. Your care team can help you find the option that works for you. Do not breastfeed while taking this medication and for at least 6 months after the last dose. What side effects may I notice from receiving this medication? Side effects that you should report to your care  team as soon as possible: Allergic reactions or angioedema--skin rash, itching or hives, swelling of the face, eyes, lips, tongue, arms, or legs, trouble swallowing or breathing Bowel blockage--stomach cramping, unable to have a bowel movement or pass gas, loss of appetite, vomiting Dizziness, loss of balance or coordination, confusion or trouble speaking Heart attack--pain or tightness in the chest, shoulders, arms, or jaw, nausea, shortness of breath, cold or clammy skin, feeling faint or lightheaded Heart rhythm changes--fast or irregular heartbeat, dizziness, feeling faint or lightheaded, chest pain, trouble breathing Infection--fever, chills, cough, sore throat, wounds that don't heal, pain or trouble when passing urine, general feeling of discomfort or being unwell Infusion reactions--chest pain, shortness of breath or trouble breathing, feeling faint or lightheaded Kidney injury--decrease in the amount of urine, swelling of the ankles, hands, or feet Liver injury--right upper belly pain, loss of appetite, nausea, light-colored stool, dark yellow or brown urine, yellowing skin or eyes, unusual weakness or fatigue Redness, blistering, peeling, or loosening of the skin, including inside the mouth Stomach pain that is severe, does not go away, or gets worse Tumor lysis syndrome (TLS)--nausea, vomiting, diarrhea, decrease in the amount of urine, dark urine, unusual weakness or fatigue, confusion, muscle pain or cramps, fast or irregular heartbeat, joint pain Side effects that usually do not require medical attention (report to your care team if they continue or are bothersome): Headache Joint pain Nausea Runny or stuffy nose Unusual weakness or fatigue This list may not describe all possible side effects. Call your doctor for medical advice about side effects. You may report side effects to FDA at 1-800-FDA-1088. Where should I keep my medication? This medication is given in a hospital or  clinic. It will not be stored at home. NOTE: This sheet is a summary. It may not cover all possible information. If you have questions about this medicine, talk to your doctor, pharmacist, or health care provider.  2023 Elsevier/Gold Standard (2021-09-01 00:00:00)

## 2022-01-13 NOTE — Progress Notes (Unsigned)
Patient seen by Dr. Sherrill today ? ?Vitals are within treatment parameters. ? ?Labs reviewed by Dr. Sherrill and are within treatment parameters. ? ?Per physician team, patient is ready for treatment and there are NO modifications to the treatment plan.  ?

## 2022-01-16 ENCOUNTER — Telehealth: Payer: Self-pay

## 2022-01-16 ENCOUNTER — Inpatient Hospital Stay: Payer: PRIVATE HEALTH INSURANCE

## 2022-01-16 VITALS — BP 139/89 | HR 85 | Temp 98.1°F | Resp 18

## 2022-01-16 DIAGNOSIS — M3119 Other thrombotic microangiopathy: Secondary | ICD-10-CM

## 2022-01-16 LAB — ADAMTS13 ANTIBODY: ADAMTS13 Antibody: 3 Units/mL (ref <12)

## 2022-01-16 MED ORDER — HEPARIN SODIUM (PORCINE) 1000 UNIT/ML IJ SOLN
1600.0000 [IU] | Freq: Once | INTRAMUSCULAR | Status: AC
  Administered 2022-01-16: 1600 [IU]
  Filled 2022-01-16: qty 1.6

## 2022-01-16 NOTE — Telephone Encounter (Signed)
Call placed to pt as requested to discuss rash. No answer, VM left with following message Per MD Benay Spice: it will improve as he gets off the prednisone. Can try Benadryl 25 mg every 6 hours as need for itching and Benadryl cream topical. Nothing else at this time.

## 2022-01-16 NOTE — Patient Instructions (Signed)

## 2022-01-20 ENCOUNTER — Other Ambulatory Visit: Payer: Self-pay

## 2022-01-20 ENCOUNTER — Inpatient Hospital Stay: Payer: PRIVATE HEALTH INSURANCE

## 2022-01-20 ENCOUNTER — Ambulatory Visit (INDEPENDENT_AMBULATORY_CARE_PROVIDER_SITE_OTHER): Payer: PRIVATE HEALTH INSURANCE

## 2022-01-20 ENCOUNTER — Encounter: Payer: Self-pay | Admitting: Nurse Practitioner

## 2022-01-20 ENCOUNTER — Inpatient Hospital Stay (HOSPITAL_BASED_OUTPATIENT_CLINIC_OR_DEPARTMENT_OTHER): Payer: PRIVATE HEALTH INSURANCE | Admitting: Nurse Practitioner

## 2022-01-20 VITALS — BP 147/90 | HR 85 | Temp 98.2°F | Resp 18 | Ht 70.0 in | Wt 193.4 lb

## 2022-01-20 DIAGNOSIS — M3119 Other thrombotic microangiopathy: Secondary | ICD-10-CM

## 2022-01-20 DIAGNOSIS — M25562 Pain in left knee: Secondary | ICD-10-CM

## 2022-01-20 LAB — CMP (CANCER CENTER ONLY)
ALT: 37 U/L (ref 0–44)
AST: 12 U/L — ABNORMAL LOW (ref 15–41)
Albumin: 4 g/dL (ref 3.5–5.0)
Alkaline Phosphatase: 47 U/L (ref 38–126)
Anion gap: 10 (ref 5–15)
BUN: 19 mg/dL (ref 6–20)
CO2: 28 mmol/L (ref 22–32)
Calcium: 9.8 mg/dL (ref 8.9–10.3)
Chloride: 104 mmol/L (ref 98–111)
Creatinine: 1.06 mg/dL (ref 0.61–1.24)
GFR, Estimated: >60 mL/min (ref >60)
Glucose, Bld: 206 mg/dL — ABNORMAL HIGH (ref 70–99)
Potassium: 3.5 mmol/L (ref 3.5–5.1)
Sodium: 142 mmol/L (ref 135–145)
Total Bilirubin: 0.4 mg/dL (ref 0.3–1.2)
Total Protein: 6 g/dL — ABNORMAL LOW (ref 6.5–8.1)

## 2022-01-20 LAB — CBC WITH DIFFERENTIAL (CANCER CENTER ONLY)
Abs Immature Granulocytes: 0.03 10*3/uL (ref 0.00–0.07)
Basophils Absolute: 0 10*3/uL (ref 0.0–0.1)
Basophils Relative: 0 %
Eosinophils Absolute: 0.1 10*3/uL (ref 0.0–0.5)
Eosinophils Relative: 1 %
HCT: 38.1 % — ABNORMAL LOW (ref 39.0–52.0)
Hemoglobin: 12.4 g/dL — ABNORMAL LOW (ref 13.0–17.0)
Immature Granulocytes: 0 %
Lymphocytes Relative: 40 %
Lymphs Abs: 3.6 10*3/uL (ref 0.7–4.0)
MCH: 31 pg (ref 26.0–34.0)
MCHC: 32.5 g/dL (ref 30.0–36.0)
MCV: 95.3 fL (ref 80.0–100.0)
Monocytes Absolute: 0.7 10*3/uL (ref 0.1–1.0)
Monocytes Relative: 8 %
Neutro Abs: 4.5 10*3/uL (ref 1.7–7.7)
Neutrophils Relative %: 51 %
Platelet Count: 282 10*3/uL (ref 150–400)
RBC: 4 MIL/uL — ABNORMAL LOW (ref 4.22–5.81)
RDW: 13 % (ref 11.5–15.5)
WBC Count: 8.9 10*3/uL (ref 4.0–10.5)
nRBC: 0 % (ref 0.0–0.2)

## 2022-01-20 LAB — LACTATE DEHYDROGENASE: LDH: 158 U/L (ref 98–192)

## 2022-01-20 MED ORDER — HEPARIN SODIUM (PORCINE) 1000 UNIT/ML IJ SOLN
1600.0000 [IU] | Freq: Once | INTRAMUSCULAR | Status: AC
  Administered 2022-01-20: 1600 [IU] via INTRAVENOUS
  Filled 2022-01-20: qty 1.6

## 2022-01-20 NOTE — Progress Notes (Signed)
[facility] OFFICE PROGRESS NOTE   Diagnosis: TTP  INTERVAL HISTORY:   Mr. Bourque returns as scheduled.  He continues prednisone 30 mg daily.  He continues [place].  He denies bleeding.  Some anxiety with the prednisone.  He reports a several day history of pain at the left popliteal fossa.  No injury.  No swelling.  Objective:  Vital signs in last 24 hours:  Blood pressure (!) 147/90, pulse 85, temperature 98.2 F (36.8 C), resp. rate 18, height '5\' 10"'$  (1.778 m), weight 193 lb 6.4 oz (87.7 kg), SpO2 100 %.    HEENT: No thrush or ulcers. Resp: Lungs clear bilaterally. Cardio: Regular rate and rhythm. GI: No hepatosplenomegaly. Vascular: No leg edema.  Calves soft and nontender. Musculoskeletal: Fullness with associated tenderness left superior popliteal fossa.  No erythema. Skin: A few ecchymoses scattered over the abdominal wall. Pheresis catheter site without erythema.  Lab Results:  Lab Results  Component Value Date   WBC 8.9 01/20/2022   HGB 12.4 (L) 01/20/2022   HCT 38.1 (L) 01/20/2022   MCV 95.3 01/20/2022   PLT 282 01/20/2022   NEUTROABS 4.5 01/20/2022    Imaging:  No results found.  Medications: I have reviewed the patient's current medications.  Assessment/Plan: TTP initially diagnosed in October 2018 with recurrence in April 2020 and July 2023. Initiation of daily plasma exchange and Solu-Medrol 02/02/2017 [name] Antibody- 72, activity less than 2% on initial presentation; [name] from 08/15/2018 <2.0. Last plasma exchange 02/06/2017 Prednisone 60 mg daily beginning 02/08/2017 Prednisone 40 mg daily beginning 02/17/2017 Prednisone 30 mg daily beginning 03/09/2017 Prednisone 20 mg daily beginning 03/23/2017 Prednisone 10 mg daily beginning 04/01/2017 Prednisone 5 mg daily for 5 days then 5 mg every other day for 5 doses then stop beginning 05/11/2017 Recurrent TTP 08/14/2018 Plasma exchange/steroids started on 08/15/2018.  Plasma  exchange discontinued after treatment on 08/19/2019, discharged on prednisone '60mg'$  daily Prednisone taper to 40 mg daily 08/22/2018 Weekly rituximab for 4 doses beginning 08/25/2018 Plasma exchange resumed 08/26/2018, 08/27/2018, 08/28/2018, 08/29/2018, 08/31/2018, 09/02/2018 Prednisone taper to 30 mg daily 09/01/2018 Cycle 3 weekly Rituxan 09/08/2018 Prednisone taper to 20 mg daily 09/08/2018 Cycle 4 weekly Rituxan 09/15/2018 Prednisone taper to 10 mg daily 09/20/2018 Prednisone taper to 5 mg daily 09/27/2018 Prednisone discontinued 10/07/2018 Admission with relapse of TTP 10/23/2021 Daily Plasma exchange initiated 11/23/2021 prednisone 8/1 Prednisone tapered to 40 mg daily beginning 12/04/2021 Weekly Rituxan for 4 doses beginning 12/04/2021 Discontinue plasmapheresis after appointment 12/05/2021 Admitted to [facility] 12/11/2021-plasmapheresis reinitiated, Cablivi started 12/12/2021, prednisone dose currently 80 mg daily Every other day plasma exchange beginning 12/28/2021-completed 01/09/2022 Second weekly Rituxan 12/31/2021 Prednisone taper to 60 mg daily beginning 12/31/2021 Third weekly Rituxan 01/06/2022 Prednisone taper to 40 mg daily beginning 01/07/2022 Fourth weekly rituximab 01/13/2022 Prednisone taper to 30 mg daily 01/13/2022 Prednisone taper to 20 mg daily 01/20/2022 2. Xiphoid pain-etiology unclear 3. Hypertension 4. Diabetes 5. Alcohol/Tobacco use 6. History of renal insufficiency 7. Right IJ dialysis catheter placed 02/02/2017, removed New right IJ dialysis catheter 08/15/2018 Catheter removed 09/12/2018 New right IJ dialysis catheter placed 11/23/2021 Temporary hemodialysis catheter converted to tunneled hemodialysis catheter 11/28/2021  Disposition: Mr. Hane appears stable.  Platelet count remains in normal range.  LDH and bilirubin stable in normal range.  He will taper prednisone to 20 mg daily.  Continue Cablivi for now.  We will follow-up on the ADAMTS activity from today.  He complains of recent pain  at the left popliteal fossa.  We are referring  him for a venous Doppler study.  He will return for lab and follow-up in approximately 1 week.  Tunneled pheresis catheter remains in place.  He will continue catheter flushes on the current schedule.    Ned Card ANP/GNP-BC   01/20/2022  10:41 AM

## 2022-01-22 ENCOUNTER — Inpatient Hospital Stay: Payer: PRIVATE HEALTH INSURANCE

## 2022-01-23 ENCOUNTER — Inpatient Hospital Stay: Payer: PRIVATE HEALTH INSURANCE

## 2022-01-23 DIAGNOSIS — M3119 Other thrombotic microangiopathy: Secondary | ICD-10-CM | POA: Diagnosis not present

## 2022-01-23 DIAGNOSIS — D696 Thrombocytopenia, unspecified: Secondary | ICD-10-CM

## 2022-01-23 DIAGNOSIS — Z452 Encounter for adjustment and management of vascular access device: Secondary | ICD-10-CM

## 2022-01-23 LAB — ADAMTS13 ANTIBODY: ADAMTS13 Antibody: 2 Units/mL (ref <12)

## 2022-01-23 MED ORDER — SODIUM CHLORIDE 0.9% FLUSH
10.0000 mL | Freq: Once | INTRAVENOUS | Status: AC
  Administered 2022-01-23: 10 mL

## 2022-01-23 MED ORDER — HEPARIN SODIUM (PORCINE) 1000 UNIT/ML DIALYSIS
1600.0000 [IU] | INTRAMUSCULAR | Status: DC | PRN
  Administered 2022-01-23: 1600 [IU]

## 2022-01-23 NOTE — Progress Notes (Signed)
Patient in for pheresis catheter flush and dressing change.  Per patient, dressing "fell off" yesterday but patient was able to reinforce dressing with tape.  Dressing was dry, intact with reinforced tape upon assessment.  Patient stated he has had issues with the last few dressings not staying in place.  Instructed patient to continue to reinforce dressing if he notices the edges are starting to come off and to also contact office if this occurs again.  Patient is scheduled for flush only on 01/27/22.  Instructed patient to let us know during that appointment if dressing is starting to come off. Patient verbalized understanding. All questions were answered during this visit.  Skin prep was used prior to the placement of today's dressing.  A larger tegaderm was also used.

## 2022-01-27 ENCOUNTER — Inpatient Hospital Stay: Payer: Commercial Managed Care - HMO | Attending: Oncology

## 2022-01-27 DIAGNOSIS — M3119 Other thrombotic microangiopathy: Secondary | ICD-10-CM | POA: Diagnosis not present

## 2022-01-27 DIAGNOSIS — Z452 Encounter for adjustment and management of vascular access device: Secondary | ICD-10-CM | POA: Insufficient documentation

## 2022-01-27 DIAGNOSIS — M79602 Pain in left arm: Secondary | ICD-10-CM | POA: Diagnosis not present

## 2022-01-27 DIAGNOSIS — I10 Essential (primary) hypertension: Secondary | ICD-10-CM | POA: Diagnosis not present

## 2022-01-27 DIAGNOSIS — D696 Thrombocytopenia, unspecified: Secondary | ICD-10-CM

## 2022-01-27 DIAGNOSIS — Z7952 Long term (current) use of systemic steroids: Secondary | ICD-10-CM | POA: Insufficient documentation

## 2022-01-27 DIAGNOSIS — E119 Type 2 diabetes mellitus without complications: Secondary | ICD-10-CM | POA: Insufficient documentation

## 2022-01-27 DIAGNOSIS — R531 Weakness: Secondary | ICD-10-CM | POA: Insufficient documentation

## 2022-01-27 MED ORDER — HEPARIN SODIUM (PORCINE) 1000 UNIT/ML DIALYSIS
1600.0000 [IU] | INTRAMUSCULAR | Status: DC | PRN
  Administered 2022-01-27: 1600 [IU]

## 2022-01-27 MED ORDER — SODIUM CHLORIDE 0.9% FLUSH
10.0000 mL | Freq: Once | INTRAVENOUS | Status: AC
  Administered 2022-01-27: 10 mL

## 2022-01-27 NOTE — Progress Notes (Signed)
Patient presented to infusion room for line flush.  Patient stated the bottom part of the dressing had fall off and he had reinforced it with tape.  Upon assessment, dressing was found intact with tape reinforcement on bottom of dressing. Whole dressing was changed, with extra skin prep added to skin prior to dressing application. Dressing clean, dry, intact prior to patient leaving infusion room.

## 2022-01-28 ENCOUNTER — Inpatient Hospital Stay (HOSPITAL_BASED_OUTPATIENT_CLINIC_OR_DEPARTMENT_OTHER): Payer: Commercial Managed Care - HMO | Admitting: Nurse Practitioner

## 2022-01-28 ENCOUNTER — Telehealth: Payer: Self-pay | Admitting: Family Medicine

## 2022-01-28 ENCOUNTER — Encounter: Payer: Self-pay | Admitting: Nurse Practitioner

## 2022-01-28 VITALS — BP 150/80 | HR 93 | Temp 98.2°F | Resp 18 | Ht 70.0 in | Wt 192.6 lb

## 2022-01-28 DIAGNOSIS — M3119 Other thrombotic microangiopathy: Secondary | ICD-10-CM

## 2022-01-28 NOTE — Telephone Encounter (Signed)
   Eric Hooper DOB: 08/15/1975 MRN: [id number]   RIDER WAIVER AND RELEASE OF LIABILITY  For purposes of improving physical access to our facilities, [facility] is pleased to partner with third parties to provide [facility] patients or other authorized individuals the option of convenient, on-demand ground transportation services (the [place]") through use of the technology service that enables users to request on-demand ground transportation from independent third-party providers.  By opting to use and accept these [employer], I, the undersigned, hereby agree on behalf of myself, and on behalf of any minor child using the Government social research officer for whom I am the parent or legal guardian, as follows:  Government social research officer provided to me are provided by independent third-party transportation providers who are not [employer] or employees and who are unaffiliated with [employer]. [facility] is neither a transportation carrier nor a common or public carrier. [facility] has no control over the quality or safety of the transportation that occurs as a result of the [employer]. [facility] cannot guarantee that any third-party transportation provider will complete any arranged transportation service. [facility] makes no representation, warranty, or guarantee regarding the reliability, timeliness, quality, safety, suitability, or availability of any of the Transport Services or that they will be error free. I fully understand that traveling by vehicle involves risks and dangers of serious bodily injury, including permanent disability, paralysis, and death. I agree, on behalf of myself and on behalf of any minor child using the Transport Services for whom I am the parent or legal guardian, that the entire risk arising out of my use of the [employer] remains solely with me, to the maximum extent permitted under applicable law. The [employer] are provided "as  is" and "as available." [facility] disclaims all representations and warranties, express, implied or statutory, not expressly set out in these terms, including the implied warranties of merchantability and fitness for a particular purpose. I hereby waive and release [facility], its agents, employees, officers, directors, representatives, insurers, attorneys, assigns, successors, subsidiaries, and affiliates from any and all past, present, or future claims, demands, liabilities, actions, causes of action, or suits of any kind directly or indirectly arising from acceptance and use of the [employer]. I further waive and release [facility] and its affiliates from all present and future liability and responsibility for any injury or death to persons or damages to property caused by or related to the use of the [employer]. I have read this Waiver and Release of Liability, and I understand the terms used in it and their legal significance. This Waiver is freely and voluntarily given with the understanding that my right (as well as the right of any minor child for whom I am the parent or legal guardian using the [employer]) to legal recourse against  in connection with the [employer] is knowingly surrendered in return for use of these services.   I attest that I read the consent document to Eric Hooper, gave Eric Hooper the opportunity to ask questions and answered the questions asked (if any). I affirm that Eric Hooper then provided consent for he's participation in this program.     Eric Hooper

## 2022-01-28 NOTE — Progress Notes (Signed)
[facility] OFFICE PROGRESS NOTE   Diagnosis: TTP  INTERVAL HISTORY:   Mr. Jelley returns prior to scheduled follow-up for evaluation of bilateral leg weakness.  Prednisone was tapered to 20 mg daily beginning 01/20/2022.  At his office visit last week he complained of pain at the left popliteal fossa.  Venous Doppler negative for DVT.  He reports persistent "achy" pain at the left popliteal fossa.  No swelling.  He feels both legs are weak.  This is especially noticeable when he tries to climb stairs.  He denies back pain.  No bowel or bladder dysfunction.  Objective:  Vital signs in last 24 hours:  Blood pressure (!) 150/80, pulse 93, temperature 98.2 F (36.8 C), temperature source Oral, resp. rate 18, height '5\' 10"'$  (9.509 m), weight 192 lb 9.6 oz (87.4 kg), SpO2 100 %.    HEENT: No thrush. Resp: Lungs clear bilaterally. Cardio: Regular rate and rhythm. GI: No hepatosplenomegaly. Vascular: No leg edema. Neuro: Alert and oriented.  Motor strength 5/5.  Knee DTRs 2+, symmetric. Skin: No rash. Musculoskeletal: Full appearance of the left popliteal fossa.  Question small left knee effusion.   Lab Results:  Lab Results  Component Value Date   WBC 8.9 01/20/2022   HGB 12.4 (L) 01/20/2022   HCT 38.1 (L) 01/20/2022   MCV 95.3 01/20/2022   PLT 282 01/20/2022   NEUTROABS 4.5 01/20/2022    Imaging:  No results found.  Medications: I have reviewed the patient's current medications.  Assessment/Plan: TTP initially diagnosed in October 2018 with recurrence in April 2020 and July 2023. Initiation of daily plasma exchange and Solu-Medrol 02/02/2017 [name] Antibody- 72, activity less than 2% on initial presentation; [name] from 08/15/2018 <2.0. Last plasma exchange 02/06/2017 Prednisone 60 mg daily beginning 02/08/2017 Prednisone 40 mg daily beginning 02/17/2017 Prednisone 30 mg daily beginning 03/09/2017 Prednisone 20 mg daily beginning  03/23/2017 Prednisone 10 mg daily beginning 04/01/2017 Prednisone 5 mg daily for 5 days then 5 mg every other day for 5 doses then stop beginning 05/11/2017 Recurrent TTP 08/14/2018 Plasma exchange/steroids started on 08/15/2018.  Plasma exchange discontinued after treatment on 08/19/2019, discharged on prednisone '60mg'$  daily Prednisone taper to 40 mg daily 08/22/2018 Weekly rituximab for 4 doses beginning 08/25/2018 Plasma exchange resumed 08/26/2018, 08/27/2018, 08/28/2018, 08/29/2018, 08/31/2018, 09/02/2018 Prednisone taper to 30 mg daily 09/01/2018 Cycle 3 weekly Rituxan 09/08/2018 Prednisone taper to 20 mg daily 09/08/2018 Cycle 4 weekly Rituxan 09/15/2018 Prednisone taper to 10 mg daily 09/20/2018 Prednisone taper to 5 mg daily 09/27/2018 Prednisone discontinued 10/07/2018 Admission with relapse of TTP 10/23/2021 Daily Plasma exchange initiated 11/23/2021 prednisone 8/1 Prednisone tapered to 40 mg daily beginning 12/04/2021 Weekly Rituxan for 4 doses beginning 12/04/2021 Discontinue plasmapheresis after appointment 12/05/2021 Admitted to [facility] 12/11/2021-plasmapheresis reinitiated, Cablivi started 12/12/2021, prednisone dose currently 80 mg daily Every other day plasma exchange beginning 12/28/2021-completed 01/09/2022 Second weekly Rituxan 12/31/2021 Prednisone taper to 60 mg daily beginning 12/31/2021 Third weekly Rituxan 01/06/2022 Prednisone taper to 40 mg daily beginning 01/07/2022 Fourth weekly rituximab 01/13/2022 Prednisone taper to 30 mg daily 01/13/2022 Prednisone taper to 20 mg daily 01/20/2022 2. Xiphoid pain-etiology unclear 3. Hypertension 4. Diabetes 5. Alcohol/Tobacco use 6. History of renal insufficiency 7. Right IJ dialysis catheter placed 02/02/2017, removed New right IJ dialysis catheter 08/15/2018 Catheter removed 09/12/2018 New right IJ dialysis catheter placed 11/23/2021 Temporary hemodialysis catheter converted to tunneled hemodialysis catheter 11/28/2021    Disposition: Mr. Ratajczak appears  stable.  He is currently on prednisone 20 mg daily.  He will return for scheduled labs 01/29/2022.  We will taper prednisone pending those results.  The "leg weakness" may be steroid myopathy.  No perceptible weakness on exam.  Etiology of the pain at the left popliteal fossa is unclear.  Negative venous Doppler last week.  He may have a small left knee effusion.  Plan to monitor for now, additional evaluation if the pain persists.  He will return for a catheter flush and blood work tomorrow.  We will see him in follow-up with labs on 02/03/2022.  He will contact the office in the interim with any problems.  Patient seen with Dr. Benay Spice.    Ned Card ANP/GNP-BC   01/28/2022  10:11 AM  This was a shared visit with Ned Card.  Mr. Hass was interviewed and examined.  He complains of pain at the left popliteal fossa.  Examination of the left leg is unremarkable.  Suspect the pain is related to a benign musculoskeletal condition.  He reports leg weakness.  No weakness is appreciated on exam today.  He may have steroid myopathy.  The TTP is in clinical remission.  We will follow-up on labs scheduled for tomorrow.  We will taper the prednisone further if the platelet count is stable.  The ADAMTS antibody level is normal and the activity was stable last week.  I was present greater than 50% of today's visit.  I performed medical decision making.  Julieanne Manson, MD

## 2022-01-29 ENCOUNTER — Inpatient Hospital Stay: Payer: Commercial Managed Care - HMO

## 2022-01-29 ENCOUNTER — Inpatient Hospital Stay: Payer: Commercial Managed Care - HMO | Admitting: Nurse Practitioner

## 2022-01-29 DIAGNOSIS — D696 Thrombocytopenia, unspecified: Secondary | ICD-10-CM

## 2022-01-29 DIAGNOSIS — M3119 Other thrombotic microangiopathy: Secondary | ICD-10-CM

## 2022-01-29 DIAGNOSIS — Z452 Encounter for adjustment and management of vascular access device: Secondary | ICD-10-CM

## 2022-01-29 LAB — CMP (CANCER CENTER ONLY)
ALT: 26 U/L (ref 0–44)
AST: 14 U/L — ABNORMAL LOW (ref 15–41)
Albumin: 4.1 g/dL (ref 3.5–5.0)
Alkaline Phosphatase: 50 U/L (ref 38–126)
Anion gap: 11 (ref 5–15)
BUN: 19 mg/dL (ref 6–20)
CO2: 26 mmol/L (ref 22–32)
Calcium: 9.3 mg/dL (ref 8.9–10.3)
Chloride: 106 mmol/L (ref 98–111)
Creatinine: 0.88 mg/dL (ref 0.61–1.24)
GFR, Estimated: >60 mL/min (ref >60)
Glucose, Bld: 120 mg/dL — ABNORMAL HIGH (ref 70–99)
Potassium: 3.4 mmol/L — ABNORMAL LOW (ref 3.5–5.1)
Sodium: 143 mmol/L (ref 135–145)
Total Bilirubin: 0.3 mg/dL (ref 0.3–1.2)
Total Protein: 6.4 g/dL — ABNORMAL LOW (ref 6.5–8.1)

## 2022-01-29 LAB — CBC WITH DIFFERENTIAL (CANCER CENTER ONLY)
Abs Immature Granulocytes: 0.03 10*3/uL (ref 0.00–0.07)
Basophils Absolute: 0 10*3/uL (ref 0.0–0.1)
Basophils Relative: 0 %
Eosinophils Absolute: 0.1 10*3/uL (ref 0.0–0.5)
Eosinophils Relative: 1 %
HCT: 37.2 % — ABNORMAL LOW (ref 39.0–52.0)
Hemoglobin: 12.1 g/dL — ABNORMAL LOW (ref 13.0–17.0)
Immature Granulocytes: 0 %
Lymphocytes Relative: 51 %
Lymphs Abs: 5.1 10*3/uL — ABNORMAL HIGH (ref 0.7–4.0)
MCH: 30.5 pg (ref 26.0–34.0)
MCHC: 32.5 g/dL (ref 30.0–36.0)
MCV: 93.7 fL (ref 80.0–100.0)
Monocytes Absolute: 0.7 10*3/uL (ref 0.1–1.0)
Monocytes Relative: 7 %
Neutro Abs: 4.1 10*3/uL (ref 1.7–7.7)
Neutrophils Relative %: 41 %
Platelet Count: 253 10*3/uL (ref 150–400)
RBC: 3.97 MIL/uL — ABNORMAL LOW (ref 4.22–5.81)
RDW: 13.3 % (ref 11.5–15.5)
WBC Count: 10 10*3/uL (ref 4.0–10.5)
nRBC: 0 % (ref 0.0–0.2)

## 2022-01-29 LAB — LACTATE DEHYDROGENASE: LDH: 190 U/L (ref 98–192)

## 2022-01-29 MED ORDER — SODIUM CHLORIDE 0.9% FLUSH
10.0000 mL | Freq: Once | INTRAVENOUS | Status: AC
  Administered 2022-01-29: 10 mL

## 2022-01-29 MED ORDER — ALTEPLASE 2 MG IJ SOLR: 2.0000 mg | Freq: Once | INTRAMUSCULAR | Status: DC

## 2022-01-29 MED ORDER — HEPARIN SODIUM (PORCINE) 1000 UNIT/ML DIALYSIS: 1600.0000 [IU] | INTRAMUSCULAR | Status: DC | PRN

## 2022-01-29 MED ORDER — HEPARIN SODIUM (PORCINE) 1000 UNIT/ML IJ SOLN
1600.0000 [IU] | Freq: Once | INTRAMUSCULAR | Status: AC
  Administered 2022-01-29: 1600 [IU] via INTRAVENOUS
  Filled 2022-01-29: qty 1.6

## 2022-01-30 ENCOUNTER — Telehealth: Payer: Self-pay

## 2022-01-30 NOTE — Telephone Encounter (Signed)
-----   Message from Owens Shark, NP sent at 01/30/2022  2:55 PM EDT ----- Please let him know to continue the same dose of prednisone for now.  Follow-up as scheduled.  Plan to taper further next week if labs remain stable.

## 2022-01-30 NOTE — Telephone Encounter (Signed)
Patient gave verbal understanding and had no further questions or concerns at this time 

## 2022-02-01 LAB — ADAMTS13 ACTIVITY
Adamts 13 Activity: 21.2 % (ref >66.8)
Adamts 13 Activity: 24.2 % (ref >66.8)

## 2022-02-03 ENCOUNTER — Inpatient Hospital Stay: Payer: Commercial Managed Care - HMO

## 2022-02-03 ENCOUNTER — Other Ambulatory Visit: Payer: Self-pay | Admitting: Nurse Practitioner

## 2022-02-03 ENCOUNTER — Telehealth: Payer: Self-pay

## 2022-02-03 ENCOUNTER — Other Ambulatory Visit: Payer: Self-pay | Admitting: *Deleted

## 2022-02-03 DIAGNOSIS — D696 Thrombocytopenia, unspecified: Secondary | ICD-10-CM

## 2022-02-03 DIAGNOSIS — M3119 Other thrombotic microangiopathy: Secondary | ICD-10-CM | POA: Diagnosis not present

## 2022-02-03 DIAGNOSIS — Z452 Encounter for adjustment and management of vascular access device: Secondary | ICD-10-CM

## 2022-02-03 LAB — CBC WITH DIFFERENTIAL (CANCER CENTER ONLY)
Abs Immature Granulocytes: 0.03 10*3/uL (ref 0.00–0.07)
Basophils Absolute: 0 10*3/uL (ref 0.0–0.1)
Basophils Relative: 0 %
Eosinophils Absolute: 0.1 10*3/uL (ref 0.0–0.5)
Eosinophils Relative: 1 %
HCT: 40.5 % (ref 39.0–52.0)
Hemoglobin: 13.3 g/dL (ref 13.0–17.0)
Immature Granulocytes: 0 %
Lymphocytes Relative: 54 %
Lymphs Abs: 5.3 10*3/uL — ABNORMAL HIGH (ref 0.7–4.0)
MCH: 29.8 pg (ref 26.0–34.0)
MCHC: 32.8 g/dL (ref 30.0–36.0)
MCV: 90.6 fL (ref 80.0–100.0)
Monocytes Absolute: 0.7 10*3/uL (ref 0.1–1.0)
Monocytes Relative: 7 %
Neutro Abs: 3.8 10*3/uL (ref 1.7–7.7)
Neutrophils Relative %: 38 %
Platelet Count: 208 10*3/uL (ref 150–400)
RBC: 4.47 MIL/uL (ref 4.22–5.81)
RDW: 13.7 % (ref 11.5–15.5)
WBC Count: 10 10*3/uL (ref 4.0–10.5)
nRBC: 0 % (ref 0.0–0.2)

## 2022-02-03 LAB — LACTATE DEHYDROGENASE: LDH: 176 U/L (ref 98–192)

## 2022-02-03 LAB — CMP (CANCER CENTER ONLY)
ALT: 30 U/L (ref 0–44)
AST: 15 U/L (ref 15–41)
Albumin: 4.3 g/dL (ref 3.5–5.0)
Alkaline Phosphatase: 52 U/L (ref 38–126)
Anion gap: 11 (ref 5–15)
BUN: 18 mg/dL (ref 6–20)
CO2: 27 mmol/L (ref 22–32)
Calcium: 10.3 mg/dL (ref 8.9–10.3)
Chloride: 104 mmol/L (ref 98–111)
Creatinine: 1.14 mg/dL (ref 0.61–1.24)
GFR, Estimated: >60 mL/min (ref >60)
Glucose, Bld: 171 mg/dL — ABNORMAL HIGH (ref 70–99)
Potassium: 3.2 mmol/L — ABNORMAL LOW (ref 3.5–5.1)
Sodium: 142 mmol/L (ref 135–145)
Total Bilirubin: 0.5 mg/dL (ref 0.3–1.2)
Total Protein: 7.1 g/dL (ref 6.5–8.1)

## 2022-02-03 MED ORDER — SODIUM CHLORIDE 0.9% FLUSH
10.0000 mL | Freq: Once | INTRAVENOUS | Status: AC
  Administered 2022-02-03: 10 mL

## 2022-02-03 MED ORDER — POTASSIUM CHLORIDE CRYS ER 20 MEQ PO TBCR: 20.0000 meq | EXTENDED_RELEASE_TABLET | Freq: Every day | ORAL | 1 refills | Status: DC

## 2022-02-03 MED ORDER — HEPARIN SODIUM (PORCINE) 1000 UNIT/ML DIALYSIS
1600.0000 [IU] | INTRAMUSCULAR | Status: DC | PRN
  Administered 2022-02-03: 1600 [IU]

## 2022-02-03 NOTE — Telephone Encounter (Signed)
I called [name] to speak with him about restart his glipizide. Per Eric Hooper she talked to Dr Eric Hooper recommends [name] f/u with PCP regarding diabetes management. Patient gave verbal understanding and had no further questions or concerns at this time.

## 2022-02-04 ENCOUNTER — Telehealth: Payer: Self-pay

## 2022-02-04 LAB — ADAMTS13 ACTIVITY REFLEX

## 2022-02-04 LAB — ADAMTS13 ACTIVITY: Adamts 13 Activity: 35.2 % — ABNORMAL LOW (ref >66.8)

## 2022-02-04 NOTE — Telephone Encounter (Signed)
Patient gave verbal understanding had no further questions or concerns. 

## 2022-02-04 NOTE — Telephone Encounter (Signed)
-----   Message from Owens Shark, NP sent at 02/03/2022  4:58 PM EDT ----- Please let him know labs look good---taper prednisone to 15 mg daily

## 2022-02-04 NOTE — Telephone Encounter (Signed)
Patient gave verbal understanding and had no further questions or concerns  

## 2022-02-04 NOTE — Telephone Encounter (Signed)
-----   Message from Owens Shark, NP sent at 02/04/2022  9:17 AM EDT ----- I spoke again with Dr. Benay Spice about the prednisone taper, he recommends decreasing prednisone to 15 mg daily based on recent labs.  If he is more comfortable continuing 20 mg daily until the ADAMTS activity level from yesterday is available, that is fine also.  Please let him know potassium level is mildly decreased.  I sent a prescription to his pharmacy yesterday.

## 2022-02-05 ENCOUNTER — Inpatient Hospital Stay: Payer: Commercial Managed Care - HMO

## 2022-02-05 VITALS — BP 139/97 | HR 83 | Temp 98.1°F | Resp 18

## 2022-02-05 DIAGNOSIS — D696 Thrombocytopenia, unspecified: Secondary | ICD-10-CM

## 2022-02-05 DIAGNOSIS — M3119 Other thrombotic microangiopathy: Secondary | ICD-10-CM | POA: Diagnosis not present

## 2022-02-05 DIAGNOSIS — Z452 Encounter for adjustment and management of vascular access device: Secondary | ICD-10-CM

## 2022-02-05 MED ORDER — HEPARIN SODIUM (PORCINE) 1000 UNIT/ML DIALYSIS
1600.0000 [IU] | Freq: Once | INTRAMUSCULAR | Status: AC
  Administered 2022-02-05: 1600 [IU]
  Filled 2022-02-05: qty 1.6

## 2022-02-05 MED ORDER — SODIUM CHLORIDE 0.9% FLUSH
10.0000 mL | Freq: Once | INTRAVENOUS | Status: AC
  Administered 2022-02-05: 10 mL

## 2022-02-05 MED ORDER — SODIUM CHLORIDE 0.9% FLUSH
10.0000 mL | Freq: Once | INTRAVENOUS | Status: AC
  Administered 2022-02-05: 10 mL via INTRAVENOUS

## 2022-02-06 ENCOUNTER — Telehealth: Payer: Self-pay

## 2022-02-06 NOTE — Telephone Encounter (Signed)
Patients significant other calls nurse line requesting to speak to PCP.   She reports he was seen in the hospital and told to stop Glipizide. She reports they are unsure if he is supposed to resume this medication.   Will forward to PCP.   I will schedule him and apt when I call them back.

## 2022-02-06 NOTE — Telephone Encounter (Signed)
Called patient.   He reports he is still on steroids and will continue to take Glipizide.   Patient scheduled for 10/30 with PCP.

## 2022-02-08 ENCOUNTER — Other Ambulatory Visit: Payer: Self-pay | Admitting: Family Medicine

## 2022-02-08 DIAGNOSIS — E119 Type 2 diabetes mellitus without complications: Secondary | ICD-10-CM

## 2022-02-08 DIAGNOSIS — E782 Mixed hyperlipidemia: Secondary | ICD-10-CM

## 2022-02-08 DIAGNOSIS — I10 Essential (primary) hypertension: Secondary | ICD-10-CM

## 2022-02-09 ENCOUNTER — Other Ambulatory Visit: Payer: Self-pay | Admitting: Family Medicine

## 2022-02-09 DIAGNOSIS — I10 Essential (primary) hypertension: Secondary | ICD-10-CM

## 2022-02-10 ENCOUNTER — Inpatient Hospital Stay: Payer: Commercial Managed Care - HMO

## 2022-02-10 DIAGNOSIS — D696 Thrombocytopenia, unspecified: Secondary | ICD-10-CM

## 2022-02-10 DIAGNOSIS — M3119 Other thrombotic microangiopathy: Secondary | ICD-10-CM | POA: Diagnosis not present

## 2022-02-10 DIAGNOSIS — Z452 Encounter for adjustment and management of vascular access device: Secondary | ICD-10-CM

## 2022-02-10 LAB — CMP (CANCER CENTER ONLY)
ALT: 28 U/L (ref 0–44)
AST: 13 U/L — ABNORMAL LOW (ref 15–41)
Albumin: 4.5 g/dL (ref 3.5–5.0)
Alkaline Phosphatase: 54 U/L (ref 38–126)
Anion gap: 12 (ref 5–15)
BUN: 18 mg/dL (ref 6–20)
CO2: 25 mmol/L (ref 22–32)
Calcium: 10.3 mg/dL (ref 8.9–10.3)
Chloride: 107 mmol/L (ref 98–111)
Creatinine: 0.92 mg/dL (ref 0.61–1.24)
GFR, Estimated: >60 mL/min (ref >60)
Glucose, Bld: 194 mg/dL — ABNORMAL HIGH (ref 70–99)
Potassium: 3.6 mmol/L (ref 3.5–5.1)
Sodium: 144 mmol/L (ref 135–145)
Total Bilirubin: 0.4 mg/dL (ref 0.3–1.2)
Total Protein: 6.9 g/dL (ref 6.5–8.1)

## 2022-02-10 LAB — CBC WITH DIFFERENTIAL (CANCER CENTER ONLY)
Abs Immature Granulocytes: 0 10*3/uL (ref 0.00–0.07)
Basophils Absolute: 0 10*3/uL (ref 0.0–0.1)
Basophils Relative: 0 %
Eosinophils Absolute: 0.1 10*3/uL (ref 0.0–0.5)
Eosinophils Relative: 1 %
HCT: 42.6 % (ref 39.0–52.0)
Hemoglobin: 13.9 g/dL (ref 13.0–17.0)
Lymphocytes Relative: 48 %
Lymphs Abs: 5.4 10*3/uL — ABNORMAL HIGH (ref 0.7–4.0)
MCH: 29.4 pg (ref 26.0–34.0)
MCHC: 32.6 g/dL (ref 30.0–36.0)
MCV: 90.3 fL (ref 80.0–100.0)
Monocytes Absolute: 0.7 10*3/uL (ref 0.1–1.0)
Monocytes Relative: 6 %
Neutro Abs: 5 10*3/uL (ref 1.7–7.7)
Neutrophils Relative %: 45 %
Platelet Count: 206 10*3/uL (ref 150–400)
RBC: 4.72 MIL/uL (ref 4.22–5.81)
RDW: 13.7 % (ref 11.5–15.5)
WBC Count: 11.2 10*3/uL — ABNORMAL HIGH (ref 4.0–10.5)
nRBC: 0 % (ref 0.0–0.2)

## 2022-02-10 LAB — LACTATE DEHYDROGENASE: LDH: 187 U/L (ref 98–192)

## 2022-02-10 MED ORDER — HEPARIN SODIUM (PORCINE) 1000 UNIT/ML DIALYSIS
1600.0000 [IU] | INTRAMUSCULAR | Status: DC | PRN
  Administered 2022-02-10: 1600 [IU]

## 2022-02-10 MED ORDER — SODIUM CHLORIDE 0.9% FLUSH
10.0000 mL | Freq: Once | INTRAVENOUS | Status: AC
  Administered 2022-02-10: 10 mL

## 2022-02-11 LAB — ADAMTS13 ACTIVITY: Adamts 13 Activity: 42.4 % — ABNORMAL LOW (ref >66.8)

## 2022-02-11 LAB — ADAMTS13 ACTIVITY REFLEX

## 2022-02-12 ENCOUNTER — Inpatient Hospital Stay: Payer: Commercial Managed Care - HMO

## 2022-02-12 VITALS — BP 144/95 | HR 89 | Temp 98.1°F | Resp 18

## 2022-02-12 DIAGNOSIS — Z452 Encounter for adjustment and management of vascular access device: Secondary | ICD-10-CM

## 2022-02-12 DIAGNOSIS — M3119 Other thrombotic microangiopathy: Secondary | ICD-10-CM | POA: Diagnosis not present

## 2022-02-12 DIAGNOSIS — D696 Thrombocytopenia, unspecified: Secondary | ICD-10-CM

## 2022-02-12 MED ORDER — HEPARIN SODIUM (PORCINE) 1000 UNIT/ML DIALYSIS
1600.0000 [IU] | INTRAMUSCULAR | Status: DC | PRN
  Administered 2022-02-12: 1600 [IU]

## 2022-02-12 MED ORDER — SODIUM CHLORIDE 0.9% FLUSH
10.0000 mL | Freq: Once | INTRAVENOUS | Status: AC
  Administered 2022-02-12: 10 mL

## 2022-02-12 NOTE — Patient Instructions (Signed)

## 2022-02-13 ENCOUNTER — Inpatient Hospital Stay: Payer: Commercial Managed Care - HMO

## 2022-02-17 ENCOUNTER — Inpatient Hospital Stay: Payer: Commercial Managed Care - HMO

## 2022-02-18 ENCOUNTER — Other Ambulatory Visit: Payer: Self-pay | Admitting: *Deleted

## 2022-02-18 DIAGNOSIS — D696 Thrombocytopenia, unspecified: Secondary | ICD-10-CM

## 2022-02-18 DIAGNOSIS — M3119 Other thrombotic microangiopathy: Secondary | ICD-10-CM

## 2022-02-19 ENCOUNTER — Inpatient Hospital Stay: Payer: Commercial Managed Care - HMO

## 2022-02-19 ENCOUNTER — Other Ambulatory Visit: Payer: PRIVATE HEALTH INSURANCE

## 2022-02-19 ENCOUNTER — Encounter: Payer: Self-pay | Admitting: Nurse Practitioner

## 2022-02-19 ENCOUNTER — Inpatient Hospital Stay (HOSPITAL_BASED_OUTPATIENT_CLINIC_OR_DEPARTMENT_OTHER): Payer: Commercial Managed Care - HMO | Admitting: Nurse Practitioner

## 2022-02-19 VITALS — BP 140/80 | HR 91 | Temp 98.2°F | Resp 18 | Ht 70.0 in | Wt 194.4 lb

## 2022-02-19 DIAGNOSIS — M3119 Other thrombotic microangiopathy: Secondary | ICD-10-CM | POA: Diagnosis not present

## 2022-02-19 DIAGNOSIS — Z452 Encounter for adjustment and management of vascular access device: Secondary | ICD-10-CM

## 2022-02-19 DIAGNOSIS — D696 Thrombocytopenia, unspecified: Secondary | ICD-10-CM

## 2022-02-19 LAB — CMP (CANCER CENTER ONLY)
ALT: 24 U/L (ref 0–44)
AST: 11 U/L — ABNORMAL LOW (ref 15–41)
Albumin: 4.1 g/dL (ref 3.5–5.0)
Alkaline Phosphatase: 47 U/L (ref 38–126)
Anion gap: 9 (ref 5–15)
BUN: 21 mg/dL — ABNORMAL HIGH (ref 6–20)
CO2: 27 mmol/L (ref 22–32)
Calcium: 10 mg/dL (ref 8.9–10.3)
Chloride: 105 mmol/L (ref 98–111)
Creatinine: 1.03 mg/dL (ref 0.61–1.24)
GFR, Estimated: >60 mL/min (ref >60)
Glucose, Bld: 161 mg/dL — ABNORMAL HIGH (ref 70–99)
Potassium: 3.6 mmol/L (ref 3.5–5.1)
Sodium: 141 mmol/L (ref 135–145)
Total Bilirubin: 0.4 mg/dL (ref 0.3–1.2)
Total Protein: 6.4 g/dL — ABNORMAL LOW (ref 6.5–8.1)

## 2022-02-19 LAB — CBC WITH DIFFERENTIAL (CANCER CENTER ONLY)
Abs Immature Granulocytes: 0.03 10*3/uL (ref 0.00–0.07)
Basophils Absolute: 0 10*3/uL (ref 0.0–0.1)
Basophils Relative: 0 %
Eosinophils Absolute: 0.1 10*3/uL (ref 0.0–0.5)
Eosinophils Relative: 1 %
HCT: 41.9 % (ref 39.0–52.0)
Hemoglobin: 13.6 g/dL (ref 13.0–17.0)
Immature Granulocytes: 0 %
Lymphocytes Relative: 41 %
Lymphs Abs: 4.2 10*3/uL — ABNORMAL HIGH (ref 0.7–4.0)
MCH: 28.9 pg (ref 26.0–34.0)
MCHC: 32.5 g/dL (ref 30.0–36.0)
MCV: 89.1 fL (ref 80.0–100.0)
Monocytes Absolute: 0.9 10*3/uL (ref 0.1–1.0)
Monocytes Relative: 9 %
Neutro Abs: 5.1 10*3/uL (ref 1.7–7.7)
Neutrophils Relative %: 49 %
Platelet Count: 222 10*3/uL (ref 150–400)
RBC: 4.7 MIL/uL (ref 4.22–5.81)
RDW: 13.6 % (ref 11.5–15.5)
WBC Count: 10.4 10*3/uL (ref 4.0–10.5)
nRBC: 0 % (ref 0.0–0.2)

## 2022-02-19 MED ORDER — HEPARIN SODIUM (PORCINE) 1000 UNIT/ML DIALYSIS
1600.0000 [IU] | INTRAMUSCULAR | Status: DC | PRN
  Administered 2022-02-19: 1600 [IU]

## 2022-02-19 MED ORDER — SODIUM CHLORIDE 0.9% FLUSH: 10.0000 mL | Freq: Once | INTRAVENOUS | Status: DC

## 2022-02-19 NOTE — Progress Notes (Signed)
[place] OFFICE PROGRESS NOTE   Diagnosis: TTP  INTERVAL HISTORY:   Eric Hooper returns as scheduled.  He was instructed to taper prednisone to 15 mg daily beginning 02/04/2022.  Unfortunately he did not relay this information to his wife who helps him with his medications.  He has been taking prednisone at a dose of 30 mg daily since 01/13/2022.  He denies bleeding.  He continues to note a change in visual acuity.  He has persistent bilateral leg weakness and achy discomfort at the left popliteal fossa.  He notes pain at the left antecubital region when he straightens his arm.  There is no arm or leg swelling.  Objective:  Vital signs in last 24 hours:  Blood pressure (!) 140/80, pulse 91, temperature 98.2 F (36.8 C), temperature source Oral, resp. rate 18, height '5\' 10"'$  (1.778 m), weight 194 lb 6.4 oz (88.2 kg), SpO2 100 %.    HEENT: No thrush or ulcers. Resp: Lungs clear bilaterally. Cardio: Regular rate and rhythm. GI: Abdomen soft and nontender.  No hepatosplenomegaly. Vascular: No leg edema.  No arm edema. Neuro: Alert and oriented. Skin: No rash. Musculoskeletal: No obvious abnormality at the left antecubital region or left popliteal fossa. Pheresis catheter at the right chest is without erythema.  Lab Results:  Lab Results  Component Value Date   WBC 10.4 02/19/2022   HGB 13.6 02/19/2022   HCT 41.9 02/19/2022   MCV 89.1 02/19/2022   PLT 222 02/19/2022   NEUTROABS 5.1 02/19/2022    Imaging:  No results found.  Medications: I have reviewed the patient's current medications.  Assessment/Plan: TTP initially diagnosed in October 2018 with recurrence in April 2020 and July 2023. Initiation of daily plasma exchange and Solu-Medrol 02/02/2017 [name] Antibody- 72, activity less than 2% on initial presentation; [name] from 08/15/2018 <2.0. Last plasma exchange 02/06/2017 Prednisone 60 mg daily beginning 02/08/2017 Prednisone 40 mg daily  beginning 02/17/2017 Prednisone 30 mg daily beginning 03/09/2017 Prednisone 20 mg daily beginning 03/23/2017 Prednisone 10 mg daily beginning 04/01/2017 Prednisone 5 mg daily for 5 days then 5 mg every other day for 5 doses then stop beginning 05/11/2017 Recurrent TTP 08/14/2018 Plasma exchange/steroids started on 08/15/2018.  Plasma exchange discontinued after treatment on 08/19/2019, discharged on prednisone '60mg'$  daily Prednisone taper to 40 mg daily 08/22/2018 Weekly rituximab for 4 doses beginning 08/25/2018 Plasma exchange resumed 08/26/2018, 08/27/2018, 08/28/2018, 08/29/2018, 08/31/2018, 09/02/2018 Prednisone taper to 30 mg daily 09/01/2018 Cycle 3 weekly Rituxan 09/08/2018 Prednisone taper to 20 mg daily 09/08/2018 Cycle 4 weekly Rituxan 09/15/2018 Prednisone taper to 10 mg daily 09/20/2018 Prednisone taper to 5 mg daily 09/27/2018 Prednisone discontinued 10/07/2018 Admission with relapse of TTP 10/23/2021 Daily Plasma exchange initiated 11/23/2021 prednisone 8/1 Prednisone tapered to 40 mg daily beginning 12/04/2021 Weekly Rituxan for 4 doses beginning 12/04/2021 Discontinue plasmapheresis after appointment 12/05/2021 Admitted to [facility] 12/11/2021-plasmapheresis reinitiated, Cablivi started 12/12/2021, prednisone dose currently 80 mg daily Every other day plasma exchange beginning 12/28/2021-completed 01/09/2022 Second weekly Rituxan 12/31/2021 Prednisone taper to 60 mg daily beginning 12/31/2021 Third weekly Rituxan 01/06/2022 Prednisone taper to 40 mg daily beginning 01/07/2022 Fourth weekly rituximab 01/13/2022 Prednisone taper to 30 mg daily 01/13/2022 Prednisone taper to 20 mg daily 01/20/2022 Prednisone taper to 15 mg daily 02/04/2022 Per patient report 02/19/2022 he has continued prednisone 30 mg daily and did not make the above adjustments, he will decrease prednisone to 20 mg daily today 2. Xiphoid pain-etiology unclear 3. Hypertension 4. Diabetes 5. Alcohol/Tobacco use 6. History  of renal  insufficiency 7. Right IJ dialysis catheter placed 02/02/2017, removed New right IJ dialysis catheter 08/15/2018 Catheter removed 09/12/2018 New right IJ dialysis catheter placed 11/23/2021 Temporary hemodialysis catheter converted to tunneled hemodialysis catheter 11/28/2021    Disposition: Mr. Willhite appears stable.  Review of the CBC shows platelet count and hemoglobin stable in normal range.  He unfortunately has not tapered prednisone since he was began 30 mg daily on 01/13/2022.  He will taper to 20 mg daily beginning today.  He will return for follow-up labs in 2 weeks which will include ADAMTS 13 activity level.  He understands the leg weakness may be steroid myopathy.  Hopefully this will improve as we taper him off of prednisone.  We are referring him for removal of the pheresis catheter.  He will return for an office visit in 4 weeks.  We are available to see him sooner if needed.    Ned Card ANP/GNP-BC   02/19/2022  11:53 AM

## 2022-02-20 ENCOUNTER — Other Ambulatory Visit: Payer: Self-pay | Admitting: *Deleted

## 2022-02-20 ENCOUNTER — Other Ambulatory Visit: Payer: Self-pay | Admitting: Nurse Practitioner

## 2022-02-20 DIAGNOSIS — M3119 Other thrombotic microangiopathy: Secondary | ICD-10-CM

## 2022-02-20 MED ORDER — SULFAMETHOXAZOLE-TRIMETHOPRIM 800-160 MG PO TABS: 1.0000 | ORAL_TABLET | ORAL | 0 refills | Status: DC

## 2022-02-20 NOTE — Progress Notes (Signed)
Placed order for Rad Eval for pheresis cath removal per MD order.

## 2022-02-23 ENCOUNTER — Ambulatory Visit: Payer: PRIVATE HEALTH INSURANCE | Admitting: Family Medicine

## 2022-02-23 ENCOUNTER — Encounter: Payer: Self-pay | Admitting: Family Medicine

## 2022-02-23 ENCOUNTER — Ambulatory Visit (INDEPENDENT_AMBULATORY_CARE_PROVIDER_SITE_OTHER): Payer: PRIVATE HEALTH INSURANCE | Admitting: Family Medicine

## 2022-02-23 VITALS — BP 110/60 | HR 89 | Ht 70.0 in | Wt 193.5 lb

## 2022-02-23 DIAGNOSIS — E119 Type 2 diabetes mellitus without complications: Secondary | ICD-10-CM

## 2022-02-23 DIAGNOSIS — L700 Acne vulgaris: Secondary | ICD-10-CM

## 2022-02-23 MED ORDER — BENZOYL PEROXIDE 5 % EX GEL: Freq: Two times a day (BID) | CUTANEOUS | 0 refills | Status: DC

## 2022-02-23 MED ORDER — GLIPIZIDE ER 5 MG PO TB24: 5.0000 mg | ORAL_TABLET | Freq: Every day | ORAL | 0 refills | Status: DC

## 2022-02-23 NOTE — Assessment & Plan Note (Signed)
Last A1c was 5.8 on 11/27/2021.  Patient has since had hospitalizations and been placed on prednisone and is currently on a very long taper.  After hospitalization, was put on glipizide 5 mg daily as well as insulin 3 units daily to help control his sugars. - Insulin 3 units daily, discontinue this once down to 10 mg of prednisone - Continue glipizide afterwards, if having hypoglycemic events will need to discontinue (consider discontinuing after decreasing to 5 mg of prednisone) - Continue to monitor for hypoglycemic episodes - We will recheck A1c 2 to 3 months after steroids and glipizide/insulin have been discontinued

## 2022-02-23 NOTE — Progress Notes (Signed)
    SUBJECTIVE:   CHIEF COMPLAINT / HPI:   Medication management - Needs glipizide refill - Has only had 1 hypoglycemic episode  - CBGs in the morning range from 70s-100s - On insulin 3 Units daily  Bumps on chest - Never had acne on his chest - Somewhat causes itching and irritation - Using cream with cortisone, which is not helping  PERTINENT  PMH / PSH: Reviewed   OBJECTIVE:   BP 110/60   Pulse 89   Ht '5\' 10"'$  (1.778 m)   Wt 193 lb 8 oz (87.8 kg)   SpO2 98%   BMI 27.76 kg/m   General: NAD, well-appearing, well-nourished Respiratory: No respiratory distress, breathing comfortably, able to speak in full sentences Skin: warm and dry, acne present on chest and back.  PICC in place on right chest Psych: Appropriate affect and mood  ASSESSMENT/PLAN:   Type 2 diabetes mellitus without complications (HCC) Last A1c was 5.8 on 11/27/2021.  Patient has since had hospitalizations and been placed on prednisone and is currently on a very long taper.  After hospitalization, was put on glipizide 5 mg daily as well as insulin 3 units daily to help control his sugars. - Insulin 3 units daily, discontinue this once down to 10 mg of prednisone - Continue glipizide afterwards, if having hypoglycemic events will need to discontinue (consider discontinuing after decreasing to 5 mg of prednisone) - Continue to monitor for hypoglycemic episodes - We will recheck A1c 2 to 3 months after steroids and glipizide/insulin have been discontinued   Acne vulgaris Physical exam consistent with acne vulgaris.  Possibly related to current prednisone use given its extended course.  We will trial topical medications at this time. - Recommend benzyl peroxide and OTC retinol - May need to consider BenzaClin if not improving  Rise Patience, [place]

## 2022-02-23 NOTE — Patient Instructions (Addendum)
For your sugars - stop taking the insulin once on '10mg'$  of Prednisone - Continue the Glipizide unless you are having low sugar episodes with symptoms. If that happens several days in a row, call our office and stop the medication - I don't want to check your A1c again until 2 months after being off prednisone and the medications.   For the skin: - you can use topical retinol and benzoyl peroxide - If it gets worse let me know and we can try a prescription.   Spot on head - Either a cyst or lipoma - Since you're on the steroids, would not do anything unless it looked infected or was becoming very large very quickly

## 2022-02-24 ENCOUNTER — Other Ambulatory Visit: Payer: Self-pay | Admitting: *Deleted

## 2022-02-24 DIAGNOSIS — M3119 Other thrombotic microangiopathy: Secondary | ICD-10-CM

## 2022-02-24 NOTE — Progress Notes (Signed)
Scheduled IR removal of pheresis cath on 02/26/22 at 0800 at [facility]. Patient agrees to appointment. No prep since it is under local.

## 2022-02-25 ENCOUNTER — Other Ambulatory Visit: Payer: Self-pay | Admitting: Internal Medicine

## 2022-02-26 ENCOUNTER — Other Ambulatory Visit: Payer: Self-pay | Admitting: Oncology

## 2022-02-26 ENCOUNTER — Inpatient Hospital Stay: Payer: Medicaid Other

## 2022-02-26 ENCOUNTER — Ambulatory Visit (HOSPITAL_COMMUNITY)
Admission: RE | Admit: 2022-02-26 | Discharge: 2022-02-26 | Disposition: A | Discharge status: Home / Self Care | Payer: Commercial Managed Care - HMO | Source: Ambulatory Visit | Attending: Oncology | Admitting: Oncology

## 2022-02-26 DIAGNOSIS — Z452 Encounter for adjustment and management of vascular access device: Secondary | ICD-10-CM | POA: Insufficient documentation

## 2022-02-26 DIAGNOSIS — M3119 Other thrombotic microangiopathy: Secondary | ICD-10-CM | POA: Insufficient documentation

## 2022-02-26 HISTORY — PX: IR REMOVAL TUN CV CATH W/O FL: IMG2289

## 2022-02-26 MED ORDER — LIDOCAINE HCL 1 % IJ SOLN
INTRAMUSCULAR | Status: AC
  Administered 2022-02-26: 10 mL via INTRAVENOUS
  Filled 2022-02-26: qty 20

## 2022-02-26 MED ORDER — LIDOCAINE-EPINEPHRINE 1 %-1:100000 IJ SOLN
INTRAMUSCULAR | Status: AC
  Filled 2022-02-26: qty 1

## 2022-02-26 NOTE — Procedures (Signed)
Successful removal of right IJ tunneled HD catheter.   After obtaining consent and performing a time-out, the right upper chest was prepped and draped in the normal sterile fashion. The heparin was removed from both ports. 1% lidocaine was used for local anesthesia. Using gentle blunt dissection and moderate manual traction the cuff of the catheter was exposed and the catheter was removed in its entirety. Pressure was held until hemostasis was obtained. A sterile dressing was applied. The patient tolerated the procedure well with no immediate complications.    Soyla Dryer, [place] [phone] 02/26/2022, 10:11 AM

## 2022-03-01 ENCOUNTER — Other Ambulatory Visit: Payer: Self-pay | Admitting: Nurse Practitioner

## 2022-03-01 DIAGNOSIS — M3119 Other thrombotic microangiopathy: Secondary | ICD-10-CM

## 2022-03-05 ENCOUNTER — Inpatient Hospital Stay: Payer: Medicaid Other | Attending: Oncology

## 2022-03-05 DIAGNOSIS — E119 Type 2 diabetes mellitus without complications: Secondary | ICD-10-CM | POA: Insufficient documentation

## 2022-03-05 DIAGNOSIS — R21 Rash and other nonspecific skin eruption: Secondary | ICD-10-CM | POA: Insufficient documentation

## 2022-03-05 DIAGNOSIS — M3119 Other thrombotic microangiopathy: Secondary | ICD-10-CM | POA: Insufficient documentation

## 2022-03-05 DIAGNOSIS — I10 Essential (primary) hypertension: Secondary | ICD-10-CM | POA: Insufficient documentation

## 2022-03-05 LAB — CMP (CANCER CENTER ONLY)
ALT: 23 U/L (ref 0–44)
AST: 12 U/L — ABNORMAL LOW (ref 15–41)
Albumin: 4.5 g/dL (ref 3.5–5.0)
Alkaline Phosphatase: 44 U/L (ref 38–126)
Anion gap: 11 (ref 5–15)
BUN: 21 mg/dL — ABNORMAL HIGH (ref 6–20)
CO2: 26 mmol/L (ref 22–32)
Calcium: 10.3 mg/dL (ref 8.9–10.3)
Chloride: 105 mmol/L (ref 98–111)
Creatinine: 0.98 mg/dL (ref 0.61–1.24)
GFR, Estimated: >60 mL/min (ref >60)
Glucose, Bld: 161 mg/dL — ABNORMAL HIGH (ref 70–99)
Potassium: 3.4 mmol/L — ABNORMAL LOW (ref 3.5–5.1)
Sodium: 142 mmol/L (ref 135–145)
Total Bilirubin: 0.5 mg/dL (ref 0.3–1.2)
Total Protein: 7.1 g/dL (ref 6.5–8.1)

## 2022-03-05 LAB — CBC WITH DIFFERENTIAL (CANCER CENTER ONLY)
Abs Immature Granulocytes: 0.03 10*3/uL (ref 0.00–0.07)
Basophils Absolute: 0 10*3/uL (ref 0.0–0.1)
Basophils Relative: 0 %
Eosinophils Absolute: 0.1 10*3/uL (ref 0.0–0.5)
Eosinophils Relative: 1 %
HCT: 43.7 % (ref 39.0–52.0)
Hemoglobin: 14.5 g/dL (ref 13.0–17.0)
Immature Granulocytes: 0 %
Lymphocytes Relative: 45 %
Lymphs Abs: 5.3 10*3/uL — ABNORMAL HIGH (ref 0.7–4.0)
MCH: 28.4 pg (ref 26.0–34.0)
MCHC: 33.2 g/dL (ref 30.0–36.0)
MCV: 85.5 fL (ref 80.0–100.0)
Monocytes Absolute: 0.8 10*3/uL (ref 0.1–1.0)
Monocytes Relative: 6 %
Neutro Abs: 5.6 10*3/uL (ref 1.7–7.7)
Neutrophils Relative %: 48 %
Platelet Count: 230 10*3/uL (ref 150–400)
RBC: 5.11 MIL/uL (ref 4.22–5.81)
RDW: 14 % (ref 11.5–15.5)
WBC Count: 11.9 10*3/uL — ABNORMAL HIGH (ref 4.0–10.5)
nRBC: 0 % (ref 0.0–0.2)

## 2022-03-05 LAB — LACTATE DEHYDROGENASE: LDH: 172 U/L (ref 98–192)

## 2022-03-08 LAB — ADAMTS13 ACTIVITY REFLEX

## 2022-03-08 LAB — ADAMTS13 ACTIVITY: Adamts 13 Activity: 51 % — ABNORMAL LOW (ref >66.8)

## 2022-03-09 ENCOUNTER — Encounter: Payer: Self-pay | Admitting: Oncology

## 2022-03-09 ENCOUNTER — Telehealth: Payer: Self-pay

## 2022-03-09 NOTE — Telephone Encounter (Signed)
-----   Message from Owens Shark, NP sent at 03/09/2022  8:03 AM EST ----- Please instruct him to taper prednisone to 10 mg daily

## 2022-03-09 NOTE — Telephone Encounter (Signed)
Patient gave verbal understanding and had no further questions or concerns  

## 2022-03-09 NOTE — Progress Notes (Signed)
Received fax from planned [place] requesting office notes from October 2023 to present.  Records faxed as requested to [phone].

## 2022-03-21 ENCOUNTER — Other Ambulatory Visit: Payer: Self-pay | Admitting: Family Medicine

## 2022-03-26 ENCOUNTER — Other Ambulatory Visit: Payer: Self-pay | Admitting: *Deleted

## 2022-03-26 ENCOUNTER — Inpatient Hospital Stay: Payer: Medicaid Other

## 2022-03-26 ENCOUNTER — Inpatient Hospital Stay (HOSPITAL_BASED_OUTPATIENT_CLINIC_OR_DEPARTMENT_OTHER): Payer: Medicaid Other | Admitting: Oncology

## 2022-03-26 VITALS — BP 134/70 | HR 84 | Temp 98.1°F | Resp 18 | Ht 70.0 in | Wt 195.0 lb

## 2022-03-26 DIAGNOSIS — M3119 Other thrombotic microangiopathy: Secondary | ICD-10-CM

## 2022-03-26 LAB — CBC WITH DIFFERENTIAL (CANCER CENTER ONLY)
Abs Immature Granulocytes: 0.03 10*3/uL (ref 0.00–0.07)
Basophils Absolute: 0 10*3/uL (ref 0.0–0.1)
Basophils Relative: 0 %
Eosinophils Absolute: 0.2 10*3/uL (ref 0.0–0.5)
Eosinophils Relative: 1 %
HCT: 45.3 % (ref 39.0–52.0)
Hemoglobin: 14.9 g/dL (ref 13.0–17.0)
Immature Granulocytes: 0 %
Lymphocytes Relative: 50 %
Lymphs Abs: 5.6 10*3/uL — ABNORMAL HIGH (ref 0.7–4.0)
MCH: 27.6 pg (ref 26.0–34.0)
MCHC: 32.9 g/dL (ref 30.0–36.0)
MCV: 83.9 fL (ref 80.0–100.0)
Monocytes Absolute: 1 10*3/uL (ref 0.1–1.0)
Monocytes Relative: 8 %
Neutro Abs: 4.8 10*3/uL (ref 1.7–7.7)
Neutrophils Relative %: 41 %
Platelet Count: 219 10*3/uL (ref 150–400)
RBC: 5.4 MIL/uL (ref 4.22–5.81)
RDW: 14.9 % (ref 11.5–15.5)
WBC Count: 11.6 10*3/uL — ABNORMAL HIGH (ref 4.0–10.5)
nRBC: 0 % (ref 0.0–0.2)

## 2022-03-26 LAB — CMP (CANCER CENTER ONLY)
ALT: 22 U/L (ref 0–44)
AST: 13 U/L — ABNORMAL LOW (ref 15–41)
Albumin: 4.5 g/dL (ref 3.5–5.0)
Alkaline Phosphatase: 59 U/L (ref 38–126)
Anion gap: 10 (ref 5–15)
BUN: 13 mg/dL (ref 6–20)
CO2: 26 mmol/L (ref 22–32)
Calcium: 9.5 mg/dL (ref 8.9–10.3)
Chloride: 105 mmol/L (ref 98–111)
Creatinine: 0.77 mg/dL (ref 0.61–1.24)
GFR, Estimated: >60 mL/min (ref >60)
Glucose, Bld: 197 mg/dL — ABNORMAL HIGH (ref 70–99)
Potassium: 3 mmol/L — ABNORMAL LOW (ref 3.5–5.1)
Sodium: 141 mmol/L (ref 135–145)
Total Bilirubin: 0.5 mg/dL (ref 0.3–1.2)
Total Protein: 6.8 g/dL (ref 6.5–8.1)

## 2022-03-26 LAB — MAGNESIUM: Magnesium: 1.4 mg/dL — ABNORMAL LOW (ref 1.7–2.4)

## 2022-03-26 MED ORDER — PREDNISONE 5 MG PO TABS: 5.0000 mg | ORAL_TABLET | Freq: Every day | ORAL | 0 refills | Status: DC

## 2022-03-26 NOTE — Progress Notes (Signed)
[place] [facility] OFFICE PROGRESS NOTE   Diagnosis: TTP  INTERVAL HISTORY:   Eric Hooper returns as scheduled.  No bleeding.  Good appetite.  He reports his blood sugar is lower with the prednisone taper.  He has been maintained on prednisone at a dose of 10 mg daily since 03/09/2022.  He has a persistent acne type rash over the trunk.  This did not improve with a cream prescribed by his primary provider.  He reports a daily headache in the right temporoparietal region.  Mild associated nausea.  Objective:  Vital signs in last 24 hours:  Blood pressure 134/70, pulse 84, temperature 98.1 F (36.7 C), temperature source Oral, resp. rate 18, height '5\' 10"'$  (1.778 m), weight 195 lb (88.5 kg), SpO2 99 %.    HEENT: No thrush Resp: Lungs clear bilaterally Cardio: Regular rate and rhythm GI: No hepatosplenomegaly Vascular: No leg edema  Skin: Acne type rash over the chest and back   Lab Results:  Lab Results  Component Value Date   WBC 11.6 (H) 03/26/2022   HGB 14.9 03/26/2022   HCT 45.3 03/26/2022   MCV 83.9 03/26/2022   PLT 219 03/26/2022   NEUTROABS 4.8 03/26/2022    CMP  Lab Results  Component Value Date   NA 141 03/26/2022   K 3.0 (L) 03/26/2022   CL 105 03/26/2022   CO2 26 03/26/2022   GLUCOSE 197 (H) 03/26/2022   BUN 13 03/26/2022   CREATININE 0.77 03/26/2022   CALCIUM 9.5 03/26/2022   PROT 6.8 03/26/2022   ALBUMIN 4.5 03/26/2022   AST 13 (L) 03/26/2022   ALT 22 03/26/2022   ALKPHOS 59 03/26/2022   BILITOT 0.5 03/26/2022   GFRNONAA >60 03/26/2022   GFRAA 125 06/14/2020     Medications: I have reviewed the patient's current medications.   Assessment/Plan: TTP initially diagnosed in October 2018 with recurrence in April 2020 and July 2023. Initiation of daily plasma exchange and Solu-Medrol 02/02/2017 [name] Antibody- 72, activity less than 2% on initial presentation; [name] from 08/15/2018 <2.0. Last plasma exchange 02/06/2017 Prednisone  60 mg daily beginning 02/08/2017 Prednisone 40 mg daily beginning 02/17/2017 Prednisone 30 mg daily beginning 03/09/2017 Prednisone 20 mg daily beginning 03/23/2017 Prednisone 10 mg daily beginning 04/01/2017 Prednisone 5 mg daily for 5 days then 5 mg every other day for 5 doses then stop beginning 05/11/2017 Recurrent TTP 08/14/2018 Plasma exchange/steroids started on 08/15/2018.  Plasma exchange discontinued after treatment on 08/19/2019, discharged on prednisone '60mg'$  daily Prednisone taper to 40 mg daily 08/22/2018 Weekly rituximab for 4 doses beginning 08/25/2018 Plasma exchange resumed 08/26/2018, 08/27/2018, 08/28/2018, 08/29/2018, 08/31/2018, 09/02/2018 Prednisone taper to 30 mg daily 09/01/2018 Cycle 3 weekly Rituxan 09/08/2018 Prednisone taper to 20 mg daily 09/08/2018 Cycle 4 weekly Rituxan 09/15/2018 Prednisone taper to 10 mg daily 09/20/2018 Prednisone taper to 5 mg daily 09/27/2018 Prednisone discontinued 10/07/2018 Admission with relapse of TTP 10/23/2021 Daily Plasma exchange initiated 11/23/2021 prednisone 8/1 Prednisone tapered to 40 mg daily beginning 12/04/2021 Weekly Rituxan for 4 doses beginning 12/04/2021 Discontinue plasmapheresis after appointment 12/05/2021 Admitted to [facility] 12/11/2021-plasmapheresis reinitiated, Cablivi started 12/12/2021, prednisone dose currently 80 mg daily Every other day plasma exchange beginning 12/28/2021-completed 01/09/2022 Second weekly Rituxan 12/31/2021 Prednisone taper to 60 mg daily beginning 12/31/2021 Third weekly Rituxan 01/06/2022 Prednisone taper to 40 mg daily beginning 01/07/2022 Fourth weekly rituximab 01/13/2022 Prednisone taper to 30 mg daily 01/13/2022 Prednisone taper to 20 mg daily 01/20/2022 Prednisone taper to 15 mg daily 02/04/2022 Per patient report 02/19/2022  he has continued prednisone 30 mg daily and did not make the above adjustments, he will decrease prednisone to 20 mg daily today Prednisone taper to 10 mg daily 03/09/2022 Prednisone taper to 5  mg daily 03/26/2022 2. Xiphoid pain-etiology unclear 3. Hypertension 4. Diabetes 5. Alcohol/Tobacco use 6. History of renal insufficiency 7. Right IJ dialysis catheter placed 02/02/2017, removed New right IJ dialysis catheter 08/15/2018 Catheter removed 09/12/2018 New right IJ dialysis catheter placed 11/23/2021 Temporary hemodialysis catheter converted to tunneled hemodialysis catheter 11/28/2021     Disposition: Eric Hooper.  Stable.  The platelet count remains normal.  The Adamts activity was improved on 03/05/2022 (51%).  The plan is to continue a slow prednisone taper.  He will decrease the prednisone to 5 mg daily.  Hopefully the skin rash will improve with the prednisone taper.  I recommended he follow-up with his primary provider to evaluate the headaches.  He will return for an office and lab visit in 3 weeks.  [name], MD  03/26/2022  10:22 AM

## 2022-03-26 NOTE — Addendum Note (Signed)
Addended by: Tania Ade on: 03/26/2022 11:37 AM   Modules accepted: Orders

## 2022-03-27 ENCOUNTER — Telehealth: Payer: Self-pay

## 2022-03-27 ENCOUNTER — Other Ambulatory Visit: Payer: Self-pay

## 2022-03-27 DIAGNOSIS — M3119 Other thrombotic microangiopathy: Secondary | ICD-10-CM

## 2022-03-27 NOTE — Telephone Encounter (Signed)
-----   Message from Ladell Pier, MD sent at 03/26/2022  4:50 PM EST ----- Please call patient, magnesium level is low, start magnesium oxide 400 mg twice daily, continue potassium, return for a BMP and then 1 week, we will prescribe IV magnesium if it cannot be corrected with an oral supplement

## 2022-03-27 NOTE — Telephone Encounter (Signed)
Patient gave verbal understanding and had no further questions or concerns. He is schedule for a lab 04/02/22 (BMP).

## 2022-03-30 ENCOUNTER — Other Ambulatory Visit: Payer: Self-pay

## 2022-03-30 DIAGNOSIS — E1165 Type 2 diabetes mellitus with hyperglycemia: Secondary | ICD-10-CM

## 2022-03-31 ENCOUNTER — Encounter: Payer: Self-pay | Admitting: *Deleted

## 2022-03-31 MED ORDER — METFORMIN HCL 1000 MG PO TABS: 1000.0000 mg | ORAL_TABLET | Freq: Two times a day (BID) | ORAL | 1 refills | Status: DC

## 2022-04-02 ENCOUNTER — Inpatient Hospital Stay: Payer: PRIVATE HEALTH INSURANCE | Attending: Oncology

## 2022-04-02 ENCOUNTER — Other Ambulatory Visit: Payer: Self-pay

## 2022-04-02 DIAGNOSIS — M3119 Other thrombotic microangiopathy: Secondary | ICD-10-CM | POA: Diagnosis present

## 2022-04-02 DIAGNOSIS — I10 Essential (primary) hypertension: Secondary | ICD-10-CM | POA: Diagnosis not present

## 2022-04-02 DIAGNOSIS — E1159 Type 2 diabetes mellitus with other circulatory complications: Secondary | ICD-10-CM | POA: Insufficient documentation

## 2022-04-02 DIAGNOSIS — Z7952 Long term (current) use of systemic steroids: Secondary | ICD-10-CM | POA: Diagnosis not present

## 2022-04-02 LAB — BASIC METABOLIC PANEL - CANCER CENTER ONLY
Anion gap: 12 (ref 5–15)
BUN: 11 mg/dL (ref 6–20)
CO2: 23 mmol/L (ref 22–32)
Calcium: 9.3 mg/dL (ref 8.9–10.3)
Chloride: 105 mmol/L (ref 98–111)
Creatinine: 0.81 mg/dL (ref 0.61–1.24)
GFR, Estimated: >60 mL/min (ref >60)
Glucose, Bld: 240 mg/dL — ABNORMAL HIGH (ref 70–99)
Potassium: 3.3 mmol/L — ABNORMAL LOW (ref 3.5–5.1)
Sodium: 140 mmol/L (ref 135–145)

## 2022-04-02 LAB — MAGNESIUM: Magnesium: 1.8 mg/dL (ref 1.7–2.4)

## 2022-04-03 ENCOUNTER — Encounter: Payer: Self-pay | Admitting: Family Medicine

## 2022-04-03 ENCOUNTER — Telehealth: Payer: Self-pay | Admitting: *Deleted

## 2022-04-03 ENCOUNTER — Ambulatory Visit (HOSPITAL_COMMUNITY): Payer: Commercial Managed Care - HMO | Attending: Family Medicine

## 2022-04-03 ENCOUNTER — Ambulatory Visit (INDEPENDENT_AMBULATORY_CARE_PROVIDER_SITE_OTHER): Payer: PRIVATE HEALTH INSURANCE | Admitting: Family Medicine

## 2022-04-03 ENCOUNTER — Telehealth: Payer: Self-pay

## 2022-04-03 VITALS — BP 144/80 | HR 86 | Wt 193.0 lb

## 2022-04-03 DIAGNOSIS — F17200 Nicotine dependence, unspecified, uncomplicated: Secondary | ICD-10-CM

## 2022-04-03 DIAGNOSIS — R079 Chest pain, unspecified: Secondary | ICD-10-CM | POA: Insufficient documentation

## 2022-04-03 DIAGNOSIS — R0789 Other chest pain: Secondary | ICD-10-CM | POA: Diagnosis not present

## 2022-04-03 DIAGNOSIS — M3119 Other thrombotic microangiopathy: Secondary | ICD-10-CM

## 2022-04-03 NOTE — Telephone Encounter (Signed)
-----   Message from Ladell Pier, MD sent at 04/02/2022  8:32 PM EST ----- Please call patient, the potassium level is better, continue potassium, follow-up as scheduled, check BMP and magnesium next visit

## 2022-04-03 NOTE — Assessment & Plan Note (Signed)
Currently smoking 3 cigarettes per day and wishes to quit - Patient going to start with nicotine patches - Counseled on other alternatives if he desires

## 2022-04-03 NOTE — Progress Notes (Signed)
    SUBJECTIVE:   CHIEF COMPLAINT / HPI:   MSK Pains - Present for the last 2 months but noticed it more in the last 2 weeks - Thought that it was heartburn but nexium wasn't helpin - Chest pains that happen on both sides  - Doesn't get worse with exertion - Dull aching pain, hurts more if he pushes on it - Doesn't have any associated diaphoresis, shortness of breath - Feels like it has been worse since starting magnesium supplement (stopping that today)  Tobacco use - smoking 3 cigarettes per day - wants to do the patches  Alcohol use - Stopped drinking when his platelets dropped   PERTINENT  PMH / PSH: Reviewed  OBJECTIVE:   BP (!) 144/80   Pulse 86   Wt 193 lb (87.5 kg)   SpO2 96%   BMI 27.69 kg/m   Gen: well-appearing, NAD CV: RRR, no m/r/g appreciated, no peripheral edema Pulm: CTAB, no wheezes/crackles GI: soft, non-tender, non-distended MSK: spine non-TTP. Chest wall with worsened pressure sensation upon palpation of bilateral anterior regions. Chest wall pain improves with slouching forward and worsens with extension of upper back. No obvious deformity noted  EKG was NSR with rate of 60  ASSESSMENT/PLAN:   Tobacco use disorder Currently smoking 3 cigarettes per day and wishes to quit - Patient going to start with nicotine patches - Counseled on other alternatives if he desires  Chest wall pain Chest pain atypical and more consistent with an MSK pain. He is higher risk with chest pain and has been evaluated previously by cardiology. EKG in the office was reassuring and exam consistent with MSK etiology.  - Conservative management discussed with patient - Recommend smoking cessation - Educated on concerning cardiac symptoms - Discussed strict return and ER precautions   HTN BP elevated at 144/80 in the office. No changes in medications at this time. Patient asymptomatic.   Rise Patience, [place]

## 2022-04-03 NOTE — Patient Instructions (Addendum)
It was so great seeing you today! Today we discussed the following:  Chest pains - I feel this is more muscular in nature, your EKG was good and your heart sounded fine and the history of symptoms doesn't seem to be related to your heart - If you feel that there is worsening when you are walking or exercising and it takes your breath away then please let me know immediately - I want to have you try some stretches and exercises first - I also recommend trying IcyHot, and lidocaine patches (4% that you can get over the counter) to see if that helps with the discomfort  Smoking cessation - You should be able to take the nicotine replacement patches without issue, which you can get over the counter - We do also have other medications we can use/try and if you become interested in those just let me know.    Please make sure to bring any medications you take to your appointments. If you have any questions or concerns please call the office at [phone].

## 2022-04-03 NOTE — Telephone Encounter (Signed)
Patient calls nurse line requesting to schedule appointment for chest pain and bilateral lower leg pain. He reports that this has been going on for the last two weeks. He informed Dr. Carrie Mew about this and they recommended that if pain persisted, he should schedule appointment with PCP. Scheduled patient for this afternoon with Dr. Oleh Genin.   ED precautions discussed.   Talbot Grumbling, RN

## 2022-04-03 NOTE — Telephone Encounter (Signed)
Called Mr. Cuevas with the improved K+ results and to continue to take KCL 20 meq daily. Per Dr. Benay Spice, since Mg+ is normal, OK to stop taking this. Will recheck at next visit.

## 2022-04-03 NOTE — Assessment & Plan Note (Signed)
Chest pain atypical and more consistent with an MSK pain. He is higher risk with chest pain and has been evaluated previously by cardiology. EKG in the office was reassuring and exam consistent with MSK etiology.  - Conservative management discussed with patient - Recommend smoking cessation - Educated on concerning cardiac symptoms - Discussed strict return and ER precautions

## 2022-04-14 ENCOUNTER — Other Ambulatory Visit: Payer: Self-pay | Admitting: *Deleted

## 2022-04-14 ENCOUNTER — Other Ambulatory Visit: Payer: Self-pay | Admitting: Family Medicine

## 2022-04-14 ENCOUNTER — Telehealth: Payer: Self-pay | Admitting: *Deleted

## 2022-04-14 ENCOUNTER — Encounter: Payer: Self-pay | Admitting: *Deleted

## 2022-04-14 ENCOUNTER — Encounter: Payer: Self-pay | Admitting: Oncology

## 2022-04-14 DIAGNOSIS — E119 Type 2 diabetes mellitus without complications: Secondary | ICD-10-CM

## 2022-04-14 DIAGNOSIS — M3119 Other thrombotic microangiopathy: Secondary | ICD-10-CM

## 2022-04-14 NOTE — Telephone Encounter (Signed)
Patient is needing a new referral for his eye exam.  He sees [facility] at [place].  Will forward to MD to place the referral.  Thanks [facility]

## 2022-04-14 NOTE — Progress Notes (Signed)
Faxed 03/26/22 office note to [employer], [employer] ([place]) [phone].

## 2022-04-14 NOTE — Progress Notes (Signed)
Notified by Sunni in lab that [name] reported that blood sample for AdamT13 was "lost". Provider notified. Will re-draw on 12/21.

## 2022-04-16 ENCOUNTER — Encounter: Payer: Self-pay | Admitting: Nurse Practitioner

## 2022-04-16 ENCOUNTER — Telehealth: Payer: Self-pay

## 2022-04-16 ENCOUNTER — Inpatient Hospital Stay: Payer: PRIVATE HEALTH INSURANCE

## 2022-04-16 ENCOUNTER — Inpatient Hospital Stay (HOSPITAL_BASED_OUTPATIENT_CLINIC_OR_DEPARTMENT_OTHER): Payer: PRIVATE HEALTH INSURANCE | Admitting: Nurse Practitioner

## 2022-04-16 VITALS — BP 142/82 | HR 79 | Temp 98.1°F | Resp 18 | Ht 70.0 in | Wt 196.6 lb

## 2022-04-16 DIAGNOSIS — M3119 Other thrombotic microangiopathy: Secondary | ICD-10-CM | POA: Diagnosis not present

## 2022-04-16 LAB — CMP (CANCER CENTER ONLY)
ALT: 20 U/L (ref 0–44)
AST: 11 U/L — ABNORMAL LOW (ref 15–41)
Albumin: 4.4 g/dL (ref 3.5–5.0)
Alkaline Phosphatase: 68 U/L (ref 38–126)
Anion gap: 11 (ref 5–15)
BUN: 13 mg/dL (ref 6–20)
CO2: 25 mmol/L (ref 22–32)
Calcium: 10 mg/dL (ref 8.9–10.3)
Chloride: 106 mmol/L (ref 98–111)
Creatinine: 0.81 mg/dL (ref 0.61–1.24)
GFR, Estimated: >60 mL/min (ref >60)
Glucose, Bld: 197 mg/dL — ABNORMAL HIGH (ref 70–99)
Potassium: 3.3 mmol/L — ABNORMAL LOW (ref 3.5–5.1)
Sodium: 142 mmol/L (ref 135–145)
Total Bilirubin: 0.8 mg/dL (ref 0.3–1.2)
Total Protein: 6.5 g/dL (ref 6.5–8.1)

## 2022-04-16 LAB — CBC WITH DIFFERENTIAL (CANCER CENTER ONLY)
Abs Immature Granulocytes: 0.03 10*3/uL (ref 0.00–0.07)
Basophils Absolute: 0 10*3/uL (ref 0.0–0.1)
Basophils Relative: 0 %
Eosinophils Absolute: 0.2 10*3/uL (ref 0.0–0.5)
Eosinophils Relative: 2 %
HCT: 46.1 % (ref 39.0–52.0)
Hemoglobin: 15.3 g/dL (ref 13.0–17.0)
Immature Granulocytes: 0 %
Lymphocytes Relative: 47 %
Lymphs Abs: 5 10*3/uL — ABNORMAL HIGH (ref 0.7–4.0)
MCH: 27.4 pg (ref 26.0–34.0)
MCHC: 33.2 g/dL (ref 30.0–36.0)
MCV: 82.6 fL (ref 80.0–100.0)
Monocytes Absolute: 0.9 10*3/uL (ref 0.1–1.0)
Monocytes Relative: 9 %
Neutro Abs: 4.5 10*3/uL (ref 1.7–7.7)
Neutrophils Relative %: 42 %
Platelet Count: 221 10*3/uL (ref 150–400)
RBC: 5.58 MIL/uL (ref 4.22–5.81)
RDW: 16 % — ABNORMAL HIGH (ref 11.5–15.5)
WBC Count: 10.6 10*3/uL — ABNORMAL HIGH (ref 4.0–10.5)
nRBC: 0 % (ref 0.0–0.2)

## 2022-04-16 LAB — LACTATE DEHYDROGENASE: LDH: 152 U/L (ref 98–192)

## 2022-04-16 LAB — MAGNESIUM: Magnesium: 1.7 mg/dL (ref 1.7–2.4)

## 2022-04-16 NOTE — Telephone Encounter (Signed)
Patient notified of test results. Verbalizes understanding and agrees to the plan. Will call for further concerns. 

## 2022-04-16 NOTE — Telephone Encounter (Signed)
-----   Message from Owens Shark, NP sent at 04/16/2022  3:27 PM EST ----- Please let him know potassium remains mildly low.  He should increase Kdur to 20 meq twice a day for 3 days then resume 20 meq daily.

## 2022-04-16 NOTE — Progress Notes (Signed)
[facility] OFFICE PROGRESS NOTE   Diagnosis: TTP  INTERVAL HISTORY:   Eric Hooper returns as scheduled.  He denies bleeding.  Blood sugars typically range from 110-120.  A high reading would be 180.  Headaches are less frequent.  He continues to note a rash over his chest.  Objective:  Vital signs in last 24 hours:  Blood pressure (!) 142/82, pulse 79, temperature 98.1 F (36.7 C), temperature source Oral, resp. rate 18, height '5\' 10"'$  (1.778 m), weight 196 lb 9.6 oz (89.2 kg), SpO2 99 %.    HEENT: No thrush. Resp: Lungs clear bilaterally. Cardio: Regular rate and rhythm. GI: Abdomen soft and nontender.  No hepatosplenomegaly. Vascular: No leg edema. Neuro: Alert and oriented. Skin: Acne type rash over anterior chest.   Lab Results:  Lab Results  Component Value Date   WBC 10.6 (H) 04/16/2022   HGB 15.3 04/16/2022   HCT 46.1 04/16/2022   MCV 82.6 04/16/2022   PLT 221 04/16/2022   NEUTROABS 4.5 04/16/2022    Imaging:  No results found.  Medications: I have reviewed the patient's current medications.  Assessment/Plan: TTP initially diagnosed in October 2018 with recurrence in April 2020 and July 2023. Initiation of daily plasma exchange and Solu-Medrol 02/02/2017 [name] Antibody- 72, activity less than 2% on initial presentation; [name] from 08/15/2018 <2.0. Last plasma exchange 02/06/2017 Prednisone 60 mg daily beginning 02/08/2017 Prednisone 40 mg daily beginning 02/17/2017 Prednisone 30 mg daily beginning 03/09/2017 Prednisone 20 mg daily beginning 03/23/2017 Prednisone 10 mg daily beginning 04/01/2017 Prednisone 5 mg daily for 5 days then 5 mg every other day for 5 doses then stop beginning 05/11/2017 Recurrent TTP 08/14/2018 Plasma exchange/steroids started on 08/15/2018.  Plasma exchange discontinued after treatment on 08/19/2019, discharged on prednisone '60mg'$  daily Prednisone taper to 40 mg daily 08/22/2018 Weekly rituximab for 4 doses  beginning 08/25/2018 Plasma exchange resumed 08/26/2018, 08/27/2018, 08/28/2018, 08/29/2018, 08/31/2018, 09/02/2018 Prednisone taper to 30 mg daily 09/01/2018 Cycle 3 weekly Rituxan 09/08/2018 Prednisone taper to 20 mg daily 09/08/2018 Cycle 4 weekly Rituxan 09/15/2018 Prednisone taper to 10 mg daily 09/20/2018 Prednisone taper to 5 mg daily 09/27/2018 Prednisone discontinued 10/07/2018 Admission with relapse of TTP 10/23/2021 Daily Plasma exchange initiated 11/23/2021 prednisone 8/1 Prednisone tapered to 40 mg daily beginning 12/04/2021 Weekly Rituxan for 4 doses beginning 12/04/2021 Discontinue plasmapheresis after appointment 12/05/2021 Admitted to [facility] 12/11/2021-plasmapheresis reinitiated, Cablivi started 12/12/2021, prednisone dose currently 80 mg daily Every other day plasma exchange beginning 12/28/2021-completed 01/09/2022 Second weekly Rituxan 12/31/2021 Prednisone taper to 60 mg daily beginning 12/31/2021 Third weekly Rituxan 01/06/2022 Prednisone taper to 40 mg daily beginning 01/07/2022 Fourth weekly rituximab 01/13/2022 Prednisone taper to 30 mg daily 01/13/2022 Prednisone taper to 20 mg daily 01/20/2022 Prednisone taper to 15 mg daily 02/04/2022 Per patient report 02/19/2022 he has continued prednisone 30 mg daily and did not make the above adjustments, he will decrease prednisone to 20 mg daily today Prednisone taper to 10 mg daily 03/09/2022 Prednisone taper to 5 mg daily 03/26/2022 Prednisone taper to 5 mg every other day for 5 doses beginning 04/16/2022 2. Xiphoid pain-etiology unclear 3. Hypertension 4. Diabetes 5. Alcohol/Tobacco use 6. History of renal insufficiency 7. Right IJ dialysis catheter placed 02/02/2017, removed New right IJ dialysis catheter 08/15/2018 Catheter removed 09/12/2018 New right IJ dialysis catheter placed 11/23/2021 Temporary hemodialysis catheter converted to tunneled hemodialysis catheter 11/28/2021    Disposition: Eric Hooper appears stable.  The platelet count remained  stable in normal range.  He will decrease  prednisone from 5 mg daily to 5 mg every other day for 5 doses and then stop.  We will follow-up on the AdamTS activity level from today.  We discussed the likelihood the chest rash is related to steroids and will hopefully improve once he is off of steroids.  He will return for lab and follow-up in 3 weeks.    Ned Card ANP/GNP-BC   04/16/2022  9:06 AM

## 2022-04-18 LAB — ADAMTS13 ACTIVITY REFLEX

## 2022-04-18 LAB — ADAMTS13 ACTIVITY: Adamts 13 Activity: 86.5 % (ref >66.8)

## 2022-04-19 ENCOUNTER — Other Ambulatory Visit: Payer: Self-pay | Admitting: Oncology

## 2022-04-21 ENCOUNTER — Telehealth: Payer: Self-pay | Admitting: *Deleted

## 2022-04-21 NOTE — Telephone Encounter (Signed)
Received refill request for prednisone 5 mg from patient. According to office note he was to take it qod for 5 doses. He reports he has still been taking it daily. Took dose yesterday. Informed him to hold prednisone today and script will be sent for 5 doses with specific dates to take it and then stop: 12/27, 12/29, 12/31, 1/02, then 1/04. He agrees.

## 2022-04-24 ENCOUNTER — Encounter: Payer: Self-pay | Admitting: Oncology

## 2022-05-12 ENCOUNTER — Inpatient Hospital Stay: Payer: Medicaid Other

## 2022-05-12 ENCOUNTER — Inpatient Hospital Stay: Payer: Medicaid Other | Admitting: Oncology

## 2022-05-21 ENCOUNTER — Other Ambulatory Visit: Payer: Self-pay | Admitting: Family Medicine

## 2022-05-21 DIAGNOSIS — J101 Influenza due to other identified influenza virus with other respiratory manifestations: Secondary | ICD-10-CM

## 2022-05-26 ENCOUNTER — Inpatient Hospital Stay (HOSPITAL_BASED_OUTPATIENT_CLINIC_OR_DEPARTMENT_OTHER): Payer: Medicaid Other | Admitting: Oncology

## 2022-05-26 ENCOUNTER — Inpatient Hospital Stay: Payer: Medicaid Other | Attending: Oncology

## 2022-05-26 VITALS — BP 150/82 | HR 83 | Temp 98.1°F | Resp 18 | Ht 70.0 in | Wt 194.2 lb

## 2022-05-26 DIAGNOSIS — M3119 Other thrombotic microangiopathy: Secondary | ICD-10-CM | POA: Insufficient documentation

## 2022-05-26 DIAGNOSIS — I10 Essential (primary) hypertension: Secondary | ICD-10-CM | POA: Diagnosis not present

## 2022-05-26 DIAGNOSIS — E1159 Type 2 diabetes mellitus with other circulatory complications: Secondary | ICD-10-CM | POA: Insufficient documentation

## 2022-05-26 LAB — CBC WITH DIFFERENTIAL (CANCER CENTER ONLY)
Abs Immature Granulocytes: 0.02 10*3/uL (ref 0.00–0.07)
Basophils Absolute: 0 10*3/uL (ref 0.0–0.1)
Basophils Relative: 0 %
Eosinophils Absolute: 0.2 10*3/uL (ref 0.0–0.5)
Eosinophils Relative: 2 %
HCT: 43.7 % (ref 39.0–52.0)
Hemoglobin: 14.7 g/dL (ref 13.0–17.0)
Immature Granulocytes: 0 %
Lymphocytes Relative: 48 %
Lymphs Abs: 4.6 10*3/uL — ABNORMAL HIGH (ref 0.7–4.0)
MCH: 27.8 pg (ref 26.0–34.0)
MCHC: 33.6 g/dL (ref 30.0–36.0)
MCV: 82.6 fL (ref 80.0–100.0)
Monocytes Absolute: 0.9 10*3/uL (ref 0.1–1.0)
Monocytes Relative: 9 %
Neutro Abs: 4 10*3/uL (ref 1.7–7.7)
Neutrophils Relative %: 41 %
Platelet Count: 218 10*3/uL (ref 150–400)
RBC: 5.29 MIL/uL (ref 4.22–5.81)
RDW: 19.6 % — ABNORMAL HIGH (ref 11.5–15.5)
WBC Count: 9.8 10*3/uL (ref 4.0–10.5)
nRBC: 0 % (ref 0.0–0.2)

## 2022-05-26 LAB — CMP (CANCER CENTER ONLY)
ALT: 23 U/L (ref 0–44)
AST: 13 U/L — ABNORMAL LOW (ref 15–41)
Albumin: 4.6 g/dL (ref 3.5–5.0)
Alkaline Phosphatase: 74 U/L (ref 38–126)
Anion gap: 9 (ref 5–15)
BUN: 13 mg/dL (ref 6–20)
CO2: 24 mmol/L (ref 22–32)
Calcium: 9.7 mg/dL (ref 8.9–10.3)
Chloride: 107 mmol/L (ref 98–111)
Creatinine: 0.88 mg/dL (ref 0.61–1.24)
GFR, Estimated: >60 mL/min (ref >60)
Glucose, Bld: 99 mg/dL (ref 70–99)
Potassium: 3.6 mmol/L (ref 3.5–5.1)
Sodium: 140 mmol/L (ref 135–145)
Total Bilirubin: 0.7 mg/dL (ref 0.3–1.2)
Total Protein: 6.9 g/dL (ref 6.5–8.1)

## 2022-05-26 NOTE — Progress Notes (Signed)
[facility] OFFICE PROGRESS NOTE   Diagnosis: TTP  INTERVAL HISTORY:   [name] returns as scheduled.  He feels well.  No fever.  No bleeding.  He continues to have a rash at the upper anterior chest and back.  He had "influenza "in early January.  Objective:  Vital signs in last 24 hours:  Blood pressure (!) 150/82, pulse 83, temperature 98.1 F (36.7 C), temperature source Oral, resp. rate 18, height '5\' 10"'$  (1.778 m), weight 194 lb 3.2 oz (88.1 kg), SpO2 100 %.    HEENT: No thrush Resp: Lungs clear bilaterally Cardio: Regular rate and rhythm GI: No hepatosplenomegaly Vascular: No leg edema  Skin: No ecchymoses or petechiae   Lab Results:  Lab Results  Component Value Date   WBC 9.8 05/26/2022   HGB 14.7 05/26/2022   HCT 43.7 05/26/2022   MCV 82.6 05/26/2022   PLT 218 05/26/2022   NEUTROABS 4.0 05/26/2022    CMP  Lab Results  Component Value Date   NA 142 04/16/2022   K 3.3 (L) 04/16/2022   CL 106 04/16/2022   CO2 25 04/16/2022   GLUCOSE 197 (H) 04/16/2022   BUN 13 04/16/2022   CREATININE 0.81 04/16/2022   CALCIUM 10.0 04/16/2022   PROT 6.5 04/16/2022   ALBUMIN 4.4 04/16/2022   AST 11 (L) 04/16/2022   ALT 20 04/16/2022   ALKPHOS 68 04/16/2022   BILITOT 0.8 04/16/2022   GFRNONAA >60 04/16/2022   GFRAA 125 06/14/2020     Medications: I have reviewed the patient's current medications.   Assessment/Plan: TTP initially diagnosed in October 2018 with recurrence in April 2020 and July 2023. Initiation of daily plasma exchange and Solu-Medrol 02/02/2017 [name] Antibody- 72, activity less than 2% on initial presentation; [name] from 08/15/2018 <2.0. Last plasma exchange 02/06/2017 Prednisone 60 mg daily beginning 02/08/2017 Prednisone 40 mg daily beginning 02/17/2017 Prednisone 30 mg daily beginning 03/09/2017 Prednisone 20 mg daily beginning 03/23/2017 Prednisone 10 mg daily beginning 04/01/2017 Prednisone 5 mg daily for 5 days  then 5 mg every other day for 5 doses then stop beginning 05/11/2017 Recurrent TTP 08/14/2018 Plasma exchange/steroids started on 08/15/2018.  Plasma exchange discontinued after treatment on 08/19/2019, discharged on prednisone '60mg'$  daily Prednisone taper to 40 mg daily 08/22/2018 Weekly rituximab for 4 doses beginning 08/25/2018 Plasma exchange resumed 08/26/2018, 08/27/2018, 08/28/2018, 08/29/2018, 08/31/2018, 09/02/2018 Prednisone taper to 30 mg daily 09/01/2018 Cycle 3 weekly Rituxan 09/08/2018 Prednisone taper to 20 mg daily 09/08/2018 Cycle 4 weekly Rituxan 09/15/2018 Prednisone taper to 10 mg daily 09/20/2018 Prednisone taper to 5 mg daily 09/27/2018 Prednisone discontinued 10/07/2018 Admission with relapse of TTP 10/23/2021 Daily Plasma exchange initiated 11/23/2021 prednisone 8/1 Prednisone tapered to 40 mg daily beginning 12/04/2021 Weekly Rituxan for 4 doses beginning 12/04/2021 Discontinue plasmapheresis after appointment 12/05/2021 Admitted to [facility] 12/11/2021-plasmapheresis reinitiated, Cablivi started 12/12/2021, prednisone dose currently 80 mg daily Every other day plasma exchange beginning 12/28/2021-completed 01/09/2022 Second weekly Rituxan 12/31/2021 Prednisone taper to 60 mg daily beginning 12/31/2021 Third weekly Rituxan 01/06/2022 Prednisone taper to 40 mg daily beginning 01/07/2022 Fourth weekly rituximab 01/13/2022 Prednisone taper to 30 mg daily 01/13/2022 Prednisone taper to 20 mg daily 01/20/2022 Prednisone taper to 15 mg daily 02/04/2022 Per patient report 02/19/2022 he has continued prednisone 30 mg daily and did not make the above adjustments, he will decrease prednisone to 20 mg daily today Prednisone taper to 10 mg daily 03/09/2022 Prednisone taper to 5 mg daily 03/26/2022 Prednisone taper to 5 mg every other  day for 5 doses beginning 04/16/2022 2. Xiphoid pain-etiology unclear 3. Hypertension 4. Diabetes 5. Alcohol/Tobacco use 6. History of renal insufficiency 7. Right IJ dialysis  catheter placed 02/02/2017, removed New right IJ dialysis catheter 08/15/2018 Catheter removed 09/12/2018 New right IJ dialysis catheter placed 11/23/2021 Temporary hemodialysis catheter converted to tunneled hemodialysis catheter 11/28/2021      Disposition: [name] appears stable.  He is in clinical remission from TTP.  He has been maintained off of prednisone for the past 3-4 weeks.  He will call for bleeding or bruising.  He will return for an office and lab visit in 1 month.  We will follow-up on the chemistry panel and LDH from today.  We will check the [name] when he returns next month.  [name], MD  05/26/2022  8:12 AM

## 2022-05-29 ENCOUNTER — Encounter: Payer: Self-pay | Admitting: Family Medicine

## 2022-05-29 ENCOUNTER — Ambulatory Visit (INDEPENDENT_AMBULATORY_CARE_PROVIDER_SITE_OTHER): Payer: Medicaid Other | Admitting: Family Medicine

## 2022-05-29 VITALS — BP 156/106 | HR 82 | Temp 98.2°F | Resp 18 | Ht 70.0 in | Wt 193.0 lb

## 2022-05-29 DIAGNOSIS — R7303 Prediabetes: Secondary | ICD-10-CM

## 2022-05-29 DIAGNOSIS — E119 Type 2 diabetes mellitus without complications: Secondary | ICD-10-CM | POA: Diagnosis not present

## 2022-05-29 DIAGNOSIS — E782 Mixed hyperlipidemia: Secondary | ICD-10-CM

## 2022-05-29 DIAGNOSIS — I10 Essential (primary) hypertension: Secondary | ICD-10-CM

## 2022-05-29 MED ORDER — LOSARTAN POTASSIUM-HCTZ 50-12.5 MG PO TABS: 1.0000 | ORAL_TABLET | Freq: Every day | ORAL | 1 refills | Status: DC

## 2022-05-29 NOTE — Progress Notes (Unsigned)
SUBJECTIVE:   CHIEF COMPLAINT / HPI:   Diabetes and HTN follow up [name] is a 47 yo man who presents with his fiance for follow up of his blood pressure and diabetes.   T2DM regimen: metformin 1,000 mg BID HTN regimen: amlodipine 5 mg, losartan 50 mg  He reports a desire to be off of metformin for diabetes. He has increased loose stools because of this. He has also heard of numerous side effects of metformin form friends and family, including blindness. He adamantly wants to be off this medication.   He also took his antihypertensives late this morning, right before walking into the office. He believes this is the reason his blood pressure is elevated today. Denies headache, vision changes, and focal neurological deficit.   Lab Results  Component Value Date   HGBA1C 6.9 (H) 05/29/2022   HGBA1C 5.8 (H) 11/27/2021   HGBA1C 7.6 (H) 06/18/2021   Lab Results  Component Value Date   MICROALBUR 150 11/24/2019   LDLCALC 39 05/29/2022   CREATININE 0.88 05/26/2022   Lab Results  Component Value Date   CHOL 94 (L) 05/29/2022   HDL 42 05/29/2022   LDLCALC 39 05/29/2022   TRIG 53 05/29/2022   CHOLHDL 2.2 05/29/2022   BP Readings from Last 3 Encounters:  05/29/22 (!) 156/106  05/26/22 (!) 150/82  04/16/22 (!) 142/82    PERTINENT  PMH / PSH:  Patient Active Problem List   Diagnosis Date Noted   Cervical disc disorder with radiculopathy of cervical region 06/20/2020   Seasonal allergic rhinitis 03/05/2020   Chronic left shoulder pain 11/27/2019   Insomnia 06/09/2019   PICC (peripherally inserted central catheter) in place 09/05/2018   Mixed hyperlipidemia 02/28/2018   Type 2 diabetes mellitus without complications (Eric Hooper) 51/05/5850   Tobacco use disorder 04/13/2017   GERD (gastroesophageal reflux disease)    Hypertension    TTP (thrombotic thrombocytopenic purpura) (Eric Hooper) 02/06/2017   Thrombocytopenia (Eric Hooper) 02/01/2017   Diverticulosis 02/01/2017    OBJECTIVE:   BP  (!) 156/106 (BP Location: Left Arm, Patient Position: Sitting, Cuff Size: Normal)   Pulse 82   Temp 98.2 F (36.8 C)   Resp 18   Ht '5\' 10"'$  (1.778 m)   Wt 193 lb (87.5 kg)   SpO2 98%   BMI 27.69 kg/m    PHQ-9:     05/29/2022   10:53 AM 02/23/2022   10:00 AM 05/16/2021   10:44 AM  Depression screen PHQ 2/9  Decreased Interest 0 0 0  Down, Depressed, Hopeless 0 0 0  PHQ - 2 Score 0 0 0  Altered sleeping '3 3 3  '$ Tired, decreased energy 0 3 0  Change in appetite 0 2 0  Feeling bad or failure about yourself  0 0 0  Trouble concentrating 0 1 0  Moving slowly or fidgety/restless 0 2 0  Suicidal thoughts 0 0 0  PHQ-9 Score '3 11 3  '$ Difficult doing work/chores Extremely dIfficult Somewhat difficult Extremely dIfficult    Physical Exam General: Awake, alert, oriented Cardiovascular: Regular rate and rhythm, S1 and S2 present, no murmurs auscultated Respiratory: Lung fields clear to auscultation bilaterally Extremities: No bilateral lower extremity edema, palpable pedal and pretibial pulses bilaterally Neuro: Cranial nerves II through X grossly intact, able to move all extremities spontaneously   ASSESSMENT/PLAN:   Type 2 diabetes mellitus without complications (HCC) Will obtain A1c with venipuncture labs and call to discuss. Could consider switching metformin to injectable such as Ozempic  or an SGLT-2 for renal protection. Also collecting urine microalbumin.   Hypertension Elevated. No red flags. Appears persistently elevated across multiple prior visits. Continue amlodipine and losartan, add HCTZ.  - DC losartan mono pill - START losartan-HCTZ combo - return in one week for BP follow up and BMP     Eric Essex, MD Eric Hooper

## 2022-05-29 NOTE — Patient Instructions (Signed)
It was wonderful to see you today. Thank you for allowing me to be a part of your care. Below is a short summary of what we discussed at your visit today:  Blood pressure STOP the losartan.  START the combo pill with losartan and HCTZ.  Come back in one week to recheck BP.   Diabetes Today we got blood and urine samples. If the results are normal, I will send you a letter or MyChart message. If the results are abnormal, I will give you a call.     Please bring all of your medications to every appointment!  If you have any questions or concerns, please do not hesitate to contact us via phone or MyChart message.   Ezequiel Essex, MD

## 2022-05-30 ENCOUNTER — Other Ambulatory Visit: Payer: Self-pay | Admitting: Family Medicine

## 2022-05-30 DIAGNOSIS — I10 Essential (primary) hypertension: Secondary | ICD-10-CM

## 2022-05-31 ENCOUNTER — Encounter: Payer: Self-pay | Admitting: Family Medicine

## 2022-05-31 NOTE — Assessment & Plan Note (Signed)
Elevated. No red flags. Appears persistently elevated across multiple prior visits. Continue amlodipine and losartan, add HCTZ.  - DC losartan mono pill - START losartan-HCTZ combo - return in one week for BP follow up and BMP

## 2022-05-31 NOTE — Assessment & Plan Note (Signed)
Will obtain A1c with venipuncture labs and call to discuss. Could consider switching metformin to injectable such as Ozempic or an SGLT-2 for renal protection. Also collecting urine microalbumin.

## 2022-06-01 ENCOUNTER — Telehealth: Payer: Self-pay

## 2022-06-01 NOTE — Telephone Encounter (Signed)
Received phone call from Drucilla Chalet regarding patient. She has questions regarding BP medications.  Should patient continue taking amlodipine? Advised that per note, he should continue amlodipine and add combination pill, losartan-HCTZ.  She is also asking if he should continue potassium supplementation. Will forward to provider for further clarification.  Talbot Grumbling, RN

## 2022-06-01 NOTE — Telephone Encounter (Signed)
Returned call to Verdis Frederickson and informed of message from Dr. Jeani Hawking.   No further questions at this time.   Talbot Grumbling, RN

## 2022-06-02 LAB — HEMOGLOBIN A1C
Est. average glucose Bld gHb Est-mCnc: 151 mg/dL
Hgb A1c MFr Bld: 6.9 % — ABNORMAL HIGH (ref 4.8–5.6)

## 2022-06-02 LAB — MICROALBUMIN / CREATININE URINE RATIO
Creatinine, Urine: 190.5 mg/dL
Microalb/Creat Ratio: 13 mg/g creat (ref 0–29)
Microalbumin, Urine: 25.5 ug/mL

## 2022-06-02 LAB — LIPID PANEL
Chol/HDL Ratio: 2.2 ratio (ref 0.0–5.0)
Cholesterol, Total: 94 mg/dL — ABNORMAL LOW (ref 100–199)
HDL: 42 mg/dL (ref >39)
LDL Chol Calc (NIH): 39 mg/dL (ref 0–99)
Triglycerides: 53 mg/dL (ref 0–149)
VLDL Cholesterol Cal: 13 mg/dL (ref 5–40)

## 2022-06-03 ENCOUNTER — Telehealth: Payer: Self-pay | Admitting: Family Medicine

## 2022-06-03 NOTE — Telephone Encounter (Signed)
Called patient to discuss diabetes and lab results.  No answer, however voicemail possible identified patient by name.  Left HIPAA compliant voicemail.  A1c worsened from last, but still at goal for T2DM.  Urine microalbumin normal.  Lipid panel with LDL at goal. Can't calculate ASCVD risk given low total cholesterol.   Other diabetes related medications: Atorvastatin 40 mg, losartan 50 mg  Patient previously discussed wanting to get off metformin.  Given his labs, he is a good candidate for other agents.  If he would like to continue with a pill, could consider Jardiance or other SGLT2 inhibitors as these have some data for solo use.  Could also consider once weekly injectable with a GLP-1.  Asked patient to either call back or discuss further at his appointment with [place] on 2/9 at 11:10 AM.   Ezequiel Essex, MD

## 2022-06-05 ENCOUNTER — Other Ambulatory Visit: Payer: Self-pay | Admitting: Student

## 2022-06-05 ENCOUNTER — Ambulatory Visit: Payer: Medicaid Other | Admitting: Student

## 2022-06-05 ENCOUNTER — Encounter: Payer: Self-pay | Admitting: Student

## 2022-06-05 ENCOUNTER — Ambulatory Visit: Payer: Self-pay | Admitting: Family Medicine

## 2022-06-05 ENCOUNTER — Encounter: Payer: Self-pay | Admitting: Oncology

## 2022-06-05 VITALS — BP 135/82 | HR 90 | Resp 16 | Ht 68.0 in | Wt 192.0 lb

## 2022-06-05 DIAGNOSIS — E119 Type 2 diabetes mellitus without complications: Secondary | ICD-10-CM

## 2022-06-05 MED ORDER — DAPAGLIFLOZIN PROPANEDIOL 5 MG PO TABS: 5.0000 mg | ORAL_TABLET | Freq: Every day | ORAL | 3 refills | Status: DC

## 2022-06-05 NOTE — Assessment & Plan Note (Addendum)
A1c a week ago was 6.9.  Reports glucose was usually well-controlled until recent treatment for TTP with high-dose steroid.  He had good tolerance to metformin but was concerned about blindness with interest to switch to an oral antidiabetic medication.  Discussed options of GLP-1 vs SGLT2.  After discussion patient decided he will prefer taking a daily pill over weekly injections. -Discontinued his metformin -Rx Farxiga 5 mg daily -Discussed side effects of medication putting UTI and fungi infection, patient verbalized understanding and agreeable. -Follow-up in 3 months for diabetes follow-up.

## 2022-06-05 NOTE — Patient Instructions (Signed)
It was wonderful to see you today. Thank you for allowing me to be a part of your care. Below is a short summary of what we discussed at your visit today:  I have discontinued your metformin as requested.  I have started you on Farxiga which you will take 5 mg daily.  Make sure you take this in the morning.  Follow up in 3 months or earlier as needed   Please bring all of your medications to every appointment!  If you have any questions or concerns, please do not hesitate to contact us via phone or MyChart message.   Alen Bleacher, MD [facility]

## 2022-06-05 NOTE — Progress Notes (Signed)
Pt presents for diabetes f/u  -pt states that he does not want to take Metformin any longer due to what he has read about ppl losing limbs and kidney failure from taking medication

## 2022-06-05 NOTE — Progress Notes (Signed)
    SUBJECTIVE:   CHIEF COMPLAINT / HPI:   Patient is a 47 year old male history of diabetes presenting today to discuss to discuss switching medications.  He is currently on metformin but expressed concerns of blindness on the medication and would like to switch to a different medications. Recently saw his telemetry just during the conversation was he was concerned that his metformin could be causing vision changes and also found on the Internet that metformin could cause blindness.  Last A1c a week ago was 6.9.  Note patient reports that he is A1c was well-controlled until he was admitted in the hospital for TTP and started on high dose steroid treatment which he believes affected his A1c and blood sugars.  PERTINENT  PMH / PSH: Reviewed  OBJECTIVE:   BP 135/82 (BP Location: Right Arm, Patient Position: Sitting, Cuff Size: Large)   Pulse 90   Resp 16   Ht '5\' 8"'$  (1.727 m)   Wt 192 lb (87.1 kg)   SpO2 97%   BMI 29.19 kg/m    Physical Exam General: Alert, well appearing, NAD Cardiovascular: RRR, No Murmurs, Normal S2/S2 Respiratory: CTAB, No wheezing or Rales Abdomen: No distension or tenderness   ASSESSMENT/PLAN:   Diabetes mellitus (HCC) A1c a week ago was 6.9.  Reports glucose was usually well-controlled until recent treatment for TTP with high-dose steroid.  He had good tolerance to metformin but was concerned about blindness with interest to switch to an oral antidiabetic medication.  Discussed options of GLP-1 vs SGLT2.  After discussion patient decided he will prefer taking a daily pill over weekly injections. -Discontinued his metformin -Rx Farxiga 5 mg daily -Discussed side effects of medication putting UTI and fungi infection, patient verbalized understanding and agreeable. -Follow-up in 3 months for diabetes follow-up.     Alen Bleacher, MD [place]

## 2022-06-06 ENCOUNTER — Other Ambulatory Visit: Payer: Self-pay | Admitting: Student

## 2022-06-06 ENCOUNTER — Encounter: Payer: Self-pay | Admitting: Student

## 2022-06-06 DIAGNOSIS — E119 Type 2 diabetes mellitus without complications: Secondary | ICD-10-CM

## 2022-06-06 MED ORDER — EMPAGLIFLOZIN 10 MG PO TABS: 10.0000 mg | ORAL_TABLET | Freq: Every day | ORAL | 3 refills | Status: DC

## 2022-06-06 NOTE — Progress Notes (Signed)
Switched from [place] to Eric Hooper. Insurance requesting prior [place] for Farxiga.

## 2022-06-08 ENCOUNTER — Other Ambulatory Visit: Payer: Self-pay | Admitting: Family Medicine

## 2022-06-08 DIAGNOSIS — E119 Type 2 diabetes mellitus without complications: Secondary | ICD-10-CM

## 2022-06-09 ENCOUNTER — Telehealth: Payer: Self-pay | Admitting: *Deleted

## 2022-06-09 MED ORDER — EMPAGLIFLOZIN 10 MG PO TABS: 10.0000 mg | ORAL_TABLET | Freq: Every day | ORAL | 3 refills | Status: DC

## 2022-06-09 NOTE — Telephone Encounter (Signed)
Transmission to pharmacy failed.  Resent again. Christen Bame, CMA

## 2022-06-09 NOTE — Addendum Note (Signed)
Addended by: Christen Bame D on: 06/09/2022 09:21 AM   Modules accepted: Orders

## 2022-06-09 NOTE — Telephone Encounter (Signed)
Discussed with patient, he states his wife is probably handling it but he would like to have a medication on board for his diabetes.  He would prefer to stay away from injectable medications and metformin was discontinued due to patient concern for vision loss.  We have tried initiating [place] and Vania Rea however concerned with insurance approval.  I discussed indications and side effects for GLP-1 injectables.  No family medical history of thyroid cancer per patient's knowledge. Will attempt to get insurance approval for SGLT2i's first via clinical pharmacy tech.  I have sent her a message.

## 2022-06-09 NOTE — Telephone Encounter (Signed)
Pts wife calls, per Pharmacy the Vania Rea is also not covered.  Currently pt is not taking any meds for DM.  She would like to know what they should do now.  Christen Bame, CMA

## 2022-06-10 ENCOUNTER — Other Ambulatory Visit (HOSPITAL_COMMUNITY): Payer: Self-pay

## 2022-06-10 ENCOUNTER — Telehealth: Payer: Self-pay | Admitting: Family Medicine

## 2022-06-10 ENCOUNTER — Encounter: Payer: Self-pay | Admitting: Oncology

## 2022-06-10 NOTE — Telephone Encounter (Signed)
Patient's wife called after hours emergency line calling about diabetes medicine- states it was supposed to be called in to the pharmacy.  "Starts with Dap" I am not sure which medication they are referring to. Looked at A1c and expressed that I am reassured that this is not an emergency given his A1c is 6.9 but that I will send to his PCP. Wife expressed understanding.

## 2022-06-11 ENCOUNTER — Other Ambulatory Visit (HOSPITAL_COMMUNITY): Payer: Self-pay

## 2022-06-11 NOTE — Telephone Encounter (Signed)
Left message with Verdis Frederickson (s/o) requesting call back regarding patients insurance.   Needing to attempt some test claims and prior auths however insurance verfiication is showing patient has a commercial coverage before his medicaid. Not sure if patient is aware. He may need to reach out to medicaid.

## 2022-06-11 NOTE — Telephone Encounter (Signed)
Patient returns call to nurse line.   He reports he does not have [place] insurance anymore. He reports he got a letter as of 04/27/2022 stating that insurance had been terminated. He reports he also spoke with [place] today and verified his non enrollment.   Will forward to [place].

## 2022-06-15 ENCOUNTER — Other Ambulatory Visit (HOSPITAL_COMMUNITY): Payer: Self-pay

## 2022-06-15 ENCOUNTER — Telehealth: Payer: Self-pay

## 2022-06-15 NOTE — Telephone Encounter (Signed)
Prior Auth for patients medication JARDIANCE approved by [employer] from 06/15/22 to 06/16/23.  CoverMyMeds Key: [id number]

## 2022-06-15 NOTE — Telephone Encounter (Signed)
A Prior Authorization was initiated for this patients JARDIANCE through CoverMyMeds.   Key: [id number]

## 2022-06-16 ENCOUNTER — Other Ambulatory Visit: Payer: Self-pay | Admitting: Oncology

## 2022-06-16 DIAGNOSIS — M3119 Other thrombotic microangiopathy: Secondary | ICD-10-CM

## 2022-06-20 ENCOUNTER — Other Ambulatory Visit: Payer: Self-pay | Admitting: Family Medicine

## 2022-06-20 DIAGNOSIS — I10 Essential (primary) hypertension: Secondary | ICD-10-CM

## 2022-06-25 ENCOUNTER — Encounter: Payer: Self-pay | Admitting: Nurse Practitioner

## 2022-06-25 ENCOUNTER — Inpatient Hospital Stay: Payer: Medicaid Other | Attending: Oncology

## 2022-06-25 ENCOUNTER — Inpatient Hospital Stay: Payer: Medicaid Other | Admitting: Nurse Practitioner

## 2022-06-25 VITALS — BP 132/80 | HR 68 | Temp 98.2°F | Resp 20 | Ht 68.0 in | Wt 197.6 lb

## 2022-06-25 DIAGNOSIS — M3119 Other thrombotic microangiopathy: Secondary | ICD-10-CM | POA: Diagnosis present

## 2022-06-25 LAB — CMP (CANCER CENTER ONLY)
ALT: 31 U/L (ref 0–44)
AST: 20 U/L (ref 15–41)
Albumin: 4.5 g/dL (ref 3.5–5.0)
Alkaline Phosphatase: 78 U/L (ref 38–126)
Anion gap: 5 (ref 5–15)
BUN: 15 mg/dL (ref 6–20)
CO2: 32 mmol/L (ref 22–32)
Calcium: 9.5 mg/dL (ref 8.9–10.3)
Chloride: 103 mmol/L (ref 98–111)
Creatinine: 0.9 mg/dL (ref 0.61–1.24)
GFR, Estimated: >60 mL/min (ref >60)
Glucose, Bld: 154 mg/dL — ABNORMAL HIGH (ref 70–99)
Potassium: 3.4 mmol/L — ABNORMAL LOW (ref 3.5–5.1)
Sodium: 140 mmol/L (ref 135–145)
Total Bilirubin: 0.7 mg/dL (ref 0.3–1.2)
Total Protein: 7.1 g/dL (ref 6.5–8.1)

## 2022-06-25 LAB — CBC WITH DIFFERENTIAL (CANCER CENTER ONLY)
Abs Immature Granulocytes: 0.02 10*3/uL (ref 0.00–0.07)
Basophils Absolute: 0 10*3/uL (ref 0.0–0.1)
Basophils Relative: 0 %
Eosinophils Absolute: 0.3 10*3/uL (ref 0.0–0.5)
Eosinophils Relative: 3 %
HCT: 45.6 % (ref 39.0–52.0)
Hemoglobin: 15.8 g/dL (ref 13.0–17.0)
Immature Granulocytes: 0 %
Lymphocytes Relative: 44 %
Lymphs Abs: 4.1 10*3/uL — ABNORMAL HIGH (ref 0.7–4.0)
MCH: 29.9 pg (ref 26.0–34.0)
MCHC: 34.6 g/dL (ref 30.0–36.0)
MCV: 86.2 fL (ref 80.0–100.0)
Monocytes Absolute: 0.7 10*3/uL (ref 0.1–1.0)
Monocytes Relative: 7 %
Neutro Abs: 4.1 10*3/uL (ref 1.7–7.7)
Neutrophils Relative %: 46 %
Platelet Count: 207 10*3/uL (ref 150–400)
RBC: 5.29 MIL/uL (ref 4.22–5.81)
RDW: 17.2 % — ABNORMAL HIGH (ref 11.5–15.5)
WBC Count: 9.2 10*3/uL (ref 4.0–10.5)
nRBC: 0 % (ref 0.0–0.2)

## 2022-06-25 LAB — LACTATE DEHYDROGENASE: LDH: 179 U/L (ref 98–192)

## 2022-06-25 NOTE — Progress Notes (Signed)
[name] OFFICE PROGRESS NOTE   Diagnosis: TTP  INTERVAL HISTORY:   Eric Hooper returns as scheduled.  He feels well overall.  Blurry vision has improved.  No unusual headaches.  No bleeding or bruising.  He has occasional leg pain.  Rash is better.  Objective:  Vital signs in last 24 hours:  Blood pressure 132/80, pulse 68, temperature 98.2 F (36.8 C), temperature source Oral, resp. rate 20, height '5\' 8"'$  (1.727 m), weight 197 lb 9.6 oz (89.6 kg), SpO2 100 %.    HEENT: No thrush or ulcers. Resp: Lungs clear bilaterally. Cardio: Regular rate and rhythm. GI: Abdomen soft and nontender.  No hepatosplenomegaly. Vascular: No leg edema.  Calves soft and nontender. Neuro: Alert and oriented. Skin: No ecchymoses or petechiae.   Lab Results:  Lab Results  Component Value Date   WBC 9.2 06/25/2022   HGB 15.8 06/25/2022   HCT 45.6 06/25/2022   MCV 86.2 06/25/2022   PLT 207 06/25/2022   NEUTROABS 4.1 06/25/2022    Imaging:  No results found.  Medications: I have reviewed the patient's current medications.  Assessment/Plan: TTP initially diagnosed in October 2018 with recurrence in April 2020 and July 2023. Initiation of daily plasma exchange and Solu-Medrol 02/02/2017 [name] Antibody- 72, activity less than 2% on initial presentation; [name] from 08/15/2018 <2.0. Last plasma exchange 02/06/2017 Prednisone 60 mg daily beginning 02/08/2017 Prednisone 40 mg daily beginning 02/17/2017 Prednisone 30 mg daily beginning 03/09/2017 Prednisone 20 mg daily beginning 03/23/2017 Prednisone 10 mg daily beginning 04/01/2017 Prednisone 5 mg daily for 5 days then 5 mg every other day for 5 doses then stop beginning 05/11/2017 Recurrent TTP 08/14/2018 Plasma exchange/steroids started on 08/15/2018.  Plasma exchange discontinued after treatment on 08/19/2019, discharged on prednisone '60mg'$  daily Prednisone taper to 40 mg daily 08/22/2018 Weekly rituximab for 4 doses  beginning 08/25/2018 Plasma exchange resumed 08/26/2018, 08/27/2018, 08/28/2018, 08/29/2018, 08/31/2018, 09/02/2018 Prednisone taper to 30 mg daily 09/01/2018 Cycle 3 weekly Rituxan 09/08/2018 Prednisone taper to 20 mg daily 09/08/2018 Cycle 4 weekly Rituxan 09/15/2018 Prednisone taper to 10 mg daily 09/20/2018 Prednisone taper to 5 mg daily 09/27/2018 Prednisone discontinued 10/07/2018 Admission with relapse of TTP 10/23/2021 Daily Plasma exchange initiated 11/23/2021 prednisone 8/1 Prednisone tapered to 40 mg daily beginning 12/04/2021 Weekly Rituxan for 4 doses beginning 12/04/2021 Discontinue plasmapheresis after appointment 12/05/2021 Admitted to [facility] 12/11/2021-plasmapheresis reinitiated, Cablivi started 12/12/2021, prednisone dose currently 80 mg daily Every other day plasma exchange beginning 12/28/2021-completed 01/09/2022 Second weekly Rituxan 12/31/2021 Prednisone taper to 60 mg daily beginning 12/31/2021 Third weekly Rituxan 01/06/2022 Prednisone taper to 40 mg daily beginning 01/07/2022 Fourth weekly rituximab 01/13/2022 Prednisone taper to 30 mg daily 01/13/2022 Prednisone taper to 20 mg daily 01/20/2022 Prednisone taper to 15 mg daily 02/04/2022 Per patient report 02/19/2022 he has continued prednisone 30 mg daily and did not make the above adjustments, he will decrease prednisone to 20 mg daily today Prednisone taper to 10 mg daily 03/09/2022 Prednisone taper to 5 mg daily 03/26/2022 Prednisone taper to 5 mg every other day for 5 doses beginning 04/16/2022 2. Xiphoid pain-etiology unclear 3. Hypertension 4. Diabetes 5. Alcohol/Tobacco use 6. History of renal insufficiency 7. Right IJ dialysis catheter placed 02/02/2017, removed New right IJ dialysis catheter 08/15/2018 Catheter removed 09/12/2018 New right IJ dialysis catheter placed 11/23/2021 Temporary hemodialysis catheter converted to tunneled hemodialysis catheter 11/28/2021        Disposition: Eric Hooper remains in clinical remission from TTP.   He has now been maintained  off of prednisone for approximately 2 months.  He understands to contact the office with bruising/bleeding.  We will follow-up on the [name] result from today.  He will return for lab and follow-up in 1 month.  We are available to see him sooner if needed.  Ned Card ANP/GNP-BC   06/25/2022  8:07 AM

## 2022-06-26 ENCOUNTER — Ambulatory Visit: Payer: Medicaid Other

## 2022-06-27 LAB — ADAMTS13 ACTIVITY: Adamts 13 Activity: 67.8 % (ref >66.8)

## 2022-06-27 LAB — ADAMTS13 ACTIVITY REFLEX

## 2022-06-29 ENCOUNTER — Telehealth: Payer: Self-pay | Admitting: *Deleted

## 2022-06-29 NOTE — Telephone Encounter (Signed)
Notified Mr. Saladino of normal AdamsT13 result.

## 2022-06-29 NOTE — Telephone Encounter (Signed)
-----   Message from Owens Shark, NP sent at 06/29/2022  2:57 PM EST ----- Please let him know [name] activity level is normal.

## 2022-07-14 ENCOUNTER — Encounter: Payer: Self-pay | Admitting: Oncology

## 2022-07-20 ENCOUNTER — Telehealth: Payer: Self-pay

## 2022-07-20 NOTE — Telephone Encounter (Signed)
Dental clearance form was fill out by Dr Benay Spice and faxed back to the dental office.

## 2022-07-23 ENCOUNTER — Ambulatory Visit: Payer: Medicaid Other | Admitting: Family Medicine

## 2022-07-23 VITALS — BP 141/80 | HR 89 | Ht 68.0 in | Wt 201.6 lb

## 2022-07-23 DIAGNOSIS — I10 Essential (primary) hypertension: Secondary | ICD-10-CM | POA: Diagnosis not present

## 2022-07-23 DIAGNOSIS — R519 Headache, unspecified: Secondary | ICD-10-CM | POA: Insufficient documentation

## 2022-07-23 MED ORDER — LOSARTAN POTASSIUM-HCTZ 100-12.5 MG PO TABS: 1.0000 | ORAL_TABLET | Freq: Every day | ORAL | 0 refills | Status: DC

## 2022-07-23 NOTE — Assessment & Plan Note (Addendum)
BP remains above goal. -Continue amlodipine 5mg  daily -Increase Losartan-HCTZ to 100-12.5mg  daily (from 50-12.5mg ) -Getting labs (including CMP) tomorrow with heme/onc -His potassium has been persistently low (was low even prior to initiation of HCTZ). Now on 20 mEq K daily-- he likely needs workup for hyperaldosteronism

## 2022-07-23 NOTE — Assessment & Plan Note (Signed)
Mild, daily headache. No red flags. Normal neuro exam. BP is mildly elevated which may be contributing. Also suspect tension-type headache from lack of sleep and/or component from allergic rhinitis.  -Adjust BP meds as below -Counseled on proper sleep hygiene; can retry Melatonin; Advised CBT-I apps. -Offered treatment for allergic rhinitis but patient declines- does not like nasal spray, did not find cetirizine helpful in the past

## 2022-07-23 NOTE — Progress Notes (Signed)
    SUBJECTIVE:   CHIEF COMPLAINT / HPI:   Headache -feels like it started after he started Jardiance and after HCTZ was added to his Losartan -noticed it about 1 month ago -frontal, sometimes behind his eyes -described as mild but occurs daily -doesn't bother him much but just wants to make sure nothing is wrong -hasn't taken any meds for it -no nausea/vomiting, no photophobia, no weakness, no speech changes, no vision changes, no hearing issues -some nasal congestion which is chronic. He did not find flonase or zyrtec helpful in the past -got new glasses 2 months ago -does not sleep much at all; 3-4 hours per night -stays hydrated, 6-7 bottles of water daily -quit drinking alcohol few months ago -continues to smoke cigarettes -does not check BP at home -family member had stroke yesterday which made him nervous about his blood pressure/headaches  PERTINENT  PMH / PSH: TTP, T2DM, HTN, tobacco use disorder  OBJECTIVE:   BP (!) 141/80   Pulse 89   Ht 5\' 8"  (1.727 m)   Wt 201 lb 9.6 oz (91.4 kg)   SpO2 100%   BMI 30.65 kg/m   Gen: NAD, pleasant, able to participate in exam HEENT: [place]/AT, PERRLA, EOMI, nares patent bilaterally, oropharynx unremarkable, no sinus tenderness Neck: supple, no cervical or supraclavicular lymphadenopathy CV: RRR, normal S1/S2 Resp: Normal effort, lungs CTAB GI: abdomen soft, non-tender, non-distended Extremities: no edema or cyanosis Skin: warm and dry, no rashes noted Neuro: alert, CN II-XII intact, normal finger-to-nose, normal gait, 5/5 strength in bilateral upper and lower extremities, sensation intact to light touch in all extremities Psych: Normal affect and mood   ASSESSMENT/PLAN:   Nonintractable headache Mild, daily headache. No red flags. Normal neuro exam. BP is mildly elevated which may be contributing. Also suspect tension-type headache from lack of sleep and/or component from allergic rhinitis.  -Adjust BP meds as  below -Counseled on proper sleep hygiene; can retry Melatonin; Advised CBT-I apps. -Offered treatment for allergic rhinitis but patient declines- does not like nasal spray, did not find cetirizine helpful in the past  Hypertension BP remains above goal. -Continue amlodipine 5mg  daily -Increase Losartan-HCTZ to 100-12.5mg  daily (from 50-12.5mg ) -Getting labs (including CMP) tomorrow with heme/onc -His potassium has been persistently low (was low even prior to initiation of HCTZ). Now on 20 mEq K daily-- he likely needs workup for hyperaldosteronism   Return in 2 weeks for BP follow up and further smoking cessation discussion  Alcus Dad, MD [place]

## 2022-07-23 NOTE — Patient Instructions (Addendum)
It was great to see you!  Your blood pressure is elevated today. We are going to increase the dose of the Losartan in your Losartan-HCTZ combo pill. I sent a new prescription to replace the old one.   I will follow-up the results of your labs that the heme/onc doctor is checking tomorrow and see what your potassium is doing. We may need to do some additional blood work to look for different causes of high blood pressure and low potassium.  Your headaches are likely related to a few things including your blood pressure but also your lack of sleep.   - Try the following to help you sleep better:  - avoid naps during the day  - no screens (TV, phone, tablet, computer) at least 1-2 hours before bedtime.  - have a quiet and dark sleeping environment.  - no large meals or drinks about 1 hour before bed.  - Avoid taking diuretics (hydrochlorothiazide, furosemide) in the evenings.  - Avoid caffeine after 3pm.  - Exercise or move your body regularly every day.  - You can also try melatonin 10 mg over the counter. Take this 1-2 hours before bed.  - If you are lying in bed for 30 mins, get out of bed and do something relaxing like reading (NO TV!) until you are tired - Try CBT-I (CBT for insomnia) apps which are free! - QUIT smoking! Nicotine can keep you awake.  Return in 2 weeks for blood pressure follow-up and to discuss quitting smoking  Take care, Dr Rock Nephew

## 2022-07-24 ENCOUNTER — Encounter: Payer: Self-pay | Admitting: Oncology

## 2022-07-24 ENCOUNTER — Inpatient Hospital Stay (HOSPITAL_BASED_OUTPATIENT_CLINIC_OR_DEPARTMENT_OTHER): Payer: PRIVATE HEALTH INSURANCE | Admitting: Oncology

## 2022-07-24 ENCOUNTER — Inpatient Hospital Stay: Payer: PRIVATE HEALTH INSURANCE | Attending: Oncology

## 2022-07-24 VITALS — BP 140/80 | HR 80 | Temp 98.1°F | Resp 18 | Ht 68.0 in | Wt 202.0 lb

## 2022-07-24 DIAGNOSIS — M3119 Other thrombotic microangiopathy: Secondary | ICD-10-CM | POA: Insufficient documentation

## 2022-07-24 LAB — CBC WITH DIFFERENTIAL (CANCER CENTER ONLY)
Abs Immature Granulocytes: 0.03 10*3/uL (ref 0.00–0.07)
Basophils Absolute: 0.1 10*3/uL (ref 0.0–0.1)
Basophils Relative: 1 %
Eosinophils Absolute: 0.5 10*3/uL (ref 0.0–0.5)
Eosinophils Relative: 4 %
HCT: 47.7 % (ref 39.0–52.0)
Hemoglobin: 16.2 g/dL (ref 13.0–17.0)
Immature Granulocytes: 0 %
Lymphocytes Relative: 44 %
Lymphs Abs: 4.9 10*3/uL — ABNORMAL HIGH (ref 0.7–4.0)
MCH: 29.8 pg (ref 26.0–34.0)
MCHC: 34 g/dL (ref 30.0–36.0)
MCV: 87.7 fL (ref 80.0–100.0)
Monocytes Absolute: 0.9 10*3/uL (ref 0.1–1.0)
Monocytes Relative: 8 %
Neutro Abs: 4.7 10*3/uL (ref 1.7–7.7)
Neutrophils Relative %: 43 %
Platelet Count: 207 10*3/uL (ref 150–400)
RBC: 5.44 MIL/uL (ref 4.22–5.81)
RDW: 14.3 % (ref 11.5–15.5)
WBC Count: 10.9 10*3/uL — ABNORMAL HIGH (ref 4.0–10.5)
nRBC: 0 % (ref 0.0–0.2)

## 2022-07-24 LAB — CMP (CANCER CENTER ONLY)
ALT: 33 U/L (ref 0–44)
AST: 21 U/L (ref 15–41)
Albumin: 4.3 g/dL (ref 3.5–5.0)
Alkaline Phosphatase: 92 U/L (ref 38–126)
Anion gap: 7 (ref 5–15)
BUN: 15 mg/dL (ref 6–20)
CO2: 28 mmol/L (ref 22–32)
Calcium: 9.5 mg/dL (ref 8.9–10.3)
Chloride: 106 mmol/L (ref 98–111)
Creatinine: 0.86 mg/dL (ref 0.61–1.24)
GFR, Estimated: >60 mL/min (ref >60)
Glucose, Bld: 151 mg/dL — ABNORMAL HIGH (ref 70–99)
Potassium: 3.7 mmol/L (ref 3.5–5.1)
Sodium: 141 mmol/L (ref 135–145)
Total Bilirubin: 0.9 mg/dL (ref 0.3–1.2)
Total Protein: 6.7 g/dL (ref 6.5–8.1)

## 2022-07-24 LAB — LACTATE DEHYDROGENASE: LDH: 158 U/L (ref 98–192)

## 2022-07-24 NOTE — Progress Notes (Signed)
[facility] OFFICE PROGRESS NOTE   Diagnosis: TTP  INTERVAL HISTORY:   Eric Hooper returns as scheduled.  No fever, night sweats, or bleeding.  He has persistent hyperpigmented skin lesions over the trunk.  Objective:  Vital signs in last 24 hours:  Blood pressure (!) 140/80, pulse 80, temperature 98.1 F (36.7 C), temperature source Oral, resp. rate 18, height 5\' 8"  (1.727 m), weight 202 lb (91.6 kg), SpO2 99 %.     Resp: Lungs clear bilaterally Cardio: Regular rate and rhythm GI: No hepatosplenomegaly Vascular: No leg edema  Skin: Brown uniform lesions, chiefly over the trunk   Lab Results:  Lab Results  Component Value Date   WBC 10.9 (H) 07/24/2022   HGB 16.2 07/24/2022   HCT 47.7 07/24/2022   MCV 87.7 07/24/2022   PLT 207 07/24/2022   NEUTROABS 4.7 07/24/2022    CMP  Lab Results  Component Value Date   NA 141 07/24/2022   K 3.7 07/24/2022   CL 106 07/24/2022   CO2 28 07/24/2022   GLUCOSE 151 (H) 07/24/2022   BUN 15 07/24/2022   CREATININE 0.86 07/24/2022   CALCIUM 9.5 07/24/2022   PROT 6.7 07/24/2022   ALBUMIN 4.3 07/24/2022   AST 21 07/24/2022   ALT 33 07/24/2022   ALKPHOS 92 07/24/2022   BILITOT 0.9 07/24/2022   GFRNONAA >60 07/24/2022   GFRAA 125 06/14/2020     Medications: I have reviewed the patient's current medications.   Assessment/Plan: TTP initially diagnosed in October 2018 with recurrence in April 2020 and July 2023. Initiation of daily plasma exchange and Solu-Medrol 02/02/2017 [name] Antibody- 72, activity less than 2% on initial presentation; [name] from 08/15/2018 <2.0. Last plasma exchange 02/06/2017 Prednisone 60 mg daily beginning 02/08/2017 Prednisone 40 mg daily beginning 02/17/2017 Prednisone 30 mg daily beginning 03/09/2017 Prednisone 20 mg daily beginning 03/23/2017 Prednisone 10 mg daily beginning 04/01/2017 Prednisone 5 mg daily for 5 days then 5 mg every other day for 5 doses then stop beginning  05/11/2017 Recurrent TTP 08/14/2018 Plasma exchange/steroids started on 08/15/2018.  Plasma exchange discontinued after treatment on 08/19/2019, discharged on prednisone 60mg  daily Prednisone taper to 40 mg daily 08/22/2018 Weekly rituximab for 4 doses beginning 08/25/2018 Plasma exchange resumed 08/26/2018, 08/27/2018, 08/28/2018, 08/29/2018, 08/31/2018, 09/02/2018 Prednisone taper to 30 mg daily 09/01/2018 Cycle 3 weekly Rituxan 09/08/2018 Prednisone taper to 20 mg daily 09/08/2018 Cycle 4 weekly Rituxan 09/15/2018 Prednisone taper to 10 mg daily 09/20/2018 Prednisone taper to 5 mg daily 09/27/2018 Prednisone discontinued 10/07/2018 Admission with relapse of TTP 10/23/2021 Daily Plasma exchange initiated 11/23/2021 prednisone 8/1 Prednisone tapered to 40 mg daily beginning 12/04/2021 Weekly Rituxan for 4 doses beginning 12/04/2021 Discontinue plasmapheresis after appointment 12/05/2021 Admitted to [facility] 12/11/2021-plasmapheresis reinitiated, Cablivi started 12/12/2021, prednisone dose currently 80 mg daily Every other day plasma exchange beginning 12/28/2021-completed 01/09/2022 Second weekly Rituxan 12/31/2021 Prednisone taper to 60 mg daily beginning 12/31/2021 Third weekly Rituxan 01/06/2022 Prednisone taper to 40 mg daily beginning 01/07/2022 Fourth weekly rituximab 01/13/2022 Prednisone taper to 30 mg daily 01/13/2022 Prednisone taper to 20 mg daily 01/20/2022 Prednisone taper to 15 mg daily 02/04/2022 Per patient report 02/19/2022 he has continued prednisone 30 mg daily and did not make the above adjustments, he will decrease prednisone to 20 mg daily today Prednisone taper to 10 mg daily 03/09/2022 Prednisone taper to 5 mg daily 03/26/2022 Prednisone taper to 5 mg every other day for 5 doses beginning 04/16/2022 2. Xiphoid pain-etiology unclear 3. Hypertension 4. Diabetes 5. Alcohol/Tobacco  use 6. History of renal insufficiency 7. Right IJ dialysis catheter placed 02/02/2017, removed New right IJ dialysis  catheter 08/15/2018 Catheter removed 09/12/2018 New right IJ dialysis catheter placed 11/23/2021 Temporary hemodialysis catheter converted to tunneled hemodialysis catheter 11/28/2021       Disposition: Eric Hooper is in clinical remission from TTP.  We will follow-up on the LDH and ADAMTS activity from today.  He will return for office and lab visit in 2 months.  We will refer him to dermatology to evaluate the hyperpigmented skin lesions.  [name], MD  07/24/2022  8:42 AM

## 2022-07-26 LAB — ADAMTS13 ACTIVITY: Adamts 13 Activity: 81.3 % (ref >66.8)

## 2022-07-26 LAB — ADAMTS13 ACTIVITY REFLEX

## 2022-07-29 ENCOUNTER — Telehealth: Payer: Self-pay | Admitting: *Deleted

## 2022-07-29 NOTE — Telephone Encounter (Signed)
Provided Eric Hooper appointment w/dermatology 09/07/22 at 1:15/1:30 on 3rd floor. Also provided practice phone # in case he needs to reschedule.

## 2022-08-03 ENCOUNTER — Encounter: Payer: Self-pay | Admitting: Oncology

## 2022-08-07 ENCOUNTER — Ambulatory Visit: Payer: Medicaid Other | Admitting: Family Medicine

## 2022-08-07 NOTE — Progress Notes (Deleted)
    SUBJECTIVE:   CHIEF COMPLAINT / HPI:   Hypertension: Patient is a 47 y.o. male who presents today for HTN follow-up.  Home medications include: amlodipine 5mg  daily and losartan-HCTZ 100-12.5mg  daily (increased from 50-12.5mg  a few weeks ago) Patient reports *** compliance.  Patient {rwdoesdoesnot:[id number]} check blood pressure at home.  Patient {HAS HAS OEH:[id number]} had a BMP in the past 1 year.   PERTINENT  PMH / PSH: ***  OBJECTIVE:   There were no vitals taken for this visit.  ***  ASSESSMENT/PLAN:   No problem-specific Assessment & Plan notes found for this encounter.     Maury Dus, MD [facility]

## 2022-08-13 ENCOUNTER — Ambulatory Visit (HOSPITAL_COMMUNITY)
Admission: EM | Admit: 2022-08-13 | Discharge: 2022-08-13 | Disposition: A | Discharge status: Home / Self Care | Payer: Medicaid Other | Attending: Internal Medicine | Admitting: Internal Medicine

## 2022-08-13 ENCOUNTER — Encounter (HOSPITAL_COMMUNITY): Payer: Self-pay

## 2022-08-13 DIAGNOSIS — T7840XA Allergy, unspecified, initial encounter: Secondary | ICD-10-CM | POA: Diagnosis not present

## 2022-08-13 DIAGNOSIS — E11628 Type 2 diabetes mellitus with other skin complications: Secondary | ICD-10-CM | POA: Diagnosis not present

## 2022-08-13 DIAGNOSIS — T63441A Toxic effect of venom of bees, accidental (unintentional), initial encounter: Secondary | ICD-10-CM | POA: Diagnosis not present

## 2022-08-13 DIAGNOSIS — Z87892 Personal history of anaphylaxis: Secondary | ICD-10-CM | POA: Diagnosis not present

## 2022-08-13 MED ORDER — DEXAMETHASONE SODIUM PHOSPHATE 10 MG/ML IJ SOLN
INTRAMUSCULAR | Status: AC
  Filled 2022-08-13: qty 1

## 2022-08-13 MED ORDER — EPINEPHRINE 0.3 MG/0.3ML IJ SOAJ: 0.3000 mg | INTRAMUSCULAR | 0 refills | Status: AC | PRN

## 2022-08-13 MED ORDER — CETIRIZINE HCL 10 MG PO TABS: 10.0000 mg | ORAL_TABLET | Freq: Every day | ORAL | 0 refills | Status: DC

## 2022-08-13 MED ORDER — DEXAMETHASONE SODIUM PHOSPHATE 10 MG/ML IJ SOLN
10.0000 mg | Freq: Once | INTRAMUSCULAR | Status: AC
  Administered 2022-08-13: 10 mg via INTRAMUSCULAR

## 2022-08-13 MED ORDER — FAMOTIDINE 20 MG PO TABS: 20.0000 mg | ORAL_TABLET | Freq: Every day | ORAL | 0 refills | Status: DC

## 2022-08-13 NOTE — ED Provider Notes (Addendum)
[facility]    CSN: [id number] Arrival date & time: 08/13/22  1508      History   Chief Complaint Chief Complaint  Patient presents with   Insect Bite    HPI Eric Hooper is a 47 y.o. male.   Patient presents to urgent care for evaluation of bee sting to the right chest wall that happened today while he was driving his car with his windows down.  He states that the bee flew into his car, stung him to his chest wall, and then he killed the bee.  He has a history of anaphylaxis to bee stings diagnosed greater than 10 years ago.  Not currently experiencing any chest pain, shortness of breath, tongue swelling, lip swelling, heart palpitations, or neck swelling.  Breathing normally without difficulty.  He has an EpiPen but states that his expired as it is very old and he was prescribed this EpiPen when he used to live in [place] when his bee allergy was first diagnosed.  He has not needed to use his EpiPen recently but has had to use it in the past for bee sting.  Reports itching sensation to the lips and to the chest where the bee sting is located.  Reports immediate swelling and erythema at the site of the sting.  Has not had any recent steroids or antibiotics.  History of diabetes.  Diabetes is well-controlled.  Last A1c was 6.9 (per chart review).  He has not attempted use of any over-the-counter medications to help with symptoms before coming to urgent care.     Past Medical History:  Diagnosis Date   At risk for dysfunction of heart 07/11/2018   Chest wall pain 08/14/2018   Controlled diabetes mellitus type 2 with complications 02/25/2018   Diabetes mellitus without complication    non-insulin dependent   Diverticulosis 02/01/2017   on CT scan abd/pelvis   GERD (gastroesophageal reflux disease)    Hypertension    Kidney stones 02/03/2017   Leg swelling 05/12/2017   Macrocytic anemia 02/01/2017   Microscopic hematuria 02/01/2017   Mixed hyperlipidemia  02/28/2018   Phlebitis 05/12/2017   Renal insufficiency 02/03/2017   Smoker 04/13/2017    Patient Active Problem List   Diagnosis Date Noted   Nonintractable headache 07/23/2022   Cervical disc disorder with radiculopathy of cervical region 06/20/2020   Seasonal allergic rhinitis 03/05/2020   Chronic left shoulder pain 11/27/2019   GERD (gastroesophageal reflux disease) 08/10/2019   Hypertension 08/10/2019   Insomnia 06/09/2019   PICC (peripherally inserted central catheter) in place 09/05/2018   Mixed hyperlipidemia 02/28/2018   Type 2 diabetes mellitus without complications 02/25/2018   Tobacco use disorder 04/13/2017   T.T.P. syndrome 02/06/2017   Renal insufficiency 02/03/2017   Thrombocytopenia 02/01/2017   Diabetes mellitus 02/01/2017   Diverticulosis 02/01/2017    Past Surgical History:  Procedure Laterality Date   ELBOW SURGERY Right    IR FLUORO GUIDE CV LINE RIGHT  02/02/2017   IR FLUORO GUIDE CV LINE RIGHT  11/23/2021   IR FLUORO GUIDE CV LINE RIGHT  11/28/2021   IR REMOVAL TUN CV CATH W/O FL  02/26/2022   IR US GUIDE VASC ACCESS RIGHT  02/02/2017   IR US GUIDE VASC ACCESS RIGHT  11/23/2021       Home Medications    Prior to Admission medications   Medication Sig Start Date End Date Taking? Authorizing Provider  acetaminophen (TYLENOL) 325 MG tablet Take 2 tablets (650 mg  total) by mouth every 4 (four) hours as needed (discomfort or fever). 11/29/21  Yes Jerre Simon, MD  albuterol (VENTOLIN HFA) 108 (90 Base) MCG/ACT inhaler INHALE TWO PUFFS INTO THE LUNGS EVERY 6 HOURS AS NEEDED. 05/21/22  Yes Lilland, Alana, DO  amLODipine (NORVASC) 5 MG tablet TAKE 1 TABLET BY MOUTH EVERYDAY AT BEDTIME 06/01/22  Yes Lilland, Alana, DO  atorvastatin (LIPITOR) 40 MG tablet TAKE 1 TABLET BY MOUTH EVERY DAY 02/09/22  Yes Lilland, Alana, DO  benzoyl peroxide 5 % gel Apply topically 2 (two) times daily. 02/23/22  Yes Lilland, Alana, DO  blood glucose meter kit and supplies KIT Dispense  based on patient and insurance preference. Use up to four times daily as directed. (FOR ICD-9 250.00, 250.01). 08/25/18  Yes Garnette Gunner, MD  blood glucose meter kit and supplies Dispense based on patient and insurance preference. Use up to four times daily as directed. (FOR ICD-10 E10.9, E11.9). 09/02/18  Yes Garnette Gunner, MD  cetirizine (ZYRTEC ALLERGY) 10 MG tablet Take 1 tablet (10 mg total) by mouth daily. 08/13/22  Yes Carlisle Beers, FNP  empagliflozin (JARDIANCE) 10 MG TABS tablet Take 1 tablet (10 mg total) by mouth daily. 06/09/22  Yes Lilland, Alana, DO  EPINEPHrine 0.3 mg/0.3 mL IJ SOAJ injection Inject 0.3 mg into the muscle as needed for anaphylaxis. 08/13/22  Yes Carlisle Beers, FNP  famotidine (PEPCID) 20 MG tablet Take 1 tablet (20 mg total) by mouth daily. 08/13/22  Yes Carlisle Beers, FNP  KLOR-CON M20 20 MEQ tablet TAKE 1 TABLET BY MOUTH EVERY DAY 06/16/22  Yes Ladene Artist, MD  losartan-hydrochlorothiazide (HYZAAR) 100-12.5 MG tablet Take 1 tablet by mouth daily. 07/23/22  Yes Maury Dus, MD    Family History Family History  Problem Relation Age of Onset   Hypertension Mother    Hyperlipidemia Mother    Diabetes type II Father    Colon cancer Maternal Aunt     Social History Social History   Tobacco Use   Smoking status: Every Day    Packs/day: 0.50    Years: 15.00    Additional pack years: 0.00    Total pack years: 7.50    Types: Cigarettes    Passive exposure: Current   Smokeless tobacco: Never  Vaping Use   Vaping Use: Never used  Substance Use Topics   Alcohol use: Yes    Comment: 3-4 beers per day most days   Drug use: No     Allergies   Ibuprofen   Review of Systems Review of Systems Per HPI  Physical Exam Triage Vital Signs ED Triage Vitals [08/13/22 1603]  Enc Vitals Group     BP (!) 161/96     Pulse Rate 75     Resp 18     Temp (!) 97.5 F (36.4 C)     Temp Source Oral     SpO2 97 %     Weight       Height      Head Circumference      Peak Flow      Pain Score      Pain Loc      Pain Edu?      Excl. in GC?    No data found.  Updated Vital Signs BP (!) 161/96 (BP Location: Left Arm)   Pulse 75   Temp (!) 97.5 F (36.4 C) (Oral)   Resp 18   SpO2 97%   Visual Acuity  Right Eye Distance:   Left Eye Distance:   Bilateral Distance:    Right Eye Near:   Left Eye Near:    Bilateral Near:     Physical Exam Vitals and nursing note reviewed.  Constitutional:      Appearance: He is not ill-appearing or toxic-appearing.  HENT:     Head: Normocephalic and atraumatic.     Right Ear: Hearing, tympanic membrane, ear canal and external ear normal.     Left Ear: Hearing, tympanic membrane, ear canal and external ear normal.     Nose: Nose normal.     Mouth/Throat:     Lips: Pink.     Mouth: Mucous membranes are moist. No injury.     Tongue: No lesions. Tongue does not deviate from midline.     Palate: No mass and lesions.     Pharynx: Oropharynx is clear. Uvula midline. No pharyngeal swelling, oropharyngeal exudate, posterior oropharyngeal erythema or uvula swelling.     Tonsils: No tonsillar exudate or tonsillar abscesses.     Comments: No posterior oropharyngeal swelling.  No tongue and lip swelling. Eyes:     General: Lids are normal. Vision grossly intact. Gaze aligned appropriately.     Extraocular Movements: Extraocular movements intact.     Conjunctiva/sclera: Conjunctivae normal.  Cardiovascular:     Rate and Rhythm: Normal rate and regular rhythm.     Heart sounds: Normal heart sounds, S1 normal and S2 normal.  Pulmonary:     Effort: Pulmonary effort is normal. No respiratory distress.     Breath sounds: Normal breath sounds and air entry. No stridor. No wheezing, rhonchi or rales.     Comments: Speaking in full sentences without difficulty or increased respiratory effort. Chest:     Chest wall: No tenderness.       Comments: Bee sting localized to the right  upper chest wall with surrounding area of erythema, warmth, and swelling to the soft tissues.  See image below for further detail.  No evidence of stinger present. Musculoskeletal:     Cervical back: Neck supple.  Lymphadenopathy:     Cervical: No cervical adenopathy.  Skin:    General: Skin is warm and dry.     Capillary Refill: Capillary refill takes less than 2 seconds.     Findings: No rash.  Neurological:     General: No focal deficit present.     Mental Status: He is alert and oriented to person, place, and time. Mental status is at baseline.     Cranial Nerves: No dysarthria or facial asymmetry.  Psychiatric:        Mood and Affect: Mood normal.        Speech: Speech normal.        Behavior: Behavior normal.        Thought Content: Thought content normal.        Judgment: Judgment normal.      UC Treatments / Results  Labs (all labs ordered are listed, but only abnormal results are displayed) Labs Reviewed - No data to display  EKG   Radiology No results found.  Procedures Procedures (including critical care time)  Medications Ordered in UC Medications  dexamethasone (DECADRON) injection 10 mg (10 mg Intramuscular Given 08/13/22 1634)    Initial Impression / Assessment and Plan / UC Course  I have reviewed the triage vital signs and the nursing notes.  Pertinent labs & imaging results that were available during my care of the patient were  reviewed by me and considered in my medical decision making (see chart for details).   1.  Allergic reaction, bee sting, history of anaphylaxis No signs or symptoms of anaphylaxis or indication for EpiPen use.  Airway is intact.  Dexamethasone 10 mg IM given and will continue to work in the body over the next 48 to 72 hours.  Patient is a diabetic and has been advised to watch his sugars over the next few days as this will temporarily spike his blood sugar.  Patient to start taking Pepcid and Zyrtec once daily for the next 7 to  10 days to further suppress allergic reaction.  Starting tomorrow, may apply triamcinolone to the area of greatest itch to the chest twice daily for the next 10 days.  He already has triamcinolone cream at home and has been advised to use this for up to 7 days due to skin thinning.  EpiPen refill sent to pharmacy due to history of anaphylaxis from bee stings.  Discussed indications for use of EpiPen and indication for 911 call after he uses this.  May follow-up with allergy and asthma specialist as needed.  He is agreeable with plan.  Strict urgent care and ER return precautions discussed should he develop any signs or symptoms of infection related to the bee sting as he is at increased risk for infection due to type 2 diabetes diagnosis.  Vital signs are hemodynamically stable and he is neurovascularly intact to his baseline.   Discussed physical exam and available lab work findings in clinic with patient.  Counseled patient regarding appropriate use of medications and potential side effects for all medications recommended or prescribed today. Discussed red flag signs and symptoms of worsening condition,when to call the PCP office, return to urgent care, and when to seek higher level of care in the emergency department. Patient verbalizes understanding and agreement with plan. All questions answered. Patient discharged in stable condition.    Final Clinical Impressions(s) / UC Diagnoses   Final diagnoses:  Allergic reaction, initial encounter  Bee sting, accidental or unintentional, initial encounter  History of anaphylaxis  Type 2 diabetes mellitus with other skin complication, without long-term current use of insulin     Discharge Instructions      You have been evaluated today for an allergic reaction. We gave you medicine to help with symptoms.   Take medications sent to pharmacy as directed (Zyrtec and Pepcid once daily for 7 days).    Starting tomorrow, you may apply triamcinolone cream  that you have at home to the area of greatest swelling and itching to your chest twice daily for the next 7 days.  Do not use this for longer than 7 days as this can cause skin thinning.  I gave you a steroid shot in the clinic today which will help with your overall itching and swelling related to the bee sting.  I have refilled your EpiPen so that you have an up-to-date EpiPen at home in case you are stung by another bee and experience symptoms consistent with anaphylaxis (lip or tongue swelling, shortness of breath, or throat closure sensation).  If you have to use your EpiPen, it is imperative that you call 911 and have ambulance take you to the hospital for monitoring.  Call the allergy and asthma specialist listed on your paperwork to schedule an appointment for follow-up.  Please schedule an appointment with your primary care provider for follow-up and ongoing management. Return if you experience rashes, difficulty breathing  or swallowing, lip/mouth/tongue swelling, vomiting, or for any other concerning symptoms. If symptoms are severe, please go to the ER for further workup. I hope you feel better!      ED Prescriptions     Medication Sig Dispense Auth. Provider   famotidine (PEPCID) 20 MG tablet Take 1 tablet (20 mg total) by mouth daily. 7 tablet Carlisle Beers, FNP   cetirizine (ZYRTEC ALLERGY) 10 MG tablet Take 1 tablet (10 mg total) by mouth daily. 7 tablet Reita May M, FNP   EPINEPHrine 0.3 mg/0.3 mL IJ SOAJ injection Inject 0.3 mg into the muscle as needed for anaphylaxis. 1 each Carlisle Beers, FNP      PDMP not reviewed this encounter.   Carlisle Beers, FNP 08/13/22 1637    Carlisle Beers, FNP 08/13/22 [phone]

## 2022-08-13 NOTE — ED Triage Notes (Signed)
Pt presents for bee on the chest. Pt reports he is allergic to bees. Pt reports redness and swelling.

## 2022-08-13 NOTE — Discharge Instructions (Signed)
You have been evaluated today for an allergic reaction. We gave you medicine to help with symptoms.   Take medications sent to pharmacy as directed (Zyrtec and Pepcid once daily for 7 days).    Starting tomorrow, you may apply triamcinolone cream that you have at home to the area of greatest swelling and itching to your chest twice daily for the next 7 days.  Do not use this for longer than 7 days as this can cause skin thinning.  I gave you a steroid shot in the clinic today which will help with your overall itching and swelling related to the bee sting.  I have refilled your EpiPen so that you have an up-to-date EpiPen at home in case you are stung by another bee and experience symptoms consistent with anaphylaxis (lip or tongue swelling, shortness of breath, or throat closure sensation).  If you have to use your EpiPen, it is imperative that you call 911 and have ambulance take you to the hospital for monitoring.  Call the allergy and asthma specialist listed on your paperwork to schedule an appointment for follow-up.  Please schedule an appointment with your primary care provider for follow-up and ongoing management. Return if you experience rashes, difficulty breathing or swallowing, lip/mouth/tongue swelling, vomiting, or for any other concerning symptoms. If symptoms are severe, please go to the ER for further workup. I hope you feel better!

## 2022-08-21 ENCOUNTER — Encounter: Payer: Self-pay | Admitting: Oncology

## 2022-09-07 ENCOUNTER — Ambulatory Visit: Payer: Medicaid Other | Admitting: Dermatology

## 2022-09-12 ENCOUNTER — Other Ambulatory Visit (HOSPITAL_COMMUNITY): Payer: Self-pay

## 2022-09-12 ENCOUNTER — Telehealth: Payer: Self-pay

## 2022-09-12 ENCOUNTER — Encounter: Payer: Self-pay | Admitting: Oncology

## 2022-09-12 NOTE — Telephone Encounter (Signed)
Unsure why previous PA isnt still available.   A Prior Authorization was initiated for this patients JARDIANCE through CoverMyMeds.   Key: [id number]

## 2022-09-13 ENCOUNTER — Other Ambulatory Visit: Payer: Self-pay | Admitting: Family Medicine

## 2022-09-13 DIAGNOSIS — E782 Mixed hyperlipidemia: Secondary | ICD-10-CM

## 2022-09-14 ENCOUNTER — Other Ambulatory Visit (HOSPITAL_COMMUNITY): Payer: Self-pay

## 2022-09-14 NOTE — Telephone Encounter (Signed)
PA deemed n/a. Previous PA still approved until 06/16/23.   Test claim now paying for medication.  Contacted pharmacy, medication being filled.

## 2022-09-15 ENCOUNTER — Telehealth: Payer: Self-pay | Admitting: Oncology

## 2022-09-15 NOTE — Telephone Encounter (Signed)
Return call to confirm date and time of appointment

## 2022-09-18 ENCOUNTER — Inpatient Hospital Stay (HOSPITAL_BASED_OUTPATIENT_CLINIC_OR_DEPARTMENT_OTHER): Payer: Medicaid Other | Admitting: Oncology

## 2022-09-18 ENCOUNTER — Inpatient Hospital Stay: Payer: Medicaid Other | Attending: Oncology

## 2022-09-18 VITALS — BP 142/80 | HR 77 | Temp 98.1°F | Resp 19 | Ht 68.0 in | Wt 198.4 lb

## 2022-09-18 DIAGNOSIS — M3119 Other thrombotic microangiopathy: Secondary | ICD-10-CM | POA: Insufficient documentation

## 2022-09-18 LAB — CMP (CANCER CENTER ONLY)
ALT: 33 U/L (ref 0–44)
AST: 21 U/L (ref 15–41)
Albumin: 4.3 g/dL (ref 3.5–5.0)
Alkaline Phosphatase: 117 U/L (ref 38–126)
Anion gap: 8 (ref 5–15)
BUN: 13 mg/dL (ref 6–20)
CO2: 25 mmol/L (ref 22–32)
Calcium: 9.2 mg/dL (ref 8.9–10.3)
Chloride: 106 mmol/L (ref 98–111)
Creatinine: 0.81 mg/dL (ref 0.61–1.24)
GFR, Estimated: >60 mL/min (ref >60)
Glucose, Bld: 184 mg/dL — ABNORMAL HIGH (ref 70–99)
Potassium: 3.5 mmol/L (ref 3.5–5.1)
Sodium: 139 mmol/L (ref 135–145)
Total Bilirubin: 0.9 mg/dL (ref 0.3–1.2)
Total Protein: 6.8 g/dL (ref 6.5–8.1)

## 2022-09-18 LAB — CBC WITH DIFFERENTIAL (CANCER CENTER ONLY)
Abs Immature Granulocytes: 0.02 10*3/uL (ref 0.00–0.07)
Basophils Absolute: 0.1 10*3/uL (ref 0.0–0.1)
Basophils Relative: 1 %
Eosinophils Absolute: 0.3 10*3/uL (ref 0.0–0.5)
Eosinophils Relative: 3 %
HCT: 51.3 % (ref 39.0–52.0)
Hemoglobin: 17.8 g/dL — ABNORMAL HIGH (ref 13.0–17.0)
Immature Granulocytes: 0 %
Lymphocytes Relative: 43 %
Lymphs Abs: 4.6 10*3/uL — ABNORMAL HIGH (ref 0.7–4.0)
MCH: 30.8 pg (ref 26.0–34.0)
MCHC: 34.7 g/dL (ref 30.0–36.0)
MCV: 88.8 fL (ref 80.0–100.0)
Monocytes Absolute: 0.8 10*3/uL (ref 0.1–1.0)
Monocytes Relative: 8 %
Neutro Abs: 4.8 10*3/uL (ref 1.7–7.7)
Neutrophils Relative %: 45 %
Platelet Count: 196 10*3/uL (ref 150–400)
RBC: 5.78 MIL/uL (ref 4.22–5.81)
RDW: 13.8 % (ref 11.5–15.5)
WBC Count: 10.6 10*3/uL — ABNORMAL HIGH (ref 4.0–10.5)
nRBC: 0 % (ref 0.0–0.2)

## 2022-09-18 LAB — LACTATE DEHYDROGENASE: LDH: 201 U/L — ABNORMAL HIGH (ref 98–192)

## 2022-09-18 NOTE — Progress Notes (Signed)
[facility] OFFICE PROGRESS NOTE   Diagnosis: TTP  INTERVAL HISTORY:   Eric Hooper as scheduled.  He feels well.  No bleeding.  He had a bee sting last month.  He is allergic to bee stings.  He was seen in the emergency room and received steroids and antihistamine therapy.  He continues to have hyperpigmented lesions over the trunk.  He is scheduled to see dermatology next month  Objective:  Vital signs in last 24 hours:  Blood pressure (!) 142/80, pulse 77, temperature 98.1 F (36.7 C), resp. rate 19, height 5\' 8"  (1.727 m), weight 198 lb 6.4 oz (90 kg), SpO2 100 %.     Resp: Lungs clear bilaterally Cardio: Regular rhythm with premature beats GI: No hepatosplenomegaly Vascular: No leg edema  Skin: Upper pigmented lesions over the trunk  Portacath/PICC-without erythema  Lab Results:  Lab Results  Component Value Date   WBC 10.6 (H) 09/18/2022   HGB 17.8 (H) 09/18/2022   HCT 51.3 09/18/2022   MCV 88.8 09/18/2022   PLT 196 09/18/2022   NEUTROABS 4.8 09/18/2022    CMP  Lab Results  Component Value Date   NA 141 07/24/2022   K 3.7 07/24/2022   CL 106 07/24/2022   CO2 28 07/24/2022   GLUCOSE 151 (H) 07/24/2022   BUN 15 07/24/2022   CREATININE 0.86 07/24/2022   CALCIUM 9.5 07/24/2022   PROT 6.7 07/24/2022   ALBUMIN 4.3 07/24/2022   AST 21 07/24/2022   ALT 33 07/24/2022   ALKPHOS 92 07/24/2022   BILITOT 0.9 07/24/2022   GFRNONAA >60 07/24/2022   GFRAA 125 06/14/2020    Medications: I have reviewed the patient's current medications.   Assessment/Plan: TTP initially diagnosed in October 2018 with recurrence in April 2020 and July 2023. Initiation of daily plasma exchange and Solu-Medrol 02/02/2017 [name] Antibody- 72, activity less than 2% on initial presentation; [name] from 08/15/2018 <2.0. Last plasma exchange 02/06/2017 Prednisone 60 mg daily beginning 02/08/2017 Prednisone 40 mg daily beginning 02/17/2017 Prednisone 30 mg daily  beginning 03/09/2017 Prednisone 20 mg daily beginning 03/23/2017 Prednisone 10 mg daily beginning 04/01/2017 Prednisone 5 mg daily for 5 days then 5 mg every other day for 5 doses then stop beginning 05/11/2017 Recurrent TTP 08/14/2018 Plasma exchange/steroids started on 08/15/2018.  Plasma exchange discontinued after treatment on 08/19/2019, discharged on prednisone 60mg  daily Prednisone taper to 40 mg daily 08/22/2018 Weekly rituximab for 4 doses beginning 08/25/2018 Plasma exchange resumed 08/26/2018, 08/27/2018, 08/28/2018, 08/29/2018, 08/31/2018, 09/02/2018 Prednisone taper to 30 mg daily 09/01/2018 Cycle 3 weekly Rituxan 09/08/2018 Prednisone taper to 20 mg daily 09/08/2018 Cycle 4 weekly Rituxan 09/15/2018 Prednisone taper to 10 mg daily 09/20/2018 Prednisone taper to 5 mg daily 09/27/2018 Prednisone discontinued 10/07/2018 Admission with relapse of TTP 10/23/2021 Daily Plasma exchange initiated 11/23/2021 prednisone 8/1 Prednisone tapered to 40 mg daily beginning 12/04/2021 Weekly Rituxan for 4 doses beginning 12/04/2021 Discontinue plasmapheresis after appointment 12/05/2021 Admitted to [facility] 12/11/2021-plasmapheresis reinitiated, Cablivi started 12/12/2021, prednisone dose currently 80 mg daily Every other day plasma exchange beginning 12/28/2021-completed 01/09/2022 Second weekly Rituxan 12/31/2021 Prednisone taper to 60 mg daily beginning 12/31/2021 Third weekly Rituxan 01/06/2022 Prednisone taper to 40 mg daily beginning 01/07/2022 Fourth weekly rituximab 01/13/2022 Prednisone taper to 30 mg daily 01/13/2022 Prednisone taper to 20 mg daily 01/20/2022 Prednisone taper to 15 mg daily 02/04/2022 Per patient report 02/19/2022 he has continued prednisone 30 mg daily and did not make the above adjustments, he will decrease prednisone to 20 mg daily  today Prednisone taper to 10 mg daily 03/09/2022 Prednisone taper to 5 mg daily 03/26/2022 Prednisone taper to 5 mg every other day for 5 doses beginning 04/16/2022 2.  Xiphoid pain-etiology unclear 3. Hypertension 4. Diabetes 5. Alcohol/Tobacco use 6. History of renal insufficiency 7. Right IJ dialysis catheter placed 02/02/2017, removed New right IJ dialysis catheter 08/15/2018 Catheter removed 09/12/2018 New right IJ dialysis catheter placed 11/23/2021 Temporary hemodialysis catheter converted to tunneled hemodialysis catheter 11/28/2021         Disposition: Eric Hooper is in clinical remission from TTP.  We will follow-up on the ADAMTS level from today.  He will return for an office and lab visit in 3 months.  He will call in the interim for new symptoms.  Eric Papas, MD  09/18/2022  8:28 AM

## 2022-09-20 LAB — ADAMTS13 ACTIVITY: Adamts 13 Activity: 72.5 % (ref >66.8)

## 2022-09-20 LAB — ADAMTS13 ACTIVITY REFLEX

## 2022-09-23 ENCOUNTER — Ambulatory Visit (INDEPENDENT_AMBULATORY_CARE_PROVIDER_SITE_OTHER): Payer: Medicaid Other

## 2022-09-23 ENCOUNTER — Encounter (HOSPITAL_COMMUNITY): Payer: Self-pay

## 2022-09-23 ENCOUNTER — Encounter: Payer: Self-pay | Admitting: Oncology

## 2022-09-23 ENCOUNTER — Ambulatory Visit (HOSPITAL_COMMUNITY)
Admission: EM | Admit: 2022-09-23 | Discharge: 2022-09-23 | Disposition: A | Discharge status: Home / Self Care | Payer: Medicaid Other | Attending: Emergency Medicine | Admitting: Emergency Medicine

## 2022-09-23 DIAGNOSIS — M778 Other enthesopathies, not elsewhere classified: Secondary | ICD-10-CM | POA: Diagnosis not present

## 2022-09-23 DIAGNOSIS — I10 Essential (primary) hypertension: Secondary | ICD-10-CM | POA: Diagnosis not present

## 2022-09-23 MED ORDER — TRIAMCINOLONE ACETONIDE 40 MG/ML IJ SUSP
60.0000 mg | Freq: Once | INTRAMUSCULAR | Status: AC
  Administered 2022-09-23: 60 mg via INTRAMUSCULAR

## 2022-09-23 MED ORDER — DICLOFENAC SODIUM 1 % EX GEL: 2.0000 g | Freq: Four times a day (QID) | CUTANEOUS | 0 refills | Status: DC

## 2022-09-23 MED ORDER — TRIAMCINOLONE ACETONIDE 40 MG/ML IJ SUSP
INTRAMUSCULAR | Status: AC
  Filled 2022-09-23: qty 2

## 2022-09-23 NOTE — ED Provider Notes (Signed)
[facility]    CSN: [id number] Arrival date & time: 09/23/22  1359      History   Chief Complaint Chief Complaint  Patient presents with   Shoulder Pain    HPI Eric Hooper is a 47 y.o. male.   Patient presents to clinic for concerns of left shoulder pain as well as hypertension.  He was pressure washing his and his mother's house yesterday when his left shoulder started to hurt him.  Pain with internal arm adduction, limitation to range of motion.  He denies any falls or trauma.  Denies any injuries to the shoulder previously.  He went to the dentist this morning and they checked his blood pressure with a wrist cuff, he was told that his blood pressure was too high to complete his dental work. BP with a wrist cuff was 150s/102, then 156/111, then 177/117.  Reports his blood pressure kept increasing because he kept getting aggravated.  His doctor who he sees for his TTP recommends manual cuff only, as patient gets anxious/irritated with automatic cuffs.  This dentist office did not carry a manual cuff.  They refused to complete his care this morning.  Reports compliance with both of his blood pressure medications. BP in clinic was 140/80 manually, this is normal for him.    The history is provided by the patient and medical records.  Shoulder Pain   Past Medical History:  Diagnosis Date   At risk for dysfunction of heart 07/11/2018   Chest wall pain 08/14/2018   Controlled diabetes mellitus type 2 with complications (HCC) 02/25/2018   Diabetes mellitus without complication (HCC)    non-insulin dependent   Diverticulosis 02/01/2017   on CT scan abd/pelvis   GERD (gastroesophageal reflux disease)    Hypertension    Kidney stones 02/03/2017   Leg swelling 05/12/2017   Macrocytic anemia 02/01/2017   Microscopic hematuria 02/01/2017   Mixed hyperlipidemia 02/28/2018   Phlebitis 05/12/2017   Renal insufficiency 02/03/2017   Smoker 04/13/2017    Patient Active  Problem List   Diagnosis Date Noted   Nonintractable headache 07/23/2022   Cervical disc disorder with radiculopathy of cervical region 06/20/2020   Seasonal allergic rhinitis 03/05/2020   Chronic left shoulder pain 11/27/2019   GERD (gastroesophageal reflux disease) 08/10/2019   Hypertension 08/10/2019   Insomnia 06/09/2019   PICC (peripherally inserted central catheter) in place 09/05/2018   Mixed hyperlipidemia 02/28/2018   Type 2 diabetes mellitus without complications (HCC) 02/25/2018   Tobacco use disorder 04/13/2017   T.T.P. syndrome (HCC) 02/06/2017   Renal insufficiency 02/03/2017   Thrombocytopenia (HCC) 02/01/2017   Diabetes mellitus (HCC) 02/01/2017   Diverticulosis 02/01/2017    Past Surgical History:  Procedure Laterality Date   ELBOW SURGERY Right    IR FLUORO GUIDE CV LINE RIGHT  02/02/2017   IR FLUORO GUIDE CV LINE RIGHT  11/23/2021   IR FLUORO GUIDE CV LINE RIGHT  11/28/2021   IR REMOVAL TUN CV CATH W/O FL  02/26/2022   IR US GUIDE VASC ACCESS RIGHT  02/02/2017   IR US GUIDE VASC ACCESS RIGHT  11/23/2021       Home Medications    Prior to Admission medications   Medication Sig Start Date End Date Taking? Authorizing Provider  albuterol (VENTOLIN HFA) 108 (90 Base) MCG/ACT inhaler INHALE TWO PUFFS INTO THE LUNGS EVERY 6 HOURS AS NEEDED. 05/21/22  Yes Lilland, Alana, DO  amLODipine (NORVASC) 5 MG tablet TAKE 1 TABLET BY MOUTH EVERYDAY AT  BEDTIME 06/01/22  Yes Lilland, Alana, DO  atorvastatin (LIPITOR) 40 MG tablet TAKE 1 TABLET BY MOUTH EVERY DAY 09/14/22  Yes Lilland, Alana, DO  blood glucose meter kit and supplies KIT Dispense based on patient and insurance preference. Use up to four times daily as directed. (FOR ICD-9 250.00, 250.01). 08/25/18  Yes Garnette Gunner, MD  blood glucose meter kit and supplies Dispense based on patient and insurance preference. Use up to four times daily as directed. (FOR ICD-10 E10.9, E11.9). 09/02/18  Yes Garnette Gunner, MD   cetirizine (ZYRTEC ALLERGY) 10 MG tablet Take 1 tablet (10 mg total) by mouth daily. 08/13/22  Yes Carlisle Beers, FNP  diclofenac Sodium (VOLTAREN) 1 % GEL Apply 2 g topically 4 (four) times daily. 09/23/22  Yes Rinaldo Ratel, [place] N, FNP  empagliflozin (JARDIANCE) 10 MG TABS tablet Take 1 tablet (10 mg total) by mouth daily. 06/09/22  Yes Lilland, Alana, DO  famotidine (PEPCID) 20 MG tablet Take 1 tablet (20 mg total) by mouth daily. 08/13/22  Yes Carlisle Beers, FNP  KLOR-CON M20 20 MEQ tablet TAKE 1 TABLET BY MOUTH EVERY DAY 06/16/22  Yes Ladene Artist, MD  losartan-hydrochlorothiazide (HYZAAR) 100-12.5 MG tablet Take 1 tablet by mouth daily. 07/23/22  Yes Maury Dus, MD  acetaminophen (TYLENOL) 325 MG tablet Take 2 tablets (650 mg total) by mouth every 4 (four) hours as needed (discomfort or fever). 11/29/21   Jerre Simon, MD  benzoyl peroxide 5 % gel Apply topically 2 (two) times daily. 02/23/22   Lilland, Alana, DO  EPINEPHrine 0.3 mg/0.3 mL IJ SOAJ injection Inject 0.3 mg into the muscle as needed for anaphylaxis. Patient not taking: Reported on 09/18/2022 08/13/22   Carlisle Beers, FNP  Multiple Vitamin (MULTIVITAMIN) capsule Take 1 capsule by mouth daily.    [provider]    Family History Family History  Problem Relation Age of Onset   Hypertension Mother    Hyperlipidemia Mother    Diabetes type II Father    Colon cancer Maternal Aunt     Social History Social History   Tobacco Use   Smoking status: Every Day    Packs/day: 0.50    Years: 15.00    Additional pack years: 0.00    Total pack years: 7.50    Types: Cigarettes    Passive exposure: Current   Smokeless tobacco: Never  Vaping Use   Vaping Use: Never used  Substance Use Topics   Alcohol use: Yes    Comment: 3-4 beers per day most days   Drug use: No     Allergies   Ibuprofen   Review of Systems Review of Systems  Musculoskeletal:  Positive for arthralgias. Negative for  joint swelling.     Physical Exam Triage Vital Signs ED Triage Vitals  Enc Vitals Group     BP 09/23/22 1440 (!) 142/80     Pulse Rate 09/23/22 1434 69     Resp 09/23/22 1440 18     Temp 09/23/22 1434 98.1 F (36.7 C)     Temp Source 09/23/22 1434 Oral     SpO2 09/23/22 1440 98 %     Weight --      Height --      Head Circumference --      Peak Flow --      Pain Score --      Pain Loc --      Pain Edu? --      Excl.  in GC? --    No data found.  Updated Vital Signs BP (!) 142/80 (BP Location: Left Arm)   Pulse 82   Temp 98.1 F (36.7 C) (Oral)   Resp 18   SpO2 98%   Visual Acuity Right Eye Distance:   Left Eye Distance:   Bilateral Distance:    Right Eye Near:   Left Eye Near:    Bilateral Near:     Physical Exam Vitals and nursing note reviewed.  Constitutional:      Appearance: Normal appearance.  HENT:     Head: Normocephalic and atraumatic.     Right Ear: External ear normal.     Left Ear: External ear normal.     Nose: Nose normal.     Mouth/Throat:     Mouth: Mucous membranes are moist.  Eyes:     Conjunctiva/sclera: Conjunctivae normal.  Cardiovascular:     Rate and Rhythm: Normal rate.  Pulmonary:     Effort: Pulmonary effort is normal. No respiratory distress.  Musculoskeletal:        General: Tenderness present. No swelling, deformity or signs of injury.     Right shoulder: Tenderness present. No swelling, deformity, effusion or laceration. Decreased range of motion. Normal strength. Normal pulse.       Arms:     Comments: Tenderness to left anterior shoulder, limitation to internal rotation, abduction and abduction.   Skin:    General: Skin is warm and dry.     Capillary Refill: Capillary refill takes less than 2 seconds.  Neurological:     General: No focal deficit present.     Mental Status: He is alert and oriented to person, place, and time.  Psychiatric:        Mood and Affect: Mood normal.        Behavior: Behavior normal.  Behavior is cooperative.      UC Treatments / Results  Labs (all labs ordered are listed, but only abnormal results are displayed) Labs Reviewed - No data to display  EKG   Radiology DG Shoulder Left  Result Date: 09/23/2022 CLINICAL DATA:  Right shoulder pain, limited range of motion EXAM: LEFT SHOULDER - 2+ VIEW COMPARISON:  None Available. FINDINGS: Mild to moderate degenerative AC joint spurring. Normal alignment.  No fracture or acute bony findings. Small nonspecific calcification anterior to the humeral head, nonspecific although possibly from mild calcific tendinopathy involving the subscapularis tendon. IMPRESSION: 1. Mild to moderate degenerative AC joint spurring. 2. Small nonspecific calcification anterior to the humeral head, possibly from mild calcific tendinopathy involving the subscapularis tendon. Electronically Signed   By: Gaylyn Rong M.D.   On: 09/23/2022 16:00    Procedures Procedures (including critical care time)  Medications Ordered in UC Medications  triamcinolone acetonide (KENALOG-40) injection 60 mg (has no administration in time range)    Initial Impression / Assessment and Plan / UC Course  I have reviewed the triage vital signs and the nursing notes.  Pertinent labs & imaging results that were available during my care of the patient were reviewed by me and considered in my medical decision making (see chart for details).  Vitals in triage reviewed, patient is hemodynamically stable.  Blood pressure is at baseline in clinic with manual reading.  Advised to have manual readings instead of automatic.  Left shoulder pain, TTP at anterior rotator cuff.  Reports ongoing pain to the left shoulder, worsened after a pressure washer the house.  Imaging shows no fx or  dislocation, did show potential tendinitis and AC joint spurring.  NSAIDs used with caution due to history of thrombocytopenia.  Given a dose of IM steroids in clinic and advised to take  Tylenol and ice as needed for pain, for orthopedic follow-up.  Patient verbalized understanding, no questions at this time.     Final Clinical Impressions(s) / UC Diagnoses   Final diagnoses:  Essential hypertension  Left shoulder tendinitis     Discharge Instructions      I recommend that all your providers, including your dentist, take your blood pressure manually for more accurate reading.  Your imaging was negative for fracture or dislocation.  It did show tendonitis, most likely of your rotator cuff. Please rest, ice or heat and use the anti-inflammatory cream to help with your pain and inflammation.  I withheld the oral NSAIDs for pain and inflammation due to your history of low platelets. If your symptoms persist beyond the next few weeks, I suggest following up with an orthopedic.  You can take Tylenol as needed for pain.  Please return to clinic or follow-up with your primary care provider for any new or concerning symptoms.      ED Prescriptions     Medication Sig Dispense Auth. Provider   diclofenac Sodium (VOLTAREN) 1 % GEL Apply 2 g topically 4 (four) times daily. 100 g Arali Somera, [place] N, FNP      PDMP not reviewed this encounter.   Nallely Yost, [place] N, [place] 09/23/22 [phone]

## 2022-09-23 NOTE — Discharge Instructions (Addendum)
I recommend that all your providers, including your dentist, take your blood pressure manually for more accurate reading.  Your imaging was negative for fracture or dislocation.  It did show tendonitis, most likely of your rotator cuff. Please rest, ice or heat and use the anti-inflammatory cream to help with your pain and inflammation.  I withheld the oral NSAIDs for pain and inflammation due to your history of low platelets. If your symptoms persist beyond the next few weeks, I suggest following up with an orthopedic.  You can take Tylenol as needed for pain.  Please return to clinic or follow-up with your primary care provider for any new or concerning symptoms.

## 2022-09-23 NOTE — ED Triage Notes (Signed)
Pt presents with left shoulder pain after power washing a house yesterday. Pt reports his blood pressure is elevated. I have advised patient to f/up with his PCP regarding changes to his blood pressure.

## 2022-09-24 ENCOUNTER — Encounter: Payer: Self-pay | Admitting: Oncology

## 2022-09-28 ENCOUNTER — Telehealth: Payer: Self-pay

## 2022-09-29 MED ORDER — LOSARTAN POTASSIUM-HCTZ 100-12.5 MG PO TABS: 1.0000 | ORAL_TABLET | Freq: Every day | ORAL | 0 refills | Status: DC

## 2022-09-29 NOTE — Telephone Encounter (Signed)
Losartan-HCTZ refill

## 2022-10-08 ENCOUNTER — Ambulatory Visit (INDEPENDENT_AMBULATORY_CARE_PROVIDER_SITE_OTHER): Payer: Medicaid Other | Admitting: Dermatology

## 2022-10-08 ENCOUNTER — Other Ambulatory Visit: Payer: Self-pay

## 2022-10-08 VITALS — BP 181/120 | HR 79

## 2022-10-08 DIAGNOSIS — L810 Postinflammatory hyperpigmentation: Secondary | ICD-10-CM

## 2022-10-08 DIAGNOSIS — L723 Sebaceous cyst: Secondary | ICD-10-CM | POA: Diagnosis not present

## 2022-10-08 DIAGNOSIS — L739 Follicular disorder, unspecified: Secondary | ICD-10-CM

## 2022-10-08 MED ORDER — TRETINOIN 0.05 % EX CREA
TOPICAL_CREAM | Freq: Every day | CUTANEOUS | 2 refills | Status: AC
Start: 2022-10-08 — End: 2023-10-08

## 2022-10-08 MED ORDER — ERYTHROMYCIN 2 % EX GEL: CUTANEOUS | 2 refills | Status: DC

## 2022-10-08 MED ORDER — CLINDAMYCIN PHOSPHATE 1 % EX LOTN
TOPICAL_LOTION | Freq: Every day | CUTANEOUS | 2 refills | Status: DC
Start: 2022-10-08 — End: 2022-10-08

## 2022-10-08 MED ORDER — TRIAMCINOLONE ACETONIDE 10 MG/ML IJ SUSP
10.0000 mg | Freq: Once | INTRAMUSCULAR | Status: AC
Start: 2022-10-08 — End: 2022-10-08
  Administered 2022-10-08: 10 mg

## 2022-10-08 NOTE — Progress Notes (Signed)
[facility] does not cover Clindamycin lotion. Sent Erythromycin gel, per Dr. Onalee Hua.

## 2022-10-08 NOTE — Progress Notes (Signed)
   New Patient Visit   Subjective  Eric Hooper is a 47 y.o. male who presents for the following: Spot Check  Patient states he  has Spots located at the scattered that he  would like to have examined. Patient reports the areas have been there for  6  month(s). he  reports the areas are bothersome. He states that the areas have spread. Patient reports has previously been treated for these areas.he has tried Triamcinolone and hydrocortisone, he was also on Prednisone  100mg  for 3 months. He has also tried over the counter Acne washes. Patient denies Hx of bx. Patient denies family history of skin cancer(s).    The following portions of the chart were reviewed this encounter and updated as appropriate: medications, allergies, medical history  Review of Systems:  No other skin or systemic complaints except as noted in HPI or Assessment and Plan.  Objective  Well appearing patient in no apparent distress; mood and affect are within normal limits.  A focused examination was performed of the following areas: Torso, Neck, Back  Relevant exam findings are noted in the Assessment and Plan.    Assessment & Plan   Folliculitis triggered by High doses of Prednisone Exam: perifollicular papules  Treatment Plan: - Recommended starting Cerva Acne 4% Foaming face wash, rinse and try  - Clindamycin Lotion mix with Cerave Lotion every night spot treat tretinoin Every other night for 1 month if tolerated well use nighty -Educated on importance of sunscreen to prevent the spots from darkening  Hyperpigmentation Exam: brown macules in the areas of prior acne lesions  Patient Education I counseled the patient regarding the following: Skin care: Recommend minimizing sun exposure, wearing sunscreen and protective clothing. / Expectations: Post Inflammatory pigmentary change is lighter or darker discoloration than surrounding skin resulting from trauma or rashes. Areas tend to normalize over time, but  can take months to years.  Treatment Plan: -topical retinoid started today will help lift some excess pigment -once skin is acclimated to retinoid we will add in a separate topical lightening agent -recommended broad spectrum SPF30 every day, hats and sun avoidence.   Inflamed Sebaceous  Cyst Exam: 10mm growth  Treatment plan: - Injected .1cc with Kenalog-10, patient tolerated well  No follow-ups on file.  Documentation: I have reviewed the above documentation for accuracy and completeness, and I agree with the above.  Stasia Cavalier, am acting as scribe for Langston Reusing, DO.  Langston Reusing, DO

## 2022-10-08 NOTE — Patient Instructions (Signed)
Due to recent changes in healthcare laws, you may see results of your pathology and/or laboratory studies on MyChart before the doctors have had a chance to review them. We understand that in some cases there may be results that are confusing or concerning to you. Please understand that not all results are received at the same time and often the doctors may need to interpret multiple results in order to provide you with the best plan of care or course of treatment. Therefore, we ask that you please give us 2 business days to thoroughly review all your results before contacting the office for clarification. Should we see a critical lab result, you will be contacted sooner.   If You Need Anything After Your Visit  If you have any questions or concerns for your doctor, please call our main line at [phone] If no one answers, please leave a voicemail as directed and we will return your call as soon as possible. Messages left after 4 pm will be answered the following business day.   You may also send us a message via MyChart. We typically respond to MyChart messages within 1-2 business days.  For prescription refills, please ask your pharmacy to contact our office. Our fax number is [phone].  If you have an urgent issue when the clinic is closed that cannot wait until the next business day, you can page your doctor at the number below.    Please note that while we do our best to be available for urgent issues outside of office hours, we are not available 24/7.   If you have an urgent issue and are unable to reach us, you may choose to seek medical care at your doctor's office, retail clinic, urgent care center, or emergency room.  If you have a medical emergency, please immediately call 911 or go to the emergency department. In the event of inclement weather, please call our main line at [phone] for an update on the status of any delays or closures.  Dermatology Medication Tips: Please  keep the boxes that topical medications come in in order to help keep track of the instructions about where and how to use these. Pharmacies typically print the medication instructions only on the boxes and not directly on the medication tubes.   If your medication is too expensive, please contact our office at [phone] or send us a message through MyChart.   We are unable to tell what your co-pay for medications will be in advance as this is different depending on your insurance coverage. However, we may be able to find a substitute medication at lower cost or fill out paperwork to get insurance to cover a needed medication.   If a prior authorization is required to get your medication covered by your insurance company, please allow us 1-2 business days to complete this process.  Drug prices often vary depending on where the prescription is filled and some pharmacies may offer cheaper prices.  The website www.goodrx.com contains coupons for medications through different pharmacies. The prices here do not account for what the cost may be with help from insurance (it may be cheaper with your insurance), but the website can give you the price if you did not use any insurance.  - You can print the associated coupon and take it with your prescription to the pharmacy.  - You may also stop by our office during regular business hours and pick up a GoodRx coupon card.  - If you need your   prescription sent electronically to a different pharmacy, notify our office through [facility] MyChart or by phone at [phone]     

## 2022-10-13 ENCOUNTER — Ambulatory Visit: Payer: Medicaid Other | Admitting: Family Medicine

## 2022-10-13 ENCOUNTER — Encounter: Payer: Self-pay | Admitting: Family Medicine

## 2022-10-13 ENCOUNTER — Telehealth: Payer: Self-pay

## 2022-10-13 VITALS — BP 134/80 | HR 83 | Ht 68.0 in | Wt 194.2 lb

## 2022-10-13 DIAGNOSIS — R519 Headache, unspecified: Secondary | ICD-10-CM

## 2022-10-13 DIAGNOSIS — I10 Essential (primary) hypertension: Secondary | ICD-10-CM | POA: Diagnosis present

## 2022-10-13 NOTE — Patient Instructions (Signed)
I sent in a letter for the today's office, let me know if they have any issue with it.

## 2022-10-13 NOTE — Progress Notes (Signed)
    SUBJECTIVE:   CHIEF COMPLAINT / HPI:   Headache  - Feels slight HA/pressure in the morning when taking his BP pill - Started once the hydrochlorothiazide was added for his BP control - Denies N/V, photophobia  Elevated Blood Pressure - Has been trying to get his tooth extraction done - Measurements have been 111/102 - After he left the dentist office and went to the urgent care to check it and it was around 142/80 - Notices it is high when they use the wrist blood pressure measurements  PERTINENT  PMH / PSH: Reviewed   OBJECTIVE:   BP 134/80   Pulse 83   Ht 5\' 8"  (1.727 m)   Wt 194 lb 3.2 oz (88.1 kg)   SpO2 99%   BMI 29.53 kg/m   General: NAD, well-appearing, well-nourished Respiratory: No respiratory distress, breathing comfortably, able to speak in full sentences Skin: warm and dry, no rashes noted on exposed skin Psych: Appropriate affect and mood Neuro: CN II-XII grossly intact with no focal deficit appreciated  ASSESSMENT/PLAN:   Hypertension Patient's blood pressure is largely well controlled on Amlodipine and Hyzaar. Measurements taken at his dentist office have been elevated as they check it with a wrist cuff and do not re-check it manually. I have reviewed all of the vitals available in the EMR as well as the locations and if the measurement was repeated. I feel that the measurements at the dentist office are not accurate and as they have delayed patient's dental care I have written a letter stating that his measurements should be checked manually, if this is not possible I have provided a letter stating that in my opinion, patient should be safe for his dental extraction procedure.  - Continue amlodipine 5mg  daily - Continue Losartan-hydrochlorothiazide 100-12.5mg  daily  Nonintractable headache Daily mild headache reported per patient with no red flags on exam or history. Seems most likely related to hydrochlorothiazide or tension headache given issues with  sleep. No adjustments to medications done today, but discussed with patient that he can consider taking his medications at a different time of day and see if that affects his symptoms. - Discussed return precautions if worsening - Discussed symptoms of migraines and to call if those develop     Evelena Leyden, DO [facility]

## 2022-10-13 NOTE — Assessment & Plan Note (Signed)
Patient's blood pressure is largely well controlled on Amlodipine and Hyzaar. Measurements taken at his dentist office have been elevated as they check it with a wrist cuff and do not re-check it manually. I have reviewed all of the vitals available in the EMR as well as the locations and if the measurement was repeated. I feel that the measurements at the dentist office are not accurate and as they have delayed patient's dental care I have written a letter stating that his measurements should be checked manually, if this is not possible I have provided a letter stating that in my opinion, patient should be safe for his dental extraction procedure.  - Continue amlodipine 5mg  daily - Continue Losartan-hydrochlorothiazide 100-12.5mg  daily

## 2022-10-13 NOTE — Telephone Encounter (Signed)
Patient's wife called into office stating patient needed clarification on the amounts of each topically medicine that needed to be used. I advised, per Dr. Onalee Hua, he should use a pea sized amount of the Tretinoin, a kidney bean size of the erythromycin gel and 1 pump of Cerve lotion. Patient voiced his understanding.

## 2022-10-13 NOTE — Assessment & Plan Note (Signed)
Daily mild headache reported per patient with no red flags on exam or history. Seems most likely related to hydrochlorothiazide or tension headache given issues with sleep. No adjustments to medications done today, but discussed with patient that he can consider taking his medications at a different time of day and see if that affects his symptoms. - Discussed return precautions if worsening - Discussed symptoms of migraines and to call if those develop

## 2022-10-20 ENCOUNTER — Ambulatory Visit: Payer: Medicaid Other | Admitting: Dermatology

## 2022-10-25 ENCOUNTER — Encounter: Payer: Self-pay | Admitting: Dermatology

## 2022-10-26 ENCOUNTER — Telehealth: Payer: Self-pay

## 2022-10-26 NOTE — Telephone Encounter (Signed)
Patient calls nurse line requesting PCP recommendation on Beet Root.   He reports he purchased Beet Root OTC, however he wants to make sure this is safe with all of his other medications.   He reports the bottle states to take (2) 1,000mg  per day.   Will forward to PCP.

## 2022-10-28 NOTE — Telephone Encounter (Signed)
PCP recommendation given to patient.

## 2022-10-29 ENCOUNTER — Emergency Department (HOSPITAL_COMMUNITY)
Admission: EM | Admit: 2022-10-29 | Discharge: 2022-10-29 | Disposition: A | Discharge status: Home / Self Care | Payer: Medicaid Other | Attending: Emergency Medicine | Admitting: Emergency Medicine

## 2022-10-29 ENCOUNTER — Emergency Department (HOSPITAL_COMMUNITY): Payer: Medicaid Other

## 2022-10-29 ENCOUNTER — Encounter (HOSPITAL_COMMUNITY): Payer: Self-pay

## 2022-10-29 ENCOUNTER — Other Ambulatory Visit: Payer: Self-pay

## 2022-10-29 DIAGNOSIS — R519 Headache, unspecified: Secondary | ICD-10-CM | POA: Insufficient documentation

## 2022-10-29 DIAGNOSIS — E119 Type 2 diabetes mellitus without complications: Secondary | ICD-10-CM | POA: Diagnosis not present

## 2022-10-29 DIAGNOSIS — R109 Unspecified abdominal pain: Secondary | ICD-10-CM | POA: Diagnosis present

## 2022-10-29 DIAGNOSIS — R0789 Other chest pain: Secondary | ICD-10-CM | POA: Insufficient documentation

## 2022-10-29 DIAGNOSIS — G8929 Other chronic pain: Secondary | ICD-10-CM

## 2022-10-29 LAB — BASIC METABOLIC PANEL
Anion gap: 7 (ref 5–15)
BUN: 13 mg/dL (ref 6–20)
CO2: 27 mmol/L (ref 22–32)
Calcium: 9.4 mg/dL (ref 8.9–10.3)
Chloride: 107 mmol/L (ref 98–111)
Creatinine, Ser: 0.77 mg/dL (ref 0.61–1.24)
GFR, Estimated: >60 mL/min (ref >60)
Glucose, Bld: 173 mg/dL — ABNORMAL HIGH (ref 70–99)
Potassium: 3.1 mmol/L — ABNORMAL LOW (ref 3.5–5.1)
Sodium: 141 mmol/L (ref 135–145)

## 2022-10-29 LAB — CBC
HCT: 52.6 % — ABNORMAL HIGH (ref 39.0–52.0)
Hemoglobin: 17.9 g/dL — ABNORMAL HIGH (ref 13.0–17.0)
MCH: 30.1 pg (ref 26.0–34.0)
MCHC: 34 g/dL (ref 30.0–36.0)
MCV: 88.6 fL (ref 80.0–100.0)
Platelets: 189 10*3/uL (ref 150–400)
RBC: 5.94 MIL/uL — ABNORMAL HIGH (ref 4.22–5.81)
RDW: 14.1 % (ref 11.5–15.5)
WBC: 9.6 10*3/uL (ref 4.0–10.5)
nRBC: 0 % (ref 0.0–0.2)

## 2022-10-29 LAB — TROPONIN I (HIGH SENSITIVITY): Troponin I (High Sensitivity): 9 ng/L (ref <18)

## 2022-10-29 NOTE — ED Triage Notes (Signed)
Patient has centralized chest pain since last night. No radiation, nausea or vomiting. Complaining of a headache that began 1 week ago.

## 2022-10-29 NOTE — Discharge Instructions (Signed)
You were seen in the emergency department for chest pain. Your labs, imaging, and EKG were all reassuring.  Advise a follow-up with her primary care provider for further evaluation.  I placed a referral to cardiology for further evaluation as well.  Regarding her headaches, advise talking with her primary care provider with the changes to your diabetic medication regimen as these may be causing her headaches.

## 2022-10-29 NOTE — ED Notes (Signed)
Pt provided discharge instructions and prescription information. Pt was given the opportunity to ask questions and questions were answered.   

## 2022-10-29 NOTE — ED Provider Notes (Signed)
[place] EMERGENCY DEPARTMENT AT [facility] Provider Note   CSN: [id number] Arrival date & time: 10/29/22  1515     History Chief Complaint  Patient presents with   Chest Pain    Eric Hooper is a 47 y.o. male.  Patient with past medical history significant for GERD, type 2 diabetes, and cervical disc disorder, presented to the emergency department complaints of chest pain.  Reports that chest pain began last night but is currently denying any radiation of this chest pain.  Denies any nausea, vomiting, diaphoresis.  No prior history of any cardiac abnormalities.  States that his pain has been present off and on for the last week or so with worsening over the last few days.  Noted yesterday that he was having more discomfort while he was at rest.  No prior history of any cardiac abnormalities such as CHF, A-fib, prior MI.  The history is provided by the patient. No language interpreter was used.  Chest Pain Pain location:  L chest and substernal area Pain quality: aching   Pain radiates to:  Does not radiate Pain severity:  Moderate Onset quality:  Gradual Duration:  1 week Timing:  Intermittent Progression:  Unchanged Chronicity:  Recurrent Relieved by:  Nothing Associated symptoms: no cough, no diaphoresis, no fever, no heartburn and no nausea   Risk factors: male sex   Risk factors: no aortic disease and not obese        Home Medications Prior to Admission medications   Medication Sig Start Date End Date Taking? Authorizing Provider  acetaminophen (TYLENOL) 325 MG tablet Take 2 tablets (650 mg total) by mouth every 4 (four) hours as needed (discomfort or fever). 11/29/21   Jerre Simon, MD  albuterol (VENTOLIN HFA) 108 (90 Base) MCG/ACT inhaler INHALE TWO PUFFS INTO THE LUNGS EVERY 6 HOURS AS NEEDED. 05/21/22   Lilland, Alana, DO  amLODipine (NORVASC) 5 MG tablet TAKE 1 TABLET BY MOUTH EVERYDAY AT BEDTIME 06/01/22   Lilland, Alana, DO  atorvastatin (LIPITOR) 40  MG tablet TAKE 1 TABLET BY MOUTH EVERY DAY 09/14/22   Lilland, Alana, DO  benzoyl peroxide 5 % gel Apply topically 2 (two) times daily. 02/23/22   Lilland, Alana, DO  blood glucose meter kit and supplies KIT Dispense based on patient and insurance preference. Use up to four times daily as directed. (FOR ICD-9 250.00, 250.01). 08/25/18   Garnette Gunner, MD  blood glucose meter kit and supplies Dispense based on patient and insurance preference. Use up to four times daily as directed. (FOR ICD-10 E10.9, E11.9). 09/02/18   Garnette Gunner, MD  cetirizine (ZYRTEC ALLERGY) 10 MG tablet Take 1 tablet (10 mg total) by mouth daily. Patient not taking: Reported on 10/13/2022 08/13/22   Carlisle Beers, FNP  diclofenac Sodium (VOLTAREN) 1 % GEL Apply 2 g topically 4 (four) times daily. 09/23/22   Garrison, [place] N, FNP  empagliflozin (JARDIANCE) 10 MG TABS tablet Take 1 tablet (10 mg total) by mouth daily. 06/09/22   Lilland, Alana, DO  EPINEPHrine 0.3 mg/0.3 mL IJ SOAJ injection Inject 0.3 mg into the muscle as needed for anaphylaxis. 08/13/22   Carlisle Beers, FNP  erythromycin with ethanol (EMGEL) 2 % gel Apply daily to affected areas 10/08/22   Terri Piedra, DO  famotidine (PEPCID) 20 MG tablet Take 1 tablet (20 mg total) by mouth daily. Patient not taking: Reported on 10/13/2022 08/13/22   Carlisle Beers, FNP  KLOR-CON M20 20 MEQ  tablet TAKE 1 TABLET BY MOUTH EVERY DAY 06/16/22   Ladene Artist, MD  losartan-hydrochlorothiazide (HYZAAR) 100-12.5 MG tablet Take 1 tablet by mouth daily. 09/29/22   Lilland, Percival Spanish, DO  Multiple Vitamin (MULTIVITAMIN) capsule Take 1 capsule by mouth daily.    [provider]  tretinoin (RETIN-A) 0.05 % cream Apply topically at bedtime. USE EVERY OTHER NIGHT FOR 1 MONTH, AFTER ONE MONTH INCREASE USE TO NIGHTLY 10/08/22 10/08/23  Terri Piedra, DO      Allergies    Ibuprofen    Review of Systems   Review of Systems  Constitutional:  Negative  for diaphoresis and fever.  Respiratory:  Negative for cough.   Cardiovascular:  Positive for chest pain.  Gastrointestinal:  Negative for heartburn and nausea.  All other systems reviewed and are negative.   Physical Exam Updated Vital Signs BP (!) 140/96   Pulse (!) 57   Temp 98.3 F (36.8 C) (Oral)   Resp (!) 21   Ht 5\' 10"  (1.778 m)   Wt 87.5 kg   SpO2 97%   BMI 27.69 kg/m  Physical Exam Vitals and nursing note reviewed.  Constitutional:      General: He is not in acute distress.    Appearance: He is well-developed.  HENT:     Head: Normocephalic and atraumatic.  Eyes:     Conjunctiva/sclera: Conjunctivae normal.  Cardiovascular:     Rate and Rhythm: Normal rate and regular rhythm.     Heart sounds: Normal heart sounds. No murmur heard.    No systolic murmur is present.     No diastolic murmur is present.  Pulmonary:     Effort: Pulmonary effort is normal. No respiratory distress.     Breath sounds: Normal breath sounds. No decreased breath sounds, wheezing, rhonchi or rales.  Chest:     Chest wall: No mass or tenderness.  Abdominal:     Palpations: Abdomen is soft.     Tenderness: There is no abdominal tenderness.  Musculoskeletal:        General: No swelling.     Cervical back: Neck supple.  Skin:    General: Skin is warm and dry.     Capillary Refill: Capillary refill takes less than 2 seconds.  Neurological:     Mental Status: He is alert.  Psychiatric:        Mood and Affect: Mood normal.     ED Results / Procedures / Treatments   Labs (all labs ordered are listed, but only abnormal results are displayed) Labs Reviewed  BASIC METABOLIC PANEL - Abnormal; Notable for the following components:      Result Value   Potassium 3.1 (*)    Glucose, Bld 173 (*)    All other components within normal limits  CBC - Abnormal; Notable for the following components:   RBC 5.94 (*)    Hemoglobin 17.9 (*)    HCT 52.6 (*)    All other components within normal  limits  TROPONIN I (HIGH SENSITIVITY)  TROPONIN I (HIGH SENSITIVITY)    EKG None  Radiology CT Head Wo Contrast  Result Date: 10/29/2022 CLINICAL DATA:  Headache, increasing frequency or severity EXAM: CT HEAD WITHOUT CONTRAST TECHNIQUE: Contiguous axial images were obtained from the base of the skull through the vertex without intravenous contrast. RADIATION DOSE REDUCTION: This exam was performed according to the departmental dose-optimization program which includes automated exposure control, adjustment of the mA and/or kV according to patient size and/or use  of iterative reconstruction technique. COMPARISON:  None Available. FINDINGS: Brain: No intracranial hemorrhage, mass effect, or evidence of acute infarct. No hydrocephalus. No extra-axial fluid collection. Vascular: No hyperdense vessel or unexpected calcification. Skull: No fracture or focal lesion. Sinuses/Orbits: No acute finding. Paranasal sinuses and mastoid air cells are well aerated. Other: None. IMPRESSION: No acute intracranial abnormality. Electronically Signed   By: Minerva Fester M.D.   On: 10/29/2022 17:53   DG Chest 2 View  Result Date: 10/29/2022 CLINICAL DATA:  Chest pain EXAM: CHEST - 2 VIEW COMPARISON:  Chest x-ray November 26, 2018 FINDINGS: The cardiomediastinal silhouette is unchanged in contour. No focal pulmonary opacity. No pleural effusion or pneumothorax. The visualized upper abdomen is unremarkable. No acute osseous abnormality. IMPRESSION: No active cardiopulmonary disease. Electronically Signed   By: Jacob Moores M.D.   On: 10/29/2022 16:01    Procedures Procedures   Medications Ordered in ED Medications - No data to display  ED Course/ Medical Decision Making/ A&P                           Medical Decision Making Amount and/or Complexity of Data Reviewed Labs: ordered. Radiology: ordered.   This patient presents to the ED for concern of chest pain and headache.  Differential diagnosis includes  esophageal spasm, GERD, MI, ACS, PE, stroke, migraine   Lab Tests:  I Ordered, and personally interpreted labs.  The pertinent results include: CBC unremarkable, BMP unremarkable, troponin negative   Imaging Studies ordered:  I ordered imaging studies including chest x-ray, CT head I independently visualized and interpreted imaging which showed no acute cardiopulmonary process I agree with the radiologist interpretation   Problem List / ED Course:  Patient presents to the emergency department complaints of chest pain.  Reports has been present for the last day or so but denies any radiation of this pain.  Denies any nausea or vomiting as well.  Also reports a somewhat chronic longstanding issue with headaches for several months now.  States was recently no changes medications metformin to [place].  Will evaluate with cardiac workup. CBC, BMP, troponin all unremarkable and negative.  Chest x-ray and EKG were all reassuring as well with no acute changes concerning for MI.  Will order CT head for further evaluation of headaches. Acute abnormality noted on CT head.  Patient's headaches do not appear to sound migrainous in presentation and I do not believe he would benefit from migraine cocktail at this time.  Instead advised patient to follow-up with primary care provider to discuss headaches.  Will refer patient to cardiology for further evaluation given that he feels that he is also having increasing palpitation burden.  Encourage patient return to the emergency department if he has any acute worsening of symptoms.  Patient is currently hemodynamically stable for discharge home.  All questions answered prior to patient discharge.  Final Clinical Impression(s) / ED Diagnoses Final diagnoses:  Other chest pain  Chronic nonintractable headache, unspecified headache type    Rx / DC Orders ED Discharge Orders          Ordered    Ambulatory referral to Cardiology       Comments: If you have  not heard from the Cardiology office within the next 72 hours please call [phone].   10/29/22 1832              Smitty Knudsen, PA-C 10/29/22 2023    Linwood Dibbles, MD 11/01/22  0708  

## 2022-11-01 ENCOUNTER — Emergency Department (HOSPITAL_COMMUNITY)
Admission: EM | Admit: 2022-11-01 | Discharge: 2022-11-01 | Disposition: A | Discharge status: Home / Self Care | Payer: Medicaid Other | Attending: Emergency Medicine | Admitting: Emergency Medicine

## 2022-11-01 ENCOUNTER — Other Ambulatory Visit: Payer: Self-pay

## 2022-11-01 DIAGNOSIS — R0789 Other chest pain: Secondary | ICD-10-CM | POA: Diagnosis not present

## 2022-11-01 DIAGNOSIS — E876 Hypokalemia: Secondary | ICD-10-CM

## 2022-11-01 DIAGNOSIS — R531 Weakness: Secondary | ICD-10-CM | POA: Insufficient documentation

## 2022-11-01 DIAGNOSIS — R42 Dizziness and giddiness: Secondary | ICD-10-CM | POA: Diagnosis present

## 2022-11-01 DIAGNOSIS — R519 Headache, unspecified: Secondary | ICD-10-CM | POA: Insufficient documentation

## 2022-11-01 LAB — BASIC METABOLIC PANEL
Anion gap: 10 (ref 5–15)
BUN: 11 mg/dL (ref 6–20)
CO2: 26 mmol/L (ref 22–32)
Calcium: 9.4 mg/dL (ref 8.9–10.3)
Chloride: 103 mmol/L (ref 98–111)
Creatinine, Ser: 0.81 mg/dL (ref 0.61–1.24)
GFR, Estimated: >60 mL/min (ref >60)
Glucose, Bld: 107 mg/dL — ABNORMAL HIGH (ref 70–99)
Potassium: 3.3 mmol/L — ABNORMAL LOW (ref 3.5–5.1)
Sodium: 139 mmol/L (ref 135–145)

## 2022-11-01 LAB — CBC WITH DIFFERENTIAL/PLATELET
Abs Immature Granulocytes: 0.03 10*3/uL (ref 0.00–0.07)
Basophils Absolute: 0 10*3/uL (ref 0.0–0.1)
Basophils Relative: 0 %
Eosinophils Absolute: 0.1 10*3/uL (ref 0.0–0.5)
Eosinophils Relative: 1 %
HCT: 51.8 % (ref 39.0–52.0)
Hemoglobin: 18.2 g/dL — ABNORMAL HIGH (ref 13.0–17.0)
Immature Granulocytes: 0 %
Lymphocytes Relative: 37 %
Lymphs Abs: 4.3 10*3/uL — ABNORMAL HIGH (ref 0.7–4.0)
MCH: 31.2 pg (ref 26.0–34.0)
MCHC: 35.1 g/dL (ref 30.0–36.0)
MCV: 88.9 fL (ref 80.0–100.0)
Monocytes Absolute: 0.9 10*3/uL (ref 0.1–1.0)
Monocytes Relative: 7 %
Neutro Abs: 6.4 10*3/uL (ref 1.7–7.7)
Neutrophils Relative %: 55 %
Platelets: 191 10*3/uL (ref 150–400)
RBC: 5.83 MIL/uL — ABNORMAL HIGH (ref 4.22–5.81)
RDW: 14.3 % (ref 11.5–15.5)
WBC: 11.8 10*3/uL — ABNORMAL HIGH (ref 4.0–10.5)
nRBC: 0 % (ref 0.0–0.2)

## 2022-11-01 LAB — CBG MONITORING, ED: Glucose-Capillary: 104 mg/dL — ABNORMAL HIGH (ref 70–99)

## 2022-11-01 MED ORDER — POTASSIUM CHLORIDE CRYS ER 20 MEQ PO TBCR
40.0000 meq | EXTENDED_RELEASE_TABLET | Freq: Once | ORAL | Status: AC
  Administered 2022-11-01: 40 meq via ORAL
  Filled 2022-11-01: qty 2

## 2022-11-01 MED ORDER — LORAZEPAM 0.5 MG PO TABS
0.5000 mg | ORAL_TABLET | Freq: Once | ORAL | Status: DC
  Filled 2022-11-01: qty 1

## 2022-11-01 MED ORDER — SODIUM CHLORIDE 0.9 % IV BOLUS
500.0000 mL | Freq: Once | INTRAVENOUS | Status: AC
  Administered 2022-11-01: 500 mL via INTRAVENOUS

## 2022-11-01 NOTE — Discharge Instructions (Signed)
Return for any problem.  ?

## 2022-11-01 NOTE — ED Triage Notes (Signed)
Pt c/o 2-3 weeks of intermittent dizziness, light headedness, describing near syncope upon standing up with weak legs and feeling on the verge of passing out. Has had headaches within same time frame. Recently seen in ED for similar s/s. Also c/o generalized aches across his body. Hx of diabetes on oral medication.

## 2022-11-01 NOTE — ED Provider Notes (Signed)
[place] EMERGENCY DEPARTMENT AT [facility] Provider Note   CSN: [id number] Arrival date & time: 11/01/22  1844     History  Chief Complaint  Patient presents with   Dizziness   Generalized Body Aches    Eric Hooper is a 47 y.o. male.  47 year old male with prior medical history as detailed below presents for evaluation.  Patient with chronic complaints including intermittent dizziness, intermittent headache, intermittent weakness.  Patient reports that he is feeling comfortable today.  Patient reports history of low platelets in the past.  Patient is requesting that platelet levels be checked today.  Patient admits to recent ED evaluation for similar complaints including atypical chest pain.  He reports that his workup at that time was without evidence of acute abnormality.  He denies fever.  He denies current chest pain or shortness of breath.  He denies nausea, vomiting, abdominal pain, change in bowel movement, urinary symptoms, etc.  The history is provided by the patient and medical records.       Home Medications Prior to Admission medications   Medication Sig Start Date End Date Taking? Authorizing Provider  acetaminophen (TYLENOL) 325 MG tablet Take 2 tablets (650 mg total) by mouth every 4 (four) hours as needed (discomfort or fever). 11/29/21   Jerre Simon, MD  albuterol (VENTOLIN HFA) 108 (90 Base) MCG/ACT inhaler INHALE TWO PUFFS INTO THE LUNGS EVERY 6 HOURS AS NEEDED. 05/21/22   Lilland, Alana, DO  amLODipine (NORVASC) 5 MG tablet TAKE 1 TABLET BY MOUTH EVERYDAY AT BEDTIME 06/01/22   Lilland, Alana, DO  atorvastatin (LIPITOR) 40 MG tablet TAKE 1 TABLET BY MOUTH EVERY DAY 09/14/22   Lilland, Alana, DO  benzoyl peroxide 5 % gel Apply topically 2 (two) times daily. 02/23/22   Lilland, Alana, DO  blood glucose meter kit and supplies KIT Dispense based on patient and insurance preference. Use up to four times daily as directed. (FOR ICD-9 250.00, 250.01).  08/25/18   Garnette Gunner, MD  blood glucose meter kit and supplies Dispense based on patient and insurance preference. Use up to four times daily as directed. (FOR ICD-10 E10.9, E11.9). 09/02/18   Garnette Gunner, MD  cetirizine (ZYRTEC ALLERGY) 10 MG tablet Take 1 tablet (10 mg total) by mouth daily. Patient not taking: Reported on 10/13/2022 08/13/22   Carlisle Beers, FNP  diclofenac Sodium (VOLTAREN) 1 % GEL Apply 2 g topically 4 (four) times daily. 09/23/22   Garrison, [place] N, FNP  empagliflozin (JARDIANCE) 10 MG TABS tablet Take 1 tablet (10 mg total) by mouth daily. 06/09/22   Lilland, Alana, DO  EPINEPHrine 0.3 mg/0.3 mL IJ SOAJ injection Inject 0.3 mg into the muscle as needed for anaphylaxis. 08/13/22   Carlisle Beers, FNP  erythromycin with ethanol (EMGEL) 2 % gel Apply daily to affected areas 10/08/22   Terri Piedra, DO  famotidine (PEPCID) 20 MG tablet Take 1 tablet (20 mg total) by mouth daily. Patient not taking: Reported on 10/13/2022 08/13/22   Carlisle Beers, FNP  KLOR-CON M20 20 MEQ tablet TAKE 1 TABLET BY MOUTH EVERY DAY 06/16/22   Ladene Artist, MD  losartan-hydrochlorothiazide (HYZAAR) 100-12.5 MG tablet Take 1 tablet by mouth daily. 09/29/22   Lilland, Percival Spanish, DO  Multiple Vitamin (MULTIVITAMIN) capsule Take 1 capsule by mouth daily.    [provider]  tretinoin (RETIN-A) 0.05 % cream Apply topically at bedtime. USE EVERY OTHER NIGHT FOR 1 MONTH, AFTER ONE MONTH INCREASE USE TO  NIGHTLY 10/08/22 10/08/23  Terri Piedra, DO      Allergies    Ibuprofen    Review of Systems   Review of Systems  All other systems reviewed and are negative.   Physical Exam Updated Vital Signs BP (!) 166/125 (BP Location: Right Arm)   Pulse 76   Temp 98.5 F (36.9 C) (Oral)   Resp 18   SpO2 99%  Physical Exam Vitals and nursing note reviewed.  Constitutional:      General: He is not in acute distress.    Appearance: Normal appearance. He is  well-developed.  HENT:     Head: Normocephalic and atraumatic.  Eyes:     Conjunctiva/sclera: Conjunctivae normal.     Pupils: Pupils are equal, round, and reactive to light.  Cardiovascular:     Rate and Rhythm: Normal rate and regular rhythm.     Heart sounds: Normal heart sounds.  Pulmonary:     Effort: Pulmonary effort is normal. No respiratory distress.     Breath sounds: Normal breath sounds.  Abdominal:     General: There is no distension.     Palpations: Abdomen is soft.     Tenderness: There is no abdominal tenderness.  Musculoskeletal:        General: No deformity. Normal range of motion.     Cervical back: Normal range of motion and neck supple.  Skin:    General: Skin is warm and dry.  Neurological:     General: No focal deficit present.     Mental Status: He is alert and oriented to person, place, and time. Mental status is at baseline.     Cranial Nerves: No cranial nerve deficit.     Sensory: No sensory deficit.     Motor: No weakness.     ED Results / Procedures / Treatments   Labs (all labs ordered are listed, but only abnormal results are displayed) Labs Reviewed  CBG MONITORING, ED - Abnormal; Notable for the following components:      Result Value   Glucose-Capillary 104 (*)    All other components within normal limits  CBC WITH DIFFERENTIAL/PLATELET  BASIC METABOLIC PANEL    EKG EKG Interpretation Date/Time:  Sunday November 01 2022 19:16:26 EDT Ventricular Rate:  74 PR Interval:  141 QRS Duration:  109 QT Interval:  425 QTC Calculation: 472 R Axis:   88  Text Interpretation: Sinus rhythm Ventricular premature complex Confirmed by Kristine Royal [phone]) on 11/01/2022 7:23:33 PM  Radiology No results found.  Procedures Procedures    Medications Ordered in ED Medications  sodium chloride 0.9 % bolus 500 mL (has no administration in time range)    ED Course/ Medical Decision Making/ A&P                             Medical Decision  Making Amount and/or Complexity of Data Reviewed Labs: ordered.  Risk Prescription drug management.    Medical Screen Complete  This patient presented to the ED with complaint of intermittent dizziness.  This complaint involves an extensive number of treatment options. The initial differential diagnosis includes, but is not limited to, metabolic abnormality, etc.  This presentation is: Acute, Chronic, Self-Limited, Previously Undiagnosed, Uncertain Prognosis, Complicated, Systemic Symptoms, and Threat to Life/Bodily Function  Patient is presenting with a list of chronic complaints.  Patient does not appear to have acute complaint.  Screening labs obtained are without significant abnormality.  Patient's potassium is mildly decreased.  Patient does take potassium supplementation at baseline.  Additional p.o. potassium given here in the ED.  Extensive time spent at the bedside discussing patient's current list of issues.  Patient reassured after discussion.  Both he and his fiance understand need for close outpatient follow-up.  Strict return precautions given and understood.   Additional history obtained: External records from outside sources obtained and reviewed including prior ED visits and prior Inpatient records.    Lab Tests:  I ordered and personally interpreted labs.    Cardiac Monitoring:  The patient was maintained on a cardiac monitor.  I personally viewed and interpreted the cardiac monitor which showed an underlying rhythm of: NSR   Medicines ordered:  I ordered medication including potassium for hypokalemia Reevaluation of the patient after these medicines showed that the patient: improved  Problem List / ED Course:  Hypokalemia   Reevaluation:  After the interventions noted above, I reevaluated the patient and found that they have: improved  Disposition:  After consideration of the diagnostic results and the patients response to treatment, I feel  that the patent would benefit from close outpatient follow-up.          Final Clinical Impression(s) / ED Diagnoses Final diagnoses:  Hypokalemia    Rx / DC Orders ED Discharge Orders     None         Wynetta Fines, MD 11/01/22 2052

## 2022-11-03 ENCOUNTER — Encounter: Payer: Self-pay | Admitting: Oncology

## 2022-11-03 ENCOUNTER — Ambulatory Visit: Payer: Medicaid Other | Attending: Internal Medicine | Admitting: Internal Medicine

## 2022-11-03 VITALS — BP 136/98 | HR 67 | Ht 70.0 in | Wt 192.4 lb

## 2022-11-03 DIAGNOSIS — R079 Chest pain, unspecified: Secondary | ICD-10-CM | POA: Diagnosis not present

## 2022-11-03 MED ORDER — METOPROLOL TARTRATE 50 MG PO TABS
50.0000 mg | ORAL_TABLET | Freq: Once | ORAL | 0 refills | Status: DC
Start: 2022-11-03 — End: 2022-12-30

## 2022-11-03 NOTE — Patient Instructions (Signed)
Medication Instructions:  Metoprolol Tartrate 50 mg 2 hours prior to CT *If you need a refill on your cardiac medications before your next appointment, please call your pharmacy*  Testing/Procedures: Your physician has requested that you have an echocardiogram. Echocardiography is a painless test that uses sound waves to create images of your heart. It provides your doctor with information about the size and shape of your heart and how well your heart's chambers and valves are working. This procedure takes approximately one hour. There are no restrictions for this procedure. Please do NOT wear cologne, perfume, aftershave, or lotions (deodorant is allowed). Please arrive 15 minutes prior to your appointment time.     Your cardiac CT will be scheduled at one of the below locations:   [facility] [address] [place], [place] [address] [phone]  If scheduled at [facility], please arrive at the [facility] and Children's Entrance (Entrance C2) of [facility] 30 minutes prior to test start time. You can use the FREE valet parking offered at entrance C (encouraged to control the heart rate for the test)  Proceed to the [facility] Radiology Department (first floor) to check-in and test prep.  All radiology patients and guests should use entrance C2 at [facility], accessed from [facility], even though the hospital's physical address listed is [address].     Please follow these instructions carefully (unless otherwise directed):  An IV will be required for this test and Nitroglycerin will be given.  Hold all erectile dysfunction medications at least 3 days (72 hrs) prior to test. (Ie viagra, cialis, sildenafil, tadalafil, etc)   On the Night Before the Test: Be sure to Drink plenty of water. Do not consume any caffeinated/decaffeinated beverages or chocolate 12 hours prior to your test. Do not take any antihistamines 12  hours prior to your test.   On the Day of the Test: Drink plenty of water until 1 hour prior to the test. Do not eat any food 1 hour prior to test. You may take your regular medications prior to the test.  Take metoprolol (Lopressor) two hours prior to test. If you take Furosemide/Hydrochlorothiazide/Spironolactone, please HOLD on the morning of the test.  After the Test: Drink plenty of water. After receiving IV contrast, you may experience a mild flushed feeling. This is normal. On occasion, you may experience a mild rash up to 24 hours after the test. This is not dangerous. If this occurs, you can take Benadryl 25 mg and increase your fluid intake. If you experience trouble breathing, this can be serious. If it is severe call 911 IMMEDIATELY. If it is mild, please call our office. If you take any of these medications: Glipizide/Metformin, Avandament, Glucavance, please do not take 48 hours after completing test unless otherwise instructed.  We will call to schedule your test 2-4 weeks out understanding that some insurance companies will need an authorization prior to the service being performed.   For more information and frequently asked questions, please visit our website : [email or web address]  For non-scheduling related questions, please contact the cardiac imaging nurse navigator should you have any questions/concerns: Rockwell [place], Cardiac Imaging Nurse Navigator Larey Brick, Cardiac Imaging Nurse Navigator [facility] Direct Office Dial: [phone]   For scheduling needs, including cancellations and rescheduling, please call [place], [phone].    Follow-Up: At [facility], you and your health needs are our priority.  As part of our  continuing mission to provide you with exceptional heart care, we have created designated Provider Care Teams.  These Care Teams include your primary Cardiologist (physician) and Advanced  Practice Providers (APPs -  Physician Assistants and Nurse Practitioners) who all work together to provide you with the care you need, when you need it.  We recommend signing up for the patient portal called "MyChart".  Sign up information is provided on this After Visit Summary.  MyChart is used to connect with patients for Virtual Visits (Telemedicine).  Patients are able to view lab/test results, encounter notes, upcoming appointments, etc.  Non-urgent messages can be sent to your provider as well.   To learn more about what you can do with MyChart, go to [email or web address].    Your next appointment:    Follow up as needed  Provider:   Dr Wyline Mood

## 2022-11-03 NOTE — Progress Notes (Signed)
Cardiology Office Note:    Date:  11/03/2022   ID:  Eric Hooper, DOB 25-Sep-1975, MRN [id number]  PCP:  Eric Shi, MD   [facility] Providers Cardiologist:  None     Referring MD: Eric Knudsen, PA-C   No chief complaint on file. CP  History of Present Illness:    Eric Hooper is a 47 y.o. male with a hx of htn, TTP, DM2 6.9, referral from the ED for CP.  Per their documentation " Reports that chest pain began last night but is currently denying any radiation of this chest pain. Denies any nausea, vomiting, diaphoresis. No prior history of any cardiac abnormalities. States that his pain has been present off and on for the last week or so with worsening over the last few days. Noted yesterday that he was having more discomfort while he was at rest. No prior history of any cardiac abnormalities such as CHF, A-fib, prior MI. " He states the last couple of weeks he get HA and chest pain. It is mainly mid sternal 10/10  non-radiating CP. He says he can be sitting on the couch in the evening. No consistent exercise. He smokes still. No SOB. No premature CAD.  He's had an echo in 07/2018 and this was unremarkable.   Past Medical History:  Diagnosis Date   At risk for dysfunction of heart 07/11/2018   Chest wall pain 08/14/2018   Controlled diabetes mellitus type 2 with complications (HCC) 02/25/2018   Diabetes mellitus without complication (HCC)    non-insulin dependent   Diverticulosis 02/01/2017   on CT scan abd/pelvis   GERD (gastroesophageal reflux disease)    Hypertension    Kidney stones 02/03/2017   Leg swelling 05/12/2017   Macrocytic anemia 02/01/2017   Microscopic hematuria 02/01/2017   Mixed hyperlipidemia 02/28/2018   Phlebitis 05/12/2017   Renal insufficiency 02/03/2017   Smoker 04/13/2017    Past Surgical History:  Procedure Laterality Date   ELBOW SURGERY Right    IR FLUORO GUIDE CV LINE RIGHT  02/02/2017   IR FLUORO GUIDE CV LINE RIGHT   11/23/2021   IR FLUORO GUIDE CV LINE RIGHT  11/28/2021   IR REMOVAL TUN CV CATH W/O FL  02/26/2022   IR US GUIDE VASC ACCESS RIGHT  02/02/2017   IR US GUIDE VASC ACCESS RIGHT  11/23/2021    Current Medications: No outpatient medications have been marked as taking for the 11/03/22 encounter (Office Visit) with Maisie Fus, MD.     Allergies:   Ibuprofen   Social History   Socioeconomic History   Marital status: Single    Spouse name: Not on file   Number of children: 2   Years of education: Not on file   Highest education level: Not on file  Occupational History   Occupation: Location manager  Tobacco Use   Smoking status: Every Day    Packs/day: 0.50    Years: 15.00    Additional pack years: 0.00    Total pack years: 7.50    Types: Cigarettes    Passive exposure: Current   Smokeless tobacco: Never  Vaping Use   Vaping Use: Never used  Substance and Sexual Activity   Alcohol use: Yes    Comment: 3-4 beers per day most days   Drug use: No   Sexual activity: Yes  Other Topics Concern   Not on file  Social History Narrative   ** Merged History Encounter **  Social Determinants of Health   Financial Resource Strain: Not on file  Food Insecurity: Not on file  Transportation Needs: Not on file  Physical Activity: Not on file  Stress: Not on file  Social Connections: Not on file     Family History: The patient's family history includes Colon cancer in his maternal aunt; Diabetes type II in his father; Hyperlipidemia in his mother; Hypertension in his mother.  ROS:   Please see the history of present illness.     All other systems reviewed and are negative.  EKGs/Labs/Other Studies Reviewed:    The following studies were reviewed today: EKG Interpretation Date/Time:  Tuesday November 03 2022 14:30:41 EDT Ventricular Rate:  62 PR Interval:  140 QRS Duration:  110 QT Interval:  420 QTC Calculation: 426 R Axis:   97  Text Interpretation: Sinus rhythm with  frequent Premature ventricular complexes Rightward axis T wave abnormality, consider inferior ischemia When compared with ECG of 01-Nov-2022 19:16, PREVIOUS ECG IS PRESENT Confirmed by Carolan Clines ([id number]) on 11/03/2022 2:35:44 PM       Recent Labs: 04/16/2022: Magnesium 1.7 09/18/2022: ALT 33 11/01/2022: BUN 11; Creatinine, Ser 0.81; Hemoglobin 18.2; Platelets 191; Potassium 3.3; Sodium 139   Recent Lipid Panel    Component Value Date/Time   CHOL 94 (L) 05/29/2022 1227   TRIG 53 05/29/2022 1227   HDL 42 05/29/2022 1227   CHOLHDL 2.2 05/29/2022 1227   LDLCALC 39 05/29/2022 1227     Risk Assessment/Calculations:    Physical Exam:    VS:   Vitals:   11/03/22 1424  BP: (!) 136/98  Pulse: 67  SpO2: 95%     Wt Readings from Last 3 Encounters:  10/29/22 193 lb (87.5 kg)  10/13/22 194 lb 3.2 oz (88.1 kg)  09/18/22 198 lb 6.4 oz (90 kg)     GEN:  Well nourished, well developed in no acute distress HEENT: Normal NECK: No JVD CARDIAC: RRR, +ectopy, no murmurs, rubs, gallops RESPIRATORY:  Clear to auscultation without rales, wheezing or rhonchi  ABDOMEN: Soft, non-tender, non-distended MUSCULOSKELETAL:  No edema; No deformity  SKIN: Warm and dry NEUROLOGIC:  Alert and oriented x 3 PSYCHIATRIC:  Normal affect   ASSESSMENT:    1. Chest pain of uncertain etiology   - p/w persistent midsternal chest pain, non RVOT PVCs, and risk includes DM2. Will risk stratify PLAN:    In order of problems listed above:  Coronary CTA with morphology TTE Follow up pending results         Medication Adjustments/Labs and Tests Ordered: Current medicines are reviewed at length with the patient today.  Concerns regarding medicines are outlined above.  No orders of the defined types were placed in this encounter.  No orders of the defined types were placed in this encounter.   There are no Patient Instructions on file for this visit.   Signed, Maisie Fus, MD  11/03/2022 1:58 PM     [facility]

## 2022-11-06 ENCOUNTER — Encounter: Payer: Self-pay | Admitting: Family Medicine

## 2022-11-06 ENCOUNTER — Ambulatory Visit: Payer: Medicaid Other | Admitting: Family Medicine

## 2022-11-06 ENCOUNTER — Other Ambulatory Visit: Payer: Self-pay

## 2022-11-06 VITALS — BP 136/88 | HR 62 | Ht 70.0 in | Wt 191.8 lb

## 2022-11-06 DIAGNOSIS — E119 Type 2 diabetes mellitus without complications: Secondary | ICD-10-CM | POA: Diagnosis not present

## 2022-11-06 DIAGNOSIS — I10 Essential (primary) hypertension: Secondary | ICD-10-CM | POA: Diagnosis not present

## 2022-11-06 DIAGNOSIS — R519 Headache, unspecified: Secondary | ICD-10-CM

## 2022-11-06 LAB — POCT GLYCOSYLATED HEMOGLOBIN (HGB A1C): HbA1c, POC (controlled diabetic range): 7.2 % — AB (ref 0.0–7.0)

## 2022-11-06 MED ORDER — AMLODIPINE BESYLATE 10 MG PO TABS
10.0000 mg | ORAL_TABLET | Freq: Every day | ORAL | 3 refills | Status: DC
Start: 2022-11-06 — End: 2022-11-09

## 2022-11-06 MED ORDER — LOSARTAN POTASSIUM 100 MG PO TABS
100.0000 mg | ORAL_TABLET | Freq: Every day | ORAL | 3 refills | Status: DC
Start: 2022-11-06 — End: 2022-11-09

## 2022-11-06 NOTE — Assessment & Plan Note (Signed)
Worsening control, limited time to discuss today but patient plans to increase exercise by walking >30 minutes daily. - Continue Jardiance - f/u in 1 month

## 2022-11-06 NOTE — Progress Notes (Addendum)
SUBJECTIVE:   CHIEF COMPLAINT / HPI:  Eric Hooper is a 47 y.o. male with a pertinent past medical history of HTN, T2DM, GERD, HLD. And tobacco use, presenting to the clinic for headache and dizziness.  Headache & dizziness Has been Not symptomatic now, last symptoms were this past weekend, maybe a little yesterday. Has never had headaches before, started in November 2023 after getting his platelets drawn for TTP. Unilateral onset, frontal or parietal region. Sensitive to light during the headaches.  No nausea and vomiting.  No tinnitus. Eyes get blurry, thinks he sometimes sees something moving in his peripheral vision. Got some associated dizziness and chest pain. Dizziness got severe to the point he could barely stand one time. Presented to ED on 7/4 for chest pain (negative workup) and 7/7 for dizziness (diagnosed with hypokalemia).  Recently started losartan-hydrochlorothiazide on 6/4.  Mother was on same med, was taken off due to hypokalemia. Feels good when he awakens, gets symptoms after taking medicines.  CT was normal on 7/4. Has diabetes, checking CBGs at home and has seen readings in 60s, have been trending down recently.  Eating food normally.  Diabetes - Last A1c 6.9 in Feb 2024 - Home CBGs: 150s - Medications: Jardiance - Adherence: Good - Eye exam: Scheduled - Foot exam: Due, not obtained today - Microalbumin: Not due - Statin: Atorvastatin - No symptoms of hypoglycemia, polyuria, polydipsia, numbness extremities, foot ulcers/trauma   PERTINENT PMH / PSH: HTN, T2DM, GERD, HLD   OBJECTIVE:   BP 136/88   Pulse 62   Ht 5\' 10"  (1.778 m)   Wt 191 lb 12.8 oz (87 kg)   SpO2 99%   BMI 27.52 kg/m   General: Age-appropriate, resting comfortably in chair, NAD, alert and at baseline. Cardiovascular: Regular rate and rhythm. Normal S1/S2. No murmurs, rubs, or gallops appreciated. 2+ radial pulses. Pulmonary: Clear bilaterally to ascultation. No increased WOB, no  accessory muscle usage. No wheezes, crackles, or rhonchi.  Neurological Examination: MS: Awake, alert, interactive. Normal eye contact, answered the questions appropriately, speech was fluent, normal comprehension.  Attention and concentration were normal. Cranial Nerves: CNs II-XII intact Tone: Normal Strength: Normal strength in all muscle groups bilaterally Sensation: Intact to light touch, Romberg negative. Coordination: No difficulty with balance. Gait: Normal walk.  POCT Labs HgB A1c     Status: Abnormal   Collection Time: 11/06/22 10:45 AM  Result Value Ref Range   HbA1c, POC (controlled diabetic range) 7.2 (A) 0.0 - 7.0 %    ASSESSMENT/PLAN:   Problem List Items Addressed This Visit       Cardiovascular and Mediastinum   Hypertension    Patient's BP intermittently controlled, but wants to stop hydrochlorothiazide due to recent hypokalemia in ED.  BP: 136/88. Goal of 130/80. Patient's medication regimen as below. - Increased amlodipine to 10 mg daily - Stopped losartan/hydrochlorothiazide combo, replaced with same dose losartan 100 mg nightly - Patient to take potassium supplement through the end of July and stop August 1st - Referral to pharmacy clinic due to hypokalemia, polypharmacy - Requested patient start BP log - f/u in 1 month in clinic      Relevant Medications   losartan (COZAAR) 100 MG tablet   amLODipine (NORVASC) 10 MG tablet     Endocrine   Type 2 diabetes mellitus without complications (HCC) - Primary    Worsening control, limited time to discuss today but patient plans to increase exercise by walking >30 minutes daily. - Continue Jardiance -  f/u in 1 month      Relevant Medications   losartan (COZAAR) 100 MG tablet   Other Relevant Orders   HgB A1c (Completed)     Other   Headache    Frequent headache and lightheadedness.  Suspect migraine, but differential includes cervicogenic and tension type headaches as well.  No red flags for  intracranial processes and CT head normal on 10/29/22.  Normal neurological exam further reassuring.  Patient unable to take ibuprofen due to TTP and cardiac stress test pending for possible CAD, which precludes triptan use for now. - Discussed ED precautions in event of thunderclap headache - Recommended acetaminophen bolus (1000 to 1500 mg dose) when headache starts - Plan to start triptan if patient's cardiology workup is negative for CAD      Relevant Medications   amLODipine (NORVASC) 10 MG tablet   Other Visit Diagnoses     Essential hypertension       Relevant Medications   losartan (COZAAR) 100 MG tablet   amLODipine (NORVASC) 10 MG tablet      Return in about 1 month (around 12/07/2022) for BP and diabetes f/u, ideally w/ Shakerra Red.  Ily Denno Sharion Dove, MD [facility]

## 2022-11-06 NOTE — Assessment & Plan Note (Signed)
Frequent headache and lightheadedness.  Suspect migraine, but differential includes cervicogenic and tension type headaches as well.  No red flags for intracranial processes and CT head normal on 10/29/22.  Normal neurological exam further reassuring.  Patient unable to take ibuprofen due to TTP and cardiac stress test pending for possible CAD, which precludes triptan use for now. - Discussed ED precautions in event of thunderclap headache - Recommended acetaminophen bolus (1000 to 1500 mg dose) when headache starts - Plan to start triptan if patient's cardiology workup is negative for CAD

## 2022-11-06 NOTE — Patient Instructions (Addendum)
It was great to see you today! Thank you for choosing [facility] for your primary care.  Today we addressed: - Headaches: Take 2-3 pills of Tylenol right away.  If your cardiology tests are normal, we will prescribe a more effective medicine. - Blood pressure: Stopping hydrochlorothiazide, increasing amlodipine, keeping losartan the same. - Keep your cardiology appointments. - If you get a headache that is incredibly strong and sudden or if you get more chest pain that is worse go to the ED.  You should return to our clinic in 1 month for diabetes.  Thank you for coming to see [place] at [facility] and for the opportunity to care for you! Lateesha Bezold, MD 11/06/2022, 11:48 AM  ____________________________________________________  Make sure to check out at the front desk before you leave today.  Please arrive at least 15 minutes prior to your scheduled appointments.  If you had a referral placed, they will call you to set up an appointment. Please give [place] a call if you don't hear back in the next 2 weeks.  If you need additional refills before your next appointment, please call your pharmacy first.

## 2022-11-06 NOTE — Assessment & Plan Note (Addendum)
Patient's BP intermittently controlled, but wants to stop hydrochlorothiazide due to recent hypokalemia in ED.  BP: 136/88. Goal of 130/80. Patient's medication regimen as below. - Increased amlodipine to 10 mg daily - Stopped losartan/hydrochlorothiazide combo, replaced with same dose losartan 100 mg nightly - Patient to take potassium supplement through the end of July and stop August 1st - Referral to pharmacy clinic due to hypokalemia, polypharmacy - Requested patient start BP log - f/u in 1 month in clinic

## 2022-11-09 ENCOUNTER — Telehealth: Payer: Self-pay

## 2022-11-09 DIAGNOSIS — I10 Essential (primary) hypertension: Secondary | ICD-10-CM

## 2022-11-09 MED ORDER — LOSARTAN POTASSIUM 100 MG PO TABS
100.0000 mg | ORAL_TABLET | Freq: Every day | ORAL | 3 refills | Status: DC
Start: 2022-11-09 — End: 2023-11-18

## 2022-11-09 MED ORDER — AMLODIPINE BESYLATE 10 MG PO TABS
10.0000 mg | ORAL_TABLET | Freq: Every day | ORAL | 3 refills | Status: DC
Start: 2022-11-09 — End: 2023-11-01

## 2022-11-09 NOTE — Telephone Encounter (Signed)
Patient is concerned about taking Magnesium Citrate and it dropping his blood pressure.  Spoke to Dr. Sharion Dove and he says it's okay for patient to take.  Called patient and informed him that he can take. For his constipation. Doctor Sharion Dove advises that he take it slow.  Patient wants to take it all at once.   Glennie Hawk, CMA

## 2022-11-09 NOTE — Telephone Encounter (Signed)
Patient calls nurse line regards blood pressure medications.   He reports he was seen in office on 7/12 and Amlodipine and Losartan were sent in. However, Sharion Dove is not enrolled with Medicaid.   Will resend in under morning preceptor.

## 2022-11-09 NOTE — Telephone Encounter (Signed)
Patient calls nurse line requesting to speak with PCP.   Patient reports he has been constipated for several weeks now on/off. He reports his last BM was 3 days ago.   He denies any bloody stools, abdominal pain, nausea or vomiting.   He reports he bought magnesium citrus OTC.   He would like to know if this is appropriate.   Will forward to PCP.

## 2022-11-11 ENCOUNTER — Telehealth: Payer: Self-pay

## 2022-11-11 NOTE — Telephone Encounter (Signed)
Patient calls nurse line in regards to blood pressure medications.   He reports he spoke with someone last week about prescriber (shitarev) not being enrolled in [place].   Prescriptions were resent under [place] on 7/15. He reports he was there yesterday and "nothing was ready."  Advised patient of this and I would call the pharmacy to see what the issue is.   Patient stated, "yall dont ever do what yall suppose to do, I am sick of yall and I am going to report yall."   Call was disconnected by me.   Of note, I called the pharmacy and the prescriptions are ready for pick up.

## 2022-11-18 ENCOUNTER — Encounter (HOSPITAL_COMMUNITY): Payer: Self-pay

## 2022-11-18 ENCOUNTER — Telehealth (HOSPITAL_COMMUNITY): Payer: Self-pay | Admitting: Emergency Medicine

## 2022-11-18 NOTE — Telephone Encounter (Signed)
Reaching out to patient to offer assistance regarding upcoming cardiac imaging study; pt verbalizes understanding of appt date/time, parking situation and where to check in, pre-test NPO status and medications ordered, and verified current allergies; name and call back number provided for further questions should they arise Sara Wallace RN Navigator Cardiac Imaging [facility] [phone] office [phone] cell 

## 2022-11-19 ENCOUNTER — Ambulatory Visit (HOSPITAL_COMMUNITY)
Admission: RE | Admit: 2022-11-19 | Discharge: 2022-11-19 | Disposition: A | Discharge status: Home / Self Care | Payer: Medicaid Other | Source: Ambulatory Visit | Attending: Internal Medicine | Admitting: Internal Medicine

## 2022-11-19 DIAGNOSIS — R072 Precordial pain: Secondary | ICD-10-CM

## 2022-11-19 DIAGNOSIS — R079 Chest pain, unspecified: Secondary | ICD-10-CM | POA: Diagnosis present

## 2022-11-19 MED ORDER — IOHEXOL 350 MG/ML SOLN
100.0000 mL | Freq: Once | INTRAVENOUS | Status: AC | PRN
  Administered 2022-11-19: 100 mL via INTRAVENOUS

## 2022-11-19 MED ORDER — NITROGLYCERIN 0.4 MG SL SUBL
0.8000 mg | SUBLINGUAL_TABLET | SUBLINGUAL | Status: DC | PRN
  Administered 2022-11-19: 0.8 mg via SUBLINGUAL

## 2022-11-19 MED ORDER — NITROGLYCERIN 0.4 MG SL SUBL
SUBLINGUAL_TABLET | SUBLINGUAL | Status: AC
  Filled 2022-11-19: qty 2

## 2022-11-19 NOTE — Progress Notes (Signed)
Patient tolerated without distress 

## 2022-11-26 ENCOUNTER — Other Ambulatory Visit: Payer: Self-pay

## 2022-11-26 ENCOUNTER — Ambulatory Visit (HOSPITAL_COMMUNITY): Payer: Medicaid Other | Attending: Internal Medicine

## 2022-11-26 DIAGNOSIS — R079 Chest pain, unspecified: Secondary | ICD-10-CM | POA: Insufficient documentation

## 2022-11-26 LAB — ECHOCARDIOGRAM COMPLETE
Area-P 1/2: 3.23 cm2
S' Lateral: 3.8 cm

## 2022-11-26 MED ORDER — ASPIRIN 81 MG PO TBEC: 81.0000 mg | DELAYED_RELEASE_TABLET | Freq: Every day | ORAL | Status: AC

## 2022-12-02 ENCOUNTER — Telehealth: Payer: Self-pay | Admitting: *Deleted

## 2022-12-02 NOTE — Telephone Encounter (Signed)
Left message that cardiologist wants him to begin aspirin daily. Asking if that is safe w/his diagnosis of TTP?

## 2022-12-04 ENCOUNTER — Other Ambulatory Visit: Payer: Self-pay | Admitting: Internal Medicine

## 2022-12-10 ENCOUNTER — Ambulatory Visit: Payer: Self-pay | Admitting: Family Medicine

## 2022-12-17 ENCOUNTER — Encounter: Payer: Self-pay | Admitting: Oncology

## 2022-12-17 ENCOUNTER — Encounter (HOSPITAL_COMMUNITY): Payer: Self-pay | Admitting: Emergency Medicine

## 2022-12-17 ENCOUNTER — Other Ambulatory Visit: Payer: Self-pay

## 2022-12-17 ENCOUNTER — Emergency Department (HOSPITAL_COMMUNITY): Payer: Medicaid Other

## 2022-12-17 ENCOUNTER — Emergency Department (HOSPITAL_COMMUNITY)
Admission: EM | Admit: 2022-12-17 | Discharge: 2022-12-18 | Disposition: A | Discharge status: Home / Self Care | Payer: Medicaid Other | Attending: Emergency Medicine | Admitting: Emergency Medicine

## 2022-12-17 DIAGNOSIS — Z79899 Other long term (current) drug therapy: Secondary | ICD-10-CM | POA: Insufficient documentation

## 2022-12-17 DIAGNOSIS — R079 Chest pain, unspecified: Secondary | ICD-10-CM | POA: Diagnosis not present

## 2022-12-17 DIAGNOSIS — Z7984 Long term (current) use of oral hypoglycemic drugs: Secondary | ICD-10-CM | POA: Insufficient documentation

## 2022-12-17 DIAGNOSIS — E119 Type 2 diabetes mellitus without complications: Secondary | ICD-10-CM | POA: Insufficient documentation

## 2022-12-17 DIAGNOSIS — I10 Essential (primary) hypertension: Secondary | ICD-10-CM | POA: Diagnosis not present

## 2022-12-17 DIAGNOSIS — R519 Headache, unspecified: Secondary | ICD-10-CM | POA: Diagnosis not present

## 2022-12-17 DIAGNOSIS — Z7982 Long term (current) use of aspirin: Secondary | ICD-10-CM | POA: Diagnosis not present

## 2022-12-17 DIAGNOSIS — I700 Atherosclerosis of aorta: Secondary | ICD-10-CM | POA: Diagnosis not present

## 2022-12-17 LAB — CBC
HCT: 48.9 % (ref 39.0–52.0)
Hemoglobin: 17 g/dL (ref 13.0–17.0)
MCH: 30.5 pg (ref 26.0–34.0)
MCHC: 34.8 g/dL (ref 30.0–36.0)
MCV: 87.6 fL (ref 80.0–100.0)
Platelets: 196 10*3/uL (ref 150–400)
RBC: 5.58 MIL/uL (ref 4.22–5.81)
RDW: 13.8 % (ref 11.5–15.5)
WBC: 10.1 10*3/uL (ref 4.0–10.5)
nRBC: 0 % (ref 0.0–0.2)

## 2022-12-17 LAB — BASIC METABOLIC PANEL
Anion gap: 14 (ref 5–15)
BUN: 11 mg/dL (ref 6–20)
CO2: 23 mmol/L (ref 22–32)
Calcium: 9.4 mg/dL (ref 8.9–10.3)
Chloride: 103 mmol/L (ref 98–111)
Creatinine, Ser: 1.02 mg/dL (ref 0.61–1.24)
GFR, Estimated: >60 mL/min (ref >60)
Glucose, Bld: 114 mg/dL — ABNORMAL HIGH (ref 70–99)
Potassium: 3.5 mmol/L (ref 3.5–5.1)
Sodium: 140 mmol/L (ref 135–145)

## 2022-12-17 LAB — TROPONIN I (HIGH SENSITIVITY): Troponin I (High Sensitivity): 8 ng/L (ref <18)

## 2022-12-17 NOTE — ED Triage Notes (Signed)
Pt with CP and shob x "months"  seen for [facility] a couple of months ago for same and then followed up with his doctor who adjusted his BP meds.  Pt reports he continues to have these feelings intermittently and describes it as a "cloudiness" in his chest.

## 2022-12-18 ENCOUNTER — Emergency Department (HOSPITAL_COMMUNITY): Payer: Medicaid Other

## 2022-12-18 LAB — TROPONIN I (HIGH SENSITIVITY): Troponin I (High Sensitivity): 9 ng/L (ref <18)

## 2022-12-18 NOTE — Discharge Instructions (Signed)
You were evaluated today for chest pain and a headache.  Your workup was reassuring but gave no cause for your current symptoms.  I recommend continued follow-up with your primary care team for further evaluation and management of your symptoms as needed.

## 2022-12-18 NOTE — ED Provider Notes (Signed)
[place] EMERGENCY DEPARTMENT AT [facility] Provider Note   CSN: [id number] Arrival date & time: 12/17/22  2116     History  Chief Complaint  Patient presents with   Chest Pain    Eric Hooper is a 47 y.o. male.  Patient presents to the emergency department complaining of vague chest pain described as "cloudiness" and a headache described as "cloudiness".  He states the headache is the worst he is ever had as he does not normally get headaches.  He then states that he had a similar presentation just over a month ago and was evaluated at the emergency department with no acute findings.  Overall his chest pain has been ongoing for months.  He had a full cardiac workup including visits by cardiology with a cardiac CT in late July.  He is followed by cardiology currently.  His headache is described as mild, although also described as the worst ever, and on the right side of his head and described as a cloudy feeling.  He denies nausea, vomiting, abdominal pain.  He does endorse a very mild shortness of breath feeling that accompanies the chest cloudiness.  He has concerns about his blood pressure as it has been fluctuating while in the emergency department.  He states he was recently placed on increased dose of amlodipine, 10 mg daily, and a nightly dose of losartan.  Past medical history significant for type II DM, TTP syndrome, GERD, renal insufficiency, headaches, chest wall pain.  Patient is a tobacco user  HPI     Home Medications Prior to Admission medications   Medication Sig Start Date End Date Taking? Authorizing Provider  acetaminophen (TYLENOL) 325 MG tablet Take 2 tablets (650 mg total) by mouth every 4 (four) hours as needed (discomfort or fever). 11/29/21   Jerre Simon, MD  albuterol (VENTOLIN HFA) 108 (90 Base) MCG/ACT inhaler INHALE TWO PUFFS INTO THE LUNGS EVERY 6 HOURS AS NEEDED. Patient not taking: Reported on 11/06/2022 05/21/22   Lilland, Alana, DO  amLODipine  (NORVASC) 10 MG tablet Take 1 tablet (10 mg total) by mouth at bedtime. 11/09/22   Caro Laroche, DO  aspirin EC 81 MG tablet Take 1 tablet (81 mg total) by mouth daily. Swallow whole. 11/26/22   Maisie Fus, MD  atorvastatin (LIPITOR) 40 MG tablet TAKE 1 TABLET BY MOUTH EVERY DAY 09/14/22   Lilland, Alana, DO  benzoyl peroxide 5 % gel Apply topically 2 (two) times daily. 02/23/22   Lilland, Alana, DO  blood glucose meter kit and supplies KIT Dispense based on patient and insurance preference. Use up to four times daily as directed. (FOR ICD-9 250.00, 250.01). 08/25/18   Garnette Gunner, MD  blood glucose meter kit and supplies Dispense based on patient and insurance preference. Use up to four times daily as directed. (FOR ICD-10 E10.9, E11.9). 09/02/18   Garnette Gunner, MD  cetirizine (ZYRTEC ALLERGY) 10 MG tablet Take 1 tablet (10 mg total) by mouth daily. Patient not taking: Reported on 11/06/2022 08/13/22   Carlisle Beers, FNP  diclofenac Sodium (VOLTAREN) 1 % GEL Apply 2 g topically 4 (four) times daily. 09/23/22   Garrison, [place] N, FNP  empagliflozin (JARDIANCE) 10 MG TABS tablet Take 1 tablet (10 mg total) by mouth daily. 06/09/22   Lilland, Alana, DO  EPINEPHrine 0.3 mg/0.3 mL IJ SOAJ injection Inject 0.3 mg into the muscle as needed for anaphylaxis. 08/13/22   Carlisle Beers, FNP  erythromycin with ethanol (  EMGEL) 2 % gel Apply daily to affected areas 10/08/22   Terri Piedra, DO  losartan (COZAAR) 100 MG tablet Take 1 tablet (100 mg total) by mouth at bedtime. 11/09/22   Caro Laroche, DO  metoprolol tartrate (LOPRESSOR) 50 MG tablet Take 1 tablet (50 mg total) by mouth once for 1 dose. PLEASE TAKE METOPROLOL 2  HOURS PRIOR TO CTA SCAN. 11/03/22 11/03/22  Maisie Fus, MD  Multiple Vitamin (MULTIVITAMIN) capsule Take 1 capsule by mouth daily.    [provider]  tretinoin (RETIN-A) 0.05 % cream Apply topically at bedtime. USE EVERY OTHER NIGHT FOR 1 MONTH, AFTER  ONE MONTH INCREASE USE TO NIGHTLY 10/08/22 10/08/23  Terri Piedra, DO      Allergies    Ibuprofen    Review of Systems   Review of Systems  Physical Exam Updated Vital Signs BP (!) 137/95   Pulse 68   Temp 98.2 F (36.8 C)   Resp 18   SpO2 100%  Physical Exam Vitals and nursing note reviewed.  Constitutional:      General: He is not in acute distress.    Appearance: He is well-developed.  HENT:     Head: Normocephalic and atraumatic.  Eyes:     Conjunctiva/sclera: Conjunctivae normal.  Cardiovascular:     Rate and Rhythm: Normal rate and regular rhythm.     Heart sounds: No murmur heard. Pulmonary:     Effort: Pulmonary effort is normal. No respiratory distress.     Breath sounds: Normal breath sounds.  Abdominal:     Palpations: Abdomen is soft.     Tenderness: There is no abdominal tenderness.  Musculoskeletal:        General: No swelling.     Cervical back: Neck supple.  Skin:    General: Skin is warm and dry.     Capillary Refill: Capillary refill takes less than 2 seconds.  Neurological:     General: No focal deficit present.     Mental Status: He is alert and oriented to person, place, and time.     Cranial Nerves: No cranial nerve deficit.  Psychiatric:        Mood and Affect: Mood normal.     ED Results / Procedures / Treatments   Labs (all labs ordered are listed, but only abnormal results are displayed) Labs Reviewed  BASIC METABOLIC PANEL - Abnormal; Notable for the following components:      Result Value   Glucose, Bld 114 (*)    All other components within normal limits  CBC  TROPONIN I (HIGH SENSITIVITY)  TROPONIN I (HIGH SENSITIVITY)    EKG EKG Interpretation Date/Time:  Thursday December 17 2022 21:04:04 EDT Ventricular Rate:  85 PR Interval:  144 QRS Duration:  102 QT Interval:  390 QTC Calculation: 464 R Axis:   95  Text Interpretation: Sinus rhythm with frequent Premature ventricular complexes Rightward axis Abnormal QRS-T  angle, consider primary T wave abnormality Abnormal ECG Confirmed by Tilden Fossa [phone]) on 12/18/2022 3:41:13 AM  Radiology CT Head Wo Contrast  Result Date: 12/18/2022 CLINICAL DATA:  Headache, increasing frequency or severity EXAM: CT HEAD WITHOUT CONTRAST TECHNIQUE: Contiguous axial images were obtained from the base of the skull through the vertex without intravenous contrast. RADIATION DOSE REDUCTION: This exam was performed according to the departmental dose-optimization program which includes automated exposure control, adjustment of the mA and/or kV according to patient size and/or use of iterative reconstruction technique. COMPARISON:  10/29/2022  FINDINGS: Brain: No evidence of acute infarction, hemorrhage, mass, mass effect, or midline shift. No hydrocephalus or extra-axial fluid collection. Partial empty sella. Normal craniocervical junction. Vascular: No hyperdense vessel. Skull: Negative for fracture or focal lesion. Sinuses/Orbits: Mucosal thickening in the inferior maxillary sinuses. Mild mucosal thickening in the ethmoid air cells. Hypoplastic right frontal sinus. No acute finding in the orbits. Other: The mastoid air cells are well aerated. IMPRESSION: No acute intracranial process. No etiology is seen for the patient's headaches. Electronically Signed   By: Wiliam Ke M.D.   On: 12/18/2022 03:41   DG Chest 2 View  Result Date: 12/17/2022 CLINICAL DATA:  Chest pain EXAM: CHEST - 2 VIEW COMPARISON:  Radiographs 10/29/2022 FINDINGS: Stable cardiomediastinal silhouette. Aortic atherosclerotic calcification. No focal consolidation, pleural effusion, or pneumothorax. No displaced rib fractures. IMPRESSION: No acute cardiopulmonary disease. Electronically Signed   By: Minerva Fester M.D.   On: 12/17/2022 22:18    Procedures Procedures    Medications Ordered in ED Medications - No data to display  ED Course/ Medical Decision Making/ A&P                                 Medical  Decision Making Amount and/or Complexity of Data Reviewed Labs: ordered. Radiology: ordered.   This patient presents to the ED for concern of chest pain and headache, this involves an extensive number of treatment options, and is a complaint that carries with it a high risk of complications and morbidity.  The differential diagnosis includes ACS, PE, pneumonia, anxiety, musculoskeletal pain.  The differential diagnosis for the headache includes tension headache, cluster headache, migraine, other headache disorder, intracranial abnormality   Co morbidities that complicate the patient evaluation  Type II DM, TTP syndrome, hypertension   Additional history obtained:   External records from outside source obtained and reviewed including recent cardiology notes   Lab Tests:  I Ordered, and personally interpreted labs.  The pertinent results include: Initial troponin 8, repeat 9; unremarkable BMP and CBC   Imaging Studies ordered:  I ordered imaging studies including chest x-ray and CT head without contrast I independently visualized and interpreted imaging which showed no acute findings I agree with the radiologist interpretation   Cardiac Monitoring: / EKG:  The patient was maintained on a cardiac monitor.  I personally viewed and interpreted the cardiac monitored which showed an underlying rhythm of: Sinus rhythm with PVCs   Social Determinants of Health:  Patient has Medicaid for his primary health insurance type   Test / Admission - Considered:  The patient has no clear etiology for his symptoms.  He does seem anxious and a question of anxiety may be playing some role in his fluctuating blood pressures along with his symptoms.  He had a extensive workup with cardiology over the past month which was grossly unremarkable.  His headaches are described as both mild and the worst of his life but no acute findings were found on CT.  The patient is ambulatory, he has negative  troponins x 2 and a low heart score.  I see no indication for further emergent workup or admission to the hospital this time.  Plan to discharge home with recommendations for continued follow-up with cardiology and primary care as needed.  Return precautions provided.         Final Clinical Impression(s) / ED Diagnoses Final diagnoses:  Chest pain, unspecified type  Nonintractable headache, unspecified  chronicity pattern, unspecified headache type    Rx / DC Orders ED Discharge Orders     None         Pamala Duffel 12/18/22 0429    Tilden Fossa, MD 12/18/22 [phone]

## 2022-12-21 ENCOUNTER — Inpatient Hospital Stay: Payer: Medicaid Other | Admitting: Nurse Practitioner

## 2022-12-21 ENCOUNTER — Inpatient Hospital Stay: Payer: Medicaid Other

## 2022-12-22 ENCOUNTER — Encounter (HOSPITAL_COMMUNITY): Payer: Self-pay

## 2022-12-22 ENCOUNTER — Other Ambulatory Visit: Payer: Self-pay

## 2022-12-22 ENCOUNTER — Emergency Department (HOSPITAL_COMMUNITY): Payer: Medicaid Other

## 2022-12-22 ENCOUNTER — Emergency Department (HOSPITAL_COMMUNITY)
Admission: EM | Admit: 2022-12-22 | Discharge: 2022-12-23 | Disposition: A | Discharge status: Home / Self Care | Payer: Medicaid Other | Attending: Emergency Medicine | Admitting: Emergency Medicine

## 2022-12-22 DIAGNOSIS — Z7982 Long term (current) use of aspirin: Secondary | ICD-10-CM | POA: Diagnosis not present

## 2022-12-22 DIAGNOSIS — K219 Gastro-esophageal reflux disease without esophagitis: Secondary | ICD-10-CM | POA: Diagnosis not present

## 2022-12-22 DIAGNOSIS — R42 Dizziness and giddiness: Secondary | ICD-10-CM | POA: Diagnosis not present

## 2022-12-22 DIAGNOSIS — Z7984 Long term (current) use of oral hypoglycemic drugs: Secondary | ICD-10-CM | POA: Insufficient documentation

## 2022-12-22 DIAGNOSIS — R079 Chest pain, unspecified: Secondary | ICD-10-CM | POA: Diagnosis present

## 2022-12-22 DIAGNOSIS — E119 Type 2 diabetes mellitus without complications: Secondary | ICD-10-CM | POA: Insufficient documentation

## 2022-12-22 DIAGNOSIS — K2100 Gastro-esophageal reflux disease with esophagitis, without bleeding: Secondary | ICD-10-CM

## 2022-12-22 LAB — CBC
HCT: 54.3 % — ABNORMAL HIGH (ref 39.0–52.0)
Hemoglobin: 18.6 g/dL — ABNORMAL HIGH (ref 13.0–17.0)
MCH: 31.1 pg (ref 26.0–34.0)
MCHC: 34.3 g/dL (ref 30.0–36.0)
MCV: 90.7 fL (ref 80.0–100.0)
Platelets: 187 10*3/uL (ref 150–400)
RBC: 5.99 MIL/uL — ABNORMAL HIGH (ref 4.22–5.81)
RDW: 13.8 % (ref 11.5–15.5)
WBC: 10.2 10*3/uL (ref 4.0–10.5)
nRBC: 0 % (ref 0.0–0.2)

## 2022-12-22 NOTE — ED Triage Notes (Signed)
Pt arrived pov reporting he got a few vaccines a week ago and started feeling bad after. Reports chest pressure and headache. Patient recently evaluated for similar symptoms.

## 2022-12-22 NOTE — ED Provider Triage Note (Signed)
Emergency Medicine Provider Triage Evaluation Note  Eric Hooper , a 47 y.o. male  was evaluated in triage.  Pt complains of chest pressure and headache. Symptoms have been going on for 3 days now. Got 4 vaccines at [facility] a few days ago and since then has been having a pressure like sensation in his upper chest, radiating up his neck and into his head, giving him a headache. Went to the ER on 8/22, was told he was not having a heart attack and was discharged. Symptoms have continued with no improvement. Tried taking alka-seltzer with minimal relief.   Hx of type II DM, TTP syndrome, GERD, renal insufficiency, headaches, chest wall pain, tobacco use disorder  Review of Systems  Positive: Chest pressure, SOB, headache Negative: syncope  Physical Exam  There were no vitals taken for this visit. Gen:   Awake, no distress   Resp:  Normal effort  MSK:   Moves extremities without difficulty  Other:    Medical Decision Making  Medically screening exam initiated at 3:39 PM.  Appropriate orders placed.  Livingston Lyerly was informed that the remainder of the evaluation will be completed by another provider, this initial triage assessment does not replace that evaluation, and the importance of remaining in the ED until their evaluation is complete.  Workup initiated. Reviewed ER note from 8/22, patient had normal lab work, troponins, CXR, and head CT and was discharged.   Trevar Boehringer T, PA-C 12/22/22 1539

## 2022-12-23 LAB — HEPATIC FUNCTION PANEL
ALT: 36 U/L (ref 0–44)
AST: 22 U/L (ref 15–41)
Albumin: 4 g/dL (ref 3.5–5.0)
Alkaline Phosphatase: 100 U/L (ref 38–126)
Bilirubin, Direct: 0.2 mg/dL (ref 0.0–0.2)
Indirect Bilirubin: 1.4 mg/dL — ABNORMAL HIGH (ref 0.3–0.9)
Total Bilirubin: 1.6 mg/dL — ABNORMAL HIGH (ref 0.3–1.2)
Total Protein: 6.7 g/dL (ref 6.5–8.1)

## 2022-12-23 LAB — TROPONIN I (HIGH SENSITIVITY): Troponin I (High Sensitivity): 8 ng/L (ref <18)

## 2022-12-23 LAB — BASIC METABOLIC PANEL
Anion gap: 11 (ref 5–15)
BUN: 14 mg/dL (ref 6–20)
CO2: 27 mmol/L (ref 22–32)
Calcium: 9.5 mg/dL (ref 8.9–10.3)
Chloride: 105 mmol/L (ref 98–111)
Creatinine, Ser: 0.84 mg/dL (ref 0.61–1.24)
GFR, Estimated: >60 mL/min (ref >60)
Glucose, Bld: 71 mg/dL (ref 70–99)
Potassium: 3.8 mmol/L (ref 3.5–5.1)
Sodium: 143 mmol/L (ref 135–145)

## 2022-12-23 LAB — LIPASE, BLOOD: Lipase: 41 U/L (ref 11–51)

## 2022-12-23 MED ORDER — ALUM & MAG HYDROXIDE-SIMETH 200-200-20 MG/5ML PO SUSP
30.0000 mL | Freq: Once | ORAL | Status: AC
  Administered 2022-12-23: 30 mL via ORAL
  Filled 2022-12-23: qty 30

## 2022-12-23 MED ORDER — ESOMEPRAZOLE MAGNESIUM 40 MG PO CPDR: 40.0000 mg | DELAYED_RELEASE_CAPSULE | Freq: Every day | ORAL | 0 refills | Status: DC

## 2022-12-23 MED ORDER — LACTATED RINGERS IV BOLUS
1000.0000 mL | Freq: Once | INTRAVENOUS | Status: AC
  Administered 2022-12-23: 1000 mL via INTRAVENOUS

## 2022-12-23 MED ORDER — LIDOCAINE VISCOUS HCL 2 % MT SOLN
15.0000 mL | Freq: Once | OROMUCOSAL | Status: AC
  Administered 2022-12-23: 15 mL via ORAL
  Filled 2022-12-23: qty 15

## 2022-12-23 NOTE — ED Notes (Signed)
After standing for 3 min the pts oxygen dropped down to 88, asked of he was dizzy or light headed he stated he is always dizzy/light headed.

## 2022-12-23 NOTE — Discharge Instructions (Signed)
Please follow-up with the GI specialist I have attached you for you for your symptoms.  Today your labs were reassuring and you most likely have acid reflux with esophagitis.  I have refilled your Nexium however you may also take Claritin or Zyrtec in the morning if you feel you are having allergic reactions.  If symptoms change or worsen please return to ER.

## 2022-12-23 NOTE — ED Provider Notes (Signed)
[place] EMERGENCY DEPARTMENT AT [facility] Provider Note   CSN: [id number] Arrival date & time: 12/22/22  1521     History  Chief Complaint  Patient presents with   Chest Pain    Temple Miyamoto is a 47 y.o. male history of thrombocytopenia, diabetes, GERD presented with throat cloudiness for the past few days.  Patient notes that he had 4 vaccines on Thursday but is unsure what they were and since then has been having this cloudiness in his throat that he cannot further describe.  Patient's been able to eat and drink without issue and denies any voice changes.  Patient also notes that he gets dizzy in which he feels unstable and states this occurs when he gets this throat cloudiness or when he goes from a lying to a standing position.  Patient denies LOC, blood thinners, falls, hitting head, neck pain, fevers.    Home Medications Prior to Admission medications   Medication Sig Start Date End Date Taking? Authorizing Provider  esomeprazole (NEXIUM) 40 MG capsule Take 1 capsule (40 mg total) by mouth daily. 12/23/22  Yes Netta Corrigan, PA-C  acetaminophen (TYLENOL) 325 MG tablet Take 2 tablets (650 mg total) by mouth every 4 (four) hours as needed (discomfort or fever). 11/29/21   Jerre Simon, MD  albuterol (VENTOLIN HFA) 108 (90 Base) MCG/ACT inhaler INHALE TWO PUFFS INTO THE LUNGS EVERY 6 HOURS AS NEEDED. Patient not taking: Reported on 11/06/2022 05/21/22   Lilland, Alana, DO  amLODipine (NORVASC) 10 MG tablet Take 1 tablet (10 mg total) by mouth at bedtime. 11/09/22   Caro Laroche, DO  aspirin EC 81 MG tablet Take 1 tablet (81 mg total) by mouth daily. Swallow whole. 11/26/22   Maisie Fus, MD  atorvastatin (LIPITOR) 40 MG tablet TAKE 1 TABLET BY MOUTH EVERY DAY 09/14/22   Lilland, Alana, DO  benzoyl peroxide 5 % gel Apply topically 2 (two) times daily. 02/23/22   Lilland, Alana, DO  blood glucose meter kit and supplies KIT Dispense based on patient and insurance  preference. Use up to four times daily as directed. (FOR ICD-9 250.00, 250.01). 08/25/18   Garnette Gunner, MD  blood glucose meter kit and supplies Dispense based on patient and insurance preference. Use up to four times daily as directed. (FOR ICD-10 E10.9, E11.9). 09/02/18   Garnette Gunner, MD  cetirizine (ZYRTEC ALLERGY) 10 MG tablet Take 1 tablet (10 mg total) by mouth daily. Patient not taking: Reported on 11/06/2022 08/13/22   Carlisle Beers, FNP  diclofenac Sodium (VOLTAREN) 1 % GEL Apply 2 g topically 4 (four) times daily. 09/23/22   Garrison, [place] N, FNP  empagliflozin (JARDIANCE) 10 MG TABS tablet Take 1 tablet (10 mg total) by mouth daily. 06/09/22   Lilland, Alana, DO  EPINEPHrine 0.3 mg/0.3 mL IJ SOAJ injection Inject 0.3 mg into the muscle as needed for anaphylaxis. 08/13/22   Carlisle Beers, FNP  erythromycin with ethanol (EMGEL) 2 % gel Apply daily to affected areas 10/08/22   Terri Piedra, DO  losartan (COZAAR) 100 MG tablet Take 1 tablet (100 mg total) by mouth at bedtime. 11/09/22   Caro Laroche, DO  metoprolol tartrate (LOPRESSOR) 50 MG tablet Take 1 tablet (50 mg total) by mouth once for 1 dose. PLEASE TAKE METOPROLOL 2  HOURS PRIOR TO CTA SCAN. 11/03/22 11/03/22  Maisie Fus, MD  Multiple Vitamin (MULTIVITAMIN) capsule Take 1 capsule by mouth daily.  [provider]  tretinoin (RETIN-A) 0.05 % cream Apply topically at bedtime. USE EVERY OTHER NIGHT FOR 1 MONTH, AFTER ONE MONTH INCREASE USE TO NIGHTLY 10/08/22 10/08/23  Terri Piedra, DO      Allergies    Ibuprofen    Review of Systems   Review of Systems  Cardiovascular:  Positive for chest pain.    Physical Exam Updated Vital Signs BP (!) 159/86   Pulse (!) 50   Temp 97.6 F (36.4 C) (Oral)   Resp 18   Ht 5\' 10"  (1.778 m)   Wt 87 kg   SpO2 97%   BMI 27.52 kg/m  Physical Exam Vitals reviewed.  Constitutional:      General: He is not in acute distress. HENT:     Head:  Normocephalic and atraumatic.     Comments: No facial swelling    Right Ear: Tympanic membrane, ear canal and external ear normal.     Left Ear: Tympanic membrane, ear canal and external ear normal.     Mouth/Throat:     Mouth: Mucous membranes are moist.     Pharynx: No oropharyngeal exudate or posterior oropharyngeal erythema.     Comments: Tolerating secretions No muffled voice Eyes:     Extraocular Movements: Extraocular movements intact.     Conjunctiva/sclera: Conjunctivae normal.     Pupils: Pupils are equal, round, and reactive to light.  Neck:     Comments: No neck swelling noted Cardiovascular:     Rate and Rhythm: Normal rate and regular rhythm.     Pulses: Normal pulses.     Heart sounds: Normal heart sounds.     Comments: 2+ bilateral radial/dorsalis pedis pulses with regular rate Pulmonary:     Effort: Pulmonary effort is normal. No respiratory distress.     Breath sounds: Normal breath sounds.  Abdominal:     Palpations: Abdomen is soft.     Tenderness: There is no abdominal tenderness. There is no guarding or rebound.  Musculoskeletal:        General: Normal range of motion.     Cervical back: Normal range of motion and neck supple.     Comments: 5 out of 5 bilateral grip/leg extension strength  Skin:    General: Skin is warm and dry.     Capillary Refill: Capillary refill takes less than 2 seconds.  Neurological:     General: No focal deficit present.     Mental Status: He is alert and oriented to person, place, and time.     Sensory: Sensation is intact.     Motor: Motor function is intact.     Coordination: Coordination is intact.     Gait: Gait is intact.     Comments: Sensation intact in all 4 limbs Vision grossly intact Cranial nerves III through XII intact Negative Dix-Hallpike to both sides  Psychiatric:        Mood and Affect: Mood normal.     ED Results / Procedures / Treatments   Labs (all labs ordered are listed, but only abnormal results  are displayed) Labs Reviewed  CBC - Abnormal; Notable for the following components:      Result Value   RBC 5.99 (*)    Hemoglobin 18.6 (*)    HCT 54.3 (*)    All other components within normal limits  HEPATIC FUNCTION PANEL - Abnormal; Notable for the following components:   Total Bilirubin 1.6 (*)    Indirect Bilirubin 1.4 (*)  All other components within normal limits  BASIC METABOLIC PANEL  LIPASE, BLOOD  TROPONIN I (HIGH SENSITIVITY)  TROPONIN I (HIGH SENSITIVITY)    EKG EKG Interpretation Date/Time:  Tuesday December 22 2022 16:45:52 EDT Ventricular Rate:  64 PR Interval:  131 QRS Duration:  110 QT Interval:  421 QTC Calculation: 435 R Axis:   102  Text Interpretation: Sinus rhythm Ventricular bigeminy Right axis deviation Nonspecific T abnormalities, inferior leads in a pattern of bigeminy Confirmed by Glynn Octave [phone]) on 12/23/2022 12:09:18 AM  Radiology DG Chest 2 View  Result Date: 12/22/2022 CLINICAL DATA:  Chest pain. Unwell feeling after vaccines a week ago. Chest pressure and headache. EXAM: CHEST - 2 VIEW COMPARISON:  12/17/2022 FINDINGS: The heart size and mediastinal contours are within normal limits. Both lungs are clear. The visualized skeletal structures are unremarkable. IMPRESSION: No active cardiopulmonary disease. Electronically Signed   By: Burman Nieves M.D.   On: 12/22/2022 17:15    Procedures Procedures    Medications Ordered in ED Medications  alum & mag hydroxide-simeth (MAALOX/MYLANTA) 200-200-20 MG/5ML suspension 30 mL (30 mLs Oral Given 12/23/22 0023)    And  lidocaine (XYLOCAINE) 2 % viscous mouth solution 15 mL (15 mLs Oral Given 12/23/22 0023)  lactated ringers bolus 1,000 mL (1,000 mLs Intravenous New Bag/Given 12/23/22 0136)    ED Course/ Medical Decision Making/ A&P                                 Medical Decision Making Amount and/or Complexity of Data Reviewed Labs: ordered.  Risk OTC drugs. Prescription drug  management.   Somtochukwu Plourde 47 y.o. presented today for throat cloudiness. Working DDx that I considered at this time includes, but not limited to, GERD, ACS, airway compromise, cellulitis, angioedema, anaphylaxis.  R/o DDx: ACS, airway compromise, cellulitis, angioedema, anaphylaxis: These are considered less likely due to history of present illness, physical exam, labs/imaging findings  Review of prior external notes: 12/17/2022 ED  Unique Tests and My Interpretation:  CBC: Unremarkable BMP: Unremarkable Hepatic function panel: Slightly increased total bilirubin and indirect bilirubin however patient is not icteric, jaundiced or having any abdominal tenderness or complaints of abdominal pain Troponin: 8 Chest x-ray: Unremarkable EKG: Sinus 64 bpm with bigeminy  Discussion with Independent Historian: None  Discussion of Management of Tests: None  Risk: Medium: prescription drug management  Risk Stratification Score: None  Staffed with Rancour, MD  Plan: On exam patient was in no acute distress stable vitals.  Patient's physical exam is largely unremarkable.  Patient's history sounds suspicious for possible acid reflux as patient does have history of GERD but does not take his Nexium.  1 set of troponin will be ordered as patient has endorsed symptoms for the past few days and they have remained constant.  Patient was given GI cocktail and labs will be ordered to further evaluate.  Labs reassuring.  Patient states that the GI cocktail helped mildly.  At this time with reassuring labs I suspect patient has acid reflux with esophagitis and will have him follow-up with GI as he has had multiple visits in the past few days for this chief concern.  Patient asked if allergies could cause this and I recommended he take Claritin or Zyrtec in the morning and Benadryl at night.  Patient is not currently having any angioedema or anaphylaxis and so I have low suspicion of allergic reaction causing  patient's symptoms.  Patient is been in the ER for 12 hours and has remained stable and is safe to be discharged at this time.  Patient was given return precautions. Patient stable for discharge at this time.  Patient verbalized understanding of plan.         Final Clinical Impression(s) / ED Diagnoses Final diagnoses:  Gastroesophageal reflux disease with esophagitis without hemorrhage    Rx / DC Orders ED Discharge Orders          Ordered    Ambulatory referral to Gastroenterology        12/23/22 0322    esomeprazole (NEXIUM) 40 MG capsule  Daily        12/23/22 0322              Netta Corrigan, PA-C 12/23/22 0324    Glynn Octave, MD 12/25/22 [phone]

## 2022-12-24 ENCOUNTER — Other Ambulatory Visit: Payer: Self-pay | Admitting: Oncology

## 2022-12-24 DIAGNOSIS — M3119 Other thrombotic microangiopathy: Secondary | ICD-10-CM

## 2022-12-24 NOTE — Telephone Encounter (Signed)
Hold for appointment on 12/30/22 per Dr. Truett Perna

## 2022-12-30 ENCOUNTER — Encounter: Payer: Self-pay | Admitting: Nurse Practitioner

## 2022-12-30 ENCOUNTER — Inpatient Hospital Stay: Payer: Medicaid Other | Attending: Oncology

## 2022-12-30 ENCOUNTER — Inpatient Hospital Stay: Payer: Medicaid Other | Admitting: Nurse Practitioner

## 2022-12-30 VITALS — BP 134/95 | HR 83 | Temp 98.1°F | Resp 18 | Wt 190.9 lb

## 2022-12-30 DIAGNOSIS — M3119 Other thrombotic microangiopathy: Secondary | ICD-10-CM

## 2022-12-30 DIAGNOSIS — Z720 Tobacco use: Secondary | ICD-10-CM | POA: Diagnosis not present

## 2022-12-30 DIAGNOSIS — E1159 Type 2 diabetes mellitus with other circulatory complications: Secondary | ICD-10-CM | POA: Diagnosis not present

## 2022-12-30 DIAGNOSIS — I10 Essential (primary) hypertension: Secondary | ICD-10-CM | POA: Diagnosis not present

## 2022-12-30 DIAGNOSIS — E876 Hypokalemia: Secondary | ICD-10-CM | POA: Insufficient documentation

## 2022-12-30 LAB — CBC WITH DIFFERENTIAL (CANCER CENTER ONLY)
Abs Immature Granulocytes: 0.02 10*3/uL (ref 0.00–0.07)
Basophils Absolute: 0 10*3/uL (ref 0.0–0.1)
Basophils Relative: 0 %
Eosinophils Absolute: 0.2 10*3/uL (ref 0.0–0.5)
Eosinophils Relative: 2 %
HCT: 49.5 % (ref 39.0–52.0)
Hemoglobin: 17.6 g/dL — ABNORMAL HIGH (ref 13.0–17.0)
Immature Granulocytes: 0 %
Lymphocytes Relative: 35 %
Lymphs Abs: 4.1 10*3/uL — ABNORMAL HIGH (ref 0.7–4.0)
MCH: 31.4 pg (ref 26.0–34.0)
MCHC: 35.6 g/dL (ref 30.0–36.0)
MCV: 88.4 fL (ref 80.0–100.0)
Monocytes Absolute: 0.7 10*3/uL (ref 0.1–1.0)
Monocytes Relative: 6 %
Neutro Abs: 6.5 10*3/uL (ref 1.7–7.7)
Neutrophils Relative %: 57 %
Platelet Count: 204 10*3/uL (ref 150–400)
RBC: 5.6 MIL/uL (ref 4.22–5.81)
RDW: 13.4 % (ref 11.5–15.5)
WBC Count: 11.5 10*3/uL — ABNORMAL HIGH (ref 4.0–10.5)
nRBC: 0 % (ref 0.0–0.2)

## 2022-12-30 LAB — CMP (CANCER CENTER ONLY)
ALT: 33 U/L (ref 0–44)
AST: 19 U/L (ref 15–41)
Albumin: 4.4 g/dL (ref 3.5–5.0)
Alkaline Phosphatase: 107 U/L (ref 38–126)
Anion gap: 7 (ref 5–15)
BUN: 16 mg/dL (ref 6–20)
CO2: 27 mmol/L (ref 22–32)
Calcium: 9.3 mg/dL (ref 8.9–10.3)
Chloride: 107 mmol/L (ref 98–111)
Creatinine: 0.95 mg/dL (ref 0.61–1.24)
GFR, Estimated: >60 mL/min (ref >60)
Glucose, Bld: 116 mg/dL — ABNORMAL HIGH (ref 70–99)
Potassium: 3.2 mmol/L — ABNORMAL LOW (ref 3.5–5.1)
Sodium: 141 mmol/L (ref 135–145)
Total Bilirubin: 1.2 mg/dL (ref 0.3–1.2)
Total Protein: 6.9 g/dL (ref 6.5–8.1)

## 2022-12-30 LAB — LACTATE DEHYDROGENASE: LDH: 149 U/L (ref 98–192)

## 2022-12-30 NOTE — Progress Notes (Signed)
[facility] OFFICE PROGRESS NOTE   Diagnosis: TTP  INTERVAL HISTORY:   Eric Hooper returns for follow-up.  No bleeding.  He has occasional blurry vision.  No fever.  No interim infections.  He has a good appetite.  Objective:  Vital signs in last 24 hours:  Blood pressure (!) 134/95, pulse 83, temperature 98.1 F (36.7 C), temperature source Tympanic, resp. rate 18, weight 190 lb 14.4 oz (86.6 kg), SpO2 100%.    Resp: Lungs clear bilaterally. Cardio: Regular rate and rhythm. GI: No hepatosplenomegaly. Vascular: No leg edema.   Lab Results:  Lab Results  Component Value Date   WBC 11.5 (H) 12/30/2022   HGB 17.6 (H) 12/30/2022   HCT 49.5 12/30/2022   MCV 88.4 12/30/2022   PLT 204 12/30/2022   NEUTROABS 6.5 12/30/2022    Imaging:  No results found.  Medications: I have reviewed the patient's current medications.  Assessment/Plan: TTP initially diagnosed in October 2018 with recurrence in April 2020 and July 2023. Initiation of daily plasma exchange and Solu-Medrol 02/02/2017 [name] Antibody- 72, activity less than 2% on initial presentation; [name] from 08/15/2018 <2.0. Last plasma exchange 02/06/2017 Prednisone 60 mg daily beginning 02/08/2017 Prednisone 40 mg daily beginning 02/17/2017 Prednisone 30 mg daily beginning 03/09/2017 Prednisone 20 mg daily beginning 03/23/2017 Prednisone 10 mg daily beginning 04/01/2017 Prednisone 5 mg daily for 5 days then 5 mg every other day for 5 doses then stop beginning 05/11/2017 Recurrent TTP 08/14/2018 Plasma exchange/steroids started on 08/15/2018.  Plasma exchange discontinued after treatment on 08/19/2019, discharged on prednisone 60mg  daily Prednisone taper to 40 mg daily 08/22/2018 Weekly rituximab for 4 doses beginning 08/25/2018 Plasma exchange resumed 08/26/2018, 08/27/2018, 08/28/2018, 08/29/2018, 08/31/2018, 09/02/2018 Prednisone taper to 30 mg daily 09/01/2018 Cycle 3 weekly Rituxan 09/08/2018 Prednisone taper to  20 mg daily 09/08/2018 Cycle 4 weekly Rituxan 09/15/2018 Prednisone taper to 10 mg daily 09/20/2018 Prednisone taper to 5 mg daily 09/27/2018 Prednisone discontinued 10/07/2018 Admission with relapse of TTP 10/23/2021 Daily Plasma exchange initiated 11/23/2021 prednisone 8/1 Prednisone tapered to 40 mg daily beginning 12/04/2021 Weekly Rituxan for 4 doses beginning 12/04/2021 Discontinue plasmapheresis after appointment 12/05/2021 Admitted to [facility] 12/11/2021-plasmapheresis reinitiated, Cablivi started 12/12/2021, prednisone dose currently 80 mg daily Every other day plasma exchange beginning 12/28/2021-completed 01/09/2022 Second weekly Rituxan 12/31/2021 Prednisone taper to 60 mg daily beginning 12/31/2021 Third weekly Rituxan 01/06/2022 Prednisone taper to 40 mg daily beginning 01/07/2022 Fourth weekly rituximab 01/13/2022 Prednisone taper to 30 mg daily 01/13/2022 Prednisone taper to 20 mg daily 01/20/2022 Prednisone taper to 15 mg daily 02/04/2022 Per patient report 02/19/2022 he has continued prednisone 30 mg daily and did not make the above adjustments, he will decrease prednisone to 20 mg daily today Prednisone taper to 10 mg daily 03/09/2022 Prednisone taper to 5 mg daily 03/26/2022 Prednisone taper to 5 mg every other day for 5 doses beginning 04/16/2022 2. Xiphoid pain-etiology unclear 3. Hypertension 4. Diabetes 5. Alcohol/Tobacco use 6. History of renal insufficiency 7. Right IJ dialysis catheter placed 02/02/2017, removed New right IJ dialysis catheter 08/15/2018 Catheter removed 09/12/2018 New right IJ dialysis catheter placed 11/23/2021 Temporary hemodialysis catheter converted to tunneled hemodialysis catheter 11/28/2021     Disposition: Eric Hooper remains in clinical remission from TTP.  We will follow-up on the ADAMTS level from today.  He has mild hypokalemia.  Reports oral potassium was recently discontinued.  He will resume potassium 20 meq daily, follow-up with PCP for monitoring.   We will forward a copy of today's  labs to his PCP.  He will return for lab and follow-up in 3 months.    Eric Hooper ANP/GNP-BC   12/30/2022  2:35 PM

## 2022-12-31 ENCOUNTER — Ambulatory Visit (INDEPENDENT_AMBULATORY_CARE_PROVIDER_SITE_OTHER): Payer: Medicaid Other | Admitting: Dermatology

## 2022-12-31 ENCOUNTER — Encounter: Payer: Self-pay | Admitting: Dermatology

## 2022-12-31 VITALS — BP 123/83

## 2022-12-31 DIAGNOSIS — L7211 Pilar cyst: Secondary | ICD-10-CM | POA: Diagnosis not present

## 2022-12-31 DIAGNOSIS — L729 Follicular cyst of the skin and subcutaneous tissue, unspecified: Secondary | ICD-10-CM

## 2022-12-31 DIAGNOSIS — L739 Follicular disorder, unspecified: Secondary | ICD-10-CM | POA: Diagnosis not present

## 2022-12-31 LAB — ADAMTS13 ACTIVITY: Adamts 13 Activity: 65.2 % — ABNORMAL LOW (ref >66.8)

## 2022-12-31 LAB — ADAMTS13 ACTIVITY REFLEX

## 2022-12-31 MED ORDER — DOXYCYCLINE HYCLATE 100 MG PO TABS: 100.0000 mg | ORAL_TABLET | Freq: Two times a day (BID) | ORAL | 1 refills | Status: AC

## 2022-12-31 NOTE — Progress Notes (Signed)
   Follow-Up Visit   Subjective  Eric Hooper is a 47 y.o. male who presents for the following: He presents today for follow up of folliculitis. He is better but he continues to get new spots. He is treating with Clindamycin mixed with Cerave daily and spot treating with tretinoin. He is washing with Cerave foaming face wash.  He also has a cyst of his right forehead that was injected with Kenalog at last appointment but no changes.   The following portions of the chart were reviewed this encounter and updated as appropriate: medications, allergies, medical history  Review of Systems:  No other skin or systemic complaints except as noted in HPI or Assessment and Plan.  Objective  Well appearing patient in no apparent distress; mood and affect are within normal limits.   A focused examination was performed of the following areas:   Relevant exam findings are noted in the Assessment and Plan.    Assessment & Plan   FOLLICULITIS Exam: Few perifollicular papules but mostly hyperpigmented macules in previous sites - Improved.    Treatment Plan:  Erythromycin gel mixed with Cerave lotion every morning, Tretinoin cream mixed with Cerave lotion at bedtime.  Continue washing with Cerave 4% BP foaming cleanser daily.  Start Doxycycline 100 mg 1 tablet twice daily with food and plenty of fluid for 5 days prn flares  Advised patient to take a picture of the medications that he is using and bring it to his follow up appointment.  PILAR CYST Exam: 15 mm soft SQ nodule at right forehead    Benign-appearing. Exam most consistent with an epidermal inclusion cyst. Discussed that a cyst is a benign growth that can grow over time and sometimes get irritated or inflamed. Recommend observation if it is not bothersome. Discussed option of surgical excision to remove it if it is growing, symptomatic, or other changes noted. Please call for new or changing lesions so they can be  evaluated.  He would like to scahedule an appointment with Dr Caralyn Guile for excision.    Return in about 3 months (around 04/01/2023) for Follow up.  I, Joanie Coddington, CMA, am acting as scribe for [employer], DO .   Documentation: I have reviewed the above documentation for accuracy and completeness, and I agree with the above.  Langston Reusing, DO

## 2022-12-31 NOTE — Patient Instructions (Signed)
Dear Eric Hooper,  Thank you for visiting my office today. Your dedication to enhancing your skin health is greatly appreciated. Below is a summary of our discussion and the outlined treatment plan:  - Daily Skincare Routine:   - Morning: Wash with CeraVe 4% benzoyl peroxide foam. Mix erythromycin gel with CeraVe lotion and apply to your chest, back, and neck.   - Night: Apply tretinoin cream mixed with the same lotion to your chest, back, and neck.   - Medications:   - Tretinoin Cream: To be used at night as directed. A new prescription will be sent to your pharmacy.   - Erythromycin Gel: To be used in the morning as part of your skincare routine.   - Doxycycline: Take 100 mg twice a day for five days during flare-ups, with food to minimize stomach upset.  - Follow-Up Appointments:   - Skin Evaluation: Return in three months to assess the dark spots and review the frequency of doxycycline use.   - Surgical Consultation: Schedule an appointment with our surgeon for the removal of the pilar cyst.  - Additional Instructions:   - Please photograph all medications and products obtained from the pharmacy and send the images to [place] for review.  Your cooperation and diligence in adhering to this treatment plan are crucial for its success. Should you have any questions, or if the tretinoin cream is not received, do not hesitate to send me a message.  Best regards,  Dr. [facility] Reusing [facility]   Important Information  Due to recent changes in healthcare laws, you may see results of your pathology and/or laboratory studies on MyChart before the doctors have had a chance to review them. We understand that in some cases there may be results that are confusing or concerning to you. Please understand that not all results are received at the same time and often the doctors may need to interpret multiple results in order to provide you with the best plan of care or course of treatment. Therefore,  we ask that you please give [place] 2 business days to thoroughly review all your results before contacting the office for clarification. Should we see a critical lab result, you will be contacted sooner.   If You Need Anything After Your Visit  If you have any questions or concerns for your doctor, please call our main line at [phone] If no one answers, please leave a voicemail as directed and we will return your call as soon as possible. Messages left after 4 pm will be answered the following business day.   You may also send [place] a message via MyChart. We typically respond to MyChart messages within 1-2 business days.  For prescription refills, please ask your pharmacy to contact our office. Our fax number is [phone].  If you have an urgent issue when the clinic is closed that cannot wait until the next business day, you can page your doctor at the number below.    Please note that while we do our best to be available for urgent issues outside of office hours, we are not available 24/7.   If you have an urgent issue and are unable to reach [place], you may choose to seek medical care at your doctor's office, retail clinic, urgent care center, or emergency room.  If you have a medical emergency, please immediately call 911 or go to the emergency department. In the event of inclement weather, please call our main line at [phone] for an update on  the status of any delays or closures.  Dermatology Medication Tips: Please keep the boxes that topical medications come in in order to help keep track of the instructions about where and how to use these. Pharmacies typically print the medication instructions only on the boxes and not directly on the medication tubes.   If your medication is too expensive, please contact our office at [phone] or send [place] a message through MyChart.   We are unable to tell what your co-pay for medications will be in advance as this is different depending on  your insurance coverage. However, we may be able to find a substitute medication at lower cost or fill out paperwork to get insurance to cover a needed medication.   If a prior authorization is required to get your medication covered by your insurance company, please allow [place] 1-2 business days to complete this process.  Drug prices often vary depending on where the prescription is filled and some pharmacies may offer cheaper prices.  The website www.goodrx.com contains coupons for medications through different pharmacies. The prices here do not account for what the cost may be with help from insurance (it may be cheaper with your insurance), but the website can give you the price if you did not use any insurance.  - You can print the associated coupon and take it with your prescription to the pharmacy.  - You may also stop by our office during regular business hours and pick up a GoodRx coupon card.  - If you need your prescription sent electronically to a different pharmacy, notify our office through [facility] or by phone at [phone]

## 2023-01-01 ENCOUNTER — Ambulatory Visit (INDEPENDENT_AMBULATORY_CARE_PROVIDER_SITE_OTHER): Payer: Medicaid Other | Admitting: Physician Assistant

## 2023-01-01 ENCOUNTER — Encounter: Payer: Self-pay | Admitting: Physician Assistant

## 2023-01-01 DIAGNOSIS — K219 Gastro-esophageal reflux disease without esophagitis: Secondary | ICD-10-CM

## 2023-01-01 DIAGNOSIS — R1310 Dysphagia, unspecified: Secondary | ICD-10-CM

## 2023-01-01 DIAGNOSIS — D126 Benign neoplasm of colon, unspecified: Secondary | ICD-10-CM

## 2023-01-01 DIAGNOSIS — R1013 Epigastric pain: Secondary | ICD-10-CM | POA: Diagnosis not present

## 2023-01-01 MED ORDER — FAMOTIDINE 40 MG PO TABS: 40.0000 mg | ORAL_TABLET | Freq: Two times a day (BID) | ORAL | 6 refills | Status: DC

## 2023-01-01 MED ORDER — NA SULFATE-K SULFATE-MG SULF 17.5-3.13-1.6 GM/177ML PO SOLN: 1.0000 | ORAL | 0 refills | Status: DC

## 2023-01-01 MED ORDER — ESOMEPRAZOLE MAGNESIUM 40 MG PO CPDR: 40.0000 mg | DELAYED_RELEASE_CAPSULE | Freq: Two times a day (BID) | ORAL | 6 refills | Status: DC

## 2023-01-01 NOTE — Patient Instructions (Addendum)
_______________________________________________________  If your blood pressure at your visit was 140/90 or greater, please contact your primary care physician to follow up on this.  If you are age 49 or younger, your body mass index should be between 19-25. Your Body mass index is 27.69 kg/m. If this is out of the aformentioned range listed, please consider follow up with your Primary Care Provider.  ________________________________________________________  The [facility] providers would like to encourage you to use [facility] to communicate with providers for non-urgent requests or questions.  Due to long hold times on the telephone, sending your provider a message by [facility] may be a faster and more efficient way to get a response.  Please allow 48 business hours for a response.  Please remember that this is for non-urgent requests.  _______________________________________________________  We have sent the following medications to your pharmacy for you to pick up at your convenience:  CONTINUE: Nexium 40mg  one capsule before breakfast and dinner meal each day.  START: Pepcid 40mg  one tablet after breakfast and one tablet after dinner meal each day.  You have been scheduled for an endoscopy and colonoscopy. Please follow the written instructions given to you at your visit today.  Please pick up your prep supplies at the pharmacy within the next 1-3 days.  If you use inhalers (even only as needed), please bring them with you on the day of your procedure.  DO NOT TAKE 7 DAYS PRIOR TO TEST- Trulicity (dulaglutide) Ozempic, Wegovy (semaglutide) Mounjaro (tirzepatide) Bydureon Bcise (exanatide extended release)  DO NOT TAKE 1 DAY PRIOR TO YOUR TEST Rybelsus (semaglutide) Adlyxin (lixisenatide) Victoza (liraglutide) Byetta (exanatide) ___________________________________________________________________________  Due to recent changes in healthcare laws, you may see the results of your  imaging and laboratory studies on MyChart before your provider has had a chance to review them.  We understand that in some cases there may be results that are confusing or concerning to you. Not all laboratory results come back in the same time frame and the provider may be waiting for multiple results in order to interpret others.  Please give [place] 48 hours in order for your provider to thoroughly review all the results before contacting the office for clarification of your results.   Thank you for entrusting me with your care and choosing [facility].  Amy Esterwood, PA-c

## 2023-01-03 ENCOUNTER — Encounter: Payer: Self-pay | Admitting: Physician Assistant

## 2023-01-03 NOTE — Progress Notes (Signed)
Subjective:    [name], male    DOB: 1975/12/11, 47 y.o.   MRN: [id number]  HPI Eric Hooper is a 47 year old African-American male established with Dr. Myrtie Neither not seen in our office since 2019, at which time he underwent EGD for GERD symptoms.  That was a normal exam and gastric biopsies were negative for H. pylori. He comes in today with complaints of persistent symptoms of heartburn and indigestion despite taking Nexium 40 mg p.o. twice daily.  He says he has been on twice daily dosing for some time and usually this had been effective up until a few months ago. He complains of having a heavy full feeling in his esophagus and a feeling like food and liquids sit in his esophagus for a long time then eventually traverse without regurgitation.  Appetite has been okay, weight has been stable, no changes in bowel habits, no melena or hematochezia. He is occasionally awakened by nocturnal sour brash type symptoms. He had recent ER visit on 12/22/2022 with what he was describing as a "burning and cloudiness "" in the esophagus and throat.  He is still describing the cloudiness but says to him that is more of a fullness and heavy feeling. At the time of the ER visit he had had 4 vaccines by his PCP a few days earlier, was also complaining of some dizziness and headache says the dizziness had been present off and on prior to the vaccines.  He is still having some of those symptoms off and on. No recent NSAIDs, no regular EtOH.  No other new medications just the vaccines.  No recent antibiotics.  He did have colonoscopy in 2017 while he was living in [place] had 2 rectal polyps removed and indication was follow-up in 3 to 5 years.  Path showed a 6 mm polyp and a 1 cm polyp 1 was a sessile serrated polyp and 1 was adenomatous.  Patient has history of TTP is followed by Dr. Truett Perna, very recent CBC normal.  He says he has not had any issues recently nor any need for steroids. Also has  hypertension, adult onset diabetes mellitus and diverticulosis.  Labs from 12/30/2022 WBC 11.5/hemoglobin 17.6/hematocrit 49.5/platelets 204 ADAMTS 13 activity 65.2, normal greater than 66.8   Review of Systems. Pertinent positive and negative review of systems were noted in the above HPI section.  All other review of systems was otherwise negative.   Outpatient Encounter Medications as of 01/01/2023  Medication Sig   acetaminophen (TYLENOL) 325 MG tablet Take 2 tablets (650 mg total) by mouth every 4 (four) hours as needed (discomfort or fever).   albuterol (VENTOLIN HFA) 108 (90 Base) MCG/ACT inhaler INHALE TWO PUFFS INTO THE LUNGS EVERY 6 HOURS AS NEEDED.   amLODipine (NORVASC) 10 MG tablet Take 1 tablet (10 mg total) by mouth at bedtime.   aspirin EC 81 MG tablet Take 1 tablet (81 mg total) by mouth daily. Swallow whole.   atorvastatin (LIPITOR) 40 MG tablet TAKE 1 TABLET BY MOUTH EVERY DAY   benzoyl peroxide 5 % gel Apply topically 2 (two) times daily.   blood glucose meter kit and supplies KIT Dispense based on patient and insurance preference. Use up to four times daily as directed. (FOR ICD-9 250.00, 250.01).   blood glucose meter kit and supplies Dispense based on patient and insurance preference. Use up to four times daily as directed. (FOR ICD-10 E10.9, E11.9).   diclofenac Sodium (VOLTAREN) 1 % GEL Apply 2 g  topically 4 (four) times daily.   doxycycline (VIBRA-TABS) 100 MG tablet Take 1 tablet (100 mg total) by mouth 2 (two) times daily. With food and plenty of fluid x 5 days as needed for flares   empagliflozin (JARDIANCE) 10 MG TABS tablet Take 1 tablet (10 mg total) by mouth daily.   EPINEPHrine 0.3 mg/0.3 mL IJ SOAJ injection Inject 0.3 mg into the muscle as needed for anaphylaxis.   erythromycin with ethanol (EMGEL) 2 % gel Apply daily to affected areas   famotidine (PEPCID) 40 MG tablet Take 1 tablet (40 mg total) by mouth 2 (two) times daily. Take after breakfast meal and take  after dinner meal   losartan (COZAAR) 100 MG tablet Take 1 tablet (100 mg total) by mouth at bedtime.   Multiple Vitamin (MULTIVITAMIN) capsule Take 1 capsule by mouth daily.   Na Sulfate-K Sulfate-Mg Sulf (SUPREP BOWEL PREP KIT) 17.5-3.13-1.6 GM/177ML SOLN Take 1 kit by mouth as directed.   tretinoin (RETIN-A) 0.05 % cream Apply topically at bedtime. USE EVERY OTHER NIGHT FOR 1 MONTH, AFTER ONE MONTH INCREASE USE TO NIGHTLY   [DISCONTINUED] esomeprazole (NEXIUM) 40 MG capsule Take 1 capsule (40 mg total) by mouth daily.   cetirizine (ZYRTEC ALLERGY) 10 MG tablet Take 1 tablet (10 mg total) by mouth daily. (Patient not taking: Reported on 01/01/2023)   esomeprazole (NEXIUM) 40 MG capsule Take 1 capsule (40 mg total) by mouth 2 (two) times daily before a meal.   No facility-administered encounter medications on file as of 01/01/2023.   Allergies  Allergen Reactions   Ibuprofen Other (See Comments)    Drops platelets/ thrombocytopenia   Patient Active Problem List   Diagnosis Date Noted   Headache 07/23/2022   Cervical disc disorder with radiculopathy of cervical region 06/20/2020   Seasonal allergic rhinitis 03/05/2020   Chronic left shoulder pain 11/27/2019   GERD (gastroesophageal reflux disease) 08/10/2019   Hypertension 08/10/2019   Insomnia 06/09/2019   PICC (peripherally inserted central catheter) in place 09/05/2018   Mixed hyperlipidemia 02/28/2018   Type 2 diabetes mellitus without complications (HCC) 02/25/2018   Tobacco use disorder 04/13/2017   T.T.P. syndrome (HCC) 02/06/2017   Renal insufficiency 02/03/2017   Thrombocytopenia (HCC) 02/01/2017   Diabetes mellitus (HCC) 02/01/2017   Diverticulosis 02/01/2017   Social History   Socioeconomic History   Marital status: Single    Spouse name: Not on file   Number of children: 2   Years of education: Not on file   Highest education level: Not on file  Occupational History   Occupation: Location manager   Occupation:  unemployed  Tobacco Use   Smoking status: Some Days    Current packs/day: 0.50    Average packs/day: 0.5 packs/day for 15.0 years (7.5 ttl pk-yrs)    Types: Cigarettes    Passive exposure: Current   Smokeless tobacco: Never  Vaping Use   Vaping status: Never Used  Substance and Sexual Activity   Alcohol use: Not Currently    Comment: 3-4 beers per day most days   Drug use: No   Sexual activity: Yes  Other Topics Concern   Not on file  Social History Narrative   ** Merged History Encounter **       Social Determinants of Health   Financial Resource Strain: Not on file  Food Insecurity: Not on file  Transportation Needs: Not on file  Physical Activity: Not on file  Stress: Not on file  Social Connections: Unknown (04/22/2022)  [facility]   Social Network    Social Network: Not on file  Intimate Partner Violence: Unknown (04/22/2022)   Received from [facility], [facility]   HITS    Physically Hurt: Not on file    Insult or Talk Down To: Not on file    Threaten Physical Harm: Not on file    Scream or Curse: Not on file    Eric Hooper's family history includes Colon cancer in his maternal aunt; Diabetes type II in his father; Hyperlipidemia in his mother; Hypertension in his mother.      Objective:    Vitals:   01/01/23 0857  BP: 124/70  Pulse: 82    Physical Exam Well-developed well-nourished African-American male in no acute distress.  Pleasant  Weight, 193 BMI 27.6  HEENT; nontraumatic normocephalic, EOMI, PE R LA, sclera anicteric. Oropharynx; not examined today Neck; supple, no JVD Cardiovascular; regular rate and rhythm with S1-S2, no murmur rub or gallop Pulmonary; Clear bilaterally Abdomen; soft, nontender, nondistended, no palpable mass or hepatosplenomegaly, bowel sounds are active Rectal; not done today Skin; benign exam, no jaundice rash or appreciable lesions Extremities; no clubbing cyanosis or edema skin  warm and dry Neuro/Psych; alert and oriented x4, grossly nonfocal mood and affect appropriate        Assessment & Plan:   #51 47 year old male with history of chronic GERD, presenting with refractory symptoms over the past couple of months despite Nexium 40 mg p.o. twice daily. Primary symptoms currently are a fullness and heaviness in the esophageal area and sense of both food and liquid sitting in the esophagus prior to traversing, no regurgitation.  Weight is stable.  Etiology of his current symptoms is not entirely clear, he may have a component of esophagitis though is on high-dose PPI, wonder if this may be more motility related.  #2 history of adenomatous and sessile serrated polyps, last colonoscopy 2017/[place]-due for follow-up surveillance  #3 adult onset diabetes mellitus 4.  Diverticulosis 5.  Hypertension 6.  TTP-followed by hematology/labs earlier this week with normal CBC, no recent active symptoms or requirement for steroids.  Plan Patient will be scheduled for EGD, possible dilation and colonoscopy with Dr. Myrtie Neither.  Both procedures were described in detail with the patient including indications risks and benefits and he is agreeable to proceed. For now we will continue Nexium 40 mg p.o. twice daily AC breakfast and AC dinner Add Pepcid 40 mg p.o. every morning and every afternoon Strict antireflux regimen and antireflux diet Will hold on changing PPI until we see what EGD shows.  May need manometry/pH testing if EGD unremarkable.  Eric Hooper S Miloh Alcocer PA-C 01/03/2023   Cc: Fleet Contras, MD

## 2023-01-05 ENCOUNTER — Telehealth: Payer: Self-pay

## 2023-01-05 NOTE — Telephone Encounter (Signed)
Patient gave verbal understanding and had no further questions or concerns  

## 2023-01-05 NOTE — Telephone Encounter (Signed)
-----   Message from Lonna Cobb sent at 01/04/2023  4:16 PM EDT ----- Please let him know Eric Hooper is slightly lower.  Follow-up as scheduled.

## 2023-01-07 NOTE — Progress Notes (Signed)
____________________________________________________________  Attending physician addendum:  Thank you for sending this case to me. I have reviewed the entire note and agree with the plan.  Agree that if his endoscopy is unrevealing, manometry seems appropriate to evaluate for achalasia or other motility disorder.  Amada Jupiter, MD  ____________________________________________________________

## 2023-01-11 ENCOUNTER — Ambulatory Visit (HOSPITAL_COMMUNITY)
Admission: EM | Admit: 2023-01-11 | Discharge: 2023-01-11 | Disposition: A | Discharge status: Home / Self Care | Payer: Medicaid Other | Attending: Internal Medicine | Admitting: Internal Medicine

## 2023-01-11 ENCOUNTER — Encounter (HOSPITAL_COMMUNITY): Payer: Self-pay

## 2023-01-11 DIAGNOSIS — J069 Acute upper respiratory infection, unspecified: Secondary | ICD-10-CM | POA: Diagnosis present

## 2023-01-11 DIAGNOSIS — Z1152 Encounter for screening for COVID-19: Secondary | ICD-10-CM | POA: Diagnosis not present

## 2023-01-11 DIAGNOSIS — R519 Headache, unspecified: Secondary | ICD-10-CM | POA: Diagnosis present

## 2023-01-11 DIAGNOSIS — J029 Acute pharyngitis, unspecified: Secondary | ICD-10-CM | POA: Diagnosis present

## 2023-01-11 LAB — POCT RAPID STREP A (OFFICE): Rapid Strep A Screen: NEGATIVE

## 2023-01-11 NOTE — ED Triage Notes (Signed)
Pt reports sore throat and itchy throat x 80month. Pt reports a headache that started 2-3 days. Pt believes he has mold in his home.

## 2023-01-11 NOTE — ED Provider Notes (Signed)
[facility]    CSN: [id number] Arrival date & time: 01/11/23  1143      History   Chief Complaint Chief Complaint  Patient presents with   Sore Throat   Cough   Nausea    HPI Egbert Quijas is a 47 y.o. male.   Patient here today for evaluation of itchy and sore throat that he has had for the last month.  He reports not significant pain but occasionally will have discomfort.  He also notes he has had a headache the last few days.  He denies any fever.  He does not feel he has COVID but is agreeable to screening.  He is concerned he may have mold in his home as he states he smells this through the vents.  He does not report any vomiting or diarrhea.  He does not report treatment for symptoms.  The history is provided by the patient.  Sore Throat Associated symptoms include headaches. Pertinent negatives include no abdominal pain and no shortness of breath.  Cough Associated symptoms: headaches and sore throat   Associated symptoms: no chills, no ear pain, no eye discharge, no fever and no shortness of breath     Past Medical History:  Diagnosis Date   At risk for dysfunction of heart 07/11/2018   Chest wall pain 08/14/2018   Controlled diabetes mellitus type 2 with complications (HCC) 02/25/2018   Diabetes mellitus without complication (HCC)    non-insulin dependent   Diverticulosis 02/01/2017   on CT scan abd/pelvis   GERD (gastroesophageal reflux disease)    Hypertension    Kidney stones 02/03/2017   Leg swelling 05/12/2017   Macrocytic anemia 02/01/2017   Microscopic hematuria 02/01/2017   Mixed hyperlipidemia 02/28/2018   Phlebitis 05/12/2017   Renal insufficiency 02/03/2017   Smoker 04/13/2017    Patient Active Problem List   Diagnosis Date Noted   Headache 07/23/2022   Cervical disc disorder with radiculopathy of cervical region 06/20/2020   Seasonal allergic rhinitis 03/05/2020   Chronic left shoulder pain 11/27/2019   GERD (gastroesophageal  reflux disease) 08/10/2019   Hypertension 08/10/2019   Insomnia 06/09/2019   PICC (peripherally inserted central catheter) in place 09/05/2018   Mixed hyperlipidemia 02/28/2018   Type 2 diabetes mellitus without complications (HCC) 02/25/2018   Tobacco use disorder 04/13/2017   T.T.P. syndrome (HCC) 02/06/2017   Renal insufficiency 02/03/2017   Thrombocytopenia (HCC) 02/01/2017   Diabetes mellitus (HCC) 02/01/2017   Diverticulosis 02/01/2017    Past Surgical History:  Procedure Laterality Date   ELBOW SURGERY Right    IR FLUORO GUIDE CV LINE RIGHT  02/02/2017   IR FLUORO GUIDE CV LINE RIGHT  11/23/2021   IR FLUORO GUIDE CV LINE RIGHT  11/28/2021   IR REMOVAL TUN CV CATH W/O FL  02/26/2022   IR US GUIDE VASC ACCESS RIGHT  02/02/2017   IR US GUIDE VASC ACCESS RIGHT  11/23/2021       Home Medications    Prior to Admission medications   Medication Sig Start Date End Date Taking? Authorizing Provider  acetaminophen (TYLENOL) 325 MG tablet Take 2 tablets (650 mg total) by mouth every 4 (four) hours as needed (discomfort or fever). 11/29/21  Yes Jerre Simon, MD  albuterol (VENTOLIN HFA) 108 (90 Base) MCG/ACT inhaler INHALE TWO PUFFS INTO THE LUNGS EVERY 6 HOURS AS NEEDED. 05/21/22  Yes Lilland, Alana, DO  amLODipine (NORVASC) 10 MG tablet Take 1 tablet (10 mg total) by mouth at bedtime. 11/09/22  Yes Caro Laroche, DO  aspirin EC 81 MG tablet Take 1 tablet (81 mg total) by mouth daily. Swallow whole. 11/26/22  Yes Maisie Fus, MD  atorvastatin (LIPITOR) 40 MG tablet TAKE 1 TABLET BY MOUTH EVERY DAY 09/14/22  Yes Lilland, Alana, DO  cetirizine (ZYRTEC ALLERGY) 10 MG tablet Take 1 tablet (10 mg total) by mouth daily. 08/13/22  Yes Carlisle Beers, FNP  empagliflozin (JARDIANCE) 10 MG TABS tablet Take 1 tablet (10 mg total) by mouth daily. 06/09/22  Yes Lilland, Alana, DO  esomeprazole (NEXIUM) 40 MG capsule Take 1 capsule (40 mg total) by mouth 2 (two) times daily before a meal. 01/01/23   Yes Esterwood, Amy S, PA-C  losartan (COZAAR) 100 MG tablet Take 1 tablet (100 mg total) by mouth at bedtime. 11/09/22  Yes Caro Laroche, DO  benzoyl peroxide 5 % gel Apply topically 2 (two) times daily. 02/23/22   Lilland, Alana, DO  blood glucose meter kit and supplies KIT Dispense based on patient and insurance preference. Use up to four times daily as directed. (FOR ICD-9 250.00, 250.01). 08/25/18   Garnette Gunner, MD  blood glucose meter kit and supplies Dispense based on patient and insurance preference. Use up to four times daily as directed. (FOR ICD-10 E10.9, E11.9). 09/02/18   Garnette Gunner, MD  diclofenac Sodium (VOLTAREN) 1 % GEL Apply 2 g topically 4 (four) times daily. 09/23/22   Garrison, [place] N, FNP  doxycycline (VIBRA-TABS) 100 MG tablet Take 1 tablet (100 mg total) by mouth 2 (two) times daily. With food and plenty of fluid x 5 days as needed for flares 12/31/22 04/30/23  Terri Piedra, DO  EPINEPHrine 0.3 mg/0.3 mL IJ SOAJ injection Inject 0.3 mg into the muscle as needed for anaphylaxis. 08/13/22   Carlisle Beers, FNP  erythromycin with ethanol (EMGEL) 2 % gel Apply daily to affected areas 10/08/22   Terri Piedra, DO  famotidine (PEPCID) 40 MG tablet Take 1 tablet (40 mg total) by mouth 2 (two) times daily. Take after breakfast meal and take after dinner meal 01/01/23   Esterwood, Amy S, PA-C  Multiple Vitamin (MULTIVITAMIN) capsule Take 1 capsule by mouth daily.    [provider]  Na Sulfate-K Sulfate-Mg Sulf (SUPREP BOWEL PREP KIT) 17.5-3.13-1.6 GM/177ML SOLN Take 1 kit by mouth as directed. 01/01/23   Esterwood, Amy S, PA-C  tretinoin (RETIN-A) 0.05 % cream Apply topically at bedtime. USE EVERY OTHER NIGHT FOR 1 MONTH, AFTER ONE MONTH INCREASE USE TO NIGHTLY 10/08/22 10/08/23  Terri Piedra, DO    Family History Family History  Problem Relation Age of Onset   Hypertension Mother    Hyperlipidemia Mother    Diabetes type II Father    Colon cancer  Maternal Aunt    Stomach cancer Neg Hx    Esophageal cancer Neg Hx     Social History Social History   Tobacco Use   Smoking status: Some Days    Current packs/day: 0.50    Average packs/day: 0.5 packs/day for 15.0 years (7.5 ttl pk-yrs)    Types: Cigarettes    Passive exposure: Current   Smokeless tobacco: Never  Vaping Use   Vaping status: Never Used  Substance Use Topics   Alcohol use: Not Currently    Comment: 3-4 beers per day most days   Drug use: No     Allergies   Ibuprofen   Review of Systems Review of Systems  Constitutional:  Negative for chills and fever.  HENT:  Positive for sore throat. Negative for congestion and ear pain.   Eyes:  Negative for discharge and redness.  Respiratory:  Negative for cough and shortness of breath.   Gastrointestinal:  Negative for abdominal pain, diarrhea, nausea and vomiting.  Neurological:  Positive for headaches.     Physical Exam Triage Vital Signs ED Triage Vitals [01/11/23 1300]  Encounter Vitals Group     BP (!) 147/100     Systolic BP Percentile      Diastolic BP Percentile      Pulse Rate 80     Resp 16     Temp 97.9 F (36.6 C)     Temp Source Oral     SpO2 96 %     Weight      Height      Head Circumference      Peak Flow      Pain Score      Pain Loc      Pain Education      Exclude from Growth Chart    No data found.  Updated Vital Signs BP (!) 147/100 (BP Location: Left Arm)   Pulse 80   Temp 97.9 F (36.6 C) (Oral)   Resp 16   SpO2 96%      Physical Exam Vitals and nursing note reviewed.  Constitutional:      General: He is not in acute distress.    Appearance: Normal appearance. He is not ill-appearing.  HENT:     Head: Normocephalic and atraumatic.     Nose: Nose normal. No congestion.     Mouth/Throat:     Mouth: Mucous membranes are moist.     Pharynx: Oropharynx is clear. No oropharyngeal exudate or posterior oropharyngeal erythema.  Eyes:     Conjunctiva/sclera:  Conjunctivae normal.  Cardiovascular:     Rate and Rhythm: Normal rate and regular rhythm.     Heart sounds: Normal heart sounds. No murmur heard. Pulmonary:     Effort: Pulmonary effort is normal. No respiratory distress.     Breath sounds: Normal breath sounds. No wheezing, rhonchi or rales.  Skin:    General: Skin is warm and dry.  Neurological:     Mental Status: He is alert.  Psychiatric:        Mood and Affect: Mood normal.        Thought Content: Thought content normal.      UC Treatments / Results  Labs (all labs ordered are listed, but only abnormal results are displayed) Labs Reviewed  SARS CORONAVIRUS 2 (TAT 6-24 HRS)  POCT RAPID STREP A (OFFICE)    EKG   Radiology No results found.  Procedures Procedures (including critical care time)  Medications Ordered in UC Medications - No data to display  Initial Impression / Assessment and Plan / UC Course  I have reviewed the triage vital signs and the nursing notes.  Pertinent labs & imaging results that were available during my care of the patient were reviewed by me and considered in my medical decision making (see chart for details).    Suspect likely viral etiology of upper respiratory symptoms but cannot rule out mold exposure as patient is concerned with same.  Recommend home be evaluated for same.  Rapid strep screening negative and COVID screening ordered.  Encouraged follow-up with any further concerns.  Final Clinical Impressions(s) / UC Diagnoses   Final diagnoses:  Viral upper respiratory tract infection  Discharge Instructions   None    ED Prescriptions   None    PDMP not reviewed this encounter.   Tomi Bamberger, PA-C 01/11/23 1520

## 2023-01-11 NOTE — ED Notes (Signed)
At discharge, patient repeatedly spoke of mold in his home.  Patient states he did not come for a covid test.  Patient was told we do not do testing for mold.  We addressed his symptoms and will determine next steps in treatment once confirmed covid results.  Encouraged follow up with pcp.  Patient's concerns were brought to provider prior to discharge

## 2023-01-12 ENCOUNTER — Telehealth: Payer: Self-pay | Admitting: *Deleted

## 2023-01-12 LAB — SARS CORONAVIRUS 2 (TAT 6-24 HRS): SARS Coronavirus 2: NEGATIVE

## 2023-01-12 NOTE — Telephone Encounter (Addendum)
Eric Hooper left message regarding 9/4 [name] being low and asking what needs to be done? MD made aware. Per Dr. Truett Perna, not of any concern, especially since his LDH, platelets and Hgb are OK. Will be fine to wait till 12/06 for repeat unless he is worried and it can be done sooner. He feels OK to wait. Made nurse aware that he has been having sinus pain and congestion. Went to urgent care and tested negative for covid and was told to take OTC sinus decongestant.

## 2023-01-19 ENCOUNTER — Encounter: Payer: Self-pay | Admitting: Dermatology

## 2023-01-19 ENCOUNTER — Ambulatory Visit (INDEPENDENT_AMBULATORY_CARE_PROVIDER_SITE_OTHER): Payer: Medicaid Other | Admitting: Dermatology

## 2023-01-19 VITALS — BP 143/83 | HR 81

## 2023-01-19 DIAGNOSIS — D492 Neoplasm of unspecified behavior of bone, soft tissue, and skin: Secondary | ICD-10-CM

## 2023-01-19 NOTE — Patient Instructions (Signed)

## 2023-01-19 NOTE — Progress Notes (Signed)
   New Patient Visit   Subjective  Eric Hooper is a 47 y.o. male who presents for the following: consult  Pt state he has a cyst on his forehead for a few years. It itches on occasion and the size fluctuates. He states it is bothersome and he would like to discuss removal.  The following portions of the chart were reviewed this encounter and updated as appropriate: medications, allergies, medical history  Review of Systems:  No other skin or systemic complaints except as noted in HPI or Assessment and Plan.  Objective  Well appearing patient in no apparent distress; mood and affect are within normal limits.   A focused examination was performed of the following areas: face  Relevant exam findings are noted in the Assessment and Plan.   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 Exam most consistent with an epidermal inclusion cyst. Discussed that a cyst is a benign growth that can grow over time and sometimes get irritated or inflamed. Recommend observation if it is not bothersome. Discussed option of surgical excision to remove it if it is growing, symptomatic, or other changes noted. Please call for new or changing lesions so they can be evaluated. Discussed risks and benefits of surgical removal. Patient would like to proceed with excision. He will be scheduled.    Return for SURGERY.  I, Tillie Fantasia, CMA, am acting as scribe for Gwenith Daily, MD.   Documentation: I have reviewed the above documentation for accuracy and completeness, and I agree with the above.  Gwenith Daily, MD

## 2023-01-21 ENCOUNTER — Encounter: Payer: Self-pay | Admitting: Gastroenterology

## 2023-01-21 ENCOUNTER — Ambulatory Visit: Payer: Medicaid Other | Admitting: Gastroenterology

## 2023-01-21 VITALS — BP 108/71 | HR 69 | Temp 97.7°F | Resp 10 | Ht 70.0 in | Wt 193.0 lb

## 2023-01-21 DIAGNOSIS — K219 Gastro-esophageal reflux disease without esophagitis: Secondary | ICD-10-CM

## 2023-01-21 DIAGNOSIS — D126 Benign neoplasm of colon, unspecified: Secondary | ICD-10-CM

## 2023-01-21 DIAGNOSIS — Z86010 Personal history of colonic polyps: Secondary | ICD-10-CM

## 2023-01-21 DIAGNOSIS — Z09 Encounter for follow-up examination after completed treatment for conditions other than malignant neoplasm: Secondary | ICD-10-CM

## 2023-01-21 DIAGNOSIS — R1319 Other dysphagia: Secondary | ICD-10-CM | POA: Diagnosis not present

## 2023-01-21 DIAGNOSIS — D123 Benign neoplasm of transverse colon: Secondary | ICD-10-CM

## 2023-01-21 MED ORDER — SODIUM CHLORIDE 0.9 % IV SOLN: 500.0000 mL | Freq: Once | INTRAVENOUS | Status: DC

## 2023-01-21 NOTE — Progress Notes (Signed)
Pt's states no medical or surgical changes since previsit or office visit. 

## 2023-01-21 NOTE — Patient Instructions (Signed)
Thank you for letting us take care of your healthcare needs today. Please see handouts on Antireflux Regimen and Polyps.    YOU HAD AN ENDOSCOPIC PROCEDURE TODAY AT THE [facility]:   Refer to the procedure report that was given to you for any specific questions about what was found during the examination.  If the procedure report does not answer your questions, please call your gastroenterologist to clarify.  If you requested that your care partner not be given the details of your procedure findings, then the procedure report has been included in a sealed envelope for you to review at your convenience later.  YOU SHOULD EXPECT: Some feelings of bloating in the abdomen. Passage of more gas than usual.  Walking can help get rid of the air that was put into your GI tract during the procedure and reduce the bloating. If you had a lower endoscopy (such as a colonoscopy or flexible sigmoidoscopy) you may notice spotting of blood in your stool or on the toilet paper. If you underwent a bowel prep for your procedure, you may not have a normal bowel movement for a few days.  Please Note:  You might notice some irritation and congestion in your nose or some drainage.  This is from the oxygen used during your procedure.  There is no need for concern and it should clear up in a day or so.  SYMPTOMS TO REPORT IMMEDIATELY:  Following lower endoscopy (colonoscopy or flexible sigmoidoscopy):  Excessive amounts of blood in the stool  Significant tenderness or worsening of abdominal pains  Swelling of the abdomen that is new, acute  Fever of 100F or higher  Following upper endoscopy (EGD)  Vomiting of blood or coffee ground material  New chest pain or pain under the shoulder blades  Painful or persistently difficult swallowing  New shortness of breath  Fever of 100F or higher  Black, tarry-looking stools  For urgent or emergent issues, a gastroenterologist can be reached at any hour by  calling [phone]. Do not use MyChart messaging for urgent concerns.    DIET:  We do recommend a small meal at first, but then you may proceed to your regular diet.  Drink plenty of fluids but you should avoid alcoholic beverages for 24 hours.  ACTIVITY:  You should plan to take it easy for the rest of today and you should NOT DRIVE or use heavy machinery until tomorrow (because of the sedation medicines used during the test).    FOLLOW UP: Our staff will call the number listed on your records the next business day following your procedure.  We will call around 7:15- 8:00 am to check on you and address any questions or concerns that you may have regarding the information given to you following your procedure. If we do not reach you, we will leave a message.     If any biopsies were taken you will be contacted by phone or by letter within the next 1-3 weeks.  Please call us at [phone] if you have not heard about the biopsies in 3 weeks.    SIGNATURES/CONFIDENTIALITY: You and/or your care partner have signed paperwork which will be entered into your electronic medical record.  These signatures attest to the fact that that the information above on your After Visit Summary has been reviewed and is understood.  Full responsibility of the confidentiality of this discharge information lies with you and/or your care-partner.

## 2023-01-21 NOTE — Op Note (Signed)
[facility] Patient Name: Eric Hooper Procedure Date: 01/21/2023 1:38 PM MRN: [id number] Endoscopist: Sherilyn Cooter L. Myrtie Neither , MD, [id number] Age: 47 Referring MD:  Date of Birth: August 22, 1975 Gender: Male Account #: [id number] Procedure:                Colonoscopy Indications:              High risk colon polyp surveillance: Personal                            history of colonic polyps                           6 and 10 mm adenomatous and serrated polyps                            [place] Medicines:                Monitored Anesthesia Care Procedure:                Pre-Anesthesia Assessment:                           - Prior to the procedure, a History and Physical                            was performed, and patient medications and                            allergies were reviewed. The patient's tolerance of                            previous anesthesia was also reviewed. The risks                            and benefits of the procedure and the sedation                            options and risks were discussed with the patient.                            All questions were answered, and informed consent                            was obtained. Prior Anticoagulants: The patient has                            taken no anticoagulant or antiplatelet agents. ASA                            Grade Assessment: II - A patient with mild systemic                            disease. After reviewing the risks and benefits,  the patient was deemed in satisfactory condition to                            undergo the procedure.                           After obtaining informed consent, the colonoscope                            was passed under direct vision. Throughout the                            procedure, the patient's blood pressure, pulse, and                            oxygen saturations were monitored continuously. The                             Olympus Scope [id number] was introduced through the                            anus and advanced to the the cecum, identified by                            appendiceal orifice and ileocecal valve. The                            colonoscopy was somewhat difficult due to a                            redundant colon. Successful completion of the                            procedure was aided by using manual pressure and                            straightening and shortening the scope to obtain                            bowel loop reduction. The patient tolerated the                            procedure well. The quality of the bowel                            preparation was good (some lavage required for                            residual prep debris). The ileocecal valve,                            appendiceal orifice, and rectum were photographed. Scope In: 1:56:59 PM Scope Out: 2:20:19 PM Scope Withdrawal Time: 0 hours 19 minutes 37 seconds  Total Procedure Duration:  0 hours 23 minutes 20 seconds  Findings:                 The perianal and digital rectal examinations were                            normal.                           Repeat examination of right colon under NBI                            performed.                           A 10 mm polyp was found in the transverse colon.                            The polyp was semi-pedunculated. The polyp was                            removed with a hot snare. Resection and retrieval                            were complete. (Removed en bloc, then cut cold                            snare retrieval) Additional STSC applied to                            polypectomy edges.                           The exam was otherwise without abnormality on                            direct and retroflexion views. Complications:            No immediate complications. Estimated Blood Loss:     Estimated blood loss was minimal. Impression:                - One 10 mm polyp in the transverse colon, removed                            with a hot snare. Resected and retrieved.                           - The examination was otherwise normal on direct                            and retroflexion views. Recommendation:           - Patient has a contact number available for                            emergencies. The signs and symptoms of potential  delayed complications were discussed with the                            patient. Return to normal activities tomorrow.                            Written discharge instructions were provided to the                            patient.                           - Resume previous diet.                           - Continue present medications.                           - Await pathology results.                           - Repeat colonoscopy is recommended for                            surveillance. The colonoscopy date will be                            determined after pathology results from today's                            exam become available for review. Eric Hooper L. Myrtie Neither, MD 01/21/2023 2:33:57 PM This report has been signed electronically.

## 2023-01-21 NOTE — Progress Notes (Signed)
Called to room to assist during endoscopic procedure.  Patient ID and intended procedure confirmed with present staff. Received instructions for my participation in the procedure from the performing physician.  

## 2023-01-21 NOTE — Progress Notes (Signed)
No changes to clinical history since GI office visit on 01/01/23. No significant changes were identified.  The patient continues to be an appropriate candidate for the planned procedure and anesthesia.   The patient is appropriate for an endoscopic procedure in the ambulatory setting.  - Amada Jupiter, MD

## 2023-01-21 NOTE — Op Note (Signed)
[facility] Patient Name: Eric Hooper Procedure Date: 01/21/2023 1:41 PM MRN: [id number] Endoscopist: Sherilyn Cooter L. Myrtie Neither , MD, [id number] Age: 47 Referring MD:  Date of Birth: 10-26-1975 Gender: Male Account #: [id number] Procedure:                Upper GI endoscopy Indications:              Esophageal dysphagia, Esophageal reflux symptoms                            that persist despite appropriate therapy (twice                            daily PPI. Symptoms progressing in recent months) Medicines:                Monitored Anesthesia Care Procedure:                Pre-Anesthesia Assessment:                           - Prior to the procedure, a History and Physical                            was performed, and patient medications and                            allergies were reviewed. The patient's tolerance of                            previous anesthesia was also reviewed. The risks                            and benefits of the procedure and the sedation                            options and risks were discussed with the patient.                            All questions were answered, and informed consent                            was obtained. Prior Anticoagulants: The patient has                            taken no anticoagulant or antiplatelet agents. ASA                            Grade Assessment: II - A patient with mild systemic                            disease. After reviewing the risks and benefits,                            the patient was deemed in satisfactory condition to  undergo the procedure.                           After obtaining informed consent, the endoscope was                            passed under direct vision. Throughout the                            procedure, the patient's blood pressure, pulse, and                            oxygen saturations were monitored continuously. The                            Olympus  Scope [id number] was introduced through the                            mouth, and advanced to the second part of duodenum.                            The upper GI endoscopy was accomplished without                            difficulty. The patient tolerated the procedure                            well. Scope In: Scope Out: Findings:                 The larynx was normal.                           Normal mucosa was found in the entire esophagus.                            Several biopsies were obtained in the middle third                            of the esophagus and in the lower third of the                            esophagus with cold forceps for histology. (Rule                            out EOE)                           No dilatation, stricture, mass or resistance                            passing scope through the entirety of the esophagus.                           The stomach was normal.  The cardia and gastric fundus were normal on                            retroflexion. (Hill grade 1)                           The examined duodenum was normal. Complications:            No immediate complications. Estimated Blood Loss:     Estimated blood loss was minimal. Impression:               - Normal larynx.                           - Normal mucosa was found in the entire esophagus.                           - Normal stomach.                           - Normal examined duodenum.                           - Several biopsies were obtained in the middle                            third of the esophagus and in the lower third of                            the esophagus. Recommendation:           - Patient has a contact number available for                            emergencies. The signs and symptoms of potential                            delayed complications were discussed with the                            patient. Return to normal activities tomorrow.                             Written discharge instructions were provided to the                            patient.                           - Resume previous diet.                           - Continue present medications.                           - Follow an antireflux regimen indefinitely. Make  every effort to follow these guidelines closely, as                            they are just as important as medicines to help                            control reflux symptoms.                           - After biopsy results, discuss further testing                            with esophageal pH/impedance and manometry testing. Mija Effertz L. Myrtie Neither, MD 01/21/2023 2:28:30 PM This report has been signed electronically.

## 2023-01-21 NOTE — Progress Notes (Signed)
Patient EKG showed quite a few PVCs throughout procedure, but VSS.  Uneventful anesthetic. Report to pacu rn. Vss. Care resumed by rn.

## 2023-01-22 ENCOUNTER — Telehealth: Payer: Self-pay | Admitting: *Deleted

## 2023-01-22 NOTE — Telephone Encounter (Signed)
  Follow up Call-     01/21/2023    1:17 PM 10/17/2020    9:23 AM  Call back number  Post procedure Call Back phone  # [phone]  Permission to leave phone message Yes      Patient questions:  Do you have a fever, pain , or abdominal swelling? No. Pain Score  0 *  Have you tolerated food without any problems? Yes.    Have you been able to return to your normal activities? Yes.    Do you have any questions about your discharge instructions: Diet   No. Medications  No. Follow up visit  No.  Do you have questions or concerns about your Care? No.  Actions: * If pain score is 4 or above: No action needed, pain <4.  Pt describes some cramping in the right side of his abdomen.  Discussed the possibility of gas still trapped.  Instructed him to move around and drink warm fluids.  He will call back if symptoms do not improve or worsen.

## 2023-01-26 LAB — SURGICAL PATHOLOGY

## 2023-01-27 ENCOUNTER — Encounter: Payer: Medicaid Other | Admitting: Dermatology

## 2023-02-08 ENCOUNTER — Other Ambulatory Visit: Payer: Self-pay | Admitting: Nurse Practitioner

## 2023-02-08 ENCOUNTER — Other Ambulatory Visit: Payer: Self-pay | Admitting: Oncology

## 2023-02-08 DIAGNOSIS — M3119 Other thrombotic microangiopathy: Secondary | ICD-10-CM

## 2023-02-08 NOTE — Telephone Encounter (Signed)
Needs to be approved by PCP

## 2023-02-15 ENCOUNTER — Ambulatory Visit: Payer: Medicaid Other | Admitting: Dermatology

## 2023-02-15 ENCOUNTER — Encounter: Payer: Self-pay | Admitting: Dermatology

## 2023-02-15 VITALS — BP 132/87 | HR 84

## 2023-02-15 DIAGNOSIS — D170 Benign lipomatous neoplasm of skin and subcutaneous tissue of head, face and neck: Secondary | ICD-10-CM

## 2023-02-15 DIAGNOSIS — D492 Neoplasm of unspecified behavior of bone, soft tissue, and skin: Secondary | ICD-10-CM

## 2023-02-15 DIAGNOSIS — D485 Neoplasm of uncertain behavior of skin: Secondary | ICD-10-CM

## 2023-02-15 NOTE — Patient Instructions (Signed)

## 2023-02-15 NOTE — Progress Notes (Addendum)
Follow-Up Visit   Subjective  Eric Hooper is a 47 y.o. male who presents for the following: Excision of Neoplasm of Skin on right forehead  The following portions of the chart were reviewed this encounter and updated as appropriate: medications, allergies, medical history  Review of Systems:  No other skin or systemic complaints except as noted in HPI or Assessment and Plan.  Objective  Well appearing patient in no apparent distress; mood and affect are within normal limits.  A focused examination was performed of the following areas: Right forehead Relevant physical exam findings are noted in the Assessment and Plan.   right forehead         Assessment & Plan   Neoplasm of uncertain behavior of skin right forehead  Skin excision  Lesion length (cm):  2 Lesion width (cm):  2.4 Margin per side (cm):  0.1 Total excision diameter (cm):  2.6 Informed consent: discussed and consent obtained   Timeout: patient name, date of birth, surgical site, and procedure verified   Procedure prep:  Patient was prepped and draped in usual sterile fashion Prep type:  Chlorhexidine Anesthesia: the lesion was anesthetized in a standard fashion   Anesthetic:  1% lidocaine w/ epinephrine 1-100,000 local infiltration Instrument used: #15 blade   Hemostasis achieved with: suture and electrodesiccation   Hemostasis achieved with comment:  5.0 monocryl, 5.0 fast absorbing Outcome: patient tolerated procedure well with no complications   Post-procedure details: sterile dressing applied and wound care instructions given    Skin repair Complexity:  Complex Final length (cm):  3.2 Informed consent: discussed and consent obtained   Timeout: patient name, date of birth, surgical site, and procedure verified   Procedure prep:  Patient was prepped and draped in usual sterile fashion Prep type:  Chlorhexidine Anesthesia: the lesion was anesthetized in a standard fashion   Reason for type of  repair: reduce subcutaneous dead space and avoid a hematoma, allow closure of the large defect and preserve normal anatomy   Undermining: area extensively undermined   Subcutaneous layers (deep stitches):  Suture size:  5-0 Suture type: Monocryl (poliglecaprone 25)   Stitches:  Buried vertical mattress Fine/surface layer approximation (top stitches):  Suture size:  6-0 Suture type: fast-absorbing plain gut   Stitches: simple running   Hemostasis achieved with: suture and electrodesiccation Outcome: patient tolerated procedure well with no complications   Post-procedure details: sterile dressing applied and wound care instructions given   Dressing type: pressure dressing and bandage    Specimen 1 - Surgical pathology Differential Diagnosis: R/O Lipoma vs other soft tissue mass  Check Margins: No   Complex Repair   The surgical wound was then cleaned, prepped, and re-anesthetized as above. Wound edges were undermined extensively along at least one entire edge and at a distance equal to or greater than the width of the defect (see wound defect size above) in order to achieve closure and decrease wound tension and anatomic distortion. Redundant tissue repair including standing cone removal was performed. Hemostasis was achieved with electrocautery. Subcutaneous and epidermal tissues were approximated with the above sutures. The surgical site was then lightly scrubbed with sterile, saline-soaked gauze. The area was then bandaged using Vaseline ointment, non-adherent gauze, gauze pads, and tape to provide an adequate pressure dressing. The patient tolerated the procedure well, was given detailed written and verbal wound care instructions, and was discharged in good condition.    Return if symptoms worsen or fail to improve.  Owens Shark, CMA,  am acting as scribe for Gwenith Daily, MD.   Documentation: I have reviewed the above documentation for accuracy and completeness, and I agree with  the above.  Gwenith Daily, MD

## 2023-02-17 LAB — SURGICAL PATHOLOGY

## 2023-02-18 ENCOUNTER — Encounter: Payer: Medicaid Other | Admitting: Dermatology

## 2023-03-04 ENCOUNTER — Ambulatory Visit: Payer: Medicaid Other | Admitting: Endocrinology

## 2023-03-04 ENCOUNTER — Encounter: Payer: Self-pay | Admitting: Endocrinology

## 2023-03-04 VITALS — BP 138/100 | HR 87 | Resp 20 | Ht 70.0 in | Wt 193.6 lb

## 2023-03-04 DIAGNOSIS — E119 Type 2 diabetes mellitus without complications: Secondary | ICD-10-CM

## 2023-03-04 DIAGNOSIS — Z7984 Long term (current) use of oral hypoglycemic drugs: Secondary | ICD-10-CM

## 2023-03-04 LAB — POCT GLYCOSYLATED HEMOGLOBIN (HGB A1C): Hemoglobin A1C: 6.1 % — AB (ref 4.0–5.6)

## 2023-03-04 MED ORDER — EMPAGLIFLOZIN 10 MG PO TABS: 10.0000 mg | ORAL_TABLET | Freq: Every day | ORAL | 3 refills | Status: DC

## 2023-03-04 NOTE — Progress Notes (Signed)
Outpatient Endocrinology Note Eric Laiken Sandy, MD  03/04/23  Patient's Name: Eric Hooper    DOB: 07/19/1975    MRN: [id number]                                                    REASON OF VISIT: New consult of type 2 diabetes mellitus  PCP: Fleet Contras, MD  HISTORY OF PRESENT ILLNESS:   Eric Hooper is a 47 y.o. old male with past medical history listed below, is here for new consult for type 2 diabetes mellitus.   Pertinent Diabetes History: _Diagnosed as Diabetes Mellitus type 2 in 2018.  Patient was initially treated with metformin.  Metformin was switched to SGLT2 inhibitor in February 2024.  Patient reports he was also having sickness with metformin.  Patient had taken glipizide and insulin therapy during steroid treatment for his TTP in the past.  He has a relatively controlled type 2 diabetes mellitus.  Patient is referred to endocrinology for evaluation and management of known type 2 diabetes mellitus.  History of DKA or diabetes related hospitalizations: none  Family h/o diabetes mellitus: father type 2 diabetes mellitus.    Chronic Diabetes Complications : Retinopathy: no. Last ophthalmology exam was done on annually, following with ophthalmology regularly.  Nephropathy: no, on ACE/ARB /losartan. Peripheral neuropathy: no Coronary artery disease: no Stroke: no  Relevant comorbidities and cardiovascular risk factors: Obesity: no Body mass index is 27.78 kg/m.  Hypertension: Yes  Hyperlipidemia : Yes, on statin   Current / Home Diabetic regimen includes:  Jardiance 10 mg daily.  Prior diabetic medications: Metformin nausea and vomiting.  Was switched to [place] in February 2024.  He had been on Farxiga in the past.  He had been on insulin therapy during his steroid treatment for his TTP.  Glycemic data:   He forgot to bring glucometer today.  He reports he has been checking blood sugar occasionally and morning blood sugar has been around 130  range.  Hypoglycemia: Patient has no hypoglycemic episodes. Patient has hypoglycemia awareness.  Factors modifying glucose control: 1.  Diabetic diet assessment: 3 meals a day.  2.  Staying active or exercising: Physically active at work.  3.  Medication compliance: compliant all of the time.  Interval history  Patient presented to establish endocrinology care for known type 2 diabetes mellitus.  He occasionally gets blurred vision.  Denies numbness and tingling of the feet.  He has been regularly following with ophthalmology.  No other complaints today.  REVIEW OF SYSTEMS As per history of present illness.   PAST MEDICAL HISTORY: Past Medical History:  Diagnosis Date   At risk for dysfunction of heart 07/11/2018   Chest wall pain 08/14/2018   Controlled diabetes mellitus type 2 with complications (HCC) 02/25/2018   Diabetes mellitus without complication (HCC)    non-insulin dependent   Diverticulosis 02/01/2017   on CT scan abd/pelvis   GERD (gastroesophageal reflux disease)    Hypertension    Kidney stones 02/03/2017   Leg swelling 05/12/2017   Macrocytic anemia 02/01/2017   Microscopic hematuria 02/01/2017   Mixed hyperlipidemia 02/28/2018   Phlebitis 05/12/2017   Renal insufficiency 02/03/2017   Smoker 04/13/2017    PAST SURGICAL HISTORY: Past Surgical History:  Procedure Laterality Date   COLONOSCOPY     ELBOW SURGERY Right    IR FLUORO  GUIDE CV LINE RIGHT  02/02/2017   IR FLUORO GUIDE CV LINE RIGHT  11/23/2021   IR FLUORO GUIDE CV LINE RIGHT  11/28/2021   IR REMOVAL TUN CV CATH W/O FL  02/26/2022   IR US GUIDE VASC ACCESS RIGHT  02/02/2017   IR US GUIDE VASC ACCESS RIGHT  11/23/2021   UPPER GASTROINTESTINAL ENDOSCOPY      ALLERGIES: Allergies  Allergen Reactions   Bee Venom Anaphylaxis   Ibuprofen Other (See Comments)    Drops platelets/ thrombocytopenia    FAMILY HISTORY:  Family History  Problem Relation Age of Onset   Hypertension Mother     Hyperlipidemia Mother    Diabetes type II Father    Colon cancer Maternal Aunt    Esophageal cancer Neg Hx    Rectal cancer Neg Hx    Stomach cancer Neg Hx     SOCIAL HISTORY: Social History   Socioeconomic History   Marital status: Single    Spouse name: Not on file   Number of children: 2   Years of education: Not on file   Highest education level: Not on file  Occupational History   Occupation: Location manager   Occupation: unemployed  Tobacco Use   Smoking status: Some Days    Current packs/day: 0.50    Average packs/day: 0.5 packs/day for 15.0 years (7.5 ttl pk-yrs)    Types: Cigarettes    Passive exposure: Current   Smokeless tobacco: Never  Vaping Use   Vaping status: Never Used  Substance and Sexual Activity   Alcohol use: Not Currently    Comment: 3-4 beers per day most days   Drug use: No   Sexual activity: Yes  Other Topics Concern   Not on file  Social History Narrative   ** Merged History Encounter **       Social Determinants of Health   Financial Resource Strain: Not on file  Food Insecurity: Not on file  Transportation Needs: Not on file  Physical Activity: Not on file  Stress: Not on file  Social Connections: Unknown (04/22/2022)   Received from [employer], [facility]   Social Network    Social Network: Not on file    MEDICATIONS:  Current Outpatient Medications  Medication Sig Dispense Refill   acetaminophen (TYLENOL) 325 MG tablet Take 2 tablets (650 mg total) by mouth every 4 (four) hours as needed (discomfort or fever). 20 tablet 0   albuterol (VENTOLIN HFA) 108 (90 Base) MCG/ACT inhaler INHALE TWO PUFFS INTO THE LUNGS EVERY 6 HOURS AS NEEDED. 8.5 each 3   amLODipine (NORVASC) 10 MG tablet Take 1 tablet (10 mg total) by mouth at bedtime. 90 tablet 3   aspirin EC 81 MG tablet Take 1 tablet (81 mg total) by mouth daily. Swallow whole.     atorvastatin (LIPITOR) 40 MG tablet TAKE 1 TABLET BY MOUTH EVERY DAY 90 tablet 1   benzoyl  peroxide 5 % gel Apply topically 2 (two) times daily. 45 g 0   blood glucose meter kit and supplies KIT Dispense based on patient and insurance preference. Use up to four times daily as directed. (FOR ICD-9 250.00, 250.01). 1 each 0   blood glucose meter kit and supplies Dispense based on patient and insurance preference. Use up to four times daily as directed. (FOR ICD-10 E10.9, E11.9). 1 each 0   doxycycline (VIBRA-TABS) 100 MG tablet Take 1 tablet (100 mg total) by mouth 2 (two) times daily. With food and plenty of  fluid x 5 days as needed for flares 30 tablet 1   EPINEPHrine 0.3 mg/0.3 mL IJ SOAJ injection Inject 0.3 mg into the muscle as needed for anaphylaxis. 1 each 0   erythromycin with ethanol (EMGEL) 2 % gel Apply daily to affected areas 60 g 2   esomeprazole (NEXIUM) 40 MG capsule Take 1 capsule (40 mg total) by mouth 2 (two) times daily before a meal. 60 capsule 6   famotidine (PEPCID) 40 MG tablet Take 1 tablet (40 mg total) by mouth 2 (two) times daily. Take after breakfast meal and take after dinner meal 60 tablet 6   losartan (COZAAR) 100 MG tablet Take 1 tablet (100 mg total) by mouth at bedtime. 90 tablet 3   Multiple Vitamin (MULTIVITAMIN) capsule Take 1 capsule by mouth daily.     potassium chloride (KLOR-CON) 10 MEQ tablet Take 10 mEq by mouth daily.     tretinoin (RETIN-A) 0.05 % cream Apply topically at bedtime. USE EVERY OTHER NIGHT FOR 1 MONTH, AFTER ONE MONTH INCREASE USE TO NIGHTLY 45 g 2   empagliflozin (JARDIANCE) 10 MG TABS tablet Take 1 tablet (10 mg total) by mouth daily. 90 tablet 3   No current facility-administered medications for this visit.    PHYSICAL EXAM: Vitals:   03/04/23 0945  BP: (!) 138/100  Pulse: 87  Resp: 20  SpO2: 98%  Weight: 193 lb 9.6 oz (87.8 kg)  Height: 5\' 10"  (1.778 m)   Body mass index is 27.78 kg/m.  Wt Readings from Last 3 Encounters:  03/04/23 193 lb 9.6 oz (87.8 kg)  01/21/23 193 lb (87.5 kg)  01/01/23 193 lb (87.5 kg)     General: Well developed, well nourished male in no apparent distress.  HEENT: AT/[place], no external lesions.  Eyes: Conjunctiva clear and no icterus. Neck: Neck supple  Lungs: Respirations not labored Neurologic: Alert, oriented, normal speech Extremities / Skin: Dry.   Psychiatric: Does not appear depressed or anxious  Diabetic Foot Exam - Simple   No data filed     LABS Reviewed Lab Results  Component Value Date   HGBA1C 6.1 (A) 03/04/2023   HGBA1C 7.2 (A) 11/06/2022   HGBA1C 6.9 (H) 05/29/2022   No results found for: "FRUCTOSAMINE" Lab Results  Component Value Date   CHOL 94 (L) 05/29/2022   HDL 42 05/29/2022   LDLCALC 39 05/29/2022   TRIG 53 05/29/2022   CHOLHDL 2.2 05/29/2022   Lab Results  Component Value Date   MICRALBCREAT 13 05/29/2022   MICRALBCREAT 30-300 11/24/2019   Lab Results  Component Value Date   CREATININE 0.95 12/30/2022   No results found for: "GFR"  ASSESSMENT / PLAN  1. Type 2 diabetes mellitus without complication, without long-term current use of insulin (HCC)   2. Type 2 diabetes mellitus without complication, unspecified whether long term insulin use (HCC)     Diabetes Mellitus type 2, complicated by no known complications. - Diabetic status / severity: Controlled.  Lab Results  Component Value Date   HGBA1C 6.1 (A) 03/04/2023    - Hemoglobin A1c goal : <7%  Patient has controlled type 2 diabetes mellitus.  Discussed about type 2 diabetes mellitus, potential chronic complications.  Advised to stay on diabetic diet plan and make a schedule exercise plan.  - Medications: No change.  I) Jardiance 10 mg daily.  - Home glucose testing: Few times a week at different times of the day. - Discussed/ Gave Hypoglycemia treatment plan.  # Consult :  not required at this time.   # Annual urine for microalbuminuria/ creatinine ratio, no microalbuminuria currently, continue ACE/ARB /losartan. Last  Lab Results  Component Value Date    MICRALBCREAT 13 05/29/2022    # Foot check nightly.  # Annual dilated diabetic eye exams.   - Diet: Make healthy diabetic food choices - Life style / activity / exercise: Discussed.  2. Blood pressure  -  BP Readings from Last 1 Encounters:  03/04/23 (!) 138/100    - Control is not in target.  - No change in current plans.  Asked to monitor at home.  He does not have blood pressure medicine.  Asked that he can go to pharmacy and check the blood pressure.  He reports blood pressure is being actively managed by primary care provider.  He is asymptomatic today.  3. Lipid status / Hyperlipidemia - Last  Lab Results  Component Value Date   LDLCALC 39 05/29/2022   - Continue atorvastatin 40 mg daily.  Diagnoses and all orders for this visit:  Type 2 diabetes mellitus without complication, without long-term current use of insulin (HCC) -     POCT glycosylated hemoglobin (Hb A1C) -     Lipid panel; Future -     Microalbumin / creatinine urine ratio; Future -     Hemoglobin A1c; Future -     Basic metabolic panel; Future  Type 2 diabetes mellitus without complication, unspecified whether long term insulin use (HCC) -     empagliflozin (JARDIANCE) 10 MG TABS tablet; Take 1 tablet (10 mg total) by mouth daily.    DISPOSITION Follow up in clinic in   months suggested.   All questions answered and patient verbalized understanding of the plan.  Eric Aspen Lawrance, MD [facility] Endocrinology [facility] [address] [place], [address] [place], [place] [address] Phone # [phone]  At least part of this note was generated using voice recognition software. Inadvertent word errors may have occurred, which were not recognized during the proofreading process.

## 2023-04-01 ENCOUNTER — Ambulatory Visit: Payer: Medicaid Other | Admitting: Dermatology

## 2023-04-02 ENCOUNTER — Inpatient Hospital Stay: Payer: Medicaid Other | Attending: Oncology | Admitting: Oncology

## 2023-04-02 ENCOUNTER — Inpatient Hospital Stay: Payer: Medicaid Other

## 2023-04-02 VITALS — BP 140/89 | HR 89 | Temp 98.1°F | Resp 18 | Ht 70.0 in | Wt 194.4 lb

## 2023-04-02 DIAGNOSIS — F17210 Nicotine dependence, cigarettes, uncomplicated: Secondary | ICD-10-CM | POA: Insufficient documentation

## 2023-04-02 DIAGNOSIS — I10 Essential (primary) hypertension: Secondary | ICD-10-CM | POA: Insufficient documentation

## 2023-04-02 DIAGNOSIS — E1159 Type 2 diabetes mellitus with other circulatory complications: Secondary | ICD-10-CM | POA: Diagnosis not present

## 2023-04-02 DIAGNOSIS — Z5112 Encounter for antineoplastic immunotherapy: Secondary | ICD-10-CM | POA: Diagnosis present

## 2023-04-02 DIAGNOSIS — M3119 Other thrombotic microangiopathy: Secondary | ICD-10-CM | POA: Diagnosis present

## 2023-04-02 LAB — CMP (CANCER CENTER ONLY)
ALT: 32 U/L (ref 0–44)
AST: 23 U/L (ref 15–41)
Albumin: 4.2 g/dL (ref 3.5–5.0)
Alkaline Phosphatase: 130 U/L — ABNORMAL HIGH (ref 38–126)
Anion gap: 7 (ref 5–15)
BUN: 12 mg/dL (ref 6–20)
CO2: 30 mmol/L (ref 22–32)
Calcium: 9.7 mg/dL (ref 8.9–10.3)
Chloride: 105 mmol/L (ref 98–111)
Creatinine: 0.87 mg/dL (ref 0.61–1.24)
GFR, Estimated: >60 mL/min (ref >60)
Glucose, Bld: 194 mg/dL — ABNORMAL HIGH (ref 70–99)
Potassium: 3.5 mmol/L (ref 3.5–5.1)
Sodium: 142 mmol/L (ref 135–145)
Total Bilirubin: 1.5 mg/dL — ABNORMAL HIGH (ref <1.2)
Total Protein: 6.4 g/dL — ABNORMAL LOW (ref 6.5–8.1)

## 2023-04-02 LAB — CBC WITH DIFFERENTIAL (CANCER CENTER ONLY)
Abs Immature Granulocytes: 0.02 10*3/uL (ref 0.00–0.07)
Basophils Absolute: 0 10*3/uL (ref 0.0–0.1)
Basophils Relative: 0 %
Eosinophils Absolute: 0.3 10*3/uL (ref 0.0–0.5)
Eosinophils Relative: 4 %
HCT: 48.5 % (ref 39.0–52.0)
Hemoglobin: 17 g/dL (ref 13.0–17.0)
Immature Granulocytes: 0 %
Lymphocytes Relative: 41 %
Lymphs Abs: 3.7 10*3/uL (ref 0.7–4.0)
MCH: 31.8 pg (ref 26.0–34.0)
MCHC: 35.1 g/dL (ref 30.0–36.0)
MCV: 90.7 fL (ref 80.0–100.0)
Monocytes Absolute: 0.6 10*3/uL (ref 0.1–1.0)
Monocytes Relative: 6 %
Neutro Abs: 4.3 10*3/uL (ref 1.7–7.7)
Neutrophils Relative %: 49 %
Platelet Count: 170 10*3/uL (ref 150–400)
RBC: 5.35 MIL/uL (ref 4.22–5.81)
RDW: 13.2 % (ref 11.5–15.5)
WBC Count: 9 10*3/uL (ref 4.0–10.5)
nRBC: 0 % (ref 0.0–0.2)

## 2023-04-02 LAB — LACTATE DEHYDROGENASE: LDH: 157 U/L (ref 98–192)

## 2023-04-02 NOTE — Progress Notes (Signed)
[facility] OFFICE PROGRESS NOTE   Diagnosis: TTP  INTERVAL HISTORY:   Eric. Hooper is as scheduled.  He feels well.  No bleeding.  No fever.  He is being treated for hypertension and diabetes.  Folliculitis has improved.  Objective:  Vital signs in last 24 hours:  Blood pressure (!) 141/91, pulse 89, temperature 98.1 F (36.7 C), temperature source Temporal, resp. rate 18, height 5\' 10"  (1.778 m), weight 194 lb 6.4 oz (88.2 kg), SpO2 99%.   Resp: Lungs clear bilaterally Cardio: Regular rhythm with premature beats GI: No hepatosplenomegaly Vascular: No leg edema  Lab Results:  Lab Results  Component Value Date   WBC 9.0 04/02/2023   HGB 17.0 04/02/2023   HCT 48.5 04/02/2023   MCV 90.7 04/02/2023   PLT 170 04/02/2023   NEUTROABS 4.3 04/02/2023    CMP  Lab Results  Component Value Date   NA 141 12/30/2022   K 3.2 (L) 12/30/2022   CL 107 12/30/2022   CO2 27 12/30/2022   GLUCOSE 116 (H) 12/30/2022   BUN 16 12/30/2022   CREATININE 0.95 12/30/2022   CALCIUM 9.3 12/30/2022   PROT 6.9 12/30/2022   ALBUMIN 4.4 12/30/2022   AST 19 12/30/2022   ALT 33 12/30/2022   ALKPHOS 107 12/30/2022   BILITOT 1.2 12/30/2022   GFRNONAA >60 12/30/2022   GFRAA 125 06/14/2020     Medications: I have reviewed the patient's current medications.   Assessment/Plan: TTP initially diagnosed in October 2018 with recurrence in April 2020 and July 2023. Initiation of daily plasma exchange and Solu-Medrol 02/02/2017 [name] Antibody- 72, activity less than 2% on initial presentation; [name] from 08/15/2018 <2.0. Last plasma exchange 02/06/2017 Prednisone 60 mg daily beginning 02/08/2017 Prednisone 40 mg daily beginning 02/17/2017 Prednisone 30 mg daily beginning 03/09/2017 Prednisone 20 mg daily beginning 03/23/2017 Prednisone 10 mg daily beginning 04/01/2017 Prednisone 5 mg daily for 5 days then 5 mg every other day for 5 doses then stop beginning 05/11/2017 Recurrent  TTP 08/14/2018 Plasma exchange/steroids started on 08/15/2018.  Plasma exchange discontinued after treatment on 08/19/2019, discharged on prednisone 60mg  daily Prednisone taper to 40 mg daily 08/22/2018 Weekly rituximab for 4 doses beginning 08/25/2018 Plasma exchange resumed 08/26/2018, 08/27/2018, 08/28/2018, 08/29/2018, 08/31/2018, 09/02/2018 Prednisone taper to 30 mg daily 09/01/2018 Cycle 3 weekly Rituxan 09/08/2018 Prednisone taper to 20 mg daily 09/08/2018 Cycle 4 weekly Rituxan 09/15/2018 Prednisone taper to 10 mg daily 09/20/2018 Prednisone taper to 5 mg daily 09/27/2018 Prednisone discontinued 10/07/2018 Admission with relapse of TTP 10/23/2021 Daily Plasma exchange initiated 11/23/2021 prednisone 8/1 Prednisone tapered to 40 mg daily beginning 12/04/2021 Weekly Rituxan for 4 doses beginning 12/04/2021 Discontinue plasmapheresis after appointment 12/05/2021 Admitted to [facility] 12/11/2021-plasmapheresis reinitiated, Cablivi started 12/12/2021, prednisone dose currently 80 mg daily Every other day plasma exchange beginning 12/28/2021-completed 01/09/2022 Second weekly Rituxan 12/31/2021 Prednisone taper to 60 mg daily beginning 12/31/2021 Third weekly Rituxan 01/06/2022 Prednisone taper to 40 mg daily beginning 01/07/2022 Fourth weekly rituximab 01/13/2022 Prednisone taper to 30 mg daily 01/13/2022 Prednisone taper to 20 mg daily 01/20/2022 Prednisone taper to 15 mg daily 02/04/2022 Per patient report 02/19/2022 he has continued prednisone 30 mg daily and did not make the above adjustments, he will decrease prednisone to 20 mg daily today Prednisone taper to 10 mg daily 03/09/2022 Prednisone taper to 5 mg daily 03/26/2022 Prednisone taper to 5 mg every other day for 5 doses beginning 04/16/2022 2. Xiphoid pain-etiology unclear 3. Hypertension 4. Diabetes 5. Alcohol/Tobacco use 6. History of  renal insufficiency 7. Right IJ dialysis catheter placed 02/02/2017, removed New right IJ dialysis catheter  08/15/2018 Catheter removed 09/12/2018 New right IJ dialysis catheter placed 11/23/2021 Temporary hemodialysis catheter converted to tunneled hemodialysis catheter 11/28/2021     Disposition: Eric Warne is in clinical remission from TTP.  Platelet count, hemoglobin and LDH are normal.  The bilirubin is mildly elevated.  The ADAMTS 13 was mildly decreased 12/30/2022.  We will follow-up on the [name] activity today.  Eric Verville will return for an office and lab visit in 3 months.  He will call for new symptoms.  Thornton Papas, MD  04/02/2023  11:23 AM

## 2023-04-05 ENCOUNTER — Other Ambulatory Visit: Payer: Self-pay | Admitting: Nurse Practitioner

## 2023-04-05 ENCOUNTER — Telehealth: Payer: Self-pay

## 2023-04-05 ENCOUNTER — Other Ambulatory Visit: Payer: Self-pay

## 2023-04-05 DIAGNOSIS — M3119 Other thrombotic microangiopathy: Secondary | ICD-10-CM

## 2023-04-05 NOTE — Telephone Encounter (Signed)
CRITICAL VALUE STICKER  CRITICAL VALUE: ADAMTS 13  RECEIVER (on-site recipient of call):Orley Lawry   DATE & TIME NOTIFIED: 04/05/23 1619  MESSENGER (representative from lab):Phyllis Gwyn  MD NOTIFIED: GBS  TIME OF NOTIFICATION:04/05/23 1619  RESPONSE:  repeat lab on 04/08/23

## 2023-04-06 LAB — ADAMTS13 ANTIBODY: ADAMTS13 Antibody: 6 U/mL (ref <12)

## 2023-04-08 ENCOUNTER — Inpatient Hospital Stay: Payer: Medicaid Other

## 2023-04-08 DIAGNOSIS — M3119 Other thrombotic microangiopathy: Secondary | ICD-10-CM

## 2023-04-08 DIAGNOSIS — Z5112 Encounter for antineoplastic immunotherapy: Secondary | ICD-10-CM | POA: Diagnosis not present

## 2023-04-08 LAB — CMP (CANCER CENTER ONLY)
ALT: 34 U/L (ref 0–44)
AST: 22 U/L (ref 15–41)
Albumin: 4.4 g/dL (ref 3.5–5.0)
Alkaline Phosphatase: 130 U/L — ABNORMAL HIGH (ref 38–126)
Anion gap: 8 (ref 5–15)
BUN: 10 mg/dL (ref 6–20)
CO2: 30 mmol/L (ref 22–32)
Calcium: 9.6 mg/dL (ref 8.9–10.3)
Chloride: 106 mmol/L (ref 98–111)
Creatinine: 0.9 mg/dL (ref 0.61–1.24)
GFR, Estimated: >60 mL/min (ref >60)
Glucose, Bld: 108 mg/dL — ABNORMAL HIGH (ref 70–99)
Potassium: 3.6 mmol/L (ref 3.5–5.1)
Sodium: 144 mmol/L (ref 135–145)
Total Bilirubin: 1.1 mg/dL (ref <1.2)
Total Protein: 6.8 g/dL (ref 6.5–8.1)

## 2023-04-08 LAB — CBC WITH DIFFERENTIAL (CANCER CENTER ONLY)
Abs Immature Granulocytes: 0.02 10*3/uL (ref 0.00–0.07)
Basophils Absolute: 0 10*3/uL (ref 0.0–0.1)
Basophils Relative: 0 %
Eosinophils Absolute: 0.3 10*3/uL (ref 0.0–0.5)
Eosinophils Relative: 3 %
HCT: 51.3 % (ref 39.0–52.0)
Hemoglobin: 18 g/dL — ABNORMAL HIGH (ref 13.0–17.0)
Immature Granulocytes: 0 %
Lymphocytes Relative: 38 %
Lymphs Abs: 4.2 10*3/uL — ABNORMAL HIGH (ref 0.7–4.0)
MCH: 31.8 pg (ref 26.0–34.0)
MCHC: 35.1 g/dL (ref 30.0–36.0)
MCV: 90.6 fL (ref 80.0–100.0)
Monocytes Absolute: 0.7 10*3/uL (ref 0.1–1.0)
Monocytes Relative: 6 %
Neutro Abs: 5.8 10*3/uL (ref 1.7–7.7)
Neutrophils Relative %: 53 %
Platelet Count: 182 10*3/uL (ref 150–400)
RBC: 5.66 MIL/uL (ref 4.22–5.81)
RDW: 13.1 % (ref 11.5–15.5)
WBC Count: 11.1 10*3/uL — ABNORMAL HIGH (ref 4.0–10.5)
nRBC: 0 % (ref 0.0–0.2)

## 2023-04-08 LAB — LACTATE DEHYDROGENASE: LDH: 147 U/L (ref 98–192)

## 2023-04-13 ENCOUNTER — Telehealth: Payer: Self-pay

## 2023-04-13 LAB — ADAMTS13 ANTIBODY: ADAMTS13 Antibody: 6 U/mL (ref <12)

## 2023-04-13 NOTE — Telephone Encounter (Signed)
CRITICAL VALUE STICKER  CRITICAL VALUE: ADAMST13 7.2 RECEIVER (on-site recipient of call): Criselda Starke DATE & TIME NOTIFIED: 04/13/23 1056  MESSENGER (representative from lab): Ezra Sites MD NOTIFIED:   TIME OF NOTIFICATION: 04/13/23 at 1056 RESPONSE:

## 2023-04-14 ENCOUNTER — Telehealth: Payer: Self-pay | Admitting: *Deleted

## 2023-04-14 DIAGNOSIS — M3119 Other thrombotic microangiopathy: Secondary | ICD-10-CM

## 2023-04-14 NOTE — Telephone Encounter (Signed)
Notified Eric Hooper of ADAMTS activity test. Per Dr. Truett Perna: Needs office visit and rituximab next 1 week, will give 1 dose and f/u ADAMTS in 1 month. Patient agrees and appointments have been scheduled.

## 2023-04-16 ENCOUNTER — Other Ambulatory Visit: Payer: Self-pay | Admitting: Oncology

## 2023-04-16 LAB — ADAMTS13 ACTIVITY
Adamts 13 Activity: 14.2 % (ref >66.8)
Adamts 13 Activity: 7.2 % (ref >66.8)

## 2023-04-16 NOTE — Progress Notes (Signed)
START OFF PATHWAY REGIMEN - Other   [id number]:Rituximab IV/SUBQ D1 q7 Days:   Cycle 1: A cycle is 7 days:     Rituximab-xxxx    Cycles 2 and beyond: A cycle is every 7 days:     Rituximab and hyaluronidase human   **Always confirm dose/schedule in your pharmacy ordering system**  Patient Characteristics: Intent of Therapy: Non-Curative / Palliative Intent, Discussed with Patient

## 2023-04-18 ENCOUNTER — Other Ambulatory Visit: Payer: Self-pay

## 2023-04-22 ENCOUNTER — Inpatient Hospital Stay: Payer: Medicaid Other

## 2023-04-22 ENCOUNTER — Other Ambulatory Visit: Payer: Self-pay | Admitting: *Deleted

## 2023-04-22 ENCOUNTER — Inpatient Hospital Stay: Payer: Medicaid Other | Admitting: Oncology

## 2023-04-22 VITALS — BP 142/82 | HR 79 | Temp 98.1°F | Resp 18 | Ht 70.0 in | Wt 193.8 lb

## 2023-04-22 VITALS — BP 111/77 | HR 72 | Temp 98.1°F | Resp 18

## 2023-04-22 DIAGNOSIS — M3119 Other thrombotic microangiopathy: Secondary | ICD-10-CM

## 2023-04-22 DIAGNOSIS — Z5112 Encounter for antineoplastic immunotherapy: Secondary | ICD-10-CM | POA: Diagnosis not present

## 2023-04-22 LAB — MAGNESIUM: Magnesium: 1.8 mg/dL (ref 1.7–2.4)

## 2023-04-22 LAB — CBC WITH DIFFERENTIAL (CANCER CENTER ONLY)
Abs Immature Granulocytes: 0.02 10*3/uL (ref 0.00–0.07)
Basophils Absolute: 0.1 10*3/uL (ref 0.0–0.1)
Basophils Relative: 0 %
Eosinophils Absolute: 0.3 10*3/uL (ref 0.0–0.5)
Eosinophils Relative: 3 %
HCT: 48.5 % (ref 39.0–52.0)
Hemoglobin: 17.1 g/dL — ABNORMAL HIGH (ref 13.0–17.0)
Immature Granulocytes: 0 %
Lymphocytes Relative: 43 %
Lymphs Abs: 5.4 10*3/uL — ABNORMAL HIGH (ref 0.7–4.0)
MCH: 31.6 pg (ref 26.0–34.0)
MCHC: 35.3 g/dL (ref 30.0–36.0)
MCV: 89.6 fL (ref 80.0–100.0)
Monocytes Absolute: 0.8 10*3/uL (ref 0.1–1.0)
Monocytes Relative: 6 %
Neutro Abs: 5.9 10*3/uL (ref 1.7–7.7)
Neutrophils Relative %: 48 %
Platelet Count: 161 10*3/uL (ref 150–400)
RBC: 5.41 MIL/uL (ref 4.22–5.81)
RDW: 13 % (ref 11.5–15.5)
WBC Count: 12.4 10*3/uL — ABNORMAL HIGH (ref 4.0–10.5)
nRBC: 0 % (ref 0.0–0.2)

## 2023-04-22 LAB — CMP (CANCER CENTER ONLY)
ALT: 30 U/L (ref 0–44)
AST: 17 U/L (ref 15–41)
Albumin: 4 g/dL (ref 3.5–5.0)
Alkaline Phosphatase: 116 U/L (ref 38–126)
Anion gap: 8 (ref 5–15)
BUN: 14 mg/dL (ref 6–20)
CO2: 26 mmol/L (ref 22–32)
Calcium: 9 mg/dL (ref 8.9–10.3)
Chloride: 107 mmol/L (ref 98–111)
Creatinine: 0.76 mg/dL (ref 0.61–1.24)
GFR, Estimated: >60 mL/min (ref >60)
Glucose, Bld: 174 mg/dL — ABNORMAL HIGH (ref 70–99)
Potassium: 3.2 mmol/L — ABNORMAL LOW (ref 3.5–5.1)
Sodium: 141 mmol/L (ref 135–145)
Total Bilirubin: 1.4 mg/dL — ABNORMAL HIGH (ref <1.2)
Total Protein: 6 g/dL — ABNORMAL LOW (ref 6.5–8.1)

## 2023-04-22 LAB — LACTATE DEHYDROGENASE: LDH: 140 U/L (ref 98–192)

## 2023-04-22 LAB — HEPATITIS B SURFACE ANTIGEN: Hepatitis B Surface Ag: NONREACTIVE

## 2023-04-22 LAB — HEPATITIS B CORE ANTIBODY, TOTAL: Hep B Core Total Ab: NONREACTIVE

## 2023-04-22 MED ORDER — ACETAMINOPHEN 325 MG PO TABS
650.0000 mg | ORAL_TABLET | Freq: Once | ORAL | Status: AC
  Administered 2023-04-22: 650 mg via ORAL
  Filled 2023-04-22: qty 2

## 2023-04-22 MED ORDER — RITUXIMAB-PVVR CHEMO 500 MG/50ML IV SOLN
375.0000 mg/m2 | Freq: Once | INTRAVENOUS | Status: AC
  Administered 2023-04-22: 800 mg via INTRAVENOUS
  Filled 2023-04-22: qty 50

## 2023-04-22 MED ORDER — DIPHENHYDRAMINE HCL 25 MG PO CAPS
50.0000 mg | ORAL_CAPSULE | Freq: Once | ORAL | Status: AC
  Administered 2023-04-22: 50 mg via ORAL
  Filled 2023-04-22: qty 2

## 2023-04-22 MED ORDER — SODIUM CHLORIDE 0.9 % IV SOLN
INTRAVENOUS | Status: DC
Start: 2023-04-22 — End: 2023-04-22

## 2023-04-22 NOTE — Progress Notes (Signed)
Patient seen by Dr. Thornton Papas today  Vitals are within treatment parameters:Yes   Labs are within treatment parameters: Yes Do not need to wait on rest of CBC  Treatment plan has been signed: Yes   Per physician team, Patient is ready for treatment and there are NO modifications to the treatment plan.

## 2023-04-22 NOTE — Patient Instructions (Signed)
[facility] A DE[facility] OF [facility]   Discharge Instructions: Thank you for choosing [facility] to provide your oncology and hematology care.   If you have a lab appointment with the Cancer Center, please go directly to the Cancer Center and check in at the registration area.   Wear comfortable clothing and clothing appropriate for easy access to any Portacath or PICC line.   We strive to give you quality time with your provider. You may need to reschedule your appointment if you arrive late (15 or more minutes).  Arriving late affects you and other patients whose appointments are after yours.  Also, if you miss three or more appointments without notifying the office, you may be dismissed from the clinic at the provider's discretion.      For prescription refill requests, have your pharmacy contact our office and allow 72 hours for refills to be completed.    Today you received the following chemotherapy and/or immunotherapy agents RITUXIMAB      To help prevent nausea and vomiting after your treatment, we encourage you to take your nausea medication as directed.  BELOW ARE SYMPTOMS THAT SHOULD BE REPORTED IMMEDIATELY: *FEVER GREATER THAN 100.4 F (38 C) OR HIGHER *CHILLS OR SWEATING *NAUSEA AND VOMITING THAT IS NOT CONTROLLED WITH YOUR NAUSEA MEDICATION *UNUSUAL SHORTNESS OF BREATH *UNUSUAL BRUISING OR BLEEDING *URINARY PROBLEMS (pain or burning when urinating, or frequent urination) *BOWEL PROBLEMS (unusual diarrhea, constipation, pain near the anus) TENDERNESS IN MOUTH AND THROAT WITH OR WITHOUT PRESENCE OF ULCERS (sore throat, sores in mouth, or a toothache) UNUSUAL RASH, SWELLING OR PAIN  UNUSUAL VAGINAL DISCHARGE OR ITCHING   Items with * indicate a potential emergency and should be followed up as soon as possible or go to the Emergency Department if any problems should occur.  Please show the CHEMOTHERAPY ALERT CARD or IMMUNOTHERAPY  ALERT CARD at check-in to the Emergency Department and triage nurse.  Should you have questions after your visit or need to cancel or reschedule your appointment, please contact [facility] - A DEPT OF [facility]  Dept: [phone]  and follow the prompts.  Office hours are 8:00 a.m. to 4:30 p.m. Monday - Friday. Please note that voicemails left after 4:00 p.m. may not be returned until the following business day.  We are closed weekends and major holidays. You have access to a nurse at all times for urgent questions. Please call the main number to the clinic Dept: [phone] and follow the prompts.   For any non-urgent questions, you may also contact your provider using MyChart. We now offer e-Visits for anyone 35 and older to request care online for non-urgent symptoms. For details visit [email or web address].   Also download the MyChart app! Go to the app store, search "MyChart", open the app, select [place], and log in with your MyChart username and password.  Rituximab Injection What is this medication? RITUXIMAB (ri TUX i mab) treats leukemia and lymphoma. It works by blocking a protein that causes cancer cells to grow and multiply. This helps to slow or stop the spread of cancer cells. It may also be used to treat autoimmune conditions, such as arthritis. It works by slowing down an overactive immune system. It is a monoclonal antibody. This medicine may be used for other purposes; ask your health care provider or pharmacist if you have questions. COMMON BRAND NAME(S): RIABNI, Rituxan, RUXIENCE, truxima What should I  tell my care team before I take this medication? They need to know if you have any of these conditions: Chest pain Heart disease Immune system problems Infection, such as chickenpox, cold sores, hepatitis B, herpes Irregular heartbeat or rhythm Kidney disease Low blood counts, such as low white cells, platelets, red cells Lung  disease Recent or upcoming vaccine An unusual or allergic reaction to rituximab, other medications, foods, dyes, or preservatives Pregnant or trying to get pregnant Breast-feeding How should I use this medication? This medication is injected into a vein. It is given by a care team in a hospital or clinic setting. A special MedGuide will be given to you before each treatment. Be sure to read this information carefully each time. Talk to your care team about the use of this medication in children. While this medication may be prescribed for children as young as 6 months for selected conditions, precautions do apply. Overdosage: If you think you have taken too much of this medicine contact a poison control center or emergency room at once. NOTE: This medicine is only for you. Do not share this medicine with others. What if I miss a dose? Keep appointments for follow-up doses. It is important not to miss your dose. Call your care team if you are unable to keep an appointment. What may interact with this medication? Do not take this medication with any of the following: Live vaccines This medication may also interact with the following: Cisplatin This list may not describe all possible interactions. Give your health care provider a list of all the medicines, herbs, non-prescription drugs, or dietary supplements you use. Also tell them if you smoke, drink alcohol, or use illegal drugs. Some items may interact with your medicine. What should I watch for while using this medication? Your condition will be monitored carefully while you are receiving this medication. You may need blood work while taking this medication. This medication can cause serious infusion reactions. To reduce the risk your care team may give you other medications to take before receiving this one. Be sure to follow the directions from your care team. This medication may increase your risk of getting an infection. Call your care  team for advice if you get a fever, chills, sore throat, or other symptoms of a cold or flu. Do not treat yourself. Try to avoid being around people who are sick. Call your care team if you are around anyone with measles, chickenpox, or if you develop sores or blisters that do not heal properly. Avoid taking medications that contain aspirin, acetaminophen, ibuprofen, naproxen, or ketoprofen unless instructed by your care team. These medications may hide a fever. This medication may cause serious skin reactions. They can happen weeks to months after starting the medication. Contact your care team right away if you notice fevers or flu-like symptoms with a rash. The rash may be red or purple and then turn into blisters or peeling of the skin. You may also notice a red rash with swelling of the face, lips, or lymph nodes in your neck or under your arms. In some patients, this medication may cause a serious brain infection that may cause death. If you have any problems seeing, thinking, speaking, walking, or standing, tell your care team right away. If you cannot reach your care team, urgently seek another source of medical care. Talk to your care team if you may be pregnant. Serious birth defects can occur if you take this medication during pregnancy and for 38  months after the last dose. You will need a negative pregnancy test before starting this medication. Contraception is recommended while taking this medication and for 12 months after the last dose. Your care team can help you find the option that works for you. Do not breastfeed while taking this medication and for at least 6 months after the last dose. What side effects may I notice from receiving this medication? Side effects that you should report to your care team as soon as possible: Allergic reactions or angioedema--skin rash, itching or hives, swelling of the face, eyes, lips, tongue, arms, or legs, trouble swallowing or breathing Bowel  blockage--stomach cramping, unable to have a bowel movement or pass gas, loss of appetite, vomiting Dizziness, loss of balance or coordination, confusion or trouble speaking Heart attack--pain or tightness in the chest, shoulders, arms, or jaw, nausea, shortness of breath, cold or clammy skin, feeling faint or lightheaded Heart rhythm changes--fast or irregular heartbeat, dizziness, feeling faint or lightheaded, chest pain, trouble breathing Infection--fever, chills, cough, sore throat, wounds that don't heal, pain or trouble when passing urine, general feeling of discomfort or being unwell Infusion reactions--chest pain, shortness of breath or trouble breathing, feeling faint or lightheaded Kidney injury--decrease in the amount of urine, swelling of the ankles, hands, or feet Liver injury--right upper belly pain, loss of appetite, nausea, light-colored stool, dark yellow or brown urine, yellowing skin or eyes, unusual weakness or fatigue Redness, blistering, peeling, or loosening of the skin, including inside the mouth Stomach pain that is severe, does not go away, or gets worse Tumor lysis syndrome (TLS)--nausea, vomiting, diarrhea, decrease in the amount of urine, dark urine, unusual weakness or fatigue, confusion, muscle pain or cramps, fast or irregular heartbeat, joint pain Side effects that usually do not require medical attention (report to your care team if they continue or are bothersome): Headache Joint pain Nausea Runny or stuffy nose Unusual weakness or fatigue This list may not describe all possible side effects. Call your doctor for medical advice about side effects. You may report side effects to FDA at 1-800-FDA-1088. Where should I keep my medication? This medication is given in a hospital or clinic. It will not be stored at home. NOTE: This sheet is a summary. It may not cover all possible information. If you have questions about this medicine, talk to your doctor, pharmacist,  or health care provider.  2024 Elsevier/Gold Standard (2021-09-04 00:00:00)

## 2023-04-22 NOTE — Progress Notes (Signed)
Dr Truett Perna notified of blood pressure trending down during titration of Rituximab, per Dr Truett Perna continue as long as patient has no complaints of symptoms.

## 2023-04-22 NOTE — Progress Notes (Signed)
[facility] OFFICE PROGRESS NOTE   Diagnosis: TTP  INTERVAL HISTORY:   Eric Hooper returns as scheduled.  He feels well.  No fever, headache, neurologic symptoms, or symptom of thrombosis.  He reports transient pain throughout the left leg on 04/13/2023.  No leg swelling.  The pain has resolved.  He has intermittent episodes of a heavy feeling in his head and neck when sitting on the sofa.  This last for minutes.  No syncope.  No chest pain.  Objective:  Vital signs in last 24 hours:  Blood pressure (!) 142/82, pulse 79, temperature 98.1 F (36.7 C), temperature source Temporal, resp. rate 18, height 5\' 10"  (1.778 m), weight 193 lb 12.8 oz (87.9 kg), SpO2 100%.    Resp: Lungs clear bilaterally Cardio: Regular rate and rhythm with frequent premature and group beats GI: No hepatosplenomegaly Vascular: No leg edema   Lab Results:  Lab Results  Component Value Date   WBC 12.4 (H) 04/22/2023   HGB 17.1 (H) 04/22/2023   HCT 48.5 04/22/2023   MCV 89.6 04/22/2023   PLT 161 04/22/2023   NEUTROABS PENDING 04/22/2023    CMP  Lab Results  Component Value Date   NA 141 04/22/2023   K 3.2 (L) 04/22/2023   CL 107 04/22/2023   CO2 26 04/22/2023   GLUCOSE 174 (H) 04/22/2023   BUN 14 04/22/2023   CREATININE 0.76 04/22/2023   CALCIUM 9.0 04/22/2023   PROT 6.0 (L) 04/22/2023   ALBUMIN 4.0 04/22/2023   AST 17 04/22/2023   ALT 30 04/22/2023   ALKPHOS 116 04/22/2023   BILITOT 1.4 (H) 04/22/2023   GFRNONAA >60 04/22/2023   GFRAA 125 06/14/2020    Medications: I have reviewed the patient's current medications.   Assessment/Plan: TTP initially diagnosed in October 2018 with recurrence in April 2020 and July 2023. Initiation of daily plasma exchange and Solu-Medrol 02/02/2017 [name] Antibody- 72, activity less than 2% on initial presentation; [name] from 08/15/2018 <2.0. Last plasma exchange 02/06/2017 Prednisone 60 mg daily beginning 02/08/2017 Prednisone 40  mg daily beginning 02/17/2017 Prednisone 30 mg daily beginning 03/09/2017 Prednisone 20 mg daily beginning 03/23/2017 Prednisone 10 mg daily beginning 04/01/2017 Prednisone 5 mg daily for 5 days then 5 mg every other day for 5 doses then stop beginning 05/11/2017 Recurrent TTP 08/14/2018 Plasma exchange/steroids started on 08/15/2018.  Plasma exchange discontinued after treatment on 08/19/2019, discharged on prednisone 60mg  daily Prednisone taper to 40 mg daily 08/22/2018 Weekly rituximab for 4 doses beginning 08/25/2018 Plasma exchange resumed 08/26/2018, 08/27/2018, 08/28/2018, 08/29/2018, 08/31/2018, 09/02/2018 Prednisone taper to 30 mg daily 09/01/2018 Cycle 3 weekly Rituxan 09/08/2018 Prednisone taper to 20 mg daily 09/08/2018 Cycle 4 weekly Rituxan 09/15/2018 Prednisone taper to 10 mg daily 09/20/2018 Prednisone taper to 5 mg daily 09/27/2018 Prednisone discontinued 10/07/2018 Admission with relapse of TTP 10/23/2021 Daily Plasma exchange initiated 11/23/2021 prednisone 8/1 Prednisone tapered to 40 mg daily beginning 12/04/2021 Weekly Rituxan for 4 doses beginning 12/04/2021 Discontinue plasmapheresis after appointment 12/05/2021 Admitted to [facility] 12/11/2021-plasmapheresis reinitiated, Cablivi started 12/12/2021, prednisone dose currently 80 mg daily Every other day plasma exchange beginning 12/28/2021-completed 01/09/2022 Second weekly Rituxan 12/31/2021 Prednisone taper to 60 mg daily beginning 12/31/2021 Third weekly Rituxan 01/06/2022 Prednisone taper to 40 mg daily beginning 01/07/2022 Fourth weekly rituximab 01/13/2022 Prednisone taper to 30 mg daily 01/13/2022 Prednisone taper to 20 mg daily 01/20/2022 Prednisone taper to 15 mg daily 02/04/2022 Per patient report 02/19/2022 he has continued prednisone 30 mg daily and did not make the  above adjustments, he will decrease prednisone to 20 mg daily today Prednisone taper to 10 mg daily 03/09/2022 Prednisone taper to 5 mg daily 03/26/2022 Prednisone taper to 5 mg  every other day for 5 doses beginning 04/16/2022 ADAMTS 13 :14.2 on 04/02/2023, 7.2 on 04/08/2023 2. Xiphoid pain-etiology unclear 3. Hypertension 4. Diabetes 5. Alcohol/Tobacco use 6. History of renal insufficiency 7. Right IJ dialysis catheter placed 02/02/2017, removed New right IJ dialysis catheter 08/15/2018 Catheter removed 09/12/2018 New right IJ dialysis catheter placed 11/23/2021 Temporary hemodialysis catheter converted to tunneled hemodialysis catheter 11/28/2021      Disposition: Eric Hooper remains in clinical remission from TTP.  However the ADAMTS activity level is low this month.  He has a history of multiple relapses of TTP.  I recommend treatment with rituximab in an effort to prevent clinical relapse.  He is in agreement.  He has tolerated rituximab well in the past.  He will completed treatment with rituximab today and return for repeat labs in 3-4 weeks. The potassium level is low today.  We will check the magnesium and be sure he is taking a potassium supplement.  PVCs have been noted in the past.   Eric Papas, MD  04/22/2023  8:41 AM

## 2023-05-04 ENCOUNTER — Encounter: Payer: Self-pay | Admitting: Physician Assistant

## 2023-05-04 ENCOUNTER — Ambulatory Visit: Payer: Medicaid Other | Admitting: Physician Assistant

## 2023-05-04 VITALS — BP 104/80 | HR 60 | Ht 68.0 in

## 2023-05-04 DIAGNOSIS — Z860101 Personal history of adenomatous and serrated colon polyps: Secondary | ICD-10-CM | POA: Diagnosis not present

## 2023-05-04 DIAGNOSIS — Z860100 Personal history of colon polyps, unspecified: Secondary | ICD-10-CM

## 2023-05-04 DIAGNOSIS — K219 Gastro-esophageal reflux disease without esophagitis: Secondary | ICD-10-CM

## 2023-05-04 MED ORDER — RABEPRAZOLE SODIUM 20 MG PO TBEC: 40.0000 mg | DELAYED_RELEASE_TABLET | Freq: Two times a day (BID) | ORAL | 6 refills | Status: DC

## 2023-05-04 NOTE — Progress Notes (Signed)
 Subjective:    [name], male    DOB: 23-Aug-1975, 48 y.o.   MRN: [id number]  HPI  Eric Hooper is a pleasant 48 year old white male, established with Dr. Legrand, who had undergone recent EGD and colonoscopy and comes back in today for follow-up and management of somewhat refractory GERD. I had seen him in the office in September with refractory symptoms despite being on Nexium  40 mg p.o. twice daily.  At that time complaining of fullness and heaviness in the esophageal area and some sense of food sitting in the esophagus without regurgitation.  He also has history of adenomatous and sessile serrated polyps and was due for follow-up. At EGD 01/21/2023 he was found to have a normal-appearing esophagus, no hiatal hernia and otherwise negative exam.  Biopsies from the esophagus showed minimal changes consistent with acid reflux and no evidence of increased eosinophils. Colonoscopy on 01/21/2023 with finding of 110 mm polyp in the transverse colon with complete resection and retrieval.  Path consistent with a tubular adenoma and he is indicated for 3-year interval follow-up.  He also has history of TTP, hypertension, cervical radiculopathy, and adult onset diabetes mellitus.  He had recently been seen by Dr. Arvella Hof for TTP, and though at this time felt to be in clinical remission with normal platelet count and hemoglobin the ADAMTS 13 level was decreased and he has since had a dose of rituximab .  He continues on Nexium  40 mg p.o. twice daily.  On further discussion with him today he definitely feels that the Nexium  helps him but does not completely control his symptoms.  He says his heartburn symptoms will go away after he takes a dose of Nexium  in the morning but may come back by lunchtime and be exacerbated by eating.  Similarly in the evening even though he is taken a dose of Nexium  sometimes he will still have heartburn symptoms.  He rarely has nighttime symptoms but occasionally wakes up  with sour brash. He is not currently having any dysphagia or odynophagia symptoms. We discussed Dexilant and he believes that he had tried that at 1 point in the past, he says he really has not found anything that worked better than Nexium .  He does not believe he has tried Aciphex .  Is being good about following an antireflux diet  Review of Systems Pertinent positive and negative review of systems were noted in the above HPI section.  All other review of systems was otherwise negative.   Outpatient Encounter Medications as of 05/04/2023  Medication Sig   acetaminophen  (TYLENOL ) 325 MG tablet Take 2 tablets (650 mg total) by mouth every 4 (four) hours as needed (discomfort or fever).   amLODipine  (NORVASC ) 10 MG tablet Take 1 tablet (10 mg total) by mouth at bedtime.   aspirin  EC 81 MG tablet Take 1 tablet (81 mg total) by mouth daily. Swallow whole.   atorvastatin  (LIPITOR) 40 MG tablet TAKE 1 TABLET BY MOUTH EVERY DAY   benzoyl peroxide  5 % gel Apply topically 2 (two) times daily.   blood glucose meter kit and supplies KIT Dispense based on patient and insurance preference. Use up to four times daily as directed. (FOR ICD-9 250.00, 250.01).   blood glucose meter kit and supplies Dispense based on patient and insurance preference. Use up to four times daily as directed. (FOR ICD-10 E10.9, E11.9).   cetirizine  (ZYRTEC ) 10 MG tablet Take 10 mg by mouth daily.   empagliflozin  (JARDIANCE ) 10 MG TABS tablet Take  1 tablet (10 mg total) by mouth daily.   erythromycin  with ethanol (EMGEL ) 2 % gel Apply daily to affected areas   esomeprazole  (NEXIUM ) 40 MG capsule Take 1 capsule (40 mg total) by mouth 2 (two) times daily before a meal.   losartan  (COZAAR ) 100 MG tablet Take 1 tablet (100 mg total) by mouth at bedtime.   Multiple Vitamin (MULTIVITAMIN) capsule Take 1 capsule by mouth daily.   potassium chloride  (KLOR-CON ) 10 MEQ tablet Take 10 mEq by mouth daily.   RABEprazole  (ACIPHEX ) 20 MG tablet Take  2 tablets (40 mg total) by mouth in the morning and at bedtime.   tretinoin  (RETIN-A ) 0.05 % cream Apply topically at bedtime. USE EVERY OTHER NIGHT FOR 1 MONTH, AFTER ONE MONTH INCREASE USE TO NIGHTLY   EPINEPHrine  0.3 mg/0.3 mL IJ SOAJ injection Inject 0.3 mg into the muscle as needed for anaphylaxis. (Patient not taking: Reported on 04/02/2023)   nicotine  (NICODERM CQ  - DOSED IN MG/24 HOURS) 21 mg/24hr patch Place 21 mg onto the skin daily. (Patient not taking: Reported on 05/04/2023)   [DISCONTINUED] albuterol  (VENTOLIN  HFA) 108 (90 Base) MCG/ACT inhaler INHALE TWO PUFFS INTO THE LUNGS EVERY 6 HOURS AS NEEDED. (Patient not taking: Reported on 04/22/2023)   No facility-administered encounter medications on file as of 05/04/2023.   Allergies  Allergen Reactions   Bee Venom Anaphylaxis   Ibuprofen Other (See Comments)    Drops platelets/ thrombocytopenia   Patient Active Problem List   Diagnosis Date Noted   Headache 07/23/2022   Cervical disc disorder with radiculopathy of cervical region 06/20/2020   Seasonal allergic rhinitis 03/05/2020   Chronic left shoulder pain 11/27/2019   GERD (gastroesophageal reflux disease) 08/10/2019   Hypertension 08/10/2019   Insomnia 06/09/2019   PICC (peripherally inserted central catheter) in place 09/05/2018   Mixed hyperlipidemia 02/28/2018   Type 2 diabetes mellitus without complications (HCC) 02/25/2018   Tobacco use disorder 04/13/2017   T.T.P. syndrome (HCC) 02/06/2017   Renal insufficiency 02/03/2017   Thrombocytopenia (HCC) 02/01/2017   Diabetes mellitus (HCC) 02/01/2017   Diverticulosis 02/01/2017   Social History   Socioeconomic History   Marital status: Single    Spouse name: Not on file   Number of children: 2   Years of education: Not on file   Highest education level: Not on file  Occupational History   Occupation: location manager   Occupation: unemployed  Tobacco Use   Smoking status: Some Days    Current packs/day: 0.50     Average packs/day: 0.5 packs/day for 15.0 years (7.5 ttl pk-yrs)    Types: Cigarettes    Passive exposure: Current   Smokeless tobacco: Never  Vaping Use   Vaping status: Never Used  Substance and Sexual Activity   Alcohol use: Not Currently    Comment: 3-4 beers per day most days   Drug use: No   Sexual activity: Yes  Other Topics Concern   Not on file  Social History Narrative   ** Merged History Encounter **       Social Drivers of Health   Financial Resource Strain: Not on file  Food Insecurity: Not on file  Transportation Needs: Not on file  Physical Activity: Not on file  Stress: Not on file  Social Connections: Unknown (04/22/2022)   Received from [facility], [facility]   Social Network    Social Network: Not on file  Intimate Partner Violence: Unknown (04/22/2022)   [facility] from [facility], [facility]  HITS    Physically Hurt: Not on file    Insult or Talk Down To: Not on file    Threaten Physical Harm: Not on file    Scream or Curse: Not on file    Mr. Dobosz's family history includes Colon cancer in his maternal aunt; Diabetes type II in his father; Hyperlipidemia in his mother; Hypertension in his mother.      Objective:    Vitals:   05/04/23 0954  BP: 104/80  Pulse: 60    Physical Exam Well-developed well-nourished AA male  in no acute distress.  Height, Weight 193, BMI  Pt here alone today  HEENT; nontraumatic normocephalic, EOMI, PE R LA, sclera anicteric.  Extremities; no clubbing cyanosis or edema skin warm and dry Neuro/Psych; alert and oriented x4, grossly nonfocal mood and affect appropriate        Assessment & Plan:   #2  48 year old African-American male with chronic GERD-not well-controlled even with twice daily Nexium  40 mg. Recent EGD showed a normal-appearing esophagus, no hiatal hernia, biopsy showed no evidence of increased eosinophilia but did have changes consistent with mild reflux esophagitis.  No current  complaints of dysphagia or odynophagia.  #2 history of adenomatous colon polyps up-to-date with colonoscopy just done September 2024 with removal of a 1 cm tubular adenoma.  Indicated for 3-year interval follow-up #3.  History of TTP-followed by Dr. Cloretta recent rituximab  #4 hypertension # 5.  Adult onset diabetes mellitus  Plan; We reviewed strict antireflux regimen, again n.p.o. 3 hours prior to bedtime and elevating the back 45 degrees for sleep We will give him a trial of Aciphex  40 mg p.o( two 20 mg tabs) twice daily AC breakfast and dinner, and also sent a prescription for Pepcid  20 mg to take [facility] lunch. Of asked him to try this for about a month, see if he has better response to Aciphex  the Nexium , if not then we will go back to Nexium  40 mg p.o. twice daily  AC. Will proceed with scheduling for manometry and pH /impedance testing pH study to be done on PPI Patient may be a candidate for TIF Will plan follow-up with Dr. Legrand post above  Udell Mazzocco S Cecilio Ohlrich PA-C 05/04/2023   Cc: Shelda Atlas, MD

## 2023-05-04 NOTE — Patient Instructions (Addendum)
 We have sent the following medications to your pharmacy for you to pick up at your convenience:  Aciphex  - try for two weeks if not as expensive as Nexium  - otherwise go back to Nexium  twice a day.  You have been scheduled for an esophageal manometry test at [facility] on 09/22/2023 at 12:30pm. Please arrive 30 minutes prior to your procedure for registration. You will need to go to outpatient registration (1st floor of the hospital) first. Make certain to bring your insurance cards as well as a complete list of medications.  Please remember the following:  1) Do not take any muscle relaxants, xanax (alprazolam) or ativan  for 1 day prior to your test as well as the day of the test.  2) Nothing to eat or drink after 12:00 midnight on the night before your test.  3) Hold all diabetic medications/insulin  the morning of the test. You may eat and take your medications after the test.  It will take at least 2 weeks to receive the results of this test from your physician.  ------------------------------------------ ABOUT ESOPHAGEAL MANOMETRY Esophageal manometry (muh-NOM-uh-tree) is a test that gauges how well your esophagus works. Your esophagus is the long, muscular tube that connects your throat to your stomach. Esophageal manometry measures the rhythmic muscle contractions (peristalsis) that occur in your esophagus when you swallow. Esophageal manometry also measures the coordination and force exerted by the muscles of your esophagus.  During esophageal manometry, a thin, flexible tube (catheter) that contains sensors is passed through your nose, down your esophagus and into your stomach. Esophageal manometry can be helpful in diagnosing some mostly uncommon disorders that affect your esophagus.  Why it's done Esophageal manometry is used to evaluate the movement (motility) of food through the esophagus and into the stomach. The test measures how well the circular bands of muscle  (sphincters) at the top and bottom of your esophagus open and close, as well as the pressure, strength and pattern of the wave of esophageal muscle contractions that moves food along.  What you can expect Esophageal manometry is an outpatient procedure done without sedation. Most people tolerate it well. You may be asked to change into a hospital gown before the test starts.  During esophageal manometry  While you are sitting up, a member of your health care team sprays your throat with a numbing medication or puts numbing gel in your nose or both.  A catheter is guided through your nose into your esophagus. The catheter may be sheathed in a water-filled sleeve. It doesn't interfere with your breathing. However, your eyes may water, and you may gag. You may have a slight nosebleed from irritation.  After the catheter is in place, you may be asked to lie on your back on an exam table, or you may be asked to remain seated.  You then swallow small sips of water. As you do, a computer connected to the catheter records the pressure, strength and pattern of your esophageal muscle contractions.  During the test, you'll be asked to breathe slowly and smoothly, remain as still as possible, and swallow only when you're asked to do so.  A member of your health care team may move the catheter down into your stomach while the catheter continues its measurements.  The catheter then is slowly withdrawn. The test usually lasts 20 to 30 minutes.  After esophageal manometry  When your esophageal manometry is complete, you may return to your normal activities  This test typically takes  30-45 minutes to complete. ________________________________________________________________________________  Please follow up with Dr. Legrand about one month after this is completed.  Gastroesophageal Reflux Disease, Adult Gastroesophageal reflux (GER) happens when acid from the stomach flows up into the tube that connects the mouth  and the stomach (esophagus). Normally, food travels down the esophagus and stays in the stomach to be digested. However, when a person has GER, food and stomach acid sometimes move back up into the esophagus. If this becomes a more serious problem, the person may be diagnosed with a disease called gastroesophageal reflux disease (GERD). GERD occurs when the reflux: Happens often. Causes frequent or severe symptoms. Causes problems such as damage to the esophagus. When stomach acid comes in contact with the esophagus, the acid may cause inflammation in the esophagus. Over time, GERD may create small holes (ulcers) in the lining of the esophagus. What are the causes? This condition is caused by a problem with the muscle between the esophagus and the stomach (lower esophageal sphincter, or LES). Normally, the LES muscle closes after food passes through the esophagus to the stomach. When the LES is weakened or abnormal, it does not close properly, and that allows food and stomach acid to go back up into the esophagus. The LES can be weakened by certain dietary substances, medicines, and medical conditions, including: Tobacco use. Pregnancy. Having a hiatal hernia. Alcohol use. Certain foods and beverages, such as coffee, chocolate, onions, and peppermint. What increases the risk? You are more likely to develop this condition if you: Have an increased body weight. Have a connective tissue disorder. Take NSAIDs, such as ibuprofen. What are the signs or symptoms? Symptoms of this condition include: Heartburn. Difficult or painful swallowing and the feeling of having a lump in the throat. A bitter taste in the mouth. Bad breath and having a large amount of saliva. Having an upset or bloated stomach and belching. Chest pain. Different conditions can cause chest pain. Make sure you see your health care provider if you experience chest pain. Shortness of breath or wheezing. Ongoing (chronic) cough or  a nighttime cough. Wearing away of tooth enamel. Weight loss. How is this diagnosed? This condition may be diagnosed based on a medical history and a physical exam. To determine if you have mild or severe GERD, your health care provider may also monitor how you respond to treatment. You may also have tests, including: A test to examine your stomach and esophagus with a small camera (endoscopy). A test that measures the acidity level in your esophagus. A test that measures how much pressure is on your esophagus. A barium swallow or modified barium swallow test to show the shape, size, and functioning of your esophagus. How is this treated? Treatment for this condition may vary depending on how severe your symptoms are. Your health care provider may recommend: Changes to your diet. Medicine. Surgery. The goal of treatment is to help relieve your symptoms and to prevent complications. Follow these instructions at home: Eating and drinking  Follow a diet as recommended by your health care provider. This may involve avoiding foods and drinks such as: Coffee and tea, with or without caffeine. Drinks that contain alcohol. Energy drinks and sports drinks. Carbonated drinks or sodas. Chocolate and cocoa. Peppermint and mint flavorings. Garlic and onions. Horseradish. Spicy and acidic foods, including peppers, chili powder, curry powder, vinegar, hot sauces, and barbecue sauce. Citrus fruit juices and citrus fruits, such as oranges, lemons, and limes. Tomato-based foods, such as red sauce,  chili, salsa, and pizza with red sauce. Fried and fatty foods, such as donuts, french fries, potato chips, and high-fat dressings. High-fat meats, such as hot dogs and fatty cuts of red and white meats, such as rib eye steak, sausage, ham, and bacon. High-fat dairy items, such as whole milk, butter, and cream cheese. Eat small, frequent meals instead of large meals. Avoid drinking large amounts of liquid  with your meals. Avoid eating meals during the 2-3 hours before bedtime. Avoid lying down right after you eat. Do not exercise right after you eat. Lifestyle  Do not use any products that contain nicotine  or tobacco. These products include cigarettes, chewing tobacco, and vaping devices, such as e-cigarettes. If you need help quitting, ask your health care provider. Try to reduce your stress by using methods such as yoga or meditation. If you need help reducing stress, ask your health care provider. If you are overweight, reduce your weight to an amount that is healthy for you. Ask your health care provider for guidance about a safe weight loss goal. General instructions Pay attention to any changes in your symptoms. Take over-the-counter and prescription medicines only as told by your health care provider. Do not take aspirin , ibuprofen, or other NSAIDs unless your health care provider told you to take these medicines. Wear loose-fitting clothing. Do not wear anything tight around your waist that causes pressure on your abdomen. Raise (elevate) the head of your bed about 6 inches (15 cm). You can use a wedge to do this. Avoid bending over if this makes your symptoms worse. Keep all follow-up visits. This is important. Contact a health care provider if: You have: New symptoms. Unexplained weight loss. Difficulty swallowing or it hurts to swallow. Wheezing or a persistent cough. A hoarse voice. Your symptoms do not improve with treatment. Get help right away if: You have sudden pain in your arms, neck, jaw, teeth, or back. You suddenly feel sweaty, dizzy, or light-headed. You have chest pain or shortness of breath. You vomit and the vomit is green, yellow, or black, or it looks like blood or coffee grounds. You faint. You have stool that is red, bloody, or black. You cannot swallow, drink, or eat. These symptoms may represent a serious problem that is an emergency. Do not wait to see  if the symptoms will go away. Get medical help right away. Call your local emergency services (911 in the [place] Do not drive yourself to the hospital. Summary Gastroesophageal reflux happens when acid from the stomach flows up into the esophagus. GERD is a disease in which the reflux happens often, causes frequent or severe symptoms, or causes problems such as damage to the esophagus. Treatment for this condition may vary depending on how severe your symptoms are. Your health care provider may recommend diet and lifestyle changes, medicine, or surgery. Contact a health care provider if you have new or worsening symptoms. Take over-the-counter and prescription medicines only as told by your health care provider. Do not take aspirin , ibuprofen, or other NSAIDs unless your health care provider told you to do so. Keep all follow-up visits as told by your health care provider. This is important. This information is not intended to replace advice given to you by your health care provider. Make sure you discuss any questions you have with your health care provider. Document Revised: 10/21/2019 Document Reviewed: 10/23/2019 Elsevier Patient Education  2024 [employer].

## 2023-05-05 ENCOUNTER — Other Ambulatory Visit: Payer: Self-pay

## 2023-05-07 NOTE — Progress Notes (Signed)
 ____________________________________________________________  Attending physician addendum:  Thank you for sending this case to me. I have reviewed the entire note and agree with the plan.  Agree that if further studies discover significant GERD, he may be a good candidate for TIF evaluation.  Victory Brand, MD  ____________________________________________________________

## 2023-05-17 ENCOUNTER — Other Ambulatory Visit: Payer: Medicaid Other

## 2023-05-17 ENCOUNTER — Inpatient Hospital Stay (HOSPITAL_BASED_OUTPATIENT_CLINIC_OR_DEPARTMENT_OTHER): Payer: Medicaid Other

## 2023-05-17 ENCOUNTER — Inpatient Hospital Stay: Payer: Medicaid Other | Attending: Oncology | Admitting: Oncology

## 2023-05-17 VITALS — BP 156/88 | HR 78 | Temp 98.1°F | Resp 16 | Ht 68.0 in | Wt 193.5 lb

## 2023-05-17 DIAGNOSIS — I10 Essential (primary) hypertension: Secondary | ICD-10-CM | POA: Insufficient documentation

## 2023-05-17 DIAGNOSIS — M3119 Other thrombotic microangiopathy: Secondary | ICD-10-CM

## 2023-05-17 DIAGNOSIS — Z720 Tobacco use: Secondary | ICD-10-CM | POA: Diagnosis not present

## 2023-05-17 DIAGNOSIS — E1159 Type 2 diabetes mellitus with other circulatory complications: Secondary | ICD-10-CM | POA: Insufficient documentation

## 2023-05-17 LAB — CBC WITH DIFFERENTIAL (CANCER CENTER ONLY)
Abs Immature Granulocytes: 0.02 10*3/uL (ref 0.00–0.07)
Basophils Absolute: 0 10*3/uL (ref 0.0–0.1)
Basophils Relative: 0 %
Eosinophils Absolute: 0.3 10*3/uL (ref 0.0–0.5)
Eosinophils Relative: 3 %
HCT: 50.2 % (ref 39.0–52.0)
Hemoglobin: 17.8 g/dL — ABNORMAL HIGH (ref 13.0–17.0)
Immature Granulocytes: 0 %
Lymphocytes Relative: 40 %
Lymphs Abs: 3.9 10*3/uL (ref 0.7–4.0)
MCH: 31.5 pg (ref 26.0–34.0)
MCHC: 35.5 g/dL (ref 30.0–36.0)
MCV: 88.8 fL (ref 80.0–100.0)
Monocytes Absolute: 0.7 10*3/uL (ref 0.1–1.0)
Monocytes Relative: 7 %
Neutro Abs: 4.8 10*3/uL (ref 1.7–7.7)
Neutrophils Relative %: 50 %
Platelet Count: 200 10*3/uL (ref 150–400)
RBC: 5.65 MIL/uL (ref 4.22–5.81)
RDW: 12.9 % (ref 11.5–15.5)
WBC Count: 9.7 10*3/uL (ref 4.0–10.5)
nRBC: 0 % (ref 0.0–0.2)

## 2023-05-17 LAB — CMP (CANCER CENTER ONLY)
ALT: 52 U/L — ABNORMAL HIGH (ref 0–44)
AST: 22 U/L (ref 15–41)
Albumin: 4.7 g/dL (ref 3.5–5.0)
Alkaline Phosphatase: 149 U/L — ABNORMAL HIGH (ref 38–126)
Anion gap: 8 (ref 5–15)
BUN: 13 mg/dL (ref 6–20)
CO2: 29 mmol/L (ref 22–32)
Calcium: 9.7 mg/dL (ref 8.9–10.3)
Chloride: 105 mmol/L (ref 98–111)
Creatinine: 0.92 mg/dL (ref 0.61–1.24)
GFR, Estimated: >60 mL/min (ref >60)
Glucose, Bld: 141 mg/dL — ABNORMAL HIGH (ref 70–99)
Potassium: 3.5 mmol/L (ref 3.5–5.1)
Sodium: 142 mmol/L (ref 135–145)
Total Bilirubin: 0.8 mg/dL (ref 0.0–1.2)
Total Protein: 6.9 g/dL (ref 6.5–8.1)

## 2023-05-17 LAB — LACTATE DEHYDROGENASE: LDH: 164 U/L (ref 98–192)

## 2023-05-17 NOTE — Progress Notes (Signed)
[facility] OFFICE PROGRESS NOTE   Diagnosis: TTP  INTERVAL HISTORY:   Eric Hooper returns as scheduled.  He completed a treatment with rituximab on 04/22/2023.  He reports tolerating the rituximab well.  No symptom of an allergic reaction.  No bleeding.  He had an upper respiratory infection 3 weeks ago with sinus congestion and a cough.  The symptoms have improved.  Objective:  Vital signs in last 24 hours:  Blood pressure (!) 156/88, pulse 78, temperature 98.1 F (36.7 C), temperature source Temporal, resp. rate 16, height 5\' 8"  (1.727 m), weight 193 lb 8 oz (87.8 kg), SpO2 100%.    Resp: Lungs clear bilaterally, no respiratory distress Cardio: Grouped beats, regular rhythm GI: No hepatosplenomegaly Vascular: No leg edema  Skin: Hyperpigmented lesions over the trunk appear improved, 3-4 mm nodular cutaneous lesion overlying the right tibia    Lab Results:  Lab Results  Component Value Date   WBC 9.7 05/17/2023   HGB 17.8 (H) 05/17/2023   HCT 50.2 05/17/2023   MCV 88.8 05/17/2023   PLT 200 05/17/2023   NEUTROABS 4.8 05/17/2023    CMP  Lab Results  Component Value Date   NA 142 05/17/2023   K 3.5 05/17/2023   CL 105 05/17/2023   CO2 29 05/17/2023   GLUCOSE 141 (H) 05/17/2023   BUN 13 05/17/2023   CREATININE 0.92 05/17/2023   CALCIUM 9.7 05/17/2023   PROT 6.9 05/17/2023   ALBUMIN 4.7 05/17/2023   AST 22 05/17/2023   ALT 52 (H) 05/17/2023   ALKPHOS 149 (H) 05/17/2023   BILITOT 0.8 05/17/2023   GFRNONAA >60 05/17/2023   GFRAA 125 06/14/2020    Medications: I have reviewed the patient's current medications.   Assessment/Plan:  TTP initially diagnosed in October 2018 with recurrence in April 2020 and July 2023. Initiation of daily plasma exchange and Solu-Medrol 02/02/2017 [name] Antibody- 72, activity less than 2% on initial presentation; [name] from 08/15/2018 <2.0. Last plasma exchange 02/06/2017 Prednisone 60 mg daily beginning  02/08/2017 Prednisone 40 mg daily beginning 02/17/2017 Prednisone 30 mg daily beginning 03/09/2017 Prednisone 20 mg daily beginning 03/23/2017 Prednisone 10 mg daily beginning 04/01/2017 Prednisone 5 mg daily for 5 days then 5 mg every other day for 5 doses then stop beginning 05/11/2017 Recurrent TTP 08/14/2018 Plasma exchange/steroids started on 08/15/2018.  Plasma exchange discontinued after treatment on 08/19/2019, discharged on prednisone 60mg  daily Prednisone taper to 40 mg daily 08/22/2018 Weekly rituximab for 4 doses beginning 08/25/2018 Plasma exchange resumed 08/26/2018, 08/27/2018, 08/28/2018, 08/29/2018, 08/31/2018, 09/02/2018 Prednisone taper to 30 mg daily 09/01/2018 Cycle 3 weekly Rituxan 09/08/2018 Prednisone taper to 20 mg daily 09/08/2018 Cycle 4 weekly Rituxan 09/15/2018 Prednisone taper to 10 mg daily 09/20/2018 Prednisone taper to 5 mg daily 09/27/2018 Prednisone discontinued 10/07/2018 Admission with relapse of TTP 10/23/2021 Daily Plasma exchange initiated 11/23/2021 prednisone 8/1 Prednisone tapered to 40 mg daily beginning 12/04/2021 Weekly Rituxan for 4 doses beginning 12/04/2021 Discontinue plasmapheresis after appointment 12/05/2021 Admitted to [facility] 12/11/2021-plasmapheresis reinitiated, Cablivi started 12/12/2021, prednisone dose currently 80 mg daily Every other day plasma exchange beginning 12/28/2021-completed 01/09/2022 Second weekly Rituxan 12/31/2021 Prednisone taper to 60 mg daily beginning 12/31/2021 Third weekly Rituxan 01/06/2022 Prednisone taper to 40 mg daily beginning 01/07/2022 Fourth weekly rituximab 01/13/2022 Prednisone taper to 30 mg daily 01/13/2022 Prednisone taper to 20 mg daily 01/20/2022 Prednisone taper to 15 mg daily 02/04/2022 Per patient report 02/19/2022 he has continued prednisone 30 mg daily and did not make the above adjustments, he  will decrease prednisone to 20 mg daily today Prednisone taper to 10 mg daily 03/09/2022 Prednisone taper to 5 mg daily  03/26/2022 Prednisone taper to 5 mg every other day for 5 doses beginning 04/16/2022 ADAMTS 13 :14.2 on 04/02/2023, 7.2 on 04/08/2023 04/22/2023-rituximab 2. Xiphoid pain-etiology unclear 3. Hypertension 4. Diabetes 5. Alcohol/Tobacco use 6. History of renal insufficiency 7. Right IJ dialysis catheter placed 02/02/2017, removed New right IJ dialysis catheter 08/15/2018 Catheter removed 09/12/2018 New right IJ dialysis catheter placed 11/23/2021 Temporary hemodialysis catheter converted to tunneled hemodialysis catheter 11/28/2021     Disposition: Eric Hooper appears stable.  He tolerated the rituximab well.  We will follow-up on the [name] activity from today.  He will return for an in lab visit in 4 weeks.  He will complete 3 more treatments with rituximab if the ADAMTS remains low.  The platelet count remains normal and there are no clinical symptoms to suggest clinical relapse of TTP.    Thornton Papas, MD  05/17/2023  2:57 PM

## 2023-05-20 ENCOUNTER — Emergency Department (HOSPITAL_COMMUNITY): Payer: Medicaid Other

## 2023-05-20 ENCOUNTER — Encounter (HOSPITAL_COMMUNITY): Payer: Self-pay

## 2023-05-20 ENCOUNTER — Emergency Department (HOSPITAL_COMMUNITY)
Admission: EM | Admit: 2023-05-20 | Discharge: 2023-05-20 | Discharge status: Against Medical Advice | Payer: Medicaid Other | Attending: Emergency Medicine | Admitting: Emergency Medicine

## 2023-05-20 DIAGNOSIS — Z5321 Procedure and treatment not carried out due to patient leaving prior to being seen by health care provider: Secondary | ICD-10-CM | POA: Insufficient documentation

## 2023-05-20 DIAGNOSIS — Z1152 Encounter for screening for COVID-19: Secondary | ICD-10-CM | POA: Diagnosis not present

## 2023-05-20 DIAGNOSIS — R079 Chest pain, unspecified: Secondary | ICD-10-CM | POA: Diagnosis not present

## 2023-05-20 DIAGNOSIS — R0789 Other chest pain: Secondary | ICD-10-CM | POA: Insufficient documentation

## 2023-05-20 LAB — RESP PANEL BY RT-PCR (RSV, FLU A&B, COVID)  RVPGX2
Influenza A by PCR: NEGATIVE
Influenza B by PCR: NEGATIVE
Resp Syncytial Virus by PCR: NEGATIVE
SARS Coronavirus 2 by RT PCR: NEGATIVE

## 2023-05-20 LAB — CBC
HCT: 51.2 % (ref 39.0–52.0)
Hemoglobin: 18.2 g/dL — ABNORMAL HIGH (ref 13.0–17.0)
MCH: 31.7 pg (ref 26.0–34.0)
MCHC: 35.5 g/dL (ref 30.0–36.0)
MCV: 89 fL (ref 80.0–100.0)
Platelets: 190 10*3/uL (ref 150–400)
RBC: 5.75 MIL/uL (ref 4.22–5.81)
RDW: 12.9 % (ref 11.5–15.5)
WBC: 11.5 10*3/uL — ABNORMAL HIGH (ref 4.0–10.5)
nRBC: 0 % (ref 0.0–0.2)

## 2023-05-20 LAB — COMPREHENSIVE METABOLIC PANEL
ALT: 43 U/L (ref 0–44)
AST: 21 U/L (ref 15–41)
Albumin: 4.2 g/dL (ref 3.5–5.0)
Alkaline Phosphatase: 133 U/L — ABNORMAL HIGH (ref 38–126)
Anion gap: 9 (ref 5–15)
BUN: 10 mg/dL (ref 6–20)
CO2: 24 mmol/L (ref 22–32)
Calcium: 9.4 mg/dL (ref 8.9–10.3)
Chloride: 108 mmol/L (ref 98–111)
Creatinine, Ser: 0.83 mg/dL (ref 0.61–1.24)
GFR, Estimated: >60 mL/min (ref >60)
Glucose, Bld: 88 mg/dL (ref 70–99)
Potassium: 3.6 mmol/L (ref 3.5–5.1)
Sodium: 141 mmol/L (ref 135–145)
Total Bilirubin: 1.4 mg/dL — ABNORMAL HIGH (ref 0.0–1.2)
Total Protein: 6.9 g/dL (ref 6.5–8.1)

## 2023-05-20 LAB — BRAIN NATRIURETIC PEPTIDE: B Natriuretic Peptide: 31.6 pg/mL (ref 0.0–100.0)

## 2023-05-20 LAB — TROPONIN I (HIGH SENSITIVITY)
Troponin I (High Sensitivity): 9 ng/L (ref <18)
Troponin I (High Sensitivity): 9 ng/L (ref <18)

## 2023-05-20 LAB — ADAMTS13 ACTIVITY REFLEX

## 2023-05-20 LAB — LIPASE, BLOOD: Lipase: 37 U/L (ref 11–51)

## 2023-05-20 LAB — ADAMTS13 ACTIVITY: Adamts 13 Activity: 54.6 % — ABNORMAL LOW (ref >66.8)

## 2023-05-20 MED ORDER — PANTOPRAZOLE SODIUM 20 MG PO TBEC
20.0000 mg | DELAYED_RELEASE_TABLET | Freq: Every day | ORAL | Status: DC
  Administered 2023-05-20: 20 mg via ORAL
  Filled 2023-05-20: qty 1

## 2023-05-20 NOTE — ED Provider Triage Note (Signed)
Emergency Medicine Provider Triage Evaluation Note  Eric Hooper , a 48 y.o. male  was evaluated in triage.  Pt complains of central chest pain that started last night.  Pain persist the night and today.  Patient presented to urgent care and referred to the emergency department.  He tried an acid last night thinking it might be reflux with no relief.  He has had URI cold symptoms for a few weeks.  Denies lower extremity swelling or calf pain.  Patient has TTP, last rituximab therapy 12\26\2024  Review of Systems  Positive: Central chest pain Negative: Fever  Physical Exam  BP (!) 145/77 (BP Location: Left Arm)   Pulse 66   Temp 98.4 F (36.9 C) (Oral)   Resp 15   SpO2 100%  Gen:   Awake, no distress   Resp:  Normal effort clear to auscultation MSK:   Moves extremities without difficulty no calf tenderness Other:  Alert nontoxic well in appearance  Medical Decision Making  Medically screening exam initiated at 7:40 PM.  Appropriate orders placed.  Eric Hooper was informed that the remainder of the evaluation will be completed by another provider, this initial triage assessment does not replace that evaluation, and the importance of remaining in the ED until their evaluation is complete.     Arby Barrette, MD 05/20/23 [phone]

## 2023-05-20 NOTE — ED Triage Notes (Signed)
Mid chest pain since yesterday. Hx of GERD. Seen at [facility] today and sent here for further eval.

## 2023-05-20 NOTE — ED Notes (Signed)
Patient left.

## 2023-05-21 ENCOUNTER — Telehealth: Payer: Self-pay

## 2023-05-21 ENCOUNTER — Emergency Department (HOSPITAL_BASED_OUTPATIENT_CLINIC_OR_DEPARTMENT_OTHER)
Admission: EM | Admit: 2023-05-21 | Discharge: 2023-05-21 | Disposition: A | Discharge status: Home / Self Care | Payer: Medicaid Other | Attending: Emergency Medicine | Admitting: Emergency Medicine

## 2023-05-21 ENCOUNTER — Other Ambulatory Visit: Payer: Self-pay

## 2023-05-21 ENCOUNTER — Encounter (HOSPITAL_BASED_OUTPATIENT_CLINIC_OR_DEPARTMENT_OTHER): Payer: Self-pay | Admitting: Emergency Medicine

## 2023-05-21 DIAGNOSIS — Z79899 Other long term (current) drug therapy: Secondary | ICD-10-CM | POA: Insufficient documentation

## 2023-05-21 DIAGNOSIS — Z7982 Long term (current) use of aspirin: Secondary | ICD-10-CM | POA: Insufficient documentation

## 2023-05-21 DIAGNOSIS — R0789 Other chest pain: Secondary | ICD-10-CM | POA: Insufficient documentation

## 2023-05-21 DIAGNOSIS — R079 Chest pain, unspecified: Secondary | ICD-10-CM | POA: Diagnosis present

## 2023-05-21 DIAGNOSIS — I10 Essential (primary) hypertension: Secondary | ICD-10-CM | POA: Diagnosis not present

## 2023-05-21 DIAGNOSIS — E119 Type 2 diabetes mellitus without complications: Secondary | ICD-10-CM | POA: Diagnosis not present

## 2023-05-21 LAB — BASIC METABOLIC PANEL
Anion gap: 9 (ref 5–15)
BUN: 15 mg/dL (ref 6–20)
CO2: 28 mmol/L (ref 22–32)
Calcium: 9.5 mg/dL (ref 8.9–10.3)
Chloride: 102 mmol/L (ref 98–111)
Creatinine, Ser: 0.99 mg/dL (ref 0.61–1.24)
GFR, Estimated: >60 mL/min (ref >60)
Glucose, Bld: 112 mg/dL — ABNORMAL HIGH (ref 70–99)
Potassium: 3.8 mmol/L (ref 3.5–5.1)
Sodium: 139 mmol/L (ref 135–145)

## 2023-05-21 LAB — TROPONIN I (HIGH SENSITIVITY): Troponin I (High Sensitivity): 8 ng/L (ref <18)

## 2023-05-21 LAB — CBC
HCT: 50.5 % (ref 39.0–52.0)
Hemoglobin: 17.8 g/dL — ABNORMAL HIGH (ref 13.0–17.0)
MCH: 31.8 pg (ref 26.0–34.0)
MCHC: 35.2 g/dL (ref 30.0–36.0)
MCV: 90.2 fL (ref 80.0–100.0)
Platelets: 176 10*3/uL (ref 150–400)
RBC: 5.6 MIL/uL (ref 4.22–5.81)
RDW: 13.1 % (ref 11.5–15.5)
WBC: 10 10*3/uL (ref 4.0–10.5)
nRBC: 0 % (ref 0.0–0.2)

## 2023-05-21 LAB — MAGNESIUM: Magnesium: 2.1 mg/dL (ref 1.7–2.4)

## 2023-05-21 MED ORDER — ALUM & MAG HYDROXIDE-SIMETH 200-200-20 MG/5ML PO SUSP
30.0000 mL | Freq: Once | ORAL | Status: AC
  Administered 2023-05-21: 30 mL via ORAL
  Filled 2023-05-21: qty 30

## 2023-05-21 MED ORDER — ACETAMINOPHEN 500 MG PO TABS
1000.0000 mg | ORAL_TABLET | Freq: Once | ORAL | Status: AC
  Administered 2023-05-21: 1000 mg via ORAL
  Filled 2023-05-21: qty 2

## 2023-05-21 MED ORDER — FAMOTIDINE 20 MG PO TABS
20.0000 mg | ORAL_TABLET | Freq: Once | ORAL | Status: AC
  Administered 2023-05-21: 20 mg via ORAL
  Filled 2023-05-21: qty 1

## 2023-05-21 NOTE — Discharge Instructions (Signed)
You were seen today for chest pain.  Your x-ray and lab work are reassuring.  Cardiology will call you to schedule follow-up.  Recommend he follow-up with your primary care doctor as well.  You should take Nexium daily for the next 2 weeks to see if this helps with your symptoms.  If you develop worsening pain, difficulty breathing, fainting or any other new concerning symptoms you should return to the ED.

## 2023-05-21 NOTE — ED Provider Notes (Signed)
EMERGENCY DEPARTMENT AT [facility] Provider Note   CSN: [id number] Arrival date & time: 05/21/23  1349     History  Chief Complaint  Patient presents with   Chest Pain    Eric Hooper is a 48 y.o. male.   Chest Pain 48 year old male history of type 2 diabetes, hyperlipidemia, diabetes, GERD, hypertension, TTP presenting for chest pain.  Started 2 days ago.  He ate some barbecue at home and walk to the bathroom.  He then developed some burning substernal chest pain.  Nonradiating.  He took some Nexium and went to bed.  Pain is worse in the morning.  He takes Nexium as needed but not regularly.  Initially felt quite like heartburn.  He is actually better with movement, nonexertional.  No shortness of breath or pleuritic component.  No history of DVT or PE or recent travel or leg swelling.  He is otherwise been his baseline health.  Pain is nearly resolved at this point.  He went to [facility] yesterday, had a chest x-ray and lab work which were reassuring but left prior to being seen due to wait.  He returns today for further evaluation.  No new symptoms.  No fevers or chills or cough.  He is otherwise been at his baseline health.     Home Medications Prior to Admission medications   Medication Sig Start Date End Date Taking? Authorizing Provider  acetaminophen (TYLENOL) 325 MG tablet Take 2 tablets (650 mg total) by mouth every 4 (four) hours as needed (discomfort or fever). 11/29/21   Jerre Simon, MD  amLODipine (NORVASC) 10 MG tablet Take 1 tablet (10 mg total) by mouth at bedtime. Patient taking differently: Take 10 mg by mouth daily. 11/09/22   Caro Laroche, DO  aspirin EC 81 MG tablet Take 1 tablet (81 mg total) by mouth daily. Swallow whole. 11/26/22   Maisie Fus, MD  atorvastatin (LIPITOR) 40 MG tablet TAKE 1 TABLET BY MOUTH EVERY DAY 09/14/22   Lilland, Alana, DO  benzoyl peroxide 5 % gel Apply topically 2 (two) times daily. Patient not taking:  Reported on 05/17/2023 02/23/22   Lilland, Alana, DO  blood glucose meter kit and supplies KIT Dispense based on patient and insurance preference. Use up to four times daily as directed. (FOR ICD-9 250.00, 250.01). 08/25/18   Garnette Gunner, MD  blood glucose meter kit and supplies Dispense based on patient and insurance preference. Use up to four times daily as directed. (FOR ICD-10 E10.9, E11.9). 09/02/18   Garnette Gunner, MD  empagliflozin (JARDIANCE) 10 MG TABS tablet Take 1 tablet (10 mg total) by mouth daily. 03/04/23   Thapa, [place], MD  EPINEPHrine 0.3 mg/0.3 mL IJ SOAJ injection Inject 0.3 mg into the muscle as needed for anaphylaxis. Patient not taking: Reported on 05/17/2023 08/13/22   Carlisle Beers, FNP  erythromycin with ethanol (EMGEL) 2 % gel Apply daily to affected areas Patient not taking: Reported on 05/17/2023 10/08/22   Terri Piedra, DO  esomeprazole (NEXIUM) 40 MG capsule Take 1 capsule (40 mg total) by mouth 2 (two) times daily before a meal. 01/01/23   Esterwood, Amy S, PA-C  losartan (COZAAR) 100 MG tablet Take 1 tablet (100 mg total) by mouth at bedtime. 11/09/22   Caro Laroche, DO  Multiple Vitamin (MULTIVITAMIN) capsule Take 1 capsule by mouth daily.    [provider]  nicotine (NICODERM CQ - DOSED IN MG/24 HOURS) 21 mg/24hr patch Place  21 mg onto the skin daily. 04/29/23   [provider]  potassium chloride (KLOR-CON) 10 MEQ tablet Take 10 mEq by mouth daily. 01/04/23   [provider]  RABEprazole (ACIPHEX) 20 MG tablet Take 2 tablets (40 mg total) by mouth in the morning and at bedtime. Patient not taking: Reported on 05/17/2023 05/04/23   Esterwood, Amy S, PA-C  tretinoin (RETIN-A) 0.05 % cream Apply topically at bedtime. USE EVERY OTHER NIGHT FOR 1 MONTH, AFTER ONE MONTH INCREASE USE TO NIGHTLY Patient not taking: Reported on 05/17/2023 10/08/22 10/08/23  Terri Piedra, DO      Allergies    Bee venom and Ibuprofen    Review of  Systems   Review of Systems  Cardiovascular:  Positive for chest pain.  Review of systems completed and notable as per HPI.  ROS otherwise negative.   Physical Exam Updated Vital Signs BP 130/89   Pulse 63   Temp 97.9 F (36.6 C)   Resp 17   SpO2 98%  Physical Exam Vitals and nursing note reviewed.  Constitutional:      General: He is not in acute distress.    Appearance: He is well-developed.  HENT:     Head: Normocephalic and atraumatic.     Mouth/Throat:     Mouth: Mucous membranes are moist.     Pharynx: Oropharynx is clear.  Eyes:     Extraocular Movements: Extraocular movements intact.     Conjunctiva/sclera: Conjunctivae normal.     Pupils: Pupils are equal, round, and reactive to light.  Cardiovascular:     Rate and Rhythm: Normal rate and regular rhythm.     Pulses: Normal pulses.     Heart sounds: Normal heart sounds. No murmur heard. Pulmonary:     Effort: Pulmonary effort is normal. No respiratory distress.     Breath sounds: Normal breath sounds.  Abdominal:     Palpations: Abdomen is soft.     Tenderness: There is no abdominal tenderness.  Musculoskeletal:        General: No swelling.     Cervical back: Neck supple.     Right lower leg: No edema.     Left lower leg: No edema.  Skin:    General: Skin is warm and dry.     Capillary Refill: Capillary refill takes less than 2 seconds.  Neurological:     Mental Status: He is alert.  Psychiatric:        Mood and Affect: Mood normal.     ED Results / Procedures / Treatments   Labs (all labs ordered are listed, but only abnormal results are displayed) Labs Reviewed  BASIC METABOLIC PANEL - Abnormal; Notable for the following components:      Result Value   Glucose, Bld 112 (*)    All other components within normal limits  CBC - Abnormal; Notable for the following components:   Hemoglobin 17.8 (*)    All other components within normal limits  MAGNESIUM  TROPONIN I (HIGH SENSITIVITY)  TROPONIN I  (HIGH SENSITIVITY)    EKG EKG Interpretation Date/Time:  Friday May 21 2023 14:01:17 EST Ventricular Rate:  72 PR Interval:  138 QRS Duration:  96 QT Interval:  402 QTC Calculation: 440 R Axis:   50  Text Interpretation: Sinus rhythm with occasional Premature ventricular complexes Possible Lateral infarct , age undetermined Abnormal ECG When compared with ECG of 20-May-2023 19:11, No significant change since last tracing Confirmed by Linwood Dibbles [phone]) on 05/21/2023  2:13:12 PM  Radiology DG Chest 2 View Result Date: 05/20/2023 CLINICAL DATA:  Chest pain EXAM: CHEST - 2 VIEW COMPARISON:  None Available. FINDINGS: The heart size and mediastinal contours are within normal limits. Both lungs are clear. The visualized skeletal structures are unremarkable. IMPRESSION: No active cardiopulmonary disease. Electronically Signed   By: Helyn Numbers M.D.   On: 05/20/2023 19:25    Procedures Procedures    Medications Ordered in ED Medications  acetaminophen (TYLENOL) tablet 1,000 mg (1,000 mg Oral Given 05/21/23 1659)  famotidine (PEPCID) tablet 20 mg (20 mg Oral Given 05/21/23 1659)  alum & mag hydroxide-simeth (MAALOX/MYLANTA) 200-200-20 MG/5ML suspension 30 mL (30 mLs Oral Given 05/21/23 1700)    ED Course/ Medical Decision Making/ A&P                                  Medical Decision Making Amount and/or Complexity of Data Reviewed Labs: ordered.  Risk OTC drugs.   Medical Decision Making:   Mickal Meno is a 48 y.o. male who presented to the ED today with chest pain.  Vital signs reviewed.  Chest pain is somewhat atypical, better with exertion and movement and sounds more like GERD.  Will treat with Tylenol, Pepcid, Maalox and reassess.  He had a troponin 24 hours ago which is normal and troponin here which is normal as well, EKG has some PVCs.  Will check magnesium, potassium is normal.  I do not see any ischemic changes on EKG.  He is PERC negative low special for PE.  Low  suspicion for dissection based on history, exam.   Patient placed on continuous vitals and telemetry monitoring while in ED which was reviewed periodically.  Reviewed and confirmed nursing documentation for past medical history, family history, social history.  Reassessment and Plan:   Pain improved with GERD treatment.  Workup is reassuring.  Suspect GERD versus musculoskeletal pain.  Recommend cardiology follow-up given PVCs.  Return precautions given.  Patient is comfortable with this plan.   Patient's presentation is most consistent with acute complicated illness / injury requiring diagnostic workup.           Final Clinical Impression(s) / ED Diagnoses Final diagnoses:  Chest pain, unspecified type    Rx / DC Orders ED Discharge Orders          Ordered    Ambulatory referral to Cardiology       Comments: If you have not heard from the Cardiology office within the next 72 hours please call [phone].   05/21/23 1741              Laurence Spates, MD 05/21/23 2307

## 2023-05-21 NOTE — ED Notes (Signed)
Called lab to add mag level, spoke with Fayrene Fearing.

## 2023-05-21 NOTE — Telephone Encounter (Signed)
I contacted the patient to provide the results of his [name] activity. I understand that he visited the emergency department earlier this morning but left before being seen by a provider regarding his chest pain. He mentioned that he waited for 13 hours and felt exhausted, which led him to leave. I advised the patient to return to the emergency department to complete his workup, as he continues to experience chest pain. I recommended that he come to the [facility] emergency department for evaluation, as the wait times are shorter than at [facility] or [place]. He indicated that he would return for further evaluation.

## 2023-05-21 NOTE — ED Triage Notes (Signed)
Chest pain x 3 days mid sternal. Worse yesterday  Left [facility] due to wait time Cxr and blood work done yesterday

## 2023-05-24 ENCOUNTER — Telehealth: Payer: Self-pay

## 2023-05-24 NOTE — Telephone Encounter (Signed)
Pharmacy Patient Advocate Encounter   Received notification from CoverMyMeds that prior authorization for RABEprazole Sodium 20MG  dr tablets is required/requested.   Insurance verification completed.   The patient is insured through [facility] .   Per test claim: PA required; PA started via CoverMyMeds. KEY [id number] . Waiting for clinical questions to populate.

## 2023-05-26 ENCOUNTER — Telehealth: Payer: Self-pay | Admitting: *Deleted

## 2023-05-26 NOTE — Telephone Encounter (Signed)
Called with concern that his platelet count on 1/23 was 190 and went down to 176 following day. Informed him this is still normal and counts can fluctuate depending on lab used, inflammation, meds. He should not be concerned. Will recheck on 2/21.

## 2023-06-03 ENCOUNTER — Ambulatory Visit: Payer: Medicaid Other | Admitting: Dermatology

## 2023-06-03 ENCOUNTER — Encounter: Payer: Self-pay | Admitting: Dermatology

## 2023-06-03 VITALS — BP 135/90 | HR 47

## 2023-06-03 DIAGNOSIS — L739 Follicular disorder, unspecified: Secondary | ICD-10-CM

## 2023-06-03 MED ORDER — DOXYCYCLINE HYCLATE 100 MG PO TABS: 100.0000 mg | ORAL_TABLET | Freq: Every day | ORAL | 6 refills | Status: DC

## 2023-06-03 MED ORDER — CLINDAMYCIN PHOSPHATE 1 % EX LOTN: TOPICAL_LOTION | Freq: Every day | CUTANEOUS | 5 refills | Status: DC

## 2023-06-03 MED ORDER — TAZAROTENE 0.1 % EX FOAM: 1.0000 | CUTANEOUS | 6 refills | Status: DC

## 2023-06-03 NOTE — Patient Instructions (Addendum)
 Hello Eric Hooper,  Thank you for visiting us  today. Here is a summary of the key instructions from today's consultation:  Medications Prescribed:   Tazarotene  Foam: Apply every other night. If dryness occurs, use every two days instead. Ensure to apply a small amount, rub it in thoroughly, then follow with lotion.   Clindamycin : Apply every morning.   Doxycycline : Take one tablet twice a day for five days during flare-ups.   Tretinoin  Cream (for face): Use a pea-sized amount at night.  Skincare Routine:   Morning: Wash your face and apply clindamycin .    Night: Apply tretinoin  cream on your face (not the foam).   Sunscreen: Use during the summer to prevent dark spots.  Additional Products:   Mela-B3 Serum: Use on spots as directed.  Follow-Up:   We will see you again at the end of July or early August to assess progress.  Special Instructions:   The tazarotene  foam will be sent from a special pharmacy called [facility]. They will contact you to verify your address and co-pay details.   If the tazarotene  foam causes dryness, adjust the frequency of application and ensure to moisturize.  Please follow these instructions carefully and do not hesitate to contact our office if you have any questions or concerns before your next visit.  Best regards,  Dr. Delon [facility]    Important Information   Due to recent changes in healthcare laws, you may see results of your pathology and/or laboratory studies on MyChart before the doctors have had a chance to review them. We understand that in some cases there may be results that are confusing or concerning to you. Please understand that not all results are received at the same time and often the doctors may need to interpret multiple results in order to provide you with the best plan of care or course of treatment. Therefore, we ask that you please give us  2 business days to thoroughly review all your results before contacting the office for  clarification. Should we see a critical lab result, you will be contacted sooner.     If You Need Anything After Your Visit   If you have any questions or concerns for your doctor, please call our main line at [phone]. If no one answers, please leave a voicemail as directed and we will return your call as soon as possible. Messages left after 4 pm will be answered the following business day.    You may also send us  a message via MyChart. We typically respond to MyChart messages within 1-2 business days.  For prescription refills, please ask your pharmacy to contact our office. Our fax number is [phone].  If you have an urgent issue when the clinic is closed that cannot wait until the next business day, you can page your doctor at the number below.     Please note that while we do our best to be available for urgent issues outside of office hours, we are not available 24/7.    If you have an urgent issue and are unable to reach us , you may choose to seek medical care at your doctor's office, retail clinic, urgent care center, or emergency room.   If you have a medical emergency, please immediately call 911 or go to the emergency department. In the event of inclement weather, please call our main line at [phone] for an update on the status of any delays or closures.  Dermatology Medication Tips: Please keep the boxes that topical  medications come in in order to help keep track of the instructions about where and how to use these. Pharmacies typically print the medication instructions only on the boxes and not directly on the medication tubes.   If your medication is too expensive, please contact our office at [phone] or send us  a message through MyChart.    We are unable to tell what your co-pay for medications will be in advance as this is different depending on your insurance coverage. However, we may be able to find a substitute medication at lower cost or fill out  paperwork to get insurance to cover a needed medication.    If a prior authorization is required to get your medication covered by your insurance company, please allow us  1-2 business days to complete this process.   Drug prices often vary depending on where the prescription is filled and some pharmacies may offer cheaper prices.   The website www.goodrx.com contains coupons for medications through different pharmacies. The prices here do not account for what the cost may be with help from insurance (it may be cheaper with your insurance), but the website can give you the price if you did not use any insurance.  - You can print the associated coupon and take it with your prescription to the pharmacy.  - You may also stop by our office during regular business hours and pick up a GoodRx coupon card.  - If you need your prescription sent electronically to a different pharmacy, notify our office through [facility] or by phone at [phone]

## 2023-06-03 NOTE — Progress Notes (Signed)
   Follow-Up Visit   Subjective  Eric Hooper is a 48 y.o. male who presents for the following: Folliculitis  Patient present today for follow up visit for Folliculitis. Patient was last evaluated on 12/31/22. At this visit he was recommended to continue Erythromycin  gel and Cerave, Tretinoin  and Doxycycline  along with CeraVe BPO Wash. Patient reports sxs are unchanged. Patient denies medication changes.  The following portions of the chart were reviewed this encounter and updated as appropriate: medications, allergies, medical history  Review of Systems:  No other skin or systemic complaints except as noted in HPI or Assessment and Plan.  Objective  Well appearing patient in no apparent distress; mood and affect are within normal limits.  A focused examination was performed of the following areas: Upper Body  Relevant exam findings are noted in the Assessment and Plan.           Assessment & Plan   FOLLICULITIS Exam: Perifollicular erythematous papules and pustules  Folliculitis occurs due to inflammation of the superficial hair follicle (pore), resulting in acne-like lesions (pus bumps). It can be infectious (bacterial, fungal) or noninfectious (shaving, tight clothing, heat/sweat, medications).  Folliculitis can be acute or chronic and recommended treatment depends on the underlying cause of folliculitis.  Treatment Plan: - We will prescribe Clindamycin  lotion every morning to apply every morning - We will prescribe Tazarotene  Foam to apply 3 nights weekly, if he experiencing dryness advised to cut back to 2 times daily - We will refill Doxycycline  100 mg to take once daily for 5 days at the time of a flare - We will plane to reassess in July FOLLICULITIS   Related Medications tretinoin  (RETIN-A ) 0.05 % cream Apply topically at bedtime. USE EVERY OTHER NIGHT FOR 1 MONTH, AFTER ONE MONTH INCREASE USE TO NIGHTLY  Return in about 6 months (around 12/01/2023) for Folliculitis  F/U.  Documentation: I have reviewed the above documentation for accuracy and completeness, and I agree with the above.  I, Jetta Ager, am acting as scribe for Delon Lenis, DO.  Delon Lenis, DO

## 2023-06-04 ENCOUNTER — Other Ambulatory Visit: Payer: Self-pay

## 2023-06-13 ENCOUNTER — Other Ambulatory Visit: Payer: Self-pay

## 2023-06-13 ENCOUNTER — Encounter (HOSPITAL_COMMUNITY): Payer: Self-pay | Admitting: *Deleted

## 2023-06-13 ENCOUNTER — Emergency Department (HOSPITAL_COMMUNITY)
Admission: EM | Admit: 2023-06-13 | Discharge: 2023-06-14 | Disposition: A | Discharge status: Home / Self Care | Payer: Medicaid Other | Attending: Student | Admitting: Student

## 2023-06-13 DIAGNOSIS — F17210 Nicotine dependence, cigarettes, uncomplicated: Secondary | ICD-10-CM | POA: Insufficient documentation

## 2023-06-13 DIAGNOSIS — I10 Essential (primary) hypertension: Secondary | ICD-10-CM | POA: Insufficient documentation

## 2023-06-13 DIAGNOSIS — R42 Dizziness and giddiness: Secondary | ICD-10-CM | POA: Diagnosis not present

## 2023-06-13 DIAGNOSIS — R0789 Other chest pain: Secondary | ICD-10-CM | POA: Insufficient documentation

## 2023-06-13 DIAGNOSIS — R519 Headache, unspecified: Secondary | ICD-10-CM | POA: Insufficient documentation

## 2023-06-13 DIAGNOSIS — E119 Type 2 diabetes mellitus without complications: Secondary | ICD-10-CM | POA: Insufficient documentation

## 2023-06-13 DIAGNOSIS — Z7982 Long term (current) use of aspirin: Secondary | ICD-10-CM | POA: Diagnosis not present

## 2023-06-13 DIAGNOSIS — Z79899 Other long term (current) drug therapy: Secondary | ICD-10-CM | POA: Diagnosis not present

## 2023-06-13 DIAGNOSIS — M791 Myalgia, unspecified site: Secondary | ICD-10-CM | POA: Insufficient documentation

## 2023-06-13 LAB — CBC
HCT: 49.3 % (ref 39.0–52.0)
Hemoglobin: 17.2 g/dL — ABNORMAL HIGH (ref 13.0–17.0)
MCH: 31.2 pg (ref 26.0–34.0)
MCHC: 34.9 g/dL (ref 30.0–36.0)
MCV: 89.3 fL (ref 80.0–100.0)
Platelets: 188 10*3/uL (ref 150–400)
RBC: 5.52 MIL/uL (ref 4.22–5.81)
RDW: 13.2 % (ref 11.5–15.5)
WBC: 9.6 10*3/uL (ref 4.0–10.5)
nRBC: 0 % (ref 0.0–0.2)

## 2023-06-13 LAB — COMPREHENSIVE METABOLIC PANEL
ALT: 33 U/L (ref 0–44)
AST: 25 U/L (ref 15–41)
Albumin: 4 g/dL (ref 3.5–5.0)
Alkaline Phosphatase: 122 U/L (ref 38–126)
Anion gap: 9 (ref 5–15)
BUN: 16 mg/dL (ref 6–20)
CO2: 25 mmol/L (ref 22–32)
Calcium: 9.6 mg/dL (ref 8.9–10.3)
Chloride: 107 mmol/L (ref 98–111)
Creatinine, Ser: 1.1 mg/dL (ref 0.61–1.24)
GFR, Estimated: >60 mL/min (ref >60)
Glucose, Bld: 144 mg/dL — ABNORMAL HIGH (ref 70–99)
Potassium: 3.7 mmol/L (ref 3.5–5.1)
Sodium: 141 mmol/L (ref 135–145)
Total Bilirubin: 1.3 mg/dL — ABNORMAL HIGH (ref 0.0–1.2)
Total Protein: 6.5 g/dL (ref 6.5–8.1)

## 2023-06-13 LAB — URINALYSIS, ROUTINE W REFLEX MICROSCOPIC
Bacteria, UA: NONE SEEN
Bilirubin Urine: NEGATIVE
Glucose, UA: >=500 mg/dL — AB
Hgb urine dipstick: NEGATIVE
Ketones, ur: NEGATIVE mg/dL
Leukocytes,Ua: NEGATIVE
Nitrite: NEGATIVE
Protein, ur: NEGATIVE mg/dL
Specific Gravity, Urine: 1.013 (ref 1.005–1.030)
pH: 6 (ref 5.0–8.0)

## 2023-06-13 LAB — LIPASE, BLOOD: Lipase: 58 U/L — ABNORMAL HIGH (ref 11–51)

## 2023-06-13 NOTE — ED Triage Notes (Signed)
The pt reports headache body aches chest discomfort  unknown temp   swaeting  since friday

## 2023-06-14 ENCOUNTER — Emergency Department (HOSPITAL_COMMUNITY): Payer: Medicaid Other

## 2023-06-14 LAB — RESP PANEL BY RT-PCR (RSV, FLU A&B, COVID)  RVPGX2
Influenza A by PCR: NEGATIVE
Influenza B by PCR: NEGATIVE
Resp Syncytial Virus by PCR: NEGATIVE
SARS Coronavirus 2 by RT PCR: NEGATIVE

## 2023-06-14 LAB — TROPONIN I (HIGH SENSITIVITY): Troponin I (High Sensitivity): 8 ng/L (ref <18)

## 2023-06-14 MED ORDER — LIDOCAINE 5 % EX PTCH
1.0000 | MEDICATED_PATCH | CUTANEOUS | Status: DC
  Administered 2023-06-14: 1 via TRANSDERMAL
  Filled 2023-06-14: qty 1

## 2023-06-14 MED ORDER — ACETAMINOPHEN 500 MG PO TABS
1000.0000 mg | ORAL_TABLET | Freq: Once | ORAL | Status: AC
  Administered 2023-06-14: 1000 mg via ORAL
  Filled 2023-06-14: qty 2

## 2023-06-14 MED ORDER — ALUM & MAG HYDROXIDE-SIMETH 200-200-20 MG/5ML PO SUSP
30.0000 mL | Freq: Once | ORAL | Status: AC
  Administered 2023-06-14: 30 mL via ORAL
  Filled 2023-06-14: qty 30

## 2023-06-14 MED ORDER — LIDOCAINE 5 % EX PTCH: 1.0000 | MEDICATED_PATCH | CUTANEOUS | 0 refills | Status: DC

## 2023-06-14 MED ORDER — LACTATED RINGERS IV BOLUS
1000.0000 mL | Freq: Once | INTRAVENOUS | Status: AC
  Administered 2023-06-14: 1000 mL via INTRAVENOUS

## 2023-06-14 MED ORDER — LIDOCAINE VISCOUS HCL 2 % MT SOLN
15.0000 mL | Freq: Once | OROMUCOSAL | Status: AC
  Administered 2023-06-14: 15 mL via ORAL
  Filled 2023-06-14: qty 15

## 2023-06-14 MED ORDER — DICLOFENAC SODIUM 1 % EX GEL: 4.0000 g | Freq: Four times a day (QID) | CUTANEOUS | 0 refills | Status: DC

## 2023-06-14 NOTE — ED Provider Notes (Signed)
[place] EMERGENCY DEPARTMENT AT [facility] Provider Note  CSN: [id number] Arrival date & time: 06/13/23 2052  Chief Complaint(s) Headache and Generalized Body Aches  HPI Eric Hooper is a 48 y.o. male with PMH TTP, GERD, HTN who presents emergency department for evaluation of [place] complaints including generalized myalgias, chest tightness and headache.  States that symptoms have been progressively worsening over the last 2 days.  States that he is primarily concerned that he may be having a stroke given some intermittent dizziness and intermittent generalized lightheadedness.  Denies associated abdominal pain, nausea, vomiting, diarrhea or other systemic symptoms.  Denies numbness, tingling, weakness or other neurologic complaints currently in the ER.   Past Medical History Past Medical History:  Diagnosis Date   At risk for dysfunction of heart 07/11/2018   Chest wall pain 08/14/2018   Controlled diabetes mellitus type 2 with complications (HCC) 02/25/2018   Diabetes mellitus without complication (HCC)    non-insulin dependent   Diverticulosis 02/01/2017   on CT scan abd/pelvis   GERD (gastroesophageal reflux disease)    Hypertension    Kidney stones 02/03/2017   Leg swelling 05/12/2017   Macrocytic anemia 02/01/2017   Microscopic hematuria 02/01/2017   Mixed hyperlipidemia 02/28/2018   Phlebitis 05/12/2017   Renal insufficiency 02/03/2017   Smoker 04/13/2017   Patient Active Problem List   Diagnosis Date Noted   Headache 07/23/2022   Cervical disc disorder with radiculopathy of cervical region 06/20/2020   Seasonal allergic rhinitis 03/05/2020   Chronic left shoulder pain 11/27/2019   GERD (gastroesophageal reflux disease) 08/10/2019   Hypertension 08/10/2019   Insomnia 06/09/2019   PICC (peripherally inserted central catheter) in place 09/05/2018   Mixed hyperlipidemia 02/28/2018   Type 2 diabetes mellitus without complications (HCC) 02/25/2018    Tobacco use disorder 04/13/2017   T.T.P. syndrome (HCC) 02/06/2017   Renal insufficiency 02/03/2017   Thrombocytopenia (HCC) 02/01/2017   Diabetes mellitus (HCC) 02/01/2017   Diverticulosis 02/01/2017   Home Medication(s) Prior to Admission medications   Medication Sig Start Date End Date Taking? Authorizing Provider  acetaminophen (TYLENOL) 325 MG tablet Take 2 tablets (650 mg total) by mouth every 4 (four) hours as needed (discomfort or fever). 11/29/21   Jerre Simon, MD  amLODipine (NORVASC) 10 MG tablet Take 1 tablet (10 mg total) by mouth at bedtime. Patient taking differently: Take 10 mg by mouth daily. 11/09/22   Caro Laroche, DO  aspirin EC 81 MG tablet Take 1 tablet (81 mg total) by mouth daily. Swallow whole. 11/26/22   Maisie Fus, MD  atorvastatin (LIPITOR) 40 MG tablet TAKE 1 TABLET BY MOUTH EVERY DAY 09/14/22   Lilland, Alana, DO  benzoyl peroxide 5 % gel Apply topically 2 (two) times daily. 02/23/22   Lilland, Alana, DO  blood glucose meter kit and supplies KIT Dispense based on patient and insurance preference. Use up to four times daily as directed. (FOR ICD-9 250.00, 250.01). 08/25/18   Garnette Gunner, MD  blood glucose meter kit and supplies Dispense based on patient and insurance preference. Use up to four times daily as directed. (FOR ICD-10 E10.9, E11.9). 09/02/18   Garnette Gunner, MD  clindamycin (CLEOCIN-T) 1 % lotion Apply topically daily. 06/03/23 06/02/24  Terri Piedra, DO  doxycycline (VIBRA-TABS) 100 MG tablet Take 1 tablet (100 mg total) by mouth daily. 06/03/23 07/08/23  Terri Piedra, DO  empagliflozin (JARDIANCE) 10 MG TABS tablet Take 1 tablet (10 mg total) by mouth daily. 03/04/23  Thapa, [place], MD  EPINEPHrine 0.3 mg/0.3 mL IJ SOAJ injection Inject 0.3 mg into the muscle as needed for anaphylaxis. 08/13/22   Carlisle Beers, FNP  erythromycin with ethanol (EMGEL) 2 % gel Apply daily to affected areas 10/08/22   Terri Piedra, DO   esomeprazole (NEXIUM) 40 MG capsule Take 1 capsule (40 mg total) by mouth 2 (two) times daily before a meal. 01/01/23   Esterwood, Amy S, PA-C  losartan (COZAAR) 100 MG tablet Take 1 tablet (100 mg total) by mouth at bedtime. 11/09/22   Caro Laroche, DO  Multiple Vitamin (MULTIVITAMIN) capsule Take 1 capsule by mouth daily.    [provider]  nicotine (NICODERM CQ - DOSED IN MG/24 HOURS) 21 mg/24hr patch Place 21 mg onto the skin daily. 04/29/23   [provider]  potassium chloride (KLOR-CON) 10 MEQ tablet Take 10 mEq by mouth daily. 01/04/23   [provider]  RABEprazole (ACIPHEX) 20 MG tablet Take 2 tablets (40 mg total) by mouth in the morning and at bedtime. 05/04/23   Esterwood, Amy S, PA-C  Tazarotene 0.1 % FOAM Apply 1 Application topically every other day. 06/03/23   Terri Piedra, DO  tretinoin (RETIN-A) 0.05 % cream Apply topically at bedtime. USE EVERY OTHER NIGHT FOR 1 MONTH, AFTER ONE MONTH INCREASE USE TO NIGHTLY 10/08/22 10/08/23  Terri Piedra, DO                                                                                                                                    Past Surgical History Past Surgical History:  Procedure Laterality Date   COLONOSCOPY     ELBOW SURGERY Right    IR FLUORO GUIDE CV LINE RIGHT  02/02/2017   IR FLUORO GUIDE CV LINE RIGHT  11/23/2021   IR FLUORO GUIDE CV LINE RIGHT  11/28/2021   IR REMOVAL TUN CV CATH W/O FL  02/26/2022   IR US GUIDE VASC ACCESS RIGHT  02/02/2017   IR US GUIDE VASC ACCESS RIGHT  11/23/2021   UPPER GASTROINTESTINAL ENDOSCOPY     Family History Family History  Problem Relation Age of Onset   Hypertension Mother    Hyperlipidemia Mother    Diabetes type II Father    Colon cancer Maternal Aunt    Esophageal cancer Neg Hx    Rectal cancer Neg Hx    Stomach cancer Neg Hx     Social History Social History   Tobacco Use   Smoking status: Some Days    Current packs/day: 0.50     Average packs/day: 0.5 packs/day for 15.0 years (7.5 ttl pk-yrs)    Types: Cigarettes    Passive exposure: Current   Smokeless tobacco: Never  Vaping Use   Vaping status: Never Used  Substance Use Topics   Alcohol use: Not Currently    Comment: 3-4 beers per day most days   Drug  use: No   Allergies Bee venom and Ibuprofen  Review of Systems Review of Systems  Constitutional:  Positive for fatigue.  Respiratory:  Positive for chest tightness.   Musculoskeletal:  Positive for arthralgias and myalgias.  Neurological:  Positive for headaches.    Physical Exam Vital Signs  I have reviewed the triage vital signs BP (!) 149/72 (BP Location: Right Arm)   Pulse 70   Temp 98.2 F (36.8 C) (Oral)   Resp 18   Ht 5\' 8"  (1.727 m)   Wt 87.8 kg   SpO2 100%   BMI 29.43 kg/m   Physical Exam Constitutional:      General: He is not in acute distress.    Appearance: Normal appearance.  HENT:     Head: Normocephalic and atraumatic.     Nose: No congestion or rhinorrhea.  Eyes:     General:        Right eye: No discharge.        Left eye: No discharge.     Extraocular Movements: Extraocular movements intact.     Pupils: Pupils are equal, round, and reactive to light.  Cardiovascular:     Rate and Rhythm: Normal rate and regular rhythm.     Heart sounds: No murmur heard. Pulmonary:     Effort: No respiratory distress.     Breath sounds: No wheezing or rales.  Abdominal:     General: There is no distension.     Tenderness: There is no abdominal tenderness.  Musculoskeletal:        General: Normal range of motion.     Cervical back: Normal range of motion.  Skin:    General: Skin is warm and dry.  Neurological:     General: No focal deficit present.     Mental Status: He is alert.     Cranial Nerves: No cranial nerve deficit.     Sensory: No sensory deficit.     Motor: No weakness.     ED Results and Treatments Labs (all labs ordered are listed, but only abnormal  results are displayed) Labs Reviewed  LIPASE, BLOOD - Abnormal; Notable for the following components:      Result Value   Lipase 58 (*)    All other components within normal limits  COMPREHENSIVE METABOLIC PANEL - Abnormal; Notable for the following components:   Glucose, Bld 144 (*)    Total Bilirubin 1.3 (*)    All other components within normal limits  CBC - Abnormal; Notable for the following components:   Hemoglobin 17.2 (*)    All other components within normal limits  URINALYSIS, ROUTINE W REFLEX MICROSCOPIC - Abnormal; Notable for the following components:   Color, Urine STRAW (*)    Glucose, UA >=500 (*)    All other components within normal limits  RESP PANEL BY RT-PCR (RSV, FLU A&B, COVID)  RVPGX2  TROPONIN I (HIGH SENSITIVITY)  Radiology CT Head Wo Contrast Result Date: 06/14/2023 CLINICAL DATA:  Headache, sudden, severe EXAM: CT HEAD WITHOUT CONTRAST TECHNIQUE: Contiguous axial images were obtained from the base of the skull through the vertex without intravenous contrast. RADIATION DOSE REDUCTION: This exam was performed according to the departmental dose-optimization program which includes automated exposure control, adjustment of the mA and/or kV according to patient size and/or use of iterative reconstruction technique. COMPARISON:  12/18/2022 FINDINGS: Brain: Normal anatomic configuration. No abnormal intra or extra-axial mass lesion or fluid collection. No abnormal mass effect or midline shift. No evidence of acute intracranial hemorrhage or infarct. Ventricular size is normal. Cerebellum unremarkable. Vascular: Unremarkable Skull: Intact Sinuses/Orbits: Interval development of moderate mucosal thickening within the ethmoid air cells bilaterally and visualized maxillary sinuses. No air-fluid levels. Remaining paranasal sinuses are clear. Orbits are  unremarkable. Other: Mastoid air cells and middle ear cavities are clear. IMPRESSION: 1. No acute intracranial abnormality. No calvarial fracture. 2. Interval development of moderate ethmoid and maxillary sinus disease. Electronically Signed   By: Helyn Numbers M.D.   On: 06/14/2023 04:02    Pertinent labs & imaging results that were available during my care of the patient were reviewed by me and considered in my medical decision making (see MDM for details).  Medications Ordered in ED Medications  alum & mag hydroxide-simeth (MAALOX/MYLANTA) 200-200-20 MG/5ML suspension 30 mL (has no administration in time range)    And  lidocaine (XYLOCAINE) 2 % viscous mouth solution 15 mL (has no administration in time range)  lactated ringers bolus 1,000 mL (has no administration in time range)  acetaminophen (TYLENOL) tablet 1,000 mg (has no administration in time range)                                                                                                                                     Procedures Procedures  (including critical care time)  Medical Decision Making / ED Course   This patient presents to the ED for concern of chest tightness, pain and headache, this involves an extensive number of treatment options, and is a complaint that carries with it a high risk of complications and morbidity.  The differential diagnosis includes ACS, Aortic Dissection, Pneumothorax, Pneumonia, Esophageal Rupture, PE, Tamponade/Pericardial Effusion, pericarditis, esophageal spasm, dysrhythmia, GERD, costochondritis.  MDM: Patient seen emergency room for evaluation of chest tightness headache and dizziness.  Physical exam with reproducible chest wall tenderness over the sternum but is otherwise unremarkable.  No focal motor or sensory deficits.  No cranial nerve deficits.  No wheezing heard on lung exam.  Laboratory evaluation with a hemoglobin of 17.2 but is otherwise unremarkable.  Platelet count is  normal and low suspicion for TTP today.  COVID, flu, RSV negative.  High-sensitivity troponin negative.  Chest x-ray unremarkable.  CT head unremarkable.  Patient given GI cocktail, Lidoderm patch and Tylenol as well as fluid resuscitation on reevaluation symptoms have significant improved.  Given reproducible tenderness over the sternum, very low suspicion for ACS at this time especially in the setting of negative troponins.  At this time he does not meet inpatient criteria for admission and will be discharged outpatient follow-up.    Additional history obtained:  -External records from outside source obtained and reviewed including: Chart review including previous notes, labs, imaging, consultation notes   Lab Tests: -I ordered, reviewed, and interpreted labs.   The pertinent results include:   Labs Reviewed  LIPASE, BLOOD - Abnormal; Notable for the following components:      Result Value   Lipase 58 (*)    All other components within normal limits  COMPREHENSIVE METABOLIC PANEL - Abnormal; Notable for the following components:   Glucose, Bld 144 (*)    Total Bilirubin 1.3 (*)    All other components within normal limits  CBC - Abnormal; Notable for the following components:   Hemoglobin 17.2 (*)    All other components within normal limits  URINALYSIS, ROUTINE W REFLEX MICROSCOPIC - Abnormal; Notable for the following components:   Color, Urine STRAW (*)    Glucose, UA >=500 (*)    All other components within normal limits  RESP PANEL BY RT-PCR (RSV, FLU A&B, COVID)  RVPGX2  TROPONIN I (HIGH SENSITIVITY)      Imaging Studies ordered: I ordered imaging studies including chest x-ray, CT head I independently visualized and interpreted imaging. I agree with the radiologist interpretation   Medicines ordered and prescription drug management: Meds ordered this encounter  Medications   AND Linked Order Group    alum & mag hydroxide-simeth (MAALOX/MYLANTA) 200-200-20 MG/5ML  suspension 30 mL    lidocaine (XYLOCAINE) 2 % viscous mouth solution 15 mL   lactated ringers bolus 1,000 mL   acetaminophen (TYLENOL) tablet 1,000 mg    -I have reviewed the patients home medicines and have made adjustments as needed  Critical interventions none    Cardiac Monitoring: The patient was maintained on a cardiac monitor.  I personally viewed and interpreted the cardiac monitored which showed an underlying rhythm of: NSR  Social Determinants of Health:  Factors impacting patients care include: none   Reevaluation: After the interventions noted above, I reevaluated the patient and found that they have :improved  Co morbidities that complicate the patient evaluation  Past Medical History:  Diagnosis Date   At risk for dysfunction of heart 07/11/2018   Chest wall pain 08/14/2018   Controlled diabetes mellitus type 2 with complications (HCC) 02/25/2018   Diabetes mellitus without complication (HCC)    non-insulin dependent   Diverticulosis 02/01/2017   on CT scan abd/pelvis   GERD (gastroesophageal reflux disease)    Hypertension    Kidney stones 02/03/2017   Leg swelling 05/12/2017   Macrocytic anemia 02/01/2017   Microscopic hematuria 02/01/2017   Mixed hyperlipidemia 02/28/2018   Phlebitis 05/12/2017   Renal insufficiency 02/03/2017   Smoker 04/13/2017      Dispostion: I considered admission for this patient, but at this time he does not meet inpatient criteria for admission and will be discharged with outpatient follow-up.     Final Clinical Impression(s) / ED Diagnoses Final diagnoses:  None     @PCDICTATION @    Glendora Score, MD 06/14/23 2105

## 2023-06-17 ENCOUNTER — Telehealth: Payer: Self-pay | Admitting: Pharmacy Technician

## 2023-06-17 ENCOUNTER — Telehealth: Payer: Self-pay

## 2023-06-17 NOTE — Telephone Encounter (Signed)
Request has expired PA request has been Submitted. New Encounter created for follow up. For additional info see Pharmacy Prior Auth telephone encounter from 02/20.

## 2023-06-17 NOTE — Telephone Encounter (Signed)
*  Gastro  Pharmacy Patient Advocate Encounter   Received notification from CoverMyMeds that prior authorization for RABEprazole Sodium 20MG  dr tablets  is required/requested.   Insurance verification completed.   The patient is insured through [facility] .   Per test claim: PA required; PA submitted to above mentioned insurance via CoverMyMeds Key/confirmation #/EOC [id number] Status is pending

## 2023-06-17 NOTE — Telephone Encounter (Signed)
Pharmacy Patient Advocate Encounter  Received notification from [employer] that Prior Authorization for RABEPRAZOLE 20MG  has been DENIED.  Full denial letter will be uploaded to the media tab. See denial reason below.   PA #/Case ID/Reference #: [id number]  RESUBMITTED WITH ADDT INFO

## 2023-06-17 NOTE — Telephone Encounter (Signed)
Pharmacy Patient Advocate Encounter   Received notification from CoverMyMeds that prior authorization for RABEPRAZOLE 20MG   is required/requested.   Insurance verification completed.   The patient is insured through [facility] .   Per test claim: PA required; PA submitted to above mentioned insurance via CoverMyMeds Key/confirmation #/EOC [facility] Status is pending

## 2023-06-18 ENCOUNTER — Inpatient Hospital Stay: Payer: Medicaid Other | Attending: Oncology

## 2023-06-18 ENCOUNTER — Inpatient Hospital Stay (HOSPITAL_BASED_OUTPATIENT_CLINIC_OR_DEPARTMENT_OTHER): Payer: Medicaid Other | Admitting: Oncology

## 2023-06-18 VITALS — BP 130/94 | HR 83 | Temp 98.2°F | Resp 18 | Ht 68.0 in | Wt 193.6 lb

## 2023-06-18 DIAGNOSIS — F17210 Nicotine dependence, cigarettes, uncomplicated: Secondary | ICD-10-CM | POA: Insufficient documentation

## 2023-06-18 DIAGNOSIS — I10 Essential (primary) hypertension: Secondary | ICD-10-CM | POA: Insufficient documentation

## 2023-06-18 DIAGNOSIS — M3119 Other thrombotic microangiopathy: Secondary | ICD-10-CM | POA: Diagnosis not present

## 2023-06-18 DIAGNOSIS — E1159 Type 2 diabetes mellitus with other circulatory complications: Secondary | ICD-10-CM | POA: Diagnosis not present

## 2023-06-18 DIAGNOSIS — Z5112 Encounter for antineoplastic immunotherapy: Secondary | ICD-10-CM | POA: Diagnosis present

## 2023-06-18 DIAGNOSIS — G4700 Insomnia, unspecified: Secondary | ICD-10-CM | POA: Insufficient documentation

## 2023-06-18 LAB — LACTATE DEHYDROGENASE: LDH: 143 U/L (ref 98–192)

## 2023-06-18 LAB — CMP (CANCER CENTER ONLY)
ALT: 35 U/L (ref 0–44)
AST: 20 U/L (ref 15–41)
Albumin: 4.7 g/dL (ref 3.5–5.0)
Alkaline Phosphatase: 135 U/L — ABNORMAL HIGH (ref 38–126)
Anion gap: 7 (ref 5–15)
BUN: 13 mg/dL (ref 6–20)
CO2: 31 mmol/L (ref 22–32)
Calcium: 9.9 mg/dL (ref 8.9–10.3)
Chloride: 103 mmol/L (ref 98–111)
Creatinine: 0.95 mg/dL (ref 0.61–1.24)
GFR, Estimated: >60 mL/min (ref >60)
Glucose, Bld: 240 mg/dL — ABNORMAL HIGH (ref 70–99)
Potassium: 3.5 mmol/L (ref 3.5–5.1)
Sodium: 141 mmol/L (ref 135–145)
Total Bilirubin: 1.4 mg/dL — ABNORMAL HIGH (ref 0.0–1.2)
Total Protein: 7.1 g/dL (ref 6.5–8.1)

## 2023-06-18 LAB — CBC WITH DIFFERENTIAL (CANCER CENTER ONLY)
Abs Immature Granulocytes: 0.02 10*3/uL (ref 0.00–0.07)
Basophils Absolute: 0 10*3/uL (ref 0.0–0.1)
Basophils Relative: 0 %
Eosinophils Absolute: 0.3 10*3/uL (ref 0.0–0.5)
Eosinophils Relative: 3 %
HCT: 52 % (ref 39.0–52.0)
Hemoglobin: 17.8 g/dL — ABNORMAL HIGH (ref 13.0–17.0)
Immature Granulocytes: 0 %
Lymphocytes Relative: 39 %
Lymphs Abs: 3.9 10*3/uL (ref 0.7–4.0)
MCH: 30.9 pg (ref 26.0–34.0)
MCHC: 34.2 g/dL (ref 30.0–36.0)
MCV: 90.3 fL (ref 80.0–100.0)
Monocytes Absolute: 0.6 10*3/uL (ref 0.1–1.0)
Monocytes Relative: 6 %
Neutro Abs: 5.3 10*3/uL (ref 1.7–7.7)
Neutrophils Relative %: 52 %
Platelet Count: 170 10*3/uL (ref 150–400)
RBC: 5.76 MIL/uL (ref 4.22–5.81)
RDW: 13.4 % (ref 11.5–15.5)
WBC Count: 10.1 10*3/uL (ref 4.0–10.5)
nRBC: 0 % (ref 0.0–0.2)

## 2023-06-18 MED ORDER — ZOLPIDEM TARTRATE 5 MG PO TABS: 5.0000 mg | ORAL_TABLET | Freq: Every evening | ORAL | 0 refills | Status: DC | PRN

## 2023-06-18 NOTE — Progress Notes (Signed)
Reports depression related to chronic illness/health issues. Agreeable to speak w/CSW. Denies any suicidal ideations.

## 2023-06-18 NOTE — Patient Instructions (Signed)
Constipation, Adult-- Start MiraLax 1 capful mixed in 8 ounces fluid daily Senna-S: can take 1 tablet bid Constipation is when a person has trouble pooping (having a bowel movement). When you have this condition, you may poop fewer than 3 times a week. Your poop (stool) may also be dry, hard, or bigger than normal. Follow these instructions at home: Eating and drinking  Eat foods that have a lot of fiber, such as: Fresh fruits and vegetables. Whole grains. Beans. Eat less of foods that are low in fiber and high in fat and sugar, such as: [place] fries. Hamburgers. Cookies. Candy. Soda. Drink enough fluid to keep your pee (urine) pale yellow. General instructions Exercise regularly or as told by your doctor. Try to do 150 minutes of exercise each week. Go to the restroom when you feel like you need to poop. Do not hold it in. Take over-the-counter and prescription medicines only as told by your doctor. These include any fiber supplements. When you poop: Do deep breathing while relaxing your lower belly (abdomen). Relax your pelvic floor. The pelvic floor is a group of muscles that support the rectum, bladder, and intestines (as well as the uterus in women). Watch your condition for any changes. Tell your doctor if you notice any. Keep all follow-up visits as told by your doctor. This is important. Contact a doctor if: You have pain that gets worse. You have a fever. You have not pooped for 4 days. You vomit. You are not hungry. You lose weight. You are bleeding from the opening of the butt (anus). You have thin, pencil-like poop. Get help right away if: You have a fever, and your symptoms suddenly get worse. You leak poop or have blood in your poop. Your belly feels hard or bigger than normal (bloated). You have very bad belly pain. You feel dizzy or you faint. Summary Constipation is when a person poops fewer than 3 times a week, has trouble pooping, or has poop that is dry,  hard, or bigger than normal. Eat foods that have a lot of fiber. Drink enough fluid to keep your pee (urine) pale yellow. Take over-the-counter and prescription medicines only as told by your doctor. These include any fiber supplements. This information is not intended to replace advice given to you by your health care provider. Make sure you discuss any questions you have with your health care provider. Document Revised: 02/25/2022 Document Reviewed: 02/25/2022 Elsevier Patient Education  2024 [employer].

## 2023-06-18 NOTE — Progress Notes (Signed)
[facility] OFFICE PROGRESS NOTE   Diagnosis: TTP  INTERVAL HISTORY:   Eric Hooper turns as scheduled.  He has multiple complaints.  He was seen in the emergency room on 06/14/2023 with a headache and anterior chest discomfort.  He reports the symptoms have been present for several days.  He describes a heavy feeling at the sinuses.  He has posterior sinus drainage.  He complains of constipation.  A chest x-ray 06/14/2023 was unremarkable.  A head CT revealed moderate ethmoid and maxillary sinus disease. No bleeding.  No fever.    He saw dermatology earlier this month.  He is being treated for folliculitis.  Objective:  Vital signs in last 24 hours:  Blood pressure (!) 130/94, pulse 83, temperature 98.2 F (36.8 C), temperature source Temporal, resp. rate 18, height 5\' 8"  (1.727 m), weight 193 lb 9.6 oz (87.8 kg), SpO2 98%.    HEENT: Mild maxillary sinus tenderness Resp: Lungs clear bilaterally Cardio: Regular rate and rhythm GI: No hepatosplenomegaly, soft, no mass Vascular: No leg edema Musculoskeletal: Mild tenderness at the right anterior chest wall  Lab Results:  Lab Results  Component Value Date   WBC 10.1 06/18/2023   HGB 17.8 (H) 06/18/2023   HCT 52.0 06/18/2023   MCV 90.3 06/18/2023   PLT 170 06/18/2023   NEUTROABS 5.3 06/18/2023    CMP  Lab Results  Component Value Date   NA 141 06/13/2023   K 3.7 06/13/2023   CL 107 06/13/2023   CO2 25 06/13/2023   GLUCOSE 144 (H) 06/13/2023   BUN 16 06/13/2023   CREATININE 1.10 06/13/2023   CALCIUM 9.6 06/13/2023   PROT 6.5 06/13/2023   ALBUMIN 4.0 06/13/2023   AST 25 06/13/2023   ALT 33 06/13/2023   ALKPHOS 122 06/13/2023   BILITOT 1.3 (H) 06/13/2023   GFRNONAA >60 06/13/2023   GFRAA 125 06/14/2020    Medications: I have reviewed the patient's current medications.   Assessment/Plan: TTP initially diagnosed in October 2018 with recurrence in April 2020 and July 2023. Initiation of daily  plasma exchange and Solu-Medrol 02/02/2017 [name] Antibody- 72, activity less than 2% on initial presentation; [name] from 08/15/2018 <2.0. Last plasma exchange 02/06/2017 Prednisone 60 mg daily beginning 02/08/2017 Prednisone 40 mg daily beginning 02/17/2017 Prednisone 30 mg daily beginning 03/09/2017 Prednisone 20 mg daily beginning 03/23/2017 Prednisone 10 mg daily beginning 04/01/2017 Prednisone 5 mg daily for 5 days then 5 mg every other day for 5 doses then stop beginning 05/11/2017 Recurrent TTP 08/14/2018 Plasma exchange/steroids started on 08/15/2018.  Plasma exchange discontinued after treatment on 08/19/2019, discharged on prednisone 60mg  daily Prednisone taper to 40 mg daily 08/22/2018 Weekly rituximab for 4 doses beginning 08/25/2018 Plasma exchange resumed 08/26/2018, 08/27/2018, 08/28/2018, 08/29/2018, 08/31/2018, 09/02/2018 Prednisone taper to 30 mg daily 09/01/2018 Cycle 3 weekly Rituxan 09/08/2018 Prednisone taper to 20 mg daily 09/08/2018 Cycle 4 weekly Rituxan 09/15/2018 Prednisone taper to 10 mg daily 09/20/2018 Prednisone taper to 5 mg daily 09/27/2018 Prednisone discontinued 10/07/2018 Admission with relapse of TTP 10/23/2021 Daily Plasma exchange initiated 11/23/2021 prednisone 8/1 Prednisone tapered to 40 mg daily beginning 12/04/2021 Weekly Rituxan for 4 doses beginning 12/04/2021 Discontinue plasmapheresis after appointment 12/05/2021 Admitted to [facility] 12/11/2021-plasmapheresis reinitiated, Cablivi started 12/12/2021, prednisone dose currently 80 mg daily Every other day plasma exchange beginning 12/28/2021-completed 01/09/2022 Second weekly Rituxan 12/31/2021 Prednisone taper to 60 mg daily beginning 12/31/2021 Third weekly Rituxan 01/06/2022 Prednisone taper to 40 mg daily beginning 01/07/2022 Fourth weekly rituximab 01/13/2022 Prednisone taper to  30 mg daily 01/13/2022 Prednisone taper to 20 mg daily 01/20/2022 Prednisone taper to 15 mg daily 02/04/2022 Per patient report 02/19/2022 he has  continued prednisone 30 mg daily and did not make the above adjustments, he will decrease prednisone to 20 mg daily today Prednisone taper to 10 mg daily 03/09/2022 Prednisone taper to 5 mg daily 03/26/2022 Prednisone taper to 5 mg every other day for 5 doses beginning 04/16/2022 ADAMTS 13 :14.2 on 04/02/2023, 7.2 on 04/08/2023 04/22/2023-rituximab [name]: 54.6 on 05/17/2023 2. Xiphoid pain-etiology unclear 3. Hypertension 4. Diabetes 5. Alcohol/Tobacco use 6. History of renal insufficiency 7. Right IJ dialysis catheter placed 02/02/2017, removed New right IJ dialysis catheter 08/15/2018 Catheter removed 09/12/2018 New right IJ dialysis catheter placed 11/23/2021 Temporary hemodialysis catheter converted to tunneled hemodialysis catheter 11/28/2021      Disposition: Eric Hooper appears unchanged.  He will 5 days of doxycycline for the sinus pressure and CT findings.  I recommended he follow-up with his primary provider.  He will begin a trial of Ambien for insomnia.  He will use Senokot and MiraLAX for constipation.  He is stable from a hematologic standpoint.  We will follow-up on the [name] activity from today.  The plan is to continue rituximab every 3 months.  He will return for an office visit in brentuximab 07/08/2023.  Thornton Papas, MD  06/18/2023  10:18 AM

## 2023-06-22 ENCOUNTER — Inpatient Hospital Stay: Payer: Medicaid Other

## 2023-06-22 NOTE — Progress Notes (Signed)
 [facility] Clinical Social Work  Clinical Social Work was referred by nurse for assessment of psychosocial needs(depression related to chronic illness).  Clinical Social Worker contacted patient by phone to offer support and assess for needs. Patient expressed periodic feelings of frustration related to the management of TTP (continued cycles of the conditions and disappointment when blood work numbers are below ideal levels). CSW informed patient of role and provided limited support to patient, as ongoing counseling is not needed at this time. CSW briefly explored sources of support, distinguished between personal feelings of health and blood work, and identified how patient experiences stress. Patient and CSW explored patient's current strategies for management of stress and encouraged patient to increase them right before doctors appointments to help with management. Patient declined any referrals for therapy at this time. Direct number is below for patient to call if needed.  Marguerita Merles, LCSW  Clinical Social Worker [facility] [phone]

## 2023-06-23 ENCOUNTER — Other Ambulatory Visit: Payer: Self-pay | Admitting: *Deleted

## 2023-06-23 ENCOUNTER — Telehealth: Payer: Self-pay | Admitting: *Deleted

## 2023-06-23 DIAGNOSIS — M3119 Other thrombotic microangiopathy: Secondary | ICD-10-CM

## 2023-06-23 NOTE — Telephone Encounter (Signed)
 Notified Eric Hooper that his Adams13 activity returned low at 8.9. He needs to come in tomorrow for Rituxan at 0830. He agrees. NP will see him while he is in treatment area tomorrow. Scheduler aware

## 2023-06-24 ENCOUNTER — Inpatient Hospital Stay (HOSPITAL_BASED_OUTPATIENT_CLINIC_OR_DEPARTMENT_OTHER): Payer: Medicaid Other

## 2023-06-24 ENCOUNTER — Other Ambulatory Visit: Payer: Self-pay

## 2023-06-24 ENCOUNTER — Inpatient Hospital Stay (HOSPITAL_BASED_OUTPATIENT_CLINIC_OR_DEPARTMENT_OTHER): Payer: Medicaid Other | Admitting: Nurse Practitioner

## 2023-06-24 ENCOUNTER — Encounter: Payer: Self-pay | Admitting: Nurse Practitioner

## 2023-06-24 ENCOUNTER — Inpatient Hospital Stay: Payer: Medicaid Other

## 2023-06-24 VITALS — BP 119/78 | HR 70 | Temp 98.2°F | Resp 18

## 2023-06-24 VITALS — BP 138/85 | HR 79 | Temp 98.4°F | Resp 18 | Wt 195.1 lb

## 2023-06-24 DIAGNOSIS — E119 Type 2 diabetes mellitus without complications: Secondary | ICD-10-CM

## 2023-06-24 DIAGNOSIS — M3119 Other thrombotic microangiopathy: Secondary | ICD-10-CM

## 2023-06-24 DIAGNOSIS — Z5112 Encounter for antineoplastic immunotherapy: Secondary | ICD-10-CM | POA: Diagnosis not present

## 2023-06-24 LAB — CMP (CANCER CENTER ONLY)
ALT: 32 U/L (ref 0–44)
AST: 18 U/L (ref 15–41)
Albumin: 4.4 g/dL (ref 3.5–5.0)
Alkaline Phosphatase: 141 U/L — ABNORMAL HIGH (ref 38–126)
Anion gap: 8 (ref 5–15)
BUN: 13 mg/dL (ref 6–20)
CO2: 27 mmol/L (ref 22–32)
Calcium: 9.6 mg/dL (ref 8.9–10.3)
Chloride: 106 mmol/L (ref 98–111)
Creatinine: 0.95 mg/dL (ref 0.61–1.24)
GFR, Estimated: >60 mL/min (ref >60)
Glucose, Bld: 121 mg/dL — ABNORMAL HIGH (ref 70–99)
Potassium: 3.5 mmol/L (ref 3.5–5.1)
Sodium: 141 mmol/L (ref 135–145)
Total Bilirubin: 1.1 mg/dL (ref 0.0–1.2)
Total Protein: 6.6 g/dL (ref 6.5–8.1)

## 2023-06-24 LAB — CBC WITH DIFFERENTIAL (CANCER CENTER ONLY)
Abs Immature Granulocytes: 0.02 10*3/uL (ref 0.00–0.07)
Basophils Absolute: 0 10*3/uL (ref 0.0–0.1)
Basophils Relative: 0 %
Eosinophils Absolute: 0.3 10*3/uL (ref 0.0–0.5)
Eosinophils Relative: 3 %
HCT: 47.7 % (ref 39.0–52.0)
Hemoglobin: 16.6 g/dL (ref 13.0–17.0)
Immature Granulocytes: 0 %
Lymphocytes Relative: 46 %
Lymphs Abs: 4.1 10*3/uL — ABNORMAL HIGH (ref 0.7–4.0)
MCH: 30.9 pg (ref 26.0–34.0)
MCHC: 34.8 g/dL (ref 30.0–36.0)
MCV: 88.7 fL (ref 80.0–100.0)
Monocytes Absolute: 0.7 10*3/uL (ref 0.1–1.0)
Monocytes Relative: 8 %
Neutro Abs: 3.8 10*3/uL (ref 1.7–7.7)
Neutrophils Relative %: 43 %
Platelet Count: 172 10*3/uL (ref 150–400)
RBC: 5.38 MIL/uL (ref 4.22–5.81)
RDW: 13.2 % (ref 11.5–15.5)
WBC Count: 9 10*3/uL (ref 4.0–10.5)
nRBC: 0 % (ref 0.0–0.2)

## 2023-06-24 MED ORDER — SODIUM CHLORIDE 0.9 % IV SOLN
INTRAVENOUS | Status: DC
Start: 2023-06-24 — End: 2023-06-24

## 2023-06-24 MED ORDER — DIPHENHYDRAMINE HCL 25 MG PO CAPS
50.0000 mg | ORAL_CAPSULE | Freq: Once | ORAL | Status: AC
  Administered 2023-06-24: 50 mg via ORAL
  Filled 2023-06-24: qty 2

## 2023-06-24 MED ORDER — ACETAMINOPHEN 325 MG PO TABS
650.0000 mg | ORAL_TABLET | Freq: Once | ORAL | Status: AC
  Administered 2023-06-24: 650 mg via ORAL
  Filled 2023-06-24: qty 2

## 2023-06-24 MED ORDER — SODIUM CHLORIDE 0.9 % IV SOLN
375.0000 mg/m2 | Freq: Once | INTRAVENOUS | Status: DC
  Filled 2023-06-24: qty 80

## 2023-06-24 MED ORDER — SODIUM CHLORIDE 0.9 % IV SOLN
375.0000 mg/m2 | Freq: Once | INTRAVENOUS | Status: AC
  Administered 2023-06-24: 800 mg via INTRAVENOUS
  Filled 2023-06-24: qty 50

## 2023-06-24 NOTE — Patient Instructions (Signed)
 [facility]   Discharge Instructions: Thank you for choosing [facility] to provide your oncology and hematology care.   If you have a lab appointment with the Cancer Center, please go directly to the Cancer Center and check in at the registration area.   Wear comfortable clothing and clothing appropriate for easy access to any Portacath or PICC line.   We strive to give you quality time with your provider. You may need to reschedule your appointment if you arrive late (15 or more minutes).  Arriving late affects you and other patients whose appointments are after yours.  Also, if you miss three or more appointments without notifying the office, you may be dismissed from the clinic at the provider's discretion.      For prescription refill requests, have your pharmacy contact our office and allow 72 hours for refills to be completed.    Today you received the following chemotherapy and/or immunotherapy agents RITUXIMAB      To help prevent nausea and vomiting after your treatment, we encourage you to take your nausea medication as directed.  BELOW ARE SYMPTOMS THAT SHOULD BE REPORTED IMMEDIATELY: *FEVER GREATER THAN 100.4 F (38 C) OR HIGHER *CHILLS OR SWEATING *NAUSEA AND VOMITING THAT IS NOT CONTROLLED WITH YOUR NAUSEA MEDICATION *UNUSUAL SHORTNESS OF BREATH *UNUSUAL BRUISING OR BLEEDING *URINARY PROBLEMS (pain or burning when urinating, or frequent urination) *BOWEL PROBLEMS (unusual diarrhea, constipation, pain near the anus) TENDERNESS IN MOUTH AND THROAT WITH OR WITHOUT PRESENCE OF ULCERS (sore throat, sores in mouth, or a toothache) UNUSUAL RASH, SWELLING OR PAIN  UNUSUAL VAGINAL DISCHARGE OR ITCHING   Items with * indicate a potential emergency and should be followed up as soon as possible or go to the Emergency Department if any problems should occur.  Please show the CHEMOTHERAPY ALERT CARD or IMMUNOTHERAPY  ALERT CARD at check-in to the Emergency Department and triage nurse.  Should you have questions after your visit or need to cancel or reschedule your appointment, please contact [facility]  Dept: [phone]  and follow the prompts.  Office hours are 8:00 a.m. to 4:30 p.m. Monday - Friday. Please note that voicemails left after 4:00 p.m. may not be returned until the following business day.  We are closed weekends and major holidays. You have access to a nurse at all times for urgent questions. Please call the main number to the clinic Dept: [phone] and follow the prompts.   For any non-urgent questions, you may also contact your provider using MyChart. We now offer e-Visits for anyone 35 and older to request care online for non-urgent symptoms. For details visit [email or web address].   Also download the MyChart app! Go to the app store, search "MyChart", open the app, select [place], and log in with your MyChart username and password.  Rituximab Injection What is this medication? RITUXIMAB (ri TUX i mab) treats leukemia and lymphoma. It works by blocking a protein that causes cancer cells to grow and multiply. This helps to slow or stop the spread of cancer cells. It may also be used to treat autoimmune conditions, such as arthritis. It works by slowing down an overactive immune system. It is a monoclonal antibody. This medicine may be used for other purposes; ask your health care provider or pharmacist if you have questions. COMMON BRAND NAME(S): RIABNI, Rituxan, RUXIENCE, truxima What should I  tell my care team before I take this medication? They need to know if you have any of these conditions: Chest pain Heart disease Immune system problems Infection, such as chickenpox, cold sores, hepatitis B, herpes Irregular heartbeat or rhythm Kidney disease Low blood counts, such as low white cells, platelets, red cells Lung  disease Recent or upcoming vaccine An unusual or allergic reaction to rituximab, other medications, foods, dyes, or preservatives Pregnant or trying to get pregnant Breast-feeding How should I use this medication? This medication is injected into a vein. It is given by a care team in a hospital or clinic setting. A special MedGuide will be given to you before each treatment. Be sure to read this information carefully each time. Talk to your care team about the use of this medication in children. While this medication may be prescribed for children as young as 6 months for selected conditions, precautions do apply. Overdosage: If you think you have taken too much of this medicine contact a poison control center or emergency room at once. NOTE: This medicine is only for you. Do not share this medicine with others. What if I miss a dose? Keep appointments for follow-up doses. It is important not to miss your dose. Call your care team if you are unable to keep an appointment. What may interact with this medication? Do not take this medication with any of the following: Live vaccines This medication may also interact with the following: Cisplatin This list may not describe all possible interactions. Give your health care provider a list of all the medicines, herbs, non-prescription drugs, or dietary supplements you use. Also tell them if you smoke, drink alcohol, or use illegal drugs. Some items may interact with your medicine. What should I watch for while using this medication? Your condition will be monitored carefully while you are receiving this medication. You may need blood work while taking this medication. This medication can cause serious infusion reactions. To reduce the risk your care team may give you other medications to take before receiving this one. Be sure to follow the directions from your care team. This medication may increase your risk of getting an infection. Call your care  team for advice if you get a fever, chills, sore throat, or other symptoms of a cold or flu. Do not treat yourself. Try to avoid being around people who are sick. Call your care team if you are around anyone with measles, chickenpox, or if you develop sores or blisters that do not heal properly. Avoid taking medications that contain aspirin, acetaminophen, ibuprofen, naproxen, or ketoprofen unless instructed by your care team. These medications may hide a fever. This medication may cause serious skin reactions. They can happen weeks to months after starting the medication. Contact your care team right away if you notice fevers or flu-like symptoms with a rash. The rash may be red or purple and then turn into blisters or peeling of the skin. You may also notice a red rash with swelling of the face, lips, or lymph nodes in your neck or under your arms. In some patients, this medication may cause a serious brain infection that may cause death. If you have any problems seeing, thinking, speaking, walking, or standing, tell your care team right away. If you cannot reach your care team, urgently seek another source of medical care. Talk to your care team if you may be pregnant. Serious birth defects can occur if you take this medication during pregnancy and for 38  months after the last dose. You will need a negative pregnancy test before starting this medication. Contraception is recommended while taking this medication and for 12 months after the last dose. Your care team can help you find the option that works for you. Do not breastfeed while taking this medication and for at least 6 months after the last dose. What side effects may I notice from receiving this medication? Side effects that you should report to your care team as soon as possible: Allergic reactions or angioedema--skin rash, itching or hives, swelling of the face, eyes, lips, tongue, arms, or legs, trouble swallowing or breathing Bowel  blockage--stomach cramping, unable to have a bowel movement or pass gas, loss of appetite, vomiting Dizziness, loss of balance or coordination, confusion or trouble speaking Heart attack--pain or tightness in the chest, shoulders, arms, or jaw, nausea, shortness of breath, cold or clammy skin, feeling faint or lightheaded Heart rhythm changes--fast or irregular heartbeat, dizziness, feeling faint or lightheaded, chest pain, trouble breathing Infection--fever, chills, cough, sore throat, wounds that don't heal, pain or trouble when passing urine, general feeling of discomfort or being unwell Infusion reactions--chest pain, shortness of breath or trouble breathing, feeling faint or lightheaded Kidney injury--decrease in the amount of urine, swelling of the ankles, hands, or feet Liver injury--right upper belly pain, loss of appetite, nausea, light-colored stool, dark yellow or brown urine, yellowing skin or eyes, unusual weakness or fatigue Redness, blistering, peeling, or loosening of the skin, including inside the mouth Stomach pain that is severe, does not go away, or gets worse Tumor lysis syndrome (TLS)--nausea, vomiting, diarrhea, decrease in the amount of urine, dark urine, unusual weakness or fatigue, confusion, muscle pain or cramps, fast or irregular heartbeat, joint pain Side effects that usually do not require medical attention (report to your care team if they continue or are bothersome): Headache Joint pain Nausea Runny or stuffy nose Unusual weakness or fatigue This list may not describe all possible side effects. Call your doctor for medical advice about side effects. You may report side effects to FDA at 1-800-FDA-1088. Where should I keep my medication? This medication is given in a hospital or clinic. It will not be stored at home. NOTE: This sheet is a summary. It may not cover all possible information. If you have questions about this medicine, talk to your doctor, pharmacist,  or health care provider.  2024 Elsevier/Gold Standard (2021-09-04 00:00:00)

## 2023-06-24 NOTE — Progress Notes (Addendum)
 Patient Care Team: Fleet Contras, MD as PCP - General (Internal Medicine) Ladene Artist, MD as Consulting Physician (Oncology)   CHIEF COMPLAINT: Follow-up TTP  CURRENT THERAPY: Maintenance Rituxan every 3 months  INTERVAL HISTORY [name] returns prior to scheduled follow-up, last seen by Dr. Truett Perna 06/18/2023.  His [name] activity returned low at 8.9, the plan is to begin weekly Rituxan.  He feels well in general with no significant changes in his health recently.  He continues to have left shoulder pain due to rotator cuff repair surgery which has been on hold for quite some time.  He just started Ambien so not sure if it is helping much yet but he slept well last night.  He is not working but remains active, goes to the gym.  Bowel habits are stable for him, constipation since he took steroids in the past, denies bleeding or any bruising.  Denies any new or specific complaints.  ROS  All other systems reviewed and negative  Past Medical History:  Diagnosis Date   At risk for dysfunction of heart 07/11/2018   Chest wall pain 08/14/2018   Controlled diabetes mellitus type 2 with complications (HCC) 02/25/2018   Diabetes mellitus without complication (HCC)    non-insulin dependent   Diverticulosis 02/01/2017   on CT scan abd/pelvis   GERD (gastroesophageal reflux disease)    Hypertension    Kidney stones 02/03/2017   Leg swelling 05/12/2017   Macrocytic anemia 02/01/2017   Microscopic hematuria 02/01/2017   Mixed hyperlipidemia 02/28/2018   Phlebitis 05/12/2017   Renal insufficiency 02/03/2017   Smoker 04/13/2017     Past Surgical History:  Procedure Laterality Date   COLONOSCOPY     ELBOW SURGERY Right    IR FLUORO GUIDE CV LINE RIGHT  02/02/2017   IR FLUORO GUIDE CV LINE RIGHT  11/23/2021   IR FLUORO GUIDE CV LINE RIGHT  11/28/2021   IR REMOVAL TUN CV CATH W/O FL  02/26/2022   IR US GUIDE VASC ACCESS RIGHT  02/02/2017   IR US GUIDE VASC ACCESS RIGHT   11/23/2021   UPPER GASTROINTESTINAL ENDOSCOPY       Outpatient Encounter Medications as of 06/24/2023  Medication Sig Note   amLODipine (NORVASC) 10 MG tablet Take 1 tablet (10 mg total) by mouth at bedtime. (Patient taking differently: Take 10 mg by mouth daily.)    aspirin EC 81 MG tablet Take 1 tablet (81 mg total) by mouth daily. Swallow whole.    atorvastatin (LIPITOR) 40 MG tablet TAKE 1 TABLET BY MOUTH EVERY DAY    blood glucose meter kit and supplies KIT Dispense based on patient and insurance preference. Use up to four times daily as directed. (FOR ICD-9 250.00, 250.01).    blood glucose meter kit and supplies Dispense based on patient and insurance preference. Use up to four times daily as directed. (FOR ICD-10 E10.9, E11.9).    empagliflozin (JARDIANCE) 10 MG TABS tablet Take 1 tablet (10 mg total) by mouth daily.    losartan (COZAAR) 100 MG tablet Take 1 tablet (100 mg total) by mouth at bedtime.    Multiple Vitamin (MULTIVITAMIN) capsule Take 1 capsule by mouth daily.    nicotine (NICODERM CQ - DOSED IN MG/24 HOURS) 21 mg/24hr patch Place 21 mg onto the skin daily.    potassium chloride (KLOR-CON) 10 MEQ tablet Take 10 mEq by mouth daily. 05/17/2023: Takes bid   zolpidem (AMBIEN) 5 MG tablet Take 1 tablet (5 mg total) by  mouth at bedtime as needed for sleep.    acetaminophen (TYLENOL) 325 MG tablet Take 2 tablets (650 mg total) by mouth every 4 (four) hours as needed (discomfort or fever). (Patient not taking: Reported on 06/24/2023)    benzoyl peroxide 5 % gel Apply topically 2 (two) times daily. (Patient not taking: Reported on 06/24/2023) 03/26/2022: For acne   clindamycin (CLEOCIN-T) 1 % lotion Apply topically daily. (Patient not taking: Reported on 06/24/2023)    diclofenac Sodium (VOLTAREN) 1 % GEL Apply 4 g topically 4 (four) times daily. (Patient not taking: Reported on 06/24/2023)    doxycycline (VIBRA-TABS) 100 MG tablet Take 1 tablet (100 mg total) by mouth daily. (Patient not  taking: Reported on 06/24/2023)    EPINEPHrine 0.3 mg/0.3 mL IJ SOAJ injection Inject 0.3 mg into the muscle as needed for anaphylaxis. (Patient not taking: Reported on 06/24/2023) 05/04/2023: On hand   erythromycin with ethanol (EMGEL) 2 % gel Apply daily to affected areas (Patient not taking: Reported on 06/24/2023)    esomeprazole (NEXIUM) 40 MG capsule Take 1 capsule (40 mg total) by mouth 2 (two) times daily before a meal. (Patient not taking: Reported on 06/24/2023) 06/18/2023: Takes prn   Tazarotene 0.1 % FOAM Apply 1 Application topically every other day. (Patient not taking: Reported on 06/24/2023)    tretinoin (RETIN-A) 0.05 % cream Apply topically at bedtime. USE EVERY OTHER NIGHT FOR 1 MONTH, AFTER ONE MONTH INCREASE USE TO NIGHTLY (Patient not taking: Reported on 06/24/2023)    [DISCONTINUED] lidocaine (LIDODERM) 5 % Place 1 patch onto the skin daily. Remove & Discard patch within 12 hours or as directed by MD (Patient not taking: Reported on 06/18/2023)    No facility-administered encounter medications on file as of 06/24/2023.     Today's Vitals   06/24/23 0821 06/24/23 0826  BP: 138/85   Pulse: 79   Resp: 18   Temp: 98.4 F (36.9 C)   TempSrc: Temporal   SpO2: 100%   Weight: 195 lb 1.6 oz (88.5 kg)   PainSc:  10-Worst pain ever   Body mass index is 29.66 kg/m.   PHYSICAL EXAM GENERAL:alert, no distress and comfortable SKIN: no rash  EYES: sclera clear NECK: without mass LYMPH:  no palpable cervical or supraclavicular lymphadenopathy  LUNGS: clear with normal breathing effort HEART: regular rate & rhythm, no lower extremity edema ABDOMEN: abdomen soft, non-tender and normal bowel sounds NEURO: alert & oriented x 3 with fluent speech, no focal motor/sensory deficits   CBC    Component Value Date/Time   WBC 9.0 06/24/2023 0751   WBC 9.6 06/13/2023 2145   RBC 5.38 06/24/2023 0751   HGB 16.6 06/24/2023 0751   HGB 16.2 11/24/2019 1647   HGB 15.6 04/01/2017 1612   HCT  47.7 06/24/2023 0751   HCT 45.7 11/24/2019 1647   HCT 45.0 04/01/2017 1612   PLT 172 06/24/2023 0751   PLT 203 11/24/2019 1647   MCV 88.7 06/24/2023 0751   MCV 92 11/24/2019 1647   MCV 96.4 04/01/2017 1612   MCH 30.9 06/24/2023 0751   MCHC 34.8 06/24/2023 0751   RDW 13.2 06/24/2023 0751   RDW 13.2 11/24/2019 1647   RDW 12.9 04/01/2017 1612   LYMPHSABS 4.1 (H) 06/24/2023 0751   LYMPHSABS 4.7 (H) 11/24/2019 1647   LYMPHSABS 3.7 (H) 04/01/2017 1612   MONOABS 0.7 06/24/2023 0751   MONOABS 1.0 (H) 04/01/2017 1612   EOSABS 0.3 06/24/2023 0751   EOSABS 0.2 11/24/2019 1647   BASOSABS  0.0 06/24/2023 0751   BASOSABS 0.0 11/24/2019 1647   BASOSABS 0.0 04/01/2017 1612     CMP     Component Value Date/Time   NA 141 06/18/2023 0921   NA 139 06/14/2020 1539   NA 141 03/23/2017 1612   K 3.5 06/18/2023 0921   K 4.0 03/23/2017 1612   CL 103 06/18/2023 0921   CO2 31 06/18/2023 0921   CO2 24 03/23/2017 1612   GLUCOSE 240 (H) 06/18/2023 0921   GLUCOSE 193 (H) 03/23/2017 1612   BUN 13 06/18/2023 0921   BUN 9 06/14/2020 1539   BUN 22.5 03/23/2017 1612   CREATININE 0.95 06/18/2023 0921   CREATININE 0.92 12/04/2022 0000   CREATININE 1.1 03/23/2017 1612   CALCIUM 9.9 06/18/2023 0921   CALCIUM 10.1 03/23/2017 1612   PROT 7.1 06/18/2023 0921   PROT 7.0 02/25/2018 1650   PROT 7.0 03/23/2017 1612   ALBUMIN 4.7 06/18/2023 0921   ALBUMIN 4.6 02/25/2018 1650   ALBUMIN 4.1 03/23/2017 1612   AST 20 06/18/2023 0921   AST 14 03/23/2017 1612   ALT 35 06/18/2023 0921   ALT 35 03/23/2017 1612   ALKPHOS 135 (H) 06/18/2023 0921   ALKPHOS 69 03/23/2017 1612   BILITOT 1.4 (H) 06/18/2023 0921   BILITOT 0.41 03/23/2017 1612   GFRNONAA >60 06/18/2023 0921   GFRAA 125 06/14/2020 1539   GFRAA >60 12/22/2018 1439     ASSESSMENT & PLAN:  TTP initially diagnosed in October 2018 with recurrence in April 2020 and July 2023. Initiation of daily plasma exchange and Solu-Medrol 02/02/2017 [name]  Antibody- 72, activity less than 2% on initial presentation; [name] from 08/15/2018 <2.0. Last plasma exchange 02/06/2017 Prednisone 60 mg daily beginning 02/08/2017 Prednisone 40 mg daily beginning 02/17/2017 Prednisone 30 mg daily beginning 03/09/2017 Prednisone 20 mg daily beginning 03/23/2017 Prednisone 10 mg daily beginning 04/01/2017 Prednisone 5 mg daily for 5 days then 5 mg every other day for 5 doses then stop beginning 05/11/2017 Recurrent TTP 08/14/2018 Plasma exchange/steroids started on 08/15/2018.  Plasma exchange discontinued after treatment on 08/19/2019, discharged on prednisone 60mg  daily Prednisone taper to 40 mg daily 08/22/2018 Weekly rituximab for 4 doses beginning 08/25/2018 Plasma exchange resumed 08/26/2018, 08/27/2018, 08/28/2018, 08/29/2018, 08/31/2018, 09/02/2018 Prednisone taper to 30 mg daily 09/01/2018 Cycle 3 weekly Rituxan 09/08/2018 Prednisone taper to 20 mg daily 09/08/2018 Cycle 4 weekly Rituxan 09/15/2018 Prednisone taper to 10 mg daily 09/20/2018 Prednisone taper to 5 mg daily 09/27/2018 Prednisone discontinued 10/07/2018 Admission with relapse of TTP 10/23/2021 Daily Plasma exchange initiated 11/23/2021 prednisone 8/1 Prednisone tapered to 40 mg daily beginning 12/04/2021 Weekly Rituxan for 4 doses beginning 12/04/2021 Discontinue plasmapheresis after appointment 12/05/2021 Ad[facility] 12/11/2021-plasmapheresis reinitiated, Cablivi started 12/12/2021, prednisone dose currently 80 mg daily Every other day plasma exchange beginning 12/28/2021-completed 01/09/2022 Second weekly Rituxan 12/31/2021 Prednisone taper to 60 mg daily beginning 12/31/2021 Third weekly Rituxan 01/06/2022 Prednisone taper to 40 mg daily beginning 01/07/2022 Fourth weekly rituximab 01/13/2022 Prednisone taper to 30 mg daily 01/13/2022 Prednisone taper to 20 mg daily 01/20/2022 Prednisone taper to 15 mg daily 02/04/2022 Per patient report 02/19/2022 he has continued prednisone 30 mg daily and did not make the  above adjustments, he will decrease prednisone to 20 mg daily today Prednisone taper to 10 mg daily 03/09/2022 Prednisone taper to 5 mg daily 03/26/2022 Prednisone taper to 5 mg every other day for 5 doses beginning 04/16/2022 ADAMTS 13 :14.2 on 04/02/2023, 7.2 on 04/08/2023 04/22/2023-rituximab (q3 months) [name]: 54.6 on 05/17/2023 [name]:  8.9 on 06/18/23, Rituxan 06/24/23 - to be given monthly   2. Xiphoid pain-etiology unclear 3. Hypertension 4. Diabetes 5. Alcohol/Tobacco use 6. History of renal insufficiency 7. Right IJ dialysis catheter placed 02/02/2017, removed New right IJ dialysis catheter 08/15/2018 Catheter removed 09/12/2018 New right IJ dialysis catheter placed 11/23/2021 Temporary hemodialysis catheter converted to tunneled hemodialysis catheter 11/28/2021 8. Left shoulder pain, per pt needs rotator cuff repair which has been on hold since TTP recurrence     Disposition:  Mr. Alberta appears stable. He is tolerating Rituximab q3 months without side effects. Plts are normal, he denies bruising/bleeding.   Due to the recent decline in [name] activity, we recommend a trial of monthly Rituxaimab, to see if this prevents a TTP recurrence. Pt agrees with the plan.   Labs reviewed, adequate to proceed with Rituximab today. He will return for lab (CBC, CMP, LDH, [name]) and follow up prior to next cycle in 4 weeks. The treatment plan has been updated.   Seen with Dr. Truett Perna.    Orders Placed This Encounter  Procedures   CBC with Differential (Cancer Center Only)    Standing Status:   Future    Expected Date:   07/22/2023    Expiration Date:   06/23/2024   CMP (Cancer Center only)    Standing Status:   Future    Expected Date:   07/22/2023    Expiration Date:   06/23/2024   Lactate dehydrogenase (LDH)    Standing Status:   Future    Expected Date:   07/22/2023    Expiration Date:   06/23/2024   [name] Activity    Standing Status:   Future    Expected Date:    07/22/2023    Expiration Date:   06/23/2024      All questions were answered. The patient knows to call the clinic with bruising/bleeding or any other concerning changes. No barriers to learning were detected.   Santiago Glad, NP-C 06/24/2023  This was a shared visit with Santiago Glad.  The [name] level returned low last week.  Mr Pellecchia remains in clinical remission from TTP, but the low [name] may predict a clinical relapse.  The [name] increased following rituximab given 2 months ago.  The plan is to begin rituximab on a monthly schedule.  He is in agreement.  I was present for greater than 50% today's visit.  I performed medical decision making.  Mancel Bale, MD

## 2023-06-24 NOTE — Progress Notes (Signed)
 Patient seen by Santiago Glad, NP today  Vitals are within treatment parameters:Yes   Labs are within treatment parameters: No (Please specify and give further instructions.)  Adams 13 activity low- 8.9 Okay to treat Treatment plan has been signed: Yes   Per physician team, Patient is ready for treatment and there are NO modifications to the treatment plan.

## 2023-06-25 LAB — ADAMTS13 ACTIVITY: Adamts 13 Activity: 8.9 % — CL (ref >66.8)

## 2023-06-25 LAB — ADAMTS13 ANTIBODY: ADAMTS13 Antibody: 2 U/mL (ref <12)

## 2023-06-28 ENCOUNTER — Other Ambulatory Visit (HOSPITAL_COMMUNITY)
Admission: RE | Admit: 2023-06-28 | Discharge: 2023-06-28 | Disposition: A | Discharge status: Home / Self Care | Source: Other Acute Inpatient Hospital | Attending: *Deleted | Admitting: *Deleted

## 2023-06-28 ENCOUNTER — Other Ambulatory Visit: Payer: Medicaid Other

## 2023-06-29 ENCOUNTER — Encounter: Payer: Self-pay | Admitting: Oncology

## 2023-06-29 ENCOUNTER — Encounter: Payer: Self-pay | Admitting: Endocrinology

## 2023-06-29 LAB — BASIC METABOLIC PANEL
BUN: 17 mg/dL (ref 7–25)
CO2: 30 mmol/L (ref 20–32)
Calcium: 10.1 mg/dL (ref 8.6–10.3)
Chloride: 104 mmol/L (ref 98–110)
Creat: 0.99 mg/dL (ref 0.60–1.29)
Glucose, Bld: 85 mg/dL (ref 65–99)
Potassium: 4.5 mmol/L (ref 3.5–5.3)
Sodium: 141 mmol/L (ref 135–146)

## 2023-06-29 LAB — MICROALBUMIN / CREATININE URINE RATIO
Creatinine, Urine: 239 mg/dL (ref 20–320)
Microalb Creat Ratio: 18 mg/g{creat} (ref <30)
Microalb, Ur: 4.4 mg/dL

## 2023-06-29 LAB — HEMOGLOBIN A1C
Hgb A1c MFr Bld: 6.9 %{Hb} — ABNORMAL HIGH (ref <5.7)
Mean Plasma Glucose: 151 mg/dL
eAG (mmol/L): 8.4 mmol/L

## 2023-06-30 ENCOUNTER — Other Ambulatory Visit: Payer: Self-pay | Admitting: Oncology

## 2023-06-30 DIAGNOSIS — M3119 Other thrombotic microangiopathy: Secondary | ICD-10-CM

## 2023-07-01 ENCOUNTER — Encounter: Payer: Self-pay | Admitting: Oncology

## 2023-07-01 ENCOUNTER — Ambulatory Visit: Payer: Medicaid Other | Admitting: Nurse Practitioner

## 2023-07-01 ENCOUNTER — Other Ambulatory Visit: Payer: Medicaid Other

## 2023-07-01 ENCOUNTER — Ambulatory Visit: Payer: Medicaid Other

## 2023-07-01 ENCOUNTER — Other Ambulatory Visit: Payer: Self-pay | Admitting: Oncology

## 2023-07-01 DIAGNOSIS — M3119 Other thrombotic microangiopathy: Secondary | ICD-10-CM

## 2023-07-01 NOTE — Telephone Encounter (Signed)
 Per Dr. Truett Perna: Wait and see what K+ looks like on 3/27 before refilling

## 2023-07-01 NOTE — Telephone Encounter (Signed)
 Checking w/MD to determine if this refill is appropriate

## 2023-07-02 ENCOUNTER — Encounter: Payer: Self-pay | Admitting: Endocrinology

## 2023-07-02 ENCOUNTER — Ambulatory Visit (INDEPENDENT_AMBULATORY_CARE_PROVIDER_SITE_OTHER): Payer: Medicaid Other | Admitting: Endocrinology

## 2023-07-02 ENCOUNTER — Other Ambulatory Visit: Payer: Self-pay | Admitting: Physician Assistant

## 2023-07-02 DIAGNOSIS — Z7984 Long term (current) use of oral hypoglycemic drugs: Secondary | ICD-10-CM

## 2023-07-02 DIAGNOSIS — E119 Type 2 diabetes mellitus without complications: Secondary | ICD-10-CM | POA: Diagnosis not present

## 2023-07-02 MED ORDER — EMPAGLIFLOZIN 25 MG PO TABS: 25.0000 mg | ORAL_TABLET | Freq: Every day | ORAL | 3 refills | Status: DC

## 2023-07-02 NOTE — Progress Notes (Signed)
 Outpatient Endocrinology Note [place] Daneisha Surges, MD  07/02/23  Patient's Name: Marquon Alcala    DOB: 1975/12/29    MRN: [id number]                                                    REASON OF VISIT: Follow-up of type 2 diabetes mellitus  PCP: Patient, No Pcp Per  HISTORY OF PRESENT ILLNESS:   Jatinder Gherardi is a 48 y.o. old male with past medical history listed below, is here for follow-up for type 2 diabetes mellitus.   Pertinent Diabetes History: _Diagnosed as Diabetes Mellitus type 2 in 2018.  Patient was initially treated with metformin.  Metformin was switched to SGLT2 inhibitor in February 2024.  Patient reports he was also having sickness with metformin.  Patient had taken glipizide and insulin therapy during steroid treatment for his TTP in the past.  He has a relatively controlled type 2 diabetes mellitus.  Patient was referred to endocrinology for evaluation and management of known type 2 diabetes mellitus, initial consult was in November 2024.  History of DKA or diabetes related hospitalizations: none  Family h/o diabetes mellitus: father type 2 diabetes mellitus.    Chronic Diabetes Complications : Retinopathy: no. Last ophthalmology exam was done on annually, following with ophthalmology regularly.  Nephropathy: no, on ACE/ARB /losartan. Peripheral neuropathy: no Coronary artery disease: no Stroke: no  Relevant comorbidities and cardiovascular risk factors: Obesity: no Body mass index is 29.5 kg/m.  Hypertension: Yes  Hyperlipidemia : Yes, on statin   Current / Home Diabetic regimen includes:  Jardiance 10 mg daily.  Prior diabetic medications: Metformin nausea and vomiting.  Was switched to [place] in February 2024.  He had been on Farxiga in the past.  He had been on insulin therapy during his steroid treatment for his TTP.  Glycemic data:   He forgot to bring glucometer today.  He reports his blood sugar in the morning fasting are mostly acceptable sometime up to  80, 92 and mostly in the range of 115-120.  Rarely blood sugar up to 145.   Hypoglycemia: Patient has no hypoglycemic episodes. Patient has hypoglycemia awareness.  Factors modifying glucose control: 1.  Diabetic diet assessment: 3 meals a day.  2.  Staying active or exercising: Physically active at work.  3.  Medication compliance: compliant all of the time.  Interval history  Hemoglobin A1c worsening 6.9%.  Patient reports there has been no change in diet or physical activity.  He has been taking Jardiance as mentioned above.  Denies any urinary symptoms.  Patient has not been requiring a steroid for more than a year.  He has no other complaints today.  Patient complains of occasional dizziness.  He reports he has been while hydrating and drinking enough water.  Discussed to monitor blood pressure at the time of dizziness.  Advised to keep an eye on it.  If the dizziness becomes a problem we may have to change Jardiance to a different medication.  REVIEW OF SYSTEMS As per history of present illness.   PAST MEDICAL HISTORY: Past Medical History:  Diagnosis Date   At risk for dysfunction of heart 07/11/2018   Chest wall pain 08/14/2018   Controlled diabetes mellitus type 2 with complications (HCC) 02/25/2018   Diabetes mellitus without complication (HCC)    non-insulin dependent  Diverticulosis 02/01/2017   on CT scan abd/pelvis   GERD (gastroesophageal reflux disease)    Hypertension    Kidney stones 02/03/2017   Leg swelling 05/12/2017   Macrocytic anemia 02/01/2017   Microscopic hematuria 02/01/2017   Mixed hyperlipidemia 02/28/2018   Phlebitis 05/12/2017   Renal insufficiency 02/03/2017   Smoker 04/13/2017    PAST SURGICAL HISTORY: Past Surgical History:  Procedure Laterality Date   COLONOSCOPY     ELBOW SURGERY Right    IR FLUORO GUIDE CV LINE RIGHT  02/02/2017   IR FLUORO GUIDE CV LINE RIGHT  11/23/2021   IR FLUORO GUIDE CV LINE RIGHT  11/28/2021   IR REMOVAL  TUN CV CATH W/O FL  02/26/2022   IR US GUIDE VASC ACCESS RIGHT  02/02/2017   IR US GUIDE VASC ACCESS RIGHT  11/23/2021   UPPER GASTROINTESTINAL ENDOSCOPY      ALLERGIES: Allergies  Allergen Reactions   Bee Venom Anaphylaxis   Ibuprofen Other (See Comments)    Drops platelets/ thrombocytopenia    FAMILY HISTORY:  Family History  Problem Relation Age of Onset   Hypertension Mother    Hyperlipidemia Mother    Diabetes type II Father    Colon cancer Maternal Aunt    Esophageal cancer Neg Hx    Rectal cancer Neg Hx    Stomach cancer Neg Hx     SOCIAL HISTORY: Social History   Socioeconomic History   Marital status: Single    Spouse name: Not on file   Number of children: 2   Years of education: Not on file   Highest education level: Not on file  Occupational History   Occupation: Location manager   Occupation: unemployed  Tobacco Use   Smoking status: Former    Current packs/day: 0.50    Average packs/day: 0.5 packs/day for 15.0 years (7.5 ttl pk-yrs)    Types: Cigarettes    Passive exposure: Current   Smokeless tobacco: Never  Vaping Use   Vaping status: Never Used  Substance and Sexual Activity   Alcohol use: Not Currently   Drug use: No   Sexual activity: Yes  Other Topics Concern   Not on file  Social History Narrative   ** Merged History Encounter **       Social Drivers of Corporate investment banker Strain: Not on file  Food Insecurity: Not on file  Transportation Needs: Not on file  Physical Activity: Not on file  Stress: Not on file  Social Connections: Unknown (04/22/2022)   Received from [employer], [facility]   Social Network    Social Network: Not on file    MEDICATIONS:  Current Outpatient Medications  Medication Sig Dispense Refill   aspirin EC 81 MG tablet Take 1 tablet (81 mg total) by mouth daily. Swallow whole.     atorvastatin (LIPITOR) 40 MG tablet TAKE 1 TABLET BY MOUTH EVERY DAY 90 tablet 1   blood glucose meter kit  and supplies KIT Dispense based on patient and insurance preference. Use up to four times daily as directed. (FOR ICD-9 250.00, 250.01). 1 each 0   blood glucose meter kit and supplies Dispense based on patient and insurance preference. Use up to four times daily as directed. (FOR ICD-10 E10.9, E11.9). 1 each 0   losartan (COZAAR) 100 MG tablet Take 1 tablet (100 mg total) by mouth at bedtime. 90 tablet 3   Multiple Vitamin (MULTIVITAMIN) capsule Take 1 capsule by mouth daily.     nicotine (  NICODERM CQ - DOSED IN MG/24 HOURS) 21 mg/24hr patch Place 21 mg onto the skin daily.     potassium chloride (KLOR-CON) 10 MEQ tablet Take 10 mEq by mouth daily.     zolpidem (AMBIEN) 5 MG tablet Take 1 tablet (5 mg total) by mouth at bedtime as needed for sleep. 30 tablet 0   acetaminophen (TYLENOL) 325 MG tablet Take 2 tablets (650 mg total) by mouth every 4 (four) hours as needed (discomfort or fever). (Patient not taking: Reported on 07/02/2023) 20 tablet 0   amLODipine (NORVASC) 10 MG tablet Take 1 tablet (10 mg total) by mouth at bedtime. (Patient not taking: Reported on 07/02/2023) 90 tablet 3   benzoyl peroxide 5 % gel Apply topically 2 (two) times daily. (Patient not taking: Reported on 07/02/2023) 45 g 0   clindamycin (CLEOCIN-T) 1 % lotion Apply topically daily. (Patient not taking: Reported on 07/02/2023) 60 mL 5   diclofenac Sodium (VOLTAREN) 1 % GEL Apply 4 g topically 4 (four) times daily. (Patient not taking: Reported on 07/02/2023) 50 g 0   doxycycline (VIBRA-TABS) 100 MG tablet Take 1 tablet (100 mg total) by mouth daily. (Patient not taking: Reported on 06/18/2023) 5 tablet 6   empagliflozin (JARDIANCE) 25 MG TABS tablet Take 1 tablet (25 mg total) by mouth daily. 90 tablet 3   EPINEPHrine 0.3 mg/0.3 mL IJ SOAJ injection Inject 0.3 mg into the muscle as needed for anaphylaxis. (Patient not taking: Reported on 06/18/2023) 1 each 0   erythromycin with ethanol (EMGEL) 2 % gel Apply daily to affected areas  (Patient not taking: Reported on 07/02/2023) 60 g 2   esomeprazole (NEXIUM) 40 MG capsule Take 1 capsule (40 mg total) by mouth 2 (two) times daily before a meal. (Patient not taking: Reported on 07/02/2023) 60 capsule 6   Tazarotene 0.1 % FOAM Apply 1 Application topically every other day. (Patient not taking: Reported on 07/02/2023) 100 g 6   tretinoin (RETIN-A) 0.05 % cream Apply topically at bedtime. USE EVERY OTHER NIGHT FOR 1 MONTH, AFTER ONE MONTH INCREASE USE TO NIGHTLY (Patient not taking: Reported on 07/02/2023) 45 g 2   No current facility-administered medications for this visit.    PHYSICAL EXAM: Vitals:   07/02/23 0935  BP: 132/82  Pulse: 90  SpO2: 98%  Weight: 194 lb (88 kg)  Height: 5\' 8"  (1.727 m)   Body mass index is 29.5 kg/m.  Wt Readings from Last 3 Encounters:  07/02/23 194 lb (88 kg)  06/24/23 195 lb 1.6 oz (88.5 kg)  06/18/23 193 lb 9.6 oz (87.8 kg)    General: Well developed, well nourished male in no apparent distress.  HEENT: AT/[place], no external lesions.  Eyes: Conjunctiva clear and no icterus. Neck: Neck supple  Lungs: Respirations not labored Neurologic: Alert, oriented, normal speech Extremities / Skin: Dry.   Psychiatric: Does not appear depressed or anxious  Diabetic Foot Exam - Simple   Simple Foot Form Diabetic Foot exam was performed with the following findings: Yes 07/02/2023 10:04 AM  Visual Inspection No deformities, no ulcerations, no other skin breakdown bilaterally: Yes Sensation Testing Intact to touch and monofilament testing bilaterally: Yes Pulse Check Posterior Tibialis and Dorsalis pulse intact bilaterally: Yes Comments     LABS Reviewed Lab Results  Component Value Date   HGBA1C 6.9 (H) 06/28/2023   HGBA1C 6.1 (A) 03/04/2023   HGBA1C 7.2 (A) 11/06/2022   No results found for: "FRUCTOSAMINE" Lab Results  Component Value Date  CHOL 94 (L) 05/29/2022   HDL 42 05/29/2022   LDLCALC 39 05/29/2022   TRIG 53 05/29/2022   CHOLHDL  2.2 05/29/2022   Lab Results  Component Value Date   MICRALBCREAT 18 06/28/2023   MICRALBCREAT 13 05/29/2022   Lab Results  Component Value Date   CREATININE 0.99 06/28/2023   No results found for: "GFR"  ASSESSMENT / PLAN  1. Type 2 diabetes mellitus without complication, without long-term current use of insulin (HCC)    Diabetes Mellitus type 2, complicated by no known complications. - Diabetic status / severity: Controlled.  Lab Results  Component Value Date   HGBA1C 6.9 (H) 06/28/2023    - Hemoglobin A1c goal : <6.5%  Patient has worsening status of her diabetes control.  Hemoglobin A1c was down from 6.1 to 6.9%.  - Medications: See below  I) increase Jardiance from 10 mg daily to 25 mg daily.  - Home glucose testing: Few times a week at different times of the day. - Discussed/ Gave Hypoglycemia treatment plan.  # Consult : Refer to diabetic educator/dietitian for diet plan.  Patient reports he has not seen in the past and is interested to see dietitian.  # Annual urine for microalbuminuria/ creatinine ratio, no microalbuminuria currently, continue ACE/ARB /losartan. Last  Lab Results  Component Value Date   MICRALBCREAT 18 06/28/2023    # Foot check nightly.  # Annual dilated diabetic eye exams.   - Diet: Make healthy diabetic food choices - Life style / activity / exercise: Discussed.  2. Blood pressure  -  BP Readings from Last 1 Encounters:  07/02/23 132/82    - Control is in target.  - No change in current plans.    3. Lipid status / Hyperlipidemia - Last  Lab Results  Component Value Date   LDLCALC 39 05/29/2022   - Continue atorvastatin 40 mg daily.  Managed by primary care provider.  Jaret was seen today for follow-up.  Diagnoses and all orders for this visit:  Type 2 diabetes mellitus without complication, without long-term current use of insulin (HCC) -     empagliflozin (JARDIANCE) 25 MG TABS tablet; Take 1 tablet (25 mg total)  by mouth daily. -     Amb Referral to Nutrition and Diabetic Education -     BASIC METABOLIC PANEL WITH GFR -     Hemoglobin A1c    DISPOSITION Follow up in clinic in 4  months suggested.   All questions answered and patient verbalized understanding of the plan.  [place] Rohin Krejci, MD [facility] [address] [place], [address] [place], [place] [address] Phone # [phone]  At least part of this note was generated using voice recognition software. Inadvertent word errors may have occurred, which were not recognized during the proofreading process.

## 2023-07-08 ENCOUNTER — Other Ambulatory Visit: Payer: Medicaid Other

## 2023-07-08 ENCOUNTER — Ambulatory Visit: Payer: Medicaid Other | Admitting: Nurse Practitioner

## 2023-07-08 ENCOUNTER — Ambulatory Visit: Payer: Medicaid Other | Admitting: Oncology

## 2023-07-08 ENCOUNTER — Ambulatory Visit: Payer: Medicaid Other

## 2023-07-09 ENCOUNTER — Encounter: Payer: Self-pay | Admitting: Family Medicine

## 2023-07-09 ENCOUNTER — Ambulatory Visit: Admitting: Family Medicine

## 2023-07-09 VITALS — BP 136/80 | HR 79 | Temp 97.8°F | Ht 70.0 in | Wt 195.4 lb

## 2023-07-09 DIAGNOSIS — G8929 Other chronic pain: Secondary | ICD-10-CM | POA: Diagnosis not present

## 2023-07-09 DIAGNOSIS — M5010 Cervical disc disorder with radiculopathy, unspecified cervical region: Secondary | ICD-10-CM | POA: Diagnosis not present

## 2023-07-09 DIAGNOSIS — J302 Other seasonal allergic rhinitis: Secondary | ICD-10-CM | POA: Diagnosis not present

## 2023-07-09 DIAGNOSIS — M25512 Pain in left shoulder: Secondary | ICD-10-CM | POA: Diagnosis not present

## 2023-07-09 DIAGNOSIS — J3089 Other allergic rhinitis: Secondary | ICD-10-CM

## 2023-07-09 DIAGNOSIS — M3119 Other thrombotic microangiopathy: Secondary | ICD-10-CM

## 2023-07-09 MED ORDER — AZELASTINE HCL 0.1 % NA SOLN: 2.0000 | Freq: Two times a day (BID) | NASAL | 12 refills | Status: DC

## 2023-07-09 NOTE — Assessment & Plan Note (Signed)
 Avoid NSAIDs given h/o TTP. Declined PT. Refer to Ortho for ESI.

## 2023-07-09 NOTE — Assessment & Plan Note (Signed)
 Continue to follow with Hematology

## 2023-07-09 NOTE — Patient Instructions (Signed)
 It was great to see you!  Our plans for today:  - We are referring you to Orthopedic Surgery and Allergy. Let us know if you don't hear about an appointment in the next few weeks.  - Try the astelin nasal spray for your allergies. Continue the zyrtec.  Take care and seek immediate care sooner if you develop any concerns.   Dr. Linwood Dibbles

## 2023-07-09 NOTE — Progress Notes (Signed)
    SUBJECTIVE:   CHIEF COMPLAINT / HPI:   TTP syndrome - follows with Heme/Onc, last visit 2/25. - on rituxan every 3 months.  Shoulder pain -  - Thoguht 2/2 cervical radiculopathy. Had normal shoulder [place] at [facility] few years ago. - Previously saw ortho for ESI but has been a few years. Had to stop due to lower platelets 2/2 TTP. Can't take NSAIDs due to this. - Prior cervical XR 2023 spurring at C6-7. - Prior shoulder XR 2024 with calcifications at subscap tendon. - Prior Cspine CT 2022 with C5-6 disc protrusion with spinal stenosis. Spinal stenosis and R neural foraminal stenosis at C4-5. - Pain got worse a week ago. No trauma - Takes tylenol, no help. - Not in PT recently. Was in before but didn't help.   Allergies - environmental, has all year long. Grass, trees, dirt, "everything." Lives in older home. - On zyrtec. Flonase made him feel funny, cloudy headed, also didn't feel it helped. - Wants retested for allergies. - typical symptoms: Runny nose, watery eyes and itching. Some trouble breathing.    OBJECTIVE:   BP 136/80   Pulse 79   Temp 97.8 F (36.6 C)   Ht 5\' 10"  (1.778 m)   Wt 195 lb 6.4 oz (88.6 kg)   SpO2 98%   BMI 28.04 kg/m   Gen: well appearing, in NAD Card: RRR Lungs: CTAB Inspection: no asymmetry Palpation: TTP along b/l trap muscles. ROM: decreased abduction, int/ext rotation 2/2 pain Strength: 5/5 UE and grip strength. Stability: no joint laxity Special Tests: negative empty can, speeds, hawkins. Positive spurling. Neurovascular: intact   ASSESSMENT/PLAN:   Cervical disc disorder with radiculopathy of cervical region Avoid NSAIDs given h/o TTP. Declined PT. Refer to Ortho for ESI.  Seasonal allergic rhinitis Continue zyrtec. Trial astelin. Refer to allergy for testing.   T.T.P. syndrome (HCC) Continue to follow with Hematology.   Make appt to f/u other chronic dz with PCP soon.  Caro Laroche, DO

## 2023-07-09 NOTE — Assessment & Plan Note (Signed)
 Continue zyrtec. Trial astelin. Refer to allergy for testing.

## 2023-07-15 ENCOUNTER — Ambulatory Visit: Payer: Self-pay | Admitting: Family Medicine

## 2023-07-15 ENCOUNTER — Encounter: Payer: Self-pay | Admitting: Family Medicine

## 2023-07-15 VITALS — BP 142/88 | HR 70 | Ht 70.0 in | Wt 193.0 lb

## 2023-07-15 DIAGNOSIS — M3119 Other thrombotic microangiopathy: Secondary | ICD-10-CM | POA: Diagnosis not present

## 2023-07-15 DIAGNOSIS — J302 Other seasonal allergic rhinitis: Secondary | ICD-10-CM

## 2023-07-15 DIAGNOSIS — E119 Type 2 diabetes mellitus without complications: Secondary | ICD-10-CM | POA: Diagnosis not present

## 2023-07-15 DIAGNOSIS — I152 Hypertension secondary to endocrine disorders: Secondary | ICD-10-CM

## 2023-07-15 DIAGNOSIS — E1159 Type 2 diabetes mellitus with other circulatory complications: Secondary | ICD-10-CM | POA: Diagnosis not present

## 2023-07-15 DIAGNOSIS — Z0000 Encounter for general adult medical examination without abnormal findings: Secondary | ICD-10-CM

## 2023-07-15 LAB — POCT GLYCOSYLATED HEMOGLOBIN (HGB A1C): HbA1c, POC (controlled diabetic range): 6.5 % (ref 0.0–7.0)

## 2023-07-15 MED ORDER — AZELASTINE HCL 0.1 % NA SOLN: 2.0000 | Freq: Two times a day (BID) | NASAL | 12 refills | Status: DC

## 2023-07-15 MED ORDER — EMPAGLIFLOZIN 25 MG PO TABS: 25.0000 mg | ORAL_TABLET | Freq: Every day | ORAL | 3 refills | Status: DC

## 2023-07-15 NOTE — Assessment & Plan Note (Signed)
-  Follow up with Heme 3/27

## 2023-07-15 NOTE — Progress Notes (Deleted)
   SUBJECTIVE:   CHIEF COMPLAINT / HPI:  Eric Hooper is a 48 y.o. male with a pertinent past medical history of *** presenting to the clinic for    PERTINENT PMH / PSH: ***   OBJECTIVE:   BP (!) 155/102   Pulse 70   Ht 5\' 10"  (1.778 m)   Wt 193 lb (87.5 kg)   SpO2 96%   BMI 27.69 kg/m  Physical Exam General: Age-appropriate, resting comfortably in chair, NAD, alert and at baseline. HEENT:  Head: Normocephalic, atraumatic. No tenderness to percussion over sinuses. Eyes: PERRLA. No conjunctival erythema or scleral injections. Ears: TMs non-bulging and non-erythematous bilaterally. No erythema of external ear canal. No cerumen impaction. Nose: Non-erythematous turbinates. No rhinorrhea. Mouth/Oral: Clear, no tonsillar exudate. MMM. Neck: Supple. No LAD. Cardiovascular: Regular rate and rhythm. Normal S1/S2. No murmurs, rubs, or gallops appreciated. 2+ radial pulses. Pulmonary: Clear bilaterally to ascultation. No wheezes, crackles, or rhonchi. No increased WOB, no accessory muscle usage on room air. Abdominal: No tenderness to deep or light palpation. No rebound or guarding. No HSM. Skin: Warm and dry. Extremities: No peripheral edema bilaterally. Capillary refill <2 seconds.  No results found for this or any previous visit (from the past 48 hours).     11/06/2022   10:48 AM  Depression screen PHQ 2/9  Decreased Interest 0  Down, Depressed, Hopeless 0  PHQ - 2 Score 0  Altered sleeping 3  Tired, decreased energy 2  Change in appetite 0  Feeling bad or failure about yourself  0  Trouble concentrating 0  Moving slowly or fidgety/restless 0  Suicidal thoughts 0  PHQ-9 Score 5     ASSESSMENT/PLAN:   Assessment & Plan Type 2 diabetes mellitus without complication, unspecified whether long term insulin use (HCC)   No follow-ups on file.  Chari Parmenter Sharion Dove, MD [facility]

## 2023-07-15 NOTE — Assessment & Plan Note (Signed)
 Poorly controlled today in clinic, however possible whitecoat hypertension given normal blood pressure readings at home. -Continue losartan 100 mg, amlodipine 10 mg daily -Patient will clarify with mother that losartan caused angioedema, will call back if he wants losartan discontinued -Yearly surveillance BMP -Scheduled with Dr. Raymondo Band 4/8 for 24-hour blood pressure monitoring

## 2023-07-15 NOTE — Patient Instructions (Addendum)
 It was great to see you today! Thank you for choosing [facility] for your primary care.  Today we addressed: Diabetes Your A1c is 6.5 today, which is improving.  Please continue your Jardiance.  You can take 2 of the 10 mg pills until you run out and then switch to the 25 mg dose I am filling today.  Hypertension Please see Dr. Raymondo Band on April 8th for 24 hour blood pressure monitoring.  Let me know if you want me to take you off of the losartan, though it is a very safe and effective medicine normally.  Seasonal allergic rhinitis I refilled your nasal spray.  We are checking some labs today, including a basic metabolic panel.  You will get a MyChart message or a letter if results are normal. Otherwise, you will get a call from [place].  You had a referral placed to ophthalmology for an eye exam, they will call you to set up an appointment. Please give [place] a call if you don't hear back in the next 2-3 weeks.  You should return to our clinic in 6 months or sooner if needed.  Thank you for coming to see [place] at [facility] and for the opportunity to care for you! Kroy Sprung, MD 07/15/2023, 3:36 PM

## 2023-07-15 NOTE — Progress Notes (Signed)
 SUBJECTIVE:   Chief compliant/HPI: annual examination  Eric Hooper is a 48 y.o. who presents today for an annual exam.  Diabetes Last A1c 6.5 today. Home CBGs: fasting 70-100, mid 100s throughout the day  Medications: Jardiance 25 mg daily prescribed recently did not receive higher dose, finishing up Jardiance 10 mg first Eye exam: Due, has had somewhat blurred vision approximately 1 year, concerned about this Foot exam: UTD Microalbumin: UTD Statin: Atorvastatin 40 mg daily No symptoms of hypoglycemia, polyuria, polydipsia, numbness extremities, foot ulcers/trauma  Hypertension BP: (!) 155/102 today on recheck. Home medications include: Losartan 100 mg, amlodipine 10 mg. He endorses taking these medications as prescribed. Expresses concern that his mother told him she experienced angioedema with losartan, he would like to discontinue the medicine, but will first verify the name of the medicine with her. Does check blood pressure at home, these have ranged 130s/80s. Denies hypotension symptoms including dizziness and orthostatic symptoms. Denies headache, vision changes, LUQ pain, hematuria, chest pain. Diet: Reduced salt, increased water. Exercise: Minimal.  TTP Sees hematologist regularly. Gets [name] labs and Rituxan monthly. Returning 3/27 to see heme. Last Plts 172 06/24/2023 and stable.  History tabs reviewed and updated.  Review of systems with no additional concerns.   OBJECTIVE:   BP (!) 155/102   Pulse 70   Ht 5\' 10"  (1.778 m)   Wt 193 lb (87.5 kg)   SpO2 96%   BMI 27.69 kg/m    General: Pleasant, resting comfortably in chair, NAD, alert and at baseline. HEENT:  Head: Normocephalic, atraumatic. Eyes: PERRLA. No conjunctival erythema or scleral injections. Nose: Non-erythematous turbinates. No rhinorrhea. Mouth/Oral: Clear, no tonsillar exudate. MMM. Neck: Supple. No LAD. Cardiovascular: Regular rate and rhythm, does skip ~every 10th beat.  Normal S1/S2. No murmurs, rubs, or gallops appreciated. 2+ radial pulses. Pulmonary: Clear bilaterally to ascultation. No wheezes, crackles, or rhonchi. No increased WOB, no accessory muscle usage on room air. Abdominal: No tenderness to deep or light palpation. No rebound or guarding, nondistended. No HSM. Extremities: No peripheral edema bilaterally. Capillary refill <2 seconds.  Results for orders placed or performed in visit on 07/15/23 (from the past 48 hours)  HgB A1c     Status: None   Collection Time: 07/15/23  2:42 PM  Result Value Ref Range   Hemoglobin A1C     HbA1c POC (<> result, manual entry)     HbA1c, POC (prediabetic range)     HbA1c, POC (controlled diabetic range) 6.5 0.0 - 7.0 %      ASSESSMENT/PLAN:   Assessment & Plan Annual physical exam PHQ score 0, reviewed.  Advanced directive discussion deferred.  Considered the following screening exams based upon USPSTF recommendations: HIV testing: Up-to-date Hepatitis C: Up-to-date Hepatitis B: Up-to-date Syphilis if at high risk: Not indicated Colorectal cancer screening: Up-to-date Immunizations: Tdap declined Hypertension associated with diabetes (HCC) Poorly controlled today in clinic, however possible whitecoat hypertension given normal blood pressure readings at home. -Continue losartan 100 mg, amlodipine 10 mg daily -Patient will clarify with mother that losartan caused angioedema, will call back if he wants losartan discontinued -Yearly surveillance BMP -Scheduled with Dr. Raymondo Band 4/8 for 24-hour blood pressure monitoring Type 2 diabetes mellitus without complication, without long-term current use of insulin (HCC) A1c 6.5.  Possible that worsening vision is related to diabetic retinopathy. -Continue atorvastatin 40 mg daily -Finish Jardiance 10 mg by taking 2 pills daily for 20 mg -Refilled Jardiance 25 mg dose to start once 10 mg finished -  Referral to ophthalmology for yearly diabetic eye screening  exam T.T.P. syndrome (HCC) -Follow up with Heme 3/27 Seasonal allergic rhinitis, unspecified trigger -Refilled azelastine per request  Follow up in 6 months or sooner if desired.   Asiah Befort Sharion Dove, MD [facility]

## 2023-07-15 NOTE — Assessment & Plan Note (Signed)
-  Refilled azelastine per request

## 2023-07-15 NOTE — Assessment & Plan Note (Signed)
 A1c 6.5.  Possible that worsening vision is related to diabetic retinopathy. -Continue atorvastatin 40 mg daily -Finish Jardiance 10 mg by taking 2 pills daily for 20 mg -Refilled Jardiance 25 mg dose to start once 10 mg finished -Referral to ophthalmology for yearly diabetic eye screening exam

## 2023-07-16 ENCOUNTER — Telehealth: Payer: Self-pay | Admitting: Pharmacist

## 2023-07-16 ENCOUNTER — Encounter: Payer: Self-pay | Admitting: Oncology

## 2023-07-16 ENCOUNTER — Encounter: Payer: Self-pay | Admitting: Family Medicine

## 2023-07-16 LAB — BASIC METABOLIC PANEL
BUN/Creatinine Ratio: 18 (ref 9–20)
BUN: 18 mg/dL (ref 6–24)
CO2: 25 mmol/L (ref 20–29)
Calcium: 10.1 mg/dL (ref 8.7–10.2)
Chloride: 104 mmol/L (ref 96–106)
Creatinine, Ser: 1.01 mg/dL (ref 0.76–1.27)
Glucose: 132 mg/dL — ABNORMAL HIGH (ref 70–99)
Potassium: 3.9 mmol/L (ref 3.5–5.2)
Sodium: 146 mmol/L — ABNORMAL HIGH (ref 134–144)
eGFR: 91 mL/min/{1.73_m2} (ref >59)

## 2023-07-16 NOTE — Telephone Encounter (Signed)
 Patient contacted for follow-up of possible use of CGM for elevated glucose readings in the setting of TTP.   Patient willing to schedule for CGM placement and use short-term to assess glycemic control.  A1C < 8   Patient has android phone and has capability to download apps.   Visit scheduled  07/28/2023   Total time with patient call and documentation of interaction: 7 minutes.

## 2023-07-16 NOTE — Telephone Encounter (Signed)
-----   Message from Westley Chandler sent at 07/16/2023  5:32 AM EDT ----- Regarding: A1C andTTP Hey Dimitry,  Nice note---I am wondering if we should get this patient a CGM. With his TTP, I wonder if his A1C is falsely low due to some baseline (low grade) hemolysis? This can falsely lower A1c as you know.  His glucose on BMP are pretty good but there is at 240  Just a thought Thanks CB

## 2023-07-18 ENCOUNTER — Other Ambulatory Visit: Payer: Self-pay | Admitting: Oncology

## 2023-07-19 NOTE — Telephone Encounter (Addendum)
 Pharmacy Patient Advocate Encounter  Received notification from [facility] that Prior Authorization for RABEprazole Sodium 20MG  dr tablets has been DENIED.  Full denial letter will be uploaded to the media tab. See denial reason below.  Denial letter in patient chart (06-17-2023)  PA #/Case ID/Reference #: [id number]

## 2023-07-22 ENCOUNTER — Inpatient Hospital Stay: Payer: Medicaid Other | Attending: Oncology

## 2023-07-22 ENCOUNTER — Encounter: Payer: Self-pay | Admitting: Oncology

## 2023-07-22 ENCOUNTER — Inpatient Hospital Stay (HOSPITAL_BASED_OUTPATIENT_CLINIC_OR_DEPARTMENT_OTHER): Payer: Medicaid Other | Admitting: Oncology

## 2023-07-22 ENCOUNTER — Inpatient Hospital Stay: Payer: Medicaid Other

## 2023-07-22 VITALS — BP 139/86 | HR 74 | Temp 98.4°F | Resp 18 | Ht 70.0 in | Wt 195.4 lb

## 2023-07-22 VITALS — BP 130/84 | HR 55 | Temp 98.4°F | Resp 15

## 2023-07-22 DIAGNOSIS — M3119 Other thrombotic microangiopathy: Secondary | ICD-10-CM | POA: Insufficient documentation

## 2023-07-22 DIAGNOSIS — Z5112 Encounter for antineoplastic immunotherapy: Secondary | ICD-10-CM | POA: Insufficient documentation

## 2023-07-22 LAB — CBC WITH DIFFERENTIAL (CANCER CENTER ONLY)
Abs Immature Granulocytes: 0.01 10*3/uL (ref 0.00–0.07)
Basophils Absolute: 0 10*3/uL (ref 0.0–0.1)
Basophils Relative: 1 %
Eosinophils Absolute: 0.3 10*3/uL (ref 0.0–0.5)
Eosinophils Relative: 4 %
HCT: 48.2 % (ref 39.0–52.0)
Hemoglobin: 16.9 g/dL (ref 13.0–17.0)
Immature Granulocytes: 0 %
Lymphocytes Relative: 44 %
Lymphs Abs: 3.2 10*3/uL (ref 0.7–4.0)
MCH: 31.1 pg (ref 26.0–34.0)
MCHC: 35.1 g/dL (ref 30.0–36.0)
MCV: 88.8 fL (ref 80.0–100.0)
Monocytes Absolute: 0.6 10*3/uL (ref 0.1–1.0)
Monocytes Relative: 8 %
Neutro Abs: 3.2 10*3/uL (ref 1.7–7.7)
Neutrophils Relative %: 43 %
Platelet Count: 166 10*3/uL (ref 150–400)
RBC: 5.43 MIL/uL (ref 4.22–5.81)
RDW: 12.8 % (ref 11.5–15.5)
WBC Count: 7.4 10*3/uL (ref 4.0–10.5)
nRBC: 0 % (ref 0.0–0.2)

## 2023-07-22 LAB — CMP (CANCER CENTER ONLY)
ALT: 28 U/L (ref 0–44)
AST: 17 U/L (ref 15–41)
Albumin: 4.5 g/dL (ref 3.5–5.0)
Alkaline Phosphatase: 143 U/L — ABNORMAL HIGH (ref 38–126)
Anion gap: 8 (ref 5–15)
BUN: 15 mg/dL (ref 6–20)
CO2: 27 mmol/L (ref 22–32)
Calcium: 9.4 mg/dL (ref 8.9–10.3)
Chloride: 106 mmol/L (ref 98–111)
Creatinine: 0.86 mg/dL (ref 0.61–1.24)
GFR, Estimated: >60 mL/min (ref >60)
Glucose, Bld: 126 mg/dL — ABNORMAL HIGH (ref 70–99)
Potassium: 3.5 mmol/L (ref 3.5–5.1)
Sodium: 141 mmol/L (ref 135–145)
Total Bilirubin: 1.6 mg/dL — ABNORMAL HIGH (ref 0.0–1.2)
Total Protein: 6.5 g/dL (ref 6.5–8.1)

## 2023-07-22 LAB — LACTATE DEHYDROGENASE: LDH: 150 U/L (ref 98–192)

## 2023-07-22 MED ORDER — SODIUM CHLORIDE 0.9 % IV SOLN
375.0000 mg/m2 | Freq: Once | INTRAVENOUS | Status: DC
  Filled 2023-07-22: qty 80

## 2023-07-22 MED ORDER — ACETAMINOPHEN 325 MG PO TABS
650.0000 mg | ORAL_TABLET | Freq: Once | ORAL | Status: AC
  Administered 2023-07-22: 650 mg via ORAL
  Filled 2023-07-22: qty 2

## 2023-07-22 MED ORDER — SODIUM CHLORIDE 0.9 % IV SOLN
375.0000 mg/m2 | Freq: Once | INTRAVENOUS | Status: AC
  Administered 2023-07-22: 800 mg via INTRAVENOUS
  Filled 2023-07-22: qty 50

## 2023-07-22 MED ORDER — SODIUM CHLORIDE 0.9 % IV SOLN: INTRAVENOUS | Status: DC

## 2023-07-22 MED ORDER — DIPHENHYDRAMINE HCL 25 MG PO CAPS
50.0000 mg | ORAL_CAPSULE | Freq: Once | ORAL | Status: AC
  Administered 2023-07-22: 50 mg via ORAL
  Filled 2023-07-22: qty 2

## 2023-07-22 NOTE — Progress Notes (Signed)
 [facility] OFFICE PROGRESS NOTE   Diagnosis: TTP  INTERVAL HISTORY:   [name] returns as scheduled.  He completed another treatment with rituximab on 06/24/2023.  He tolerated the rituximab well.  No sign of an allergic reaction.  He generally feels well.  Good appetite.  He reports sinus/nasal congestion for the past few weeks.  He feels a fullness in the upper chest.  No fever.  He had bleeding when blowing his nose.  No other bleeding.  Objective:  Vital signs in last 24 hours:  Blood pressure 139/86, pulse 74, temperature 98.4 F (36.9 C), temperature source Temporal, resp. rate 18, height 5\' 10"  (1.778 m), weight 195 lb 6.4 oz (88.6 kg), SpO2 100%.    HEENT: Nostrils without blood, no thrush Resp: Lungs clear bilaterally Cardio: Regular rate and rhythm GI: No hepatosplenomegaly, no mass, nontender Vascular: No leg edema   Lab Results:  Lab Results  Component Value Date   WBC 7.4 07/22/2023   HGB 16.9 07/22/2023   HCT 48.2 07/22/2023   MCV 88.8 07/22/2023   PLT 166 07/22/2023   NEUTROABS 3.2 07/22/2023    CMP  Lab Results  Component Value Date   NA 146 (H) 07/15/2023   K 3.9 07/15/2023   CL 104 07/15/2023   CO2 25 07/15/2023   GLUCOSE 132 (H) 07/15/2023   BUN 18 07/15/2023   CREATININE 1.01 07/15/2023   CALCIUM 10.1 07/15/2023   PROT 6.6 06/24/2023   ALBUMIN 4.4 06/24/2023   AST 18 06/24/2023   ALT 32 06/24/2023   ALKPHOS 141 (H) 06/24/2023   BILITOT 1.1 06/24/2023   GFRNONAA >60 06/24/2023   GFRAA 125 06/14/2020     Medications: I have reviewed the patient's current medications.   Assessment/Plan: TTP initially diagnosed in October 2018 with recurrence in April 2020 and July 2023. Initiation of daily plasma exchange and Solu-Medrol 02/02/2017 [name] Antibody- 72, activity less than 2% on initial presentation; [name] from 08/15/2018 <2.0. Last plasma exchange 02/06/2017 Prednisone 60 mg daily beginning  02/08/2017 Prednisone 40 mg daily beginning 02/17/2017 Prednisone 30 mg daily beginning 03/09/2017 Prednisone 20 mg daily beginning 03/23/2017 Prednisone 10 mg daily beginning 04/01/2017 Prednisone 5 mg daily for 5 days then 5 mg every other day for 5 doses then stop beginning 05/11/2017 Recurrent TTP 08/14/2018 Plasma exchange/steroids started on 08/15/2018.  Plasma exchange discontinued after treatment on 08/19/2019, discharged on prednisone 60mg  daily Prednisone taper to 40 mg daily 08/22/2018 Weekly rituximab for 4 doses beginning 08/25/2018 Plasma exchange resumed 08/26/2018, 08/27/2018, 08/28/2018, 08/29/2018, 08/31/2018, 09/02/2018 Prednisone taper to 30 mg daily 09/01/2018 Cycle 3 weekly Rituxan 09/08/2018 Prednisone taper to 20 mg daily 09/08/2018 Cycle 4 weekly Rituxan 09/15/2018 Prednisone taper to 10 mg daily 09/20/2018 Prednisone taper to 5 mg daily 09/27/2018 Prednisone discontinued 10/07/2018 Admission with relapse of TTP 10/23/2021 Daily Plasma exchange initiated 11/23/2021 prednisone 8/1 Prednisone tapered to 40 mg daily beginning 12/04/2021 Weekly Rituxan for 4 doses beginning 12/04/2021 Discontinue plasmapheresis after appointment 12/05/2021 Admitted to [facility] 12/11/2021-plasmapheresis reinitiated, Cablivi started 12/12/2021, prednisone dose currently 80 mg daily Every other day plasma exchange beginning 12/28/2021-completed 01/09/2022 Second weekly Rituxan 12/31/2021 Prednisone taper to 60 mg daily beginning 12/31/2021 Third weekly Rituxan 01/06/2022 Prednisone taper to 40 mg daily beginning 01/07/2022 Fourth weekly rituximab 01/13/2022 Prednisone taper to 30 mg daily 01/13/2022 Prednisone taper to 20 mg daily 01/20/2022 Prednisone taper to 15 mg daily 02/04/2022 Per patient report 02/19/2022 he has continued prednisone 30 mg daily and did not make the above  adjustments, he will decrease prednisone to 20 mg daily today Prednisone taper to 10 mg daily 03/09/2022 Prednisone taper to 5 mg daily  03/26/2022 Prednisone taper to 5 mg every other day for 5 doses beginning 04/16/2022 ADAMTS 13 :14.2 on 04/02/2023, 7.2 on 04/08/2023 04/22/2023-rituximab [name]: 54.6 on 05/17/2023 [name] 06/18/2023-8.9%, antibody-2 06/24/2023-rituximab 07/22/2023-rituximab 2. Xiphoid pain-etiology unclear 3. Hypertension 4. Diabetes 5. Alcohol/Tobacco use 6. History of renal insufficiency 7. Right IJ dialysis catheter placed 02/02/2017, removed New right IJ dialysis catheter 08/15/2018 Catheter removed 09/12/2018 New right IJ dialysis catheter placed 11/23/2021 Temporary hemodialysis catheter converted to tunneled hemodialysis catheter 11/28/2021       Disposition: [name] remains in hematologic remission from TTP.  He completed treatment with rituximab on on 06/24/2023.  He tolerated the rituximab well.  We will follow-up on the ADAMTS activity from today.  He will complete another treatment with rituximab today.  He will return for an office and lab visit in 4 weeks.  Thornton Papas, MD  07/22/2023  9:36 AM

## 2023-07-22 NOTE — Progress Notes (Signed)
 Patient seen by Dr. Thornton Papas today  Vitals are within treatment parameters:Yes   Labs are within treatment parameters: Yes   Treatment plan has been signed: Yes   Per physician team, Patient is ready for treatment and there are NO modifications to the treatment plan.

## 2023-07-22 NOTE — Patient Instructions (Signed)
 [facility]   Discharge Instructions: Thank you for choosing [facility] to provide your oncology and hematology care.   If you have a lab appointment with the Cancer Center, please go directly to the Cancer Center and check in at the registration area.   Wear comfortable clothing and clothing appropriate for easy access to any Portacath or PICC line.   We strive to give you quality time with your provider. You may need to reschedule your appointment if you arrive late (15 or more minutes).  Arriving late affects you and other patients whose appointments are after yours.  Also, if you miss three or more appointments without notifying the office, you may be dismissed from the clinic at the provider's discretion.      For prescription refill requests, have your pharmacy contact our office and allow 72 hours for refills to be completed.    Today you received the following chemotherapy and/or immunotherapy agents RITUXAN       To help prevent nausea and vomiting after your treatment, we encourage you to take your nausea medication as directed.  BELOW ARE SYMPTOMS THAT SHOULD BE REPORTED IMMEDIATELY: *FEVER GREATER THAN 100.4 F (38 C) OR HIGHER *CHILLS OR SWEATING *NAUSEA AND VOMITING THAT IS NOT CONTROLLED WITH YOUR NAUSEA MEDICATION *UNUSUAL SHORTNESS OF BREATH *UNUSUAL BRUISING OR BLEEDING *URINARY PROBLEMS (pain or burning when urinating, or frequent urination) *BOWEL PROBLEMS (unusual diarrhea, constipation, pain near the anus) TENDERNESS IN MOUTH AND THROAT WITH OR WITHOUT PRESENCE OF ULCERS (sore throat, sores in mouth, or a toothache) UNUSUAL RASH, SWELLING OR PAIN  UNUSUAL VAGINAL DISCHARGE OR ITCHING   Items with * indicate a potential emergency and should be followed up as soon as possible or go to the Emergency Department if any problems should occur.  Please show the CHEMOTHERAPY ALERT CARD or IMMUNOTHERAPY  ALERT CARD at check-in to the Emergency Department and triage nurse.  Should you have questions after your visit or need to cancel or reschedule your appointment, please contact [facility]  Dept: [phone]  and follow the prompts.  Office hours are 8:00 a.m. to 4:30 p.m. Monday - Friday. Please note that voicemails left after 4:00 p.m. may not be returned until the following business day.  We are closed weekends and major holidays. You have access to a nurse at all times for urgent questions. Please call the main number to the clinic Dept: [phone] and follow the prompts.   For any non-urgent questions, you may also contact your provider using MyChart. We now offer e-Visits for anyone 83 and older to request care online for non-urgent symptoms. For details visit [email or web address].   Also download the MyChart app! Go to the app store, search "MyChart", open the app, select [facility], and log in with your MyChart username and password.  Rituximab Injection What is this medication? RITUXIMAB (ri TUX i mab) treats leukemia and lymphoma. It works by blocking a protein that causes cancer cells to grow and multiply. This helps to slow or stop the spread of cancer cells. It may also be used to treat autoimmune conditions, such as arthritis. It works by slowing down an overactive immune system. It is a monoclonal antibody. This medicine may be used for other purposes; ask your health care provider or pharmacist if you have questions. COMMON BRAND NAME(S): RIABNI, Rituxan, RUXIENCE, truxima What should  I tell my care team before I take this medication? They need to know if you have any of these conditions: Chest pain Heart disease Immune system problems Infection, such as chickenpox, cold sores, hepatitis B, herpes Irregular heartbeat or rhythm Kidney disease Low blood counts, such as low white cells, platelets, red cells Lung  disease Recent or upcoming vaccine An unusual or allergic reaction to rituximab, other medications, foods, dyes, or preservatives Pregnant or trying to get pregnant Breast-feeding How should I use this medication? This medication is injected into a vein. It is given by a care team in a hospital or clinic setting. A special MedGuide will be given to you before each treatment. Be sure to read this information carefully each time. Talk to your care team about the use of this medication in children. While this medication may be prescribed for children as young as 6 months for selected conditions, precautions do apply. Overdosage: If you think you have taken too much of this medicine contact a poison control center or emergency room at once. NOTE: This medicine is only for you. Do not share this medicine with others. What if I miss a dose? Keep appointments for follow-up doses. It is important not to miss your dose. Call your care team if you are unable to keep an appointment. What may interact with this medication? Do not take this medication with any of the following: Live vaccines This medication may also interact with the following: Cisplatin This list may not describe all possible interactions. Give your health care provider a list of all the medicines, herbs, non-prescription drugs, or dietary supplements you use. Also tell them if you smoke, drink alcohol, or use illegal drugs. Some items may interact with your medicine. What should I watch for while using this medication? Your condition will be monitored carefully while you are receiving this medication. You may need blood work while taking this medication. This medication can cause serious infusion reactions. To reduce the risk your care team may give you other medications to take before receiving this one. Be sure to follow the directions from your care team. This medication may increase your risk of getting an infection. Call your care  team for advice if you get a fever, chills, sore throat, or other symptoms of a cold or flu. Do not treat yourself. Try to avoid being around people who are sick. Call your care team if you are around anyone with measles, chickenpox, or if you develop sores or blisters that do not heal properly. Avoid taking medications that contain aspirin, acetaminophen, ibuprofen, naproxen, or ketoprofen unless instructed by your care team. These medications may hide a fever. This medication may cause serious skin reactions. They can happen weeks to months after starting the medication. Contact your care team right away if you notice fevers or flu-like symptoms with a rash. The rash may be red or purple and then turn into blisters or peeling of the skin. You may also notice a red rash with swelling of the face, lips, or lymph nodes in your neck or under your arms. In some patients, this medication may cause a serious brain infection that may cause death. If you have any problems seeing, thinking, speaking, walking, or standing, tell your care team right away. If you cannot reach your care team, urgently seek another source of medical care. Talk to your care team if you may be pregnant. Serious birth defects can occur if you take this medication during pregnancy and for  12 months after the last dose. You will need a negative pregnancy test before starting this medication. Contraception is recommended while taking this medication and for 12 months after the last dose. Your care team can help you find the option that works for you. Do not breastfeed while taking this medication and for at least 6 months after the last dose. What side effects may I notice from receiving this medication? Side effects that you should report to your care team as soon as possible: Allergic reactions or angioedema--skin rash, itching or hives, swelling of the face, eyes, lips, tongue, arms, or legs, trouble swallowing or breathing Bowel  blockage--stomach cramping, unable to have a bowel movement or pass gas, loss of appetite, vomiting Dizziness, loss of balance or coordination, confusion or trouble speaking Heart attack--pain or tightness in the chest, shoulders, arms, or jaw, nausea, shortness of breath, cold or clammy skin, feeling faint or lightheaded Heart rhythm changes--fast or irregular heartbeat, dizziness, feeling faint or lightheaded, chest pain, trouble breathing Infection--fever, chills, cough, sore throat, wounds that don't heal, pain or trouble when passing urine, general feeling of discomfort or being unwell Infusion reactions--chest pain, shortness of breath or trouble breathing, feeling faint or lightheaded Kidney injury--decrease in the amount of urine, swelling of the ankles, hands, or feet Liver injury--right upper belly pain, loss of appetite, nausea, light-colored stool, dark yellow or brown urine, yellowing skin or eyes, unusual weakness or fatigue Redness, blistering, peeling, or loosening of the skin, including inside the mouth Stomach pain that is severe, does not go away, or gets worse Tumor lysis syndrome (TLS)--nausea, vomiting, diarrhea, decrease in the amount of urine, dark urine, unusual weakness or fatigue, confusion, muscle pain or cramps, fast or irregular heartbeat, joint pain Side effects that usually do not require medical attention (report to your care team if they continue or are bothersome): Headache Joint pain Nausea Runny or stuffy nose Unusual weakness or fatigue This list may not describe all possible side effects. Call your doctor for medical advice about side effects. You may report side effects to FDA at 1-800-FDA-1088. Where should I keep my medication? This medication is given in a hospital or clinic. It will not be stored at home. NOTE: This sheet is a summary. It may not cover all possible information. If you have questions about this medicine, talk to your doctor, pharmacist,  or health care provider.  2024 Elsevier/Gold Standard (2021-09-04 00:00:00)

## 2023-07-24 ENCOUNTER — Other Ambulatory Visit: Payer: Self-pay | Admitting: Physician Assistant

## 2023-07-25 LAB — ADAMTS13 ACTIVITY REFLEX

## 2023-07-25 LAB — ADAMTS13 ACTIVITY: Adamts 13 Activity: 64.6 % — ABNORMAL LOW (ref >66.8)

## 2023-07-26 ENCOUNTER — Telehealth: Payer: Self-pay

## 2023-07-26 NOTE — Telephone Encounter (Signed)
 Spoke with patient via phone call to share the following information per Dr. Truett Perna, "[name] [place] is higher, looks like the rituximab is working, follow-up as scheduled." Patient agreed.

## 2023-07-28 ENCOUNTER — Ambulatory Visit: Admitting: Pharmacist

## 2023-07-29 ENCOUNTER — Encounter: Payer: Self-pay | Admitting: Pharmacist

## 2023-07-29 ENCOUNTER — Ambulatory Visit (INDEPENDENT_AMBULATORY_CARE_PROVIDER_SITE_OTHER): Admitting: Pharmacist

## 2023-07-29 VITALS — BP 152/92 | Wt 196.0 lb

## 2023-07-29 DIAGNOSIS — I152 Hypertension secondary to endocrine disorders: Secondary | ICD-10-CM | POA: Diagnosis not present

## 2023-07-29 DIAGNOSIS — F17200 Nicotine dependence, unspecified, uncomplicated: Secondary | ICD-10-CM

## 2023-07-29 DIAGNOSIS — E1159 Type 2 diabetes mellitus with other circulatory complications: Secondary | ICD-10-CM

## 2023-07-29 NOTE — Progress Notes (Signed)
 S:     Chief Complaint  Patient presents with   Medication Management    Amb BP Monitor Day #1   48 y.o. male who presents for hypertension evaluation, education, and management. Patient arrives in good spirits and presents without any assistance.   PMH is significant for T2DM, HTN, TTP.  Patient was referred and last seen by Primary Care Provider, Dr. Sharion Dove, on 07/15/23.  At last visit, empagliflozin (Jardiance) was increased from 10 to 25 mg daily. However, patient reports taking 10 mg once daily due to concerns of low glucose readings (~70 mg/dL) and feeling symptomatic (lethargic) with increased dose.   Diagnosed with Hypertension in the year of ~ 20 years.    Medication compliance is reported to be good.  Discussed procedure for wearing the monitor and gave patient written instructions. Monitor was placed on non-dominant arm with instructions to return in the morning.   Current BP Medications include:  amlodipine 10 mg, losartan 100 mg (also takes potassium for low K)   O:  Review of Systems  All other systems reviewed and are negative.   Physical Exam Vitals reviewed.  Constitutional:      Appearance: Normal appearance.  Pulmonary:     Effort: Pulmonary effort is normal.  Neurological:     Mental Status: He is alert.  Psychiatric:        Mood and Affect: Mood normal.        Behavior: Behavior normal.        Thought Content: Thought content normal.        Judgment: Judgment normal.     Last 3 Office BP readings: BP Readings from Last 3 Encounters:  07/22/23 130/84  07/22/23 139/86  07/15/23 (!) 142/88    Clinical Atherosclerotic Cardiovascular Disease (ASCVD): No  The ASCVD Risk score (Arnett DK, et al., 2019) failed to calculate for the following reasons:   The valid total cholesterol range is 130 to 320 mg/dL  Basic Metabolic Panel    Component Value Date/Time   NA 141 07/22/2023 0902   NA 146 (H) 07/15/2023 1551   NA 141 03/23/2017 1612   K  3.5 07/22/2023 0902   K 4.0 03/23/2017 1612   CL 106 07/22/2023 0902   CO2 27 07/22/2023 0902   CO2 24 03/23/2017 1612   GLUCOSE 126 (H) 07/22/2023 0902   GLUCOSE 193 (H) 03/23/2017 1612   BUN 15 07/22/2023 0902   BUN 18 07/15/2023 1551   BUN 22.5 03/23/2017 1612   CREATININE 0.86 07/22/2023 0902   CREATININE 0.99 06/28/2023 1602   CREATININE 1.1 03/23/2017 1612   CALCIUM 9.4 07/22/2023 0902   CALCIUM 10.1 03/23/2017 1612   GFRNONAA >60 07/22/2023 0902   GFRAA 125 06/14/2020 1539   GFRAA >60 12/22/2018 1439    Renal function: Estimated Creatinine Clearance: 116.4 mL/min (by C-G formula based on SCr of 0.86 mg/dL).   ABPM Study Data: Arm Placement left arm   For Office Goal BP of <130/80 mmHg:  ABPM thresholds: Overall BP <125/75 mmHg, daytime BP <130/80 mmHg, sleeptime BP <110/65 mmHg   A/P: History of hypertension longstanding currently taking; losartan and amlodipine with goal presssure of <130/80 mm Hg.  Appears to require potassium supplement despite ARB. -Placed blood pressure cuff, provided education, patient instructed to wear cuff for 24 hours and return tomorrow to review results.   History of tobacco use disorder - reports quitting for > 1 month.  Appears to be an excellent candidate for long-term abstinence.  Not using any tobacco cessation medications (removed NRT - patch from med list today). Congratulated on success.   Plan to adjust problem list to Hx of Tobacco Use once QUIT for 3 months.    Written patient instructions provided including activity/symptom/event log. Patient verbalized understanding of plan. Total time in face to face counseling 18 minutes.    Follow-up: Tomorrow AM - early morning appointment 8:30  Patient seen with Threasa Heads, PharmD Candidate and Darolyn Rua, PharmD Candidate.

## 2023-07-29 NOTE — Patient Instructions (Signed)
 Blood Pressure Activity Diary Time Lying down/ Sleeping Walking/ Exercise Stressed/ Angry Headache/ Pain Dizzy  9 AM       10 AM       11 AM       12 PM       1 PM       2 PM       Time Lying down/ Sleeping Walking/ Exercise Stressed/ Angry Headache/ Pain Dizzy  3 PM       4 PM        5 PM       6 PM       7 PM       8 PM       Time Lying down/ Sleeping Walking/ Exercise Stressed/ Angry Headache/ Pain Dizzy  9 PM       10 PM       11 PM       12 AM       1 AM       2 AM       3 AM       Time Lying down/ Sleeping Walking/ Exercise Stressed/ Angry Headache/ Pain Dizzy  4 AM       5 AM       6 AM       7 AM       8 AM       9 AM       10 AM        Time you woke up: _________                  Time you went to sleep:__________  Come back tomorrow at 8:30 a.m. to have the monitor removed  Call the [facility] if you have any questions before then ([phone])  Wearing the Blood Pressure Monitor The cuff will inflate every 20 minutes during the day and every 30 minutes while you sleep. Fill out the blood pressure-activity diary during the day, especially during activities that may affect your reading -- such as exercise, stress, walking, taking your blood pressure medications  Important things to know: Avoid taking the monitor off for the next 24 hours, unless it causes you discomfort or pain. Do NOT get the monitor wet and do NOT try to clean the monitor with any cleaning products. Do NOT put the monitor on anyone else's arm. When the cuff inflates, avoid excess movement. Let the cuffed arm hang loosely, slightly away from the body. Avoid flexing the muscles or moving the hand/fingers. Remember to fill out the blood pressure activity diary. If you experience severe pain or unusual pain (not associated with getting your blood pressure checked), remove the monitor.  Troubleshooting:  Code  Troubleshooting   1  Check cuff position, tighten cuff   2, 3  Remain  still during reading   4, 87  Check air hose connections and make sure cuff is tight   85, 89  Check hose connections and make tubing is not crimped   86  Push START/STOP to restart reading   88, 91  Retry by pushing START/STOP   90  Replace batteries. If problem persists, remove monitor and bring back to   clinic at follow up   97, 98, 99  Service required - Remove monitor and bring back to clinic at follow up

## 2023-07-29 NOTE — Assessment & Plan Note (Signed)
 History of tobacco use disorder - reports quitting for > 1 month.  Appears to be an excellent candidate for long-term abstinence.  Not using any tobacco cessation medications (removed NRT - patch from med list today). Congratulated on success.   Plan to adjust problem list to Hx of Tobacco Use once QUIT for 3 months.

## 2023-07-29 NOTE — Assessment & Plan Note (Signed)
 History of hypertension longstanding currently taking; losartan and amlodipine with goal presssure of <130/80 mm Hg.  Appears to require potassium supplement despite ARB. -Placed blood pressure cuff, provided education, patient instructed to wear cuff for 24 hours and return tomorrow to review results.

## 2023-07-30 ENCOUNTER — Ambulatory Visit: Admitting: Pharmacist

## 2023-07-30 ENCOUNTER — Encounter: Payer: Self-pay | Admitting: Pharmacist

## 2023-07-30 VITALS — BP 134/83 | HR 56

## 2023-07-30 DIAGNOSIS — E1159 Type 2 diabetes mellitus with other circulatory complications: Secondary | ICD-10-CM | POA: Diagnosis not present

## 2023-07-30 DIAGNOSIS — I152 Hypertension secondary to endocrine disorders: Secondary | ICD-10-CM

## 2023-07-30 MED ORDER — EPLERENONE 25 MG PO TABS: 25.0000 mg | ORAL_TABLET | Freq: Every day | ORAL | 1 refills | Status: DC

## 2023-07-30 NOTE — Patient Instructions (Signed)
 It was nice to see you today!  Thank you for completing the blood pressure monitoring evaluation.  Your goal blood pressure is < 130/80 mmHg   Medication Changes: START Eplerenone 25 mg once daily  Discontinue Potassium pills  Continue all other medication the same.   Monitor blood pressure at home and keep a log (on a piece of paper) to bring with you to your next visit.

## 2023-07-30 NOTE — Progress Notes (Signed)
 Reviewed and agree with Dr Macky Lower plan.

## 2023-07-30 NOTE — Progress Notes (Signed)
 S:     Chief Complaint  Patient presents with   Medication Management    Amb BP Day #2   48 y.o. male who presents for hypertension evaluation, education, and management.  Patient arrives in good spirits and presents without any assistance.   PMH is significant for T2DM, HTN, TTP.  Patient returns to clinic with 24 hour blood pressure monitor and reports no issues with monitor. Patient reports they were able to wear the Ambulatory Blood Pressure Cuff for the entire 24 evaluation period.   O:  Review of Systems  All other systems reviewed and are negative.   Physical Exam Vitals reviewed.  Constitutional:      Appearance: Normal appearance.  Pulmonary:     Effort: Pulmonary effort is normal.  Neurological:     Mental Status: He is alert.  Psychiatric:        Mood and Affect: Mood normal.        Behavior: Behavior normal.        Thought Content: Thought content normal.        Judgment: Judgment normal.     Last 3 Office BP readings: BP Readings from Last 3 Encounters:  07/30/23 134/83  07/29/23 (!) 152/92  07/22/23 130/84    Clinical Atherosclerotic Cardiovascular Disease (ASCVD): No   Basic Metabolic Panel    Component Value Date/Time   NA 141 07/22/2023 0902   NA 146 (H) 07/15/2023 1551   NA 141 03/23/2017 1612   K 3.5 07/22/2023 0902   K 4.0 03/23/2017 1612   CL 106 07/22/2023 0902   CO2 27 07/22/2023 0902   CO2 24 03/23/2017 1612   GLUCOSE 126 (H) 07/22/2023 0902   GLUCOSE 193 (H) 03/23/2017 1612   BUN 15 07/22/2023 0902   BUN 18 07/15/2023 1551   BUN 22.5 03/23/2017 1612   CREATININE 0.86 07/22/2023 0902   CREATININE 0.99 06/28/2023 1602   CREATININE 1.1 03/23/2017 1612   CALCIUM 9.4 07/22/2023 0902   CALCIUM 10.1 03/23/2017 1612   GFRNONAA >60 07/22/2023 0902   GFRAA 125 06/14/2020 1539   GFRAA >60 12/22/2018 1439    Renal function: Estimated Creatinine Clearance: 116.7 mL/min (by C-G formula based on SCr of 0.86 mg/dL).   ABPM Study  Data: Arm Placement left arm  Overall Mean 24hr BP:   131/81 mmHg  HR: 54  Daytime Mean BP:  134/83 mmHg  HR: 56  Nighttime Mean BP:  117/69 mmHg  HR: 50  Dipping Pattern: Yes.    Sys:   12.5%   Dia: 17.2%   [normal dipping ~10-20%]   For Office Goal BP of <130/80 mmHg:  ABPM thresholds: Overall BP <125/75 mmHg, daytime BP <130/80 mmHg, sleeptime BP <110/65 mmHg   Patient is participating in a Managed Medicaid Plan:  Yes   A/P: History of hypertension longstanding and currently taking two agents, amlodipine and losartan at max doses.  Patient has history of low potassium and remains low despite ARB therapy.  Goal presssure of <130/80 mm Hg. Found to have persistently elevated and uncontrolled blood pressure higher than goal with 24-hour ambulatory blood pressure evaluation which demonstrates an average AWAKE blood pressure of 134/83 mmHg. Nocturnal dipping pattern is normal.   Changes to medications -Continued amlodipine at 10 mg and losartan at 100mg  daily -Started eplerenone 25 mg daily -Stopped potassium supplement  BMET and BP follow-up 08/09/2023  Results reviewed and written information provided.    Written patient instructions provided. Patient verbalized understanding of treatment  plan.  Total time in face to face counseling 18 minutes.    Follow-up:  Pharmacist 08/09/2023 for  PCP clinic visit in TBD

## 2023-07-30 NOTE — Assessment & Plan Note (Signed)
 History of hypertension longstanding and currently taking two agents, amlodipine and losartan at max doses.  Patient has history of low potassium and remains low despite ARB therapy.  Goal presssure of <130/80 mm Hg. Found to have persistently elevated and uncontrolled blood pressure higher than goal with 24-hour ambulatory blood pressure evaluation which demonstrates an average AWAKE blood pressure of 134/83 mmHg. Nocturnal dipping pattern is normal.   Changes to medications -Continued amlodipine at 10 mg and losartan at 100mg  daily -Started eplerenone 25 mg daily -Stopped potassium supplement  BMET and BP follow-up

## 2023-08-03 ENCOUNTER — Ambulatory Visit: Admitting: Pharmacist

## 2023-08-05 ENCOUNTER — Telehealth: Payer: Self-pay

## 2023-08-05 DIAGNOSIS — E119 Type 2 diabetes mellitus without complications: Secondary | ICD-10-CM

## 2023-08-05 NOTE — Telephone Encounter (Signed)
 Patient calls nurse line regarding Jardiance prescription.   He reports that he feels that 25 mg dosage of Jardiance is too strong. He wakes up in the morning with CBG in the 80's, will sometimes have episodes of slight jitteriness. He is asking if he can decrease dose back down to 10 mg, as he felt like this worked better for him.   Will forward to PCP and Dr. Raymondo Band for further advisement.   Veronda Prude, RN

## 2023-08-06 MED ORDER — EMPAGLIFLOZIN 10 MG PO TABS: 10.0000 mg | ORAL_TABLET | Freq: Every day | ORAL | 3 refills | Status: DC

## 2023-08-06 NOTE — Telephone Encounter (Signed)
 Patient contacted for follow-up of glucose control and Jardiance (empagliflozin)  dosing.  Since increasing dose from 10 to 25mg  patient reports symptoms of low glucose readings.  Given recent A1C of 6.5 on previous dose of Jardiance, agreed to reduce dose to previous 10mg .   Medication Plan: -Adjust Jardiance ( empagliflozin) from 25 to 10mg  daily.  New prescription provided.   Total time with patient call and documentation of interaction: 12 minutes.

## 2023-08-06 NOTE — Telephone Encounter (Signed)
 Reviewed and agree with Dr Macky Lower plan.

## 2023-08-09 ENCOUNTER — Ambulatory Visit: Admitting: Pharmacist

## 2023-08-09 ENCOUNTER — Encounter: Payer: Self-pay | Admitting: Pharmacist

## 2023-08-09 VITALS — BP 144/87 | HR 81 | Wt 192.0 lb

## 2023-08-09 DIAGNOSIS — E1159 Type 2 diabetes mellitus with other circulatory complications: Secondary | ICD-10-CM

## 2023-08-09 DIAGNOSIS — I152 Hypertension secondary to endocrine disorders: Secondary | ICD-10-CM | POA: Diagnosis not present

## 2023-08-09 DIAGNOSIS — Z7984 Long term (current) use of oral hypoglycemic drugs: Secondary | ICD-10-CM | POA: Diagnosis not present

## 2023-08-09 NOTE — Progress Notes (Signed)
   S:     Chief Complaint  Patient presents with   Medication Management    BP management   48 y.o. male who presents for hypertension evaluation, education, and management.  PMH is significant for T2DM, HTN, TTP syndrome, seasonal allergies, GERD.   Patient was referred and last seen by Primary Care Provider, Dr. Lansing Planas, on 07/15/2023.   At last visit, addressed hypertension.   Today, patient arrives in good spirits and presents without assistance. Denies dizziness, headache, blurred vision, swelling.   Patient reports hypertension was diagnosed in 2021.   Medication adherence is good. Patient has taken all BP medications today.   Current antihypertensives include: amlodipine 10 mg, eplerenone 25 mg, and losartan 100 mg  Antihypertensives tried in the past include: losartan-hydrochlorothiazide and metoprolol  Reported home BP readings: patient's home BP cuff broken, has a new one coming in the mail soon.  O:   Last 3 Office BP readings: BP Readings from Last 3 Encounters:  08/09/23 (!) 140/90  07/30/23 134/83  07/29/23 (!) 152/92    BMET    Component Value Date/Time   NA 141 07/22/2023 0902   NA 146 (H) 07/15/2023 1551   NA 141 03/23/2017 1612   K 3.5 07/22/2023 0902   K 4.0 03/23/2017 1612   CL 106 07/22/2023 0902   CO2 27 07/22/2023 0902   CO2 24 03/23/2017 1612   GLUCOSE 126 (H) 07/22/2023 0902   GLUCOSE 193 (H) 03/23/2017 1612   BUN 15 07/22/2023 0902   BUN 18 07/15/2023 1551   BUN 22.5 03/23/2017 1612   CREATININE 0.86 07/22/2023 0902   CREATININE 0.99 06/28/2023 1602   CREATININE 1.1 03/23/2017 1612   CALCIUM 9.4 07/22/2023 0902   CALCIUM 10.1 03/23/2017 1612   GFRNONAA >60 07/22/2023 0902   GFRAA 125 06/14/2020 1539   GFRAA >60 12/22/2018 1439    Renal function: Estimated Creatinine Clearance: 107.3 mL/min (by C-G formula based on SCr of 0.86 mg/dL).  Clinical ASCVD: No  The ASCVD Risk score (Arnett DK, et al., 2019) failed to calculate for  the following reasons:   The valid total cholesterol range is 130 to 320 mg/dL   A/P: Hypertension diagnosed in 2021 currently above goals on current medications amlodipine 10 mg, eplerenone 25 mg, and losartan 100 mg. BP goal < 130/80 mmHg. Medication adherence appears to be good. Control is suboptimal due to possible white coat syndrome.  -No medication changes today, may need to increase epelerenone at next visit to 50mg  to achieve blood pressure target control.  -F/u labs ordered - BMET today -Counseled on lifestyle modifications for blood pressure control including reduced dietary sodium, increased exercise, adequate sleep. -Encouraged patient to check BP at home and bring log of readings to next visit. Counseled on proper use of home BP cuff.  -Will readdress HTN medication changes at next visit, consider increasing eplerenone to 50 mg if needed. Patient agreeable with plan.  Results reviewed and written information provided.    Written patient instructions provided. Patient verbalized understanding of treatment plan.  Total time in face to face counseling 28 minutes.    Follow-up:  PCP clinic visit in 6 weeks.   Patient seen with Teofilo Fellers, PharmD Candidate and Jeanine Millers, PharmD Candidate.

## 2023-08-09 NOTE — Patient Instructions (Signed)
 It was nice to see you today!  Your goal blood pressure is <130/80 mmHg.  Medication Changes:  Continue all medications the same.  Monitor blood pressure at home daily and keep a log (on your phone or piece of paper) to bring with you to your next visit. Write down date, time, blood pressure and pulse.  Keep up the good work with diet and exercise. Aim for a diet full of vegetables, fruit and lean meats (chicken, [place], fish). Try to limit salt intake by eating fresh or frozen vegetables (instead of canned), rinse canned vegetables prior to cooking and do not add any additional salt to meals.

## 2023-08-09 NOTE — Progress Notes (Signed)
 Reviewed and agree with Dr Macky Lower plan.

## 2023-08-09 NOTE — Assessment & Plan Note (Signed)
 Hypertension diagnosed in 2021 currently above goals on current medications amlodipine 10 mg, eplerenone 25 mg, and losartan 100 mg. BP goal < 130/80 mmHg. Medication adherence appears to be good. Control is suboptimal due to possible white coat syndrome.  -No medication changes today, may need to increase epelerenone at next visit to 50mg  to achieve blood pressure target control.  -F/u labs ordered - BMET today -Counseled on lifestyle modifications for blood pressure control including reduced dietary sodium, increased exercise, adequate sleep. -Encouraged patient to check BP at home and bring log of readings to next visit. Counseled on proper use of home BP cuff.  -Will readdress HTN medication changes at next visit, consider increasing eplerenone to 50 mg if needed.

## 2023-08-10 ENCOUNTER — Encounter: Payer: Self-pay | Admitting: Oncology

## 2023-08-10 ENCOUNTER — Encounter: Payer: Self-pay | Admitting: Pharmacist

## 2023-08-10 LAB — BASIC METABOLIC PANEL WITH GFR
BUN/Creatinine Ratio: 11 (ref 9–20)
BUN: 11 mg/dL (ref 6–24)
CO2: 23 mmol/L (ref 20–29)
Calcium: 9.7 mg/dL (ref 8.7–10.2)
Chloride: 105 mmol/L (ref 96–106)
Creatinine, Ser: 1 mg/dL (ref 0.76–1.27)
Glucose: 116 mg/dL — ABNORMAL HIGH (ref 70–99)
Potassium: 3.8 mmol/L (ref 3.5–5.2)
Sodium: 143 mmol/L (ref 134–144)
eGFR: 92 mL/min/1.73 (ref >59)

## 2023-08-11 DIAGNOSIS — E119 Type 2 diabetes mellitus without complications: Secondary | ICD-10-CM | POA: Diagnosis not present

## 2023-08-11 DIAGNOSIS — H52223 Regular astigmatism, bilateral: Secondary | ICD-10-CM | POA: Diagnosis not present

## 2023-08-11 DIAGNOSIS — H524 Presbyopia: Secondary | ICD-10-CM | POA: Diagnosis not present

## 2023-08-11 DIAGNOSIS — H5213 Myopia, bilateral: Secondary | ICD-10-CM | POA: Diagnosis not present

## 2023-08-13 ENCOUNTER — Encounter: Payer: Self-pay | Admitting: Dietician

## 2023-08-13 ENCOUNTER — Encounter: Attending: Endocrinology | Admitting: Dietician

## 2023-08-13 VITALS — Ht 70.0 in | Wt 194.0 lb

## 2023-08-13 DIAGNOSIS — E119 Type 2 diabetes mellitus without complications: Secondary | ICD-10-CM | POA: Insufficient documentation

## 2023-08-13 NOTE — Patient Instructions (Addendum)
 Add vegetables with most meals.  1/2 your plate Consider finding ways to be more active. Continue to take your medication Continue great changes made

## 2023-08-13 NOTE — Progress Notes (Signed)
 Diabetes Self-Management Education  Visit Type: First/Initial  Appt. Start Time: 1005 Appt. End Time: 1115  08/13/2023  [name], identified by name and date of birth, is a 48 y.o. male with a diagnosis of Diabetes: Type 2.   ASSESSMENT Patient is here today alone.  Referral:  Type 2 Diabetes  History includes:  Type 2 Diabetes (2018), HTN, HLD, CKD, GERD, stopped smoking (05/2023), TTP (remission) (on steroids for this at times) Medications include:  Jardiance  Labs noted to include:  A1C 6.9% 06/28/2023 and decreased to 6.5% 07/16/2023, eGFR 92,   70" 194 lbs 08/13/2023 185 lbs (age 30)  Patient lives with a fiance.  He does the shopping and there share cooking.   He is currently out of work due to platelets.  He is on disability.  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 Lactose intolerant.  Dislikes beans, oatmeal, grits - texture issues.  Brother is in [employer] and plans on walking with him again.  Height 5\' 10"  (1.778 m), weight 194 lb (88 kg). Body mass index is 27.84 kg/m.   Diabetes Self-Management Education - 08/13/23 1010       Visit Information   Visit Type First/Initial      Initial Visit   Diabetes Type Type 2    Date Diagnosed 2018    Are you currently following a meal plan? Yes    What type of meal plan do you follow? diabetes    Are you taking your medications as prescribed? Yes      Health Coping   How would you rate your overall health? Fair      Psychosocial Assessment   Patient Belief/Attitude about Diabetes Motivated to manage diabetes    What is the hardest part about your diabetes right now, causing you the most concern, or is the most worrisome to you about your diabetes?   Making healty food and beverage choices    Self-care barriers None    Self-management support Doctor's office    Other persons present Patient    Patient Concerns Healthy Lifestyle;Nutrition/Meal planning    Special Needs None    Preferred Learning  Style No preference indicated    Learning Readiness Ready    How often do you need to have someone help you when you read instructions, pamphlets, or other written materials from your doctor or pharmacy? 1 - Never    What is the last grade level you completed in school? 12      Pre-Education Assessment   Patient understands the diabetes disease and treatment process. Needs Instruction    Patient understands incorporating nutritional management into lifestyle. Needs Instruction    Patient undertands incorporating physical activity into lifestyle. Needs Instruction    Patient understands using medications safely. Needs Instruction    Patient understands monitoring blood glucose, interpreting and using results Needs Instruction    Patient understands prevention, detection, and treatment of acute complications. Needs Instruction    Patient understands prevention, detection, and treatment of chronic complications. Needs Instruction    Patient understands how to develop strategies to address psychosocial issues. Needs Instruction    Patient understands how to develop strategies to promote health/change behavior. Needs Instruction      Complications   Last HgB A1C per patient/outside source 6.5 %   07/16/2023 decreased from 06/28/23   How often do you check your blood sugar? 1-2 times/day    Fasting Blood glucose range (mg/dL) 16-109    Postprandial Blood  glucose range (mg/dL) 098-119    Number of hypoglycemic episodes per month 0    Number of hyperglycemic episodes ( >200mg /dL): Rare    Can you tell when your blood sugar is high? No    Have you had a dilated eye exam in the past 12 months? Yes    Have you had a dental exam in the past 12 months? Yes    Are you checking your feet? Yes    How many days per week are you checking your feet? 1      Dietary Intake   Breakfast egg, flat bread (whole grain) OR egg and sausage/bacon OR cereal, milk    Snack (morning) none    Lunch salad, protein,  vinegrette or ranch    Snack (afternoon) 1/2 [place] sandwich on [employer] bread    Dinner BBQ pork, fried rice    Snack (evening) none    Beverage(s) water, sugar free ICE, sugar free tea, turmeric and cinnamin, lemon tea with 1 tsp honey      Activity / Exercise   Activity / Exercise Type ADL's      Patient Education   Previous Diabetes Education Yes (please comment)   call from insurance company   Disease Pathophysiology Definition of diabetes, type 1 and 2, and the diagnosis of diabetes    Healthy Eating Role of diet in the treatment of diabetes and the relationship between the three main macronutrients and blood glucose level;Food label reading, portion sizes and measuring food.;Plate Method;Meal options for control of blood glucose level and chronic complications.    Being Active Role of exercise on diabetes management, blood pressure control and cardiac health.;Helped patient identify appropriate exercises in relation to his/her diabetes, diabetes complications and other health issue.    Medications Reviewed patients medication for diabetes, action, purpose, timing of dose and side effects.    Monitoring Identified appropriate SMBG and/or A1C goals.;Daily foot exams;Yearly dilated eye exam    Acute complications Taught prevention, symptoms, and  treatment of hypoglycemia - the 15 rule.;Discussed and identified patients' prevention, symptoms, and treatment of hyperglycemia.    Chronic complications Relationship between chronic complications and blood glucose control    Diabetes Stress and Support Identified and addressed patients feelings and concerns about diabetes;Worked with patient to identify barriers to care and solutions;Role of stress on diabetes      Individualized Goals (developed by patient)   Nutrition General guidelines for healthy choices and portions discussed    Physical Activity Exercise 5-7 days per week;30 minutes per day    Medications take my medication as prescribed     Monitoring  Test my blood glucose as discussed    Problem Solving Eating Pattern;Addressing barriers to behavior change    Reducing Risk examine blood glucose patterns;do foot checks daily;treat hypoglycemia with 15 grams of carbs if blood glucose less than 70mg /dL      Post-Education Assessment   Patient understands the diabetes disease and treatment process. Comprehends key points    Patient understands incorporating nutritional management into lifestyle. Comprehends key points    Patient undertands incorporating physical activity into lifestyle. Comprehends key points    Patient understands using medications safely. Comphrehends key points    Patient understands monitoring blood glucose, interpreting and using results Comprehends key points    Patient understands prevention, detection, and treatment of acute complications. Comprehends key points    Patient understands prevention, detection, and treatment of chronic complications. Comprehends key points    Patient understands how to  develop strategies to address psychosocial issues. Comprehends key points    Patient understands how to develop strategies to promote health/change behavior. Needs Review      Outcomes   Expected Outcomes Demonstrated interest in learning. Expect positive outcomes    Future DMSE 2 months    Program Status Not Completed             Individualized Plan for Diabetes Self-Management Training:   Learning Objective:  Patient will have a greater understanding of diabetes self-management. Patient education plan is to attend individual and/or group sessions per assessed needs and concerns.   Plan:   Patient Instructions  Add vegetables with most meals.  1/2 your plate Consider finding ways to be more active. Continue to take your medication Continue great changes made  Expected Outcomes:  Demonstrated interest in learning. Expect positive outcomes  Education material provided: ADA - How to Thrive: A  Guide for Your Journey with Diabetes, Food label handouts, Meal plan card, Snack sheet, and Diabetes Resources  If problems or questions, patient to contact team via:  Phone  Future DSME appointment: 2 months

## 2023-08-19 ENCOUNTER — Encounter: Payer: Self-pay | Admitting: Nurse Practitioner

## 2023-08-19 ENCOUNTER — Inpatient Hospital Stay (HOSPITAL_BASED_OUTPATIENT_CLINIC_OR_DEPARTMENT_OTHER): Admitting: Nurse Practitioner

## 2023-08-19 ENCOUNTER — Telehealth: Payer: Self-pay | Admitting: Family Medicine

## 2023-08-19 ENCOUNTER — Inpatient Hospital Stay

## 2023-08-19 ENCOUNTER — Inpatient Hospital Stay: Attending: Oncology

## 2023-08-19 VITALS — BP 134/86 | HR 75 | Temp 98.1°F | Resp 18 | Ht 70.0 in | Wt 194.0 lb

## 2023-08-19 VITALS — BP 130/88 | HR 60 | Temp 97.9°F | Resp 18

## 2023-08-19 DIAGNOSIS — M3119 Other thrombotic microangiopathy: Secondary | ICD-10-CM

## 2023-08-19 DIAGNOSIS — Z5112 Encounter for antineoplastic immunotherapy: Secondary | ICD-10-CM | POA: Diagnosis present

## 2023-08-19 LAB — CBC WITH DIFFERENTIAL (CANCER CENTER ONLY)
Abs Immature Granulocytes: 0.01 10*3/uL (ref 0.00–0.07)
Basophils Absolute: 0 10*3/uL (ref 0.0–0.1)
Basophils Relative: 0 %
Eosinophils Absolute: 0.2 10*3/uL (ref 0.0–0.5)
Eosinophils Relative: 3 %
HCT: 47.8 % (ref 39.0–52.0)
Hemoglobin: 16.8 g/dL (ref 13.0–17.0)
Immature Granulocytes: 0 %
Lymphocytes Relative: 45 %
Lymphs Abs: 3.5 10*3/uL (ref 0.7–4.0)
MCH: 31.4 pg (ref 26.0–34.0)
MCHC: 35.1 g/dL (ref 30.0–36.0)
MCV: 89.3 fL (ref 80.0–100.0)
Monocytes Absolute: 0.7 10*3/uL (ref 0.1–1.0)
Monocytes Relative: 8 %
Neutro Abs: 3.5 10*3/uL (ref 1.7–7.7)
Neutrophils Relative %: 44 %
Platelet Count: 190 10*3/uL (ref 150–400)
RBC: 5.35 MIL/uL (ref 4.22–5.81)
RDW: 12.9 % (ref 11.5–15.5)
WBC Count: 7.9 10*3/uL (ref 4.0–10.5)
nRBC: 0 % (ref 0.0–0.2)

## 2023-08-19 LAB — CMP (CANCER CENTER ONLY)
ALT: 29 U/L (ref 0–44)
AST: 20 U/L (ref 15–41)
Albumin: 4.4 g/dL (ref 3.5–5.0)
Alkaline Phosphatase: 181 U/L — ABNORMAL HIGH (ref 38–126)
Anion gap: 9 (ref 5–15)
BUN: 14 mg/dL (ref 6–20)
CO2: 25 mmol/L (ref 22–32)
Calcium: 9.6 mg/dL (ref 8.9–10.3)
Chloride: 105 mmol/L (ref 98–111)
Creatinine: 0.99 mg/dL (ref 0.61–1.24)
GFR, Estimated: >60 mL/min (ref >60)
Glucose, Bld: 109 mg/dL — ABNORMAL HIGH (ref 70–99)
Potassium: 3.4 mmol/L — ABNORMAL LOW (ref 3.5–5.1)
Sodium: 139 mmol/L (ref 135–145)
Total Bilirubin: 1 mg/dL (ref 0.0–1.2)
Total Protein: 6.3 g/dL — ABNORMAL LOW (ref 6.5–8.1)

## 2023-08-19 LAB — LACTATE DEHYDROGENASE: LDH: 168 U/L (ref 98–192)

## 2023-08-19 MED ORDER — SODIUM CHLORIDE 0.9 % IV SOLN
375.0000 mg/m2 | Freq: Once | INTRAVENOUS | Status: AC
  Administered 2023-08-19: 800 mg via INTRAVENOUS
  Filled 2023-08-19: qty 50

## 2023-08-19 MED ORDER — DIPHENHYDRAMINE HCL 25 MG PO CAPS
50.0000 mg | ORAL_CAPSULE | Freq: Once | ORAL | Status: AC
Start: 2023-08-19 — End: 2023-08-19
  Administered 2023-08-19: 50 mg via ORAL
  Filled 2023-08-19: qty 2

## 2023-08-19 MED ORDER — ACETAMINOPHEN 325 MG PO TABS
650.0000 mg | ORAL_TABLET | Freq: Once | ORAL | Status: AC
  Administered 2023-08-19: 650 mg via ORAL
  Filled 2023-08-19: qty 2

## 2023-08-19 MED ORDER — SODIUM CHLORIDE 0.9 % IV SOLN: INTRAVENOUS | Status: DC

## 2023-08-19 NOTE — Progress Notes (Signed)
 [facility] OFFICE PROGRESS NOTE   Diagnosis: TTP  INTERVAL HISTORY:   Mr. Keltner returns as scheduled.  He completed another cycle of rituximab  07/22/2023.  Overall feels well.  No signs of a reaction with Rituxan  infusion.  He denies bleeding except periodic small amount of blood with nose blowing.  He has an occasional mild headache.  Objective:  Vital signs in last 24 hours:  Blood pressure 134/86, pulse 75, temperature 98.1 F (36.7 C), temperature source Temporal, resp. rate 18, height 5\' 10"  (1.778 m), weight 194 lb (88 kg), SpO2 100%.    HEENT: No thrush or ulcers. Resp: Lungs clear bilaterally. Cardio: Regular rate and rhythm. GI: No hepatosplenomegaly. Vascular: No leg edema. Neuro: Alert and oriented.    Lab Results:  Lab Results  Component Value Date   WBC 7.9 08/19/2023   HGB 16.8 08/19/2023   HCT 47.8 08/19/2023   MCV 89.3 08/19/2023   PLT 190 08/19/2023   NEUTROABS 3.5 08/19/2023    Imaging:  No results found.  Medications: I have reviewed the patient's current medications.  Assessment/Plan: TTP initially diagnosed in October 2018 with recurrence in April 2020 and July 2023. Initiation of daily plasma exchange and Solu-Medrol  02/02/2017 [name] Antibody- 72, activity less than 2% on initial presentation; [name] from 08/15/2018 <2.0. Last plasma exchange 02/06/2017 Prednisone  60 mg daily beginning 02/08/2017 Prednisone  40 mg daily beginning 02/17/2017 Prednisone  30 mg daily beginning 03/09/2017 Prednisone  20 mg daily beginning 03/23/2017 Prednisone  10 mg daily beginning 04/01/2017 Prednisone  5 mg daily for 5 days then 5 mg every other day for 5 doses then stop beginning 05/11/2017 Recurrent TTP 08/14/2018 Plasma exchange/steroids started on 08/15/2018.  Plasma exchange discontinued after treatment on 08/19/2019, discharged on prednisone  60mg  daily Prednisone  taper to 40 mg daily 08/22/2018 Weekly rituximab  for 4 doses beginning  08/25/2018 Plasma exchange resumed 08/26/2018, 08/27/2018, 08/28/2018, 08/29/2018, 08/31/2018, 09/02/2018 Prednisone  taper to 30 mg daily 09/01/2018 Cycle 3 weekly Rituxan  09/08/2018 Prednisone  taper to 20 mg daily 09/08/2018 Cycle 4 weekly Rituxan  09/15/2018 Prednisone  taper to 10 mg daily 09/20/2018 Prednisone  taper to 5 mg daily 09/27/2018 Prednisone  discontinued 10/07/2018 Admission with relapse of TTP 10/23/2021 Daily Plasma exchange initiated 11/23/2021 prednisone  8/1 Prednisone  tapered to 40 mg daily beginning 12/04/2021 Weekly Rituxan  for 4 doses beginning 12/04/2021 Discontinue plasmapheresis after appointment 12/05/2021 Admitted to [facility] 12/11/2021-plasmapheresis reinitiated, Cablivi started 12/12/2021, prednisone  dose currently 80 mg daily Every other day plasma exchange beginning 12/28/2021-completed 01/09/2022 Second weekly Rituxan  12/31/2021 Prednisone  taper to 60 mg daily beginning 12/31/2021 Third weekly Rituxan  01/06/2022 Prednisone  taper to 40 mg daily beginning 01/07/2022 Fourth weekly rituximab  01/13/2022 Prednisone  taper to 30 mg daily 01/13/2022 Prednisone  taper to 20 mg daily 01/20/2022 Prednisone  taper to 15 mg daily 02/04/2022 Per patient report 02/19/2022 he has continued prednisone  30 mg daily and did not make the above adjustments, he will decrease prednisone  to 20 mg daily today Prednisone  taper to 10 mg daily 03/09/2022 Prednisone  taper to 5 mg daily 03/26/2022 Prednisone  taper to 5 mg every other day for 5 doses beginning 04/16/2022 ADAMTS 13 :14.2 on 04/02/2023, 7.2 on 04/08/2023 04/22/2023-rituximab  [name]: 54.6 on 05/17/2023 [name] 06/18/2023-8.9%, antibody-2 06/24/2023-rituximab  [name] 64 on 07/22/2023 07/22/2023-rituximab  08/19/2023-rituximab  2. Xiphoid pain-etiology unclear 3. Hypertension 4. Diabetes 5. Alcohol/Tobacco use 6. History of renal insufficiency 7. Right IJ dialysis catheter placed 02/02/2017, removed New right IJ dialysis catheter 08/15/2018 Catheter removed  09/12/2018 New right IJ dialysis catheter placed 11/23/2021 Temporary hemodialysis catheter converted to tunneled hemodialysis catheter 11/28/2021    Disposition:  Mr. Barretta appears stable.  He has completed 3 monthly rituximab  infusions.  He is tolerating well.  Most recent ADAMTS activity level was improved.  Plan for cycle 4 rituximab  today as scheduled.  We reviewed the overall plan to complete 6 monthly rituximab  infusions and then adjust to an every 49-month schedule.  He agrees with the above.  CBC reviewed.  Hemoglobin and platelet count remain stable in normal range.  He will return for follow-up and Rituximab  in 4 weeks.  We are available to see him sooner if needed.    Diana Forster ANP/GNP-BC   08/19/2023  8:36 AM

## 2023-08-19 NOTE — Telephone Encounter (Signed)
 After-hours call  Patient calls with concerns for missing his losartan  dose last night.  He usually takes amlodipine  at night and eplerenone  in the morning.  He wanted to know if it would be okay to take his losartan  this morning as well.  Advised the patient can take losartan  this morning with his eplerenone .  He can take his amlodipine  tonight.  Restart losartan  dosing at bedtime tomorrow night.  Discussed letting us  know if he has any lightheadedness or dizziness, though recent blood pressures recorded in the office make this unlikely.

## 2023-08-19 NOTE — Progress Notes (Signed)
 Patient seen by Diana Forster NP today  Vitals are within treatment parameters:Yes   Labs are within treatment parameters: Yes Alkaline phosphate 181  Treatment plan has been signed: Yes   Per physician team, Patient is ready for treatment and there are NO modifications to the treatment plan.

## 2023-08-20 LAB — ADAMTS13 ACTIVITY: Adamts 13 Activity: 89.8 % (ref >66.8)

## 2023-08-20 LAB — ADAMTS13 ACTIVITY REFLEX

## 2023-08-23 ENCOUNTER — Ambulatory Visit (INDEPENDENT_AMBULATORY_CARE_PROVIDER_SITE_OTHER): Payer: Self-pay | Admitting: Allergy & Immunology

## 2023-08-23 ENCOUNTER — Encounter: Payer: Self-pay | Admitting: Allergy & Immunology

## 2023-08-23 ENCOUNTER — Ambulatory Visit: Payer: Self-pay | Admitting: Allergy & Immunology

## 2023-08-23 ENCOUNTER — Encounter: Payer: Self-pay | Admitting: Oncology

## 2023-08-23 ENCOUNTER — Telehealth: Payer: Self-pay | Admitting: *Deleted

## 2023-08-23 ENCOUNTER — Other Ambulatory Visit: Payer: Self-pay

## 2023-08-23 VITALS — BP 124/80 | HR 84 | Temp 98.2°F | Resp 18 | Ht 68.5 in | Wt 189.6 lb

## 2023-08-23 DIAGNOSIS — K219 Gastro-esophageal reflux disease without esophagitis: Secondary | ICD-10-CM | POA: Diagnosis not present

## 2023-08-23 DIAGNOSIS — J310 Chronic rhinitis: Secondary | ICD-10-CM | POA: Diagnosis not present

## 2023-08-23 DIAGNOSIS — F17200 Nicotine dependence, unspecified, uncomplicated: Secondary | ICD-10-CM

## 2023-08-23 DIAGNOSIS — E1159 Type 2 diabetes mellitus with other circulatory complications: Secondary | ICD-10-CM

## 2023-08-23 DIAGNOSIS — L739 Follicular disorder, unspecified: Secondary | ICD-10-CM

## 2023-08-23 DIAGNOSIS — M3119 Other thrombotic microangiopathy: Secondary | ICD-10-CM

## 2023-08-23 DIAGNOSIS — I152 Hypertension secondary to endocrine disorders: Secondary | ICD-10-CM

## 2023-08-23 MED ORDER — MONTELUKAST SODIUM 10 MG PO TABS: 10.0000 mg | ORAL_TABLET | Freq: Every day | ORAL | 0 refills | Status: DC

## 2023-08-23 NOTE — Patient Instructions (Addendum)
 1. Chronic rhinitis - Because of insurance stipulations, we cannot do skin testing on the same day as your first visit. - We are all working to fight this, but for now we need to do two separate visits.  - We will know more after we do testing at the next visit.  - The skin testing visit can be squeezed in at your convenience.  - Then we can make a more full plan to address all of your symptoms. - Be sure to stop your antihistamines for 3 days before this appointment.  - Information on allergy shots provided.  - We can plan to start that after the testing.  - Start Singulair (montelukast) 10mg  daily. - This can rarely cause symptoms of irritability and bad dreams, so beware of that.   2. Folliculitis/acne  - Continue with the clindamycin  lotion.  3. GERD - Continue Nexium  daily.   4. Return in about 1 week (around 08/30/2023) for SKIN TESTING (1-55). You can have the follow up appointment with Dr. Idolina Maker or a Nurse Practicioner (our Nurse Practitioners are excellent and always have Physician oversight!).    Please inform us  of any Emergency Department visits, hospitalizations, or changes in symptoms. Call us  before going to the ED for breathing or allergy symptoms since we might be able to fit you in for a sick visit. Feel free to contact us  anytime with any questions, problems, or concerns.  It was a pleasure to meet you and your family today!  Websites that have reliable patient information: 1. American Academy of Asthma, Allergy, and Immunology: www.aaaai.org 2. Food Allergy Research and Education (FARE): foodallergy.org 3. Mothers of Asthmatics: http://www.asthmacommunitynetwork.org 4. American College of Allergy, Asthma, and Immunology: www.acaai.org      "Like" us  on Facebook and Instagram for our latest updates!      A healthy democracy works best when [employer] participate! Make sure you are registered to vote! If you have moved or changed any of your contact  information, you will need to get this updated before voting! Scan the QR codes below to learn more!

## 2023-08-23 NOTE — Telephone Encounter (Signed)
-----   Message from Coni Deep sent at 08/22/2023 11:22 AM EDT ----- Please call patient, the [name] level is normal, f/u as scheduled

## 2023-08-23 NOTE — Telephone Encounter (Signed)
 Made aware of normal Adams13 result. F/U as scheduled.

## 2023-08-23 NOTE — Progress Notes (Unsigned)
 NEW PATIENT  Date of Service/Encounter:  08/23/23  Consult requested by: Homer Lust, MD   Assessment:   Chronic rhinitis  Folliculitis  GERD - on Nexium   Previously on allergy shots when he lived in [place]    Complicated past medical history, including TTP treated with prednisone  and rituximab   Plan/Recommendations:   1. Chronic rhinitis - Because of insurance stipulations, we cannot do skin testing on the same day as your first visit. - We are all working to fight this, but for now we need to do two separate visits.  - We will know more after we do testing at the next visit.  - The skin testing visit can be squeezed in at your convenience.  - Then we can make a more full plan to address all of your symptoms. - Be sure to stop your antihistamines for 3 days before this appointment.  - Information on allergy shots provided.  - We can plan to start that after the testing.  - Start Singulair (montelukast) 10mg  daily. - This can rarely cause symptoms of irritability and bad dreams, so beware of that.   2. Folliculitis/acne  - Continue with the clindamycin  lotion.  3. GERD - Continue Nexium  daily.   4. Return in about 1 week (around 08/30/2023) for SKIN TESTING (1-55). You can have the follow up appointment with Dr. Idolina Maker or a Nurse Practicioner (our Nurse Practitioners are excellent and always have Physician oversight!).    This note in its entirety was forwarded to the Provider who requested this consultation.  Subjective:   Eric Hooper is a 48 y.o. male presenting today for evaluation of  Chief Complaint  Patient presents with  . Allergic Rhinitis     Moved from [place]  and was on allergy shots and been off of allergy shots for 8 years with no relief     Eric Hooper has a history of the following: Patient Active Problem List   Diagnosis Date Noted  . Headache 07/23/2022  . Cervical disc disorder with radiculopathy of cervical region  06/20/2020  . Seasonal allergic rhinitis 03/05/2020  . Chronic left shoulder pain 11/27/2019  . GERD (gastroesophageal reflux disease) 08/10/2019  . Hypertension associated with diabetes (HCC) 08/10/2019  . Insomnia 06/09/2019  . PICC (peripherally inserted central catheter) in place 09/05/2018  . Mixed diabetic hyperlipidemia associated with type 2 diabetes mellitus (HCC) 02/28/2018  . Type 2 diabetes mellitus without complications (HCC) 02/25/2018  . Tobacco use disorder 04/13/2017  . T.T.P. syndrome (HCC) 02/06/2017  . Thrombocytopenia (HCC) 02/01/2017    History obtained from: chart review and patient.  Discussed the use of AI scribe software for clinical note transcription with the patient and/or guardian, who gave verbal consent to proceed.  Eric Hooper was referred by Homer Lust, MD.     Eric Hooper is a 48 y.o. male presenting for an evaluation of environmental allergies .  Allergic Rhinitis Symptom History: He experiences sinus pain and pressure, describing it as his face and head 'blowing up.' He has a long-standing history of allergies, having tested positive for multiple allergens including trees, grass, hay, and animals while living in [place] . He was on allergy shots for about three years, which he felt were helpful. Currently, he takes cetirizine , but nasal sprays and saline have not been effective. His symptoms include swelling of the sinuses, ear pain, and difficulty swallowing due to mucus accumulation, occurring throughout the day, starting from the morning. He is doing cetirizine  daily. He is also  on two nasal sprays that don't work.   Skin Symptom History: He denies having asthma but reports recent onset of eczema, which he attributes to steroid use. He has been prescribed a cream and wash for the eczema, which he feels have been beneficial.  GERD Symptom History: He experiences gastrointestinal symptoms, including heartburn, for which he takes Nexium . He has been  on this medication for years and has previously tried Pepcid  in combination, but without significant improvement. He underwent an endoscopy and colonoscopy recently, which did not reveal any abnormalities.  He has a history of thrombotic thrombocytopenic purpura (TTP) diagnosed in 2020, for which he is currently on rituximab . His platelet count has dropped as low as four in the past, but he has not required platelet transfusions recently. He was previously on prednisone , which he believes contributed to the development of eczema. He uses clindamycin  lotion and a wash for his skin, which has helped with the eczema.  His family history is significant for allergies in both his parents. He has stepchildren who do not have allergies. 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   Otherwise, there is no history of other atopic diseases, including drug allergies, stinging insect allergies, or contact dermatitis. There is no significant infectious history. Vaccinations are up to date.    Past Medical History: Patient Active Problem List   Diagnosis Date Noted  . Headache 07/23/2022  . Cervical disc disorder with radiculopathy of cervical region 06/20/2020  . Seasonal allergic rhinitis 03/05/2020  . Chronic left shoulder pain 11/27/2019  . GERD (gastroesophageal reflux disease) 08/10/2019  . Hypertension associated with diabetes (HCC) 08/10/2019  . Insomnia 06/09/2019  . PICC (peripherally inserted central catheter) in place 09/05/2018  . Mixed diabetic hyperlipidemia associated with type 2 diabetes mellitus (HCC) 02/28/2018  . Type 2 diabetes mellitus without complications (HCC) 02/25/2018  . Tobacco use disorder 04/13/2017  . T.T.P. syndrome (HCC) 02/06/2017  . Thrombocytopenia (HCC) 02/01/2017    Medication List:  Allergies as of 08/23/2023       Reactions   Bee Venom Anaphylaxis   Ibuprofen Other (See Comments)   Drops platelets/  thrombocytopenia        Medication List        Accurate as of August 23, 2023  4:51 PM. If you have any questions, ask your nurse or doctor.          acetaminophen  325 MG tablet Commonly known as: TYLENOL  Take 2 tablets (650 mg total) by mouth every 4 (four) hours as needed (discomfort or fever).   amLODipine  10 MG tablet Commonly known as: NORVASC  Take 1 tablet (10 mg total) by mouth at bedtime.   aspirin  EC 81 MG tablet Take 1 tablet (81 mg total) by mouth daily. Swallow whole.   atorvastatin  40 MG tablet Commonly known as: LIPITOR TAKE 1 TABLET BY MOUTH EVERY DAY   benzoyl peroxide  5 % gel Apply topically 2 (two) times daily.   blood glucose meter kit and supplies Dispense based on patient and insurance preference. Use up to four times daily as directed. (FOR ICD-10 E10.9, E11.9).   cetirizine  10 MG tablet Commonly known as: ZYRTEC  Take 10 mg by mouth daily.   clindamycin  1 % lotion Commonly known as: Cleocin -T Apply topically daily.   empagliflozin  10 MG Tabs tablet Commonly known as: JARDIANCE  Take 1 tablet (10 mg total) by mouth daily.   EPINEPHrine  0.3 mg/0.3 mL Soaj injection Commonly known as:  EPI-PEN Inject 0.3 mg into the muscle as needed for anaphylaxis.   eplerenone  25 MG tablet Commonly known as: INSPRA  Take 1 tablet (25 mg total) by mouth at bedtime.   erythromycin  with ethanol 2 % gel Commonly known as: EMGEL  Apply daily to affected areas   esomeprazole  40 MG capsule Commonly known as: NEXIUM  TAKE 1 CAPSULE (40 MG TOTAL) BY MOUTH 2 (TWO) TIMES DAILY BEFORE A MEAL.   losartan  100 MG tablet Commonly known as: COZAAR  Take 1 tablet (100 mg total) by mouth at bedtime.   montelukast 10 MG tablet Commonly known as: Singulair Take 1 tablet (10 mg total) by mouth at bedtime. Started by: Eric Hooper   multivitamin capsule Take 1 capsule by mouth daily.   Tazarotene  0.1 % Foam Apply 1 Application topically every other day.    tretinoin  0.05 % cream Commonly known as: RETIN-A  Apply topically at bedtime. USE EVERY OTHER NIGHT FOR 1 MONTH, AFTER ONE MONTH INCREASE USE TO NIGHTLY        Birth History: non-contributory  Developmental History: non-contributory  Past Surgical History: Past Surgical History:  Procedure Laterality Date  . COLONOSCOPY    . ELBOW SURGERY Right   . IR FLUORO GUIDE CV LINE RIGHT  02/02/2017  . IR FLUORO GUIDE CV LINE RIGHT  11/23/2021  . IR FLUORO GUIDE CV LINE RIGHT  11/28/2021  . IR REMOVAL TUN CV CATH W/O FL  02/26/2022  . IR US  GUIDE VASC ACCESS RIGHT  02/02/2017  . IR US  GUIDE VASC ACCESS RIGHT  11/23/2021  . UPPER GASTROINTESTINAL ENDOSCOPY       Family History: Family History  Problem Relation Age of Onset  . Allergic rhinitis Mother   . Hypertension Mother   . Hyperlipidemia Mother   . Allergic rhinitis Father   . Diabetes type II Father   . Colon cancer Maternal Aunt   . Esophageal cancer Neg Hx   . Rectal cancer Neg Hx   . Stomach cancer Neg Hx      Social History: Elven lives at home with ***.    Review of systems otherwise negative other than that mentioned in the HPI.    Objective:   Blood pressure 124/80, pulse 84, temperature 98.2 F (36.8 C), temperature source Temporal, resp. rate 18, height 5' 8.5" (1.74 m), weight 189 lb 9.6 oz (86 kg), SpO2 97%. Body mass index is 28.41 kg/m.     Physical Exam   Diagnostic studies: deferred due to insurance stipulations that require a separate visit for testing          Drexel Gentles, MD [facility] 

## 2023-08-24 ENCOUNTER — Encounter: Payer: Self-pay | Admitting: Allergy & Immunology

## 2023-08-26 ENCOUNTER — Other Ambulatory Visit: Payer: Self-pay | Admitting: Family Medicine

## 2023-08-26 DIAGNOSIS — E1159 Type 2 diabetes mellitus with other circulatory complications: Secondary | ICD-10-CM

## 2023-08-30 ENCOUNTER — Ambulatory Visit (INDEPENDENT_AMBULATORY_CARE_PROVIDER_SITE_OTHER): Admitting: Allergy & Immunology

## 2023-08-30 ENCOUNTER — Encounter: Payer: Self-pay | Admitting: Physical Medicine and Rehabilitation

## 2023-08-30 ENCOUNTER — Ambulatory Visit: Admitting: Physical Medicine and Rehabilitation

## 2023-08-30 DIAGNOSIS — M5412 Radiculopathy, cervical region: Secondary | ICD-10-CM | POA: Diagnosis not present

## 2023-08-30 DIAGNOSIS — J3089 Other allergic rhinitis: Secondary | ICD-10-CM

## 2023-08-30 DIAGNOSIS — G8929 Other chronic pain: Secondary | ICD-10-CM | POA: Diagnosis not present

## 2023-08-30 DIAGNOSIS — M25512 Pain in left shoulder: Secondary | ICD-10-CM

## 2023-08-30 DIAGNOSIS — J310 Chronic rhinitis: Secondary | ICD-10-CM

## 2023-08-30 DIAGNOSIS — M3119 Other thrombotic microangiopathy: Secondary | ICD-10-CM

## 2023-08-30 DIAGNOSIS — L739 Follicular disorder, unspecified: Secondary | ICD-10-CM

## 2023-08-30 DIAGNOSIS — J302 Other seasonal allergic rhinitis: Secondary | ICD-10-CM

## 2023-08-30 NOTE — Progress Notes (Signed)
 Pain Scale   Average Pain 10 Patient states his neck has been affecting his shoulder x 6 years.        +Driver, -BT, -Dye Allergies.

## 2023-08-30 NOTE — Progress Notes (Signed)
 Eric Hooper - 48 y.o. male MRN [id number]  Date of birth: [birth year]  Office Visit Note: Visit Date: 08/30/2023 PCP: Homer Lust, MD Referred by: Kandis Ormond, DO  Subjective: Chief Complaint  Patient presents with   Neck - Pain   HPI: Eric Hooper is a 48 y.o. male who comes in today for evaluation of chronic, worsening and severe bilateral neck pain radiating to left shoulder down to forearm. He is former patient of Dr. Tommy Frames. Pain ongoing for several years, his pain has become worse over the last several months. Pain increases with movement and activity. He describes pain as sore and aching, currently rates as 8 out of 10. Some relief of pain with home exercise regimen, rest and use of medications. History of formal physical therapy for his neck, minimal relief of pain of these treatments. Cervical MRI imaging from 2022 shows multi level degenerative changes and foraminal stenosis. There is moderate bilateral C5 through C7 foraminal narrowing. No high grade spinal canal stenosis noted. He was previously treated by Dr. Rendell Carrel with [facility] where he underwent left shoulder injection without improvement of pain.  Left shoulder radiographs from 2024 show mild to moderate degenerative AC joint spurring. He also underwent left C6-C7 interlaminar epidural steroid injection at [facility] in 2022 with minimal relief of pain. Dr. Murrel Arnt planned for ACDF at C5-C6, however patient was unable to proceed with surgery due to issues with TTP. Patient denies focal weakness, numbness and tingling. No recent trauma or falls.       Review of Systems  Musculoskeletal:  Positive for neck pain.  Neurological:  Negative for tingling, sensory change, focal weakness and weakness.  All other systems reviewed and are negative.  Otherwise per HPI.  Assessment & Plan: Visit Diagnoses:    ICD-10-CM   1. Radiculopathy, cervical region  M54.12 MR CERVICAL SPINE WO CONTRAST     2. Chronic left shoulder pain  M25.512    G89.29     3. T.T.P. syndrome (HCC)  M31.19        Plan: Findings:  Chronic, worsening and severe left sided neck pain radiating to shoulder and down to forearm.  Patient continues to have severe pain despite good conservative therapy such as formal physical therapy, home exercise regimen, rest and use of medications.  Patient's clinical presentation and exam are complex, differentials include cervical radiculopathy versus intrinsic left shoulder problem.  Cervical MRI imaging from 2022 does show multilevel findings.  Prior disc herniation at the level of C5-C6.  Minimal relief of pain with prior cervical epidural steroid injection and left shoulder injection.  We discussed treatment plan in detail today.  Next step is to obtain new cervical MRI imaging.  His exam today is non-focal, good strength noted to bilateral upper extremities, no myelopathic symptoms noted.  Would hold on further injections at this time due to his history of TTP.  His most recent platelet count was 190 in April of this year.  Would also consider having him regroup with formal physical therapy.  I will see him back for cervical MRI review to discuss further treatment options.  If we feel his case is more surgical we would have him follow-up with our spine surgeon Dr. Colette Davies for evaluation.  No red flag symptoms noted upon exam today.    Meds & Orders: No orders of the defined types were placed in this encounter.   Orders Placed This Encounter  Procedures   MR  CERVICAL SPINE WO CONTRAST    Follow-up: Return for Cervical MRI review.   Procedures: No procedures performed      Clinical History: Narrative & Impression CLINICAL DATA:  Initial evaluation for left-sided neck and shoulder pain with tingling in left hand and fingers for 8 months.   EXAM: MRI CERVICAL SPINE WITHOUT CONTRAST   TECHNIQUE: Multiplanar, multisequence MR imaging of the cervical spine  was performed. No intravenous contrast was administered.   COMPARISON:  None available.   FINDINGS: Alignment: Straightening of the normal cervical lordosis. Trace anterolisthesis of C3 on C4. Alignment otherwise normal.   Vertebrae: Vertebral body height maintained without acute or chronic fracture. Bone marrow signal intensity within normal limits. 8 mm benign hemangioma noted within the C2 vertebral body. No other discrete or worrisome osseous lesions. No abnormal marrow edema.   Cord: Normal signal and morphology.   Posterior Fossa, vertebral arteries, paraspinal tissues: Visualized brain and posterior fossa within normal limits. Craniocervical junction normal. Paraspinous and prevertebral soft tissues within normal limits. Normal intravascular flow voids seen within the vertebral arteries bilaterally.   Disc levels:   C2-C3: Unremarkable.   C3-C4: Trace anterolisthesis. Small central disc protrusion indents the ventral thecal sac, slightly eccentric to the right. Superimposed mild uncovertebral and left-sided facet hypertrophy. No significant spinal stenosis. Mild right C4 foraminal narrowing. Left neural foramina remains patent.   C4-C5: Mild diffuse disc bulge with bilateral uncovertebral hypertrophy. Central disc osteophyte complex indents the ventral thecal sac, slightly eccentric to the right. Mild spinal stenosis with minimal flattening of the ventral cord. Moderate right worse than left C5 foraminal stenosis.   C5-C6: Mild disc bulge with uncovertebral hypertrophy. Central disc protrusion indents the ventral thecal sac, slightly eccentric to the right (series 9, image 20). Mild spinal stenosis with minimal flattening of the ventral cord. Moderate bilateral C6 foraminal narrowing.   C6-C7: Shallow right paracentral disc protrusion mildly indents the right ventral thecal sac (series 9, image 25). No significant spinal stenosis or cord deformity. Superimposed  uncovertebral hypertrophy with resultant mild bilateral C7 foraminal stenosis.   C7-T1: Negative interspace. Minimal facet hypertrophy. No canal or foraminal stenosis.   Visualized upper thoracic spine demonstrates no significant finding.   IMPRESSION: 1. Multilevel cervical spondylosis with resultant mild spinal stenosis at C4-5 and C5-6. 2. Multifactorial degenerative changes with resultant multilevel foraminal narrowing as above. Notable findings include mild right C4 foraminal stenosis, with moderate bilateral C5 through C7 foraminal narrowing.     Electronically Signed   By: Virgia Griffins M.D.   On: 07/14/2020 23:43   He reports that he has quit smoking. His smoking use included cigarettes. He has a 7.5 pack-year smoking history. He has been exposed to tobacco smoke. He has never used smokeless tobacco.  Recent Labs    03/04/23 0947 06/28/23 1602 07/15/23 1442  HGBA1C 6.1* 6.9* 6.5    Objective:  VS:  HT:    WT:   BMI:     BP:   HR: bpm  TEMP: ( )  RESP:  Physical Exam Vitals and nursing note reviewed.  HENT:     Head: Normocephalic and atraumatic.     Right Ear: External ear normal.     Left Ear: External ear normal.     Nose: Nose normal.     Mouth/Throat:     Mouth: Mucous membranes are moist.  Eyes:     Extraocular Movements: Extraocular movements intact.  Cardiovascular:     Rate and Rhythm: Normal rate.  Pulses: Normal pulses.  Pulmonary:     Effort: Pulmonary effort is normal.  Abdominal:     General: Abdomen is flat. There is no distension.  Musculoskeletal:        General: Tenderness present.     Cervical back: Tenderness present.     Comments: Discomfort noted with flexion, extension and side-to-side rotation. Patient has good strength in the upper extremities including 5 out of 5 strength in wrist extension, long finger flexion and APB. Decreased range of motion and pain with abduction of left shoulder. Full ROM to right shoulder.  There is no atrophy of the hands intrinsically. Sensation intact bilaterally. Negative Hoffman's sign. Negative Spurling's sign.     Skin:    General: Skin is warm and dry.     Capillary Refill: Capillary refill takes less than 2 seconds.  Neurological:     General: No focal deficit present.     Mental Status: He is alert and oriented to person, place, and time.  Psychiatric:        Mood and Affect: Mood normal.        Behavior: Behavior normal.     Ortho Exam  Imaging: No results found.  Past Medical/Family/Surgical/Social History: Medications & Allergies reviewed per EMR, new medications updated. Patient Active Problem List   Diagnosis Date Noted   Headache 07/23/2022   Cervical disc disorder with radiculopathy of cervical region 06/20/2020   Seasonal allergic rhinitis 03/05/2020   Chronic left shoulder pain 11/27/2019   GERD (gastroesophageal reflux disease) 08/10/2019   Hypertension associated with diabetes (HCC) 08/10/2019   Insomnia 06/09/2019   PICC (peripherally inserted central catheter) in place 09/05/2018   Mixed diabetic hyperlipidemia associated with type 2 diabetes mellitus (HCC) 02/28/2018   Type 2 diabetes mellitus without complications (HCC) 02/25/2018   Tobacco use disorder 04/13/2017   T.T.P. syndrome (HCC) 02/06/2017   Thrombocytopenia (HCC) 02/01/2017   Past Medical History:  Diagnosis Date   At risk for dysfunction of heart 07/11/2018   Chest wall pain 08/14/2018   Controlled diabetes mellitus type 2 with complications (HCC) 02/25/2018   Diabetes mellitus (HCC) 02/01/2017   Diabetes mellitus without complication (HCC)    non-insulin  dependent   Diverticulosis 02/01/2017   on CT scan abd/pelvis   Eczema    GERD (gastroesophageal reflux disease)    Hypertension    Kidney stones 02/03/2017   Leg swelling 05/12/2017   Macrocytic anemia 02/01/2017   Microscopic hematuria 02/01/2017   Mixed hyperlipidemia 02/28/2018   Phlebitis 05/12/2017    Recurrent upper respiratory infection (URI)    Renal insufficiency 02/03/2017   Smoker 04/13/2017   Family History  Problem Relation Age of Onset   Allergic rhinitis Mother    Hypertension Mother    Hyperlipidemia Mother    Allergic rhinitis Father    Diabetes type II Father    Colon cancer Maternal Aunt    Esophageal cancer Neg Hx    Rectal cancer Neg Hx    Stomach cancer Neg Hx    Past Surgical History:  Procedure Laterality Date   COLONOSCOPY     ELBOW SURGERY Right    IR FLUORO GUIDE CV LINE RIGHT  02/02/2017   IR FLUORO GUIDE CV LINE RIGHT  11/23/2021   IR FLUORO GUIDE CV LINE RIGHT  11/28/2021   IR REMOVAL TUN CV CATH W/O FL  02/26/2022   IR US  GUIDE VASC ACCESS RIGHT  02/02/2017   IR US  GUIDE VASC ACCESS RIGHT  11/23/2021   UPPER  GASTROINTESTINAL ENDOSCOPY     Social History   Occupational History   Occupation: Location manager   Occupation: unemployed  Tobacco Use   Smoking status: Former    Current packs/day: 0.50    Average packs/day: 0.5 packs/day for 15.0 years (7.5 ttl pk-yrs)    Types: Cigarettes    Passive exposure: Current   Smokeless tobacco: Never  Vaping Use   Vaping status: Never Used  Substance and Sexual Activity   Alcohol use: Not Currently   Drug use: No   Sexual activity: Yes

## 2023-08-30 NOTE — Progress Notes (Unsigned)
 FOLLOW UP  Date of Service/Encounter:  08/30/23   Assessment:   Chronic rhinitis   Folliculitis   GERD - on Nexium    Previously on allergy shots when he lived in [place]     Complicated past medical history, including TTP treated with prednisone  and rituximab   Plan/Recommendations:   There are no Patient Instructions on file for this visit.   Subjective:   Eric Hooper is a 48 y.o. male presenting today for follow up of No chief complaint on file.   Eric Hooper has a history of the following: Patient Active Problem List   Diagnosis Date Noted   Headache 07/23/2022   Cervical disc disorder with radiculopathy of cervical region 06/20/2020   Seasonal allergic rhinitis 03/05/2020   Chronic left shoulder pain 11/27/2019   GERD (gastroesophageal reflux disease) 08/10/2019   Hypertension associated with diabetes (HCC) 08/10/2019   Insomnia 06/09/2019   PICC (peripherally inserted central catheter) in place 09/05/2018   Mixed diabetic hyperlipidemia associated with type 2 diabetes mellitus (HCC) 02/28/2018   Type 2 diabetes mellitus without complications (HCC) 02/25/2018   Tobacco use disorder 04/13/2017   T.T.P. syndrome (HCC) 02/06/2017   Thrombocytopenia (HCC) 02/01/2017    History obtained from: chart review and {Persons; PED relatives w/patient:19415::"patient"}.  Discussed the use of AI scribe software for clinical note transcription with the patient and/or guardian, who gave verbal consent to proceed.  Eric Hooper is a 48 y.o. male presenting for skin testing. He was last seen on April 28. We could not do testing because his insurance company does not cover testing on the same day as a New Patient visit. He has been off of all antihistamines 3 days in anticipation of the testing.   At that time, we started Singulair  to see if it would help at all.  For his folliculitis, we continue with clindamycin  lotion as needed.  He also continued on Nexium  daily.  He was  previously on cetirizine , but did not feel it was effective.  Otherwise, there have been no changes to his past medical history, surgical history, family history, or social history.    Review of systems otherwise negative other than that mentioned in the HPI.    Objective:   There were no vitals taken for this visit. There is no height or weight on file to calculate BMI.    Physical exam deferred since this was a skin testing appointment only.   Diagnostic studies: {Blank single:19197::"none","deferred due to recent antihistamine use","labs sent instead"," "}  Spirometry: {Blank single:19197::"results normal (FEV1: ***%, FVC: ***%, FEV1/FVC: ***%)","results abnormal (FEV1: ***%, FVC: ***%, FEV1/FVC: ***%)"}.    {Blank single:19197::"Spirometry consistent with mild obstructive disease","Spirometry consistent with moderate obstructive disease","Spirometry consistent with severe obstructive disease","Spirometry consistent with possible restrictive disease","Spirometry consistent with mixed obstructive and restrictive disease","Spirometry uninterpretable due to technique","Spirometry consistent with normal pattern"}. {Blank single:19197::"Albuterol /Atrovent nebulizer","Xopenex/Atrovent nebulizer","Albuterol  nebulizer","Albuterol  four puffs via MDI","Xopenex four puffs via MDI"} treatment given in clinic with {Blank single:19197::"significant improvement in FEV1 per ATS criteria","significant improvement in FVC per ATS criteria","significant improvement in FEV1 and FVC per ATS criteria","improvement in FEV1, but not significant per ATS criteria","improvement in FVC, but not significant per ATS criteria","improvement in FEV1 and FVC, but not significant per ATS criteria","no improvement"}.  Allergy Studies: {Blank single:19197::"none","labs sent instead"," "}    {Blank single:19197::"Allergy testing results were read and interpreted by myself, documented by clinical staff."," "}      Eric Gentles, MD  [facility] 

## 2023-08-30 NOTE — Patient Instructions (Addendum)
 1. Chronic rhinitis - Testing today showed: grasses, ragweed, weeds, and dog - Copy of test results provided.  - Avoidance measures provided. - Stop taking: current nose sprays  - Continue with: Singulair  (montelukast ) 10mg  daily - Start taking: Ryaltris (olopatadine/mometasone) two sprays per nostril 1-2 times daily as needed - You can use an extra dose of the antihistamine, if needed, for breakthrough symptoms.  - Consider nasal saline rinses 1-2 times daily to remove allergens from the nasal cavities as well as help with mucous clearance (this is especially helpful to do before the nasal sprays are given) - Strongly allergy shots as a means of long-term control. - Allergy shots "re-train" and "reset" the immune system to ignore environmental allergens and decrease the resulting immune response to those allergens (sneezing, itchy watery eyes, runny nose, nasal congestion, etc).    - Allergy shots improve symptoms in 75-85% of patients.  - We can discuss more at the next appointment if the medications are not working for you.  2. Folliculitis/acne  - Continue with the clindamycin  lotion.  3. GERD - Continue Nexium  daily.   4. Return in about 3 months (around 11/30/2023). You can have the follow up appointment with Dr. Idolina Maker or a Nurse Practicioner (our Nurse Practitioners are excellent and always have Physician oversight!).    Please inform us  of any Emergency Department visits, hospitalizations, or changes in symptoms. Call us  before going to the ED for breathing or allergy symptoms since we might be able to fit you in for a sick visit. Feel free to contact us  anytime with any questions, problems, or concerns.  It was a pleasure to meet you and your family today!  Websites that have reliable patient information: 1. American Academy of Asthma, Allergy, and Immunology: www.aaaai.org 2. Food Allergy Research and Education (FARE): foodallergy.org 3. Mothers of Asthmatics:  http://www.asthmacommunitynetwork.org 4. American College of Allergy, Asthma, and Immunology: www.acaai.org      "Like" us  on Facebook and Instagram for our latest updates!      A healthy democracy works best when [employer] participate! Make sure you are registered to vote! If you have moved or changed any of your contact information, you will need to get this updated before voting! Scan the QR codes below to learn more!       Airborne Adult Perc - 08/30/23 1446     Time Antigen Placed 1446    Allergen Manufacturer Floyd Hutchinson    Location Back    Number of Test 55    Panel 1 Select    1. Control-Buffer 50% Glycerol Negative    2. Control-Histamine 2+    3. Bahia Negative    4. French Southern Territories Negative    5. Johnson Negative    6. [place]  Blue 2+    7. Meadow Fescue 3+    8. Perennial Rye 3+    9. Timothy 3+    10. Ragweed Mix 3+    11. Cocklebur Negative    12. Plantain,  English Negative    13. Baccharis Negative    14. Dog Fennel Negative    15. Russian Thistle Negative    16. Lamb's Quarters Negative    17. Sheep Sorrell Negative    18. Rough Pigweed Negative    19. Marsh Elder, Rough Negative    20. Mugwort, Common Negative    21. Box, Elder Negative    22. Cedar, red Negative    23. Sweet Gum Negative    24. Pecan Pollen Negative  25. Pine Mix Negative    26. Walnut, Black Pollen Negative    27. Red Mulberry Negative    28. Ash Mix Negative    29. Birch Mix Negative    30. Beech American Negative    31. Cottonwood, Guinea-Bissau Negative    32. Hickory, White Negative    33. Maple Mix Negative    34. Oak, Guinea-Bissau Mix Negative    35. Sycamore Eastern Negative    36. Alternaria Alternata Negative    37. Cladosporium Herbarum Negative    38. Aspergillus Mix Negative    39. Penicillium Mix Negative    40. Bipolaris Sorokiniana (Helminthosporium) Negative    41. Drechslera Spicifera (Curvularia) Negative    42. Mucor Plumbeus Negative    43. Fusarium Moniliforme  Negative    44. Aureobasidium Pullulans (pullulara) Negative    45. Rhizopus Oryzae Negative    46. Botrytis Cinera Negative    47. Epicoccum Nigrum Negative    48. Phoma Betae Negative    49. Dust Mite Mix Negative    50. Cat Hair 10,000 BAU/ml Negative    51.  Dog Epithelia Negative    52. Mixed Feathers Negative    53. Horse Epithelia Negative    54. Cockroach, German Negative    55. Tobacco Leaf Negative             Intradermal - 08/30/23 1512     Time Antigen Placed 1515    Allergen Manufacturer Floyd Hutchinson    Location Arm    Number of Test 14    Control Negative    Bahia 2+    French Southern Territories Negative    Johnson Negative    Weed Mix 2+    Tree Mix Negative    Mold 1 Negative    Mold 2 Negative    Mold 3 Negative    Mold 4 Negative    Mite Mix Negative    Cat Negative    Dog 3+    Cockroach Negative             Reducing Pollen Exposure  The American Academy of Allergy, Asthma and Immunology suggests the following steps to reduce your exposure to pollen during allergy seasons.    Do not hang sheets or clothing out to dry; pollen may collect on these items. Do not mow lawns or spend time around freshly cut grass; mowing stirs up pollen. Keep windows closed at night.  Keep car windows closed while driving. Minimize morning activities outdoors, a time when pollen counts are usually at their highest. Stay indoors as much as possible when pollen counts or humidity is high and on windy days when pollen tends to remain in the air longer. Use air conditioning when possible.  Many air conditioners have filters that trap the pollen spores. Use a HEPA room air filter to remove pollen form the indoor air you breathe.  Control of Dog or Cat Allergen  Avoidance is the best way to manage a dog or cat allergy. If you have a dog or cat and are allergic to dog or cats, consider removing the dog or cat from the home. If you have a dog or cat but don't want to find it a new home, or if  your family wants a pet even though someone in the household is allergic, here are some strategies that may help keep symptoms at bay:  Keep the pet out of your bedroom and restrict it to only a few rooms. Be advised  that keeping the dog or cat in only one room will not limit the allergens to that room. Don't pet, hug or kiss the dog or cat; if you do, wash your hands with soap and water. High-efficiency particulate air (HEPA) cleaners run continuously in a bedroom or living room can reduce allergen levels over time. Regular use of a high-efficiency vacuum cleaner or a central vacuum can reduce allergen levels. Giving your dog or cat a bath at least once a week can reduce airborne allergen.  Allergy Shots  Allergies are the result of a chain reaction that starts in the immune system. Your immune system controls how your body defends itself. For instance, if you have an allergy to pollen, your immune system identifies pollen as an invader or allergen. Your immune system overreacts by producing antibodies called Immunoglobulin E (IgE). These antibodies travel to cells that release chemicals, causing an allergic reaction.  The concept behind allergy immunotherapy, whether it is received in the form of shots or tablets, is that the immune system can be desensitized to specific allergens that trigger allergy symptoms. Although it requires time and patience, the payback can be long-term relief. Allergy injections contain a dilute solution of those substances that you are allergic to based upon your skin testing and allergy history.   How Do Allergy Shots Work?  Allergy shots work much like a vaccine. Your body responds to injected amounts of a particular allergen given in increasing doses, eventually developing a resistance and tolerance to it. Allergy shots can lead to decreased, minimal or no allergy symptoms.  There generally are two phases: build-up and maintenance. Build-up often ranges from three  to six months and involves receiving injections with increasing amounts of the allergens. The shots are typically given once or twice a week, though more rapid build-up schedules are sometimes used.  The maintenance phase begins when the most effective dose is reached. This dose is different for each person, depending on how allergic you are and your response to the build-up injections. Once the maintenance dose is reached, there are longer periods between injections, typically two to four weeks.  Occasionally doctors give cortisone-type shots that can temporarily reduce allergy symptoms. These types of shots are different and should not be confused with allergy immunotherapy shots.  Who Can Be Treated with Allergy Shots?  Allergy shots may be a good treatment approach for people with allergic rhinitis (hay fever), allergic asthma, conjunctivitis (eye allergy) or stinging insect allergy.   Before deciding to begin allergy shots, you should consider:   The length of allergy season and the severity of your symptoms  Whether medications and/or changes to your environment can control your symptoms  Your desire to avoid long-term medication use  Time: allergy immunotherapy requires a major time commitment  Cost: may vary depending on your insurance coverage  Allergy shots for children age 64 and older are effective and often well tolerated. They might prevent the onset of new allergen sensitivities or the progression to asthma.  Allergy shots are not started on patients who are pregnant but can be continued on patients who become pregnant while receiving them. In some patients with other medical conditions or who take certain common medications, allergy shots may be of risk. It is important to mention other medications you talk to your allergist.   What are the two types of build-ups offered:   RUSH or Rapid Desensitization -- one day of injections lasting from 8:30-4:30pm, injections every 1  hour.  Approximately half of the build-up process is completed in that one day.  The following week, normal build-up is resumed, and this entails ~16 visits either weekly or twice weekly, until reaching your "maintenance dose" which is continued weekly until eventually getting spaced out to every month for a duration of 3 to 5 years. The regular build-up appointments are nurse visits where the injections are administered, followed by required monitoring for 30 minutes.    Traditional build-up -- weekly visits for 6 -12 months until reaching "maintenance dose", then continue weekly until eventually spacing out to every 4 weeks as above. At these appointments, the injections are administered, followed by required monitoring for 30 minutes.     Either way is acceptable, and both are equally effective. With the rush protocol, the advantage is that less time is spent here for injections overall AND you would also reach maintenance dosing faster (which is when the clinical benefit starts to become more apparent). Not everyone is a candidate for rapid desensitization.   IF we proceed with the RUSH protocol, there are premedications which must be taken the day before and the day after the rush only (this includes antihistamines, steroids, and Singulair ).  After the rush day, no prednisone  or Singulair  is required, and we just recommend antihistamines taken on your injection day.  What Is An Estimate of the Costs?  If you are interested in starting allergy injections, please check with your insurance company about your coverage for both allergy vial sets and allergy injections.  Please do so prior to making the appointment to start injections.  The following are CPT codes to give to your insurance company. These are the amounts we BILL to the insurance company, but the amount YOU WILL PAY and WE RECEIVE IS SUBSTANTIALLY LESS and depends on the contracts we have with different insurance companies.   Amount  Billed to Insurance One allergy vial set  CPT 95165   $ 1200     Two allergy vial set  CPT 95165   $ 2400     Three allergy vial set  CPT 95165   $ 3600     One injection   CPT 95115   $ 35  Two injections   CPT 95117   $ 40 RUSH (Rapid Desensitization) CPT 95180 x 8 hours $500/hour  Regarding the allergy injections, your co-pay may or may not apply with each injection, so please confirm this with your insurance company. When you start allergy injections, 1 or 2 sets of vials are made based on your allergies.  Not all patients can be on one set of vials. A set of vials lasts 6 months to a year depending on how quickly you can proceed with your build-up of your allergy injections. Vials are personalized for each patient depending on their specific allergens.  How often are allergy injection given during the build-up period?   Injections are given at least weekly during the build-up period until your maintenance dose is achieved. Per the doctor's discretion, you may have the option of getting allergy injections two times per week during the build-up period. However, there must be at least 48 hours between injections. The build-up period is usually completed within 6-12 months depending on your ability to schedule injections and for adjustments for reactions. When maintenance dose is reached, your injection schedule is gradually changed to every two weeks and later to every three weeks. Injections will then continue every 4 weeks. Usually, injections are continued for a  total of 3-5 years.   When Will I Feel Better?  Some may experience decreased allergy symptoms during the build-up phase. For others, it may take as long as 12 months on the maintenance dose. If there is no improvement after a year of maintenance, your allergist will discuss other treatment options with you.  If you aren't responding to allergy shots, it may be because there is not enough dose of the allergen in your vaccine or there  are missing allergens that were not identified during your allergy testing. Other reasons could be that there are high levels of the allergen in your environment or major exposure to non-allergic triggers like tobacco smoke.  What Is the Length of Treatment?  Once the maintenance dose is reached, allergy shots are generally continued for three to five years. The decision to stop should be discussed with your allergist at that time. Some people may experience a permanent reduction of allergy symptoms. Others may relapse and a longer course of allergy shots can be considered.  What Are the Possible Reactions?  The two types of adverse reactions that can occur with allergy shots are local and systemic. Common local reactions include very mild redness and swelling at the injection site, which can happen immediately or several hours after. Report a delayed reaction from your last injection. These include arm swelling or runny nose, watery eyes or cough that occurs within 12-24 hours after injection. A systemic reaction, which is less common, affects the entire body or a particular body system. They are usually mild and typically respond quickly to medications. Signs include increased allergy symptoms such as sneezing, a stuffy nose or hives.   Rarely, a serious systemic reaction called anaphylaxis can develop. Symptoms include swelling in the throat, wheezing, a feeling of tightness in the chest, nausea or dizziness. Most serious systemic reactions develop within 30 minutes of allergy shots. This is why it is strongly recommended you wait in your doctor's office for 30 minutes after your injections. Your allergist is trained to watch for reactions, and his or her staff is trained and equipped with the proper medications to identify and treat them.   Report to the nurse immediately if you experience any of the following symptoms: swelling, itching or redness of the skin, hives, watery eyes/nose, breathing  difficulty, excessive sneezing, coughing, stomach pain, diarrhea, or light headedness. These symptoms may occur within 15-20 minutes after injection and may require medication.   Who Should Administer Allergy Shots?  The preferred location for receiving shots is your prescribing allergist's office. Injections can sometimes be given at another facility where the physician and staff are trained to recognize and treat reactions, and have received instructions by your prescribing allergist.  What if I am late for an injection?   Injection dose will be adjusted depending upon how many days or weeks you are late for your injection.   What if I am sick?   Please report any illness to the nurse before receiving injections. She may adjust your dose or postpone injections depending on your symptoms. If you have fever, flu, sinus infection or chest congestion it is best to postpone allergy injections until you are better. Never get an allergy injection if your asthma is causing you problems. If your symptoms persist, seek out medical care to get your health problem under control.  What If I am or Become Pregnant:  Women that become pregnant should schedule an appointment with [facility] before receiving any  further allergy injections.

## 2023-08-31 ENCOUNTER — Encounter: Payer: Self-pay | Admitting: Allergy & Immunology

## 2023-08-31 MED ORDER — RYALTRIS 665-25 MCG/ACT NA SUSP: 2.0000 | Freq: Two times a day (BID) | NASAL | 5 refills | Status: DC | PRN

## 2023-09-01 ENCOUNTER — Telehealth: Payer: Self-pay | Admitting: Allergy & Immunology

## 2023-09-01 NOTE — Telephone Encounter (Signed)
 Ladale called in and states that [facility]  called him and stated the Ryaltris was $45 and they told him that we have to make sure we file it through his insurance.  Insurance wasn't included in the prescription.  He didn't quite understand what they were stating.

## 2023-09-03 ENCOUNTER — Telehealth: Payer: Self-pay

## 2023-09-03 ENCOUNTER — Encounter: Payer: Self-pay | Admitting: Student

## 2023-09-03 ENCOUNTER — Ambulatory Visit (INDEPENDENT_AMBULATORY_CARE_PROVIDER_SITE_OTHER): Admitting: Student

## 2023-09-03 VITALS — BP 132/78 | HR 74 | Ht 69.0 in | Wt 191.8 lb

## 2023-09-03 DIAGNOSIS — L989 Disorder of the skin and subcutaneous tissue, unspecified: Secondary | ICD-10-CM | POA: Insufficient documentation

## 2023-09-03 NOTE — Telephone Encounter (Signed)
*  Allergy /Asthma  Pharmacy Patient Advocate Encounter   Received notification from CoverMyMeds that prior authorization for Ryaltris  665-25MCG/ACT suspension  is required/requested.   Insurance verification completed.   The patient is insured through [facility] .   Per test claim: PA required; PA submitted to above mentioned insurance via CoverMyMeds Key/confirmation #/EOC [id number] Status is pending

## 2023-09-03 NOTE — Telephone Encounter (Signed)
 Approved. RYALTRIS  665/25/MCG/ACT Suspension is approved from 09/03/2023 to 09/02/2024. Effective Date: 09/03/2023 Authorization Expiration Date: 09/02/2024

## 2023-09-03 NOTE — Progress Notes (Signed)
    SUBJECTIVE:   CHIEF COMPLAINT / HPI:   48 year old male with history of type 2 diabetes presenting today for recent foot lesion. Eric Hooper is a 48 year old male with diabetes who presents with a lesion on his foot.  He noticed the lesion yesterday, which is accompanied by intense itching but no pain. He changes shoes daily and does not walk barefoot. He is concerned about his foot health due to his diabetes and his father's recent passing from complications related to a foot injury.  PERTINENT  PMH / PSH: Reviewed  OBJECTIVE:   BP 132/78   Pulse 74   Ht 5\' 9"  (1.753 m)   Wt 191 lb 12.8 oz (87 kg)   SpO2 99%   BMI 28.32 kg/m    Physical Exam General: Alert, well appearing, NAD Cardiovascular: Lower rate and rhythm, well-perfused Respiratory: Work of breathing on RA Left foot: flat hyperpigment well circumscribed lesion on the plantar surface of the fourth toe.   ASSESSMENT/PLAN:   Foot lesion New lesion on foot, likely bruise or benign pigmented lesion. Suspect possible Acral Nevus.  No pain or significant itching. No family history of skin cancer. - Monitor lesion for changes in size or appearance. - Educated on signs of concerning changes such as rapid growth or color change. - Follow-up in a month to assess for changes.    Eric Last, MD [facility]

## 2023-09-03 NOTE — Assessment & Plan Note (Signed)
 New lesion on foot, likely bruise or benign pigmented lesion. Suspect possible Acral Nevus.  No pain or significant itching. No family history of skin cancer. - Monitor lesion for changes in size or appearance. - Educated on signs of concerning changes such as rapid growth or color change. - Follow-up in a month to assess for changes.

## 2023-09-03 NOTE — Patient Instructions (Addendum)
 Pleasure to see you today.  Suspect the lesion on your foot is most likely an Acral Nevus which is benign lesion (Not cancerous)  We will watch it over time to see if it changes in size  Follow up in a month lets see if it changes at all

## 2023-09-07 ENCOUNTER — Encounter: Payer: Self-pay | Admitting: Orthopedic Surgery

## 2023-09-11 ENCOUNTER — Other Ambulatory Visit

## 2023-09-14 ENCOUNTER — Ambulatory Visit: Admitting: Family Medicine

## 2023-09-16 ENCOUNTER — Encounter: Payer: Self-pay | Admitting: Oncology

## 2023-09-16 ENCOUNTER — Inpatient Hospital Stay: Attending: Oncology | Admitting: Oncology

## 2023-09-16 ENCOUNTER — Inpatient Hospital Stay

## 2023-09-16 VITALS — BP 130/76 | HR 54 | Temp 97.9°F | Resp 18

## 2023-09-16 VITALS — BP 138/89 | HR 69 | Temp 98.2°F | Resp 18 | Ht 69.0 in | Wt 191.0 lb

## 2023-09-16 DIAGNOSIS — M3119 Other thrombotic microangiopathy: Secondary | ICD-10-CM

## 2023-09-16 DIAGNOSIS — Z5112 Encounter for antineoplastic immunotherapy: Secondary | ICD-10-CM | POA: Insufficient documentation

## 2023-09-16 LAB — CMP (CANCER CENTER ONLY)
ALT: 37 U/L (ref 0–44)
AST: 24 U/L (ref 15–41)
Albumin: 4.3 g/dL (ref 3.5–5.0)
Alkaline Phosphatase: 165 U/L — ABNORMAL HIGH (ref 38–126)
Anion gap: 11 (ref 5–15)
BUN: 9 mg/dL (ref 6–20)
CO2: 24 mmol/L (ref 22–32)
Calcium: 9.8 mg/dL (ref 8.9–10.3)
Chloride: 105 mmol/L (ref 98–111)
Creatinine: 0.91 mg/dL (ref 0.61–1.24)
GFR, Estimated: >60 mL/min (ref >60)
Glucose, Bld: 144 mg/dL — ABNORMAL HIGH (ref 70–99)
Potassium: 3.8 mmol/L (ref 3.5–5.1)
Sodium: 141 mmol/L (ref 135–145)
Total Bilirubin: 1.1 mg/dL (ref 0.0–1.2)
Total Protein: 6.7 g/dL (ref 6.5–8.1)

## 2023-09-16 LAB — CBC WITH DIFFERENTIAL (CANCER CENTER ONLY)
Abs Immature Granulocytes: 0.02 10*3/uL (ref 0.00–0.07)
Basophils Absolute: 0 10*3/uL (ref 0.0–0.1)
Basophils Relative: 0 %
Eosinophils Absolute: 0.2 10*3/uL (ref 0.0–0.5)
Eosinophils Relative: 3 %
HCT: 47.5 % (ref 39.0–52.0)
Hemoglobin: 16.6 g/dL (ref 13.0–17.0)
Immature Granulocytes: 0 %
Lymphocytes Relative: 40 %
Lymphs Abs: 3.3 10*3/uL (ref 0.7–4.0)
MCH: 31.7 pg (ref 26.0–34.0)
MCHC: 34.9 g/dL (ref 30.0–36.0)
MCV: 90.8 fL (ref 80.0–100.0)
Monocytes Absolute: 0.6 10*3/uL (ref 0.1–1.0)
Monocytes Relative: 7 %
Neutro Abs: 4.1 10*3/uL (ref 1.7–7.7)
Neutrophils Relative %: 50 %
Platelet Count: 173 10*3/uL (ref 150–400)
RBC: 5.23 MIL/uL (ref 4.22–5.81)
RDW: 13.2 % (ref 11.5–15.5)
WBC Count: 8.2 10*3/uL (ref 4.0–10.5)
nRBC: 0 % (ref 0.0–0.2)

## 2023-09-16 LAB — LACTATE DEHYDROGENASE: LDH: 141 U/L (ref 98–192)

## 2023-09-16 MED ORDER — SODIUM CHLORIDE 0.9 % IV SOLN
375.0000 mg/m2 | Freq: Once | INTRAVENOUS | Status: AC
  Administered 2023-09-16: 800 mg via INTRAVENOUS
  Filled 2023-09-16: qty 50

## 2023-09-16 MED ORDER — DIPHENHYDRAMINE HCL 25 MG PO CAPS
50.0000 mg | ORAL_CAPSULE | Freq: Once | ORAL | Status: AC
  Administered 2023-09-16: 50 mg via ORAL
  Filled 2023-09-16: qty 2

## 2023-09-16 MED ORDER — SODIUM CHLORIDE 0.9 % IV SOLN
INTRAVENOUS | Status: DC
Start: 2023-09-16 — End: 2023-09-16

## 2023-09-16 MED ORDER — ACETAMINOPHEN 325 MG PO TABS
650.0000 mg | ORAL_TABLET | Freq: Once | ORAL | Status: AC
  Administered 2023-09-16: 650 mg via ORAL
  Filled 2023-09-16: qty 2

## 2023-09-16 NOTE — Progress Notes (Signed)
 Patient seen by Dr. Thornton Papas today  Vitals are within treatment parameters:Yes   Labs are within treatment parameters: Yes   Treatment plan has been signed: Yes   Per physician team, Patient is ready for treatment and there are NO modifications to the treatment plan.

## 2023-09-16 NOTE — Progress Notes (Signed)
 [facility] OFFICE PROGRESS NOTE   Diagnosis: TTP  INTERVAL HISTORY:   Eric Hooper returns as scheduled.  He completed another treatment with rituximab  08/19/2023.  No symptom of allergic reaction.  No bleeding or fever. He has multiple complaints today including pain at the posterior neck, left shoulder, and left elbow for the past several weeks.  He has limited range of motion at the left shoulder.  He has developed recurrent anterior chest pain for the past several weeks.  The pain is constant.  He is being evaluated by gastroenterology for dysphagia and reflux symptoms.  He reports a procedure is scheduled for next week.  Objective:  Vital signs in last 24 hours:  Blood pressure 138/89, pulse 69, temperature 98.2 F (36.8 C), resp. rate 18, height 5\' 9"  (1.753 m), weight 191 lb (86.6 kg), SpO2 100%.    Resp: Lungs clear bilaterally, no respiratory distress Cardio: Regular rate and rhythm GI: No hepatosplenomegaly, nontender Vascular: No leg edema Musculoskeletal: Mild tenderness at the anterior chest/upper sternum, no mass, decreased range of motion with abduction at the left shoulder, tenderness at the left olecranon  Lab Results:  Lab Results  Component Value Date   WBC 8.2 09/16/2023   HGB 16.6 09/16/2023   HCT 47.5 09/16/2023   MCV 90.8 09/16/2023   PLT 173 09/16/2023   NEUTROABS 4.1 09/16/2023    CMP  Lab Results  Component Value Date   NA 141 09/16/2023   K 3.8 09/16/2023   CL 105 09/16/2023   CO2 24 09/16/2023   GLUCOSE 144 (H) 09/16/2023   BUN 9 09/16/2023   CREATININE 0.91 09/16/2023   CALCIUM  9.8 09/16/2023   PROT 6.7 09/16/2023   ALBUMIN 4.3 09/16/2023   AST 24 09/16/2023   ALT 37 09/16/2023   ALKPHOS 165 (H) 09/16/2023   BILITOT 1.1 09/16/2023   GFRNONAA >60 09/16/2023   GFRAA 125 06/14/2020   Medications: I have reviewed the patient's current medications.   Assessment/Plan: TTP initially diagnosed in October 2018 with  recurrence in April 2020 and July 2023. Initiation of daily plasma exchange and Solu-Medrol  02/02/2017 [name] Antibody- 72, activity less than 2% on initial presentation; [name] from 08/15/2018 <2.0. Last plasma exchange 02/06/2017 Prednisone  60 mg daily beginning 02/08/2017 Prednisone  40 mg daily beginning 02/17/2017 Prednisone  30 mg daily beginning 03/09/2017 Prednisone  20 mg daily beginning 03/23/2017 Prednisone  10 mg daily beginning 04/01/2017 Prednisone  5 mg daily for 5 days then 5 mg every other day for 5 doses then stop beginning 05/11/2017 Recurrent TTP 08/14/2018 Plasma exchange/steroids started on 08/15/2018.  Plasma exchange discontinued after treatment on 08/19/2019, discharged on prednisone  60mg  daily Prednisone  taper to 40 mg daily 08/22/2018 Weekly rituximab  for 4 doses beginning 08/25/2018 Plasma exchange resumed 08/26/2018, 08/27/2018, 08/28/2018, 08/29/2018, 08/31/2018, 09/02/2018 Prednisone  taper to 30 mg daily 09/01/2018 Cycle 3 weekly Rituxan  09/08/2018 Prednisone  taper to 20 mg daily 09/08/2018 Cycle 4 weekly Rituxan  09/15/2018 Prednisone  taper to 10 mg daily 09/20/2018 Prednisone  taper to 5 mg daily 09/27/2018 Prednisone  discontinued 10/07/2018 Admission with relapse of TTP 10/23/2021 Daily Plasma exchange initiated 11/23/2021 prednisone  8/1 Prednisone  tapered to 40 mg daily beginning 12/04/2021 Weekly Rituxan  for 4 doses beginning 12/04/2021 Discontinue plasmapheresis after appointment 12/05/2021 Admitted to [facility] 12/11/2021-plasmapheresis reinitiated, Cablivi started 12/12/2021, prednisone  dose currently 80 mg daily Every other day plasma exchange beginning 12/28/2021-completed 01/09/2022 Second weekly Rituxan  12/31/2021 Prednisone  taper to 60 mg daily beginning 12/31/2021 Third weekly Rituxan  01/06/2022 Prednisone  taper to 40 mg daily beginning 01/07/2022 Fourth weekly rituximab  01/13/2022  Prednisone  taper to 30 mg daily 01/13/2022 Prednisone  taper to 20 mg daily 01/20/2022 Prednisone  taper to  15 mg daily 02/04/2022 Per patient report 02/19/2022 he has continued prednisone  30 mg daily and did not make the above adjustments, he will decrease prednisone  to 20 mg daily today Prednisone  taper to 10 mg daily 03/09/2022 Prednisone  taper to 5 mg daily 03/26/2022 Prednisone  taper to 5 mg every other day for 5 doses beginning 04/16/2022 ADAMTS 13 :14.2 on 04/02/2023, 7.2 on 04/08/2023 04/22/2023-rituximab  [name]: 54.6 on 05/17/2023 [name] 06/18/2023-8.9%, antibody-2 06/24/2023-rituximab  [name] 64 on 07/22/2023 07/22/2023-rituximab  08/19/2023-rituximab  08/19/2023: [name]-89.8% 09/16/2023-rituximab  2. Xiphoid pain-etiology unclear 3. Hypertension 4. Diabetes 5. Alcohol/Tobacco use 6. History of renal insufficiency 7. Right IJ dialysis catheter placed 02/02/2017, removed New right IJ dialysis catheter 08/15/2018 Catheter removed 09/12/2018 New right IJ dialysis catheter placed 11/23/2021 Temporary hemodialysis catheter converted to tunneled hemodialysis catheter 11/28/2021      Disposition: Eric Hooper appears unchanged.  He is tolerating the rituximab  well.  He is stable from a hematologic standpoint and the [name] was normal last month.  He will complete another treatment with rituximab  today.  He will return for an office visit and rituximab  in 1 month.  We will change to every 3 monthly rituximab  beginning in June if the [name] mains stable.  He will continue follow-up with his primary provider and gastroenterology to evaluate the chest and GI symptoms.  Coni Deep, MD  09/16/2023  10:26 AM

## 2023-09-19 LAB — ADAMTS13 ACTIVITY REFLEX

## 2023-09-19 LAB — ADAMTS13 ACTIVITY: Adamts 13 Activity: 86.9 % (ref >66.8)

## 2023-09-22 ENCOUNTER — Ambulatory Visit (HOSPITAL_COMMUNITY)
Admission: RE | Admit: 2023-09-22 | Discharge: 2023-09-22 | Disposition: A | Discharge status: Home / Self Care | Payer: Medicaid Other | Source: Ambulatory Visit | Attending: Gastroenterology | Admitting: Gastroenterology

## 2023-09-22 ENCOUNTER — Encounter (HOSPITAL_COMMUNITY)
Admission: RE | Disposition: A | Discharge status: Home / Self Care | Payer: Self-pay | Source: Ambulatory Visit | Attending: Gastroenterology

## 2023-09-22 DIAGNOSIS — R0789 Other chest pain: Secondary | ICD-10-CM | POA: Diagnosis not present

## 2023-09-22 DIAGNOSIS — R12 Heartburn: Secondary | ICD-10-CM | POA: Diagnosis not present

## 2023-09-22 HISTORY — PX: PH IMPEDANCE STUDY: SHX5565

## 2023-09-22 HISTORY — PX: ESOPHAGEAL MANOMETRY: SHX5429

## 2023-09-22 SURGERY — MANOMETRY, ESOPHAGUS

## 2023-09-22 MED ORDER — LIDOCAINE VISCOUS HCL 2 % MT SOLN
OROMUCOSAL | Status: AC
  Filled 2023-09-22: qty 15

## 2023-09-22 SURGICAL SUPPLY — 2 items
FACESHIELD LNG OPTICON STERILE (SAFETY) IMPLANT
GLOVE BIO SURGEON STRL SZ8 (GLOVE) ×2 IMPLANT

## 2023-09-22 NOTE — Progress Notes (Signed)
 Esophageal Manometry done per protocol. Pt tolerated well with out complication. Ph with impedance done per protocol. Pt tolerated well. Instructions given regarding the study and monitor. Pt verbalized understand and return demonstrated use of monitor. Pt will return tomorrow to have probe removed and monitor downloaded.

## 2023-09-23 ENCOUNTER — Ambulatory Visit: Admitting: Family Medicine

## 2023-09-23 ENCOUNTER — Encounter (HOSPITAL_COMMUNITY): Payer: Self-pay | Admitting: Gastroenterology

## 2023-09-28 ENCOUNTER — Telehealth: Payer: Self-pay | Admitting: *Deleted

## 2023-09-28 NOTE — Telephone Encounter (Signed)
 Stpehen called to ask if OK to have his broken tooth pulled now?  5/22 ADAMS13=86.9 and platelets 173,000. Next lab is 6/19. Informed him that MD is out of office till 6/09-will discuss w/him at that time

## 2023-10-01 ENCOUNTER — Encounter: Payer: Self-pay | Admitting: Family Medicine

## 2023-10-01 ENCOUNTER — Ambulatory Visit: Admitting: Family Medicine

## 2023-10-01 VITALS — BP 132/84 | HR 80 | Wt 190.2 lb

## 2023-10-01 DIAGNOSIS — I152 Hypertension secondary to endocrine disorders: Secondary | ICD-10-CM

## 2023-10-01 DIAGNOSIS — E1159 Type 2 diabetes mellitus with other circulatory complications: Secondary | ICD-10-CM | POA: Diagnosis not present

## 2023-10-01 DIAGNOSIS — R5383 Other fatigue: Secondary | ICD-10-CM | POA: Diagnosis not present

## 2023-10-01 NOTE — Assessment & Plan Note (Signed)
 Control improved today.  Ideal goal <130/80, nearly at goal. - Continue losartan  100 mg, amlodipine  10 mg, eplerenone  25 mg, Jardiance  10 mg - Follow up in 6 months

## 2023-10-01 NOTE — Patient Instructions (Signed)
 It was great to see you today! Thank you for choosing [facility] for your primary care.  Today we addressed: Fatigue, unspecified type Check your blood sugar when you feel tired around 6 pm.  Drink some juice or get a sugary snack if <70.  Call us  back if you are seeing low numbers as well.  I am checking your thyroid levels as well.  You can take a fiber pill for digestion.  Blood pressure Your blood pressure looks great today!  We will leave your medicines as is right now.  We are checking some labs today, including TSH.  You will get a MyChart message or a letter if results are normal. Otherwise, you will get a call from us .  You should return to our clinic in 3-6 months for a checkup.  Thank you for coming to see us  at [facility] and for the opportunity to care for you! Lansing Planas Wilgus Deyton, MD 10/01/2023, 2:37 PM

## 2023-10-01 NOTE — Progress Notes (Signed)
   SUBJECTIVE:   CHIEF COMPLAINT / HPI:  Eric Hooper is a 48 y.o. male with a pertinent past medical history of T2DM, HTN, TTP, GERD, and seasonal allergies presenting to the clinic for HTN follow up.  Hypertension BP: 132/84 today. Home medications include: losartan  100 mg, amlodipine  10 mg, eplerenone  25 mg, Jardiance  10 mg He endorses taking these medications as prescribed. Does not check blood pressure at home Denies hypotension symptoms including dizziness and orthostatic symptoms. Denies headache, vision changes, LUQ pain, hematuria, chest pain.  Fatigue Has been feeling more fatigued in the PM. Legs feel more heavily. Takes losartan  and feels better. No recent major weight changes. Denies dyspnea with exertion. Not dizzy, no recent falls or tripping. Reportedly drinks lots of water frequently. Does check CBGs at home and has been in 80s a few times recently, associates this with feeling poorly. No recent weight changes (up or down). No fevers. No  dyspnea with exertion, no orthopnea, no LE edema.   PERTINENT PMH / PSH: T2DM, HTN, TTP, GERD, and seasonal allergies   OBJECTIVE:   BP 132/84   Pulse 80   Wt 190 lb 3.2 oz (86.3 kg)   SpO2 98%   BMI 28.09 kg/m   General: Age-appropriate, resting comfortably in chair, NAD, alert and at baseline. HEENT: Smooth thyroid. Mild congestion. Cardiovascular: Regular rate and rhythm. Normal S1/S2. No murmurs, rubs, or gallops appreciated. 2+ radial pulses. Pulmonary: Clear bilaterally to ascultation. No wheezes, crackles, or rhonchi. Normal WOB on room air. No accessory muscle use. Skin: Warm and dry. Extremities: No peripheral edema bilaterally. Capillary refill <2 seconds.   ASSESSMENT/PLAN:   Assessment & Plan Hypertension associated with diabetes (HCC) Control improved today.  Ideal goal <130/80, nearly at goal. - Continue losartan  100 mg, amlodipine  10 mg, eplerenone  25 mg, Jardiance  10 mg - Follow up in 6  months Fatigue, unspecified type Overall negative ROS. Possible hypoglycemia given some lower reported CBGs recently. In medication review, note comment 07/29/2023 that patient's CBGs ran too low when taking 25 mg Jardiance , so was decreased to 10 mg. - TSH w/ reflex on abnormal - Check CBGs when feeling symptomatic, drink juice or take candy if <70 - Call back clinic if hypoglycemia noted, would likely discontinue Jardiance  in this case  Return in about 6 months (around 04/01/2024) for follow up.  Eric Voller Lansing Planas, MD [facility]

## 2023-10-02 LAB — TSH RFX ON ABNORMAL TO FREE T4: TSH: 0.54 u[IU]/mL (ref 0.450–4.500)

## 2023-10-03 ENCOUNTER — Ambulatory Visit: Payer: Self-pay | Admitting: Family Medicine

## 2023-10-04 ENCOUNTER — Other Ambulatory Visit: Payer: Self-pay | Admitting: Physician Assistant

## 2023-10-05 ENCOUNTER — Telehealth: Payer: Self-pay

## 2023-10-05 NOTE — Telephone Encounter (Signed)
 I called the patient to inform him it is ok to proceed to get his broken tooth pulled

## 2023-10-08 ENCOUNTER — Telehealth: Payer: Self-pay | Admitting: Gastroenterology

## 2023-10-08 NOTE — Telephone Encounter (Signed)
 Esophageal manometry done 09/22/23. Called the patient and advised his results are not in yet. We hope to have them next week.

## 2023-10-08 NOTE — Telephone Encounter (Signed)
 Patient called and stated that he has been waiting for a while to find out his results from his procedure that was done at the [facility]. Patient is requesting a call back. Please advise.

## 2023-10-14 ENCOUNTER — Ambulatory Visit: Admitting: Dietician

## 2023-10-14 ENCOUNTER — Inpatient Hospital Stay: Attending: Oncology

## 2023-10-14 ENCOUNTER — Encounter: Payer: Self-pay | Admitting: Nurse Practitioner

## 2023-10-14 ENCOUNTER — Inpatient Hospital Stay (HOSPITAL_BASED_OUTPATIENT_CLINIC_OR_DEPARTMENT_OTHER): Admitting: Nurse Practitioner

## 2023-10-14 ENCOUNTER — Inpatient Hospital Stay

## 2023-10-14 VITALS — BP 113/81 | HR 56 | Temp 98.0°F | Resp 18

## 2023-10-14 VITALS — BP 132/89 | HR 76 | Temp 98.1°F | Resp 18 | Ht 69.0 in | Wt 188.9 lb

## 2023-10-14 DIAGNOSIS — Z5112 Encounter for antineoplastic immunotherapy: Secondary | ICD-10-CM | POA: Diagnosis present

## 2023-10-14 DIAGNOSIS — M3119 Other thrombotic microangiopathy: Secondary | ICD-10-CM

## 2023-10-14 LAB — CMP (CANCER CENTER ONLY)
ALT: 40 U/L (ref 0–44)
AST: 25 U/L (ref 15–41)
Albumin: 4.6 g/dL (ref 3.5–5.0)
Alkaline Phosphatase: 172 U/L — ABNORMAL HIGH (ref 38–126)
Anion gap: 13 (ref 5–15)
BUN: 13 mg/dL (ref 6–20)
CO2: 25 mmol/L (ref 22–32)
Calcium: 10.2 mg/dL (ref 8.9–10.3)
Chloride: 104 mmol/L (ref 98–111)
Creatinine: 0.88 mg/dL (ref 0.61–1.24)
GFR, Estimated: >60 mL/min (ref >60)
Glucose, Bld: 100 mg/dL — ABNORMAL HIGH (ref 70–99)
Potassium: 3.5 mmol/L (ref 3.5–5.1)
Sodium: 142 mmol/L (ref 135–145)
Total Bilirubin: 1.2 mg/dL (ref 0.0–1.2)
Total Protein: 7.2 g/dL (ref 6.5–8.1)

## 2023-10-14 LAB — CBC WITH DIFFERENTIAL (CANCER CENTER ONLY)
Abs Immature Granulocytes: 0.01 10*3/uL (ref 0.00–0.07)
Basophils Absolute: 0 10*3/uL (ref 0.0–0.1)
Basophils Relative: 1 %
Eosinophils Absolute: 0.3 10*3/uL (ref 0.0–0.5)
Eosinophils Relative: 4 %
HCT: 47.6 % (ref 39.0–52.0)
Hemoglobin: 16.9 g/dL (ref 13.0–17.0)
Immature Granulocytes: 0 %
Lymphocytes Relative: 41 %
Lymphs Abs: 3 10*3/uL (ref 0.7–4.0)
MCH: 31.5 pg (ref 26.0–34.0)
MCHC: 35.5 g/dL (ref 30.0–36.0)
MCV: 88.6 fL (ref 80.0–100.0)
Monocytes Absolute: 0.6 10*3/uL (ref 0.1–1.0)
Monocytes Relative: 8 %
Neutro Abs: 3.5 10*3/uL (ref 1.7–7.7)
Neutrophils Relative %: 46 %
Platelet Count: 171 10*3/uL (ref 150–400)
RBC: 5.37 MIL/uL (ref 4.22–5.81)
RDW: 13 % (ref 11.5–15.5)
WBC Count: 7.5 10*3/uL (ref 4.0–10.5)
nRBC: 0 % (ref 0.0–0.2)

## 2023-10-14 LAB — LACTATE DEHYDROGENASE: LDH: 164 U/L (ref 98–192)

## 2023-10-14 MED ORDER — DIPHENHYDRAMINE HCL 25 MG PO CAPS
50.0000 mg | ORAL_CAPSULE | Freq: Once | ORAL | Status: AC
  Filled 2023-10-14: qty 2

## 2023-10-14 MED ORDER — ACETAMINOPHEN 325 MG PO TABS
650.0000 mg | ORAL_TABLET | Freq: Once | ORAL | Status: AC
  Filled 2023-10-14: qty 2

## 2023-10-14 MED ORDER — SODIUM CHLORIDE 0.9 % IV SOLN: INTRAVENOUS | Status: DC

## 2023-10-14 MED ORDER — SODIUM CHLORIDE 0.9 % IV SOLN
375.0000 mg/m2 | Freq: Once | INTRAVENOUS | Status: AC
  Filled 2023-10-14: qty 80
  Filled 2023-10-14: qty 50

## 2023-10-14 NOTE — Progress Notes (Signed)
 [facility] OFFICE PROGRESS NOTE   Diagnosis: TTP  INTERVAL HISTORY:   Eric Hooper returns as scheduled.  He completed another cycle of rituximab  09/16/2023.  He denies symptoms of an allergic reaction.  No bleeding.  No fever.  He continues to have left shoulder pain.  He reports this is a chronic issue.  He has been referred for MRI.  Objective:  Vital signs in last 24 hours:  Blood pressure 132/89, pulse 76, temperature 98.1 F (36.7 C), temperature source Temporal, resp. rate 18, height 5' 9 (1.753 m), weight 188 lb 14.4 oz (85.7 kg), SpO2 100%.    Resp: Lungs clear bilaterally. Cardio: Regular rate and rhythm. GI: No hepatosplenomegaly. Vascular: No leg edema. Neuro: Alert and oriented.   Lab Results:  Lab Results  Component Value Date   WBC 7.5 10/14/2023   HGB 16.9 10/14/2023   HCT 47.6 10/14/2023   MCV 88.6 10/14/2023   PLT 171 10/14/2023   NEUTROABS 3.5 10/14/2023    Imaging:  No results found.  Medications: I have reviewed the patient's current medications.  Assessment/Plan: TTP initially diagnosed in October 2018 with recurrence in April 2020 and July 2023. Initiation of daily plasma exchange and Solu-Medrol  02/02/2017 [name] Antibody- 72, activity less than 2% on initial presentation; [name] from 08/15/2018 <2.0. Last plasma exchange 02/06/2017 Prednisone  60 mg daily beginning 02/08/2017 Prednisone  40 mg daily beginning 02/17/2017 Prednisone  30 mg daily beginning 03/09/2017 Prednisone  20 mg daily beginning 03/23/2017 Prednisone  10 mg daily beginning 04/01/2017 Prednisone  5 mg daily for 5 days then 5 mg every other day for 5 doses then stop beginning 05/11/2017 Recurrent TTP 08/14/2018 Plasma exchange/steroids started on 08/15/2018.  Plasma exchange discontinued after treatment on 08/19/2019, discharged on prednisone  60mg  daily Prednisone  taper to 40 mg daily 08/22/2018 Weekly rituximab  for 4 doses beginning 08/25/2018 Plasma exchange  resumed 08/26/2018, 08/27/2018, 08/28/2018, 08/29/2018, 08/31/2018, 09/02/2018 Prednisone  taper to 30 mg daily 09/01/2018 Cycle 3 weekly Rituxan  09/08/2018 Prednisone  taper to 20 mg daily 09/08/2018 Cycle 4 weekly Rituxan  09/15/2018 Prednisone  taper to 10 mg daily 09/20/2018 Prednisone  taper to 5 mg daily 09/27/2018 Prednisone  discontinued 10/07/2018 Admission with relapse of TTP 10/23/2021 Daily Plasma exchange initiated 11/23/2021 prednisone  8/1 Prednisone  tapered to 40 mg daily beginning 12/04/2021 Weekly Rituxan  for 4 doses beginning 12/04/2021 Discontinue plasmapheresis after appointment 12/05/2021 Admitted to [facility] 12/11/2021-plasmapheresis reinitiated, Cablivi started 12/12/2021, prednisone  dose currently 80 mg daily Every other day plasma exchange beginning 12/28/2021-completed 01/09/2022 Second weekly Rituxan  12/31/2021 Prednisone  taper to 60 mg daily beginning 12/31/2021 Third weekly Rituxan  01/06/2022 Prednisone  taper to 40 mg daily beginning 01/07/2022 Fourth weekly rituximab  01/13/2022 Prednisone  taper to 30 mg daily 01/13/2022 Prednisone  taper to 20 mg daily 01/20/2022 Prednisone  taper to 15 mg daily 02/04/2022 Per patient report 02/19/2022 he has continued prednisone  30 mg daily and did not make the above adjustments, he will decrease prednisone  to 20 mg daily today Prednisone  taper to 10 mg daily 03/09/2022 Prednisone  taper to 5 mg daily 03/26/2022 Prednisone  taper to 5 mg every other day for 5 doses beginning 04/16/2022 ADAMTS 13 :14.2 on 04/02/2023, 7.2 on 04/08/2023 04/22/2023-rituximab  [name]: 54.6 on 05/17/2023 [name] 06/18/2023-8.9%, antibody-2 06/24/2023-rituximab  [name] 64 on 07/22/2023 07/22/2023-rituximab  08/19/2023-rituximab  08/19/2023: [name]-89.8% 09/16/2023-rituximab  09/16/2023 [name]-86.9% 10/14/2023-rituximab  2. Xiphoid pain-etiology unclear 3. Hypertension 4. Diabetes 5. Alcohol/Tobacco use 6. History of renal insufficiency 7. Right IJ dialysis catheter placed 02/02/2017,  removed New right IJ dialysis catheter 08/15/2018 Catheter removed 09/12/2018 New right IJ dialysis catheter placed 11/23/2021 Temporary hemodialysis catheter converted to tunneled hemodialysis  catheter 11/28/2021    Disposition: Eric Hooper appears stable.  He continues to tolerate rituximab  well.  He remains stable from a hematologic standpoint.  [name] in normal range last month.  Plan to proceed with rituximab  today as scheduled.  Plan to change to every 3 months rituximab  going forward.  He agrees with this plan.  CBC and chemistry panel reviewed.  Labs adequate for treatment.  He will return for lab, follow-up, rituximab  in 12 weeks.  We are available to see him sooner if needed.    Diana Forster ANP/GNP-BC   10/14/2023  9:45 AM

## 2023-10-14 NOTE — Patient Instructions (Signed)
 [facility] - A DEPT OF [facility]   Discharge Instructions: Thank you for choosing [facility] to provide your oncology and hematology care.   If you have a lab appointment with the Cancer Center, please go directly to the Cancer Center and check in at the registration area.   Wear comfortable clothing and clothing appropriate for easy access to any Portacath or PICC line.   We strive to give you quality time with your provider. You may need to reschedule your appointment if you arrive late (15 or more minutes).  Arriving late affects you and other patients whose appointments are after yours.  Also, if you miss three or more appointments without notifying the office, you may be dismissed from the clinic at the provider's discretion.      For prescription refill requests, have your pharmacy contact our office and allow 72 hours for refills to be completed.    Today you received the following chemotherapy and/or immunotherapy agents RUXIENCE        To help prevent nausea and vomiting after your treatment, we encourage you to take your nausea medication as directed.  BELOW ARE SYMPTOMS THAT SHOULD BE REPORTED IMMEDIATELY: *FEVER GREATER THAN 100.4 F (38 C) OR HIGHER *CHILLS OR SWEATING *NAUSEA AND VOMITING THAT IS NOT CONTROLLED WITH YOUR NAUSEA MEDICATION *UNUSUAL SHORTNESS OF BREATH *UNUSUAL BRUISING OR BLEEDING *URINARY PROBLEMS (pain or burning when urinating, or frequent urination) *BOWEL PROBLEMS (unusual diarrhea, constipation, pain near the anus) TENDERNESS IN MOUTH AND THROAT WITH OR WITHOUT PRESENCE OF ULCERS (sore throat, sores in mouth, or a toothache) UNUSUAL RASH, SWELLING OR PAIN  UNUSUAL VAGINAL DISCHARGE OR ITCHING   Items with * indicate a potential emergency and should be followed up as soon as possible or go to the Emergency Department if any problems should occur.  Please show the CHEMOTHERAPY ALERT CARD or  IMMUNOTHERAPY ALERT CARD at check-in to the Emergency Department and triage nurse.  Should you have questions after your visit or need to cancel or reschedule your appointment, please contact [facility] - A DEPT OF [facility]  Dept: [phone]  and follow the prompts.  Office hours are 8:00 a.m. to 4:30 p.m. Monday - Friday. Please note that voicemails left after 4:00 p.m. may not be returned until the following business day.  We are closed weekends and major holidays. You have access to a nurse at all times for urgent questions. Please call the main number to the clinic Dept: [phone] and follow the prompts.   For any non-urgent questions, you may also contact your provider using MyChart. We now offer e-Visits for anyone 91 and older to request care online for non-urgent symptoms. For details visit [email or web address].   Also download the MyChart app! Go to the app store, search MyChart, open the app, select [place], and log in with your MyChart username and password.  Rituximab  Injection What is this medication? RITUXIMAB  (ri TUX i mab) treats leukemia and lymphoma. It works by blocking a protein that causes cancer cells to grow and multiply. This helps to slow or stop the spread of cancer cells. It may also be used to treat autoimmune conditions, such as arthritis. It works by slowing down an overactive immune system. It is a monoclonal antibody. This medicine may be used for other purposes; ask your health care provider or pharmacist if you have questions. COMMON BRAND NAME(S): RIABNI , Rituxan , RUXIENCE , truxima  What should  I tell my care team before I take this medication? They need to know if you have any of these conditions: Chest pain Heart disease Immune system problems Infection, such as chickenpox, cold sores, hepatitis B, herpes Irregular heartbeat or rhythm Kidney disease Low blood counts, such as low white cells, platelets, red  cells Lung disease Recent or upcoming vaccine An unusual or allergic reaction to rituximab , other medications, foods, dyes, or preservatives Pregnant or trying to get pregnant Breast-feeding How should I use this medication? This medication is injected into a vein. It is given by a care team in a hospital or clinic setting. A special MedGuide will be given to you before each treatment. Be sure to read this information carefully each time. Talk to your care team about the use of this medication in children. While this medication may be prescribed for children as young as 6 months for selected conditions, precautions do apply. Overdosage: If you think you have taken too much of this medicine contact a poison control center or emergency room at once. NOTE: This medicine is only for you. Do not share this medicine with others. What if I miss a dose? Keep appointments for follow-up doses. It is important not to miss your dose. Call your care team if you are unable to keep an appointment. What may interact with this medication? Do not take this medication with any of the following: Live vaccines This medication may also interact with the following: Cisplatin This list may not describe all possible interactions. Give your health care provider a list of all the medicines, herbs, non-prescription drugs, or dietary supplements you use. Also tell them if you smoke, drink alcohol, or use illegal drugs. Some items may interact with your medicine. What should I watch for while using this medication? Your condition will be monitored carefully while you are receiving this medication. You may need blood work while taking this medication. This medication can cause serious infusion reactions. To reduce the risk your care team may give you other medications to take before receiving this one. Be sure to follow the directions from your care team. This medication may increase your risk of getting an infection. Call  your care team for advice if you get a fever, chills, sore throat, or other symptoms of a cold or flu. Do not treat yourself. Try to avoid being around people who are sick. Call your care team if you are around anyone with measles, chickenpox, or if you develop sores or blisters that do not heal properly. Avoid taking medications that contain aspirin , acetaminophen , ibuprofen, naproxen, or ketoprofen unless instructed by your care team. These medications may hide a fever. This medication may cause serious skin reactions. They can happen weeks to months after starting the medication. Contact your care team right away if you notice fevers or flu-like symptoms with a rash. The rash may be red or purple and then turn into blisters or peeling of the skin. You may also notice a red rash with swelling of the face, lips, or lymph nodes in your neck or under your arms. In some patients, this medication may cause a serious brain infection that may cause death. If you have any problems seeing, thinking, speaking, walking, or standing, tell your care team right away. If you cannot reach your care team, urgently seek another source of medical care. Talk to your care team if you may be pregnant. Serious birth defects can occur if you take this medication during pregnancy and for  12 months after the last dose. You will need a negative pregnancy test before starting this medication. Contraception is recommended while taking this medication and for 12 months after the last dose. Your care team can help you find the option that works for you. Do not breastfeed while taking this medication and for at least 6 months after the last dose. What side effects may I notice from receiving this medication? Side effects that you should report to your care team as soon as possible: Allergic reactions or angioedema--skin rash, itching or hives, swelling of the face, eyes, lips, tongue, arms, or legs, trouble swallowing or  breathing Bowel blockage--stomach cramping, unable to have a bowel movement or pass gas, loss of appetite, vomiting Dizziness, loss of balance or coordination, confusion or trouble speaking Heart attack--pain or tightness in the chest, shoulders, arms, or jaw, nausea, shortness of breath, cold or clammy skin, feeling faint or lightheaded Heart rhythm changes--fast or irregular heartbeat, dizziness, feeling faint or lightheaded, chest pain, trouble breathing Infection--fever, chills, cough, sore throat, wounds that don't heal, pain or trouble when passing urine, general feeling of discomfort or being unwell Infusion reactions--chest pain, shortness of breath or trouble breathing, feeling faint or lightheaded Kidney injury--decrease in the amount of urine, swelling of the ankles, hands, or feet Liver injury--right upper belly pain, loss of appetite, nausea, light-colored stool, dark yellow or brown urine, yellowing skin or eyes, unusual weakness or fatigue Redness, blistering, peeling, or loosening of the skin, including inside the mouth Stomach pain that is severe, does not go away, or gets worse Tumor lysis syndrome (TLS)--nausea, vomiting, diarrhea, decrease in the amount of urine, dark urine, unusual weakness or fatigue, confusion, muscle pain or cramps, fast or irregular heartbeat, joint pain Side effects that usually do not require medical attention (report to your care team if they continue or are bothersome): Headache Joint pain Nausea Runny or stuffy nose Unusual weakness or fatigue This list may not describe all possible side effects. Call your doctor for medical advice about side effects. You may report side effects to FDA at 1-800-FDA-1088. Where should I keep my medication? This medication is given in a hospital or clinic. It will not be stored at home. NOTE: This sheet is a summary. It may not cover all possible information. If you have questions about this medicine, talk to your  doctor, pharmacist, or health care provider.  2024 Elsevier/Gold Standard (2021-09-04 00:00:00)

## 2023-10-14 NOTE — Progress Notes (Signed)
 Patient seen by Diana Forster NP today  Vitals are within treatment parameters:Yes  Decreased 2 pounds Labs are within treatment parameters: N/A   Treatment plan has been signed: Yes   Per physician team, Patient is ready for treatment and there are NO modifications to the treatment plan.

## 2023-10-15 ENCOUNTER — Other Ambulatory Visit: Payer: Self-pay

## 2023-10-17 LAB — ADAMTS13 ACTIVITY REFLEX

## 2023-10-17 LAB — ADAMTS13 ACTIVITY: Adamts 13 Activity: 91.4 % (ref >66.8)

## 2023-10-20 NOTE — Telephone Encounter (Signed)
 Inbound call from patient requesting to know if results are back yet. Please advise, thank you

## 2023-10-21 NOTE — Telephone Encounter (Signed)
 Please inform patient that esophageal manometry was overall unremarkable, no significant abnormality.  Full report will be scanned into epic.  Thank you

## 2023-10-21 NOTE — Telephone Encounter (Signed)
 Patient advised. He understands recommendations will come from Dr Legrand once he has reviewed the report.

## 2023-10-29 ENCOUNTER — Other Ambulatory Visit: Payer: Self-pay | Admitting: Family Medicine

## 2023-10-29 DIAGNOSIS — I10 Essential (primary) hypertension: Secondary | ICD-10-CM

## 2023-11-01 ENCOUNTER — Ambulatory Visit: Admitting: Family Medicine

## 2023-11-01 VITALS — BP 136/88 | HR 72 | Ht 70.0 in | Wt 187.0 lb

## 2023-11-01 DIAGNOSIS — M545 Low back pain, unspecified: Secondary | ICD-10-CM | POA: Diagnosis not present

## 2023-11-01 DIAGNOSIS — M25512 Pain in left shoulder: Secondary | ICD-10-CM | POA: Diagnosis not present

## 2023-11-01 DIAGNOSIS — K219 Gastro-esophageal reflux disease without esophagitis: Secondary | ICD-10-CM

## 2023-11-01 DIAGNOSIS — G8929 Other chronic pain: Secondary | ICD-10-CM | POA: Diagnosis not present

## 2023-11-01 MED ORDER — FAMOTIDINE 20 MG PO TABS
20.0000 mg | ORAL_TABLET | Freq: Every day | ORAL | 1 refills | Status: DC
Start: 2023-11-01 — End: 2024-01-06

## 2023-11-01 NOTE — Patient Instructions (Addendum)
 It was great to see you today! Thank you for choosing [facility] for your primary care.  Today we addressed: GERD Please start taking your Pepcid  daily. Come back in 4 weeks if symptoms are not improving.  Back pain Please take acetaminophen  (Tylenol ) 500 mg up to 4 hours per day, once every 6 hour.  Orthopedic Surgery [facility]  [address]  [place]  [phone]  You should return to our clinic in December.  Thank you for coming to see us  at [facility] and for the opportunity to care for you! Toma, Daquawn Seelman, MD 11/01/2023, 3:26 PM

## 2023-11-01 NOTE — Assessment & Plan Note (Signed)
 Favor GERD given no chest pain, postprandial association of symptoms, history of GERD, and  - Continue esomeprazole  40 mg BID - Resume famotidine  20 mg per prior GI recommendations - Esophageal manometry unremarkable per LPN comment 3/86, recommended reaching out to

## 2023-11-01 NOTE — Progress Notes (Signed)
   SUBJECTIVE:   CHIEF COMPLAINT / HPI:  Eric Hooper is a 48 y.o. male with a pertinent past medical history of T2DM, HTN, TTP, GERD, and seasonal allergies presenting to the clinic for chest tightness and throat burning sensation.  Chest tightness, throat burning When patient is sitting at home, watching TV, patient starts feeling congestion in his chest, clouded breathing (meaning his breathing feels more congested), feels his lower throat start burning painful as well.  Sometimes these episodes occur shortly after eating, especially after breakfast. Denies dyspnea with these symptoms.  No chest pain. Takes esomeprazole  40 mg BID. GI previously told him to take famotidine  20 mg daily with the esomeprazole , but he does not want to take them together. Of note, patient underwent esophageal manometry on 5/28.  Unable to see formal results, but noted in phone call note 6/13 by LPN of Dr. Legrand that manometry was overall unremarkable, no significant abnormality.  Conveyed results to patient with caveat that he should contact GI physician for confirmation and formal results.  Lower back pain Has noticed intermittent pain of the small of his back/tailbone.  Most recently, about a week ago, patient drove an hour to [place] to see his son, experienced this pain during, before, and after. Taking acetaminophen  500 mg TID, which helps. Denies saddle paraesthesias, incontinence of stool or urine, tripping, foot drop, sciatica, and leg numbness/weakness.  PERTINENT PMH / PSH: T2DM, HTN, TTP, GERD, and seasonal allergies Remainder reviewed in problem list.   OBJECTIVE:   BP 136/88   Pulse 72   Ht 5' 10 (1.778 m)   Wt 187 lb (84.8 kg)   SpO2 100%   BMI 26.83 kg/m   General: Age-appropriate, resting comfortably in chair, NAD, alert and at baseline. HEENT: Throat clear, no erythema, tonsils not visible. Cardiovascular: Regular rate and rhythm. Normal S1/S2. No murmurs, rubs, or gallops  appreciated. Pulmonary: Clear bilaterally to ascultation. No wheezes, crackles, or rhonchi. Normal WOB on room air. Abdominal: Hyperactive bowel sounds. No tenderness to deep or light palpation. No rebound or guarding. Extremities: No peripheral edema bilaterally. Capillary refill <2 seconds.   ASSESSMENT/PLAN:   Assessment & Plan Gastroesophageal reflux disease without esophagitis Favor GERD given no chest pain, postprandial association of symptoms, history of GERD, and  - Continue esomeprazole  40 mg BID - Resume famotidine  20 mg per prior GI recommendations - Esophageal manometry unremarkable per LPN comment 3/86, recommended reaching out to  Chronic left shoulder pain Provided phone number for [facility] where patient has prior referral.  Recommended following up now that MRI was denied by insurance for next steps. Lumbar pain Nonspecific pain.  No red flag symptoms to causes concern for neural impingement or cauda equina. - Acetaminophen  500 mg QID (Q6h PRN) - NSAIDs contraindicated due to severity of GERD - Voltaren  gel as needed - Return if symptoms persists after conservative treatment in 1-2 months   No follow-ups on file.  Shauntia Levengood Toma, MD [facility]

## 2023-11-01 NOTE — Assessment & Plan Note (Signed)
 Provided phone number for [facility] where patient has prior referral.  Recommended following up now that MRI was denied by insurance for next steps.

## 2023-11-04 ENCOUNTER — Telehealth: Payer: Self-pay

## 2023-11-04 NOTE — Telephone Encounter (Signed)
 Patient calls nurse line for follow up from visit on 11/01/23.  Patient reports that he is having pain that starts in his left gluteal region and then wraps around into left leg. Describes this as a burning sensation in left thigh.  Denies urinary or fecal incontinence. No numbness.   He states that tylenol  has not been helping.   Advised that I would forward message to provider for recommendations.   ED precautions discussed.   Chiquita JAYSON English, RN

## 2023-11-05 ENCOUNTER — Ambulatory Visit: Admitting: Family Medicine

## 2023-11-05 VITALS — BP 116/70 | HR 68 | Ht 70.0 in | Wt 187.1 lb

## 2023-11-05 DIAGNOSIS — M545 Low back pain, unspecified: Secondary | ICD-10-CM

## 2023-11-05 MED ORDER — LIDOCAINE 5 % EX PTCH: 1.0000 | MEDICATED_PATCH | CUTANEOUS | 0 refills | Status: DC

## 2023-11-05 NOTE — Patient Instructions (Signed)
 You should receive a call within the next couple of weeks to schedule a physical therapy appointment  In the meantime you can try using a lidocaine  patch (I have sent this to your pharmacy) over the painful area  You can also try over-the-counter Voltaren  (diclofenac ) gel  Continue using Tylenol   Please seek medical attention if you develop any weakness or numbness in your legs, loss of sensation in your genital area, loss of control of your bowel/bladder, or fevers

## 2023-11-05 NOTE — Progress Notes (Signed)
    SUBJECTIVE:   CHIEF COMPLAINT / HPI:   Low back/tailbone pain Ongoing x 2 weeks after driving his son to [place] Does report sitting for prolonged periods of time which exacerbates his pain Denies radiation into his legs/feet although occasionally has some tingling sensation in his upper anterior thigh after sitting for long period time  Denies fevers, numbness/weakness in extremities, saddle anesthesia, bowel/bladder incontinence  PERTINENT  PMH / PSH: TTP  OBJECTIVE:   BP 116/70   Pulse 68   Ht 5' 10 (1.778 m)   Wt 187 lb 2 oz (84.9 kg)   SpO2 100%   BMI 26.85 kg/m   General: NAD, pleasant, able to participate in exam Respiratory: No respiratory distress Skin: warm and dry, no rashes noted Psych: Normal affect and mood  MSK/back: Mild tenderness to palpation over lower lumbar/sacral spine and tailbone. Mildly tender over iliac crest bilaterally No significant tenderness or tightness throughout paraspinal muscles Negative straight leg test bilaterally Negative varus/valgus testing of hip   ASSESSMENT/PLAN:   Assessment & Plan Low back pain without sciatica, unspecified back pain laterality, unspecified chronicity Unclear etiology but it seems most of his symptoms are related to prolonged sitting which can also contribute to coccyx pain No obvious sign of radiculopathy or red flags for spinal cord compression  Suspect his reported tingling in anterior thigh is 2/2 lateral femoral cutaneous nerve compression given association with prolonged sitting -- discussed precautions for this  Discussed supportive care for coccyx / low back pain, do not feel imaging is needed today Unable to use systemic NSAIDs given hx TTP and severe GERD Rx lidocaine  patch  Can also try OTC voltaren  gel  Referral to PT for eval/treatment  Pt agrees with above management and we discussed red flags for more prompt evaluation   Payton Coward, MD [facility]

## 2023-11-05 NOTE — Telephone Encounter (Signed)
 Patient returns call to nurse line.   Scheduled for this afternoon in ATC.   Eric JAYSON English, RN

## 2023-11-07 ENCOUNTER — Ambulatory Visit: Payer: Self-pay | Admitting: Gastroenterology

## 2023-11-08 ENCOUNTER — Other Ambulatory Visit: Payer: Self-pay | Admitting: Radiology

## 2023-11-08 ENCOUNTER — Telehealth: Payer: Self-pay

## 2023-11-08 DIAGNOSIS — M5412 Radiculopathy, cervical region: Secondary | ICD-10-CM

## 2023-11-08 DIAGNOSIS — G8929 Other chronic pain: Secondary | ICD-10-CM

## 2023-11-08 NOTE — Telephone Encounter (Signed)
 Pharmacy Patient Advocate Encounter   Received notification from CoverMyMeds that prior authorization for JARDIANCE  10MG  is required/requested.   Insurance verification completed.   The patient is insured through [facility] .   PA required; PA submitted to above mentioned insurance via CoverMyMeds Key/confirmation #/EOC [id number]. Status is pending

## 2023-11-09 NOTE — Telephone Encounter (Signed)
 Pharmacy Patient Advocate Encounter  Received notification from [facility] that Prior Authorization for JARDIANCE  10MG  has been APPROVED from 11/08/23 to 11/07/24   PA #/Case ID/Reference #: [id number]

## 2023-11-12 ENCOUNTER — Ambulatory Visit: Admitting: Gastroenterology

## 2023-11-12 ENCOUNTER — Encounter: Payer: Self-pay | Admitting: Gastroenterology

## 2023-11-12 VITALS — BP 118/68 | HR 71 | Ht 70.0 in | Wt 184.5 lb

## 2023-11-12 DIAGNOSIS — R12 Heartburn: Secondary | ICD-10-CM | POA: Diagnosis not present

## 2023-11-12 NOTE — Progress Notes (Signed)
 [facility] Progress Note  Chief Complaint: Chest pain and heartburn  Subjective  Prior history  Years of heartburn with variable relief for multiple PPIs as well as H2 blocker EGD September 2020 for normal-appearing esophagus stomach and duodenum.  Biopsies with minimal reflux changes in distal esophagus, normal and midesophagus, no EOE. May 2025 esophageal manometry normal.  pH/impedance study with minimal reflux (DeMeester score at physiologic level)   History of Present Illness  Eric Hooper was here today to discuss results of his recent testing. He continues to take Nexium  1-2 times a day depending on his heartburn, still sometimes takes famotidine  but does not find it gives any additional benefit.  Despite taking those meds he will have at least several episodes a week (including last evening and this morning) a persistent heartburn and feelings of food not passing well through the upper esophagus.  This occurred last evening after having a plum and some water. Fortunately he stopped smoking about 2 months ago He is concerned about his ongoing symptoms and wanted to discuss further treatment options.   ROS: Cardiovascular:  no chest pain Respiratory: no dyspnea  The patient's Past Medical, Family and Social History were reviewed and are on file in the EMR. Past Medical History:  Diagnosis Date   At risk for dysfunction of heart 07/11/2018   Chest wall pain 08/14/2018   Controlled diabetes mellitus type 2 with complications (HCC) 02/25/2018   Diabetes mellitus (HCC) 02/01/2017   Diabetes mellitus without complication (HCC)    non-insulin  dependent   Diverticulosis 02/01/2017   on CT scan abd/pelvis   Eczema    GERD (gastroesophageal reflux disease)    Hypertension    Kidney stones 02/03/2017   Leg swelling 05/12/2017   Macrocytic anemia 02/01/2017   Microscopic hematuria 02/01/2017   Mixed hyperlipidemia 02/28/2018   Phlebitis 05/12/2017   Recurrent upper  respiratory infection (URI)    Renal insufficiency 02/03/2017   Smoker 04/13/2017    Past Surgical History:  Procedure Laterality Date   COLONOSCOPY     ELBOW SURGERY Right    ESOPHAGEAL MANOMETRY N/A 09/22/2023   Procedure: MANOMETRY, ESOPHAGUS;  Surgeon: Legrand Victory LITTIE DOUGLAS, MD;  Location: [facility];  Service: Gastroenterology;  Laterality: N/A;   IR FLUORO GUIDE CV LINE RIGHT  02/02/2017   IR FLUORO GUIDE CV LINE RIGHT  11/23/2021   IR FLUORO GUIDE CV LINE RIGHT  11/28/2021   IR REMOVAL TUN CV CATH W/O FL  02/26/2022   IR US  GUIDE VASC ACCESS RIGHT  02/02/2017   IR US  GUIDE VASC ACCESS RIGHT  11/23/2021   PH IMPEDANCE STUDY N/A 09/22/2023   Procedure: IMPEDANCE PH STUDY, ESOPHAGUS;  Surgeon: Legrand Victory LITTIE DOUGLAS, MD;  Location: [facility];  Service: Gastroenterology;  Laterality: N/A;   UPPER GASTROINTESTINAL ENDOSCOPY       Objective:  Med list reviewed  Current Outpatient Medications:    acetaminophen  (TYLENOL ) 325 MG tablet, Take 2 tablets (650 mg total) by mouth every 4 (four) hours as needed (discomfort or fever)., Disp: 20 tablet, Rfl: 0   amLODipine  (NORVASC ) 10 MG tablet, TAKE 1 TABLET BY MOUTH EVERYDAY AT BEDTIME, Disp: 90 tablet, Rfl: 3   aspirin  EC 81 MG tablet, Take 1 tablet (81 mg total) by mouth daily. Swallow whole., Disp: , Rfl:    atorvastatin  (LIPITOR) 40 MG tablet, TAKE 1 TABLET BY MOUTH EVERY DAY, Disp: 90 tablet, Rfl: 1   blood glucose meter kit and supplies, Dispense based on patient  and insurance preference. Use up to four times daily as directed. (FOR ICD-10 E10.9, E11.9)., Disp: 1 each, Rfl: 0   cetirizine  (ZYRTEC ) 10 MG tablet, Take 10 mg by mouth daily., Disp: , Rfl:    empagliflozin  (JARDIANCE ) 10 MG TABS tablet, Take 1 tablet (10 mg total) by mouth daily., Disp: 90 tablet, Rfl: 3   EPINEPHrine  0.3 mg/0.3 mL IJ SOAJ injection, Inject 0.3 mg into the muscle as needed for anaphylaxis., Disp: 1 each, Rfl: 0   eplerenone  (INSPRA ) 25 MG tablet, TAKE 1  TABLET BY MOUTH EVERYDAY AT BEDTIME, Disp: 90 tablet, Rfl: 0   esomeprazole  (NEXIUM ) 40 MG capsule, TAKE 1 CAPSULE (40 MG TOTAL) BY MOUTH 2 (TWO) TIMES DAILY BEFORE A MEAL., Disp: 180 capsule, Rfl: 1   famotidine  (PEPCID ) 20 MG tablet, Take 1 tablet (20 mg total) by mouth daily before lunch., Disp: 90 tablet, Rfl: 1   losartan  (COZAAR ) 100 MG tablet, Take 1 tablet (100 mg total) by mouth at bedtime., Disp: 90 tablet, Rfl: 3   montelukast  (SINGULAIR ) 10 MG tablet, Take 1 tablet (10 mg total) by mouth at bedtime., Disp: 90 tablet, Rfl: 0   Multiple Vitamin (MULTIVITAMIN) capsule, Take 1 capsule by mouth daily., Disp: , Rfl:    Olopatadine-Mometasone (RYALTRIS ) 665-25 MCG/ACT SUSP, Place 2 sprays into the nose 2 (two) times daily as needed., Disp: 29 g, Rfl: 5   lidocaine  (LIDODERM ) 5 %, Place 1 patch onto the skin daily. Remove & Discard patch within 12 hours. Apply to lower back/tailbone (Patient not taking: Reported on 11/12/2023), Disp: 30 patch, Rfl: 0   Vital signs in last 24 hrs: Vitals:   11/12/23 0931  BP: 118/68  Pulse: 71   Wt Readings from Last 3 Encounters:  11/12/23 184 lb 8 oz (83.7 kg)  11/05/23 187 lb 2 oz (84.9 kg)  11/01/23 187 lb (84.8 kg)    Physical Exam  No exam Entire visit spent in review of symptoms results and plan  Labs:   ___________________________________________ Radiologic studies:   ____________________________________________ Other:  Esophageal manometry and pH/impedance test results reviewed and noted above (reports on file) _____________________________________________   Encounter Diagnoses  Name Primary?   Heartburn Yes   Functional heartburn    Assessment & Plan  While I believe he has some occasional reflux (probably physiologic amount) and that is why he gets some improvement from acid suppression medicine, he also has functional heartburn.  I spent some time explaining to him the nature of this is essentially hypersensitivity of the  esophagus sensory nerves.  Therefore acid suppression is likely to be of limited benefit since he has little demonstrable reflux on testing.  I offered him a trial of amitriptyline to take at bedtime (usually starting 25 mg for 10 to 12 days, then increasing if tolerated to 50 mg at night). He feels he has a lot of health issues going on right now and does not want to take any additional medications, having felt reassured that his testing did not show a serious problem. I also offered either now or in the future some Carafate  tablets that he could dissolve into a slurry to take as needed for severe heartburn.  He also again politely declined that but said he would reconsider it and let me know if he changes his mind.   22 minutes were spent on this encounter (including chart review, history/exam, counseling/coordination of care, and documentation) > 50% of that time was spent on counseling and coordination of care.  Victory LITTIE Brand III

## 2023-11-12 NOTE — Patient Instructions (Signed)
 _______________________________________________________  If your blood pressure at your visit was 140/90 or greater, please contact your primary care physician to follow up on this.  _______________________________________________________  If you are age 50 or older, your body mass index should be between 23-30. Your Body mass index is 26.47 kg/m. If this is out of the aforementioned range listed, please consider follow up with your Primary Care Provider.  If you are age 19 or younger, your body mass index should be between 19-25. Your Body mass index is 26.47 kg/m. If this is out of the aformentioned range listed, please consider follow up with your Primary Care Provider.   ________________________________________________________  The [facility] providers would like to encourage you to use MYCHART to communicate with providers for non-urgent requests or questions.  Due to long hold times on the telephone, sending your provider a message by [facility] may be a faster and more efficient way to get a response.  Please allow 48 business hours for a response.  Please remember that this is for non-urgent requests.  _______________________________________________________

## 2023-11-15 NOTE — Therapy (Signed)
 OUTPATIENT PHYSICAL THERAPY THORACOLUMBAR EVALUATION   Patient Name: Eric Hooper MRN: [id number] DOB:[birth year], 48 y.o., male Today's Date: 11/16/2023  END OF SESSION:  PT End of Session - 11/16/23 0755     Visit Number 1    Number of Visits 13    Date for PT Re-Evaluation 12/28/23    Authorization Type [facility]    Authorization Time Period auth after 27 visits per appt notes    PT Start Time 0755   late check in   PT Stop Time 0835    PT Time Calculation (min) 40 min    Activity Tolerance Patient tolerated treatment well;No increased pain          Past Medical History:  Diagnosis Date   At risk for dysfunction of heart 07/11/2018   Chest wall pain 08/14/2018   Controlled diabetes mellitus type 2 with complications (HCC) 02/25/2018   Diabetes mellitus (HCC) 02/01/2017   Diabetes mellitus without complication (HCC)    non-insulin  dependent   Diverticulosis 02/01/2017   on CT scan abd/pelvis   Eczema    GERD (gastroesophageal reflux disease)    Hypertension    Kidney stones 02/03/2017   Leg swelling 05/12/2017   Macrocytic anemia 02/01/2017   Microscopic hematuria 02/01/2017   Mixed hyperlipidemia 02/28/2018   Phlebitis 05/12/2017   Recurrent upper respiratory infection (URI)    Renal insufficiency 02/03/2017   Smoker 04/13/2017   Past Surgical History:  Procedure Laterality Date   COLONOSCOPY     ELBOW SURGERY Right    ESOPHAGEAL MANOMETRY N/A 09/22/2023   Procedure: MANOMETRY, ESOPHAGUS;  Surgeon: Legrand Victory LITTIE DOUGLAS, MD;  Location: [facility];  Service: Gastroenterology;  Laterality: N/A;   IR FLUORO GUIDE CV LINE RIGHT  02/02/2017   IR FLUORO GUIDE CV LINE RIGHT  11/23/2021   IR FLUORO GUIDE CV LINE RIGHT  11/28/2021   IR REMOVAL TUN CV CATH W/O FL  02/26/2022   IR US  GUIDE VASC ACCESS RIGHT  02/02/2017   IR US  GUIDE VASC ACCESS RIGHT  11/23/2021   PH IMPEDANCE STUDY N/A 09/22/2023   Procedure: IMPEDANCE PH STUDY, ESOPHAGUS;  Surgeon:  Legrand Victory LITTIE DOUGLAS, MD;  Location: [facility];  Service: Gastroenterology;  Laterality: N/A;   UPPER GASTROINTESTINAL ENDOSCOPY     Patient Active Problem List   Diagnosis Date Noted   Foot lesion 09/03/2023   Headache 07/23/2022   Cervical disc disorder with radiculopathy of cervical region 06/20/2020   Seasonal allergic rhinitis 03/05/2020   Chronic left shoulder pain 11/27/2019   GERD (gastroesophageal reflux disease) 08/10/2019   Hypertension associated with diabetes (HCC) 08/10/2019   Insomnia 06/09/2019   PICC (peripherally inserted central catheter) in place 09/05/2018   Mixed diabetic hyperlipidemia associated with type 2 diabetes mellitus (HCC) 02/28/2018   Type 2 diabetes mellitus without complications (HCC) 02/25/2018   Tobacco use disorder 04/13/2017   T.T.P. syndrome (HCC) 02/06/2017   Thrombocytopenia (HCC) 02/01/2017    PCP: Toma Matas, MD  REFERRING PROVIDER: Trudy Duwaine BRAVO, NP  REFERRING DIAG: [phone] (ICD-10-CM) - Radiculopathy, cervical region M25.512,G89.29 (ICD-10-CM) - Chronic left shoulder pain  Rationale for Evaluation and Treatment: Rehabilitation  THERAPY DIAG:  Cervicalgia  Left shoulder pain, unspecified chronicity  Muscle weakness (generalized)  ONSET DATE: acute on chronic, exacerbation beginning ~3 months ago  SUBJECTIVE:  SUBJECTIVE STATEMENT: Pt endorses prior issues with L neck/shoulder that were improving, but about 3 months ago woke up with increased pain. States at one point he was supposed to get a shoulder surgery but was rescheduled d/t his TTP syndrome. He states this exacerbation is not really worsening since onset but is not improving, has been trying to do exercises on his own and feel like they aggravate it. Less pain with rest, worsens  with shoulder elevation and WB, lying on L side. Notes that he will sometimes get L shoulder/pec discomfort with abduction (not flexion) and sidelying/WB - states he has spoken w/ provider about it, denies any cardiac symptoms or red flags. No N/T, no headaches, no speech/swallowing issues, no vision issues. Does endorse difficulty picking up objects with L hand. States he is now requiring assistive devices for self care/hygiene tasks (I.e washing behind back)  We do have 2 referrals, one for back and one for neck/shoulder. He states he would prefer to focus on neck/shoulder as it affects him more, back pain still present but has improved. Mostly provoked by sitting and transfers after sitting.    PERTINENT HISTORY:  DM2, GERD, TTP syndrome, HTN, headaches, insomnia  PAIN:  Are you having pain: 8/10 Location/description: L neck/shoulder down to elbow, more anteriorly but sometimes posteriorly , throbbing Best-worst over past week: 5-10/10  - aggravating factors: raising arm, reaching behind back, lying on L side - Easing factors: hot water, stretching, icing, resting at side  PRECAUTIONS: TTP syndrome  RED FLAGS: None   WEIGHT BEARING RESTRICTIONS: No  FALLS:  Has patient fallen in last 6 months? No  LIVING ENVIRONMENT: 1 story home, 3 STE BIL rail Lives w/ fiancee, housework split  OCCUPATION: not working since TTP per pt report. Used to work for a company doing heavy lifting, Warden/ranger  PLOF: Independent  PATIENT GOALS: less pain, go back to work, be able to raise arm  NEXT MD VISIT: after PT per pt report   OBJECTIVE:  Note: Objective measures were completed at Evaluation unless otherwise noted.  DIAGNOSTIC FINDINGS:  No recent spine imaging in chart  PATIENT SURVEYS:  NDI: 34/50, 68%  COGNITION: Overall cognitive status: Within functional limits for tasks assessed     SENSATION: LT intact BIL UE   POSTURE: FHP, guarded LUE posture, L UT elevation >  R    CERVICAL ROM:    A/PROM  eval  Flexion 55 deg s  Extension 10 deg neck pain  Right lateral flexion 30 deg  Left lateral flexion 30 deg *  Right rotation 35 deg *  Left rotation 20 deg *   (Blank rows = not tested) (Key: WFL = within functional limits not formally assessed, * = concordant pain, s = stiffness/stretching sensation, NT = not tested)  Comments:     RANGE OF MOTION:      Right eval Left eval  Shoulder flexion WFL A: 124 deg *  Shoulder abduction  A: 96 deg *  Functional ER combo    Functional IR combo    Hip ER    Hip IR    Knee extension    Ankle dorsiflexion     (Blank rows = not tested) (Key: WFL = within functional limits not formally assessed, * = concordant pain, s = stiffness/stretching sensation, NT = not tested)  Comments:    STRENGTH TESTING:  MMT Right eval Left eval  Shoulder flexion 5 4 **  Shoulder abduction 5 4*  Elbow flexion  Elbow extension    Grip strength  115# 60#  Hip flexion    Hip abduction (modified sitting)    Knee flexion    Knee extension    Ankle dorsiflexion    Ankle plantarflexion     (Blank rows = not tested) (Key: WFL = within functional limits not formally assessed, * = concordant pain, s = stiffness/stretching sensation, NT = not tested)  Comments:     FUNCTIONAL TESTS:  See grip strength and functional reaching above   TREATMENT DATE:  [facility] Adult PT Treatment:                                                DATE: 11/16/23 deferred                                                                                                                            PATIENT EDUCATION:  Education details: Pt education on PT impairments, prognosis, and POC. Informed consent. Rationale for interventions Person educated: Patient Education method: Explanation, Demonstration, Tactile cues, Verbal cues Education comprehension: verbalized understanding, returned demonstration, verbal cues required, tactile cues  required, and needs further education    HOME EXERCISE PROGRAM: deferred  ASSESSMENT:  CLINICAL IMPRESSION: Patient is a 48 y.o. gentleman who was seen today for physical therapy evaluation and treatment for neck/shoulder pain- also has referral for back but he states he would prefer to focus on the former as it is affecting daily activities more prominently. No red flags today - he is inquisitive re: some L anterior shoulder/pec pain, only provoked by WB through shoulder and abduction, denies any cardiac symptoms or red flags. On exam he demonstrates concordant limitations in shoulder/cervical mobility, GH/grip strength. Tolerates exam well overall, deferring HEP w/ time constraints. No adverse events. Recommend trial of skilled PT to address aforementioned deficits with aim of improving functional tolerance and reducing pain with typical activities. Pt departs today's session in no acute distress, all voiced concerns/questions addressed appropriately from PT perspective.    OBJECTIVE IMPAIRMENTS: decreased activity tolerance, decreased endurance, decreased mobility, decreased ROM, decreased strength, impaired perceived functional ability, impaired UE functional use, and pain.   ACTIVITY LIMITATIONS: carrying, lifting, sitting, sleeping, bathing, dressing, reach over head, and hygiene/grooming  PARTICIPATION LIMITATIONS: meal prep, cleaning, laundry, community activity, and occupation  PERSONAL FACTORS: Time since onset of injury/illness/exacerbation and 3+ comorbidities: DM2, GERD, TTP syndrome, HTN, headaches, insomnia are also affecting patient's functional outcome.   REHAB POTENTIAL: Good  CLINICAL DECISION MAKING: Evolving/moderate complexity  EVALUATION COMPLEXITY: Moderate   GOALS:   SHORT TERM GOALS: Target date: 12/07/2023  Pt will demonstrate appropriate understanding and performance of initially prescribed HEP in order to facilitate improved independence with management of  symptoms.  Baseline: HEP TBD  Goal status: INITIAL   2. Pt will report at least 25%  improvement in overall pain levels over past week in order to facilitate improved tolerance to typical daily activities.   Baseline: 5-10/10  Goal status: INITIAL    LONG TERM GOALS: Target date: 12/28/2023  Pt will score less than or equal to 20/50 (40%) on NDI in order to demonstrate improved perception of function due to symptoms ([facility]C 10-13 pts per Neysa dunker al 2009, 2010). Baseline: 34/50; 68% Goal status: INITIAL  2. Pt will demonstrate at least 50 degrees of active cervical rotation ROM bilaterally in order to demonstrate improved environmental awareness and safety with driving.  Baseline: see ROM chart above Goal status: INITIAL  3. Pt will demonstrate at least 4+/5 shoulder flex/abd MMT BIL for improved symmetry of UE strength and improved tolerance to functional movements.  Baseline: see MMT chart above Goal status: INITIAL   4. Pt will report ability to perform usual self care/hygiene tasks with integration of BIL UE for improved functional tolerance.  Baseline: reports relying solely on RUE for washing back Goal status: INITIAL   5. Pt will report at least 50% decrease in overall pain levels in past week in order to facilitate improved tolerance to basic ADLs/mobility.   Baseline: 5-10/10  Goal status: INITIAL    PLAN:  PT FREQUENCY: 2x/week  PT DURATION: 6 weeks  PLANNED INTERVENTIONS: 97164- PT Re-evaluation, 97750- Physical Performance Testing, 97110-Therapeutic exercises, 97530- Therapeutic activity, 97112- Neuromuscular re-education, 97535- Self Care, [id number]- Manual therapy, Patient/Family education, Balance training, Taping, Joint mobilization, Spinal mobilization, Cryotherapy, and Moist heat.  PLAN FOR NEXT SESSION: establish HEP. Work on gentle GH/periscapular strengthening, GH mobility, grip strengthening. Symptom modification strategies as indicated/appropriate, mindful of TTP  syndrome.   Alm DELENA Jenny PT, DPT 11/16/2023 9:04 AM

## 2023-11-16 ENCOUNTER — Encounter: Payer: Self-pay | Admitting: Physical Therapy

## 2023-11-16 ENCOUNTER — Ambulatory Visit: Attending: Physical Medicine and Rehabilitation | Admitting: Physical Therapy

## 2023-11-16 ENCOUNTER — Other Ambulatory Visit: Payer: Self-pay

## 2023-11-16 DIAGNOSIS — M25512 Pain in left shoulder: Secondary | ICD-10-CM | POA: Diagnosis not present

## 2023-11-16 DIAGNOSIS — G8929 Other chronic pain: Secondary | ICD-10-CM | POA: Insufficient documentation

## 2023-11-16 DIAGNOSIS — M5412 Radiculopathy, cervical region: Secondary | ICD-10-CM | POA: Diagnosis not present

## 2023-11-16 DIAGNOSIS — M542 Cervicalgia: Secondary | ICD-10-CM | POA: Diagnosis not present

## 2023-11-16 DIAGNOSIS — M6281 Muscle weakness (generalized): Secondary | ICD-10-CM | POA: Diagnosis not present

## 2023-11-17 ENCOUNTER — Other Ambulatory Visit: Payer: Self-pay | Admitting: Family Medicine

## 2023-11-17 DIAGNOSIS — I10 Essential (primary) hypertension: Secondary | ICD-10-CM

## 2023-11-21 DIAGNOSIS — M5442 Lumbago with sciatica, left side: Secondary | ICD-10-CM | POA: Diagnosis not present

## 2023-11-21 NOTE — Progress Notes (Signed)
 Subjective   Eric Hooper is a 48 y.o. (DOB 1975/09/10) male.     Patient presents with  . Back Pain    Lower back pain x1 week now and hurts down his left leg. Pt states he has TTP     History of Present Illness This is a 48 year old patient presenting with low back pain.  No known injury.  No back pain red flags, no saddle anesthesia no bowel or bladder incontinence, no lower extremity weakness  The patient has been experiencing low back pain for approximately 4 weeks. The pain began suddenly one morning with a burning sensation in the lower back, localized to the left side and radiating down to the thigh. Over the past 3 days, the pain has intensified, becoming sharp and extending throughout the back radiating to bilateral hips and down the left thigh. The patient reports feeling pressure when sitting on the toilet, and leaning forward exacerbates the pain. The patient does not experience any numbness in the genitals or loss of bowel or bladder control and confirms good sensation in both legs. The patient has been managing the pain with Tylenol  due to an inability to tolerate ibuprofen. The patient sought medical attention from another doctor who recommended physical therapy, which is scheduled to start on 11/24/2023. Additionally, the patient has shoulder pain that needs to be addressed first.   Reviewed and updated this visit by provider: Tobacco  Allergies  Meds  Problems  Med Hx  Surg Hx  Fam Hx           History reviewed. No pertinent past medical history.  Allergies[1]   History reviewed. No pertinent surgical history.  History reviewed. No pertinent family history.  Social History[2]   Review of Systems Review of Systems - All systems reviewed and are negative except what is noted in the HPI.   Objective   Vitals:   11/21/23 1658  BP: 145/90  Patient Position: Sitting  Pulse: 67  Temp: 98.5 F (36.9 C)  TempSrc: Oral  Resp: 18  Height: 5' 10 (1.778  m)  Weight: 185 lb (83.9 kg)  SpO2: 98%  BMI (Calculated): 26.5  PainSc:   8  PainLoc: Back   Physical Exam Physical Exam Vital signs: Within normal limits. General appearance: Alert, appears stated age, cooperative and no distress. Head: Normocephalic, without obvious abnormality, atraumatic. Respiratory: Effort normal, no labored breathing Back, Musculoskeletal: Negative straight leg raise bilaterally. Tenderness in the right lower lumbar region. Pain with lateral movement on the right side.  Limited range of motion with forward flexion due to pain, back extension normal.  No ecchymosis no mass no  no erythema.  Sensation intact.  No lower extremity weakness. Skin: Warm and dry, no rash. Neurological: Good sensation in both legs.   Results   Results for orders placed or performed in visit on 04/22/22  POCT Strep Nucleic Acid   Collection Time: 04/22/22  9:59 AM  Result Value Ref Range   Strep Negative Negative  POCT CoVid-19 nucleic acid   Collection Time: 04/22/22 10:03 AM  Result Value Ref Range   SARS-COV-2, NAA Negative Negative  POCT Flu Nucleic Acid   Collection Time: 04/22/22 10:03 AM  Result Value Ref Range   Influenza A/B A Positive (A) Negative    No results found.   Impression/Plan   Assessment & Plan 1. Low back pain: Chronic. - Prescribed muscle relaxer, Flexeril 5 to 10 mg every 8 hours as needed this medication may cause sedation  so do not take and drive. - Continue taking Tylenol  - Referral to neuro spine for further evaluation - Possible imaging of the back - Advised to go straight to the ER if experiencing numbness in the genitals or loss of bowel or bladder control  Follow-up - Referral to neuro spine for further evaluation  1. Acute bilateral low back pain with left-sided sciatica (Primary) -     cyclobenzaprine (FLEXERIL) 5 mg tablet; Take one tablet to two tablets (5-10 mg dose) by mouth 3 (three) times a day as needed for Muscle spasms for  up to 10 days., Starting Sun 11/21/2023, Until Wed 12/01/2023 at 2359, Normal -     Ambulatory Referral to Neuro/Spine Program     Risks, benefits, and alternatives of the medications and treatment plan prescribed today were discussed, and patient expressed understanding. Plan follow-up as discussed or as needed if any worsening symptoms or change in condition.      Follow up for PCP, ED if symptoms worsen.  Attestation: Designer, fashion/clothing was used to create visit note. Consent from the patient/caregiver was obtained prior to its use.        [1] Allergies Allergen Reactions  . Ibuprofen Other and Unknown    N/a  [2] Social History Socioeconomic History  . Marital status: Unknown  Tobacco Use  . Smoking status: Former    Types: Cigarettes

## 2023-11-22 ENCOUNTER — Other Ambulatory Visit

## 2023-11-22 ENCOUNTER — Ambulatory Visit

## 2023-11-22 VITALS — BP 126/80 | HR 76 | Ht 69.5 in | Wt 181.8 lb

## 2023-11-22 DIAGNOSIS — E119 Type 2 diabetes mellitus without complications: Secondary | ICD-10-CM | POA: Diagnosis not present

## 2023-11-22 DIAGNOSIS — R634 Abnormal weight loss: Secondary | ICD-10-CM | POA: Diagnosis not present

## 2023-11-22 LAB — POCT SEDIMENTATION RATE: POCT SED RATE: 0 mm/h (ref 0–22)

## 2023-11-22 NOTE — Progress Notes (Signed)
    SUBJECTIVE:   CHIEF COMPLAINT / HPI:   Eric Hooper is a 48 YO male present today concerned for weight loss. Pt states haws been losing about 6-10 lb weekly in the past few months. He has a history of TTP, DMII, and Chronic back pain. He was recently seen at the urgent care yesterday for his back pain he was prescribed with muscle relaxation which have help his symptom. He denies change in appetite, abdominal pain, change in bladder/bowel habit, fever, or s/s of infection. Pt request additional labs today for additional weight lose work up. His last Lab before this visit was last month 10/13/23.   PERTINENT  PMH / PSH:  Thrombocytopenia TTP GERD HTN DM II HLD Chronic back and Shoulder pain   OBJECTIVE:   BP 126/80   Pulse 76   Ht 5' 9.5 (1.765 m)   Wt 181 lb 12.8 oz (82.5 kg)   SpO2 98%   BMI 26.46 kg/m   Physical Exam Constitutional:      Appearance: Normal appearance.  Cardiovascular:     Rate and Rhythm: Normal rate and regular rhythm.     Pulses: Normal pulses.  Pulmonary:     Effort: Pulmonary effort is normal.     Breath sounds: Normal breath sounds.  Abdominal:     Palpations: Abdomen is soft.  Musculoskeletal:        General: Normal range of motion.  Neurological:     General: No focal deficit present.     Mental Status: He is alert. Mental status is at baseline.      ASSESSMENT/PLAN:   Eric Hooper is a 48 YO male present today concerned for weight loss. Pt states haws been losing about 6-10 lb weekly in the past few months. He has a history of TTP, DMII, and Chronic back pain. His physical exam was WNL without any red flag. Pt appear anxious about his weight lose. We decided to collecting additional labs today for further work up Assessment & Plan Weight loss - Daily weight log provided to patient for documenting his weight at home until next visit - Repeat Labs : CBC, ESR, Hepatitis panel, and CMP - Return in 1 month    Plan discuss with Dr.Pray  attending physician who agreed with plan.     Eric Staubs, DO PGY1 Family Medicine Resident [facility]

## 2023-11-22 NOTE — Patient Instructions (Signed)
   It was great to see you!  Our plans for today:  - Please record your weight daily - We will see you in 1 month on August 27,2025  We are checking some labs today, we will release these results to your MyChart.  Take care and seek immediate care sooner if you develop any concerns.     [name] HAS PGY 1 Family Medicine Resident [facility]  [address] [place], [place] [address] Fax [phone] Phone [phone] 11/22/2023, 10:08 AM

## 2023-11-23 ENCOUNTER — Ambulatory Visit: Payer: Self-pay | Admitting: Endocrinology

## 2023-11-23 ENCOUNTER — Encounter: Payer: Self-pay | Admitting: Oncology

## 2023-11-23 LAB — COMPREHENSIVE METABOLIC PANEL WITH GFR
ALT: 28 IU/L (ref 0–44)
AST: 18 IU/L (ref 0–40)
Albumin: 4.7 g/dL (ref 4.1–5.1)
Alkaline Phosphatase: 168 IU/L — ABNORMAL HIGH (ref 44–121)
BUN/Creatinine Ratio: 13 (ref 9–20)
BUN: 13 mg/dL (ref 6–24)
Bilirubin Total: 1.4 mg/dL — ABNORMAL HIGH (ref 0.0–1.2)
CO2: 22 mmol/L (ref 20–29)
Calcium: 10.2 mg/dL (ref 8.7–10.2)
Chloride: 104 mmol/L (ref 96–106)
Creatinine, Ser: 1.03 mg/dL (ref 0.76–1.27)
Globulin, Total: 2.2 g/dL (ref 1.5–4.5)
Glucose: 79 mg/dL (ref 70–99)
Potassium: 4.5 mmol/L (ref 3.5–5.2)
Sodium: 145 mmol/L — ABNORMAL HIGH (ref 134–144)
Total Protein: 6.9 g/dL (ref 6.0–8.5)
eGFR: 88 mL/min/1.73 (ref >59)

## 2023-11-23 LAB — CBC
Hematocrit: 52.5 % — ABNORMAL HIGH (ref 37.5–51.0)
Hemoglobin: 17.6 g/dL (ref 13.0–17.7)
MCH: 31.8 pg (ref 26.6–33.0)
MCHC: 33.5 g/dL (ref 31.5–35.7)
MCV: 95 fL (ref 79–97)
Platelets: 189 x10E3/uL (ref 150–450)
RBC: 5.53 x10E6/uL (ref 4.14–5.80)
RDW: 13.3 % (ref 11.6–15.4)
WBC: 10.8 x10E3/uL (ref 3.4–10.8)

## 2023-11-23 LAB — BASIC METABOLIC PANEL WITH GFR
BUN: 14 mg/dL (ref 7–25)
CO2: 26 mmol/L (ref 20–32)
Calcium: 9.8 mg/dL (ref 8.6–10.3)
Chloride: 106 mmol/L (ref 98–110)
Creat: 0.92 mg/dL (ref 0.70–1.30)
Glucose, Bld: 106 mg/dL — ABNORMAL HIGH (ref 65–99)
Potassium: 3.8 mmol/L (ref 3.5–5.3)
Sodium: 140 mmol/L (ref 135–146)
eGFR: 101 mL/min/1.73m2 (ref >=60)

## 2023-11-23 LAB — HIV ANTIBODY (ROUTINE TESTING W REFLEX): HIV Screen 4th Generation wRfx: NONREACTIVE

## 2023-11-23 LAB — HCV INTERPRETATION

## 2023-11-23 LAB — ACUTE VIRAL HEPATITIS (HAV, HBV, HCV)
HCV Ab: NONREACTIVE
Hep A IgM: NEGATIVE
Hep B C IgM: NEGATIVE
Hepatitis B Surface Ag: NEGATIVE

## 2023-11-23 LAB — HEMOGLOBIN A1C
Hgb A1c MFr Bld: 5.9 % — ABNORMAL HIGH (ref <5.7)
Mean Plasma Glucose: 123 mg/dL
eAG (mmol/L): 6.8 mmol/L

## 2023-11-24 ENCOUNTER — Ambulatory Visit

## 2023-11-24 DIAGNOSIS — M25512 Pain in left shoulder: Secondary | ICD-10-CM | POA: Diagnosis not present

## 2023-11-24 DIAGNOSIS — G8929 Other chronic pain: Secondary | ICD-10-CM | POA: Diagnosis not present

## 2023-11-24 DIAGNOSIS — M6281 Muscle weakness (generalized): Secondary | ICD-10-CM

## 2023-11-24 DIAGNOSIS — M5412 Radiculopathy, cervical region: Secondary | ICD-10-CM | POA: Diagnosis not present

## 2023-11-24 DIAGNOSIS — M542 Cervicalgia: Secondary | ICD-10-CM | POA: Diagnosis not present

## 2023-11-24 NOTE — Therapy (Signed)
 OUTPATIENT PHYSICAL THERAPY THORACOLUMBAR EVALUATION   Patient Name: Eric Hooper MRN: [id number] DOB:02-25-1976, 48 y.o., male Today's Date: 11/24/2023  END OF SESSION:  PT End of Session - 11/24/23 0844     Visit Number 2    Number of Visits 13    Date for PT Re-Evaluation 12/28/23    Authorization Type [facility]    Authorization Time Period auth after 27 visits per appt notes    PT Start Time 0835    PT Stop Time 0910    PT Time Calculation (min) 35 min    Behavior During Therapy [facility] for tasks assessed/performed           Past Medical History:  Diagnosis Date   At risk for dysfunction of heart 07/11/2018   Chest wall pain 08/14/2018   Controlled diabetes mellitus type 2 with complications (HCC) 02/25/2018   Diabetes mellitus (HCC) 02/01/2017   Diabetes mellitus without complication (HCC)    non-insulin  dependent   Diverticulosis 02/01/2017   on CT scan abd/pelvis   Eczema    GERD (gastroesophageal reflux disease)    Hypertension    Kidney stones 02/03/2017   Leg swelling 05/12/2017   Macrocytic anemia 02/01/2017   Microscopic hematuria 02/01/2017   Mixed hyperlipidemia 02/28/2018   Phlebitis 05/12/2017   Recurrent upper respiratory infection (URI)    Renal insufficiency 02/03/2017   Smoker 04/13/2017   Past Surgical History:  Procedure Laterality Date   COLONOSCOPY     ELBOW SURGERY Right    ESOPHAGEAL MANOMETRY N/A 09/22/2023   Procedure: MANOMETRY, ESOPHAGUS;  Surgeon: Legrand Victory LITTIE DOUGLAS, MD;  Location: [facility];  Service: Gastroenterology;  Laterality: N/A;   IR FLUORO GUIDE CV LINE RIGHT  02/02/2017   IR FLUORO GUIDE CV LINE RIGHT  11/23/2021   IR FLUORO GUIDE CV LINE RIGHT  11/28/2021   IR REMOVAL TUN CV CATH W/O FL  02/26/2022   IR US  GUIDE VASC ACCESS RIGHT  02/02/2017   IR US  GUIDE VASC ACCESS RIGHT  11/23/2021   PH IMPEDANCE STUDY N/A 09/22/2023   Procedure: IMPEDANCE PH STUDY, ESOPHAGUS;  Surgeon: Legrand Victory LITTIE DOUGLAS, MD;   Location: [facility];  Service: Gastroenterology;  Laterality: N/A;   UPPER GASTROINTESTINAL ENDOSCOPY     Patient Active Problem List   Diagnosis Date Noted   Foot lesion 09/03/2023   Headache 07/23/2022   Cervical disc disorder with radiculopathy of cervical region 06/20/2020   Seasonal allergic rhinitis 03/05/2020   Chronic left shoulder pain 11/27/2019   GERD (gastroesophageal reflux disease) 08/10/2019   Hypertension associated with diabetes (HCC) 08/10/2019   Insomnia 06/09/2019   PICC (peripherally inserted central catheter) in place 09/05/2018   Mixed diabetic hyperlipidemia associated with type 2 diabetes mellitus (HCC) 02/28/2018   Type 2 diabetes mellitus without complications (HCC) 02/25/2018   Tobacco use disorder 04/13/2017   T.T.P. syndrome (HCC) 02/06/2017   Thrombocytopenia (HCC) 02/01/2017    PCP: Toma Matas, MD  REFERRING PROVIDER: Trudy Duwaine BRAVO, NP  REFERRING DIAG: [phone] (ICD-10-CM) - Radiculopathy, cervical region M25.512,G89.29 (ICD-10-CM) - Chronic left shoulder pain  Rationale for Evaluation and Treatment: Rehabilitation  THERAPY DIAG:  Cervicalgia  Left shoulder pain, unspecified chronicity  Muscle weakness (generalized)  ONSET DATE: acute on chronic, exacerbation beginning ~3 months ago  SUBJECTIVE:  SUBJECTIVE STATEMENT: 11/24/2023 Patient reports that he has continued with PT exercises from previous PT, however, they are not helping.   EVAL: Pt endorses prior issues with L neck/shoulder that were improving, but about 3 months ago woke up with increased pain. States at one point he was supposed to get a shoulder surgery but was rescheduled d/t his TTP syndrome. He states this exacerbation is not really worsening since onset but is not improving, has  been trying to do exercises on his own and feel like they aggravate it. Less pain with rest, worsens with shoulder elevation and WB, lying on L side. Notes that he will sometimes get L shoulder/pec discomfort with abduction (not flexion) and sidelying/WB - states he has spoken w/ provider about it, denies any cardiac symptoms or red flags. No N/T, no headaches, no speech/swallowing issues, no vision issues. Does endorse difficulty picking up objects with L hand. States he is now requiring assistive devices for self care/hygiene tasks (I.e washing behind back)  We do have 2 referrals, one for back and one for neck/shoulder. He states he would prefer to focus on neck/shoulder as it affects him more, back pain still present but has improved. Mostly provoked by sitting and transfers after sitting.    PERTINENT HISTORY:  DM2, GERD, TTP syndrome, HTN, headaches, insomnia  PAIN:  Are you having pain: 8/10 Location/description: L neck/shoulder down to elbow, more anteriorly but sometimes posteriorly , throbbing Best-worst over past week: 5-10/10  - aggravating factors: raising arm, reaching behind back, lying on L side - Easing factors: hot water, stretching, icing, resting at side  PRECAUTIONS: TTP syndrome  RED FLAGS: None   WEIGHT BEARING RESTRICTIONS: No  FALLS:  Has patient fallen in last 6 months? No  LIVING ENVIRONMENT: 1 story home, 3 STE BIL rail Lives w/ fiancee, housework split  OCCUPATION: not working since TTP per pt report. Used to work for a company doing heavy lifting, Warden/ranger  PLOF: Independent  PATIENT GOALS: less pain, go back to work, be able to raise arm  NEXT MD VISIT: after PT per pt report   OBJECTIVE:  Note: Objective measures were completed at Evaluation unless otherwise noted.  DIAGNOSTIC FINDINGS:  No recent spine imaging in chart  PATIENT SURVEYS:  NDI: 34/50, 68%  COGNITION: Overall cognitive status: Within functional limits for tasks  assessed     SENSATION: LT intact BIL UE   POSTURE: FHP, guarded LUE posture, L UT elevation > R    CERVICAL ROM:    A/PROM  eval  Flexion 55 deg s  Extension 10 deg neck pain  Right lateral flexion 30 deg  Left lateral flexion 30 deg *  Right rotation 35 deg *  Left rotation 20 deg *   (Blank rows = not tested) (Key: WFL = within functional limits not formally assessed, * = concordant pain, s = stiffness/stretching sensation, NT = not tested)  Comments:     RANGE OF MOTION:      Right eval Left eval  Shoulder flexion WFL A: 124 deg *  Shoulder abduction  A: 96 deg *  Functional ER combo    Functional IR combo    Hip ER    Hip IR    Knee extension    Ankle dorsiflexion     (Blank rows = not tested) (Key: WFL = within functional limits not formally assessed, * = concordant pain, s = stiffness/stretching sensation, NT = not tested)  Comments:    STRENGTH  TESTING:  MMT Right eval Left eval  Shoulder flexion 5 4 **  Shoulder abduction 5 4*  Elbow flexion    Elbow extension    Grip strength  115# 60#  Hip flexion    Hip abduction (modified sitting)    Knee flexion    Knee extension    Ankle dorsiflexion    Ankle plantarflexion     (Blank rows = not tested) (Key: WFL = within functional limits not formally assessed, * = concordant pain, s = stiffness/stretching sensation, NT = not tested)  Comments:     FUNCTIONAL TESTS:  See grip strength and functional reaching above   TREATMENT DATE:    [facility] Adult PT Treatment:                                                DATE: 11/24/2023  Therapeutic Exercise: Seated pball rolling x 10 flexion, x10 left scaption  CS AROM flexion x 10 each  CS AROM side bending x 10 each Seated CS retraction 2 x 10 3 sec hold  Seated shrugs 2lb weight  Consider increasing weight at next visit  Grip strength 7lb, 9lb digi flex x 30 sec each Consider adding therabar activities with upper back support  Doorway pec stretch L  only 2 x 20 sec  Doorway pec stretch BIL, shoulder extended 2 x 30 sec  Created and reviewed initial HEP      PATIENT EDUCATION:  Education details: Pt education on PT impairments, prognosis, and POC. Informed consent. Rationale for interventions Person educated: Patient Education method: Explanation, Demonstration, Tactile cues, Verbal cues Education comprehension: verbalized understanding, returned demonstration, verbal cues required, tactile cues required, and needs further education    HOME EXERCISE PROGRAM: Access Code: [email or web address] URL: [email or web address] Date: 11/24/2023 Prepared by: Marko Molt  Exercises - Supine Cervical Rotation AROM on Pillow  - 1 x daily - 7 x weekly - 2-3 sets - 10 reps - Supine Cervical Retraction with Towel  - 1 x daily - 7 x weekly - 2-3 sets - 10 reps - 3 sec hold - Seated Scapular Retraction  - 1 x daily - 7 x weekly - 2-3 sets - 10 reps - 3 sec hold - Corner Pec Minor Stretch  - 1 x daily - 7 x weekly - 2-3 sets - 30 sec hold - Standing Pec Stretch at Wall  - 1 x daily - 7 x weekly - 2-3 sets - 30 sec hold  ASSESSMENT:  CLINICAL IMPRESSION:  11/24/2023 Eric Hooper had fair tolerance of initial treatment session. Tactile and Verbal cues required for instruction. He does report some increase in pain at end of session that is similar to the pain he normally feels with a lot of UE movement. Patient requires ongoing skilled PT intervention to address current impairments and related functional deficits. We will continue to progress as tolerated.    EVAL: Patient is a 48 y.o. gentleman who was seen today for physical therapy evaluation and treatment for neck/shoulder pain- also has referral for back but he states he would prefer to focus on the former as it is affecting daily activities more prominently. No red flags today - he is inquisitive re: some L anterior shoulder/pec pain, only provoked by WB through shoulder and abduction, denies any  cardiac symptoms or red flags. On exam he demonstrates concordant limitations in  shoulder/cervical mobility, GH/grip strength. Tolerates exam well overall, deferring HEP w/ time constraints. No adverse events. Recommend trial of skilled PT to address aforementioned deficits with aim of improving functional tolerance and reducing pain with typical activities. Pt departs today's session in no acute distress, all voiced concerns/questions addressed appropriately from PT perspective.    OBJECTIVE IMPAIRMENTS: decreased activity tolerance, decreased endurance, decreased mobility, decreased ROM, decreased strength, impaired perceived functional ability, impaired UE functional use, and pain.   ACTIVITY LIMITATIONS: carrying, lifting, sitting, sleeping, bathing, dressing, reach over head, and hygiene/grooming  PARTICIPATION LIMITATIONS: meal prep, cleaning, laundry, community activity, and occupation  PERSONAL FACTORS: Time since onset of injury/illness/exacerbation and 3+ comorbidities: DM2, GERD, TTP syndrome, HTN, headaches, insomnia are also affecting patient's functional outcome.   REHAB POTENTIAL: Good  CLINICAL DECISION MAKING: Evolving/moderate complexity  EVALUATION COMPLEXITY: Moderate   GOALS:   SHORT TERM GOALS: Target date: 12/07/2023  Pt will demonstrate appropriate understanding and performance of initially prescribed HEP in order to facilitate improved independence with management of symptoms.  Baseline: HEP TBD  Goal status: INITIAL   2. Pt will report at least 25% improvement in overall pain levels over past week in order to facilitate improved tolerance to typical daily activities.   Baseline: 5-10/10  Goal status: INITIAL     LONG TERM GOALS: Target date: 12/28/2023  Pt will score less than or equal to 20/50 (40%) on NDI in order to demonstrate improved perception of function due to symptoms ([facility]C 10-13 pts per Neysa dunker al 2009, 2010). Baseline: 34/50; 68% Goal status:  INITIAL  2. Pt will demonstrate at least 50 degrees of active cervical rotation ROM bilaterally in order to demonstrate improved environmental awareness and safety with driving.  Baseline: see ROM chart above Goal status: INITIAL  3. Pt will demonstrate at least 4+/5 shoulder flex/abd MMT BIL for improved symmetry of UE strength and improved tolerance to functional movements.  Baseline: see MMT chart above Goal status: INITIAL   4. Pt will report ability to perform usual self care/hygiene tasks with integration of BIL UE for improved functional tolerance.  Baseline: reports relying solely on RUE for washing back Goal status: INITIAL   5. Pt will report at least 50% decrease in overall pain levels in past week in order to facilitate improved tolerance to basic ADLs/mobility.   Baseline: 5-10/10  Goal status: INITIAL    PLAN:  PT FREQUENCY: 2x/week  PT DURATION: 6 weeks  PLANNED INTERVENTIONS: 97164- PT Re-evaluation, 97750- Physical Performance Testing, 97110-Therapeutic exercises, 97530- Therapeutic activity, 97112- Neuromuscular re-education, 97535- Self Care, [id number]- Manual therapy, Patient/Family education, Balance training, Taping, Joint mobilization, Spinal mobilization, Cryotherapy, and Moist heat.  PLAN FOR NEXT SESSION: establish HEP. Work on gentle GH/periscapular strengthening, GH mobility, grip strengthening. Symptom modification strategies as indicated/appropriate, mindful of TTP syndrome.   Marko Molt, PT, DPT  11/24/2023 9:20 AM

## 2023-11-25 ENCOUNTER — Ambulatory Visit: Admitting: Endocrinology

## 2023-11-25 ENCOUNTER — Other Ambulatory Visit: Payer: Self-pay | Admitting: Family Medicine

## 2023-11-25 ENCOUNTER — Telehealth: Payer: Self-pay

## 2023-11-25 ENCOUNTER — Ambulatory Visit: Payer: Self-pay

## 2023-11-25 ENCOUNTER — Encounter: Payer: Self-pay | Admitting: Endocrinology

## 2023-11-25 VITALS — BP 110/80 | HR 78 | Resp 16 | Ht 69.5 in | Wt 182.8 lb

## 2023-11-25 DIAGNOSIS — E119 Type 2 diabetes mellitus without complications: Secondary | ICD-10-CM

## 2023-11-25 DIAGNOSIS — Z7984 Long term (current) use of oral hypoglycemic drugs: Secondary | ICD-10-CM | POA: Diagnosis not present

## 2023-11-25 DIAGNOSIS — E1159 Type 2 diabetes mellitus with other circulatory complications: Secondary | ICD-10-CM

## 2023-11-25 NOTE — Telephone Encounter (Signed)
 Patient calls nurse line reporting he missed a call from our office.   He reports he received a VM from [place] to call the office back.   I do not see any notes, will forward to provider.

## 2023-11-25 NOTE — Progress Notes (Signed)
 Outpatient Endocrinology Note [place] Kwynn Schlotter, MD  11/25/23  Patient's Name: Eric Hooper    DOB: Jun 27, 1975    MRN: [id number]                                                    REASON OF VISIT: Follow-up of type 2 diabetes mellitus  PCP: Toma, Dimitry, MD  HISTORY OF PRESENT ILLNESS:   Eric Hooper is a 48 y.o. old male with past medical history listed below, is here for follow-up for type 2 diabetes mellitus.   Pertinent Diabetes History: _Diagnosed as Diabetes Mellitus type 2 in 2018.  Patient was initially treated with metformin .  Metformin  was switched to SGLT2 inhibitor in February 2024.  Patient reports he was also having sickness with metformin .  Patient had taken glipizide  and insulin  therapy during steroid treatment for his TTP in the past.  He has a relatively controlled type 2 diabetes mellitus.  Patient was referred to endocrinology for evaluation and management of known type 2 diabetes mellitus, initial consult was in November 2024.  History of DKA or diabetes related hospitalizations: none  Family h/o diabetes mellitus: father type 2 diabetes mellitus.    Chronic Diabetes Complications : Retinopathy: no. Last ophthalmology exam was done on annually, following with ophthalmology regularly.  Nephropathy: no, on ACE/ARB /losartan . Peripheral neuropathy: no Coronary artery disease: no Stroke: no  Relevant comorbidities and cardiovascular risk factors: Obesity: no Body mass index is 26.61 kg/m.  Hypertension: Yes  Hyperlipidemia : Yes, on statin   Current / Home Diabetic regimen includes:  Jardiance  10 mg daily.  Prior diabetic medications: Metformin  nausea and vomiting.  Was switched to Farxiga  in February 2024.  He had been on Farxiga  in the past.  He had been on insulin  therapy during his steroid treatment for his TTP.  Glycemic data:   Not able to download glucometer in the clinic today, blood sugar reviewed on the meter some of the blood sugar 89, 109,  112, 92, 89, 87.  Hypoglycemia: Patient has no hypoglycemic episodes. Patient has hypoglycemia awareness.  Factors modifying glucose control: 1.  Diabetic diet assessment: 3 meals a day.  He has made improvement on diet.  2.  Staying active or exercising: Physically active at work.  3.  Medication compliance: compliant all of the time.  Interval history  Hemoglobin A1c 5.9%.  He had tried Jardiance  25 mg daily however due to low blood sugar change to 10 mg daily.  Blood sugar mostly acceptable.  No more hypoglycemia.  No other complaints today.  No dizziness.  REVIEW OF SYSTEMS As per history of present illness.   PAST MEDICAL HISTORY: Past Medical History:  Diagnosis Date   At risk for dysfunction of heart 07/11/2018   Chest wall pain 08/14/2018   Controlled diabetes mellitus type 2 with complications (HCC) 02/25/2018   Diabetes mellitus (HCC) 02/01/2017   Diabetes mellitus without complication (HCC)    non-insulin  dependent   Diverticulosis 02/01/2017   on CT scan abd/pelvis   Eczema    GERD (gastroesophageal reflux disease)    Hypertension    Kidney stones 02/03/2017   Leg swelling 05/12/2017   Macrocytic anemia 02/01/2017   Microscopic hematuria 02/01/2017   Mixed hyperlipidemia 02/28/2018   Phlebitis 05/12/2017   Recurrent upper respiratory infection (URI)    Renal insufficiency 02/03/2017  Smoker 04/13/2017    PAST SURGICAL HISTORY: Past Surgical History:  Procedure Laterality Date   COLONOSCOPY     ELBOW SURGERY Right    ESOPHAGEAL MANOMETRY N/A 09/22/2023   Procedure: MANOMETRY, ESOPHAGUS;  Surgeon: Legrand Victory LITTIE DOUGLAS, MD;  Location: [facility];  Service: Gastroenterology;  Laterality: N/A;   IR FLUORO GUIDE CV LINE RIGHT  02/02/2017   IR FLUORO GUIDE CV LINE RIGHT  11/23/2021   IR FLUORO GUIDE CV LINE RIGHT  11/28/2021   IR REMOVAL TUN CV CATH W/O FL  02/26/2022   IR US  GUIDE VASC ACCESS RIGHT  02/02/2017   IR US  GUIDE VASC ACCESS RIGHT  11/23/2021    PH IMPEDANCE STUDY N/A 09/22/2023   Procedure: IMPEDANCE PH STUDY, ESOPHAGUS;  Surgeon: Legrand Victory LITTIE DOUGLAS, MD;  Location: [facility];  Service: Gastroenterology;  Laterality: N/A;   UPPER GASTROINTESTINAL ENDOSCOPY      ALLERGIES: Allergies  Allergen Reactions   Bee Venom Anaphylaxis   Ibuprofen Other (See Comments)    Drops platelets/ thrombocytopenia    FAMILY HISTORY:  Family History  Problem Relation Age of Onset   Allergic rhinitis Mother    Hypertension Mother    Hyperlipidemia Mother    Allergic rhinitis Father    Diabetes type II Father    Colon cancer Maternal Aunt    Esophageal cancer Neg Hx    Rectal cancer Neg Hx    Stomach cancer Neg Hx     SOCIAL HISTORY: Social History   Socioeconomic History   Marital status: Single    Spouse name: Not on file   Number of children: 2   Years of education: Not on file   Highest education level: 12th grade  Occupational History   Occupation: Location manager   Occupation: unemployed  Tobacco Use   Smoking status: Former    Current packs/day: 0.50    Average packs/day: 0.5 packs/day for 15.0 years (7.5 ttl pk-yrs)    Types: Cigarettes    Passive exposure: Current   Smokeless tobacco: Never  Vaping Use   Vaping status: Never Used  Substance and Sexual Activity   Alcohol use: Not Currently   Drug use: No   Sexual activity: Yes  Other Topics Concern   Not on file  Social History Narrative   ** Merged History Encounter **       Social Drivers of Health   Financial Resource Strain: Low Risk  (10/29/2023)   Overall Financial Resource Strain (CARDIA)    Difficulty of Paying Living Expenses: Not very hard  Food Insecurity: Food Insecurity Present (10/29/2023)   Hunger Vital Sign    Worried About Running Out of Food in the Last Year: Sometimes true    Ran Out of Food in the Last Year: Sometimes true  Transportation Needs: No Transportation Needs (10/29/2023)   PRAPARE - Administrator, Civil Service  (Medical): No    Lack of Transportation (Non-Medical): No  Physical Activity: Inactive (10/29/2023)   Exercise Vital Sign    Days of Exercise per Week: 0 days    Minutes of Exercise per Session: Not on file  Stress: Stress Concern Present (10/29/2023)   [employer] of Occupational Health - Occupational Stress Questionnaire    Feeling of Stress: Very much  Social Connections: Moderately Isolated (10/29/2023)   Social Connection and Isolation Panel    Frequency of Communication with Friends and Family: More than three times a week    Frequency of Social Gatherings with Friends  and Family: More than three times a week    Attends Religious Services: Never    Active Member of Clubs or Organizations: No    Attends Engineer, structural: Not on file    Marital Status: Living with partner    MEDICATIONS:  Current Outpatient Medications  Medication Sig Dispense Refill   acetaminophen  (TYLENOL ) 325 MG tablet Take 2 tablets (650 mg total) by mouth every 4 (four) hours as needed (discomfort or fever). 20 tablet 0   amLODipine  (NORVASC ) 10 MG tablet TAKE 1 TABLET BY MOUTH EVERYDAY AT BEDTIME 90 tablet 3   aspirin  EC 81 MG tablet Take 1 tablet (81 mg total) by mouth daily. Swallow whole.     atorvastatin  (LIPITOR) 40 MG tablet TAKE 1 TABLET BY MOUTH EVERY DAY 90 tablet 1   blood glucose meter kit and supplies Dispense based on patient and insurance preference. Use up to four times daily as directed. (FOR ICD-10 E10.9, E11.9). 1 each 0   cetirizine  (ZYRTEC ) 10 MG tablet Take 10 mg by mouth daily.     empagliflozin  (JARDIANCE ) 10 MG TABS tablet Take 1 tablet (10 mg total) by mouth daily. 90 tablet 3   EPINEPHrine  0.3 mg/0.3 mL IJ SOAJ injection Inject 0.3 mg into the muscle as needed for anaphylaxis. 1 each 0   eplerenone  (INSPRA ) 25 MG tablet TAKE 1 TABLET BY MOUTH EVERYDAY AT BEDTIME 90 tablet 0   esomeprazole  (NEXIUM ) 40 MG capsule TAKE 1 CAPSULE (40 MG TOTAL) BY MOUTH 2 (TWO) TIMES DAILY  BEFORE A MEAL. 180 capsule 1   famotidine  (PEPCID ) 20 MG tablet Take 1 tablet (20 mg total) by mouth daily before lunch. 90 tablet 1   lidocaine  (LIDODERM ) 5 % Place 1 patch onto the skin daily. Remove & Discard patch within 12 hours. Apply to lower back/tailbone 30 patch 0   losartan  (COZAAR ) 100 MG tablet TAKE 1 TABLET BY MOUTH EVERYDAY AT BEDTIME 90 tablet 1   montelukast  (SINGULAIR ) 10 MG tablet Take 1 tablet (10 mg total) by mouth at bedtime. 90 tablet 0   Multiple Vitamin (MULTIVITAMIN) capsule Take 1 capsule by mouth daily.     Olopatadine-Mometasone (RYALTRIS ) 665-25 MCG/ACT SUSP Place 2 sprays into the nose 2 (two) times daily as needed. 29 g 5   No current facility-administered medications for this visit.    PHYSICAL EXAM: Vitals:   11/25/23 1014  BP: 110/80  Pulse: 78  Resp: 16  SpO2: 97%  Weight: 182 lb 12.8 oz (82.9 kg)  Height: 5' 9.5 (1.765 m)    Body mass index is 26.61 kg/m.  Wt Readings from Last 3 Encounters:  11/25/23 182 lb 12.8 oz (82.9 kg)  11/22/23 181 lb 12.8 oz (82.5 kg)  11/12/23 184 lb 8 oz (83.7 kg)    General: Well developed, well nourished male in no apparent distress.  HEENT: AT/[place], no external lesions.  Eyes: Conjunctiva clear and no icterus. Neck: Neck supple  Lungs: Respirations not labored Neurologic: Alert, oriented, normal speech Extremities / Skin: Dry.   Psychiatric: Does not appear depressed or anxious  Diabetic Foot Exam - Simple   No data filed     LABS Reviewed Lab Results  Component Value Date   HGBA1C 5.9 (H) 11/22/2023   HGBA1C 6.5 07/15/2023   HGBA1C 6.9 (H) 06/28/2023   No results found for: FRUCTOSAMINE Lab Results  Component Value Date   CHOL 94 (L) 05/29/2022   HDL 42 05/29/2022   LDLCALC 39 05/29/2022  TRIG 53 05/29/2022   CHOLHDL 2.2 05/29/2022   Lab Results  Component Value Date   MICRALBCREAT 18 06/28/2023   MICRALBCREAT 13 05/29/2022   Lab Results  Component Value Date   CREATININE 1.03  11/22/2023   No results found for: GFR  ASSESSMENT / PLAN  1. Type 2 diabetes mellitus without complication, without long-term current use of insulin  (HCC)     Diabetes Mellitus type 2, complicated by no known complications. - Diabetic status / severity: Controlled.  Lab Results  Component Value Date   HGBA1C 5.9 (H) 11/22/2023    - Hemoglobin A1c goal : <6.5%  - Medications: See below  I) continue Jardiance  10 mg daily.  - Home glucose testing: Few times a week at different times of the day. - Discussed/ Gave Hypoglycemia treatment plan.  # Consult : Not required at this time.  # Annual urine for microalbuminuria/ creatinine ratio, no microalbuminuria currently, continue ACE/ARB /losartan . Last  Lab Results  Component Value Date   MICRALBCREAT 18 06/28/2023    # Foot check nightly.  # Annual dilated diabetic eye exams.   - Diet: Make healthy diabetic food choices - Life style / activity / exercise: Discussed.  2. Blood pressure  -  BP Readings from Last 1 Encounters:  11/25/23 110/80    - Control is in target.  - No change in current plans.    3. Lipid status / Hyperlipidemia - Last  Lab Results  Component Value Date   LDLCALC 39 05/29/2022   - Continue atorvastatin  40 mg daily.  Managed by primary care provider.  Diagnoses and all orders for this visit:  Type 2 diabetes mellitus without complication, without long-term current use of insulin  (HCC)   DISPOSITION Follow up in clinic in 6  months suggested.   All questions answered and patient verbalized understanding of the plan.  Iraq Embry Huss, MD [facility] Endocrinology [facility] [address] [place], [address] [place], [place] [address] Phone # [phone]  At least part of this note was generated using voice recognition software. Inadvertent word errors may have occurred, which were not recognized during the proofreading process.

## 2023-11-26 ENCOUNTER — Other Ambulatory Visit: Payer: Self-pay

## 2023-11-27 NOTE — Telephone Encounter (Signed)
 Called patient on 11/24/23 regarding his elevated Bilirubin w/o answer. I left a voicemail recommending the patient to come back next week for Hepatic panel lab and f/u if he experiencing any abdominal pain.

## 2023-11-29 NOTE — Telephone Encounter (Signed)
 Patient scheduled for followup lab work tomorrow morning.

## 2023-11-30 ENCOUNTER — Encounter: Payer: Self-pay | Admitting: Allergy & Immunology

## 2023-11-30 ENCOUNTER — Other Ambulatory Visit

## 2023-11-30 ENCOUNTER — Other Ambulatory Visit: Payer: Self-pay

## 2023-11-30 ENCOUNTER — Ambulatory Visit (INDEPENDENT_AMBULATORY_CARE_PROVIDER_SITE_OTHER): Admitting: Allergy & Immunology

## 2023-11-30 VITALS — BP 130/72 | HR 75 | Temp 97.8°F | Resp 18 | Ht 68.5 in | Wt 181.9 lb

## 2023-11-30 DIAGNOSIS — R17 Unspecified jaundice: Secondary | ICD-10-CM | POA: Diagnosis not present

## 2023-11-30 DIAGNOSIS — J302 Other seasonal allergic rhinitis: Secondary | ICD-10-CM | POA: Diagnosis not present

## 2023-11-30 DIAGNOSIS — J3089 Other allergic rhinitis: Secondary | ICD-10-CM | POA: Diagnosis not present

## 2023-11-30 DIAGNOSIS — J3489 Other specified disorders of nose and nasal sinuses: Secondary | ICD-10-CM

## 2023-11-30 DIAGNOSIS — J310 Chronic rhinitis: Secondary | ICD-10-CM

## 2023-11-30 NOTE — Patient Instructions (Addendum)
 1. Chronic rhinitis - Testing today at the last visit showed: grasses, ragweed, weeds, and dog - Continue with: Singulair  (montelukast ) 10mg  daily - Continue with: Ryaltris  (olopatadine/mometasone) two sprays per nostril 1-2 times daily as needed (AIM FOR THE EARS to avoid the nose bleeds) - You can use an extra dose of the antihistamine, if needed, for breakthrough symptoms.  - Consider nasal saline rinses 1-2 times daily to remove allergens from the nasal cavities as well as help with mucous clearance (this is especially helpful to do before the nasal sprays are given) - Allergy  shots discussed, although I am not sure that this would work since you are on rituximab  right now.  - We are getting a sinus CT to look into more detailed pictures of your sinus cavities.   2. Folliculitis/acne  - Continue with the clindamycin  lotion.  3. GERD - Continue Nexium  daily.   4. Return in about 6 months (around 06/01/2024). You can have the follow up appointment with Dr. Iva or a Nurse Practicioner (our Nurse Practitioners are excellent and always have Physician oversight!).    Please inform us  of any Emergency Department visits, hospitalizations, or changes in symptoms. Call us  before going to the ED for breathing or allergy  symptoms since we might be able to fit you in for a sick visit. Feel free to contact us  anytime with any questions, problems, or concerns.  It was a pleasure to meet you and your family today!  Websites that have reliable patient information: 1. American Academy of Asthma, Allergy , and Immunology: www.aaaai.org 2. Food Allergy  Research and Education (FARE): foodallergy.org 3. Mothers of Asthmatics: http://www.asthmacommunitynetwork.org 4. [employer] of Allergy , Asthma, and Immunology: www.acaai.org      "Like" us  on Facebook and Instagram for our latest updates!      A healthy democracy works best when [employer] participate! Make sure you are registered to  vote! If you have moved or changed any of your contact information, you will need to get this updated before voting! Scan the QR codes below to learn more!     Allergy  Shots  Allergies are the result of a chain reaction that starts in the immune system. Your immune system controls how your body defends itself. For instance, if you have an allergy  to pollen, your immune system identifies pollen as an invader or allergen. Your immune system overreacts by producing antibodies called Immunoglobulin E (IgE). These antibodies travel to cells that release chemicals, causing an allergic reaction.  The concept behind allergy  immunotherapy, whether it is received in the form of shots or tablets, is that the immune system can be desensitized to specific allergens that trigger allergy  symptoms. Although it requires time and patience, the payback can be long-term relief. Allergy  injections contain a dilute solution of those substances that you are allergic to based upon your skin testing and allergy  history.   How Do Allergy  Shots Work?  Allergy  shots work much like a vaccine. Your body responds to injected amounts of a particular allergen given in increasing doses, eventually developing a resistance and tolerance to it. Allergy  shots can lead to decreased, minimal or no allergy  symptoms.  There generally are two phases: build-up and maintenance. Build-up often ranges from three to six months and involves receiving injections with increasing amounts of the allergens. The shots are typically given once or twice a week, though more rapid build-up schedules are sometimes used.  The maintenance phase begins when the most effective dose is reached. This dose is  different for each person, depending on how allergic you are and your response to the build-up injections. Once the maintenance dose is reached, there are longer periods between injections, typically two to four weeks.  Occasionally doctors give  cortisone-type shots that can temporarily reduce allergy  symptoms. These types of shots are different and should not be confused with allergy  immunotherapy shots.  Who Can Be Treated with Allergy  Shots?  Allergy  shots may be a good treatment approach for people with allergic rhinitis (hay fever), allergic asthma, conjunctivitis (eye allergy ) or stinging insect allergy .   Before deciding to begin allergy  shots, you should consider:   The length of allergy  season and the severity of your symptoms  Whether medications and/or changes to your environment can control your symptoms  Your desire to avoid long-term medication use  Time: allergy  immunotherapy requires a major time commitment  Cost: may vary depending on your insurance coverage  Allergy  shots for children age 76 and older are effective and often well tolerated. They might prevent the onset of new allergen sensitivities or the progression to asthma.  Allergy  shots are not started on patients who are pregnant but can be continued on patients who become pregnant while receiving them. In some patients with other medical conditions or who take certain common medications, allergy  shots may be of risk. It is important to mention other medications you talk to your allergist.   What are the two types of build-ups offered:   RUSH or Rapid Desensitization -- one day of injections lasting from 8:30-4:30pm, injections every 1 hour.  Approximately half of the build-up process is completed in that one day.  The following week, normal build-up is resumed, and this entails ~16 visits either weekly or twice weekly, until reaching your "maintenance dose" which is continued weekly until eventually getting spaced out to every month for a duration of 3 to 5 years. The regular build-up appointments are nurse visits where the injections are administered, followed by required monitoring for 30 minutes.    Traditional build-up -- weekly visits for 6 -12 months  until reaching "maintenance dose", then continue weekly until eventually spacing out to every 4 weeks as above. At these appointments, the injections are administered, followed by required monitoring for 30 minutes.     Either way is acceptable, and both are equally effective. With the rush protocol, the advantage is that less time is spent here for injections overall AND you would also reach maintenance dosing faster (which is when the clinical benefit starts to become more apparent). Not everyone is a candidate for rapid desensitization.   IF we proceed with the RUSH protocol, there are premedications which must be taken the day before and the day after the rush only (this includes antihistamines, steroids, and Singulair ).  After the rush day, no prednisone  or Singulair  is required, and we just recommend antihistamines taken on your injection day.  What Is An Estimate of the Costs?  If you are interested in starting allergy  injections, please check with your insurance company about your coverage for both allergy  vial sets and allergy  injections.  Please do so prior to making the appointment to start injections.  The following are CPT codes to give to your insurance company. These are the amounts we BILL to the insurance company, but the amount YOU WILL PAY and WE RECEIVE IS SUBSTANTIALLY LESS and depends on the contracts we have with different insurance companies.   Amount Billed to Insurance One allergy  vial set  CPT 95165   $  1200     Two allergy  vial set  CPT 95165   $ 2400     Three allergy  vial set  CPT 95165   $ 3600     One injection   CPT 95115   $ 35  Two injections   CPT 95117   $ 40 RUSH (Rapid Desensitization) CPT 95180 x 8 hours $500/hour  Regarding the allergy  injections, your co-pay may or may not apply with each injection, so please confirm this with your insurance company. When you start allergy  injections, 1 or 2 sets of vials are made based on your allergies.  Not all patients  can be on one set of vials. A set of vials lasts 6 months to a year depending on how quickly you can proceed with your build-up of your allergy  injections. Vials are personalized for each patient depending on their specific allergens.  How often are allergy  injection given during the build-up period?   Injections are given at least weekly during the build-up period until your maintenance dose is achieved. Per the doctor's discretion, you may have the option of getting allergy  injections two times per week during the build-up period. However, there must be at least 48 hours between injections. The build-up period is usually completed within 6-12 months depending on your ability to schedule injections and for adjustments for reactions. When maintenance dose is reached, your injection schedule is gradually changed to every two weeks and later to every three weeks. Injections will then continue every 4 weeks. Usually, injections are continued for a total of 3-5 years.   When Will I Feel Better?  Some may experience decreased allergy  symptoms during the build-up phase. For others, it may take as long as 12 months on the maintenance dose. If there is no improvement after a year of maintenance, your allergist will discuss other treatment options with you.  If you aren't responding to allergy  shots, it may be because there is not enough dose of the allergen in your vaccine or there are missing allergens that were not identified during your allergy  testing. Other reasons could be that there are high levels of the allergen in your environment or major exposure to non-allergic triggers like tobacco smoke.  What Is the Length of Treatment?  Once the maintenance dose is reached, allergy  shots are generally continued for three to five years. The decision to stop should be discussed with your allergist at that time. Some people may experience a permanent reduction of allergy  symptoms. Others may relapse and a longer  course of allergy  shots can be considered.  What Are the Possible Reactions?  The two types of adverse reactions that can occur with allergy  shots are local and systemic. Common local reactions include very mild redness and swelling at the injection site, which can happen immediately or several hours after. Report a delayed reaction from your last injection. These include arm swelling or runny nose, watery eyes or cough that occurs within 12-24 hours after injection. A systemic reaction, which is less common, affects the entire body or a particular body system. They are usually mild and typically respond quickly to medications. Signs include increased allergy  symptoms such as sneezing, a stuffy nose or hives.   Rarely, a serious systemic reaction called anaphylaxis can develop. Symptoms include swelling in the throat, wheezing, a feeling of tightness in the chest, nausea or dizziness. Most serious systemic reactions develop within 30 minutes of allergy  shots. This is why it is strongly recommended you wait  in your doctor's office for 30 minutes after your injections. Your allergist is trained to watch for reactions, and his or her staff is trained and equipped with the proper medications to identify and treat them.   Report to the nurse immediately if you experience any of the following symptoms: swelling, itching or redness of the skin, hives, watery eyes/nose, breathing difficulty, excessive sneezing, coughing, stomach pain, diarrhea, or light headedness. These symptoms may occur within 15-20 minutes after injection and may require medication.   Who Should Administer Allergy  Shots?  The preferred location for receiving shots is your prescribing allergist's office. Injections can sometimes be given at another facility where the physician and staff are trained to recognize and treat reactions, and have received instructions by your prescribing allergist.  What if I am late for an injection?    Injection dose will be adjusted depending upon how many days or weeks you are late for your injection.   What if I am sick?   Please report any illness to the nurse before receiving injections. She may adjust your dose or postpone injections depending on your symptoms. If you have fever, flu, sinus infection or chest congestion it is best to postpone allergy  injections until you are better. Never get an allergy  injection if your asthma is causing you problems. If your symptoms persist, seek out medical care to get your health problem under control.  What If I am or Become Pregnant:  Women that become pregnant should schedule an appointment with [facility] before receiving any further allergy  injections.

## 2023-11-30 NOTE — Progress Notes (Unsigned)
 FOLLOW UP  Date of Service/Encounter:  11/30/23   Assessment:   Chronic rhinitis   Folliculitis   GERD - on Nexium    Previously on allergy  shots when he lived in [place]     Complicated past medical history, including TTP treated with prednisone  and rituximab   Plan/Recommendations:   There are no Patient Instructions on file for this visit.   Subjective:   Eric Hooper is a 48 y.o. male presenting today for follow up of No chief complaint on file.   Eric Hooper has a history of the following: Patient Active Problem List   Diagnosis Date Noted   Foot lesion 09/03/2023   Headache 07/23/2022   Cervical disc disorder with radiculopathy of cervical region 06/20/2020   Seasonal allergic rhinitis 03/05/2020   Chronic left shoulder pain 11/27/2019   GERD (gastroesophageal reflux disease) 08/10/2019   Hypertension associated with diabetes (HCC) 08/10/2019   Insomnia 06/09/2019   PICC (peripherally inserted central catheter) in place 09/05/2018   Mixed diabetic hyperlipidemia associated with type 2 diabetes mellitus (HCC) 02/28/2018   Type 2 diabetes mellitus without complications (HCC) 02/25/2018   Tobacco use disorder 04/13/2017   T.T.P. syndrome (HCC) 02/06/2017   Thrombocytopenia (HCC) 02/01/2017    History obtained from: chart review and {Persons; PED relatives w/patient:19415::patient}.  Discussed the use of AI scribe software for clinical note transcription with the patient and/or guardian, who gave verbal consent to proceed.  Eric Hooper is a 48 y.o. male presenting for {Blank single:19197::a food challenge,a drug challenge,skin testing,a sick visit,an evaluation of ***,a follow up visit}.  He was last seen in May 2025.  At that time, testing was positive to pollens as well as dog.  We stopped his current no spray and continue with Singulair .  We started Ryaltris .  For his folliculitis, he continue with the clindamycin .  For his GERD, he continued on  Nexium .    Since last visit,    Asthma/Respiratory Symptom History: ***  Allergic Rhinitis Symptom History: ***  Food Allergy  Symptom History: ***  Skin Symptom History: ***  GERD Symptom History: ***  Infection Symptom History: ***  Otherwise, there have been no changes to his past medical history, surgical history, family history, or social history.    Review of systems otherwise negative other than that mentioned in the HPI.    Objective:   There were no vitals taken for this visit. There is no height or weight on file to calculate BMI.    Physical Exam   Diagnostic studies: {Blank single:19197::none,deferred due to recent antihistamine use,deferred due to insurance stipulations that require a separate visit for testing,labs sent instead, }  Spirometry: {Blank single:19197::results normal (FEV1: ***%, FVC: ***%, FEV1/FVC: ***%),results abnormal (FEV1: ***%, FVC: ***%, FEV1/FVC: ***%)}.    {Blank single:19197::Spirometry consistent with mild obstructive disease,Spirometry consistent with moderate obstructive disease,Spirometry consistent with severe obstructive disease,Spirometry consistent with possible restrictive disease,Spirometry consistent with mixed obstructive and restrictive disease,Spirometry uninterpretable due to technique,Spirometry consistent with normal pattern}. {Blank single:19197::Albuterol /Atrovent nebulizer,Xopenex/Atrovent nebulizer,Albuterol  nebulizer,Albuterol  four puffs via MDI,Xopenex four puffs via MDI} treatment given in clinic with {Blank single:19197::significant improvement in FEV1 per ATS criteria,significant improvement in FVC per ATS criteria,significant improvement in FEV1 and FVC per ATS criteria,improvement in FEV1, but not significant per ATS criteria,improvement in FVC, but not significant per ATS criteria,improvement in FEV1 and FVC, but not significant per ATS criteria,no  improvement}.  Allergy  Studies: {Blank single:19197::none,deferred due to recent antihistamine use,deferred due to insurance stipulations that require a separate visit for testing,labs sent  instead, }    {Blank single:19197::Allergy  testing results were read and interpreted by myself, documented by clinical staff., }      Marty Shaggy, MD  [facility] 

## 2023-11-30 NOTE — Progress Notes (Unsigned)
 Labs required for elevate T-bilirubin during last visit.

## 2023-12-01 ENCOUNTER — Other Ambulatory Visit: Payer: Self-pay

## 2023-12-01 ENCOUNTER — Encounter: Payer: Self-pay | Admitting: Allergy & Immunology

## 2023-12-01 ENCOUNTER — Ambulatory Visit: Attending: Physical Medicine and Rehabilitation

## 2023-12-01 DIAGNOSIS — M5416 Radiculopathy, lumbar region: Secondary | ICD-10-CM | POA: Insufficient documentation

## 2023-12-01 DIAGNOSIS — M6281 Muscle weakness (generalized): Secondary | ICD-10-CM | POA: Insufficient documentation

## 2023-12-01 DIAGNOSIS — M25512 Pain in left shoulder: Secondary | ICD-10-CM | POA: Insufficient documentation

## 2023-12-01 DIAGNOSIS — M542 Cervicalgia: Secondary | ICD-10-CM | POA: Insufficient documentation

## 2023-12-01 LAB — HEPATIC FUNCTION PANEL
ALT: 28 IU/L (ref 0–44)
AST: 17 IU/L (ref 0–40)
Albumin: 4.4 g/dL (ref 4.1–5.1)
Alkaline Phosphatase: 151 IU/L — ABNORMAL HIGH (ref 44–121)
Bilirubin Total: 1.1 mg/dL (ref 0.0–1.2)
Bilirubin, Direct: 0.35 mg/dL (ref 0.00–0.40)
Total Protein: 6.1 g/dL (ref 6.0–8.5)

## 2023-12-01 NOTE — Therapy (Signed)
 OUTPATIENT PHYSICAL THERAPY TREATMENT NOTE   Patient Name: Eric Hooper MRN: [id number] DOB:[birth year], 48 y.o., male Today's Date: 12/01/2023  END OF SESSION:  PT End of Session - 12/01/23 0955     Visit Number 3    Number of Visits 13    Date for PT Re-Evaluation 12/28/23    Authorization Type [employer] [facility]    Authorization Time Period auth after 27 visits per appt notes    PT Start Time 1000    PT Stop Time 1040    PT Time Calculation (min) 40 min    Activity Tolerance Patient tolerated treatment well;Patient limited by pain    Behavior During Therapy [facility] for tasks assessed/performed         Past Medical History:  Diagnosis Date   At risk for dysfunction of heart 07/11/2018   Chest wall pain 08/14/2018   Controlled diabetes mellitus type 2 with complications (HCC) 02/25/2018   Diabetes mellitus (HCC) 02/01/2017   Diabetes mellitus without complication (HCC)    non-insulin  dependent   Diverticulosis 02/01/2017   on CT scan abd/pelvis   Eczema    GERD (gastroesophageal reflux disease)    Hypertension    Kidney stones 02/03/2017   Leg swelling 05/12/2017   Macrocytic anemia 02/01/2017   Microscopic hematuria 02/01/2017   Mixed hyperlipidemia 02/28/2018   Phlebitis 05/12/2017   Recurrent upper respiratory infection (URI)    Renal insufficiency 02/03/2017   Smoker 04/13/2017   Past Surgical History:  Procedure Laterality Date   COLONOSCOPY     ELBOW SURGERY Right    ESOPHAGEAL MANOMETRY N/A 09/22/2023   Procedure: MANOMETRY, ESOPHAGUS;  Surgeon: Legrand Victory LITTIE DOUGLAS, MD;  Location: [facility];  Service: Gastroenterology;  Laterality: N/A;   IR FLUORO GUIDE CV LINE RIGHT  02/02/2017   IR FLUORO GUIDE CV LINE RIGHT  11/23/2021   IR FLUORO GUIDE CV LINE RIGHT  11/28/2021   IR REMOVAL TUN CV CATH W/O FL  02/26/2022   IR US  GUIDE VASC ACCESS RIGHT  02/02/2017   IR US  GUIDE VASC ACCESS RIGHT  11/23/2021   PH IMPEDANCE STUDY N/A 09/22/2023   Procedure:  IMPEDANCE PH STUDY, ESOPHAGUS;  Surgeon: Legrand Victory LITTIE DOUGLAS, MD;  Location: [facility];  Service: Gastroenterology;  Laterality: N/A;   UPPER GASTROINTESTINAL ENDOSCOPY     Patient Active Problem List   Diagnosis Date Noted   Foot lesion 09/03/2023   Headache 07/23/2022   Cervical disc disorder with radiculopathy of cervical region 06/20/2020   Seasonal allergic rhinitis 03/05/2020   Chronic left shoulder pain 11/27/2019   GERD (gastroesophageal reflux disease) 08/10/2019   Hypertension associated with diabetes (HCC) 08/10/2019   Insomnia 06/09/2019   PICC (peripherally inserted central catheter) in place 09/05/2018   Mixed diabetic hyperlipidemia associated with type 2 diabetes mellitus (HCC) 02/28/2018   Type 2 diabetes mellitus without complications (HCC) 02/25/2018   Tobacco use disorder 04/13/2017   T.T.P. syndrome (HCC) 02/06/2017   Thrombocytopenia (HCC) 02/01/2017    PCP: Toma Matas, MD  REFERRING PROVIDER: Trudy Duwaine BRAVO, NP  REFERRING DIAG: [phone] (ICD-10-CM) - Radiculopathy, cervical region M25.512,G89.29 (ICD-10-CM) - Chronic left shoulder pain  Rationale for Evaluation and Treatment: Rehabilitation  THERAPY DIAG:  Cervicalgia  Left shoulder pain, unspecified chronicity  Muscle weakness (generalized)  ONSET DATE: acute on chronic, exacerbation beginning ~3 months ago  SUBJECTIVE:  SUBJECTIVE STATEMENT: Patient reports continued soreness and compliance with HEP. He states the only time he doesn't have pain is when he doesn't move the arm. He is having trouble sleeping due to the pain.   EVAL: Pt endorses prior issues with L neck/shoulder that were improving, but about 3 months ago woke up with increased pain. States at one point he was supposed to get a shoulder  surgery but was rescheduled d/t his TTP syndrome. He states this exacerbation is not really worsening since onset but is not improving, has been trying to do exercises on his own and feel like they aggravate it. Less pain with rest, worsens with shoulder elevation and WB, lying on L side. Notes that he will sometimes get L shoulder/pec discomfort with abduction (not flexion) and sidelying/WB - states he has spoken w/ provider about it, denies any cardiac symptoms or red flags. No N/T, no headaches, no speech/swallowing issues, no vision issues. Does endorse difficulty picking up objects with L hand. States he is now requiring assistive devices for self care/hygiene tasks (I.e washing behind back)  We do have 2 referrals, one for back and one for neck/shoulder. He states he would prefer to focus on neck/shoulder as it affects him more, back pain still present but has improved. Mostly provoked by sitting and transfers after sitting.    PERTINENT HISTORY:  DM2, GERD, TTP syndrome, HTN, headaches, insomnia  PAIN:  Are you having pain: 8/10 Location/description: L neck/shoulder down to elbow, more anteriorly but sometimes posteriorly , throbbing Best-worst over past week: 5-10/10  - aggravating factors: raising arm, reaching behind back, lying on L side - Easing factors: hot water, stretching, icing, resting at side  PRECAUTIONS: TTP syndrome  RED FLAGS: None   WEIGHT BEARING RESTRICTIONS: No  FALLS:  Has patient fallen in last 6 months? No  LIVING ENVIRONMENT: 1 story home, 3 STE BIL rail Lives w/ fiancee, housework split  OCCUPATION: not working since TTP per pt report. Used to work for a company doing heavy lifting, Warden/ranger  PLOF: Independent  PATIENT GOALS: less pain, go back to work, be able to raise arm  NEXT MD VISIT: after PT per pt report   OBJECTIVE:  Note: Objective measures were completed at Evaluation unless otherwise noted.  DIAGNOSTIC FINDINGS:  No recent  spine imaging in chart  PATIENT SURVEYS:  NDI: 34/50, 68%  COGNITION: Overall cognitive status: Within functional limits for tasks assessed     SENSATION: LT intact BIL UE   POSTURE: FHP, guarded LUE posture, L UT elevation > R    CERVICAL ROM:    A/PROM  eval  Flexion 55 deg s  Extension 10 deg neck pain  Right lateral flexion 30 deg  Left lateral flexion 30 deg *  Right rotation 35 deg *  Left rotation 20 deg *   (Blank rows = not tested) (Key: WFL = within functional limits not formally assessed, * = concordant pain, s = stiffness/stretching sensation, NT = not tested)  Comments:     RANGE OF MOTION:      Right eval Left eval  Shoulder flexion WFL A: 124 deg *  Shoulder abduction  A: 96 deg *  Functional ER combo    Functional IR combo    Hip ER    Hip IR    Knee extension    Ankle dorsiflexion     (Blank rows = not tested) (Key: WFL = within functional limits not formally assessed, * = concordant  pain, s = stiffness/stretching sensation, NT = not tested)  Comments:    STRENGTH TESTING:  MMT Right eval Left eval  Shoulder flexion 5 4 **  Shoulder abduction 5 4*  Elbow flexion    Elbow extension    Grip strength  115# 60#  Hip flexion    Hip abduction (modified sitting)    Knee flexion    Knee extension    Ankle dorsiflexion    Ankle plantarflexion     (Blank rows = not tested) (Key: WFL = within functional limits not formally assessed, * = concordant pain, s = stiffness/stretching sensation, NT = not tested)  Comments:     FUNCTIONAL TESTS:  See grip strength and functional reaching above   TREATMENT DATE:  [facility] Adult PT Treatment:                                                DATE: 12/01/23 Therapeutic Exercise (performed in chair for support): Therabar yellow twisting, smiles/frowns 2x30 ea CS AROM flexion x 10 each  CS AROM side bending x 10 each Seated CS retraction 2 x 10 3 sec hold  Seated shrugs 4lb weight 2x10 Grip strength  7lb, 9lb digi flex x 30 sec each Manual Therapy: STM Lt upper trap, cervical paraspinals SO release (painful) Positional release Lt upper trap  [facility] Adult PT Treatment:                                                DATE: 11/24/2023  Therapeutic Exercise: Seated pball rolling x 10 flexion, x10 left scaption  CS AROM flexion x 10 each  CS AROM side bending x 10 each Seated CS retraction 2 x 10 3 sec hold  Seated shrugs 2lb weight  Consider increasing weight at next visit  Grip strength 7lb, 9lb digi flex x 30 sec each Consider adding therabar activities with upper back support  Doorway pec stretch L only 2 x 20 sec  Doorway pec stretch BIL, shoulder extended 2 x 30 sec  Created and reviewed initial HEP     PATIENT EDUCATION:  Education details: Pt education on PT impairments, prognosis, and POC. Informed consent. Rationale for interventions Person educated: Patient Education method: Explanation, Demonstration, Tactile cues, Verbal cues Education comprehension: verbalized understanding, returned demonstration, verbal cues required, tactile cues required, and needs further education    HOME EXERCISE PROGRAM: Access Code: [email or web address] URL: [email or web address] Date: 11/24/2023 Prepared by: Marko Molt  Exercises - Supine Cervical Rotation AROM on Pillow  - 1 x daily - 7 x weekly - 2-3 sets - 10 reps - Supine Cervical Retraction with Towel  - 1 x daily - 7 x weekly - 2-3 sets - 10 reps - 3 sec hold - Seated Scapular Retraction  - 1 x daily - 7 x weekly - 2-3 sets - 10 reps - 3 sec hold - Corner Pec Minor Stretch  - 1 x daily - 7 x weekly - 2-3 sets - 30 sec hold - Standing Pec Stretch at Wall  - 1 x daily - 7 x weekly - 2-3 sets - 30 sec hold  ASSESSMENT:  CLINICAL IMPRESSION: Patient presents to PT reporting continued pain in his neck and shoulder and that  he has been compliant with his HEP so far. Session today continued to focused on UE strengthening,  stretching, and manual techniques to decrease tension and improve pain. He reports that his neck felt looser after manual. Patient was able to tolerate all prescribed exercises with no adverse effects. Patient continues to benefit from skilled PT services and should be progressed as able to improve functional independence.   EVAL: Patient is a 48 y.o. gentleman who was seen today for physical therapy evaluation and treatment for neck/shoulder pain- also has referral for back but he states he would prefer to focus on the former as it is affecting daily activities more prominently. No red flags today - he is inquisitive re: some L anterior shoulder/pec pain, only provoked by WB through shoulder and abduction, denies any cardiac symptoms or red flags. On exam he demonstrates concordant limitations in shoulder/cervical mobility, GH/grip strength. Tolerates exam well overall, deferring HEP w/ time constraints. No adverse events. Recommend trial of skilled PT to address aforementioned deficits with aim of improving functional tolerance and reducing pain with typical activities. Pt departs today's session in no acute distress, all voiced concerns/questions addressed appropriately from PT perspective.    OBJECTIVE IMPAIRMENTS: decreased activity tolerance, decreased endurance, decreased mobility, decreased ROM, decreased strength, impaired perceived functional ability, impaired UE functional use, and pain.   ACTIVITY LIMITATIONS: carrying, lifting, sitting, sleeping, bathing, dressing, reach over head, and hygiene/grooming  PARTICIPATION LIMITATIONS: meal prep, cleaning, laundry, community activity, and occupation  PERSONAL FACTORS: Time since onset of injury/illness/exacerbation and 3+ comorbidities: DM2, GERD, TTP syndrome, HTN, headaches, insomnia are also affecting patient's functional outcome.   REHAB POTENTIAL: Good  CLINICAL DECISION MAKING: Evolving/moderate complexity  EVALUATION COMPLEXITY:  Moderate   GOALS:   SHORT TERM GOALS: Target date: 12/07/2023  Pt will demonstrate appropriate understanding and performance of initially prescribed HEP in order to facilitate improved independence with management of symptoms.  Baseline: HEP TBD  Goal status: INITIAL   2. Pt will report at least 25% improvement in overall pain levels over past week in order to facilitate improved tolerance to typical daily activities.   Baseline: 5-10/10  Goal status: INITIAL     LONG TERM GOALS: Target date: 12/28/2023  Pt will score less than or equal to 20/50 (40%) on NDI in order to demonstrate improved perception of function due to symptoms (MDC 10-13 pts per Neysa dunker al 2009, 2010). Baseline: 34/50; 68% Goal status: INITIAL  2. Pt will demonstrate at least 50 degrees of active cervical rotation ROM bilaterally in order to demonstrate improved environmental awareness and safety with driving.  Baseline: see ROM chart above Goal status: INITIAL  3. Pt will demonstrate at least 4+/5 shoulder flex/abd MMT BIL for improved symmetry of UE strength and improved tolerance to functional movements.  Baseline: see MMT chart above Goal status: INITIAL   4. Pt will report ability to perform usual self care/hygiene tasks with integration of BIL UE for improved functional tolerance.  Baseline: reports relying solely on RUE for washing back Goal status: INITIAL   5. Pt will report at least 50% decrease in overall pain levels in past week in order to facilitate improved tolerance to basic ADLs/mobility.   Baseline: 5-10/10  Goal status: INITIAL    PLAN:  PT FREQUENCY: 2x/week  PT DURATION: 6 weeks  PLANNED INTERVENTIONS: 97164- PT Re-evaluation, 97750- Physical Performance Testing, 97110-Therapeutic exercises, 97530- Therapeutic activity, [id number]- Neuromuscular re-education, 97535- Self Care, [id number]- Manual therapy, Patient/Family education, Balance training, Taping, Joint mobilization,  Spinal  mobilization, Cryotherapy, and Moist heat.  PLAN FOR NEXT SESSION: establish HEP. Work on gentle GH/periscapular strengthening, GH mobility, grip strengthening. Symptom modification strategies as indicated/appropriate, mindful of TTP syndrome.   Corean Pouch PTA  12/01/2023 10:41 AM

## 2023-12-02 ENCOUNTER — Ambulatory Visit (INDEPENDENT_AMBULATORY_CARE_PROVIDER_SITE_OTHER): Payer: Medicaid Other | Admitting: Dermatology

## 2023-12-02 ENCOUNTER — Ambulatory Visit: Payer: Self-pay

## 2023-12-02 ENCOUNTER — Encounter: Payer: Self-pay | Admitting: Dermatology

## 2023-12-02 VITALS — BP 128/93

## 2023-12-02 DIAGNOSIS — L739 Follicular disorder, unspecified: Secondary | ICD-10-CM | POA: Diagnosis not present

## 2023-12-02 DIAGNOSIS — L810 Postinflammatory hyperpigmentation: Secondary | ICD-10-CM | POA: Diagnosis not present

## 2023-12-02 MED ORDER — TAZAROTENE 0.1 % EX FOAM: 1.0000 | CUTANEOUS | 6 refills | Status: AC

## 2023-12-02 MED ORDER — CLINDAMYCIN PHOSPHATE 1 % EX LOTN: TOPICAL_LOTION | Freq: Every day | CUTANEOUS | 5 refills | Status: AC

## 2023-12-02 MED ORDER — DOXYCYCLINE HYCLATE 100 MG PO TABS: 100.0000 mg | ORAL_TABLET | Freq: Every day | ORAL | 6 refills | Status: AC

## 2023-12-02 NOTE — Progress Notes (Signed)
   Follow-Up Visit   Subjective  Eric Hooper is a 48 y.o. male established patient who presents for FOLLOW UP on the diagnoses listed below:  Patient was last evaluated on 06/03/23.   Folliculitis: Prescribed Clindamycin  lotion, Tazarotene  foam, & Doxycycline  100mg . Patient stated that the areas have resolved. However, he will still get a bump here and there.   The following portions of the chart were reviewed this encounter and updated as appropriate: medications, allergies, medical history  Review of Systems:  No other skin or systemic complaints except as noted in HPI or Assessment and Plan.  Objective  Well appearing patient in no apparent distress; mood and affect are within normal limits.   A focused examination was performed of the following areas: chest & neck   Relevant exam findings are noted in the Assessment and Plan.          Assessment & Plan   1. Folliculitis Barbae and PIH - Assessment: Patient reports significant improvement in folliculitis affecting beard area, chest, and back of scalp since last visit. However, still experiences occasional flares. Persistent nature attributed to inherent characteristics of tightly coiled hair which tends to grow inward creating ingrown hairs. Current regimen includes clindamycin  lotion, tazarotene  foam, and oral doxycycline . Patient using [place] soap on face which is too harsh and drying for facial skin. - Plan:    Discontinue use of [place] soap on face    Morning routine: alternate between CeraVe Benzyl Peroxide and CeraVe Salicylic Acid face washes, apply clindamycin  swabs, apply Avene moisturizer    Evening routine: wash with gentle cleanser, apply tazarotene  foam, apply Avene Tolerance moisturizer    Continue doxycycline  100 mg orally    Provided samples of Avene Tolerance moisturizer for improved hydration  Follow-up in 4 months to reassess regimen and prepare for winter months.   No follow-ups on  file.   Documentation: I have reviewed the above documentation for accuracy and completeness, and I agree with the above.  I, Shirron Maranda, CMA, am acting as scribe for [employer], DO.   Delon Lenis, DO

## 2023-12-02 NOTE — Patient Instructions (Addendum)
 Date: Thu Dec 02 2023  Hello Tom,  Thank you for visiting today. Here is a summary of the key instructions:  - Medications:   - Continue doxycycline  100 mg daily   - Apply clindamycin  swabs after washing   - Apply tazarotene  foam in evening  - Skin Care:   - Wash face with CeraVe Benzyl Peroxide and CeraVe Salicylic Acid face washes, alternating each day   - Use gentle cleanser in evening   - Apply Avene Tolerance moisturizer morning and evening   - Stop using [place] soap on face   - Use [place] soap only on body, not face  - Follow-up:   - Return for follow-up appointment in 4 months  Please reach out if you have any questions or concerns.  Warm regards,  Dr. Delon Lenis [facility]   Important Information  Due to recent changes in healthcare laws, you may see results of your pathology and/or laboratory studies on MyChart before the doctors have had a chance to review them. We understand that in some cases there may be results that are confusing or concerning to you. Please understand that not all results are received at the same time and often the doctors may need to interpret multiple results in order to provide you with the best plan of care or course of treatment. Therefore, we ask that you please give us  2 business days to thoroughly review all your results before contacting the office for clarification. Should we see a critical lab result, you will be contacted sooner.   If You Need Anything After Your Visit  If you have any questions or concerns for your doctor, please call our main line at [phone] If no one answers, please leave a voicemail as directed and we will return your call as soon as possible. Messages left after 4 pm will be answered the following business day.   You may also send us  a message via MyChart. We typically respond to MyChart messages within 1-2 business days.  For prescription refills, please ask your pharmacy to contact our  office. Our fax number is [phone].  If you have an urgent issue when the clinic is closed that cannot wait until the next business day, you can page your doctor at the number below.    Please note that while we do our best to be available for urgent issues outside of office hours, we are not available 24/7.   If you have an urgent issue and are unable to reach us , you may choose to seek medical care at your doctor's office, retail clinic, urgent care center, or emergency room.  If you have a medical emergency, please immediately call 911 or go to the emergency department. In the event of inclement weather, please call our main line at [phone] for an update on the status of any delays or closures.  Dermatology Medication Tips: Please keep the boxes that topical medications come in in order to help keep track of the instructions about where and how to use these. Pharmacies typically print the medication instructions only on the boxes and not directly on the medication tubes.   If your medication is too expensive, please contact our office at [phone] or send us  a message through MyChart.   We are unable to tell what your co-pay for medications will be in advance as this is different depending on your insurance coverage. However, we may be able to find a substitute medication at lower cost or fill  out paperwork to get insurance to cover a needed medication.   If a prior authorization is required to get your medication covered by your insurance company, please allow us  1-2 business days to complete this process.  Drug prices often vary depending on where the prescription is filled and some pharmacies may offer cheaper prices.  The website www.goodrx.com contains coupons for medications through different pharmacies. The prices here do not account for what the cost may be with help from insurance (it may be cheaper with your insurance), but the website can give you the price if you did  not use any insurance.  - You can print the associated coupon and take it with your prescription to the pharmacy.  - You may also stop by our office during regular business hours and pick up a GoodRx coupon card.  - If you need your prescription sent electronically to a different pharmacy, notify our office through [facility] or by phone at [phone]

## 2023-12-03 ENCOUNTER — Other Ambulatory Visit

## 2023-12-03 ENCOUNTER — Encounter: Payer: Self-pay | Admitting: Physical Therapy

## 2023-12-03 ENCOUNTER — Ambulatory Visit: Admitting: Physical Therapy

## 2023-12-03 DIAGNOSIS — M6281 Muscle weakness (generalized): Secondary | ICD-10-CM

## 2023-12-03 DIAGNOSIS — M542 Cervicalgia: Secondary | ICD-10-CM | POA: Diagnosis not present

## 2023-12-03 DIAGNOSIS — M25512 Pain in left shoulder: Secondary | ICD-10-CM

## 2023-12-03 NOTE — Therapy (Signed)
 OUTPATIENT PHYSICAL THERAPY TREATMENT NOTE   Patient Name: Eric Hooper MRN: [id number] DOB:[birth year], 48 y.o., male Today's Date: 12/03/2023  END OF SESSION:  PT End of Session - 12/03/23 0916     Visit Number 4    Number of Visits 13    Date for PT Re-Evaluation 12/28/23    Authorization Type [employer]    Authorization Time Period auth after 27 visits per appt notes    PT Start Time 0917    PT Stop Time 1000    PT Time Calculation (min) 43 min    Activity Tolerance Patient tolerated treatment well;Patient limited by pain          Past Medical History:  Diagnosis Date   At risk for dysfunction of heart 07/11/2018   Chest wall pain 08/14/2018   Controlled diabetes mellitus type 2 with complications (HCC) 02/25/2018   Diabetes mellitus (HCC) 02/01/2017   Diabetes mellitus without complication (HCC)    non-insulin  dependent   Diverticulosis 02/01/2017   on CT scan abd/pelvis   Eczema    GERD (gastroesophageal reflux disease)    Hypertension    Kidney stones 02/03/2017   Leg swelling 05/12/2017   Macrocytic anemia 02/01/2017   Microscopic hematuria 02/01/2017   Mixed hyperlipidemia 02/28/2018   Phlebitis 05/12/2017   Recurrent upper respiratory infection (URI)    Renal insufficiency 02/03/2017   Smoker 04/13/2017   Past Surgical History:  Procedure Laterality Date   COLONOSCOPY     ELBOW SURGERY Right    ESOPHAGEAL MANOMETRY N/A 09/22/2023   Procedure: MANOMETRY, ESOPHAGUS;  Surgeon: Legrand Victory LITTIE DOUGLAS, MD;  Location: [facility];  Service: Gastroenterology;  Laterality: N/A;   IR FLUORO GUIDE CV LINE RIGHT  02/02/2017   IR FLUORO GUIDE CV LINE RIGHT  11/23/2021   IR FLUORO GUIDE CV LINE RIGHT  11/28/2021   IR REMOVAL TUN CV CATH W/O FL  02/26/2022   IR US  GUIDE VASC ACCESS RIGHT  02/02/2017   IR US  GUIDE VASC ACCESS RIGHT  11/23/2021   PH IMPEDANCE STUDY N/A 09/22/2023   Procedure: IMPEDANCE PH STUDY, ESOPHAGUS;  Surgeon: Legrand Victory LITTIE DOUGLAS, MD;   Location: [facility];  Service: Gastroenterology;  Laterality: N/A;   UPPER GASTROINTESTINAL ENDOSCOPY     Patient Active Problem List   Diagnosis Date Noted   Foot lesion 09/03/2023   Headache 07/23/2022   Cervical disc disorder with radiculopathy of cervical region 06/20/2020   Seasonal allergic rhinitis 03/05/2020   Chronic left shoulder pain 11/27/2019   GERD (gastroesophageal reflux disease) 08/10/2019   Hypertension associated with diabetes (HCC) 08/10/2019   Insomnia 06/09/2019   PICC (peripherally inserted central catheter) in place 09/05/2018   Mixed diabetic hyperlipidemia associated with type 2 diabetes mellitus (HCC) 02/28/2018   Type 2 diabetes mellitus without complications (HCC) 02/25/2018   Tobacco use disorder 04/13/2017   T.T.P. syndrome (HCC) 02/06/2017   Thrombocytopenia (HCC) 02/01/2017    PCP: Toma Matas, MD  REFERRING PROVIDER: Trudy Duwaine BRAVO, NP  REFERRING DIAG: [phone] (ICD-10-CM) - Radiculopathy, cervical region M25.512,G89.29 (ICD-10-CM) - Chronic left shoulder pain  Rationale for Evaluation and Treatment: Rehabilitation  THERAPY DIAG:  Cervicalgia  Left shoulder pain, unspecified chronicity  Muscle weakness (generalized)  ONSET DATE: acute on chronic, exacerbation beginning ~3 months ago  SUBJECTIVE:  SUBJECTIVE STATEMENT: 12/03/2023: Pt states manual seemed like it gave him some relief for about an hour. Feels like symptoms about the same overall since starting PT. Has been trying to do exercises but not much change. Still having the most trouble sleeping, lying on L side.   EVAL: Pt endorses prior issues with L neck/shoulder that were improving, but about 3 months ago woke up with increased pain. States at one point he was supposed to get a shoulder  surgery but was rescheduled d/t his TTP syndrome. He states this exacerbation is not really worsening since onset but is not improving, has been trying to do exercises on his own and feel like they aggravate it. Less pain with rest, worsens with shoulder elevation and WB, lying on L side. Notes that he will sometimes get L shoulder/pec discomfort with abduction (not flexion) and sidelying/WB - states he has spoken w/ provider about it, denies any cardiac symptoms or red flags. No N/T, no headaches, no speech/swallowing issues, no vision issues. Does endorse difficulty picking up objects with L hand. States he is now requiring assistive devices for self care/hygiene tasks (I.e washing behind back)  We do have 2 referrals, one for back and one for neck/shoulder. He states he would prefer to focus on neck/shoulder as it affects him more, back pain still present but has improved. Mostly provoked by sitting and transfers after sitting.    PERTINENT HISTORY:  DM2, GERD, TTP syndrome, HTN, headaches, insomnia  PAIN:  Are you having pain: 8/10 L neck/shoulder and elbow  Per eval:  Location/description: L neck/shoulder down to elbow, more anteriorly but sometimes posteriorly , throbbing Best-worst over past week: 5-10/10  - aggravating factors: raising arm, reaching behind back, lying on L side - Easing factors: hot water, stretching, icing, resting at side  PRECAUTIONS: TTP syndrome  RED FLAGS: None   WEIGHT BEARING RESTRICTIONS: No  FALLS:  Has patient fallen in last 6 months? No  LIVING ENVIRONMENT: 1 story home, 3 STE BIL rail Lives w/ fiancee, housework split  OCCUPATION: not working since TTP per pt report. Used to work for a company doing heavy lifting, Warden/ranger  PLOF: Independent  PATIENT GOALS: less pain, go back to work, be able to raise arm  NEXT MD VISIT: after PT per pt report   OBJECTIVE:  Note: Objective measures were completed at Evaluation unless otherwise  noted.  DIAGNOSTIC FINDINGS:  No recent spine imaging in chart  PATIENT SURVEYS:  NDI: 34/50, 68%  COGNITION: Overall cognitive status: Within functional limits for tasks assessed     SENSATION: LT intact BIL UE   POSTURE: FHP, guarded LUE posture, L UT elevation > R    CERVICAL ROM:    A/PROM  eval AROM 12/03/23  Flexion 55 deg s   Extension 10 deg neck pain   Right lateral flexion 30 deg 42 deg   Left lateral flexion 30 deg * 45 deg painless  Right rotation 35 deg * 58 deg  Left rotation 20 deg * 55 deg   (Blank rows = not tested) (Key: WFL = within functional limits not formally assessed, * = concordant pain, s = stiffness/stretching sensation, NT = not tested)  Comments:     RANGE OF MOTION:      Right eval Left eval  Shoulder flexion WFL A: 124 deg *  Shoulder abduction  A: 96 deg *  Functional ER combo    Functional IR combo    Hip ER  Hip IR    Knee extension    Ankle dorsiflexion     (Blank rows = not tested) (Key: WFL = within functional limits not formally assessed, * = concordant pain, s = stiffness/stretching sensation, NT = not tested)  Comments:    STRENGTH TESTING:  MMT Right eval Left eval  Shoulder flexion 5 4 **  Shoulder abduction 5 4*  Elbow flexion    Elbow extension    Grip strength  115# 60#  Hip flexion    Hip abduction (modified sitting)    Knee flexion    Knee extension    Ankle dorsiflexion    Ankle plantarflexion     (Blank rows = not tested) (Key: WFL = within functional limits not formally assessed, * = concordant pain, s = stiffness/stretching sensation, NT = not tested)  Comments:     FUNCTIONAL TESTS:  See grip strength and functional reaching above   TREATMENT DATE:  [facility] Adult PT Treatment:                                                DATE: 12/03/23 Therapeutic Exercise: CS flexion AROM x12 CS sidebending AROM x14 BIL 5# BIL shrugs 2x12 HEP discussion/education  Manual Therapy: Seated STM and  gentle trigger point release L UT, LS, rhomboids, deltoid  Neuromuscular re-ed: Cervical sidebending isometrics 2x5 cues for neutral posture and comfortable force output Cervical extension isometrics 2x5 cues for neutral posture and comfortable force output    [facility] Adult PT Treatment:                                                DATE: 12/01/23 Therapeutic Exercise (performed in chair for support): Therabar yellow twisting, smiles/frowns 2x30 ea CS AROM flexion x 10 each  CS AROM side bending x 10 each Seated CS retraction 2 x 10 3 sec hold  Seated shrugs 4lb weight 2x10 Grip strength 7lb, 9lb digi flex x 30 sec each Manual Therapy: STM Lt upper trap, cervical paraspinals SO release (painful) Positional release Lt upper trap  [facility] Adult PT Treatment:                                                DATE: 11/24/2023  Therapeutic Exercise: Seated pball rolling x 10 flexion, x10 left scaption  CS AROM flexion x 10 each  CS AROM side bending x 10 each Seated CS retraction 2 x 10 3 sec hold  Seated shrugs 2lb weight  Consider increasing weight at next visit  Grip strength 7lb, 9lb digi flex x 30 sec each Consider adding therabar activities with upper back support  Doorway pec stretch L only 2 x 20 sec  Doorway pec stretch BIL, shoulder extended 2 x 30 sec  Created and reviewed initial HEP     PATIENT EDUCATION:  Education details: rationale for interventions, HEP  Person educated: Patient Education method: Explanation, Demonstration, Tactile cues, Verbal cues Education comprehension: verbalized understanding, returned demonstration, verbal cues required, tactile cues required, and needs further education     HOME EXERCISE PROGRAM: Access Code: [email or web address] URL:  [email or web address] Date: 11/24/2023 Prepared by: Marko Molt  Exercises - Supine Cervical Rotation AROM on Pillow  - 1 x daily - 7 x weekly - 2-3 sets - 10 reps - Supine Cervical Retraction with Towel   - 1 x daily - 7 x weekly - 2-3 sets - 10 reps - 3 sec hold - Seated Scapular Retraction  - 1 x daily - 7 x weekly - 2-3 sets - 10 reps - 3 sec hold - Corner Pec Minor Stretch  - 1 x daily - 7 x weekly - 2-3 sets - 30 sec hold - Standing Pec Stretch at Wall  - 1 x daily - 7 x weekly - 2-3 sets - 30 sec hold  ASSESSMENT:  CLINICAL IMPRESSION: 12/03/2023: Pt arrives w/baseline symptoms, reports no issues after last session and transient relief with manual. Today he demonstrates notable AROM improvements compared to initial eval. Today we address his report of postural weakness w/ addition of cervical isometrics which he tolerates well. Otherwise continuing to progress periscapular/postural work within pt tolerance. Manual as above with pt reporting good tolerance, reduced stiffness. No adverse events, reports baseline pain but improved stiffness on departure. Could consider addition of cervical isometrics to HEP if tolerated well after this visit. Recommend continuing along current POC in order to address relevant deficits and improve functional tolerance. Pt departs today's session in no acute distress, all voiced questions/concerns addressed appropriately from PT perspective.      EVAL: Patient is a 48 y.o. gentleman who was seen today for physical therapy evaluation and treatment for neck/shoulder pain- also has referral for back but he states he would prefer to focus on the former as it is affecting daily activities more prominently. No red flags today - he is inquisitive re: some L anterior shoulder/pec pain, only provoked by WB through shoulder and abduction, denies any cardiac symptoms or red flags. On exam he demonstrates concordant limitations in shoulder/cervical mobility, GH/grip strength. Tolerates exam well overall, deferring HEP w/ time constraints. No adverse events. Recommend trial of skilled PT to address aforementioned deficits with aim of improving functional tolerance and reducing pain  with typical activities. Pt departs today's session in no acute distress, all voiced concerns/questions addressed appropriately from PT perspective.    OBJECTIVE IMPAIRMENTS: decreased activity tolerance, decreased endurance, decreased mobility, decreased ROM, decreased strength, impaired perceived functional ability, impaired UE functional use, and pain.   ACTIVITY LIMITATIONS: carrying, lifting, sitting, sleeping, bathing, dressing, reach over head, and hygiene/grooming  PARTICIPATION LIMITATIONS: meal prep, cleaning, laundry, community activity, and occupation  PERSONAL FACTORS: Time since onset of injury/illness/exacerbation and 3+ comorbidities: DM2, GERD, TTP syndrome, HTN, headaches, insomnia are also affecting patient's functional outcome.   REHAB POTENTIAL: Good  CLINICAL DECISION MAKING: Evolving/moderate complexity  EVALUATION COMPLEXITY: Moderate   GOALS:   SHORT TERM GOALS: Target date: 12/07/2023  Pt will demonstrate appropriate understanding and performance of initially prescribed HEP in order to facilitate improved independence with management of symptoms.  Baseline: HEP TBD  Goal status: INITIAL   2. Pt will report at least 25% improvement in overall pain levels over past week in order to facilitate improved tolerance to typical daily activities.   Baseline: 5-10/10  Goal status: INITIAL     LONG TERM GOALS: Target date: 12/28/2023  Pt will score less than or equal to 20/50 (40%) on NDI in order to demonstrate improved perception of function due to symptoms ([facility] 10-13 pts per Neysa dunker al 2009, 2010). Baseline: 34/50; 68% Goal status:  INITIAL  2. Pt will demonstrate at least 50 degrees of active cervical rotation ROM bilaterally in order to demonstrate improved environmental awareness and safety with driving.  Baseline: see ROM chart above Goal status: INITIAL  3. Pt will demonstrate at least 4+/5 shoulder flex/abd MMT BIL for improved symmetry of UE strength and  improved tolerance to functional movements.  Baseline: see MMT chart above Goal status: INITIAL   4. Pt will report ability to perform usual self care/hygiene tasks with integration of BIL UE for improved functional tolerance.  Baseline: reports relying solely on RUE for washing back Goal status: INITIAL   5. Pt will report at least 50% decrease in overall pain levels in past week in order to facilitate improved tolerance to basic ADLs/mobility.   Baseline: 5-10/10  Goal status: INITIAL    PLAN:  PT FREQUENCY: 2x/week  PT DURATION: 6 weeks  PLANNED INTERVENTIONS: [id number]- PT Re-evaluation, 97750- Physical Performance Testing, 97110-Therapeutic exercises, 97530- Therapeutic activity, 97112- Neuromuscular re-education, 97535- Self Care, [id number]- Manual therapy, Patient/Family education, Balance training, Taping, Joint mobilization, Spinal mobilization, Cryotherapy, and Moist heat.  PLAN FOR NEXT SESSION: establish HEP. Work on gentle GH/periscapular strengthening, GH mobility, grip strengthening. Symptom modification strategies as indicated/appropriate, mindful of TTP syndrome.   Alm DELENA Jenny PT, DPT 12/03/2023 10:06 AM

## 2023-12-07 ENCOUNTER — Ambulatory Visit

## 2023-12-07 DIAGNOSIS — M542 Cervicalgia: Secondary | ICD-10-CM

## 2023-12-07 DIAGNOSIS — M25512 Pain in left shoulder: Secondary | ICD-10-CM

## 2023-12-07 DIAGNOSIS — M6281 Muscle weakness (generalized): Secondary | ICD-10-CM

## 2023-12-07 NOTE — Therapy (Signed)
 OUTPATIENT PHYSICAL THERAPY TREATMENT NOTE   Patient Name: Eric Hooper MRN: [id number] DOB:27-Mar-1976, 48 y.o., male Today's Date: 12/07/2023  END OF SESSION:  PT End of Session - 12/07/23 0959     Visit Number 5    Number of Visits 13    Date for PT Re-Evaluation 12/28/23    Authorization Type [employer]    Authorization Time Period auth after 27 visits per appt notes    PT Start Time 1000    PT Stop Time 1038    PT Time Calculation (min) 38 min    Activity Tolerance Patient limited by pain    Behavior During Therapy [facility] for tasks assessed/performed          Past Medical History:  Diagnosis Date   At risk for dysfunction of heart 07/11/2018   Chest wall pain 08/14/2018   Controlled diabetes mellitus type 2 with complications (HCC) 02/25/2018   Diabetes mellitus (HCC) 02/01/2017   Diabetes mellitus without complication (HCC)    non-insulin  dependent   Diverticulosis 02/01/2017   on CT scan abd/pelvis   Eczema    GERD (gastroesophageal reflux disease)    Hypertension    Kidney stones 02/03/2017   Leg swelling 05/12/2017   Macrocytic anemia 02/01/2017   Microscopic hematuria 02/01/2017   Mixed hyperlipidemia 02/28/2018   Phlebitis 05/12/2017   Recurrent upper respiratory infection (URI)    Renal insufficiency 02/03/2017   Smoker 04/13/2017   Past Surgical History:  Procedure Laterality Date   COLONOSCOPY     ELBOW SURGERY Right    ESOPHAGEAL MANOMETRY N/A 09/22/2023   Procedure: MANOMETRY, ESOPHAGUS;  Surgeon: Legrand Victory LITTIE DOUGLAS, MD;  Location: [facility];  Service: Gastroenterology;  Laterality: N/A;   IR FLUORO GUIDE CV LINE RIGHT  02/02/2017   IR FLUORO GUIDE CV LINE RIGHT  11/23/2021   IR FLUORO GUIDE CV LINE RIGHT  11/28/2021   IR REMOVAL TUN CV CATH W/O FL  02/26/2022   IR US  GUIDE VASC ACCESS RIGHT  02/02/2017   IR US  GUIDE VASC ACCESS RIGHT  11/23/2021   PH IMPEDANCE STUDY N/A 09/22/2023   Procedure: IMPEDANCE PH STUDY, ESOPHAGUS;   Surgeon: Legrand Victory LITTIE DOUGLAS, MD;  Location: [facility];  Service: Gastroenterology;  Laterality: N/A;   UPPER GASTROINTESTINAL ENDOSCOPY     Patient Active Problem List   Diagnosis Date Noted   Foot lesion 09/03/2023   Headache 07/23/2022   Cervical disc disorder with radiculopathy of cervical region 06/20/2020   Seasonal allergic rhinitis 03/05/2020   Chronic left shoulder pain 11/27/2019   GERD (gastroesophageal reflux disease) 08/10/2019   Hypertension associated with diabetes (HCC) 08/10/2019   Insomnia 06/09/2019   PICC (peripherally inserted central catheter) in place 09/05/2018   Mixed diabetic hyperlipidemia associated with type 2 diabetes mellitus (HCC) 02/28/2018   Type 2 diabetes mellitus without complications (HCC) 02/25/2018   Tobacco use disorder 04/13/2017   T.T.P. syndrome (HCC) 02/06/2017   Thrombocytopenia (HCC) 02/01/2017    PCP: Toma Matas, MD  REFERRING PROVIDER: Trudy Duwaine BRAVO, NP  REFERRING DIAG: [phone] (ICD-10-CM) - Radiculopathy, cervical region M25.512,G89.29 (ICD-10-CM) - Chronic left shoulder pain  Rationale for Evaluation and Treatment: Rehabilitation  THERAPY DIAG:  Cervicalgia  Left shoulder pain, unspecified chronicity  Muscle weakness (generalized)  ONSET DATE: acute on chronic, exacerbation beginning ~3 months ago  SUBJECTIVE:  SUBJECTIVE STATEMENT: Patient reports that he woke up sore today on the Lt side of his neck and right side of his lower back. He partially attributes this to the rainy weather.    EVAL: Pt endorses prior issues with L neck/shoulder that were improving, but about 3 months ago woke up with increased pain. States at one point he was supposed to get a shoulder surgery but was rescheduled d/t his TTP syndrome. He states this  exacerbation is not really worsening since onset but is not improving, has been trying to do exercises on his own and feel like they aggravate it. Less pain with rest, worsens with shoulder elevation and WB, lying on L side. Notes that he will sometimes get L shoulder/pec discomfort with abduction (not flexion) and sidelying/WB - states he has spoken w/ provider about it, denies any cardiac symptoms or red flags. No N/T, no headaches, no speech/swallowing issues, no vision issues. Does endorse difficulty picking up objects with L hand. States he is now requiring assistive devices for self care/hygiene tasks (I.e washing behind back)  We do have 2 referrals, one for back and one for neck/shoulder. He states he would prefer to focus on neck/shoulder as it affects him more, back pain still present but has improved. Mostly provoked by sitting and transfers after sitting.    PERTINENT HISTORY:  DM2, GERD, TTP syndrome, HTN, headaches, insomnia  PAIN:  Are you having pain: 8/10 L neck/shoulder and elbow  Per eval:  Location/description: L neck/shoulder down to elbow, more anteriorly but sometimes posteriorly , throbbing Best-worst over past week: 5-10/10  - aggravating factors: raising arm, reaching behind back, lying on L side - Easing factors: hot water, stretching, icing, resting at side  PRECAUTIONS: TTP syndrome  RED FLAGS: None   WEIGHT BEARING RESTRICTIONS: No  FALLS:  Has patient fallen in last 6 months? No  LIVING ENVIRONMENT: 1 story home, 3 STE BIL rail Lives w/ fiancee, housework split  OCCUPATION: not working since TTP per pt report. Used to work for a company doing heavy lifting, Warden/ranger  PLOF: Independent  PATIENT GOALS: less pain, go back to work, be able to raise arm  NEXT MD VISIT: after PT per pt report   OBJECTIVE:  Note: Objective measures were completed at Evaluation unless otherwise noted.  DIAGNOSTIC FINDINGS:  No recent spine imaging in  chart  PATIENT SURVEYS:  NDI: 34/50, 68%  COGNITION: Overall cognitive status: Within functional limits for tasks assessed     SENSATION: LT intact BIL UE   POSTURE: FHP, guarded LUE posture, L UT elevation > R    CERVICAL ROM:    A/PROM  eval AROM 12/03/23  Flexion 55 deg s   Extension 10 deg neck pain   Right lateral flexion 30 deg 42 deg   Left lateral flexion 30 deg * 45 deg painless  Right rotation 35 deg * 58 deg  Left rotation 20 deg * 55 deg   (Blank rows = not tested) (Key: WFL = within functional limits not formally assessed, * = concordant pain, s = stiffness/stretching sensation, NT = not tested)  Comments:     RANGE OF MOTION:      Right eval Left eval  Shoulder flexion WFL A: 124 deg *  Shoulder abduction  A: 96 deg *  Functional ER combo    Functional IR combo    Hip ER    Hip IR    Knee extension    Ankle dorsiflexion     (  Blank rows = not tested) (Key: WFL = within functional limits not formally assessed, * = concordant pain, s = stiffness/stretching sensation, NT = not tested)  Comments:    STRENGTH TESTING:  MMT Right eval Left eval  Shoulder flexion 5 4 **  Shoulder abduction 5 4*  Elbow flexion    Elbow extension    Grip strength  115# 60#  Hip flexion    Hip abduction (modified sitting)    Knee flexion    Knee extension    Ankle dorsiflexion    Ankle plantarflexion     (Blank rows = not tested) (Key: WFL = within functional limits not formally assessed, * = concordant pain, s = stiffness/stretching sensation, NT = not tested)  Comments:     FUNCTIONAL TESTS:  See grip strength and functional reaching above   TREATMENT DATE:  [facility] Adult PT Treatment:                                                DATE: 12/07/23 Therapeutic Exercise: Therabar yellow twisting, smiles/frowns 2x30 ea CS flexion AROM x12 CS sidebending AROM x12 BIL CS rotation x12 BIL 5# BIL shrugs 2x12 Grip strength 7lb digi flex x 30 sec each Seated CS  retraction 2 x 10 3 sec hold  Manual Therapy: Seated STM and gentle trigger point release L UT, LS, rhomboids, deltoid Neuromuscular re-ed: Cervical sidebending isometrics 2x5 cues for neutral posture and comfortable force output Cervical extension isometrics 2x5 cues for neutral posture and comfortable force output Self Care: Theracane self instruction, MTPR, tack and stretch  [facility] Adult PT Treatment:                                                DATE: 12/03/23 Therapeutic Exercise: CS flexion AROM x12 CS sidebending AROM x14 BIL 5# BIL shrugs 2x12 HEP discussion/education  Manual Therapy: Seated STM and gentle trigger point release L UT, LS, rhomboids, deltoid  Neuromuscular re-ed: Cervical sidebending isometrics 2x5 cues for neutral posture and comfortable force output Cervical extension isometrics 2x5 cues for neutral posture and comfortable force output    [facility] Adult PT Treatment:                                                DATE: 12/01/23 Therapeutic Exercise (performed in chair for support): Therabar yellow twisting, smiles/frowns 2x30 ea CS AROM flexion x 10 each  CS AROM side bending x 10 each Seated CS retraction 2 x 10 3 sec hold  Seated shrugs 4lb weight 2x10 Grip strength 7lb, 9lb digi flex x 30 sec each Manual Therapy: STM Lt upper trap, cervical paraspinals SO release (painful) Positional release Lt upper trap   PATIENT EDUCATION:  Education details: rationale for interventions, HEP  Person educated: Patient Education method: Explanation, Demonstration, Tactile cues, Verbal cues Education comprehension: verbalized understanding, returned demonstration, verbal cues required, tactile cues required, and needs further education     HOME EXERCISE PROGRAM: Access Code: [email or web address] URL: [email or web address] Date: 11/24/2023 Prepared by: Marko Molt  Exercises - Supine Cervical Rotation AROM on Pillow  - 1  x daily - 7 x weekly - 2-3 sets - 10  reps - Supine Cervical Retraction with Towel  - 1 x daily - 7 x weekly - 2-3 sets - 10 reps - 3 sec hold - Seated Scapular Retraction  - 1 x daily - 7 x weekly - 2-3 sets - 10 reps - 3 sec hold - Corner Pec Minor Stretch  - 1 x daily - 7 x weekly - 2-3 sets - 30 sec hold - Standing Pec Stretch at Wall  - 1 x daily - 7 x weekly - 2-3 sets - 30 sec hold  ASSESSMENT:  CLINICAL IMPRESSION: Patient presents to PT reporting no change in symptoms, continues to have transient relief with manual techniques. Session today continued to focus on cervical ROM, gentle strengthening, and manual techniques to decrease tension. He continues to be limited by pain throughout session. Patient continues to benefit from skilled PT services and should be progressed as able to improve functional independence.   EVAL: Patient is a 48 y.o. gentleman who was seen today for physical therapy evaluation and treatment for neck/shoulder pain- also has referral for back but he states he would prefer to focus on the former as it is affecting daily activities more prominently. No red flags today - he is inquisitive re: some L anterior shoulder/pec pain, only provoked by WB through shoulder and abduction, denies any cardiac symptoms or red flags. On exam he demonstrates concordant limitations in shoulder/cervical mobility, GH/grip strength. Tolerates exam well overall, deferring HEP w/ time constraints. No adverse events. Recommend trial of skilled PT to address aforementioned deficits with aim of improving functional tolerance and reducing pain with typical activities. Pt departs today's session in no acute distress, all voiced concerns/questions addressed appropriately from PT perspective.    OBJECTIVE IMPAIRMENTS: decreased activity tolerance, decreased endurance, decreased mobility, decreased ROM, decreased strength, impaired perceived functional ability, impaired UE functional use, and pain.   ACTIVITY LIMITATIONS: carrying,  lifting, sitting, sleeping, bathing, dressing, reach over head, and hygiene/grooming  PARTICIPATION LIMITATIONS: meal prep, cleaning, laundry, community activity, and occupation  PERSONAL FACTORS: Time since onset of injury/illness/exacerbation and 3+ comorbidities: DM2, GERD, TTP syndrome, HTN, headaches, insomnia are also affecting patient's functional outcome.   REHAB POTENTIAL: Good  CLINICAL DECISION MAKING: Evolving/moderate complexity  EVALUATION COMPLEXITY: Moderate   GOALS:   SHORT TERM GOALS: Target date: 12/07/2023  Pt will demonstrate appropriate understanding and performance of initially prescribed HEP in order to facilitate improved independence with management of symptoms.  Baseline: HEP TBD  Goal status: MET Pt reports adherence 12/07/23   2. Pt will report at least 25% improvement in overall pain levels over past week in order to facilitate improved tolerance to typical daily activities.   Baseline: 5-10/10  Goal status: Not progressing  Pt reports no change in pain 12/07/23   LONG TERM GOALS: Target date: 12/28/2023  Pt will score less than or equal to 20/50 (40%) on NDI in order to demonstrate improved perception of function due to symptoms ([facility] 10-13 pts per Neysa dunker al 2009, 2010). Baseline: 34/50; 68% Goal status: INITIAL  2. Pt will demonstrate at least 50 degrees of active cervical rotation ROM bilaterally in order to demonstrate improved environmental awareness and safety with driving.  Baseline: see ROM chart above Goal status: INITIAL  3. Pt will demonstrate at least 4+/5 shoulder flex/abd MMT BIL for improved symmetry of UE strength and improved tolerance to functional movements.  Baseline: see MMT chart above Goal status: INITIAL  4. Pt will report ability to perform usual self care/hygiene tasks with integration of BIL UE for improved functional tolerance.  Baseline: reports relying solely on RUE for washing back Goal status: INITIAL   5. Pt  will report at least 50% decrease in overall pain levels in past week in order to facilitate improved tolerance to basic ADLs/mobility.   Baseline: 5-10/10  Goal status: INITIAL    PLAN:  PT FREQUENCY: 2x/week  PT DURATION: 6 weeks  PLANNED INTERVENTIONS: 97164- PT Re-evaluation, 97750- Physical Performance Testing, 97110-Therapeutic exercises, 97530- Therapeutic activity, 97112- Neuromuscular re-education, 97535- Self Care, [id number]- Manual therapy, Patient/Family education, Balance training, Taping, Joint mobilization, Spinal mobilization, Cryotherapy, and Moist heat.  PLAN FOR NEXT SESSION: establish HEP. Work on gentle GH/periscapular strengthening, GH mobility, grip strengthening. Symptom modification strategies as indicated/appropriate, mindful of TTP syndrome.   Corean Pouch PTA  12/07/2023 10:39 AM

## 2023-12-09 ENCOUNTER — Encounter: Payer: Self-pay | Admitting: Allergy & Immunology

## 2023-12-10 ENCOUNTER — Ambulatory Visit

## 2023-12-10 DIAGNOSIS — M542 Cervicalgia: Secondary | ICD-10-CM | POA: Diagnosis not present

## 2023-12-10 DIAGNOSIS — M6281 Muscle weakness (generalized): Secondary | ICD-10-CM

## 2023-12-10 DIAGNOSIS — M25512 Pain in left shoulder: Secondary | ICD-10-CM

## 2023-12-10 NOTE — Therapy (Signed)
 OUTPATIENT PHYSICAL THERAPY TREATMENT NOTE   Patient Name: Eric Hooper MRN: [id number] DOB:1975-05-17, 48 y.o., male Today's Date: 12/10/2023  END OF SESSION:  PT End of Session - 12/10/23 1001     Visit Number 6    Number of Visits 13    Date for PT Re-Evaluation 12/28/23    Authorization Type [employer]    Authorization Time Period auth after 27 visits per appt notes    PT Start Time 1000    PT Stop Time 1038    PT Time Calculation (min) 38 min    Activity Tolerance Patient limited by pain;Patient tolerated treatment well    Behavior During Therapy [facility] for tasks assessed/performed           Past Medical History:  Diagnosis Date   At risk for dysfunction of heart 07/11/2018   Chest wall pain 08/14/2018   Controlled diabetes mellitus type 2 with complications (HCC) 02/25/2018   Diabetes mellitus (HCC) 02/01/2017   Diabetes mellitus without complication (HCC)    non-insulin  dependent   Diverticulosis 02/01/2017   on CT scan abd/pelvis   Eczema    GERD (gastroesophageal reflux disease)    Hypertension    Kidney stones 02/03/2017   Leg swelling 05/12/2017   Macrocytic anemia 02/01/2017   Microscopic hematuria 02/01/2017   Mixed hyperlipidemia 02/28/2018   Phlebitis 05/12/2017   Recurrent upper respiratory infection (URI)    Renal insufficiency 02/03/2017   Smoker 04/13/2017   Past Surgical History:  Procedure Laterality Date   COLONOSCOPY     ELBOW SURGERY Right    ESOPHAGEAL MANOMETRY N/A 09/22/2023   Procedure: MANOMETRY, ESOPHAGUS;  Surgeon: Legrand Victory LITTIE DOUGLAS, MD;  Location: [facility];  Service: Gastroenterology;  Laterality: N/A;   IR FLUORO GUIDE CV LINE RIGHT  02/02/2017   IR FLUORO GUIDE CV LINE RIGHT  11/23/2021   IR FLUORO GUIDE CV LINE RIGHT  11/28/2021   IR REMOVAL TUN CV CATH W/O FL  02/26/2022   IR US  GUIDE VASC ACCESS RIGHT  02/02/2017   IR US  GUIDE VASC ACCESS RIGHT  11/23/2021   PH IMPEDANCE STUDY N/A 09/22/2023   Procedure:  IMPEDANCE PH STUDY, ESOPHAGUS;  Surgeon: Legrand Victory LITTIE DOUGLAS, MD;  Location: [facility];  Service: Gastroenterology;  Laterality: N/A;   UPPER GASTROINTESTINAL ENDOSCOPY     Patient Active Problem List   Diagnosis Date Noted   Foot lesion 09/03/2023   Headache 07/23/2022   Cervical disc disorder with radiculopathy of cervical region 06/20/2020   Seasonal allergic rhinitis 03/05/2020   Chronic left shoulder pain 11/27/2019   GERD (gastroesophageal reflux disease) 08/10/2019   Hypertension associated with diabetes (HCC) 08/10/2019   Insomnia 06/09/2019   PICC (peripherally inserted central catheter) in place 09/05/2018   Mixed diabetic hyperlipidemia associated with type 2 diabetes mellitus (HCC) 02/28/2018   Type 2 diabetes mellitus without complications (HCC) 02/25/2018   Tobacco use disorder 04/13/2017   T.T.P. syndrome (HCC) 02/06/2017   Thrombocytopenia (HCC) 02/01/2017    PCP: Toma Matas, MD  REFERRING PROVIDER: Trudy Duwaine BRAVO, NP  REFERRING DIAG: [phone] (ICD-10-CM) - Radiculopathy, cervical region M25.512,G89.29 (ICD-10-CM) - Chronic left shoulder pain  Rationale for Evaluation and Treatment: Rehabilitation  THERAPY DIAG:  Cervicalgia  Left shoulder pain, unspecified chronicity  Muscle weakness (generalized)  ONSET DATE: acute on chronic, exacerbation beginning ~3 months ago  SUBJECTIVE:  SUBJECTIVE STATEMENT: Patient reports that he feels the same, slept the same, is also still having some lower back pain.   EVAL: Pt endorses prior issues with L neck/shoulder that were improving, but about 3 months ago woke up with increased pain. States at one point he was supposed to get a shoulder surgery but was rescheduled d/t his TTP syndrome. He states this exacerbation is not really  worsening since onset but is not improving, has been trying to do exercises on his own and feel like they aggravate it. Less pain with rest, worsens with shoulder elevation and WB, lying on L side. Notes that he will sometimes get L shoulder/pec discomfort with abduction (not flexion) and sidelying/WB - states he has spoken w/ provider about it, denies any cardiac symptoms or red flags. No N/T, no headaches, no speech/swallowing issues, no vision issues. Does endorse difficulty picking up objects with L hand. States he is now requiring assistive devices for self care/hygiene tasks (I.e washing behind back)  We do have 2 referrals, one for back and one for neck/shoulder. He states he would prefer to focus on neck/shoulder as it affects him more, back pain still present but has improved. Mostly provoked by sitting and transfers after sitting.    PERTINENT HISTORY:  DM2, GERD, TTP syndrome, HTN, headaches, insomnia  PAIN:  Are you having pain: 8/10 L neck/shoulder and elbow  Per eval:  Location/description: L neck/shoulder down to elbow, more anteriorly but sometimes posteriorly , throbbing Best-worst over past week: 5-10/10  - aggravating factors: raising arm, reaching behind back, lying on L side - Easing factors: hot water, stretching, icing, resting at side  PRECAUTIONS: TTP syndrome  RED FLAGS: None   WEIGHT BEARING RESTRICTIONS: No  FALLS:  Has patient fallen in last 6 months? No  LIVING ENVIRONMENT: 1 story home, 3 STE BIL rail Lives w/ fiancee, housework split  OCCUPATION: not working since TTP per pt report. Used to work for a company doing heavy lifting, Warden/ranger  PLOF: Independent  PATIENT GOALS: less pain, go back to work, be able to raise arm  NEXT MD VISIT: after PT per pt report   OBJECTIVE:  Note: Objective measures were completed at Evaluation unless otherwise noted.  DIAGNOSTIC FINDINGS:  No recent spine imaging in chart  PATIENT SURVEYS:  NDI:  34/50, 68%  COGNITION: Overall cognitive status: Within functional limits for tasks assessed     SENSATION: LT intact BIL UE   POSTURE: FHP, guarded LUE posture, L UT elevation > R    CERVICAL ROM:    A/PROM  eval AROM 12/03/23  Flexion 55 deg s   Extension 10 deg neck pain   Right lateral flexion 30 deg 42 deg   Left lateral flexion 30 deg * 45 deg painless  Right rotation 35 deg * 58 deg  Left rotation 20 deg * 55 deg   (Blank rows = not tested) (Key: WFL = within functional limits not formally assessed, * = concordant pain, s = stiffness/stretching sensation, NT = not tested)  Comments:     RANGE OF MOTION:      Right eval Left eval  Shoulder flexion WFL A: 124 deg *  Shoulder abduction  A: 96 deg *  Functional ER combo    Functional IR combo    Hip ER    Hip IR    Knee extension    Ankle dorsiflexion     (Blank rows = not tested) (Key: WFL = within functional  limits not formally assessed, * = concordant pain, s = stiffness/stretching sensation, NT = not tested)  Comments:    STRENGTH TESTING:  MMT Right eval Left eval  Shoulder flexion 5 4 **  Shoulder abduction 5 4*  Elbow flexion    Elbow extension    Grip strength  115# 60#  Hip flexion    Hip abduction (modified sitting)    Knee flexion    Knee extension    Ankle dorsiflexion    Ankle plantarflexion     (Blank rows = not tested) (Key: WFL = within functional limits not formally assessed, * = concordant pain, s = stiffness/stretching sensation, NT = not tested)  Comments:     FUNCTIONAL TESTS:  See grip strength and functional reaching above   TREATMENT DATE:  [facility] Adult PT Treatment:                                                DATE: 12/10/23 Therapeutic Exercise: Therabar yellow twisting, smiles/frowns 2x30 ea Seated upper trap stretch 2x30 BIL CS flexion AROM x10 CS rotation x10 BIL Grip strength 5lb and 7lb digi flex x 30 sec each Seated CS retraction 2 x 10 3 sec hold   Neuromuscular re-ed: Cervical sidebending isometrics x10 BIL cues for neutral posture and comfortable force output Rows GTB 2x10 - cues for posture Shoulder extension GTB 2x10 - cues for posture Seated BIL ER with scap retraction YTB 2x10  [facility] Adult PT Treatment:                                                DATE: 12/07/23 Therapeutic Exercise: Therabar yellow twisting, smiles/frowns 2x30 ea CS flexion AROM x12 CS sidebending AROM x12 BIL CS rotation x12 BIL 5# BIL shrugs 2x12 Grip strength 7lb digi flex x 30 sec each Seated CS retraction 2 x 10 3 sec hold  Manual Therapy: Seated STM and gentle trigger point release L UT, LS, rhomboids, deltoid Neuromuscular re-ed: Cervical sidebending isometrics 2x5 cues for neutral posture and comfortable force output Cervical extension isometrics 2x5 cues for neutral posture and comfortable force output Self Care: Theracane self instruction, MTPR, tack and stretch  [facility] Adult PT Treatment:                                                DATE: 12/03/23 Therapeutic Exercise: CS flexion AROM x12 CS sidebending AROM x14 BIL 5# BIL shrugs 2x12 HEP discussion/education  Manual Therapy: Seated STM and gentle trigger point release L UT, LS, rhomboids, deltoid  Neuromuscular re-ed: Cervical sidebending isometrics 2x5 cues for neutral posture and comfortable force output Cervical extension isometrics 2x5 cues for neutral posture and comfortable force output    PATIENT EDUCATION:  Education details: rationale for interventions, HEP  Person educated: Patient Education method: Explanation, Demonstration, Tactile cues, Verbal cues Education comprehension: verbalized understanding, returned demonstration, verbal cues required, tactile cues required, and needs further education     HOME EXERCISE PROGRAM: Access Code: [email or web address] URL: [email or web address] Date: 11/24/2023 Prepared by: Marko Molt  Exercises - Supine Cervical  Rotation AROM on Pillow  -  1 x daily - 7 x weekly - 2-3 sets - 10 reps - Supine Cervical Retraction with Towel  - 1 x daily - 7 x weekly - 2-3 sets - 10 reps - 3 sec hold - Seated Scapular Retraction  - 1 x daily - 7 x weekly - 2-3 sets - 10 reps - 3 sec hold - Corner Pec Minor Stretch  - 1 x daily - 7 x weekly - 2-3 sets - 30 sec hold - Standing Pec Stretch at Wall  - 1 x daily - 7 x weekly - 2-3 sets - 30 sec hold  ASSESSMENT:  CLINICAL IMPRESSION: Patient presents to PT reporting no change in symptoms, reports transient relief from manual, pain returns with any activity. Session today continued to focus on strengthening for shoulders with addition of more periscapular strengthening to good effect. Patient was able to tolerate all prescribed exercises with no adverse effects. Patient continues to benefit from skilled PT services and should be progressed as able to improve functional independence.   EVAL: Patient is a 48 y.o. gentleman who was seen today for physical therapy evaluation and treatment for neck/shoulder pain- also has referral for back but he states he would prefer to focus on the former as it is affecting daily activities more prominently. No red flags today - he is inquisitive re: some L anterior shoulder/pec pain, only provoked by WB through shoulder and abduction, denies any cardiac symptoms or red flags. On exam he demonstrates concordant limitations in shoulder/cervical mobility, GH/grip strength. Tolerates exam well overall, deferring HEP w/ time constraints. No adverse events. Recommend trial of skilled PT to address aforementioned deficits with aim of improving functional tolerance and reducing pain with typical activities. Pt departs today's session in no acute distress, all voiced concerns/questions addressed appropriately from PT perspective.    OBJECTIVE IMPAIRMENTS: decreased activity tolerance, decreased endurance, decreased mobility, decreased ROM, decreased strength,  impaired perceived functional ability, impaired UE functional use, and pain.   ACTIVITY LIMITATIONS: carrying, lifting, sitting, sleeping, bathing, dressing, reach over head, and hygiene/grooming  PARTICIPATION LIMITATIONS: meal prep, cleaning, laundry, community activity, and occupation  PERSONAL FACTORS: Time since onset of injury/illness/exacerbation and 3+ comorbidities: DM2, GERD, TTP syndrome, HTN, headaches, insomnia are also affecting patient's functional outcome.   REHAB POTENTIAL: Good  CLINICAL DECISION MAKING: Evolving/moderate complexity  EVALUATION COMPLEXITY: Moderate   GOALS:   SHORT TERM GOALS: Target date: 12/07/2023  Pt will demonstrate appropriate understanding and performance of initially prescribed HEP in order to facilitate improved independence with management of symptoms.  Baseline: HEP TBD  Goal status: MET Pt reports adherence 12/07/23   2. Pt will report at least 25% improvement in overall pain levels over past week in order to facilitate improved tolerance to typical daily activities.   Baseline: 5-10/10  Goal status: Not progressing  Pt reports no change in pain 12/07/23   LONG TERM GOALS: Target date: 12/28/2023  Pt will score less than or equal to 20/50 (40%) on NDI in order to demonstrate improved perception of function due to symptoms ([facility] 10-13 pts per Neysa dunker al 2009, 2010). Baseline: 34/50; 68% Goal status: INITIAL  2. Pt will demonstrate at least 50 degrees of active cervical rotation ROM bilaterally in order to demonstrate improved environmental awareness and safety with driving.  Baseline: see ROM chart above Goal status: INITIAL  3. Pt will demonstrate at least 4+/5 shoulder flex/abd MMT BIL for improved symmetry of UE strength and improved tolerance to functional movements.  Baseline:  see MMT chart above Goal status: INITIAL   4. Pt will report ability to perform usual self care/hygiene tasks with integration of BIL UE for improved  functional tolerance.  Baseline: reports relying solely on RUE for washing back Goal status: INITIAL   5. Pt will report at least 50% decrease in overall pain levels in past week in order to facilitate improved tolerance to basic ADLs/mobility.   Baseline: 5-10/10  Goal status: INITIAL    PLAN:  PT FREQUENCY: 2x/week  PT DURATION: 6 weeks  PLANNED INTERVENTIONS: 97164- PT Re-evaluation, 97750- Physical Performance Testing, 97110-Therapeutic exercises, 97530- Therapeutic activity, 97112- Neuromuscular re-education, 97535- Self Care, [id number]- Manual therapy, Patient/Family education, Balance training, Taping, Joint mobilization, Spinal mobilization, Cryotherapy, and Moist heat.  PLAN FOR NEXT SESSION: establish HEP. Work on gentle GH/periscapular strengthening, GH mobility, grip strengthening. Symptom modification strategies as indicated/appropriate, mindful of TTP syndrome.   Corean Pouch PTA  12/10/2023 10:37 AM

## 2023-12-14 ENCOUNTER — Ambulatory Visit

## 2023-12-14 DIAGNOSIS — M25512 Pain in left shoulder: Secondary | ICD-10-CM

## 2023-12-14 DIAGNOSIS — M6281 Muscle weakness (generalized): Secondary | ICD-10-CM

## 2023-12-14 DIAGNOSIS — M542 Cervicalgia: Secondary | ICD-10-CM

## 2023-12-14 NOTE — Therapy (Signed)
 OUTPATIENT PHYSICAL THERAPY TREATMENT NOTE   Patient Name: Eric Hooper MRN: [id number] DOB:1975/05/14, 48 y.o., male Today's Date: 12/14/2023  END OF SESSION:  PT End of Session - 12/14/23 1000     Visit Number 7    Number of Visits 13    Date for PT Re-Evaluation 12/28/23    Authorization Type [employer]    Authorization Time Period auth after 27 visits per appt notes    PT Start Time 1000    PT Stop Time 1038    PT Time Calculation (min) 38 min    Activity Tolerance Patient limited by pain;Patient tolerated treatment well    Behavior During Therapy [facility] for tasks assessed/performed          Past Medical History:  Diagnosis Date   At risk for dysfunction of heart 07/11/2018   Chest wall pain 08/14/2018   Controlled diabetes mellitus type 2 with complications (HCC) 02/25/2018   Diabetes mellitus (HCC) 02/01/2017   Diabetes mellitus without complication (HCC)    non-insulin  dependent   Diverticulosis 02/01/2017   on CT scan abd/pelvis   Eczema    GERD (gastroesophageal reflux disease)    Hypertension    Kidney stones 02/03/2017   Leg swelling 05/12/2017   Macrocytic anemia 02/01/2017   Microscopic hematuria 02/01/2017   Mixed hyperlipidemia 02/28/2018   Phlebitis 05/12/2017   Recurrent upper respiratory infection (URI)    Renal insufficiency 02/03/2017   Smoker 04/13/2017   Past Surgical History:  Procedure Laterality Date   COLONOSCOPY     ELBOW SURGERY Right    ESOPHAGEAL MANOMETRY N/A 09/22/2023   Procedure: MANOMETRY, ESOPHAGUS;  Surgeon: Legrand Victory LITTIE DOUGLAS, MD;  Location: [facility];  Service: Gastroenterology;  Laterality: N/A;   IR FLUORO GUIDE CV LINE RIGHT  02/02/2017   IR FLUORO GUIDE CV LINE RIGHT  11/23/2021   IR FLUORO GUIDE CV LINE RIGHT  11/28/2021   IR REMOVAL TUN CV CATH W/O FL  02/26/2022   IR US  GUIDE VASC ACCESS RIGHT  02/02/2017   IR US  GUIDE VASC ACCESS RIGHT  11/23/2021   PH IMPEDANCE STUDY N/A 09/22/2023   Procedure:  IMPEDANCE PH STUDY, ESOPHAGUS;  Surgeon: Legrand Victory LITTIE DOUGLAS, MD;  Location: [facility];  Service: Gastroenterology;  Laterality: N/A;   UPPER GASTROINTESTINAL ENDOSCOPY     Patient Active Problem List   Diagnosis Date Noted   Foot lesion 09/03/2023   Headache 07/23/2022   Cervical disc disorder with radiculopathy of cervical region 06/20/2020   Seasonal allergic rhinitis 03/05/2020   Chronic left shoulder pain 11/27/2019   GERD (gastroesophageal reflux disease) 08/10/2019   Hypertension associated with diabetes (HCC) 08/10/2019   Insomnia 06/09/2019   PICC (peripherally inserted central catheter) in place 09/05/2018   Mixed diabetic hyperlipidemia associated with type 2 diabetes mellitus (HCC) 02/28/2018   Type 2 diabetes mellitus without complications (HCC) 02/25/2018   Tobacco use disorder 04/13/2017   T.T.P. syndrome (HCC) 02/06/2017   Thrombocytopenia (HCC) 02/01/2017    PCP: Toma Matas, MD  REFERRING PROVIDER: Trudy Duwaine BRAVO, NP  REFERRING DIAG: [phone] (ICD-10-CM) - Radiculopathy, cervical region M25.512,G89.29 (ICD-10-CM) - Chronic left shoulder pain  Rationale for Evaluation and Treatment: Rehabilitation  THERAPY DIAG:  Cervicalgia  Left shoulder pain, unspecified chronicity  Muscle weakness (generalized)  ONSET DATE: acute on chronic, exacerbation beginning ~3 months ago  SUBJECTIVE:  SUBJECTIVE STATEMENT: Patient reports that he had a dental procedure yesterday, has increased pain this morning from this.   EVAL: Pt endorses prior issues with L neck/shoulder that were improving, but about 3 months ago woke up with increased pain. States at one point he was supposed to get a shoulder surgery but was rescheduled d/t his TTP syndrome. He states this exacerbation is not  really worsening since onset but is not improving, has been trying to do exercises on his own and feel like they aggravate it. Less pain with rest, worsens with shoulder elevation and WB, lying on L side. Notes that he will sometimes get L shoulder/pec discomfort with abduction (not flexion) and sidelying/WB - states he has spoken w/ provider about it, denies any cardiac symptoms or red flags. No N/T, no headaches, no speech/swallowing issues, no vision issues. Does endorse difficulty picking up objects with L hand. States he is now requiring assistive devices for self care/hygiene tasks (I.e washing behind back)  We do have 2 referrals, one for back and one for neck/shoulder. He states he would prefer to focus on neck/shoulder as it affects him more, back pain still present but has improved. Mostly provoked by sitting and transfers after sitting.    PERTINENT HISTORY:  DM2, GERD, TTP syndrome, HTN, headaches, insomnia  PAIN:  Are you having pain: 8/10 L neck/shoulder and elbow  Per eval:  Location/description: L neck/shoulder down to elbow, more anteriorly but sometimes posteriorly , throbbing Best-worst over past week: 5-10/10  - aggravating factors: raising arm, reaching behind back, lying on L side - Easing factors: hot water, stretching, icing, resting at side  PRECAUTIONS: TTP syndrome  RED FLAGS: None   WEIGHT BEARING RESTRICTIONS: No  FALLS:  Has patient fallen in last 6 months? No  LIVING ENVIRONMENT: 1 story home, 3 STE BIL rail Lives w/ fiancee, housework split  OCCUPATION: not working since TTP per pt report. Used to work for a company doing heavy lifting, Warden/ranger  PLOF: Independent  PATIENT GOALS: less pain, go back to work, be able to raise arm  NEXT MD VISIT: after PT per pt report   OBJECTIVE:  Note: Objective measures were completed at Evaluation unless otherwise noted.  DIAGNOSTIC FINDINGS:  No recent spine imaging in chart  PATIENT SURVEYS:   NDI: 34/50, 68%  COGNITION: Overall cognitive status: Within functional limits for tasks assessed     SENSATION: LT intact BIL UE   POSTURE: FHP, guarded LUE posture, L UT elevation > R    CERVICAL ROM:    A/PROM  eval AROM 12/03/23  Flexion 55 deg s   Extension 10 deg neck pain   Right lateral flexion 30 deg 42 deg   Left lateral flexion 30 deg * 45 deg painless  Right rotation 35 deg * 58 deg  Left rotation 20 deg * 55 deg   (Blank rows = not tested) (Key: WFL = within functional limits not formally assessed, * = concordant pain, s = stiffness/stretching sensation, NT = not tested)  Comments:     RANGE OF MOTION:      Right eval Left eval  Shoulder flexion WFL A: 124 deg *  Shoulder abduction  A: 96 deg *  Functional ER combo    Functional IR combo    Hip ER    Hip IR    Knee extension    Ankle dorsiflexion     (Blank rows = not tested) (Key: WFL = within functional limits not  formally assessed, * = concordant pain, s = stiffness/stretching sensation, NT = not tested)  Comments:    STRENGTH TESTING:  MMT Right eval Left eval  Shoulder flexion 5 4 **  Shoulder abduction 5 4*  Elbow flexion    Elbow extension    Grip strength  115# 60#  Hip flexion    Hip abduction (modified sitting)    Knee flexion    Knee extension    Ankle dorsiflexion    Ankle plantarflexion     (Blank rows = not tested) (Key: WFL = within functional limits not formally assessed, * = concordant pain, s = stiffness/stretching sensation, NT = not tested)  Comments:     FUNCTIONAL TESTS:  See grip strength and functional reaching above   TREATMENT DATE:  [facility] Adult PT Treatment:                                                DATE: 12/14/23 Therapeutic Exercise: UBE level 1 3'/3' fwd/bwd while gathering subjective info Seated upper trap stretch 2x30 BIL CS flexion/extension AROM x10 CS rotation x10 BIL Grip strength 7lb and 9lb digi flex 2 x 30 sec each Seated CS  retraction 2 x 10 3 sec hold  Neuromuscular re-ed: Cervical sidebending isometrics 3 hold x10 BIL cues for neutral posture and comfortable force output Rows GTB 2x10 - cues for posture Shoulder extension GTB 2x10 - cues for posture Seated BIL ER with scap retraction YTB 2x10  [facility] Adult PT Treatment:                                                DATE: 12/10/23 Therapeutic Exercise: Therabar yellow twisting, smiles/frowns 2x30 ea Seated upper trap stretch 2x30 BIL CS flexion AROM x10 CS rotation x10 BIL Grip strength 5lb and 7lb digi flex x 30 sec each Seated CS retraction 2 x 10 3 sec hold  Neuromuscular re-ed: Cervical sidebending isometrics x10 BIL cues for neutral posture and comfortable force output Rows GTB 2x10 - cues for posture Shoulder extension GTB 2x10 - cues for posture Seated BIL ER with scap retraction YTB 2x10  [facility] Adult PT Treatment:                                                DATE: 12/07/23 Therapeutic Exercise: Therabar yellow twisting, smiles/frowns 2x30 ea CS flexion AROM x12 CS sidebending AROM x12 BIL CS rotation x12 BIL 5# BIL shrugs 2x12 Grip strength 7lb digi flex x 30 sec each Seated CS retraction 2 x 10 3 sec hold  Manual Therapy: Seated STM and gentle trigger point release L UT, LS, rhomboids, deltoid Neuromuscular re-ed: Cervical sidebending isometrics 2x5 cues for neutral posture and comfortable force output Cervical extension isometrics 2x5 cues for neutral posture and comfortable force output Self Care: Theracane self instruction, MTPR, tack and stretch    PATIENT EDUCATION:  Education details: rationale for interventions, HEP  Person educated: Patient Education method: Explanation, Demonstration, Tactile cues, Verbal cues Education comprehension: verbalized understanding, returned demonstration, verbal cues required, tactile cues required, and needs further education  HOME EXERCISE PROGRAM: Access Code: [email or web address] URL:  [email or web address] Date: 11/24/2023 Prepared by: Marko Molt  Exercises - Supine Cervical Rotation AROM on Pillow  - 1 x daily - 7 x weekly - 2-3 sets - 10 reps - Supine Cervical Retraction with Towel  - 1 x daily - 7 x weekly - 2-3 sets - 10 reps - 3 sec hold - Seated Scapular Retraction  - 1 x daily - 7 x weekly - 2-3 sets - 10 reps - 3 sec hold - Corner Pec Minor Stretch  - 1 x daily - 7 x weekly - 2-3 sets - 30 sec hold - Standing Pec Stretch at Wall  - 1 x daily - 7 x weekly - 2-3 sets - 30 sec hold  ASSESSMENT:  CLINICAL IMPRESSION: Patient presents to PT reporting no change in symptoms, is having increased pain today due to having a dental procedure yesterday. Increased some challenge of exercises today to moderate effect, no increase in pain, continued baseline pain. Patient was able to tolerate all prescribed exercises with no adverse effects. Patient continues to benefit from skilled PT services and should be progressed as able to improve functional independence.   EVAL: Patient is a 48 y.o. gentleman who was seen today for physical therapy evaluation and treatment for neck/shoulder pain- also has referral for back but he states he would prefer to focus on the former as it is affecting daily activities more prominently. No red flags today - he is inquisitive re: some L anterior shoulder/pec pain, only provoked by WB through shoulder and abduction, denies any cardiac symptoms or red flags. On exam he demonstrates concordant limitations in shoulder/cervical mobility, GH/grip strength. Tolerates exam well overall, deferring HEP w/ time constraints. No adverse events. Recommend trial of skilled PT to address aforementioned deficits with aim of improving functional tolerance and reducing pain with typical activities. Pt departs today's session in no acute distress, all voiced concerns/questions addressed appropriately from PT perspective.    OBJECTIVE IMPAIRMENTS: decreased  activity tolerance, decreased endurance, decreased mobility, decreased ROM, decreased strength, impaired perceived functional ability, impaired UE functional use, and pain.   ACTIVITY LIMITATIONS: carrying, lifting, sitting, sleeping, bathing, dressing, reach over head, and hygiene/grooming  PARTICIPATION LIMITATIONS: meal prep, cleaning, laundry, community activity, and occupation  PERSONAL FACTORS: Time since onset of injury/illness/exacerbation and 3+ comorbidities: DM2, GERD, TTP syndrome, HTN, headaches, insomnia are also affecting patient's functional outcome.   REHAB POTENTIAL: Good  CLINICAL DECISION MAKING: Evolving/moderate complexity  EVALUATION COMPLEXITY: Moderate   GOALS:   SHORT TERM GOALS: Target date: 12/07/2023  Pt will demonstrate appropriate understanding and performance of initially prescribed HEP in order to facilitate improved independence with management of symptoms.  Baseline: HEP TBD  Goal status: MET Pt reports adherence 12/07/23   2. Pt will report at least 25% improvement in overall pain levels over past week in order to facilitate improved tolerance to typical daily activities.   Baseline: 5-10/10  Goal status: Not progressing  Pt reports no change in pain 12/07/23   LONG TERM GOALS: Target date: 12/28/2023  Pt will score less than or equal to 20/50 (40%) on NDI in order to demonstrate improved perception of function due to symptoms ([facility] 10-13 pts per Neysa dunker al 2009, 2010). Baseline: 34/50; 68% Goal status: INITIAL  2. Pt will demonstrate at least 50 degrees of active cervical rotation ROM bilaterally in order to demonstrate improved environmental awareness and safety with driving.  Baseline: see ROM chart above Goal status: INITIAL  3. Pt will demonstrate at least 4+/5 shoulder flex/abd MMT BIL for improved symmetry of UE strength and improved tolerance to functional movements.  Baseline: see MMT chart above Goal status: INITIAL   4. Pt will  report ability to perform usual self care/hygiene tasks with integration of BIL UE for improved functional tolerance.  Baseline: reports relying solely on RUE for washing back Goal status: INITIAL   5. Pt will report at least 50% decrease in overall pain levels in past week in order to facilitate improved tolerance to basic ADLs/mobility.   Baseline: 5-10/10  Goal status: INITIAL    PLAN:  PT FREQUENCY: 2x/week  PT DURATION: 6 weeks  PLANNED INTERVENTIONS: [id number]- PT Re-evaluation, 97750- Physical Performance Testing, 97110-Therapeutic exercises, 97530- Therapeutic activity, 97112- Neuromuscular re-education, 97535- Self Care, [id number]- Manual therapy, Patient/Family education, Balance training, Taping, Joint mobilization, Spinal mobilization, Cryotherapy, and Moist heat.  PLAN FOR NEXT SESSION: establish HEP. Work on gentle GH/periscapular strengthening, GH mobility, grip strengthening. Symptom modification strategies as indicated/appropriate, mindful of TTP syndrome.   Corean Pouch PTA  12/14/2023 10:37 AM

## 2023-12-16 ENCOUNTER — Encounter

## 2023-12-16 ENCOUNTER — Ambulatory Visit

## 2023-12-16 DIAGNOSIS — R0789 Other chest pain: Secondary | ICD-10-CM

## 2023-12-16 DIAGNOSIS — M533 Sacrococcygeal disorders, not elsewhere classified: Secondary | ICD-10-CM | POA: Diagnosis not present

## 2023-12-16 DIAGNOSIS — M461 Sacroiliitis, not elsewhere classified: Secondary | ICD-10-CM | POA: Diagnosis not present

## 2023-12-16 DIAGNOSIS — M7918 Myalgia, other site: Secondary | ICD-10-CM | POA: Diagnosis not present

## 2023-12-16 DIAGNOSIS — M47816 Spondylosis without myelopathy or radiculopathy, lumbar region: Secondary | ICD-10-CM | POA: Diagnosis not present

## 2023-12-16 DIAGNOSIS — R12 Heartburn: Secondary | ICD-10-CM

## 2023-12-20 ENCOUNTER — Ambulatory Visit
Admission: RE | Admit: 2023-12-20 | Discharge: 2023-12-20 | Disposition: A | Discharge status: Home / Self Care | Source: Ambulatory Visit | Attending: Allergy & Immunology | Admitting: Allergy & Immunology

## 2023-12-20 DIAGNOSIS — J329 Chronic sinusitis, unspecified: Secondary | ICD-10-CM | POA: Diagnosis not present

## 2023-12-20 DIAGNOSIS — J3489 Other specified disorders of nose and nasal sinuses: Secondary | ICD-10-CM

## 2023-12-20 DIAGNOSIS — J310 Chronic rhinitis: Secondary | ICD-10-CM

## 2023-12-21 ENCOUNTER — Ambulatory Visit

## 2023-12-21 DIAGNOSIS — M542 Cervicalgia: Secondary | ICD-10-CM

## 2023-12-21 DIAGNOSIS — M6281 Muscle weakness (generalized): Secondary | ICD-10-CM

## 2023-12-21 DIAGNOSIS — M25512 Pain in left shoulder: Secondary | ICD-10-CM

## 2023-12-21 NOTE — Therapy (Signed)
 OUTPATIENT PHYSICAL THERAPY DISCHARGE   Patient Name: Angelos Wasco MRN: [id number] DOB:1975-07-16, 48 y.o., male Today's Date: 12/21/2023   PHYSICAL THERAPY DISCHARGE SUMMARY  Visits from Start of Care: 8   Current functional level related to goals / functional outcomes: See objective findings/assessment   Remaining deficits: See objective findings/assessment    Education / Equipment: See today's treatment/assessment      Patient agrees to discharge. Patient goals were not met. Patient is being discharged due to maximized rehab potential.     END OF SESSION:  PT End of Session - 12/21/23 1012     Visit Number 8    Number of Visits 13    Date for PT Re-Evaluation 12/28/23    Authorization Type [facility] [employer] [facility]    Authorization Time Period auth after 27 visits per appt notes    PT Start Time 1009    PT Stop Time 1047    PT Time Calculation (min) 38 min    Activity Tolerance Patient limited by pain;Patient tolerated treatment well    Behavior During Therapy [facility] for tasks assessed/performed           Past Medical History:  Diagnosis Date   At risk for dysfunction of heart 07/11/2018   Chest wall pain 08/14/2018   Controlled diabetes mellitus type 2 with complications (HCC) 02/25/2018   Diabetes mellitus (HCC) 02/01/2017   Diabetes mellitus without complication (HCC)    non-insulin  dependent   Diverticulosis 02/01/2017   on CT scan abd/pelvis   Eczema    GERD (gastroesophageal reflux disease)    Hypertension    Kidney stones 02/03/2017   Leg swelling 05/12/2017   Macrocytic anemia 02/01/2017   Microscopic hematuria 02/01/2017   Mixed hyperlipidemia 02/28/2018   Phlebitis 05/12/2017   Recurrent upper respiratory infection (URI)    Renal insufficiency 02/03/2017   Smoker 04/13/2017   Past Surgical History:  Procedure Laterality Date   COLONOSCOPY     ELBOW SURGERY Right    ESOPHAGEAL MANOMETRY N/A 09/22/2023   Procedure: MANOMETRY, ESOPHAGUS;   Surgeon: Legrand Victory LITTIE DOUGLAS, MD;  Location: [facility];  Service: Gastroenterology;  Laterality: N/A;   IR FLUORO GUIDE CV LINE RIGHT  02/02/2017   IR FLUORO GUIDE CV LINE RIGHT  11/23/2021   IR FLUORO GUIDE CV LINE RIGHT  11/28/2021   IR REMOVAL TUN CV CATH W/O FL  02/26/2022   IR US  GUIDE VASC ACCESS RIGHT  02/02/2017   IR US  GUIDE VASC ACCESS RIGHT  11/23/2021   PH IMPEDANCE STUDY N/A 09/22/2023   Procedure: IMPEDANCE PH STUDY, ESOPHAGUS;  Surgeon: Legrand Victory LITTIE DOUGLAS, MD;  Location: [facility];  Service: Gastroenterology;  Laterality: N/A;   UPPER GASTROINTESTINAL ENDOSCOPY     Patient Active Problem List   Diagnosis Date Noted   Heartburn 12/16/2023   Atypical chest pain 12/16/2023   Foot lesion 09/03/2023   Headache 07/23/2022   Cervical disc disorder with radiculopathy of cervical region 06/20/2020   Seasonal allergic rhinitis 03/05/2020   Chronic left shoulder pain 11/27/2019   GERD (gastroesophageal reflux disease) 08/10/2019   Hypertension associated with diabetes (HCC) 08/10/2019   Insomnia 06/09/2019   PICC (peripherally inserted central catheter) in place 09/05/2018   Mixed diabetic hyperlipidemia associated with type 2 diabetes mellitus (HCC) 02/28/2018   Type 2 diabetes mellitus without complications (HCC) 02/25/2018   Tobacco use disorder 04/13/2017   T.T.P. syndrome (HCC) 02/06/2017   Thrombocytopenia (HCC) 02/01/2017    PCP: Toma Matas, MD  REFERRING PROVIDER:  Trudy Duwaine BRAVO, NP  REFERRING DIAG: [phone] (ICD-10-CM) - Radiculopathy, cervical region M25.512,G89.29 (ICD-10-CM) - Chronic left shoulder pain  Rationale for Evaluation and Treatment: Rehabilitation  THERAPY DIAG:  Cervicalgia  Muscle weakness (generalized)  Left shoulder pain, unspecified chronicity  ONSET DATE: acute on chronic, exacerbation beginning ~3 months ago  SUBJECTIVE:                                                                                                                                                                                            SUBJECTIVE STATEMENT: Patient reports that he has more pain all overall.   EVAL: Pt endorses prior issues with L neck/shoulder that were improving, but about 3 months ago woke up with increased pain. States at one point he was supposed to get a shoulder surgery but was rescheduled d/t his TTP syndrome. He states this exacerbation is not really worsening since onset but is not improving, has been trying to do exercises on his own and feel like they aggravate it. Less pain with rest, worsens with shoulder elevation and WB, lying on L side. Notes that he will sometimes get L shoulder/pec discomfort with abduction (not flexion) and sidelying/WB - states he has spoken w/ provider about it, denies any cardiac symptoms or red flags. No N/T, no headaches, no speech/swallowing issues, no vision issues. Does endorse difficulty picking up objects with L hand. States he is now requiring assistive devices for self care/hygiene tasks (I.e washing behind back)  We do have 2 referrals, one for back and one for neck/shoulder. He states he would prefer to focus on neck/shoulder as it affects him more, back pain still present but has improved. Mostly provoked by sitting and transfers after sitting.    PERTINENT HISTORY:  DM2, GERD, TTP syndrome, HTN, headaches, insomnia  PAIN:  Are you having pain: 8/10 L neck/shoulder and elbow  Per eval:  Location/description: L neck/shoulder down to elbow, more anteriorly but sometimes posteriorly , throbbing Best-worst over past week: 5-10/10  - aggravating factors: raising arm, reaching behind back, lying on L side - Easing factors: hot water, stretching, icing, resting at side  PRECAUTIONS: TTP syndrome  RED FLAGS: None   WEIGHT BEARING RESTRICTIONS: No  FALLS:  Has patient fallen in last 6 months? No  LIVING ENVIRONMENT: 1 story home, 3 STE BIL rail Lives w/ fiancee, housework  split  OCCUPATION: not working since TTP per pt report. Used to work for a company doing heavy lifting, Warden/ranger  PLOF: Independent  PATIENT GOALS: less pain, go back to work, be able to raise arm  NEXT MD VISIT: after PT per  pt report   OBJECTIVE:  Note: Objective measures were completed at Evaluation unless otherwise noted.  DIAGNOSTIC FINDINGS:  No recent spine imaging in chart  PATIENT SURVEYS:  NDI: 34/50, 68%  COGNITION: Overall cognitive status: Within functional limits for tasks assessed     SENSATION: LT intact BIL UE   POSTURE: FHP, guarded LUE posture, L UT elevation > R    CERVICAL ROM:    A/PROM  eval AROM 12/03/23 AROM 12/21/23  Flexion 55 deg s    Extension 10 deg neck pain  P!  Right lateral flexion 30 deg 42 deg    Left lateral flexion 30 deg * 45 deg painless   Right rotation 35 deg * 58 deg WFL  Left rotation 20 deg * 55 deg WFL   (Blank rows = not tested) (Key: WFL = within functional limits not formally assessed, * = concordant pain, s = stiffness/stretching sensation, NT = not tested)  Comments:     RANGE OF MOTION:      Right eval Left eval  Shoulder flexion WFL A: 124 deg *  Shoulder abduction  A: 96 deg *  Functional ER combo    Functional IR combo    Hip ER    Hip IR    Knee extension    Ankle dorsiflexion     (Blank rows = not tested) (Key: WFL = within functional limits not formally assessed, * = concordant pain, s = stiffness/stretching sensation, NT = not tested)  Comments:    STRENGTH TESTING:  MMT Right eval Left eval Right 12/21/2023 Left  12/21/2023   Shoulder flexion 5 4 **  4  Shoulder abduction 5 4*  4  Elbow flexion      Elbow extension      Grip strength  115# 60# 90# 80#  Hip flexion      Hip abduction (modified sitting)      Knee flexion      Knee extension      Ankle dorsiflexion      Ankle plantarflexion       (Blank rows = not tested) (Key: WFL = within functional limits not formally  assessed, * = concordant pain, s = stiffness/stretching sensation, NT = not tested)  Comments:     FUNCTIONAL TESTS:  See grip strength and functional reaching above   TREATMENT DATE:   [facility] Adult PT Treatment:                                                DATE: 12/21/2023  Therapeutic Exercise: UBE level 1 3'/3' fwd/bwd while gathering subjective info NuStep level x 5 minutes, concurrent with MHP to low back   Therapeutic Activity:  Reassessment of objective measures and subjective assessment regarding progress towards established goals and plan for independence with prescribed home program following discharged from PT      PATIENT EDUCATION:  Education details: rationale for interventions, HEP  Person educated: Patient Education method: Explanation, Demonstration, Tactile cues, Verbal cues Education comprehension: verbalized understanding, returned demonstration, verbal cues required, tactile cues required, and needs further education     HOME EXERCISE PROGRAM: Access Code: [email or web address] URL: [email or web address] Date: 11/24/2023 Prepared by: Marko Molt  Exercises - Supine Cervical Rotation AROM on Pillow  - 1 x daily - 7 x weekly - 2-3 sets - 10 reps - Supine Cervical Retraction  with Towel  - 1 x daily - 7 x weekly - 2-3 sets - 10 reps - 3 sec hold - Seated Scapular Retraction  - 1 x daily - 7 x weekly - 2-3 sets - 10 reps - 3 sec hold - Corner Pec Minor Stretch  - 1 x daily - 7 x weekly - 2-3 sets - 30 sec hold - Standing Pec Stretch at Wall  - 1 x daily - 7 x weekly - 2-3 sets - 30 sec hold  ASSESSMENT:  CLINICAL IMPRESSION: Nickalas has attended 7 visits since initial evaluation. Patient has made good progress with CS AROM and L grip strength. Patient has met <50% of their goals. They continue to have limited UE strength and persistent pain with daily activities, and should continue with prescribed home program. Patient will be discharged from skilled PT  at this time and should follow up with referring provider as needed. We plan to evaluate LBP when patient returns as related to referral from PCP.    OBJECTIVE IMPAIRMENTS: decreased activity tolerance, decreased endurance, decreased mobility, decreased ROM, decreased strength, impaired perceived functional ability, impaired UE functional use, and pain.   ACTIVITY LIMITATIONS: carrying, lifting, sitting, sleeping, bathing, dressing, reach over head, and hygiene/grooming  PARTICIPATION LIMITATIONS: meal prep, cleaning, laundry, community activity, and occupation  PERSONAL FACTORS: Time since onset of injury/illness/exacerbation and 3+ comorbidities: DM2, GERD, TTP syndrome, HTN, headaches, insomnia are also affecting patient's functional outcome.   REHAB POTENTIAL: Good  CLINICAL DECISION MAKING: Evolving/moderate complexity  EVALUATION COMPLEXITY: Moderate   GOALS:   SHORT TERM GOALS: Target date: 12/07/2023  Pt will demonstrate appropriate understanding and performance of initially prescribed HEP in order to facilitate improved independence with management of symptoms.  Baseline: HEP TBD  Goal status: MET Pt reports adherence 12/07/23   2. Pt will report at least 25% improvement in overall pain levels over past week in order to facilitate improved tolerance to typical daily activities.   Baseline: 5-10/10  12/21/23: 10% improvement   Goal status: Not Met     LONG TERM GOALS: Target date: 12/28/2023  Pt will score less than or equal to 20/50 (40%) on NDI in order to demonstrate improved perception of function due to symptoms ([facility] 10-13 pts per Neysa dunker al 2009, 2010). Baseline: 34/50; 68% 12/21/23: 28/50; 56% Goal status: Not MET  2. Pt will demonstrate at least 50 degrees of active cervical rotation ROM bilaterally in order to demonstrate improved environmental awareness and safety with driving.  Baseline: see ROM chart above Goal status: MET  3. Pt will demonstrate at least  4+/5 shoulder flex/abd MMT BIL for improved symmetry of UE strength and improved tolerance to functional movements.  Baseline: see MMT chart above Goal status: Not MET  4. Pt will report ability to perform usual self care/hygiene tasks with integration of BIL UE for improved functional tolerance.  Baseline: reports relying solely on RUE for washing back Goal status: Not Met; unable to reach behind back.   5. Pt will report at least 50% decrease in overall pain levels in past week in order to facilitate improved tolerance to basic ADLs/mobility.   Baseline: 5-10/10  12/21/2023: 10% improvement per patient report   Goal status: Not Met  PLAN:  PT FREQUENCY: 2x/week  PT DURATION: 6 weeks  PLANNED INTERVENTIONS: [id number]- PT Re-evaluation, 97750- Physical Performance Testing, 97110-Therapeutic exercises, 97530- Therapeutic activity, 97112- Neuromuscular re-education, 97535- Self Care, [id number]- Manual therapy, Patient/Family education, Balance training, Taping, Joint mobilization, Spinal mobilization,  Cryotherapy, and Moist heat.    Marko Molt, PT, DPT  12/21/2023 11:41 AM

## 2023-12-22 ENCOUNTER — Ambulatory Visit: Payer: Self-pay

## 2023-12-22 ENCOUNTER — Other Ambulatory Visit: Payer: Self-pay | Admitting: Allergy & Immunology

## 2023-12-22 ENCOUNTER — Other Ambulatory Visit: Payer: Self-pay

## 2023-12-22 ENCOUNTER — Ambulatory Visit

## 2023-12-22 VITALS — BP 135/75 | HR 77 | Temp 97.6°F | Ht 70.0 in | Wt 181.0 lb

## 2023-12-22 DIAGNOSIS — G8929 Other chronic pain: Secondary | ICD-10-CM | POA: Diagnosis not present

## 2023-12-22 DIAGNOSIS — M5416 Radiculopathy, lumbar region: Secondary | ICD-10-CM

## 2023-12-22 DIAGNOSIS — R634 Abnormal weight loss: Secondary | ICD-10-CM

## 2023-12-22 DIAGNOSIS — M6281 Muscle weakness (generalized): Secondary | ICD-10-CM

## 2023-12-22 DIAGNOSIS — M5442 Lumbago with sciatica, left side: Secondary | ICD-10-CM

## 2023-12-22 DIAGNOSIS — M542 Cervicalgia: Secondary | ICD-10-CM | POA: Diagnosis not present

## 2023-12-22 DIAGNOSIS — K5900 Constipation, unspecified: Secondary | ICD-10-CM

## 2023-12-22 MED ORDER — POLYETHYLENE GLYCOL 3350 17 GM/SCOOP PO POWD: 17.0000 g | Freq: Every day | ORAL | 0 refills | Status: AC | PRN

## 2023-12-22 MED ORDER — SENNA 8.6 MG PO TABS: 1.0000 | ORAL_TABLET | Freq: Every day | ORAL | 0 refills | Status: AC

## 2023-12-22 MED ORDER — GABAPENTIN 100 MG PO CAPS: 100.0000 mg | ORAL_CAPSULE | Freq: Three times a day (TID) | ORAL | 1 refills | Status: DC

## 2023-12-22 NOTE — Therapy (Signed)
 OUTPATIENT PHYSICAL THERAPY THORACOLUMBAR EVALUATION   Patient Name: Eric Hooper MRN: [id number] DOB:03-07-1976, 48 y.o., male Today's Date: 12/22/2023  END OF SESSION:  Visit Number 1 Number of Visits 12 Date for PT re-eval 02/02/2024  Authorization Type [employer] Authorization Visit Number 9 Authorization Number of Visits 27  PT start time 1225 PT stop time 1300 PT time calculation (min) 35 min     Past Medical History:  Diagnosis Date   At risk for dysfunction of heart 07/11/2018   Chest wall pain 08/14/2018   Controlled diabetes mellitus type 2 with complications (HCC) 02/25/2018   Diabetes mellitus (HCC) 02/01/2017   Diabetes mellitus without complication (HCC)    non-insulin  dependent   Diverticulosis 02/01/2017   on CT scan abd/pelvis   Eczema    GERD (gastroesophageal reflux disease)    Hypertension    Kidney stones 02/03/2017   Leg swelling 05/12/2017   Macrocytic anemia 02/01/2017   Microscopic hematuria 02/01/2017   Mixed hyperlipidemia 02/28/2018   Phlebitis 05/12/2017   Recurrent upper respiratory infection (URI)    Renal insufficiency 02/03/2017   Smoker 04/13/2017   Past Surgical History:  Procedure Laterality Date   COLONOSCOPY     ELBOW SURGERY Right    ESOPHAGEAL MANOMETRY N/A 09/22/2023   Procedure: MANOMETRY, ESOPHAGUS;  Surgeon: Legrand Victory LITTIE DOUGLAS, MD;  Location: [facility];  Service: Gastroenterology;  Laterality: N/A;   IR FLUORO GUIDE CV LINE RIGHT  02/02/2017   IR FLUORO GUIDE CV LINE RIGHT  11/23/2021   IR FLUORO GUIDE CV LINE RIGHT  11/28/2021   IR REMOVAL TUN CV CATH W/O FL  02/26/2022   IR US  GUIDE VASC ACCESS RIGHT  02/02/2017   IR US  GUIDE VASC ACCESS RIGHT  11/23/2021   PH IMPEDANCE STUDY N/A 09/22/2023   Procedure: IMPEDANCE PH STUDY, ESOPHAGUS;  Surgeon: Legrand Victory LITTIE DOUGLAS, MD;  Location: [facility];  Service: Gastroenterology;  Laterality: N/A;   UPPER GASTROINTESTINAL ENDOSCOPY     Patient Active  Problem List   Diagnosis Date Noted   Heartburn 12/16/2023   Atypical chest pain 12/16/2023   Foot lesion 09/03/2023   Headache 07/23/2022   Cervical disc disorder with radiculopathy of cervical region 06/20/2020   Seasonal allergic rhinitis 03/05/2020   Chronic left shoulder pain 11/27/2019   GERD (gastroesophageal reflux disease) 08/10/2019   Hypertension associated with diabetes (HCC) 08/10/2019   Insomnia 06/09/2019   PICC (peripherally inserted central catheter) in place 09/05/2018   Mixed diabetic hyperlipidemia associated with type 2 diabetes mellitus (HCC) 02/28/2018   Type 2 diabetes mellitus without complications (HCC) 02/25/2018   Tobacco use disorder 04/13/2017   T.T.P. syndrome (HCC) 02/06/2017   Thrombocytopenia (HCC) 02/01/2017    PCP: Toma Matas, MD  REFERRING PROVIDER: Romelle Booty, MD; attestation by Donah Laymon PARAS, MD   REFERRING DIAG:  M54.50 (ICD-10-CM) - Low back pain without sciatica, unspecified back pain laterality, unspecified chronicity    Rationale for Evaluation and Treatment: Rehabilitation  THERAPY DIAG:  Radiculopathy, lumbar region  Muscle weakness (generalized)  ONSET DATE: 11/05/2023 date of referral   SUBJECTIVE:  SUBJECTIVE STATEMENT: Patient reports to PT with low back pain and R hip pain that has been present for a few months. It has significantly worsening over the past week. He did fall yesterday when standing up from the bed, and his leg gave out.   PERTINENT HISTORY:  Relevant PMHx includes DM type 2, Diverticulosis (2018), HTN, Renal insufficiency, Chronic L shoulder and cervical pain, TTP Syndrome, Thrombocytopenia   PAIN:  Are you having pain?  Yes: NPRS scale: 9/10 current Pain location: L hip  Pain description: nerve pain,  pulling  Aggravating factors: sitting Relieving factors: walking   PRECAUTIONS: None  RED FLAGS: None   WEIGHT BEARING RESTRICTIONS: No  FALLS:  Has patient fallen in last 6 months? Yes; yesterday, my leg gave out on me when I was standing up from the bed in the morning.     LIVING ENVIRONMENT: 1 story home, 3 STE BIL rail Lives w/ fiancee, housework split   OCCUPATION: not working since TTP per pt report. Used to work for a company doing heavy lifting, Warden/ranger   PLOF: Independent   PATIENT GOALS: less pain, go back to work if possible    NEXT MD VISIT: after PT per pt report    OBJECTIVE:  Note: Objective measures were completed at Evaluation unless otherwise noted.   PATIENT SURVEYS:  Modified Oswestry:  MODIFIED OSWESTRY DISABILITY SCALE  Date: 12/22/2023 Score  Pain intensity 1 = The pain is bad, but I can manage without having to take (1) I can stand as long as I want but, it increases my pain. pain medication.  2. Personal care (washing, dressing, etc.) 1 =  I can take care of myself normally, but it increases my pain.  3. Lifting 0 = I can lift heavy weights without increased pain.  4. Walking 0 = Pain does not prevent me from walking any distance  5. Sitting 4 =  Pain prevents me from sitting more than 10 minutes.  6. Standing 2 =  Pain prevents me from standing more than 1 hour  7. Sleeping 2 =  Even when I take pain medication, I sleep less than 6 hours  8. Social Life 1 =  My social life is normal, but it increases my level of pain.  9. Traveling 1 =  I can travel anywhere, but it increases my pain.  10. Employment/ Homemaking 1 = My normal homemaking/job activities increase my pain, but I can still perform all that is required of me  Total 13/50   Interpretation of scores: Score Category Description  0-20% Minimal Disability The patient can cope with most living activities. Usually no treatment is indicated apart from advice on lifting, sitting  and exercise  21-40% Moderate Disability The patient experiences more pain and difficulty with sitting, lifting and standing. Travel and social life are more difficult and they may be disabled from work. Personal care, sexual activity and sleeping are not grossly affected, and the patient can usually be managed by conservative means  41-60% Severe Disability Pain remains the main problem in this group, but activities of daily living are affected. These patients require a detailed investigation  61-80% Crippled Back pain impinges on all aspects of the patient's life. Positive intervention is required  81-100% Bed-bound  These patients are either bed-bound or exaggerating their symptoms  Bluford FORBES Zoe DELENA Karon DELENA, et al. Surgery versus conservative management of stable thoracolumbar fracture: the PRESTO feasibility RCT. [place] ([place]): [employer]; 2021 Nov. (  Health Technology Assessment, No. 25.62.) Appendix 3, Oswestry Disability Index category descriptors. Available from: FindJewel[employer].cz  Minimally Clinically Important Difference (MCID) = 12.8%  COGNITION: Overall cognitive status: Within functional limits for tasks assessed     MUSCLE LENGTH: Hamstrings: Right 75 deg; Left 30 deg  LUMBAR ROM: deferred d/t high irritability at time of evaluation   AROM eval  Flexion   Extension   Right lateral flexion   Left lateral flexion   Right rotation   Left rotation    (Blank rows = not tested)  LOWER EXTREMITY ROM:     Active  Right eval Left eval  Hip flexion    Hip extension    Hip abduction    Hip adduction    Hip internal rotation    Hip external rotation    Knee flexion    Knee extension    Ankle dorsiflexion    Ankle plantarflexion    Ankle inversion    Ankle eversion     (Blank rows = not tested)  LOWER EXTREMITY MMT:    MMT Right eval Left eval  Hip flexion 4 4  Hip extension    Hip abduction 4+ 4-  Hip adduction     Hip internal rotation    Hip external rotation    Knee flexion    Knee extension 4+ 4+  Ankle dorsiflexion    Ankle plantarflexion    Ankle inversion    Ankle eversion     (Blank rows = not tested)  LUMBAR SPECIAL TESTS:  Straight leg raise test: Positive and Slump test: Positive  TREATMENT DATE:   [facility] Adult PT Treatment:                                                DATE: 12/22/2023   Initial evaluation: see patient education and home exercise program as noted below                                                                                                                                   PATIENT EDUCATION:  Education details: reviewed initial home exercise program; discussion of POC, prognosis and goals for skilled PT   Person educated: Patient Education method: Explanation Education comprehension: verbalized understanding, returned demonstration, and needs further education  HOME EXERCISE PROGRAM: Access Code: [facility] URL: [email or web address] Date: 12/22/2023 Prepared by: Marko Molt  Exercises - Lying Prone  - 2 x daily - 7 x weekly - 2 sets - 10 reps - 3 sec hold - Static Prone on Elbows  - 2 x daily - 7 x weekly - 4 sets - 10 sec hold - Seated Thoracic Lumbar Extension with Pectoralis Stretch  - 2 x daily - 7 x weekly - 2 sets - 10 reps - 3 sec hold - Standing  Lumbar Extension with Counter  - 2 x daily - 7 x weekly - 2 sets - 5 reps - 3 sec hold  ASSESSMENT:  CLINICAL IMPRESSION: Roran is a 48 y.o. male who was seen today for physical therapy evaluation and treatment for persistent low back pain with radicular symptoms. He is demonstrating high symptom irritability with prolonged sitting, decreased lower extremity MMT scores, improved severity of symptoms/centralization of symptoms with thoracolumbar extension exercises. He requires skilled PT services at this time to address relevant deficits and improve overall function.     OBJECTIVE  IMPAIRMENTS: decreased activity tolerance, decreased ROM, decreased strength, improper body mechanics, and pain.   ACTIVITY LIMITATIONS: carrying, lifting, bending, sitting, and squatting  PARTICIPATION LIMITATIONS: meal prep, cleaning, driving, shopping, community activity, and occupation  PERSONAL FACTORS: Past/current experiences, Profession, Time since onset of injury/illness/exacerbation, and 3+ comorbidities: Relevant PMHx includes DM type 2, Diverticulosis (2018), HTN, Renal insufficiency, Chronic L shoulder and cervical pain, TTP Syndrome, Thrombocytopenia  are also affecting patient's functional outcome.   REHAB POTENTIAL: Fair    CLINICAL DECISION MAKING: Evolving/moderate complexity  EVALUATION COMPLEXITY: Moderate   GOALS: Goals reviewed with patient? YES  SHORT TERM GOALS: Target date: 01/12/2024    Patient will be independent with initial home program at least 3 days/week.  Baseline: provided at eval Goal Status: INITIAL    2.  Patient will demonstrate ability to tolerate at least 50% lumbar AROM in all directions Baseline: Unable to tolerate due to severity of symptoms and high irritability with LS motion Goal status: INITIAL   LONG TERM GOALS: Target date: 02/02/2024    Patient will report improved overall functional ability with ODI score of 5/50 or less.  Baseline: 13/50 Goal Status: INITIAL   2.  Patient will demonstrate improved LE MMT to at least 4+/5 BIL.  Baseline: see MMT chart above  Goal status: INITIAL  3.  Patient will demonstrate ability to tolerate at least 50% lumbar AROM in all directions Baseline: Unable to tolerate due to severity of symptoms and high irritability with LS motion Goal status: INITIAL  4.  Patient will demonstrate ability to perform floor to waist lifting of at least 25# using appropriate body mechanics and with no more than minimal pain in order to safely perform normal daily/occupational tasks.   Baseline: unable to  tolerate   Goal Status: INITIAL   5. Patient will report ability to tolerate sitting >30 minutes without exacerbation of LQ pain.  Baseline: Unable to tolerate; patient reports severe pain immediately with seated positions Goal Status: INITIAL    PLAN:  PT FREQUENCY: 1-2x/week  PT DURATION: 6 weeks  PLANNED INTERVENTIONS: 97164- PT Re-evaluation, 97750- Physical Performance Testing, 97110-Therapeutic exercises, 97530- Therapeutic activity, [id number]- Neuromuscular re-education, 97535- Self Care, [id number]- Manual therapy, [id number]- Aquatic Therapy, [id number]- Electrical stimulation (unattended), 20560 (1-2 muscles), 20561 (3+ muscles)- Dry Needling, Patient/Family education, Taping, Spinal mobilization, Cryotherapy, and Moist heat.  PLAN FOR NEXT SESSION: continue with LS extension focus for centralization of symptoms; trial Nustep for low-impact aerobic activity to elicit pain modulation effects; consider thermal modalities and manual therapy (incl grade !/!! Mob) as indicated; otherwise progress towards established goals as tolerated    Marko Molt, PT, DPT  12/26/2023 9:24 PM

## 2023-12-22 NOTE — Patient Instructions (Signed)
   It was great to see you!  Our plans for today:  - Gabapentin  100 mg TID for nerve pain - Will pan on get GTT and Vit D blood test next visit - Miralax  for constipation take 17 g daily - Return in 2 months    Take care and seek immediate care sooner if you develop any concerns.       [name] HAS PGY 1 Family Medicine Resident [facility]  [address] [place], [place] [address] Fax [phone] Phone [phone] 12/22/2023, 9:24 AM

## 2023-12-22 NOTE — Progress Notes (Signed)
    SUBJECTIVE:   CHIEF COMPLAINT / HPI: Hip pain, weight lose follow up  [name] is a 48 YO male present today for f/u with his weight lose. Patient bought in the log with him which shows stable weight since last month. Patient states he has been eating better and have been controlling his diet but is not expected weight lose. He has been experiencing constipation which he has been taking fiber. Denies dizziness, weakness, blood in stool, or change in appetite.   In addition, he is c/o his worsening chronic hip pain state 8/10 pain which his pain radiate down to his left leg. Pain does not improved w/ tylenol  or flexeril. With his PMH of Thrombocytopenia - he is unable to take NSAID. He has a spine surgery appointment on 01/03/24 for possible injection   PERTINENT  PMH / PSH:  Thrombocytopenia TTP GERD HTN DM II HLD Chronic back and Shoulder pain    OBJECTIVE:   BP 135/75   Pulse 77   Temp 97.6 F (36.4 C)   Ht 5' 10 (1.778 m)   Wt 181 lb (82.1 kg)   SpO2 98%   BMI 25.97 kg/m   Physical Exam Cardiovascular:     Rate and Rhythm: Normal rate.     Pulses: Normal pulses.  Pulmonary:     Effort: Pulmonary effort is normal.     Breath sounds: Normal breath sounds.  Abdominal:     Palpations: Abdomen is soft.  Musculoskeletal:     Lumbar back: Tenderness (left PSIS and gluteal region) present.      ASSESSMENT/PLAN:   Assessment & Plan Loss of weight Stable weight from his log - Continue to monitor - If continue with weight lose will get Thyroid study during next blood drawn  Chronic left-sided low back pain with left-sided sciatica Elevated Alk-Phos, State his left leg gave up result in Fall yesterday. Pain has not been improved with Tylenol  or Flexeril. Unable to take NSAID.  - Plan on GGT for next blood testing if elevate will plan for f/u with Vit D lab - Starting Gabapentin  100 mg TID for nerve pain - F/u w/ neuro-spine surgery as schedule  Constipation,  unspecified constipation type - Starting MiraLax  17g daily - Starting Senokot daily   F/u in 2 months  Houston Samuels, DO PGY 1 Family medicine resident [facility]

## 2023-12-23 ENCOUNTER — Ambulatory Visit

## 2023-12-29 ENCOUNTER — Ambulatory Visit: Attending: Physical Medicine and Rehabilitation

## 2023-12-29 DIAGNOSIS — M542 Cervicalgia: Secondary | ICD-10-CM | POA: Insufficient documentation

## 2023-12-29 DIAGNOSIS — M6281 Muscle weakness (generalized): Secondary | ICD-10-CM | POA: Diagnosis not present

## 2023-12-29 DIAGNOSIS — M5416 Radiculopathy, lumbar region: Secondary | ICD-10-CM | POA: Diagnosis not present

## 2023-12-29 DIAGNOSIS — M25512 Pain in left shoulder: Secondary | ICD-10-CM | POA: Diagnosis not present

## 2023-12-29 NOTE — Therapy (Signed)
 OUTPATIENT PHYSICAL THERAPY TREATMENT NOTE   Patient Name: Eric Hooper MRN: [id number] DOB:January 31, 1976, 48 y.o., male Today's Date: 12/29/2023  END OF SESSION:  PT End of Session - 12/29/23 1125     Visit Number 2    Number of Visits 12    Date for PT Re-Evaluation 02/02/24    Authorization Type [facility] [facility]    Authorization Time Period auth after 27 visits per appt notes    Authorization - Visit Number 10    Authorization - Number of Visits 27    PT Start Time 1130    PT Stop Time 1208    PT Time Calculation (min) 38 min    Activity Tolerance Patient limited by pain    Behavior During Therapy [facility] for tasks assessed/performed          Past Medical History:  Diagnosis Date   At risk for dysfunction of heart 07/11/2018   Chest wall pain 08/14/2018   Controlled diabetes mellitus type 2 with complications (HCC) 02/25/2018   Diabetes mellitus (HCC) 02/01/2017   Diabetes mellitus without complication (HCC)    non-insulin  dependent   Diverticulosis 02/01/2017   on CT scan abd/pelvis   Eczema    GERD (gastroesophageal reflux disease)    Hypertension    Kidney stones 02/03/2017   Leg swelling 05/12/2017   Macrocytic anemia 02/01/2017   Microscopic hematuria 02/01/2017   Mixed hyperlipidemia 02/28/2018   Phlebitis 05/12/2017   Recurrent upper respiratory infection (URI)    Renal insufficiency 02/03/2017   Smoker 04/13/2017   Past Surgical History:  Procedure Laterality Date   COLONOSCOPY     ELBOW SURGERY Right    ESOPHAGEAL MANOMETRY N/A 09/22/2023   Procedure: MANOMETRY, ESOPHAGUS;  Surgeon: Legrand Victory LITTIE DOUGLAS, MD;  Location: [facility];  Service: Gastroenterology;  Laterality: N/A;   IR FLUORO GUIDE CV LINE RIGHT  02/02/2017   IR FLUORO GUIDE CV LINE RIGHT  11/23/2021   IR FLUORO GUIDE CV LINE RIGHT  11/28/2021   IR REMOVAL TUN CV CATH W/O FL  02/26/2022   IR US  GUIDE VASC ACCESS RIGHT  02/02/2017   IR US  GUIDE VASC ACCESS RIGHT  11/23/2021   PH  IMPEDANCE STUDY N/A 09/22/2023   Procedure: IMPEDANCE PH STUDY, ESOPHAGUS;  Surgeon: Legrand Victory LITTIE DOUGLAS, MD;  Location: [facility];  Service: Gastroenterology;  Laterality: N/A;   UPPER GASTROINTESTINAL ENDOSCOPY     Patient Active Problem List   Diagnosis Date Noted   Heartburn 12/16/2023   Atypical chest pain 12/16/2023   Foot lesion 09/03/2023   Headache 07/23/2022   Cervical disc disorder with radiculopathy of cervical region 06/20/2020   Seasonal allergic rhinitis 03/05/2020   Chronic left shoulder pain 11/27/2019   GERD (gastroesophageal reflux disease) 08/10/2019   Hypertension associated with diabetes (HCC) 08/10/2019   Insomnia 06/09/2019   PICC (peripherally inserted central catheter) in place 09/05/2018   Mixed diabetic hyperlipidemia associated with type 2 diabetes mellitus (HCC) 02/28/2018   Type 2 diabetes mellitus without complications (HCC) 02/25/2018   Tobacco use disorder 04/13/2017   T.T.P. syndrome (HCC) 02/06/2017   Thrombocytopenia (HCC) 02/01/2017    PCP: Toma Matas, MD  REFERRING PROVIDER: Romelle Booty, MD; attestation by Donah Laymon PARAS, MD   REFERRING DIAG:  M54.50 (ICD-10-CM) - Low back pain without sciatica, unspecified back pain laterality, unspecified chronicity    Rationale for Evaluation and Treatment: Rehabilitation  THERAPY DIAG:  Radiculopathy, lumbar region  Muscle weakness (generalized)  ONSET DATE: 11/05/2023 date of referral  SUBJECTIVE:                                                                                                                                                                                           SUBJECTIVE STATEMENT: Patient reports he did a lot of walking over the holiday weekend and is having some soreness from this, but overall is feeling better than he did last week. He states that he will be receiving some shots in his lower back on Thursday for his pain.   EVAL: Patient reports to PT  with low back pain and R hip pain that has been present for a few months. It has significantly worsening over the past week. He did fall yesterday when standing up from the bed, and his leg gave out.   PERTINENT HISTORY:  Relevant PMHx includes DM type 2, Diverticulosis (2018), HTN, Renal insufficiency, Chronic L shoulder and cervical pain, TTP Syndrome, Thrombocytopenia   PAIN:  Are you having pain?  Yes: NPRS scale: 9/10 current Pain location: L hip  Pain description: nerve pain, pulling  Aggravating factors: sitting Relieving factors: walking   PRECAUTIONS: None  RED FLAGS: None   WEIGHT BEARING RESTRICTIONS: No  FALLS:  Has patient fallen in last 6 months? Yes; yesterday, my leg gave out on me when I was standing up from the bed in the morning.     LIVING ENVIRONMENT: 1 story home, 3 STE BIL rail Lives w/ fiancee, housework split   OCCUPATION: not working since TTP per pt report. Used to work for a company doing heavy lifting, Warden/ranger   PLOF: Independent   PATIENT GOALS: less pain, go back to work if possible    NEXT MD VISIT: after PT per pt report    OBJECTIVE:  Note: Objective measures were completed at Evaluation unless otherwise noted.   PATIENT SURVEYS:  Modified Oswestry:  MODIFIED OSWESTRY DISABILITY SCALE  Date: 12/22/2023 Score  Pain intensity 1 = The pain is bad, but I can manage without having to take (1) I can stand as long as I want but, it increases my pain. pain medication.  2. Personal care (washing, dressing, etc.) 1 =  I can take care of myself normally, but it increases my pain.  3. Lifting 0 = I can lift heavy weights without increased pain.  4. Walking 0 = Pain does not prevent me from walking any distance  5. Sitting 4 =  Pain prevents me from sitting more than 10 minutes.  6. Standing 2 =  Pain prevents me from standing more than 1 hour  7. Sleeping 2 =  Even when I  take pain medication, I sleep less than 6 hours  8. Social Life  1 =  My social life is normal, but it increases my level of pain.  9. Traveling 1 =  I can travel anywhere, but it increases my pain.  10. Employment/ Homemaking 1 = My normal homemaking/job activities increase my pain, but I can still perform all that is required of me  Total 13/50   Interpretation of scores: Score Category Description  0-20% Minimal Disability The patient can cope with most living activities. Usually no treatment is indicated apart from advice on lifting, sitting and exercise  21-40% Moderate Disability The patient experiences more pain and difficulty with sitting, lifting and standing. Travel and social life are more difficult and they may be disabled from work. Personal care, sexual activity and sleeping are not grossly affected, and the patient can usually be managed by conservative means  41-60% Severe Disability Pain remains the main problem in this group, but activities of daily living are affected. These patients require a detailed investigation  61-80% Crippled Back pain impinges on all aspects of the patient's life. Positive intervention is required  81-100% Bed-bound  These patients are either bed-bound or exaggerating their symptoms  Bluford FORBES Zoe DELENA Karon DELENA, et al. Surgery versus conservative management of stable thoracolumbar fracture: the PRESTO feasibility RCT. [place]): [employer]; 2021 Nov. [facility] Technology Assessment, No. 25.62.) Appendix 3, Oswestry Disability Index category descriptors. Available from: FindJewelers.cz  Minimally Clinically Important Difference (MCID) = 12.8%  COGNITION: Overall cognitive status: Within functional limits for tasks assessed     MUSCLE LENGTH: Hamstrings: Right 75 deg; Left 30 deg  LUMBAR ROM: deferred d/t high irritability at time of evaluation   AROM eval  Flexion   Extension   Right lateral flexion   Left lateral flexion   Right rotation   Left rotation     (Blank rows = not tested)  LOWER EXTREMITY ROM:     Active  Right eval Left eval  Hip flexion    Hip extension    Hip abduction    Hip adduction    Hip internal rotation    Hip external rotation    Knee flexion    Knee extension    Ankle dorsiflexion    Ankle plantarflexion    Ankle inversion    Ankle eversion     (Blank rows = not tested)  LOWER EXTREMITY MMT:    MMT Right eval Left eval  Hip flexion 4 4  Hip extension    Hip abduction 4+ 4-  Hip adduction    Hip internal rotation    Hip external rotation    Knee flexion    Knee extension 4+ 4+  Ankle dorsiflexion    Ankle plantarflexion    Ankle inversion    Ankle eversion     (Blank rows = not tested)  LUMBAR SPECIAL TESTS:  Straight leg raise test: Positive and Slump test: Positive  TREATMENT DATE:  [facility] Adult PT Treatment:                                                DATE: 12/29/23 Therapeutic Exercise: Nustep level 3 x 8 mins Seated hamstring stretch 2x30 BIL  Neuromuscular re-ed: Prone on elbows x3' Prone press ups 2x10 (small range, more painful for Lt shoulder) Supine PPT 3  hold x10 Supine bridges 2x10 Supine LTR x10 BIL Sidelying open books x10 BIL Seated pball roll outs fwd/ x2 (painful)   [facility] Adult PT Treatment:                                                DATE: 12/22/2023   Initial evaluation: see patient education and home exercise program as noted below                                                                                                                                   PATIENT EDUCATION:  Education details: reviewed initial home exercise program; discussion of POC, prognosis and goals for skilled PT   Person educated: Patient Education method: Explanation Education comprehension: verbalized understanding, returned demonstration, and needs further education  HOME EXERCISE PROGRAM: Access Code: [facility] URL: [email or web address] Date:  12/22/2023 Prepared by: Marko Molt  Exercises - Lying Prone  - 2 x daily - 7 x weekly - 2 sets - 10 reps - 3 sec hold - Static Prone on Elbows  - 2 x daily - 7 x weekly - 4 sets - 10 sec hold - Seated Thoracic Lumbar Extension with Pectoralis Stretch  - 2 x daily - 7 x weekly - 2 sets - 10 reps - 3 sec hold - Standing Lumbar Extension with Counter  - 2 x daily - 7 x weekly - 2 sets - 5 reps - 3 sec hold  ASSESSMENT:  CLINICAL IMPRESSION: Patient presents to PT reporting continued pain in his lower back especially, but that he overall feels better than he did last week. Session today focused on gentle strengthening and lumbar mobility. He is somewhat limited by pain during session, needing some rest breaks and unable to perform some exercises due to pain. Patient continues to benefit from skilled PT services and should be progressed as able to improve functional independence.   EVAL: Jakori is a 48 y.o. male who was seen today for physical therapy evaluation and treatment for persistent low back pain with radicular symptoms. He is demonstrating high symptom irritability with prolonged sitting, decreased lower extremity MMT scores, improved severity of symptoms/centralization of symptoms with thoracolumbar extension exercises. He requires skilled PT services at this time to address relevant deficits and improve overall function.     OBJECTIVE IMPAIRMENTS: decreased activity tolerance, decreased ROM, decreased strength, improper body mechanics, and pain.   ACTIVITY LIMITATIONS: carrying, lifting, bending, sitting, and squatting  PARTICIPATION LIMITATIONS: meal prep, cleaning, driving, shopping, community activity, and occupation  PERSONAL FACTORS: Past/current experiences, Profession, Time since onset of injury/illness/exacerbation, and 3+ comorbidities: Relevant PMHx includes DM type 2, Diverticulosis (2018), HTN, Renal insufficiency, Chronic L shoulder and cervical pain, TTP Syndrome,  Thrombocytopenia  are also affecting patient's functional outcome.   REHAB POTENTIAL: Fair  CLINICAL DECISION MAKING: Evolving/moderate complexity  EVALUATION COMPLEXITY: Moderate   GOALS: Goals reviewed with patient? YES  SHORT TERM GOALS: Target date: 01/12/2024    Patient will be independent with initial home program at least 3 days/week.  Baseline: provided at eval Goal Status: INITIAL    2.  Patient will demonstrate ability to tolerate at least 50% lumbar AROM in all directions Baseline: Unable to tolerate due to severity of symptoms and high irritability with LS motion Goal status: INITIAL   LONG TERM GOALS: Target date: 02/02/2024    Patient will report improved overall functional ability with ODI score of 5/50 or less.  Baseline: 13/50 Goal Status: INITIAL   2.  Patient will demonstrate improved LE MMT to at least 4+/5 BIL.  Baseline: see MMT chart above  Goal status: INITIAL  3.  Patient will demonstrate ability to tolerate at least 50% lumbar AROM in all directions Baseline: Unable to tolerate due to severity of symptoms and high irritability with LS motion Goal status: INITIAL  4.  Patient will demonstrate ability to perform floor to waist lifting of at least 25# using appropriate body mechanics and with no more than minimal pain in order to safely perform normal daily/occupational tasks.   Baseline: unable to tolerate   Goal Status: INITIAL   5. Patient will report ability to tolerate sitting >30 minutes without exacerbation of LQ pain.  Baseline: Unable to tolerate; patient reports severe pain immediately with seated positions Goal Status: INITIAL    PLAN:  PT FREQUENCY: 1-2x/week  PT DURATION: 6 weeks  PLANNED INTERVENTIONS: 97164- PT Re-evaluation, 97750- Physical Performance Testing, 97110-Therapeutic exercises, 97530- Therapeutic activity, [id number]- Neuromuscular re-education, 97535- Self Care, 02859- Manual therapy, [id number]- Aquatic Therapy,  H9716- Electrical stimulation (unattended), 20560 (1-2 muscles), 20561 (3+ muscles)- Dry Needling, Patient/Family education, Taping, Spinal mobilization, Cryotherapy, and Moist heat.  PLAN FOR NEXT SESSION: continue with LS extension focus for centralization of symptoms; trial Nustep for low-impact aerobic activity to elicit pain modulation effects; consider thermal modalities and manual therapy (incl grade 1/2 Mob) as indicated; otherwise progress towards established goals as tolerated    Corean Pouch PTA  12/29/2023 12:09 PM

## 2023-12-31 ENCOUNTER — Ambulatory Visit: Payer: Self-pay | Admitting: Allergy & Immunology

## 2023-12-31 DIAGNOSIS — J302 Other seasonal allergic rhinitis: Secondary | ICD-10-CM

## 2023-12-31 DIAGNOSIS — J3489 Other specified disorders of nose and nasal sinuses: Secondary | ICD-10-CM

## 2023-12-31 MED ORDER — AMOXICILLIN-POT CLAVULANATE 875-125 MG PO TABS: 1.0000 | ORAL_TABLET | Freq: Two times a day (BID) | ORAL | 0 refills | Status: AC

## 2024-01-02 ENCOUNTER — Other Ambulatory Visit: Payer: Self-pay | Admitting: Oncology

## 2024-01-03 ENCOUNTER — Telehealth: Payer: Self-pay

## 2024-01-03 ENCOUNTER — Encounter: Payer: Self-pay | Admitting: Family Medicine

## 2024-01-03 ENCOUNTER — Ambulatory Visit: Admitting: Family Medicine

## 2024-01-03 VITALS — BP 130/84 | HR 86 | Ht 70.0 in | Wt 179.5 lb

## 2024-01-03 DIAGNOSIS — T50Z95A Adverse effect of other vaccines and biological substances, initial encounter: Secondary | ICD-10-CM | POA: Diagnosis not present

## 2024-01-03 DIAGNOSIS — R509 Fever, unspecified: Secondary | ICD-10-CM

## 2024-01-03 DIAGNOSIS — M7989 Other specified soft tissue disorders: Secondary | ICD-10-CM | POA: Diagnosis not present

## 2024-01-03 NOTE — Patient Instructions (Signed)
 Fever, Adult     A fever is a high body temperature that is 100.53F (38C) or higher. Brief mild or moderate fevers often have no lasting effects. They often do not need treatment. Moderate or high fevers can feel uncomfortable. Sometimes, they can be a sign of a serious problem. A fever that keeps coming back or that lasts a long time may cause you to lose water in your body (get dehydrated). You can use a thermometer to check for a fever. Temperature can change with: Age. Time of day. Where the temperature is taken, such as: In the mouth. In the opening of the butt (anus). In the ear. Under the arm. On the forehead. Follow these instructions at home: Medicines Take over-the-counter and prescription medicines only as told by your doctor. Follow instructions on how much medicine to take and how often. If you were prescribed antibiotics, take them as told by your doctor. Do not stop taking them even if you start to feel better. General instructions Watch for any changes in your symptoms. Tell your doctor about them. Rest as needed. Drink enough fluid to keep your pee (urine) pale yellow. Bathe or sponge bathe with room-temperature water as needed. This helps to lower your body temperature. Do not use cold water. Do not use too many blankets or wear clothes that are too heavy. You should stay home from work and public places for at least 24 hours after your fever is gone. Your fever should be gone without the use of medicines. Contact a doctor if: You vomit or have watery poop (diarrhea) that does not get better. You cannot eat or drink without vomiting. It hurts when you pee. Your symptoms do not get better with treatment or you have new symptoms. You have a rash on your skin. You have signs of not having enough water in your body, such as: Dark pee, very little pee, or no pee. Cracked lips or dry mouth. Sunken eyes. Sleepiness. Weakness. Get help right away if: You are short of  breath or have trouble breathing. You are dizzy or pass out (faint). You feel mixed up or confused. You have very bad pain in your belly (abdomen). These symptoms may be an emergency. Get help right away. Call 911. Do not wait to see if the symptoms will go away. Do not drive yourself to the hospital. This information is not intended to replace advice given to you by your health care provider. Make sure you discuss any questions you have with your health care provider. Document Revised: 01/13/2022 Document Reviewed: 01/13/2022 Elsevier Patient Education  2024 [employer].

## 2024-01-03 NOTE — Progress Notes (Signed)
    SUBJECTIVE:   CHIEF COMPLAINT / HPI:   Discussed the use of AI scribe software for clinical note transcription with the patient, who gave verbal consent to proceed.  History of Present Illness   Eric Hooper is a 48 year old male who presents with complications following vaccination.  He received three vaccinations (shingles, flu, and pneumonia) in his left arm on Friday. That night, he began experiencing severe sweating, fever, and pain. His arm is described as 'so hot,' with pain rated at 10 out of 10. He also experiences muscle and joint pain throughout his body, rated at 8 out of 10, and cramping.  He has been experiencing night sweats, waking up with his T-shirt, bed, and pillowcase soaking wet. He reports a fever of 104F last night. He has not been feeling well since receiving the vaccinations. He has had multiple bouts of fever. Takes Tylenol  as needed.  He is currently taking amlodipine  10 mg daily, Augmentin  for sinus disease, baby aspirin , Lipitor 40 mg daily, Jardiance , Inspra  25 mg daily, and losartan  100 mg. He took Tylenol  for pain, but it did not help. He is allergic to ibuprofen due to TTP.  He does not check his blood pressure at home as the machines give high readings, but he checks it at the doctor's office. He notes that his blood pressure tends to rise when he is in pain. No viral infections at home.       PERTINENT  PMH / PSH: PMHx reviewed  OBJECTIVE:   BP 130/84   Pulse 86   Ht 5' 10 (1.778 m)   Wt 179 lb 8 oz (81.4 kg)   SpO2 97%   BMI 25.76 kg/m  VITALS: BP- 130/84 Physical Exam   Gen: No distress Heart: S1 S2 normal, no murmurs. RRR Lungs: CTA B/L EXTREMITIES: Left shoulder and arm mildly swollen compared to right with mild tenderness. Unable to tell if erythematous due to his tattoo.     ASSESSMENT/PLAN:   Assessment & Plan   Assessment and Plan    Adverse reaction to vaccination with left arm pain, swelling, and fever Acute reaction  post-vaccination with severe pain, swelling, fever, and systemic symptoms. Differential includes infection (viral/bacterial) versus vaccine side effects. He is already on Augmentin  for a non-related issue. Current Augmentin  therapy may cover the infection. - Advised monitoring for improvement. However, offered lab due to his worry about systemic infection - Order blood tests to rule out infection. - Apply warm compresses to the left elbow. - Administer Tylenol  as needed for fever and pain. - Consider stronger pain medication if symptoms persist. - DVT considered, but less likely given this started related to vaccination and is associated with fever - Schedule follow-up if symptoms do not improve.   Hypertension Blood pressure is slightly elevated, likely secondary to pain from an adverse vaccine reaction.   History of thrombotic thrombocytopenic purpura (TTP) Known ibuprofen allergy . Avoid NSAIDs due to TTP risk. - Review medical records before prescribing any new medications to avoid TTP interactions.   NB: Was sent to [facility] for blood work. I gave him a follow-up call, and he said he got the lab done with no difficulty. Continue Tylenol  as needed for pain ED precautions discussed He agreed with the plan         Otto Fairly, MD [facility]

## 2024-01-03 NOTE — Telephone Encounter (Signed)
 Patient's fiance calls nurse line regarding concerns post shingles, pneumonia and flu vaccination.   Patient received these vaccinations on 12/31/23. She reports that patient received all thee vaccinations in left arm. This is verified via [facility]. Updated Epic record with this information.   She reports that he has been waking up sweaty with headache and body aches. Reports that arm is hot and swollen.   Patient has a history of TTP and they are concerned and would like for him to be evaluated today.   Scheduled same day appointment with Dr. Anders.   Chiquita JAYSON English, RN

## 2024-01-03 NOTE — Telephone Encounter (Signed)
 The patient called over the weekend with a question regarding Augmentin . He expressed concerns about an upcoming Rituxan  treatment and inquired about potential drug interactions between the medications. I consulted with the pharmacist, Kolleen, and confirmed that there are no known interactions between these drugs.   Additionally, the patient discussed of receiving the shingles, pneumonia, and influenza vaccinations simultaneously. Currently, he is experiencing a fever and arm swelling, which had been addressed by his healthcare provider.

## 2024-01-04 ENCOUNTER — Ambulatory Visit: Payer: Self-pay | Admitting: Family Medicine

## 2024-01-04 LAB — CBC
Hematocrit: 52 % — ABNORMAL HIGH (ref 37.5–51.0)
Hemoglobin: 17.1 g/dL (ref 13.0–17.7)
MCH: 31.4 pg (ref 26.6–33.0)
MCHC: 32.9 g/dL (ref 31.5–35.7)
MCV: 96 fL (ref 79–97)
Platelets: 203 x10E3/uL (ref 150–450)
RBC: 5.44 x10E6/uL (ref 4.14–5.80)
RDW: 13.8 % (ref 11.6–15.4)
WBC: 10.2 x10E3/uL (ref 3.4–10.8)

## 2024-01-05 ENCOUNTER — Ambulatory Visit

## 2024-01-05 DIAGNOSIS — M5416 Radiculopathy, lumbar region: Secondary | ICD-10-CM

## 2024-01-05 DIAGNOSIS — M6281 Muscle weakness (generalized): Secondary | ICD-10-CM

## 2024-01-05 DIAGNOSIS — M25512 Pain in left shoulder: Secondary | ICD-10-CM | POA: Diagnosis not present

## 2024-01-05 DIAGNOSIS — M542 Cervicalgia: Secondary | ICD-10-CM | POA: Diagnosis not present

## 2024-01-05 NOTE — Therapy (Signed)
 OUTPATIENT PHYSICAL THERAPY TREATMENT NOTE   Patient Name: Eric Hooper MRN: [id number] DOB:January 24, 1976, 48 y.o., male Today's Date: 01/05/2024  END OF SESSION:  PT End of Session - 01/05/24 1503     Visit Number 3    Number of Visits 12    Date for PT Re-Evaluation 02/02/24    Authorization Type [facility]    Authorization Time Period auth after 27 visits per appt notes    Authorization - Visit Number 11    Authorization - Number of Visits 27    PT Start Time 1500    PT Stop Time 1525    PT Time Calculation (min) 25 min    Activity Tolerance Patient limited by pain    Behavior During Therapy [facility] for tasks assessed/performed           Past Medical History:  Diagnosis Date   At risk for dysfunction of heart 07/11/2018   Chest wall pain 08/14/2018   Controlled diabetes mellitus type 2 with complications (HCC) 02/25/2018   Diabetes mellitus (HCC) 02/01/2017   Diabetes mellitus without complication (HCC)    non-insulin  dependent   Diverticulosis 02/01/2017   on CT scan abd/pelvis   Eczema    GERD (gastroesophageal reflux disease)    Hypertension    Kidney stones 02/03/2017   Leg swelling 05/12/2017   Macrocytic anemia 02/01/2017   Microscopic hematuria 02/01/2017   Mixed hyperlipidemia 02/28/2018   Phlebitis 05/12/2017   Recurrent upper respiratory infection (URI)    Renal insufficiency 02/03/2017   Smoker 04/13/2017   Past Surgical History:  Procedure Laterality Date   COLONOSCOPY     ELBOW SURGERY Right    ESOPHAGEAL MANOMETRY N/A 09/22/2023   Procedure: MANOMETRY, ESOPHAGUS;  Surgeon: Legrand Victory LITTIE DOUGLAS, MD;  Location: [facility];  Service: Gastroenterology;  Laterality: N/A;   IR FLUORO GUIDE CV LINE RIGHT  02/02/2017   IR FLUORO GUIDE CV LINE RIGHT  11/23/2021   IR FLUORO GUIDE CV LINE RIGHT  11/28/2021   IR REMOVAL TUN CV CATH W/O FL  02/26/2022   IR US  GUIDE VASC ACCESS RIGHT  02/02/2017   IR US  GUIDE VASC ACCESS RIGHT  11/23/2021   PH  IMPEDANCE STUDY N/A 09/22/2023   Procedure: IMPEDANCE PH STUDY, ESOPHAGUS;  Surgeon: Legrand Victory LITTIE DOUGLAS, MD;  Location: [facility];  Service: Gastroenterology;  Laterality: N/A;   UPPER GASTROINTESTINAL ENDOSCOPY     Patient Active Problem List   Diagnosis Date Noted   Heartburn 12/16/2023   Atypical chest pain 12/16/2023   Foot lesion 09/03/2023   Headache 07/23/2022   Cervical disc disorder with radiculopathy of cervical region 06/20/2020   Seasonal allergic rhinitis 03/05/2020   Chronic left shoulder pain 11/27/2019   GERD (gastroesophageal reflux disease) 08/10/2019   Hypertension associated with diabetes (HCC) 08/10/2019   Insomnia 06/09/2019   PICC (peripherally inserted central catheter) in place 09/05/2018   Mixed diabetic hyperlipidemia associated with type 2 diabetes mellitus (HCC) 02/28/2018   Type 2 diabetes mellitus without complications (HCC) 02/25/2018   Tobacco use disorder 04/13/2017   T.T.P. syndrome (HCC) 02/06/2017   Thrombocytopenia (HCC) 02/01/2017    PCP: Toma Matas, MD  REFERRING PROVIDER: Romelle Booty, MD; attestation by Donah Laymon PARAS, MD   REFERRING DIAG:  M54.50 (ICD-10-CM) - Low back pain without sciatica, unspecified back pain laterality, unspecified chronicity    Rationale for Evaluation and Treatment: Rehabilitation  THERAPY DIAG:  Radiculopathy, lumbar region  Muscle weakness (generalized)  ONSET DATE: 11/05/2023 date of  referral   SUBJECTIVE:                                                                                                                                                                                           SUBJECTIVE STATEMENT: Patient has 6/10 pain today. Reports that his pain had been worse this week after getting 3 vaccines the same day. I've been feeling   EVAL: Patient reports to PT with low back pain and R hip pain that has been present for a few months. It has significantly worsening over the  past week. He did fall yesterday when standing up from the bed, and his leg gave out.   PERTINENT HISTORY:  Relevant PMHx includes DM type 2, Diverticulosis (2018), HTN, Renal insufficiency, Chronic L shoulder and cervical pain, TTP Syndrome, Thrombocytopenia   PAIN:  Are you having pain?  Yes: NPRS scale: 9/10 current Pain location: L hip  Pain description: nerve pain, pulling  Aggravating factors: sitting Relieving factors: walking   PRECAUTIONS: None  RED FLAGS: None   WEIGHT BEARING RESTRICTIONS: No  FALLS:  Has patient fallen in last 6 months? Yes; yesterday, my leg gave out on me when I was standing up from the bed in the morning.     LIVING ENVIRONMENT: 1 story home, 3 STE BIL rail Lives w/ fiancee, housework split   OCCUPATION: not working since TTP per pt report. Used to work for a company doing heavy lifting, Warden/ranger   PLOF: Independent   PATIENT GOALS: less pain, go back to work if possible    NEXT MD VISIT: after PT per pt report    OBJECTIVE:  Note: Objective measures were completed at Evaluation unless otherwise noted.   PATIENT SURVEYS:  Modified Oswestry:  MODIFIED OSWESTRY DISABILITY SCALE  Date: 12/22/2023 Score  Pain intensity 1 = The pain is bad, but I can manage without having to take (1) I can stand as long as I want but, it increases my pain. pain medication.  2. Personal care (washing, dressing, etc.) 1 =  I can take care of myself normally, but it increases my pain.  3. Lifting 0 = I can lift heavy weights without increased pain.  4. Walking 0 = Pain does not prevent me from walking any distance  5. Sitting 4 =  Pain prevents me from sitting more than 10 minutes.  6. Standing 2 =  Pain prevents me from standing more than 1 hour  7. Sleeping 2 =  Even when I take pain medication, I sleep less than 6 hours  8. Social Life 1 =  My social life is  normal, but it increases my level of pain.  9. Traveling 1 =  I can travel anywhere, but  it increases my pain.  10. Employment/ Homemaking 1 = My normal homemaking/job activities increase my pain, but I can still perform all that is required of me  Total 13/50   Interpretation of scores: Score Category Description  0-20% Minimal Disability The patient can cope with most living activities. Usually no treatment is indicated apart from advice on lifting, sitting and exercise  21-40% Moderate Disability The patient experiences more pain and difficulty with sitting, lifting and standing. Travel and social life are more difficult and they may be disabled from work. Personal care, sexual activity and sleeping are not grossly affected, and the patient can usually be managed by conservative means  41-60% Severe Disability Pain remains the main problem in this group, but activities of daily living are affected. These patients require a detailed investigation  61-80% Crippled Back pain impinges on all aspects of the patient's life. Positive intervention is required  81-100% Bed-bound  These patients are either bed-bound or exaggerating their symptoms  Bluford FORBES Zoe DELENA Karon DELENA, et al. Surgery versus conservative management of stable thoracolumbar fracture: the PRESTO feasibility RCT. [place] ([place]): [employer]; 2021 Nov. [facility] [employer] Assessment, No. 25.62.) Appendix 3, Oswestry Disability Index category descriptors. Available from: FindJewel[employer].cz  Minimally Clinically Important Difference (MCID) = 12.8%  COGNITION: Overall cognitive status: Within functional limits for tasks assessed     MUSCLE LENGTH: Hamstrings: Right 75 deg; Left 30 deg  LUMBAR ROM: deferred d/t high irritability at time of evaluation   AROM eval  Flexion   Extension   Right lateral flexion   Left lateral flexion   Right rotation   Left rotation    (Blank rows = not tested)  LOWER EXTREMITY ROM:     Active  Right eval Left eval  Hip flexion    Hip  extension    Hip abduction    Hip adduction    Hip internal rotation    Hip external rotation    Knee flexion    Knee extension    Ankle dorsiflexion    Ankle plantarflexion    Ankle inversion    Ankle eversion     (Blank rows = not tested)  LOWER EXTREMITY MMT:    MMT Right eval Left eval  Hip flexion 4 4  Hip extension    Hip abduction 4+ 4-  Hip adduction    Hip internal rotation    Hip external rotation    Knee flexion    Knee extension 4+ 4+  Ankle dorsiflexion    Ankle plantarflexion    Ankle inversion    Ankle eversion     (Blank rows = not tested)  LUMBAR SPECIAL TESTS:  Straight leg raise test: Positive and Slump test: Positive  TREATMENT DATE:   [facility] Adult PT Treatment:                                                DATE: 01/05/2024 Therapeutic Exercise: Nustep level 3 x 8 mins  Neuromuscular re-ed: Prone on elbows x3' Prone press ups 2x10 (small range, more painful for Lt shoulder)  Self-Care:  Patient education regarding plan for HEP modifications as he recovers from recent vaccines. Gradual return to previous levels of exercises.  [facility] Adult PT  Treatment:                                                DATE: 12/29/23 Therapeutic Exercise: Nustep level 3 x 8 mins Seated hamstring stretch 2x30 BIL   Neuromuscular re-ed: Prone on elbows x3' Prone press ups 2x10 (small range, more painful for Lt shoulder) Supine PPT 3 hold x10 Supine bridges 2x10 Supine LTR x10 BIL Sidelying open books x10 BIL Seated pball roll outs fwd/ x2 (painful)   [facility] Adult PT Treatment:                                                DATE: 12/22/2023   Initial evaluation: see patient education and home exercise program as noted below                                                                                                                                   PATIENT EDUCATION:  Education details: reviewed initial home exercise program; discussion of POC, prognosis  and goals for skilled PT   Person educated: Patient Education method: Explanation Education comprehension: verbalized understanding, returned demonstration, and needs further education  HOME EXERCISE PROGRAM: Access Code: [facility] URL: [email or web address] Date: 12/22/2023 Prepared by: Marko Molt  Exercises - Lying Prone  - 2 x daily - 7 x weekly - 2 sets - 10 reps - 3 sec hold - Static Prone on Elbows  - 2 x daily - 7 x weekly - 4 sets - 10 sec hold - Seated Thoracic Lumbar Extension with Pectoralis Stretch  - 2 x daily - 7 x weekly - 2 sets - 10 reps - 3 sec hold - Standing Lumbar Extension with Counter  - 2 x daily - 7 x weekly - 2 sets - 5 reps - 3 sec hold  ASSESSMENT:  CLINICAL IMPRESSION: 01/05/2024 Patient had abbreviated session d/t late arrival. Decreased exercise volume today d/t this and time spent with patient education. He was able to tolerate well. We will progress to previous volume and intensity of exercises as tolerated.    EVAL: Eric Hooper is a 48 y.o. male who was seen today for physical therapy evaluation and treatment for persistent low back pain with radicular symptoms. He is demonstrating high symptom irritability with prolonged sitting, decreased lower extremity MMT scores, improved severity of symptoms/centralization of symptoms with thoracolumbar extension exercises. He requires skilled PT services at this time to address relevant deficits and improve overall function.     OBJECTIVE IMPAIRMENTS: decreased activity tolerance, decreased ROM, decreased strength, improper body mechanics, and pain.   ACTIVITY LIMITATIONS: carrying, lifting, bending, sitting, and squatting  PARTICIPATION LIMITATIONS: meal prep, cleaning,  driving, shopping, community activity, and occupation  PERSONAL FACTORS: Past/current experiences, Profession, Time since onset of injury/illness/exacerbation, and 3+ comorbidities: Relevant PMHx includes DM type 2, Diverticulosis  (2018), HTN, Renal insufficiency, Chronic L shoulder and cervical pain, TTP Syndrome, Thrombocytopenia  are also affecting patient's functional outcome.   REHAB POTENTIAL: Fair    CLINICAL DECISION MAKING: Evolving/moderate complexity  EVALUATION COMPLEXITY: Moderate   GOALS: Goals reviewed with patient? YES  SHORT TERM GOALS: Target date: 01/12/2024    Patient will be independent with initial home program at least 3 days/week.  Baseline: provided at eval Goal Status: INITIAL    2.  Patient will demonstrate ability to tolerate at least 50% lumbar AROM in all directions Baseline: Unable to tolerate due to severity of symptoms and high irritability with LS motion Goal status: INITIAL   LONG TERM GOALS: Target date: 02/02/2024    Patient will report improved overall functional ability with ODI score of 5/50 or less.  Baseline: 13/50 Goal Status: INITIAL   2.  Patient will demonstrate improved LE MMT to at least 4+/5 BIL.  Baseline: see MMT chart above  Goal status: INITIAL  3.  Patient will demonstrate ability to tolerate at least 50% lumbar AROM in all directions Baseline: Unable to tolerate due to severity of symptoms and high irritability with LS motion Goal status: INITIAL  4.  Patient will demonstrate ability to perform floor to waist lifting of at least 25# using appropriate body mechanics and with no more than minimal pain in order to safely perform normal daily/occupational tasks.   Baseline: unable to tolerate   Goal Status: INITIAL   5. Patient will report ability to tolerate sitting >30 minutes without exacerbation of LQ pain.  Baseline: Unable to tolerate; patient reports severe pain immediately with seated positions Goal Status: INITIAL    PLAN:  PT FREQUENCY: 1-2x/week  PT DURATION: 6 weeks  PLANNED INTERVENTIONS: 97164- PT Re-evaluation, 97750- Physical Performance Testing, 97110-Therapeutic exercises, 97530- Therapeutic activity, [id number]-  Neuromuscular re-education, 97535- Self Care, [id number]- Manual therapy, [id number]- Aquatic Therapy, H9716- Electrical stimulation (unattended), 20560 (1-2 muscles), 20561 (3+ muscles)- Dry Needling, Patient/Family education, Taping, Spinal mobilization, Cryotherapy, and Moist heat.  PLAN FOR NEXT SESSION: continue with LS extension focus for centralization of symptoms; trial Nustep for low-impact aerobic activity to elicit pain modulation effects; consider thermal modalities and manual therapy (incl grade 1/2 Mob) as indicated; otherwise progress towards established goals as tolerated    Marko Molt, PT, DPT  01/05/2024 3:37 PM

## 2024-01-06 ENCOUNTER — Encounter (INDEPENDENT_AMBULATORY_CARE_PROVIDER_SITE_OTHER): Payer: Self-pay

## 2024-01-06 ENCOUNTER — Inpatient Hospital Stay

## 2024-01-06 ENCOUNTER — Inpatient Hospital Stay (HOSPITAL_BASED_OUTPATIENT_CLINIC_OR_DEPARTMENT_OTHER): Admitting: Oncology

## 2024-01-06 ENCOUNTER — Inpatient Hospital Stay: Attending: Oncology

## 2024-01-06 VITALS — BP 139/82 | HR 82 | Temp 98.1°F | Resp 18 | Ht 70.0 in | Wt 177.9 lb

## 2024-01-06 VITALS — BP 120/69 | HR 58 | Temp 98.3°F | Resp 18

## 2024-01-06 DIAGNOSIS — M3119 Other thrombotic microangiopathy: Secondary | ICD-10-CM | POA: Insufficient documentation

## 2024-01-06 DIAGNOSIS — Z5112 Encounter for antineoplastic immunotherapy: Secondary | ICD-10-CM | POA: Diagnosis present

## 2024-01-06 DIAGNOSIS — Z79899 Other long term (current) drug therapy: Secondary | ICD-10-CM | POA: Insufficient documentation

## 2024-01-06 LAB — CBC WITH DIFFERENTIAL (CANCER CENTER ONLY)
Abs Immature Granulocytes: 0.01 K/uL (ref 0.00–0.07)
Basophils Absolute: 0.1 K/uL (ref 0.0–0.1)
Basophils Relative: 1 %
Eosinophils Absolute: 0.3 K/uL (ref 0.0–0.5)
Eosinophils Relative: 5 %
HCT: 46 % (ref 39.0–52.0)
Hemoglobin: 16.2 g/dL (ref 13.0–17.0)
Immature Granulocytes: 0 %
Lymphocytes Relative: 39 %
Lymphs Abs: 2.4 K/uL (ref 0.7–4.0)
MCH: 31.2 pg (ref 26.0–34.0)
MCHC: 35.2 g/dL (ref 30.0–36.0)
MCV: 88.5 fL (ref 80.0–100.0)
Monocytes Absolute: 0.6 K/uL (ref 0.1–1.0)
Monocytes Relative: 9 %
Neutro Abs: 2.9 K/uL (ref 1.7–7.7)
Neutrophils Relative %: 46 %
Platelet Count: 202 K/uL (ref 150–400)
RBC: 5.2 MIL/uL (ref 4.22–5.81)
RDW: 13.1 % (ref 11.5–15.5)
WBC Count: 6.3 K/uL (ref 4.0–10.5)
nRBC: 0 % (ref 0.0–0.2)

## 2024-01-06 LAB — CMP (CANCER CENTER ONLY)
ALT: 54 U/L — ABNORMAL HIGH (ref 0–44)
AST: 28 U/L (ref 15–41)
Albumin: 4.3 g/dL (ref 3.5–5.0)
Alkaline Phosphatase: 138 U/L — ABNORMAL HIGH (ref 38–126)
Anion gap: 12 (ref 5–15)
BUN: 17 mg/dL (ref 6–20)
CO2: 23 mmol/L (ref 22–32)
Calcium: 10 mg/dL (ref 8.9–10.3)
Chloride: 106 mmol/L (ref 98–111)
Creatinine: 1.06 mg/dL (ref 0.61–1.24)
GFR, Estimated: >60 mL/min (ref >60)
Glucose, Bld: 99 mg/dL (ref 70–99)
Potassium: 3.7 mmol/L (ref 3.5–5.1)
Sodium: 141 mmol/L (ref 135–145)
Total Bilirubin: 1.3 mg/dL — ABNORMAL HIGH (ref 0.0–1.2)
Total Protein: 6.9 g/dL (ref 6.5–8.1)

## 2024-01-06 LAB — LACTATE DEHYDROGENASE: LDH: 169 U/L (ref 98–192)

## 2024-01-06 MED ORDER — SODIUM CHLORIDE 0.9 % IV SOLN
375.0000 mg/m2 | Freq: Once | INTRAVENOUS | Status: DC
  Filled 2024-01-06: qty 80

## 2024-01-06 MED ORDER — SODIUM CHLORIDE 0.9 % IV SOLN: INTRAVENOUS | Status: DC

## 2024-01-06 MED ORDER — SODIUM CHLORIDE 0.9 % IV SOLN
375.0000 mg/m2 | Freq: Once | INTRAVENOUS | Status: AC
  Administered 2024-01-06: 800 mg via INTRAVENOUS
  Filled 2024-01-06: qty 50

## 2024-01-06 MED ORDER — DIPHENHYDRAMINE HCL 25 MG PO CAPS
50.0000 mg | ORAL_CAPSULE | Freq: Once | ORAL | Status: AC
  Administered 2024-01-06: 50 mg via ORAL
  Filled 2024-01-06: qty 2

## 2024-01-06 MED ORDER — ACETAMINOPHEN 325 MG PO TABS
650.0000 mg | ORAL_TABLET | Freq: Once | ORAL | Status: AC
  Administered 2024-01-06: 650 mg via ORAL
  Filled 2024-01-06: qty 2

## 2024-01-06 NOTE — Patient Instructions (Signed)
 [facility] - A DEPT OF [facility]   Discharge Instructions: Thank you for choosing [facility] to provide your oncology and hematology care.   If you have a lab appointment with the Cancer Center, please go directly to the Cancer Center and check in at the registration area.   Wear comfortable clothing and clothing appropriate for easy access to any Portacath or PICC line.   We strive to give you quality time with your provider. You may need to reschedule your appointment if you arrive late (15 or more minutes).  Arriving late affects you and other patients whose appointments are after yours.  Also, if you miss three or more appointments without notifying the office, you may be dismissed from the clinic at the provider's discretion.      For prescription refill requests, have your pharmacy contact our office and allow 72 hours for refills to be completed.    Today you received the following chemotherapy and/or immunotherapy agents RUXIENCE        To help prevent nausea and vomiting after your treatment, we encourage you to take your nausea medication as directed.  BELOW ARE SYMPTOMS THAT SHOULD BE REPORTED IMMEDIATELY: *FEVER GREATER THAN 100.4 F (38 C) OR HIGHER *CHILLS OR SWEATING *NAUSEA AND VOMITING THAT IS NOT CONTROLLED WITH YOUR NAUSEA MEDICATION *UNUSUAL SHORTNESS OF BREATH *UNUSUAL BRUISING OR BLEEDING *URINARY PROBLEMS (pain or burning when urinating, or frequent urination) *BOWEL PROBLEMS (unusual diarrhea, constipation, pain near the anus) TENDERNESS IN MOUTH AND THROAT WITH OR WITHOUT PRESENCE OF ULCERS (sore throat, sores in mouth, or a toothache) UNUSUAL RASH, SWELLING OR PAIN  UNUSUAL VAGINAL DISCHARGE OR ITCHING   Items with * indicate a potential emergency and should be followed up as soon as possible or go to the Emergency Department if any problems should occur.  Please show the CHEMOTHERAPY ALERT CARD or  IMMUNOTHERAPY ALERT CARD at check-in to the Emergency Department and triage nurse.  Should you have questions after your visit or need to cancel or reschedule your appointment, please contact [facility] - A DEPT OF [facility]  Dept: [phone]  and follow the prompts.  Office hours are 8:00 a.m. to 4:30 p.m. Monday - Friday. Please note that voicemails left after 4:00 p.m. may not be returned until the following business day.  We are closed weekends and major holidays. You have access to a nurse at all times for urgent questions. Please call the main number to the clinic Dept: [phone] and follow the prompts.   For any non-urgent questions, you may also contact your provider using MyChart. We now offer e-Visits for anyone 91 and older to request care online for non-urgent symptoms. For details visit [email or web address].   Also download the MyChart app! Go to the app store, search MyChart, open the app, select [place], and log in with your MyChart username and password.  Rituximab  Injection What is this medication? RITUXIMAB  (ri TUX i mab) treats leukemia and lymphoma. It works by blocking a protein that causes cancer cells to grow and multiply. This helps to slow or stop the spread of cancer cells. It may also be used to treat autoimmune conditions, such as arthritis. It works by slowing down an overactive immune system. It is a monoclonal antibody. This medicine may be used for other purposes; ask your health care provider or pharmacist if you have questions. COMMON BRAND NAME(S): RIABNI , Rituxan , RUXIENCE , truxima  What should  I tell my care team before I take this medication? They need to know if you have any of these conditions: Chest pain Heart disease Immune system problems Infection, such as chickenpox, cold sores, hepatitis B, herpes Irregular heartbeat or rhythm Kidney disease Low blood counts, such as low white cells, platelets, red  cells Lung disease Recent or upcoming vaccine An unusual or allergic reaction to rituximab , other medications, foods, dyes, or preservatives Pregnant or trying to get pregnant Breast-feeding How should I use this medication? This medication is injected into a vein. It is given by a care team in a hospital or clinic setting. A special MedGuide will be given to you before each treatment. Be sure to read this information carefully each time. Talk to your care team about the use of this medication in children. While this medication may be prescribed for children as young as 6 months for selected conditions, precautions do apply. Overdosage: If you think you have taken too much of this medicine contact a poison control center or emergency room at once. NOTE: This medicine is only for you. Do not share this medicine with others. What if I miss a dose? Keep appointments for follow-up doses. It is important not to miss your dose. Call your care team if you are unable to keep an appointment. What may interact with this medication? Do not take this medication with any of the following: Live vaccines This medication may also interact with the following: Cisplatin This list may not describe all possible interactions. Give your health care provider a list of all the medicines, herbs, non-prescription drugs, or dietary supplements you use. Also tell them if you smoke, drink alcohol, or use illegal drugs. Some items may interact with your medicine. What should I watch for while using this medication? Your condition will be monitored carefully while you are receiving this medication. You may need blood work while taking this medication. This medication can cause serious infusion reactions. To reduce the risk your care team may give you other medications to take before receiving this one. Be sure to follow the directions from your care team. This medication may increase your risk of getting an infection. Call  your care team for advice if you get a fever, chills, sore throat, or other symptoms of a cold or flu. Do not treat yourself. Try to avoid being around people who are sick. Call your care team if you are around anyone with measles, chickenpox, or if you develop sores or blisters that do not heal properly. Avoid taking medications that contain aspirin , acetaminophen , ibuprofen, naproxen, or ketoprofen unless instructed by your care team. These medications may hide a fever. This medication may cause serious skin reactions. They can happen weeks to months after starting the medication. Contact your care team right away if you notice fevers or flu-like symptoms with a rash. The rash may be red or purple and then turn into blisters or peeling of the skin. You may also notice a red rash with swelling of the face, lips, or lymph nodes in your neck or under your arms. In some patients, this medication may cause a serious brain infection that may cause death. If you have any problems seeing, thinking, speaking, walking, or standing, tell your care team right away. If you cannot reach your care team, urgently seek another source of medical care. Talk to your care team if you may be pregnant. Serious birth defects can occur if you take this medication during pregnancy and for  12 months after the last dose. You will need a negative pregnancy test before starting this medication. Contraception is recommended while taking this medication and for 12 months after the last dose. Your care team can help you find the option that works for you. Do not breastfeed while taking this medication and for at least 6 months after the last dose. What side effects may I notice from receiving this medication? Side effects that you should report to your care team as soon as possible: Allergic reactions or angioedema--skin rash, itching or hives, swelling of the face, eyes, lips, tongue, arms, or legs, trouble swallowing or  breathing Bowel blockage--stomach cramping, unable to have a bowel movement or pass gas, loss of appetite, vomiting Dizziness, loss of balance or coordination, confusion or trouble speaking Heart attack--pain or tightness in the chest, shoulders, arms, or jaw, nausea, shortness of breath, cold or clammy skin, feeling faint or lightheaded Heart rhythm changes--fast or irregular heartbeat, dizziness, feeling faint or lightheaded, chest pain, trouble breathing Infection--fever, chills, cough, sore throat, wounds that don't heal, pain or trouble when passing urine, general feeling of discomfort or being unwell Infusion reactions--chest pain, shortness of breath or trouble breathing, feeling faint or lightheaded Kidney injury--decrease in the amount of urine, swelling of the ankles, hands, or feet Liver injury--right upper belly pain, loss of appetite, nausea, light-colored stool, dark yellow or brown urine, yellowing skin or eyes, unusual weakness or fatigue Redness, blistering, peeling, or loosening of the skin, including inside the mouth Stomach pain that is severe, does not go away, or gets worse Tumor lysis syndrome (TLS)--nausea, vomiting, diarrhea, decrease in the amount of urine, dark urine, unusual weakness or fatigue, confusion, muscle pain or cramps, fast or irregular heartbeat, joint pain Side effects that usually do not require medical attention (report to your care team if they continue or are bothersome): Headache Joint pain Nausea Runny or stuffy nose Unusual weakness or fatigue This list may not describe all possible side effects. Call your doctor for medical advice about side effects. You may report side effects to FDA at 1-800-FDA-1088. Where should I keep my medication? This medication is given in a hospital or clinic. It will not be stored at home. NOTE: This sheet is a summary. It may not cover all possible information. If you have questions about this medicine, talk to your  doctor, pharmacist, or health care provider.  2024 Elsevier/Gold Standard (2021-09-04 00:00:00)

## 2024-01-06 NOTE — Progress Notes (Signed)
 Patient seen by Dr. Arley Hof today  Vitals are within treatment parameters:Yes   Labs are within treatment parameters: Yes   Treatment plan has been signed: Yes   Per physician team, Patient is ready for treatment and there are NO modifications to the treatment plan.

## 2024-01-06 NOTE — Progress Notes (Signed)
 [facility] OFFICE PROGRESS NOTE   Diagnosis: TTP  INTERVAL HISTORY:   [name] returns as scheduled.  He completed another treatment with rituximab  10/14/2023.  He reports tolerating the rituximab  well.  No symptom of an allergic reaction.  No bleeding.  He had a fever last weekend after receiving a pneumonia, influenza, and zoster vaccines the same day.  He is concerned about weight loss.  He has altered his diet due to diabetes.  He has a good appetite.  Objective:  Vital signs in last 24 hours:  Blood pressure 139/82, pulse 82, temperature 98.1 F (36.7 C), temperature source Temporal, resp. rate 18, height 5' 10 (1.778 m), weight 177 lb 14.4 oz (80.7 kg), SpO2 100%.    HEENT: No thrush Lymphatics: No cervical, supraclavicular, axillary, or inguinal nodes Resp: Lungs clear bilaterally Cardio: Regular rate and rhythm with premature beats GI: No hepatosplenomegaly, nontender Vascular: No leg edema  Skin: Hyperpigmented lesions over the trunk appear improved  Lab Results:  Lab Results  Component Value Date   WBC 6.3 01/06/2024   HGB 16.2 01/06/2024   HCT 46.0 01/06/2024   MCV 88.5 01/06/2024   PLT 202 01/06/2024   NEUTROABS 2.9 01/06/2024    CMP  Lab Results  Component Value Date   NA 141 01/06/2024   K 3.7 01/06/2024   CL 106 01/06/2024   CO2 23 01/06/2024   GLUCOSE 99 01/06/2024   BUN 17 01/06/2024   CREATININE 1.06 01/06/2024   CALCIUM  10.0 01/06/2024   PROT 6.9 01/06/2024   ALBUMIN 4.3 01/06/2024   AST 28 01/06/2024   ALT 54 (H) 01/06/2024   ALKPHOS 138 (H) 01/06/2024   BILITOT 1.3 (H) 01/06/2024   GFRNONAA >60 01/06/2024   GFRAA 125 06/14/2020     Medications: I have reviewed the patient's current medications.   Assessment/Plan: TTP initially diagnosed in October 2018 with recurrence in April 2020 and July 2023. Initiation of daily plasma exchange and Solu-Medrol  02/02/2017 [name] Antibody- 72, activity less than 2% on  initial presentation; [name] from 08/15/2018 <2.0. Last plasma exchange 02/06/2017 Prednisone  60 mg daily beginning 02/08/2017 Prednisone  40 mg daily beginning 02/17/2017 Prednisone  30 mg daily beginning 03/09/2017 Prednisone  20 mg daily beginning 03/23/2017 Prednisone  10 mg daily beginning 04/01/2017 Prednisone  5 mg daily for 5 days then 5 mg every other day for 5 doses then stop beginning 05/11/2017 Recurrent TTP 08/14/2018 Plasma exchange/steroids started on 08/15/2018.  Plasma exchange discontinued after treatment on 08/19/2019, discharged on prednisone  60mg  daily Prednisone  taper to 40 mg daily 08/22/2018 Weekly rituximab  for 4 doses beginning 08/25/2018 Plasma exchange resumed 08/26/2018, 08/27/2018, 08/28/2018, 08/29/2018, 08/31/2018, 09/02/2018 Prednisone  taper to 30 mg daily 09/01/2018 Cycle 3 weekly Rituxan  09/08/2018 Prednisone  taper to 20 mg daily 09/08/2018 Cycle 4 weekly Rituxan  09/15/2018 Prednisone  taper to 10 mg daily 09/20/2018 Prednisone  taper to 5 mg daily 09/27/2018 Prednisone  discontinued 10/07/2018 Admission with relapse of TTP 10/23/2021 Daily Plasma exchange initiated 11/23/2021 prednisone  8/1 Prednisone  tapered to 40 mg daily beginning 12/04/2021 Weekly Rituxan  for 4 doses beginning 12/04/2021 Discontinue plasmapheresis after appointment 12/05/2021 Admitted to [facility] 12/11/2021-plasmapheresis reinitiated, Cablivi started 12/12/2021, prednisone  dose currently 80 mg daily Every other day plasma exchange beginning 12/28/2021-completed 01/09/2022 Second weekly Rituxan  12/31/2021 Prednisone  taper to 60 mg daily beginning 12/31/2021 Third weekly Rituxan  01/06/2022 Prednisone  taper to 40 mg daily beginning 01/07/2022 Fourth weekly rituximab  01/13/2022 Prednisone  taper to 30 mg daily 01/13/2022 Prednisone  taper to 20 mg daily 01/20/2022 Prednisone  taper to 15 mg daily 02/04/2022 Per patient  report 02/19/2022 he has continued prednisone  30 mg daily and did not make the above adjustments, he will decrease  prednisone  to 20 mg daily today Prednisone  taper to 10 mg daily 03/09/2022 Prednisone  taper to 5 mg daily 03/26/2022 Prednisone  taper to 5 mg every other day for 5 doses beginning 04/16/2022 ADAMTS 13 :14.2 on 04/02/2023, 7.2 on 04/08/2023 04/22/2023-rituximab  [name]: 54.6 on 05/17/2023 [name] 06/18/2023-8.9%, antibody-2 06/24/2023-rituximab  [name] 64 on 07/22/2023 07/22/2023-rituximab  08/19/2023-rituximab  08/19/2023: [name]-89.8% 09/16/2023-rituximab  09/16/2023 [name]-86.9% 10/14/2023-rituximab  01/06/2024-brentuximab 2. Xiphoid pain-etiology unclear 3. Hypertension 4. Diabetes 5. Alcohol/Tobacco use 6. History of renal insufficiency 7. Right IJ dialysis catheter placed 02/02/2017, removed New right IJ dialysis catheter 08/15/2018 Catheter removed 09/12/2018 New right IJ dialysis catheter placed 11/23/2021 Temporary hemodialysis catheter converted to tunneled hemodialysis catheter 11/28/2021     Disposition: Mr. Cauthorn is in clinical remission from TTP.  We will follow-up on the [name] level from today.  He will complete another treat with rituximab  today.  The plan is to continue rituximab  every 3 months.  He will continue follow-up with his primary provider for evaluation of weight loss.  The weight loss is most likely related to diabetes and dietary changes.  Arley Hof, MD  01/06/2024  9:25 AM

## 2024-01-07 ENCOUNTER — Telehealth: Payer: Self-pay | Admitting: Oncology

## 2024-01-07 ENCOUNTER — Encounter: Payer: Self-pay | Admitting: Oncology

## 2024-01-07 NOTE — Telephone Encounter (Signed)
 Patient has been scheduled for follow-up visit per 01/04/24 LOS.  Pt noted appt details on personal electronic device.

## 2024-01-08 ENCOUNTER — Ambulatory Visit

## 2024-01-08 DIAGNOSIS — M25512 Pain in left shoulder: Secondary | ICD-10-CM | POA: Diagnosis not present

## 2024-01-08 DIAGNOSIS — M5416 Radiculopathy, lumbar region: Secondary | ICD-10-CM

## 2024-01-08 DIAGNOSIS — M542 Cervicalgia: Secondary | ICD-10-CM | POA: Diagnosis not present

## 2024-01-08 DIAGNOSIS — M6281 Muscle weakness (generalized): Secondary | ICD-10-CM | POA: Diagnosis not present

## 2024-01-08 LAB — ADAMTS13 ACTIVITY: Adamts 13 Activity: >100 % (ref >66.8)

## 2024-01-08 LAB — ADAMTS13 ACTIVITY REFLEX

## 2024-01-08 NOTE — Therapy (Signed)
 OUTPATIENT PHYSICAL THERAPY TREATMENT NOTE   Patient Name: Eric Hooper MRN: [id number] DOB:[birth year], 48 y.o., male Today's Date: 01/08/2024  END OF SESSION:  PT End of Session - 01/08/24 1054     Visit Number 4    Number of Visits 12    Date for PT Re-Evaluation 02/02/24    Authorization Type [facility] [facility] [facility]    Authorization Time Period auth after 27 visits per appt notes    Authorization - Visit Number 12    Authorization - Number of Visits 27    PT Start Time 1050    PT Stop Time 1130    PT Time Calculation (min) 40 min    Activity Tolerance Patient limited by pain    Behavior During Therapy [facility] for tasks assessed/performed            Past Medical History:  Diagnosis Date   At risk for dysfunction of heart 07/11/2018   Chest wall pain 08/14/2018   Controlled diabetes mellitus type 2 with complications (HCC) 02/25/2018   Diabetes mellitus (HCC) 02/01/2017   Diabetes mellitus without complication (HCC)    non-insulin  dependent   Diverticulosis 02/01/2017   on CT scan abd/pelvis   Eczema    GERD (gastroesophageal reflux disease)    Hypertension    Kidney stones 02/03/2017   Leg swelling 05/12/2017   Macrocytic anemia 02/01/2017   Microscopic hematuria 02/01/2017   Mixed hyperlipidemia 02/28/2018   Phlebitis 05/12/2017   Recurrent upper respiratory infection (URI)    Renal insufficiency 02/03/2017   Smoker 04/13/2017   Past Surgical History:  Procedure Laterality Date   COLONOSCOPY     ELBOW SURGERY Right    ESOPHAGEAL MANOMETRY N/A 09/22/2023   Procedure: MANOMETRY, ESOPHAGUS;  Surgeon: Legrand Victory LITTIE DOUGLAS, MD;  Location: [facility];  Service: Gastroenterology;  Laterality: N/A;   IR FLUORO GUIDE CV LINE RIGHT  02/02/2017   IR FLUORO GUIDE CV LINE RIGHT  11/23/2021   IR FLUORO GUIDE CV LINE RIGHT  11/28/2021   IR REMOVAL TUN CV CATH W/O FL  02/26/2022   IR US  GUIDE VASC ACCESS RIGHT  02/02/2017   IR US  GUIDE VASC ACCESS RIGHT  11/23/2021   PH  IMPEDANCE STUDY N/A 09/22/2023   Procedure: IMPEDANCE PH STUDY, ESOPHAGUS;  Surgeon: Legrand Victory LITTIE DOUGLAS, MD;  Location: [facility];  Service: Gastroenterology;  Laterality: N/A;   UPPER GASTROINTESTINAL ENDOSCOPY     Patient Active Problem List   Diagnosis Date Noted   Heartburn 12/16/2023   Atypical chest pain 12/16/2023   Foot lesion 09/03/2023   Headache 07/23/2022   Cervical disc disorder with radiculopathy of cervical region 06/20/2020   Seasonal allergic rhinitis 03/05/2020   Chronic left shoulder pain 11/27/2019   GERD (gastroesophageal reflux disease) 08/10/2019   Hypertension associated with diabetes (HCC) 08/10/2019   Insomnia 06/09/2019   PICC (peripherally inserted central catheter) in place 09/05/2018   Mixed diabetic hyperlipidemia associated with type 2 diabetes mellitus (HCC) 02/28/2018   Type 2 diabetes mellitus without complications (HCC) 02/25/2018   Tobacco use disorder 04/13/2017   T.T.P. syndrome (HCC) 02/06/2017   Thrombocytopenia (HCC) 02/01/2017    PCP: Toma Matas, MD  REFERRING PROVIDER: Romelle Booty, MD; attestation by Donah Laymon PARAS, MD   REFERRING DIAG:  M54.50 (ICD-10-CM) - Low back pain without sciatica, unspecified back pain laterality, unspecified chronicity    Rationale for Evaluation and Treatment: Rehabilitation  THERAPY DIAG:  Radiculopathy, lumbar region  Muscle weakness (generalized)  ONSET DATE: 11/05/2023 date  of referral   SUBJECTIVE:                                                                                                                                                                                           SUBJECTIVE STATEMENT: Patient reports ongoing LBP. He continues to be compliant with HEP.   EVAL: Patient reports to PT with low back pain and R hip pain that has been present for a few months. It has significantly worsening over the past week. He did fall yesterday when standing up from the bed, and  his leg gave out.   PERTINENT HISTORY:  Relevant PMHx includes DM type 2, Diverticulosis (2018), HTN, Renal insufficiency, Chronic L shoulder and cervical pain, TTP Syndrome, Thrombocytopenia   PAIN:  Are you having pain?  Yes: NPRS scale: 9/10 current Pain location: L hip  Pain description: nerve pain, pulling  Aggravating factors: sitting Relieving factors: walking   PRECAUTIONS: None  RED FLAGS: None   WEIGHT BEARING RESTRICTIONS: No  FALLS:  Has patient fallen in last 6 months? Yes; yesterday, my leg gave out on me when I was standing up from the bed in the morning.     LIVING ENVIRONMENT: 1 story home, 3 STE BIL rail Lives w/ fiancee, housework split   OCCUPATION: not working since TTP per pt report. Used to work for a company doing heavy lifting, Warden/ranger   PLOF: Independent   PATIENT GOALS: less pain, go back to work if possible    NEXT MD VISIT: after PT per pt report    OBJECTIVE:  Note: Objective measures were completed at Evaluation unless otherwise noted.   PATIENT SURVEYS:  Modified Oswestry:  MODIFIED OSWESTRY DISABILITY SCALE  Date: 12/22/2023 Score  Pain intensity 1 = The pain is bad, but I can manage without having to take (1) I can stand as long as I want but, it increases my pain. pain medication.  2. Personal care (washing, dressing, etc.) 1 =  I can take care of myself normally, but it increases my pain.  3. Lifting 0 = I can lift heavy weights without increased pain.  4. Walking 0 = Pain does not prevent me from walking any distance  5. Sitting 4 =  Pain prevents me from sitting more than 10 minutes.  6. Standing 2 =  Pain prevents me from standing more than 1 hour  7. Sleeping 2 =  Even when I take pain medication, I sleep less than 6 hours  8. Social Life 1 =  My social life is normal, but it increases my level of pain.  9. Traveling 1 =  I can travel anywhere, but it increases my pain.  10. Employment/ Homemaking 1 = My normal  homemaking/job activities increase my pain, but I can still perform all that is required of me  Total 13/50   Interpretation of scores: Score Category Description  0-20% Minimal Disability The patient can cope with most living activities. Usually no treatment is indicated apart from advice on lifting, sitting and exercise  21-40% Moderate Disability The patient experiences more pain and difficulty with sitting, lifting and standing. Travel and social life are more difficult and they may be disabled from work. Personal care, sexual activity and sleeping are not grossly affected, and the patient can usually be managed by conservative means  41-60% Severe Disability Pain remains the main problem in this group, but activities of daily living are affected. These patients require a detailed investigation  61-80% Crippled Back pain impinges on all aspects of the patient's life. Positive intervention is required  81-100% Bed-bound  These patients are either bed-bound or exaggerating their symptoms  Bluford FORBES Zoe DELENA Karon DELENA, et al. Surgery versus conservative management of stable thoracolumbar fracture: the PRESTO feasibility RCT. [place] ([place]): [employer]; 2021 Nov. [facility] Technology Assessment, No. 25.62.) Appendix 3, Oswestry Disability Index category descriptors. Available from: FindJewel[employer].cz  Minimally Clinically Important Difference (MCID) = 12.8%  COGNITION: Overall cognitive status: Within functional limits for tasks assessed     MUSCLE LENGTH: Hamstrings: Right 75 deg; Left 30 deg  LUMBAR ROM: deferred d/t high irritability at time of evaluation   AROM eval  Flexion   Extension   Right lateral flexion   Left lateral flexion   Right rotation   Left rotation    (Blank rows = not tested)  LOWER EXTREMITY ROM:     Active  Right eval Left eval  Hip flexion    Hip extension    Hip abduction    Hip adduction    Hip internal  rotation    Hip external rotation    Knee flexion    Knee extension    Ankle dorsiflexion    Ankle plantarflexion    Ankle inversion    Ankle eversion     (Blank rows = not tested)  LOWER EXTREMITY MMT:    MMT Right eval Left eval  Hip flexion 4 4  Hip extension    Hip abduction 4+ 4-  Hip adduction    Hip internal rotation    Hip external rotation    Knee flexion    Knee extension 4+ 4+  Ankle dorsiflexion    Ankle plantarflexion    Ankle inversion    Ankle eversion     (Blank rows = not tested)  LUMBAR SPECIAL TESTS:  Straight leg raise test: Positive and Slump test: Positive  TREATMENT DATE:   [facility] Adult PT Treatment:                                                DATE: 01/08/2024  Therapeutic Exercise: Nustep level 3 x 8 mins  Neuromuscular re-ed: Supine bridges 2x10 Supine LTR x10 BIL Sidelying open books x10 BIL Seated pball roll outs fwd x2  Airex pull downs GTB 2 x10 Airex isometric shoulder extension GTB + march 2 x 30 sec    [facility] Adult PT Treatment:  DATE: 01/05/2024 Therapeutic Exercise: Nustep level 3 x 8 mins  Neuromuscular re-ed: Prone on elbows x3' Prone press ups 2x10 (small range, more painful for Lt shoulder)  Self-Care:  Patient education regarding plan for HEP modifications as he recovers from recent vaccines. Gradual return to previous levels of exercises.                                                                                                                                   PATIENT EDUCATION:  Education details: reviewed initial home exercise program; discussion of POC, prognosis and goals for skilled PT   Person educated: Patient Education method: Explanation Education comprehension: verbalized understanding, returned demonstration, and needs further education  HOME EXERCISE PROGRAM: Access Code: [facility] URL: [email or web address] Date:  12/22/2023 Prepared by: Marko Molt  Exercises - Lying Prone  - 2 x daily - 7 x weekly - 2 sets - 10 reps - 3 sec hold - Static Prone on Elbows  - 2 x daily - 7 x weekly - 4 sets - 10 sec hold - Seated Thoracic Lumbar Extension with Pectoralis Stretch  - 2 x daily - 7 x weekly - 2 sets - 10 reps - 3 sec hold - Standing Lumbar Extension with Counter  - 2 x daily - 7 x weekly - 2 sets - 5 reps - 3 sec hold  ASSESSMENT:  CLINICAL IMPRESSION: 01/08/2024 Toua had fair tolerance of today's treatment session, which increased volume of exercises, including dynamic core mm activation. Patient reports constant pain with todays exercises, that is neither improved or worsened by exercises. Plan is to spend additional time at next visit reviewing HEP, with more core stability activities. We will continue to progress per POC as tolerated, in order to reach established rehab goals.      EVAL: Lelynd is a 47 y.o. male who was seen today for physical therapy evaluation and treatment for persistent low back pain with radicular symptoms. He is demonstrating high symptom irritability with prolonged sitting, decreased lower extremity MMT scores, improved severity of symptoms/centralization of symptoms with thoracolumbar extension exercises. He requires skilled PT services at this time to address relevant deficits and improve overall function.     OBJECTIVE IMPAIRMENTS: decreased activity tolerance, decreased ROM, decreased strength, improper body mechanics, and pain.   ACTIVITY LIMITATIONS: carrying, lifting, bending, sitting, and squatting  PARTICIPATION LIMITATIONS: meal prep, cleaning, driving, shopping, community activity, and occupation  PERSONAL FACTORS: Past/current experiences, Profession, Time since onset of injury/illness/exacerbation, and 3+ comorbidities: Relevant PMHx includes DM type 2, Diverticulosis (2018), HTN, Renal insufficiency, Chronic L shoulder and cervical pain, TTP Syndrome,  Thrombocytopenia  are also affecting patient's functional outcome.   REHAB POTENTIAL: Fair    CLINICAL DECISION MAKING: Evolving/moderate complexity  EVALUATION COMPLEXITY: Moderate   GOALS: Goals reviewed with patient? YES  SHORT TERM GOALS: Target date: 01/12/2024    Patient will be independent with initial home program at least 3  days/week.  Baseline: provided at eval Goal Status: INITIAL    2.  Patient will demonstrate ability to tolerate at least 50% lumbar AROM in all directions Baseline: Unable to tolerate due to severity of symptoms and high irritability with LS motion Goal status: INITIAL   LONG TERM GOALS: Target date: 02/02/2024    Patient will report improved overall functional ability with ODI score of 5/50 or less.  Baseline: 13/50 Goal Status: INITIAL   2.  Patient will demonstrate improved LE MMT to at least 4+/5 BIL.  Baseline: see MMT chart above  Goal status: INITIAL  3.  Patient will demonstrate ability to tolerate at least 50% lumbar AROM in all directions Baseline: Unable to tolerate due to severity of symptoms and high irritability with LS motion Goal status: INITIAL  4.  Patient will demonstrate ability to perform floor to waist lifting of at least 25# using appropriate body mechanics and with no more than minimal pain in order to safely perform normal daily/occupational tasks.   Baseline: unable to tolerate   Goal Status: INITIAL   5. Patient will report ability to tolerate sitting >30 minutes without exacerbation of LQ pain.  Baseline: Unable to tolerate; patient reports severe pain immediately with seated positions Goal Status: INITIAL    PLAN:  PT FREQUENCY: 1-2x/week  PT DURATION: 6 weeks  PLANNED INTERVENTIONS: 97164- PT Re-evaluation, 97750- Physical Performance Testing, 97110-Therapeutic exercises, 97530- Therapeutic activity, [id number]- Neuromuscular re-education, 97535- Self Care, [id number]- Manual therapy, [id number]- Aquatic Therapy,  [id number]- Electrical stimulation (unattended), 20560 (1-2 muscles), 20561 (3+ muscles)- Dry Needling, Patient/Family education, Taping, Spinal mobilization, Cryotherapy, and Moist heat.  PLAN FOR NEXT SESSION: continue with LS extension focus for centralization of symptoms; trial Nustep for low-impact aerobic activity to elicit pain modulation effects; consider thermal modalities and manual therapy (incl grade 1/2 Mob) as indicated; otherwise progress towards established goals as tolerated    Marko Molt, PT, DPT  01/08/2024 6:39 PM

## 2024-01-09 LAB — CULTURE, BLOOD (SINGLE)

## 2024-01-10 ENCOUNTER — Ambulatory Visit: Payer: Self-pay | Admitting: Oncology

## 2024-01-10 NOTE — Telephone Encounter (Signed)
 Patient gave verbal understanding and had no further questions or concerns

## 2024-01-10 NOTE — Telephone Encounter (Signed)
-----   Message from Arley Hof sent at 01/10/2024  8:21 AM EDT ----- Please call patient, the ADAMTS activity is normal, the TTP is in remission, f/u as scheduled  ----- Message ----- From: Debby Olam POUR, NP Sent: 01/10/2024   8:11 AM EDT To: Arley KATHEE Hof, MD   ----- Message ----- From: Rebecka, [facility] [place] Sent: 01/06/2024   8:27 AM EDT To: Olam POUR Debby, NP

## 2024-01-12 ENCOUNTER — Ambulatory Visit

## 2024-01-12 DIAGNOSIS — M542 Cervicalgia: Secondary | ICD-10-CM | POA: Diagnosis not present

## 2024-01-12 DIAGNOSIS — M6281 Muscle weakness (generalized): Secondary | ICD-10-CM

## 2024-01-12 DIAGNOSIS — M25512 Pain in left shoulder: Secondary | ICD-10-CM | POA: Diagnosis not present

## 2024-01-12 DIAGNOSIS — M5416 Radiculopathy, lumbar region: Secondary | ICD-10-CM

## 2024-01-12 DIAGNOSIS — R09A2 Foreign body sensation, throat: Secondary | ICD-10-CM | POA: Diagnosis not present

## 2024-01-12 DIAGNOSIS — J3081 Allergic rhinitis due to animal (cat) (dog) hair and dander: Secondary | ICD-10-CM | POA: Diagnosis not present

## 2024-01-12 DIAGNOSIS — R448 Other symptoms and signs involving general sensations and perceptions: Secondary | ICD-10-CM | POA: Diagnosis not present

## 2024-01-12 DIAGNOSIS — J343 Hypertrophy of nasal turbinates: Secondary | ICD-10-CM | POA: Diagnosis not present

## 2024-01-12 NOTE — Therapy (Signed)
 OUTPATIENT PHYSICAL THERAPY TREATMENT NOTE   Patient Name: Eric Hooper MRN: [id number] DOB:1975/08/31, 48 y.o., male Today's Date: 01/12/2024  END OF SESSION:  PT End of Session - 01/12/24 1135     Visit Number 5    Number of Visits 12    Date for Recertification  02/02/24    Authorization Type [employer]    Authorization Time Period auth after 27 visits per appt notes    Authorization - Visit Number 13    Authorization - Number of Visits 27    PT Start Time 1135    PT Stop Time 1213    PT Time Calculation (min) 38 min    Behavior During Therapy [facility] for tasks assessed/performed             Past Medical History:  Diagnosis Date   At risk for dysfunction of heart 07/11/2018   Chest wall pain 08/14/2018   Controlled diabetes mellitus type 2 with complications (HCC) 02/25/2018   Diabetes mellitus (HCC) 02/01/2017   Diabetes mellitus without complication (HCC)    non-insulin  dependent   Diverticulosis 02/01/2017   on CT scan abd/pelvis   Eczema    GERD (gastroesophageal reflux disease)    Hypertension    Kidney stones 02/03/2017   Leg swelling 05/12/2017   Macrocytic anemia 02/01/2017   Microscopic hematuria 02/01/2017   Mixed hyperlipidemia 02/28/2018   Phlebitis 05/12/2017   Recurrent upper respiratory infection (URI)    Renal insufficiency 02/03/2017   Smoker 04/13/2017   Past Surgical History:  Procedure Laterality Date   COLONOSCOPY     ELBOW SURGERY Right    ESOPHAGEAL MANOMETRY N/A 09/22/2023   Procedure: MANOMETRY, ESOPHAGUS;  Surgeon: Legrand Victory LITTIE DOUGLAS, MD;  Location: [facility];  Service: Gastroenterology;  Laterality: N/A;   IR FLUORO GUIDE CV LINE RIGHT  02/02/2017   IR FLUORO GUIDE CV LINE RIGHT  11/23/2021   IR FLUORO GUIDE CV LINE RIGHT  11/28/2021   IR REMOVAL TUN CV CATH W/O FL  02/26/2022   IR US  GUIDE VASC ACCESS RIGHT  02/02/2017   IR US  GUIDE VASC ACCESS RIGHT  11/23/2021   PH IMPEDANCE STUDY N/A 09/22/2023   Procedure:  IMPEDANCE PH STUDY, ESOPHAGUS;  Surgeon: Legrand Victory LITTIE DOUGLAS, MD;  Location: [facility];  Service: Gastroenterology;  Laterality: N/A;   UPPER GASTROINTESTINAL ENDOSCOPY     Patient Active Problem List   Diagnosis Date Noted   Heartburn 12/16/2023   Atypical chest pain 12/16/2023   Foot lesion 09/03/2023   Headache 07/23/2022   Cervical disc disorder with radiculopathy of cervical region 06/20/2020   Seasonal allergic rhinitis 03/05/2020   Chronic left shoulder pain 11/27/2019   GERD (gastroesophageal reflux disease) 08/10/2019   Hypertension associated with diabetes (HCC) 08/10/2019   Insomnia 06/09/2019   PICC (peripherally inserted central catheter) in place 09/05/2018   Mixed diabetic hyperlipidemia associated with type 2 diabetes mellitus (HCC) 02/28/2018   Type 2 diabetes mellitus without complications (HCC) 02/25/2018   Tobacco use disorder 04/13/2017   T.T.P. syndrome (HCC) 02/06/2017   Thrombocytopenia (HCC) 02/01/2017    PCP: Toma Matas, MD  REFERRING PROVIDER: Romelle Booty, MD; attestation by Donah Laymon PARAS, MD   REFERRING DIAG:  M54.50 (ICD-10-CM) - Low back pain without sciatica, unspecified back pain laterality, unspecified chronicity    Rationale for Evaluation and Treatment: Rehabilitation  THERAPY DIAG:  Radiculopathy, lumbar region  Muscle weakness (generalized)  ONSET DATE: 11/05/2023 date of referral   SUBJECTIVE:  SUBJECTIVE STATEMENT: Patient reports that his LBP has improved since last visit. It's not aching as much  EVAL: Patient reports to PT with low back pain and R hip pain that has been present for a few months. It has significantly worsening over the past week. He did fall yesterday when standing up from the bed, and his leg gave out.    PERTINENT HISTORY:  Relevant PMHx includes DM type 2, Diverticulosis (2018), HTN, Renal insufficiency, Chronic L shoulder and cervical pain, TTP Syndrome, Thrombocytopenia   PAIN:  Are you having pain?  Yes: NPRS scale: 9/10 current Pain location: L hip  Pain description: nerve pain, pulling  Aggravating factors: sitting Relieving factors: walking   PRECAUTIONS: None  RED FLAGS: None   WEIGHT BEARING RESTRICTIONS: No  FALLS:  Has patient fallen in last 6 months? Yes; yesterday, my leg gave out on me when I was standing up from the bed in the morning.     LIVING ENVIRONMENT: 1 story home, 3 STE BIL rail Lives w/ fiancee, housework split   OCCUPATION: not working since TTP per pt report. Used to work for a company doing heavy lifting, Warden/ranger   PLOF: Independent   PATIENT GOALS: less pain, go back to work if possible    NEXT MD VISIT: after PT per pt report    OBJECTIVE:  Note: Objective measures were completed at Evaluation unless otherwise noted.   PATIENT SURVEYS:  Modified Oswestry:  MODIFIED OSWESTRY DISABILITY SCALE  Date: 12/22/2023 Score  Pain intensity 1 = The pain is bad, but I can manage without having to take (1) I can stand as long as I want but, it increases my pain. pain medication.  2. Personal care (washing, dressing, etc.) 1 =  I can take care of myself normally, but it increases my pain.  3. Lifting 0 = I can lift heavy weights without increased pain.  4. Walking 0 = Pain does not prevent me from walking any distance  5. Sitting 4 =  Pain prevents me from sitting more than 10 minutes.  6. Standing 2 =  Pain prevents me from standing more than 1 hour  7. Sleeping 2 =  Even when I take pain medication, I sleep less than 6 hours  8. Social Life 1 =  My social life is normal, but it increases my level of pain.  9. Traveling 1 =  I can travel anywhere, but it increases my pain.  10. Employment/ Homemaking 1 = My normal homemaking/job  activities increase my pain, but I can still perform all that is required of me  Total 13/50   Interpretation of scores: Score Category Description  0-20% Minimal Disability The patient can cope with most living activities. Usually no treatment is indicated apart from advice on lifting, sitting and exercise  21-40% Moderate Disability The patient experiences more pain and difficulty with sitting, lifting and standing. Travel and social life are more difficult and they may be disabled from work. Personal care, sexual activity and sleeping are not grossly affected, and the patient can usually be managed by conservative means  41-60% Severe Disability Pain remains the main problem in this group, but activities of daily living are affected. These patients require a detailed investigation  61-80% Crippled Back pain impinges on all aspects of the patient's life. Positive intervention is required  81-100% Bed-bound  These patients are either bed-bound or exaggerating their symptoms  Bluford BRAVO, Zoe DELENA Karon DELENA, et al. Surgery versus  conservative management of stable thoracolumbar fracture: the PRESTO feasibility RCT. [place] ([place]): [employer]; 2021 Nov. [facility] Technology Assessment, No. 25.62.) Appendix 3, Oswestry Disability Index category descriptors. Available from: Find[employer]el[employer]cz  Minimally Clinically Important Difference (MCID) = 12.8%  COGNITION: Overall cognitive status: Within functional limits for tasks assessed     MUSCLE LENGTH: Hamstrings: Right 75 deg; Left 30 deg  LUMBAR ROM: deferred d/t high irritability at time of evaluation   AROM eval  Flexion   Extension   Right lateral flexion   Left lateral flexion   Right rotation   Left rotation    (Blank rows = not tested)  LOWER EXTREMITY ROM:     Active  Right eval Left eval  Hip flexion    Hip extension    Hip abduction    Hip adduction    Hip internal rotation    Hip  external rotation    Knee flexion    Knee extension    Ankle dorsiflexion    Ankle plantarflexion    Ankle inversion    Ankle eversion     (Blank rows = not tested)  LOWER EXTREMITY MMT:    MMT Right eval Left eval  Hip flexion 4 4  Hip extension    Hip abduction 4+ 4-  Hip adduction    Hip internal rotation    Hip external rotation    Knee flexion    Knee extension 4+ 4+  Ankle dorsiflexion    Ankle plantarflexion    Ankle inversion    Ankle eversion     (Blank rows = not tested)  LUMBAR SPECIAL TESTS:  Straight leg raise test: Positive and Slump test: Positive  TREATMENT DATE:   [facility] Adult PT Treatment:                                                DATE: 01/12/24  Therapeutic Exercise: Nustep level 4 x 8 min at end of session, concurrent MHP to LBP   Neuromuscular re-ed: Supine bridges 2x10 Supine LTR x10 BIL Sidelying open books x10 BIL Prone on elbows x3' Prone press ups 3x5 (small range, more painful for Lt shoulder) Low plank x 20 sec  Seated pball roll outs fwd x10 Airex pull downs GTB 2 x10 Airex isometric shoulder extension GTB + march 2 x 30 sec                                                                                                                                      PATIENT EDUCATION:  Education details: reviewed initial home exercise program; discussion of POC, prognosis and goals for skilled PT   Person educated: Patient Education method: Explanation Education comprehension: verbalized understanding, returned demonstration, and needs further education  HOME EXERCISE PROGRAM: Access Code:  [facility] URL: [email or web address] Date: 12/22/2023 Prepared by: Marko Molt  Exercises - Lying Prone  - 2 x daily - 7 x weekly - 2 sets - 10 reps - 3 sec hold - Static Prone on Elbows  - 2 x daily - 7 x weekly - 4 sets - 10 sec hold - Seated Thoracic Lumbar Extension with Pectoralis Stretch  - 2 x daily - 7 x weekly - 2  sets - 10 reps - 3 sec hold - Standing Lumbar Extension with Counter  - 2 x daily - 7 x weekly - 2 sets - 5 reps - 3 sec hold  ASSESSMENT:  CLINICAL IMPRESSION: 01/12/2024 Patient had good tolerance of today's session. We were able to increase exercise volume today. He is however, limited by increased L shoulder pain today, especially with plank exercise. We will continue to progress per POC in order to address LBP and radicular symptoms.     EVAL: Kalep is a 48 y.o. male who was seen today for physical therapy evaluation and treatment for persistent low back pain with radicular symptoms. He is demonstrating high symptom irritability with prolonged sitting, decreased lower extremity MMT scores, improved severity of symptoms/centralization of symptoms with thoracolumbar extension exercises. He requires skilled PT services at this time to address relevant deficits and improve overall function.     OBJECTIVE IMPAIRMENTS: decreased activity tolerance, decreased ROM, decreased strength, improper body mechanics, and pain.   ACTIVITY LIMITATIONS: carrying, lifting, bending, sitting, and squatting  PARTICIPATION LIMITATIONS: meal prep, cleaning, driving, shopping, community activity, and occupation  PERSONAL FACTORS: Past/current experiences, Profession, Time since onset of injury/illness/exacerbation, and 3+ comorbidities: Relevant PMHx includes DM type 2, Diverticulosis (2018), HTN, Renal insufficiency, Chronic L shoulder and cervical pain, TTP Syndrome, Thrombocytopenia  are also affecting patient's functional outcome.   REHAB POTENTIAL: Fair    CLINICAL DECISION MAKING: Evolving/moderate complexity  EVALUATION COMPLEXITY: Moderate   GOALS: Goals reviewed with patient? YES  SHORT TERM GOALS: Target date: 01/12/2024    Patient will be independent with initial home program at least 3 days/week.  Baseline: provided at eval Goal Status: INITIAL    2.  Patient will demonstrate ability to  tolerate at least 50% lumbar AROM in all directions Baseline: Unable to tolerate due to severity of symptoms and high irritability with LS motion Goal status: INITIAL   LONG TERM GOALS: Target date: 02/02/2024    Patient will report improved overall functional ability with ODI score of 5/50 or less.  Baseline: 13/50 Goal Status: INITIAL   2.  Patient will demonstrate improved LE MMT to at least 4+/5 BIL.  Baseline: see MMT chart above  Goal status: INITIAL  3.  Patient will demonstrate ability to tolerate at least 50% lumbar AROM in all directions Baseline: Unable to tolerate due to severity of symptoms and high irritability with LS motion Goal status: INITIAL  4.  Patient will demonstrate ability to perform floor to waist lifting of at least 25# using appropriate body mechanics and with no more than minimal pain in order to safely perform normal daily/occupational tasks.   Baseline: unable to tolerate   Goal Status: INITIAL   5. Patient will report ability to tolerate sitting >30 minutes without exacerbation of LQ pain.  Baseline: Unable to tolerate; patient reports severe pain immediately with seated positions Goal Status: INITIAL    PLAN:  PT FREQUENCY: 1-2x/week  PT DURATION: 6 weeks  PLANNED INTERVENTIONS: 97164- PT Re-evaluation, 97750- Physical Performance Testing, 97110-Therapeutic exercises, 97530- Therapeutic activity, 97112-  Neuromuscular re-education, [id number]- Self Care, [id number]- Manual therapy, [id number]- Aquatic Therapy, [phone]- Electrical stimulation (unattended), [phone] (1-2 muscles), 20561 (3+ muscles)- Dry Needling, Patient/Family education, Taping, Spinal mobilization, Cryotherapy, and Moist heat.  PLAN FOR NEXT SESSION: continue with LS extension focus for centralization of symptoms; trial Nustep for low-impact aerobic activity to elicit pain modulation effects; consider thermal modalities and manual therapy (incl grade 1/2 Mob) as indicated; otherwise progress towards  established goals as tolerated    Marko Molt, PT, DPT  01/13/2024 10:30 AM

## 2024-01-14 ENCOUNTER — Ambulatory Visit

## 2024-01-14 DIAGNOSIS — M5416 Radiculopathy, lumbar region: Secondary | ICD-10-CM

## 2024-01-14 DIAGNOSIS — M542 Cervicalgia: Secondary | ICD-10-CM | POA: Diagnosis not present

## 2024-01-14 DIAGNOSIS — M6281 Muscle weakness (generalized): Secondary | ICD-10-CM | POA: Diagnosis not present

## 2024-01-14 DIAGNOSIS — M25512 Pain in left shoulder: Secondary | ICD-10-CM | POA: Diagnosis not present

## 2024-01-14 NOTE — Therapy (Signed)
 OUTPATIENT PHYSICAL THERAPY TREATMENT NOTE   Patient Name: Eric Hooper MRN: [id number] DOB:Aug 07, 1975, 48 y.o., male Today's Date: 01/14/2024  END OF SESSION:  PT End of Session - 01/14/24 0958     Visit Number 6    Number of Visits 12    Date for Recertification  02/02/24    Authorization Type [employer] [facility]    Authorization Time Period auth after 27 visits per appt notes    Authorization - Visit Number 14    Authorization - Number of Visits 27    PT Start Time 1000    PT Stop Time 1040    PT Time Calculation (min) 40 min    Activity Tolerance Patient limited by pain;Patient tolerated treatment well    Behavior During Therapy [facility] for tasks assessed/performed         Past Medical History:  Diagnosis Date   At risk for dysfunction of heart 07/11/2018   Chest wall pain 08/14/2018   Controlled diabetes mellitus type 2 with complications (HCC) 02/25/2018   Diabetes mellitus (HCC) 02/01/2017   Diabetes mellitus without complication (HCC)    non-insulin  dependent   Diverticulosis 02/01/2017   on CT scan abd/pelvis   Eczema    GERD (gastroesophageal reflux disease)    Hypertension    Kidney stones 02/03/2017   Leg swelling 05/12/2017   Macrocytic anemia 02/01/2017   Microscopic hematuria 02/01/2017   Mixed hyperlipidemia 02/28/2018   Phlebitis 05/12/2017   Recurrent upper respiratory infection (URI)    Renal insufficiency 02/03/2017   Smoker 04/13/2017   Past Surgical History:  Procedure Laterality Date   COLONOSCOPY     ELBOW SURGERY Right    ESOPHAGEAL MANOMETRY N/A 09/22/2023   Procedure: MANOMETRY, ESOPHAGUS;  Surgeon: Legrand Victory LITTIE DOUGLAS, MD;  Location: [facility];  Service: Gastroenterology;  Laterality: N/A;   IR FLUORO GUIDE CV LINE RIGHT  02/02/2017   IR FLUORO GUIDE CV LINE RIGHT  11/23/2021   IR FLUORO GUIDE CV LINE RIGHT  11/28/2021   IR REMOVAL TUN CV CATH W/O FL  02/26/2022   IR US  GUIDE VASC ACCESS RIGHT  02/02/2017   IR US  GUIDE VASC  ACCESS RIGHT  11/23/2021   PH IMPEDANCE STUDY N/A 09/22/2023   Procedure: IMPEDANCE PH STUDY, ESOPHAGUS;  Surgeon: Legrand Victory LITTIE DOUGLAS, MD;  Location: [facility];  Service: Gastroenterology;  Laterality: N/A;   UPPER GASTROINTESTINAL ENDOSCOPY     Patient Active Problem List   Diagnosis Date Noted   Heartburn 12/16/2023   Atypical chest pain 12/16/2023   Foot lesion 09/03/2023   Headache 07/23/2022   Cervical disc disorder with radiculopathy of cervical region 06/20/2020   Seasonal allergic rhinitis 03/05/2020   Chronic left shoulder pain 11/27/2019   GERD (gastroesophageal reflux disease) 08/10/2019   Hypertension associated with diabetes (HCC) 08/10/2019   Insomnia 06/09/2019   PICC (peripherally inserted central catheter) in place 09/05/2018   Mixed diabetic hyperlipidemia associated with type 2 diabetes mellitus (HCC) 02/28/2018   Type 2 diabetes mellitus without complications (HCC) 02/25/2018   Tobacco use disorder 04/13/2017   T.T.P. syndrome (HCC) 02/06/2017   Thrombocytopenia (HCC) 02/01/2017    PCP: Toma Matas, MD  REFERRING PROVIDER: Romelle Booty, MD; attestation by Donah Laymon PARAS, MD   REFERRING DIAG:  M54.50 (ICD-10-CM) - Low back pain without sciatica, unspecified back pain laterality, unspecified chronicity    Rationale for Evaluation and Treatment: Rehabilitation  THERAPY DIAG:  Radiculopathy, lumbar region  Muscle weakness (generalized)  ONSET DATE: 11/05/2023 date  of referral   SUBJECTIVE:                                                                                                                                                                                           SUBJECTIVE STATEMENT: Patient reports that his lower back is feeling a little better today, but that his left shoulder is hurting him the most.  EVAL: Patient reports to PT with low back pain and R hip pain that has been present for a few months. It has significantly  worsening over the past week. He did fall yesterday when standing up from the bed, and his leg gave out.   PERTINENT HISTORY:  Relevant PMHx includes DM type 2, Diverticulosis (2018), HTN, Renal insufficiency, Chronic L shoulder and cervical pain, TTP Syndrome, Thrombocytopenia   PAIN:  Are you having pain?  Yes: NPRS scale: 9/10 current Pain location: L hip  Pain description: nerve pain, pulling  Aggravating factors: sitting Relieving factors: walking   PRECAUTIONS: None  RED FLAGS: None   WEIGHT BEARING RESTRICTIONS: No  FALLS:  Has patient fallen in last 6 months? Yes; yesterday, my leg gave out on me when I was standing up from the bed in the morning.     LIVING ENVIRONMENT: 1 story home, 3 STE BIL rail Lives w/ fiancee, housework split   OCCUPATION: not working since TTP per pt report. Used to work for a company doing heavy lifting, Warden/ranger   PLOF: Independent   PATIENT GOALS: less pain, go back to work if possible    NEXT MD VISIT: after PT per pt report    OBJECTIVE:  Note: Objective measures were completed at Evaluation unless otherwise noted.   PATIENT SURVEYS:  Modified Oswestry:  MODIFIED OSWESTRY DISABILITY SCALE  Date: 12/22/2023 Score  Pain intensity 1 = The pain is bad, but I can manage without having to take (1) I can stand as long as I want but, it increases my pain. pain medication.  2. Personal care (washing, dressing, etc.) 1 =  I can take care of myself normally, but it increases my pain.  3. Lifting 0 = I can lift heavy weights without increased pain.  4. Walking 0 = Pain does not prevent me from walking any distance  5. Sitting 4 =  Pain prevents me from sitting more than 10 minutes.  6. Standing 2 =  Pain prevents me from standing more than 1 hour  7. Sleeping 2 =  Even when I take pain medication, I sleep less than 6 hours  8. Social Life 1 =  My social life is normal, but  it increases my level of pain.  9. Traveling 1 =  I can  travel anywhere, but it increases my pain.  10. Employment/ Homemaking 1 = My normal homemaking/job activities increase my pain, but I can still perform all that is required of me  Total 13/50   Interpretation of scores: Score Category Description  0-20% Minimal Disability The patient can cope with most living activities. Usually no treatment is indicated apart from advice on lifting, sitting and exercise  21-40% Moderate Disability The patient experiences more pain and difficulty with sitting, lifting and standing. Travel and social life are more difficult and they may be disabled from work. Personal care, sexual activity and sleeping are not grossly affected, and the patient can usually be managed by conservative means  41-60% Severe Disability Pain remains the main problem in this group, but activities of daily living are affected. These patients require a detailed investigation  61-80% Crippled Back pain impinges on all aspects of the patient's life. Positive intervention is required  81-100% Bed-bound  These patients are either bed-bound or exaggerating their symptoms  Bluford FORBES Zoe DELENA Karon DELENA, et al. Surgery versus conservative management of stable thoracolumbar fracture: the PRESTO feasibility RCT. [place] ([place]): [employer]; 2021 Nov. [facility] Technology Assessment, No. 25.62.) Appendix 3, Oswestry Disability Index category descriptors. Available from: FindJewel[employer].cz  Minimally Clinically Important Difference (MCID) = 12.8%  COGNITION: Overall cognitive status: Within functional limits for tasks assessed     MUSCLE LENGTH: Hamstrings: Right 75 deg; Left 30 deg  LUMBAR ROM: deferred d/t high irritability at time of evaluation   AROM eval  Flexion   Extension   Right lateral flexion   Left lateral flexion   Right rotation   Left rotation    (Blank rows = not tested)  LOWER EXTREMITY ROM:     Active  Right eval Left eval  Hip  flexion    Hip extension    Hip abduction    Hip adduction    Hip internal rotation    Hip external rotation    Knee flexion    Knee extension    Ankle dorsiflexion    Ankle plantarflexion    Ankle inversion    Ankle eversion     (Blank rows = not tested)  LOWER EXTREMITY MMT:    MMT Right eval Left eval  Hip flexion 4 4  Hip extension    Hip abduction 4+ 4-  Hip adduction    Hip internal rotation    Hip external rotation    Knee flexion    Knee extension 4+ 4+  Ankle dorsiflexion    Ankle plantarflexion    Ankle inversion    Ankle eversion     (Blank rows = not tested)  LUMBAR SPECIAL TESTS:  Straight leg raise test: Positive and Slump test: Positive  TREATMENT DATE:  [facility] Adult PT Treatment:                                                DATE: 01/14/24 Therapeutic Exercise: Nustep level 4 x 8 min at end of session Neuromuscular re-ed: Supine bridges 2x10 Supine LTR x10 BIL Sidelying open books x10 BIL Prone on elbows x3' Seated pball roll outs fwd x10 Airex pull downs GTB 2 x10 Airex isometric shoulder extension GTB + march 2 x 30 sec   [facility]  Adult PT Treatment:                                                DATE: 01/12/24  Therapeutic Exercise: Nustep level 4 x 8 min at end of session, concurrent MHP to LBP   Neuromuscular re-ed: Supine bridges 2x10 Supine LTR x10 BIL Sidelying open books x10 BIL Prone on elbows x3' Prone press ups 3x5 (small range, more painful for Lt shoulder) Low plank x 20 sec  Seated pball roll outs fwd x10 Airex pull downs GTB 2 x10 Airex isometric shoulder extension GTB + march 2 x 30 sec     PATIENT EDUCATION:  Education details: reviewed initial home exercise program; discussion of POC, prognosis and goals for skilled PT   Person educated: Patient Education method: Explanation Education comprehension: verbalized understanding, returned demonstration, and needs further education  HOME EXERCISE PROGRAM: Access Code:  [facility] URL: [email or web address] Date: 12/22/2023 Prepared by: Marko Molt  Exercises - Lying Prone  - 2 x daily - 7 x weekly - 2 sets - 10 reps - 3 sec hold - Static Prone on Elbows  - 2 x daily - 7 x weekly - 4 sets - 10 sec hold - Seated Thoracic Lumbar Extension with Pectoralis Stretch  - 2 x daily - 7 x weekly - 2 sets - 10 reps - 3 sec hold - Standing Lumbar Extension with Counter  - 2 x daily - 7 x weekly - 2 sets - 5 reps - 3 sec hold  ASSESSMENT:  CLINICAL IMPRESSION: Patient presents to PT reporting that his lower back is continuing to feel a bit better, but that his left shoulder is hurting more today. Session today focused on core, proximal hip, and periscapular strengthening. Patient was able to tolerate all prescribed exercises with no adverse effects. Patient continues to benefit from skilled PT services and should be progressed as able to improve functional independence.   EVAL: Wright is a 48 y.o. male who was seen today for physical therapy evaluation and treatment for persistent low back pain with radicular symptoms. He is demonstrating high symptom irritability with prolonged sitting, decreased lower extremity MMT scores, improved severity of symptoms/centralization of symptoms with thoracolumbar extension exercises. He requires skilled PT services at this time to address relevant deficits and improve overall function.     OBJECTIVE IMPAIRMENTS: decreased activity tolerance, decreased ROM, decreased strength, improper body mechanics, and pain.   ACTIVITY LIMITATIONS: carrying, lifting, bending, sitting, and squatting  PARTICIPATION LIMITATIONS: meal prep, cleaning, driving, shopping, community activity, and occupation  PERSONAL FACTORS: Past/current experiences, Profession, Time since onset of injury/illness/exacerbation, and 3+ comorbidities: Relevant PMHx includes DM type 2, Diverticulosis (2018), HTN, Renal insufficiency, Chronic L shoulder and  cervical pain, TTP Syndrome, Thrombocytopenia  are also affecting patient's functional outcome.   REHAB POTENTIAL: Fair    CLINICAL DECISION MAKING: Evolving/moderate complexity  EVALUATION COMPLEXITY: Moderate   GOALS: Goals reviewed with patient? YES  SHORT TERM GOALS: Target date: 01/12/2024    Patient will be independent with initial home program at least 3 days/week.  Baseline: provided at eval Goal Status: INITIAL    2.  Patient will demonstrate ability to tolerate at least 50% lumbar AROM in all directions Baseline: Unable to tolerate due to severity of symptoms and high irritability with LS motion Goal status: INITIAL   LONG TERM GOALS: Target  date: 02/02/2024    Patient will report improved overall functional ability with ODI score of 5/50 or less.  Baseline: 13/50 Goal Status: INITIAL   2.  Patient will demonstrate improved LE MMT to at least 4+/5 BIL.  Baseline: see MMT chart above  Goal status: INITIAL  3.  Patient will demonstrate ability to tolerate at least 50% lumbar AROM in all directions Baseline: Unable to tolerate due to severity of symptoms and high irritability with LS motion Goal status: INITIAL  4.  Patient will demonstrate ability to perform floor to waist lifting of at least 25# using appropriate body mechanics and with no more than minimal pain in order to safely perform normal daily/occupational tasks.   Baseline: unable to tolerate   Goal Status: INITIAL   5. Patient will report ability to tolerate sitting >30 minutes without exacerbation of LQ pain.  Baseline: Unable to tolerate; patient reports severe pain immediately with seated positions Goal Status: INITIAL   PLAN:  PT FREQUENCY: 1-2x/week  PT DURATION: 6 weeks  PLANNED INTERVENTIONS: 97164- PT Re-evaluation, 97750- Physical Performance Testing, 97110-Therapeutic exercises, 97530- Therapeutic activity, [id number]- Neuromuscular re-education, 97535- Self Care, [id number]- Manual therapy,  [id number]- Aquatic Therapy, [id number]- Electrical stimulation (unattended), 20560 (1-2 muscles), 20561 (3+ muscles)- Dry Needling, Patient/Family education, Taping, Spinal mobilization, Cryotherapy, and Moist heat.  PLAN FOR NEXT SESSION: continue with LS extension focus for centralization of symptoms; trial Nustep for low-impact aerobic activity to elicit pain modulation effects; consider thermal modalities and manual therapy (incl grade 1/2 Mob) as indicated; otherwise progress towards established goals as tolerated    Corean Pouch PTA  01/14/2024 10:44 AM

## 2024-01-17 NOTE — Therapy (Unsigned)
 OUTPATIENT PHYSICAL THERAPY TREATMENT NOTE   Patient Name: Eric Hooper MRN: [id number] DOB:03-05-1976, 48 y.o., male Today's Date: 01/19/2024  END OF SESSION:  PT End of Session - 01/19/24 1001     Visit Number 7    Number of Visits 12    Date for Recertification  02/02/24    Authorization Type [employer] [facility]    Authorization Time Period auth after 27 visits per appt notes    Authorization - Number of Visits 27    PT Start Time 1000    PT Stop Time 1038    PT Time Calculation (min) 38 min    Activity Tolerance Patient limited by pain;Patient tolerated treatment well    Behavior During Therapy [facility] for tasks assessed/performed          Past Medical History:  Diagnosis Date   At risk for dysfunction of heart 07/11/2018   Chest wall pain 08/14/2018   Controlled diabetes mellitus type 2 with complications (HCC) 02/25/2018   Diabetes mellitus (HCC) 02/01/2017   Diabetes mellitus without complication (HCC)    non-insulin  dependent   Diverticulosis 02/01/2017   on CT scan abd/pelvis   Eczema    GERD (gastroesophageal reflux disease)    Hypertension    Kidney stones 02/03/2017   Leg swelling 05/12/2017   Macrocytic anemia 02/01/2017   Microscopic hematuria 02/01/2017   Mixed hyperlipidemia 02/28/2018   Phlebitis 05/12/2017   Recurrent upper respiratory infection (URI)    Renal insufficiency 02/03/2017   Smoker 04/13/2017   Past Surgical History:  Procedure Laterality Date   COLONOSCOPY     ELBOW SURGERY Right    ESOPHAGEAL MANOMETRY N/A 09/22/2023   Procedure: MANOMETRY, ESOPHAGUS;  Surgeon: Legrand Victory LITTIE DOUGLAS, MD;  Location: [facility];  Service: Gastroenterology;  Laterality: N/A;   IR FLUORO GUIDE CV LINE RIGHT  02/02/2017   IR FLUORO GUIDE CV LINE RIGHT  11/23/2021   IR FLUORO GUIDE CV LINE RIGHT  11/28/2021   IR REMOVAL TUN CV CATH W/O FL  02/26/2022   IR US  GUIDE VASC ACCESS RIGHT  02/02/2017   IR US  GUIDE VASC ACCESS RIGHT  11/23/2021   PH  IMPEDANCE STUDY N/A 09/22/2023   Procedure: IMPEDANCE PH STUDY, ESOPHAGUS;  Surgeon: Legrand Victory LITTIE DOUGLAS, MD;  Location: [facility];  Service: Gastroenterology;  Laterality: N/A;   UPPER GASTROINTESTINAL ENDOSCOPY     Patient Active Problem List   Diagnosis Date Noted   Heartburn 12/16/2023   Atypical chest pain 12/16/2023   Foot lesion 09/03/2023   Headache 07/23/2022   Cervical disc disorder with radiculopathy of cervical region 06/20/2020   Seasonal allergic rhinitis 03/05/2020   Chronic left shoulder pain 11/27/2019   GERD (gastroesophageal reflux disease) 08/10/2019   Hypertension associated with diabetes (HCC) 08/10/2019   Insomnia 06/09/2019   PICC (peripherally inserted central catheter) in place 09/05/2018   Mixed diabetic hyperlipidemia associated with type 2 diabetes mellitus (HCC) 02/28/2018   Type 2 diabetes mellitus without complications (HCC) 02/25/2018   Tobacco use disorder 04/13/2017   T.T.P. syndrome (HCC) 02/06/2017   Thrombocytopenia 02/01/2017    PCP: Toma Matas, MD  REFERRING PROVIDER: Romelle Booty, MD; attestation by Donah Laymon PARAS, MD   REFERRING DIAG:  M54.50 (ICD-10-CM) - Low back pain without sciatica, unspecified back pain laterality, unspecified chronicity    Rationale for Evaluation and Treatment: Rehabilitation  THERAPY DIAG:  Radiculopathy, lumbar region  Muscle weakness (generalized)  Cervicalgia  ONSET DATE: 11/05/2023 date of referral   SUBJECTIVE:  SUBJECTIVE STATEMENT:  Reports no change in back symptoms, 6/10 level of discomfort.  Symptoms aggravated by prolonged sitting and improved with position changes.  EVAL: Patient reports to PT with low back pain and R hip pain that has been present for a few months. It has significantly  worsening over the past week. He did fall yesterday when standing up from the bed, and his leg gave out.   PERTINENT HISTORY:  Relevant PMHx includes DM type 2, Diverticulosis (2018), HTN, Renal insufficiency, Chronic L shoulder and cervical pain, TTP Syndrome, Thrombocytopenia   PAIN:  Are you having pain?  Yes: NPRS scale: 9/10 current Pain location: L hip  Pain description: nerve pain, pulling  Aggravating factors: sitting Relieving factors: walking   PRECAUTIONS: None  RED FLAGS: None   WEIGHT BEARING RESTRICTIONS: No  FALLS:  Has patient fallen in last 6 months? Yes; yesterday, my leg gave out on me when I was standing up from the bed in the morning.     LIVING ENVIRONMENT: 1 story home, 3 STE BIL rail Lives w/ fiancee, housework split   OCCUPATION: not working since TTP per pt report. Used to work for a company doing heavy lifting, Warden/ranger   PLOF: Independent   PATIENT GOALS: less pain, go back to work if possible    NEXT MD VISIT: after PT per pt report    OBJECTIVE:  Note: Objective measures were completed at Evaluation unless otherwise noted.   PATIENT SURVEYS:  Modified Oswestry:  MODIFIED OSWESTRY DISABILITY SCALE  Date: 12/22/2023 Score  Pain intensity 1 = The pain is bad, but I can manage without having to take (1) I can stand as long as I want but, it increases my pain. pain medication.  2. Personal care (washing, dressing, etc.) 1 =  I can take care of myself normally, but it increases my pain.  3. Lifting 0 = I can lift heavy weights without increased pain.  4. Walking 0 = Pain does not prevent me from walking any distance  5. Sitting 4 =  Pain prevents me from sitting more than 10 minutes.  6. Standing 2 =  Pain prevents me from standing more than 1 hour  7. Sleeping 2 =  Even when I take pain medication, I sleep less than 6 hours  8. Social Life 1 =  My social life is normal, but it increases my level of pain.  9. Traveling 1 =  I can  travel anywhere, but it increases my pain.  10. Employment/ Homemaking 1 = My normal homemaking/job activities increase my pain, but I can still perform all that is required of me  Total 13/50   Interpretation of scores: Score Category Description  0-20% Minimal Disability The patient can cope with most living activities. Usually no treatment is indicated apart from advice on lifting, sitting and exercise  21-40% Moderate Disability The patient experiences more pain and difficulty with sitting, lifting and standing. Travel and social life are more difficult and they may be disabled from work. Personal care, sexual activity and sleeping are not grossly affected, and the patient can usually be managed by conservative means  41-60% Severe Disability Pain remains the main problem in this group, but activities of daily living are affected. These patients require a detailed investigation  61-80% Crippled Back pain impinges on all aspects of the patient's life. Positive intervention is required  81-100% Bed-bound  These patients are either bed-bound or exaggerating their symptoms  [place]  A, Booth A, et al. Surgery versus conservative management of stable thoracolumbar fracture: the PRESTO feasibility RCT. [place] ([place]): [employer]; 2021 Nov. [facility] Technology Assessment, No. 25.62.) Appendix 3, Oswestry Disability Index category descriptors. Available from: FindJewel[employer].cz  Minimally Clinically Important Difference (MCID) = 12.8%  COGNITION: Overall cognitive status: Within functional limits for tasks assessed     MUSCLE LENGTH: Hamstrings: Right 75 deg; Left 30 deg  LUMBAR ROM: deferred d/t high irritability at time of evaluation   AROM eval  Flexion   Extension   Right lateral flexion   Left lateral flexion   Right rotation   Left rotation    (Blank rows = not tested)  LOWER EXTREMITY ROM:     Active  Right eval Left eval  Hip  flexion    Hip extension    Hip abduction    Hip adduction    Hip internal rotation    Hip external rotation    Knee flexion    Knee extension    Ankle dorsiflexion    Ankle plantarflexion    Ankle inversion    Ankle eversion     (Blank rows = not tested)  LOWER EXTREMITY MMT:    MMT Right eval Left eval  Hip flexion 4 4  Hip extension    Hip abduction 4+ 4-  Hip adduction    Hip internal rotation    Hip external rotation    Knee flexion    Knee extension 4+ 4+  Ankle dorsiflexion    Ankle plantarflexion    Ankle inversion    Ankle eversion     (Blank rows = not tested)  LUMBAR SPECIAL TESTS:  Straight leg raise test: Positive and Slump test: Positive  TREATMENT DATE:  [facility] Adult PT Treatment:                                                DATE: 01/19/24 Therapeutic Exercise: Nustep level 4 x 8 min at end of session  Neuromuscular re-ed: Supine bridges 2x12 Supine LTR x10 BIL Sidelying open books x10 BIL Prone on elbows x3' Seated pball roll outs fwd x10 Airex pull downs GTB 2 x12 Airex isometric shoulder extension GTB + march 2 x 30 sec   [facility] Adult PT Treatment:                                                DATE: 01/14/24 Therapeutic Exercise: Nustep level 4 x 8 min at end of session Neuromuscular re-ed: Supine bridges 2x10 Supine LTR x10 BIL Sidelying open books x10 BIL Prone on elbows x3' Seated pball roll outs fwd x10 Airex pull downs GTB 2 x10 Airex isometric shoulder extension GTB + march 2 x 30 sec   [facility] Adult PT Treatment:                                                DATE: 01/12/24  Therapeutic Exercise: Nustep level 4 x 8 min at end of session, concurrent MHP to LBP   Neuromuscular re-ed: Supine bridges 2x10 Supine LTR x10 BIL Sidelying open  books x10 BIL Prone on elbows x3' Prone press ups 3x5 (small range, more painful for Lt shoulder) Low plank x 20 sec  Seated pball roll outs fwd x10 Airex pull downs GTB 2 x10 Airex isometric  shoulder extension GTB + march 2 x 30 sec     PATIENT EDUCATION:  Education details: reviewed initial home exercise program; discussion of POC, prognosis and goals for skilled PT   Person educated: Patient Education method: Explanation Education comprehension: verbalized understanding, returned demonstration, and needs further education  HOME EXERCISE PROGRAM: Access Code: [facility] URL: [email or web address] Date: 12/22/2023 Prepared by: Marko Molt  Exercises - Lying Prone  - 2 x daily - 7 x weekly - 2 sets - 10 reps - 3 sec hold - Static Prone on Elbows  - 2 x daily - 7 x weekly - 4 sets - 10 sec hold - Seated Thoracic Lumbar Extension with Pectoralis Stretch  - 2 x daily - 7 x weekly - 2 sets - 10 reps - 3 sec hold - Standing Lumbar Extension with Counter  - 2 x daily - 7 x weekly - 2 sets - 5 reps - 3 sec hold  ASSESSMENT:  CLINICAL IMPRESSION:  Continued moderate levels of low back and L hip pain.  Continued with POC adding reps as noted.  Able to tolerate al tasks w/o aggravation of symptoms.  EVAL: Merced is a 48 y.o. male who was seen today for physical therapy evaluation and treatment for persistent low back pain with radicular symptoms. He is demonstrating high symptom irritability with prolonged sitting, decreased lower extremity MMT scores, improved severity of symptoms/centralization of symptoms with thoracolumbar extension exercises. He requires skilled PT services at this time to address relevant deficits and improve overall function.     OBJECTIVE IMPAIRMENTS: decreased activity tolerance, decreased ROM, decreased strength, improper body mechanics, and pain.   ACTIVITY LIMITATIONS: carrying, lifting, bending, sitting, and squatting  PARTICIPATION LIMITATIONS: meal prep, cleaning, driving, shopping, community activity, and occupation  PERSONAL FACTORS: Past/current experiences, Profession, Time since onset of injury/illness/exacerbation, and 3+  comorbidities: Relevant PMHx includes DM type 2, Diverticulosis (2018), HTN, Renal insufficiency, Chronic L shoulder and cervical pain, TTP Syndrome, Thrombocytopenia  are also affecting patient's functional outcome.   REHAB POTENTIAL: Fair    CLINICAL DECISION MAKING: Evolving/moderate complexity  EVALUATION COMPLEXITY: Moderate   GOALS: Goals reviewed with patient? YES  SHORT TERM GOALS: Target date: 01/12/2024    Patient will be independent with initial home program at least 3 days/week.  Baseline: provided at eval Goal Status: INITIAL    2.  Patient will demonstrate ability to tolerate at least 50% lumbar AROM in all directions Baseline: Unable to tolerate due to severity of symptoms and high irritability with LS motion Goal status: INITIAL   LONG TERM GOALS: Target date: 02/02/2024    Patient will report improved overall functional ability with ODI score of 5/50 or less.  Baseline: 13/50 Goal Status: INITIAL   2.  Patient will demonstrate improved LE MMT to at least 4+/5 BIL.  Baseline: see MMT chart above  Goal status: INITIAL  3.  Patient will demonstrate ability to tolerate at least 50% lumbar AROM in all directions Baseline: Unable to tolerate due to severity of symptoms and high irritability with LS motion Goal status: INITIAL  4.  Patient will demonstrate ability to perform floor to waist lifting of at least 25# using appropriate body mechanics and with no more than minimal pain in order to safely perform normal  daily/occupational tasks.   Baseline: unable to tolerate   Goal Status: INITIAL   5. Patient will report ability to tolerate sitting >30 minutes without exacerbation of LQ pain.  Baseline: Unable to tolerate; patient reports severe pain immediately with seated positions Goal Status: INITIAL   PLAN:  PT FREQUENCY: 1-2x/week  PT DURATION: 6 weeks  PLANNED INTERVENTIONS: 97164- PT Re-evaluation, 97750- Physical Performance Testing,  97110-Therapeutic exercises, 97530- Therapeutic activity, [id number]- Neuromuscular re-education, 97535- Self Care, [id number]- Manual therapy, [id number]- Aquatic Therapy, [id number]- Electrical stimulation (unattended), 20560 (1-2 muscles), 20561 (3+ muscles)- Dry Needling, Patient/Family education, Taping, Spinal mobilization, Cryotherapy, and Moist heat.  PLAN FOR NEXT SESSION: continue with LS extension focus for centralization of symptoms; trial Nustep for low-impact aerobic activity to elicit pain modulation effects; consider thermal modalities and manual therapy (incl grade 1/2 Mob) as indicated; otherwise progress towards established goals as tolerated    Pema Thomure M Deandre Brannan PT  01/19/2024 10:44 AM

## 2024-01-18 ENCOUNTER — Ambulatory Visit

## 2024-01-18 ENCOUNTER — Other Ambulatory Visit: Payer: Self-pay | Admitting: Allergy & Immunology

## 2024-01-19 ENCOUNTER — Ambulatory Visit

## 2024-01-19 DIAGNOSIS — M542 Cervicalgia: Secondary | ICD-10-CM | POA: Diagnosis not present

## 2024-01-19 DIAGNOSIS — M5416 Radiculopathy, lumbar region: Secondary | ICD-10-CM

## 2024-01-19 DIAGNOSIS — M6281 Muscle weakness (generalized): Secondary | ICD-10-CM

## 2024-01-19 DIAGNOSIS — M25512 Pain in left shoulder: Secondary | ICD-10-CM | POA: Diagnosis not present

## 2024-01-20 ENCOUNTER — Ambulatory Visit: Admitting: Physical Therapy

## 2024-01-24 DIAGNOSIS — R448 Other symptoms and signs involving general sensations and perceptions: Secondary | ICD-10-CM | POA: Diagnosis not present

## 2024-01-24 DIAGNOSIS — R09A2 Foreign body sensation, throat: Secondary | ICD-10-CM | POA: Diagnosis not present

## 2024-01-24 DIAGNOSIS — J324 Chronic pansinusitis: Secondary | ICD-10-CM | POA: Diagnosis not present

## 2024-01-24 DIAGNOSIS — J3489 Other specified disorders of nose and nasal sinuses: Secondary | ICD-10-CM | POA: Diagnosis not present

## 2024-01-24 DIAGNOSIS — D696 Thrombocytopenia, unspecified: Secondary | ICD-10-CM | POA: Diagnosis not present

## 2024-01-24 DIAGNOSIS — J343 Hypertrophy of nasal turbinates: Secondary | ICD-10-CM | POA: Diagnosis not present

## 2024-01-25 ENCOUNTER — Ambulatory Visit

## 2024-01-25 DIAGNOSIS — M542 Cervicalgia: Secondary | ICD-10-CM

## 2024-01-25 DIAGNOSIS — M6281 Muscle weakness (generalized): Secondary | ICD-10-CM | POA: Diagnosis not present

## 2024-01-25 DIAGNOSIS — M25512 Pain in left shoulder: Secondary | ICD-10-CM | POA: Diagnosis not present

## 2024-01-25 DIAGNOSIS — M5416 Radiculopathy, lumbar region: Secondary | ICD-10-CM | POA: Diagnosis not present

## 2024-01-25 NOTE — Therapy (Signed)
 OUTPATIENT PHYSICAL THERAPY TREATMENT NOTE   Patient Name: Eric Hooper MRN: [id number] DOB:1975/12/10, 48 y.o., male Today's Date: 01/25/2024  END OF SESSION:  PT End of Session - 01/25/24 1009     Visit Number 8    Number of Visits 12    Date for Recertification  02/02/24    Authorization Type [employer]    Authorization Time Period auth after 27 visits per appt notes    Authorization - Visit Number 15    Authorization - Number of Visits 27    PT Start Time 1005    PT Stop Time 1043    PT Time Calculation (min) 38 min    Activity Tolerance Patient limited by pain;Patient tolerated treatment well    Behavior During Therapy [facility] for tasks assessed/performed           Past Medical History:  Diagnosis Date   At risk for dysfunction of heart 07/11/2018   Chest wall pain 08/14/2018   Controlled diabetes mellitus type 2 with complications (HCC) 02/25/2018   Diabetes mellitus (HCC) 02/01/2017   Diabetes mellitus without complication (HCC)    non-insulin  dependent   Diverticulosis 02/01/2017   on CT scan abd/pelvis   Eczema    GERD (gastroesophageal reflux disease)    Hypertension    Kidney stones 02/03/2017   Leg swelling 05/12/2017   Macrocytic anemia 02/01/2017   Microscopic hematuria 02/01/2017   Mixed hyperlipidemia 02/28/2018   Phlebitis 05/12/2017   Recurrent upper respiratory infection (URI)    Renal insufficiency 02/03/2017   Smoker 04/13/2017   Past Surgical History:  Procedure Laterality Date   COLONOSCOPY     ELBOW SURGERY Right    ESOPHAGEAL MANOMETRY N/A 09/22/2023   Procedure: MANOMETRY, ESOPHAGUS;  Surgeon: Legrand Victory LITTIE DOUGLAS, MD;  Location: [facility];  Service: Gastroenterology;  Laterality: N/A;   IR FLUORO GUIDE CV LINE RIGHT  02/02/2017   IR FLUORO GUIDE CV LINE RIGHT  11/23/2021   IR FLUORO GUIDE CV LINE RIGHT  11/28/2021   IR REMOVAL TUN CV CATH W/O FL  02/26/2022   IR US  GUIDE VASC ACCESS RIGHT  02/02/2017   IR US  GUIDE VASC  ACCESS RIGHT  11/23/2021   PH IMPEDANCE STUDY N/A 09/22/2023   Procedure: IMPEDANCE PH STUDY, ESOPHAGUS;  Surgeon: Legrand Victory LITTIE DOUGLAS, MD;  Location: [facility];  Service: Gastroenterology;  Laterality: N/A;   UPPER GASTROINTESTINAL ENDOSCOPY     Patient Active Problem List   Diagnosis Date Noted   Heartburn 12/16/2023   Atypical chest pain 12/16/2023   Foot lesion 09/03/2023   Headache 07/23/2022   Cervical disc disorder with radiculopathy of cervical region 06/20/2020   Seasonal allergic rhinitis 03/05/2020   Chronic left shoulder pain 11/27/2019   GERD (gastroesophageal reflux disease) 08/10/2019   Hypertension associated with diabetes (HCC) 08/10/2019   Insomnia 06/09/2019   PICC (peripherally inserted central catheter) in place 09/05/2018   Mixed diabetic hyperlipidemia associated with type 2 diabetes mellitus (HCC) 02/28/2018   Type 2 diabetes mellitus without complications (HCC) 02/25/2018   Tobacco use disorder 04/13/2017   T.T.P. syndrome (HCC) 02/06/2017   Thrombocytopenia 02/01/2017    PCP: Toma Matas, MD  REFERRING PROVIDER: Romelle Booty, MD; attestation by Donah Laymon PARAS, MD   REFERRING DIAG:  M54.50 (ICD-10-CM) - Low back pain without sciatica, unspecified back pain laterality, unspecified chronicity    Rationale for Evaluation and Treatment: Rehabilitation  THERAPY DIAG:  Radiculopathy, lumbar region  Muscle weakness (generalized)  Cervicalgia  Left  shoulder pain, unspecified chronicity  ONSET DATE: 11/05/2023 date of referral   SUBJECTIVE:                                                                                                                                                                                           SUBJECTIVE STATEMENT:    Patient reports that his back pain is worse this morning d/t the rain  EVAL: Patient reports to PT with low back pain and R hip pain that has been present for a few months. It has  significantly worsening over the past week. He did fall yesterday when standing up from the bed, and his leg gave out.   PERTINENT HISTORY:  Relevant PMHx includes DM type 2, Diverticulosis (2018), HTN, Renal insufficiency, Chronic L shoulder and cervical pain, TTP Syndrome, Thrombocytopenia   PAIN:  Are you having pain?  Yes: NPRS scale: 9/10 current Pain location: L hip  Pain description: nerve pain, pulling  Aggravating factors: sitting Relieving factors: walking   PRECAUTIONS: None  RED FLAGS: None   WEIGHT BEARING RESTRICTIONS: No  FALLS:  Has patient fallen in last 6 months? Yes; yesterday, my leg gave out on me when I was standing up from the bed in the morning.     LIVING ENVIRONMENT: 1 story home, 3 STE BIL rail Lives w/ fiancee, housework split   OCCUPATION: not working since TTP per pt report. Used to work for a company doing heavy lifting, Warden/ranger   PLOF: Independent   PATIENT GOALS: less pain, go back to work if possible    NEXT MD VISIT: after PT per pt report    OBJECTIVE:  Note: Objective measures were completed at Evaluation unless otherwise noted.   PATIENT SURVEYS:  Modified Oswestry:  MODIFIED OSWESTRY DISABILITY SCALE  Date: 12/22/2023 Score  Pain intensity 1 = The pain is bad, but I can manage without having to take (1) I can stand as long as I want but, it increases my pain. pain medication.  2. Personal care (washing, dressing, etc.) 1 =  I can take care of myself normally, but it increases my pain.  3. Lifting 0 = I can lift heavy weights without increased pain.  4. Walking 0 = Pain does not prevent me from walking any distance  5. Sitting 4 =  Pain prevents me from sitting more than 10 minutes.  6. Standing 2 =  Pain prevents me from standing more than 1 hour  7. Sleeping 2 =  Even when I take pain medication, I sleep less than 6 hours  8. Social Life 1 =  My social life  is normal, but it increases my level of pain.  9. Traveling  1 =  I can travel anywhere, but it increases my pain.  10. Employment/ Homemaking 1 = My normal homemaking/job activities increase my pain, but I can still perform all that is required of me  Total 13/50   Interpretation of scores: Score Category Description  0-20% Minimal Disability The patient can cope with most living activities. Usually no treatment is indicated apart from advice on lifting, sitting and exercise  21-40% Moderate Disability The patient experiences more pain and difficulty with sitting, lifting and standing. Travel and social life are more difficult and they may be disabled from work. Personal care, sexual activity and sleeping are not grossly affected, and the patient can usually be managed by conservative means  41-60% Severe Disability Pain remains the main problem in this group, but activities of daily living are affected. These patients require a detailed investigation  61-80% Crippled Back pain impinges on all aspects of the patient's life. Positive intervention is required  81-100% Bed-bound  These patients are either bed-bound or exaggerating their symptoms  Bluford FORBES Zoe DELENA Karon DELENA, et al. Surgery versus conservative management of stable thoracolumbar fracture: the PRESTO feasibility RCT. [place] ([place]): [employer]; 2021 Nov. [facility] Technology Assessment, No. 25.62.) Appendix 3, Oswestry Disability Index category descriptors. Available from: FindJewelers.cz  Minimally Clinically Important Difference (MCID) = 12.8%  COGNITION: Overall cognitive status: Within functional limits for tasks assessed     MUSCLE LENGTH: Hamstrings: Right 75 deg; Left 30 deg  LUMBAR ROM: deferred d/t high irritability at time of evaluation   AROM eval  Flexion   Extension   Right lateral flexion   Left lateral flexion   Right rotation   Left rotation    (Blank rows = not tested)  LOWER EXTREMITY ROM:     Active  Right eval  Left eval  Hip flexion    Hip extension    Hip abduction    Hip adduction    Hip internal rotation    Hip external rotation    Knee flexion    Knee extension    Ankle dorsiflexion    Ankle plantarflexion    Ankle inversion    Ankle eversion     (Blank rows = not tested)  LOWER EXTREMITY MMT:    MMT Right eval Left eval  Hip flexion 4 4  Hip extension    Hip abduction 4+ 4-  Hip adduction    Hip internal rotation    Hip external rotation    Knee flexion    Knee extension 4+ 4+  Ankle dorsiflexion    Ankle plantarflexion    Ankle inversion    Ankle eversion     (Blank rows = not tested)  LUMBAR SPECIAL TESTS:  Straight leg raise test: Positive and Slump test: Positive  TREATMENT DATE:   [facility] Adult PT Treatment:                                                DATE: 01/20/24  Therapeutic Exercise: Nustep level 5 x 8 min at end of session  Neuromuscular re-ed: Supine bridges 2x12 Supine LTR x10 BIL Sidelying open books x10 BIL Prone on elbows x3' Prone hip extension knee extension 3 x 5 each  Prone hip extension knee flexed 3 x 5 each  Seated pball roll outs fwd x6, x 6 L lateral flexion   Manual Therapy  Grade II PA mobilization from L2-L5 levels  Grade II right to left UPA at L3-L5 levels      PATIENT EDUCATION:  Education details: reviewed initial home exercise program; discussion of POC, prognosis and goals for skilled PT   Person educated: Patient Education method: Explanation Education comprehension: verbalized understanding, returned demonstration, and needs further education  HOME EXERCISE PROGRAM: Access Code: [facility] URL: [email or web address] Date: 12/22/2023 Prepared by: Marko Molt  Exercises - Lying Prone  - 2 x daily - 7 x weekly - 2 sets - 10 reps - 3 sec hold - Static Prone on Elbows  - 2 x daily - 7 x weekly - 4 sets - 10 sec hold - Seated Thoracic Lumbar Extension with Pectoralis Stretch  - 2 x daily - 7 x  weekly - 2 sets - 10 reps - 3 sec hold - Standing Lumbar Extension with Counter  - 2 x daily - 7 x weekly - 2 sets - 5 reps - 3 sec hold  ASSESSMENT:  CLINICAL IMPRESSION:    Patient reporting improved pain severity at end of today's session.  He responded best to manual therapy and prone extension activities today. Although moving through all planes of motions seem to be effective for pain modulation at this time. We will continue to progress with dynamic stabilization as tolerated. Plan is to perform reassessment of progression towards established rehab goals.   EVAL: Dillon is a 48 y.o. male who was seen today for physical therapy evaluation and treatment for persistent low back pain with radicular symptoms. He is demonstrating high symptom irritability with prolonged sitting, decreased lower extremity MMT scores, improved severity of symptoms/centralization of symptoms with thoracolumbar extension exercises. He requires skilled PT services at this time to address relevant deficits and improve overall function.     OBJECTIVE IMPAIRMENTS: decreased activity tolerance, decreased ROM, decreased strength, improper body mechanics, and pain.   ACTIVITY LIMITATIONS: carrying, lifting, bending, sitting, and squatting  PARTICIPATION LIMITATIONS: meal prep, cleaning, driving, shopping, community activity, and occupation  PERSONAL FACTORS: Past/current experiences, Profession, Time since onset of injury/illness/exacerbation, and 3+ comorbidities: Relevant PMHx includes DM type 2, Diverticulosis (2018), HTN, Renal insufficiency, Chronic L shoulder and cervical pain, TTP Syndrome, Thrombocytopenia  are also affecting patient's functional outcome.   REHAB POTENTIAL: Fair    CLINICAL DECISION MAKING: Evolving/moderate complexity  EVALUATION COMPLEXITY: Moderate   GOALS: Goals reviewed with patient? YES  SHORT TERM GOALS: Target date: 01/12/2024    Patient will be independent with initial home  program at least 3 days/week.  Baseline: provided at eval Goal Status: INITIAL    2.  Patient will demonstrate ability to tolerate at least 50% lumbar AROM in all directions Baseline: Unable to tolerate due to severity of symptoms and high irritability with LS motion Goal status: INITIAL   LONG TERM GOALS: Target date: 02/02/2024    Patient will report improved overall functional ability with ODI score of 5/50 or less.  Baseline: 13/50 Goal Status: INITIAL   2.  Patient will demonstrate improved LE MMT to at least 4+/5 BIL.  Baseline: see MMT chart above  Goal status: INITIAL  3.  Patient will demonstrate ability to tolerate at least 50% lumbar AROM in all directions Baseline: Unable to tolerate due to severity of symptoms and high irritability with LS motion Goal status: INITIAL  4.  Patient will demonstrate ability to perform floor to waist  lifting of at least 25# using appropriate body mechanics and with no more than minimal pain in order to safely perform normal daily/occupational tasks.   Baseline: unable to tolerate   Goal Status: INITIAL   5. Patient will report ability to tolerate sitting >30 minutes without exacerbation of LQ pain.  Baseline: Unable to tolerate; patient reports severe pain immediately with seated positions Goal Status: INITIAL   PLAN:  PT FREQUENCY: 1-2x/week  PT DURATION: 6 weeks  PLANNED INTERVENTIONS: 97164- PT Re-evaluation, 97750- Physical Performance Testing, 97110-Therapeutic exercises, 97530- Therapeutic activity, [id number]- Neuromuscular re-education, 97535- Self Care, 02859- Manual therapy, [id number]- Aquatic Therapy, H9716- Electrical stimulation (unattended), 20560 (1-2 muscles), 20561 (3+ muscles)- Dry Needling, Patient/Family education, Taping, Spinal mobilization, Cryotherapy, and Moist heat.  PLAN FOR NEXT SESSION: continue with LS extension focus for centralization of symptoms; trial Nustep for low-impact aerobic activity to elicit pain  modulation effects; consider thermal modalities and manual therapy (incl grade 1/2 Mob) as indicated; otherwise progress towards established goals as tolerated    Marko Molt, PT, DPT  01/25/2024 12:17 PM

## 2024-01-27 ENCOUNTER — Ambulatory Visit

## 2024-02-03 ENCOUNTER — Other Ambulatory Visit: Payer: Self-pay | Admitting: Otolaryngology

## 2024-02-03 NOTE — Telephone Encounter (Addendum)
 Spoke to Eric Hooper. FESS (endoscopic sinus) surgery scheduled on 03/15/2024 at [facility]. Post op 1st Debridement 03/22/2024 at 9:30 am/2nd Debridement 03/29/2024 at 9:30 am. Advised I will email him the information for surgery a week before.

## 2024-02-03 NOTE — Telephone Encounter (Signed)
????????????????????????    Patient:?  Eric Hooper DOB:?  1976/04/08  Sex:   male EMRN:?  [id number]  Encounter Date:  @date @     ?  ?  Orders  ?  FACILITY:??  [facility] ?  SURGEON:?? SKOTNICKI ?  SURGERY DATE:? 03/15/2024  ???????????TIME: ??10:00 AM????????????????????   ?  ANESTHESIA:?? GENERAL ?  STATUS:?? OUTPATIENT ?  PROCEDURE:?? FESS: MAXILLLARY ANTROSTOMY-31256, TOTAL ETHMOIDECTOMY  -31255, LEFT FRONTAL RECESS EXPLORATION, 31276, BILATERAL TURBINATE REDUCTION-30140, NAVIGATION-61782 ?  DIAGNOSIS:? Chronic pansinusitis J32.4,  Thrombocytopenia D69.6,Globus sensation R09.A2, Hypertrophy of inferior nasal turbinate J34.3 ASSISTANT: ?  ?  [employer] : ?  ?  EQUIPMENT: ????????????  ?  INSURANCE:? [place] [employer] # [id number]   SECONDARY:?????  ??  PRECERT#: ?

## 2024-02-07 ENCOUNTER — Other Ambulatory Visit: Payer: Self-pay | Admitting: Otolaryngology

## 2024-02-09 ENCOUNTER — Ambulatory Visit: Payer: Self-pay | Attending: Physical Medicine and Rehabilitation

## 2024-02-09 DIAGNOSIS — M6281 Muscle weakness (generalized): Secondary | ICD-10-CM | POA: Diagnosis not present

## 2024-02-09 DIAGNOSIS — M5416 Radiculopathy, lumbar region: Secondary | ICD-10-CM | POA: Insufficient documentation

## 2024-02-09 NOTE — Therapy (Signed)
 OUTPATIENT PHYSICAL THERAPY TREATMENT NOTE   Patient Name: Eric Hooper MRN: [id number] DOB:1975-05-30, 48 y.o., male Today's Date: 02/09/2024  END OF SESSION:  PT End of Session - 02/09/24 1139     Visit Number 9    Number of Visits 12    Authorization Type [employer]    Authorization Time Period auth after 27 visits per appt notes    Authorization - Visit Number 16    Authorization - Number of Visits 27    PT Start Time 1139    PT Stop Time 1210    PT Time Calculation (min) 31 min    Behavior During Therapy [facility] for tasks assessed/performed            Past Medical History:  Diagnosis Date   At risk for dysfunction of heart 07/11/2018   Chest wall pain 08/14/2018   Controlled diabetes mellitus type 2 with complications (HCC) 02/25/2018   Diabetes mellitus (HCC) 02/01/2017   Diabetes mellitus without complication (HCC)    non-insulin  dependent   Diverticulosis 02/01/2017   on CT scan abd/pelvis   Eczema    GERD (gastroesophageal reflux disease)    Hypertension    Kidney stones 02/03/2017   Leg swelling 05/12/2017   Macrocytic anemia 02/01/2017   Microscopic hematuria 02/01/2017   Mixed hyperlipidemia 02/28/2018   Phlebitis 05/12/2017   Recurrent upper respiratory infection (URI)    Renal insufficiency 02/03/2017   Smoker 04/13/2017   Past Surgical History:  Procedure Laterality Date   COLONOSCOPY     ELBOW SURGERY Right    ESOPHAGEAL MANOMETRY N/A 09/22/2023   Procedure: MANOMETRY, ESOPHAGUS;  Surgeon: Legrand Victory LITTIE DOUGLAS, MD;  Location: [facility];  Service: Gastroenterology;  Laterality: N/A;   IR FLUORO GUIDE CV LINE RIGHT  02/02/2017   IR FLUORO GUIDE CV LINE RIGHT  11/23/2021   IR FLUORO GUIDE CV LINE RIGHT  11/28/2021   IR REMOVAL TUN CV CATH W/O FL  02/26/2022   IR US  GUIDE VASC ACCESS RIGHT  02/02/2017   IR US  GUIDE VASC ACCESS RIGHT  11/23/2021   PH IMPEDANCE STUDY N/A 09/22/2023   Procedure: IMPEDANCE PH STUDY, ESOPHAGUS;  Surgeon:  Legrand Victory LITTIE DOUGLAS, MD;  Location: [facility];  Service: Gastroenterology;  Laterality: N/A;   UPPER GASTROINTESTINAL ENDOSCOPY     Patient Active Problem List   Diagnosis Date Noted   Heartburn 12/16/2023   Atypical chest pain 12/16/2023   Foot lesion 09/03/2023   Headache 07/23/2022   Cervical disc disorder with radiculopathy of cervical region 06/20/2020   Seasonal allergic rhinitis 03/05/2020   Chronic left shoulder pain 11/27/2019   GERD (gastroesophageal reflux disease) 08/10/2019   Hypertension associated with diabetes (HCC) 08/10/2019   Insomnia 06/09/2019   PICC (peripherally inserted central catheter) in place 09/05/2018   Mixed diabetic hyperlipidemia associated with type 2 diabetes mellitus (HCC) 02/28/2018   Type 2 diabetes mellitus without complications (HCC) 02/25/2018   Tobacco use disorder 04/13/2017   T.T.P. syndrome (HCC) 02/06/2017   Thrombocytopenia 02/01/2017    PCP: Toma Matas, MD  REFERRING PROVIDER: Romelle Booty, MD; attestation by Donah Laymon PARAS, MD  REFERRING DIAG: M54.50 (ICD-10-CM) - Low back pain without sciatica, unspecified back pain laterality, unspecified chronicity   Rationale for Evaluation and Treatment: Rehabilitation  THERAPY DIAG:  Radiculopathy, lumbar region  Muscle weakness (generalized)  ONSET DATE: 11/05/2023 date of referral   SUBJECTIVE:  SUBJECTIVE STATEMENT:    Patient reports to PT with 3/10 pain at end of day.   EVAL: Patient reports to PT with low back pain and R hip pain that has been present for a few months. It has significantly worsening over the past week. He did fall yesterday when standing up from the bed, and his leg gave out.   PERTINENT HISTORY:  Relevant PMHx includes DM type 2, Diverticulosis (2018), HTN,  Renal insufficiency, Chronic L shoulder and cervical pain, TTP Syndrome, Thrombocytopenia   PAIN:  Are you having pain?  Yes: NPRS scale: 9/10 current Pain location: L hip  Pain description: nerve pain, pulling  Aggravating factors: sitting Relieving factors: walking   PRECAUTIONS: None  RED FLAGS: None   WEIGHT BEARING RESTRICTIONS: No  FALLS:  Has patient fallen in last 6 months? Yes; yesterday, my leg gave out on me when I was standing up from the bed in the morning.     LIVING ENVIRONMENT: 1 story home, 3 STE BIL rail Lives w/ fiancee, housework split   OCCUPATION: not working since TTP per pt report. Used to work for a company doing heavy lifting, Warden/ranger   PLOF: Independent   PATIENT GOALS: less pain, go back to work if possible    NEXT MD VISIT: after PT per pt report    OBJECTIVE:  Note: Objective measures were completed at Evaluation unless otherwise noted.   PATIENT SURVEYS:  Modified Oswestry:  MODIFIED OSWESTRY DISABILITY SCALE  Date: 12/22/2023 Score  Pain intensity 1 = The pain is bad, but I can manage without having to take (1) I can stand as long as I want but, it increases my pain. pain medication.  2. Personal care (washing, dressing, etc.) 1 =  I can take care of myself normally, but it increases my pain.  3. Lifting 0 = I can lift heavy weights without increased pain.  4. Walking 0 = Pain does not prevent me from walking any distance  5. Sitting 4 =  Pain prevents me from sitting more than 10 minutes.  6. Standing 2 =  Pain prevents me from standing more than 1 hour  7. Sleeping 2 =  Even when I take pain medication, I sleep less than 6 hours  8. Social Life 1 =  My social life is normal, but it increases my level of pain.  9. Traveling 1 =  I can travel anywhere, but it increases my pain.  10. Employment/ Homemaking 1 = My normal homemaking/job activities increase my pain, but I can still perform all that is required of me  Total 13/50    Interpretation of scores: Score Category Description  0-20% Minimal Disability The patient can cope with most living activities. Usually no treatment is indicated apart from advice on lifting, sitting and exercise  21-40% Moderate Disability The patient experiences more pain and difficulty with sitting, lifting and standing. Travel and social life are more difficult and they may be disabled from work. Personal care, sexual activity and sleeping are not grossly affected, and the patient can usually be managed by conservative means  41-60% Severe Disability Pain remains the main problem in this group, but activities of daily living are affected. These patients require a detailed investigation  61-80% Crippled Back pain impinges on all aspects of the patient's life. Positive intervention is required  81-100% Bed-bound  These patients are either bed-bound or exaggerating their symptoms  Bluford BRAVO, Zoe DELENA Karon DELENA, et al. Surgery versus  conservative management of stable thoracolumbar fracture: the PRESTO feasibility RCT. [place] ([place]): [employer]; 2021 Nov. [facility] Technology Assessment, No. 25.62.) Appendix 3, Oswestry Disability Index category descriptors. Available from: FindJewel[employer].cz  Minimally Clinically Important Difference (MCID) = 12.8%  COGNITION: Overall cognitive status: Within functional limits for tasks assessed     MUSCLE LENGTH: Hamstrings: Right 75 deg; Left 30 deg  LUMBAR ROM: deferred d/t high irritability at time of evaluation   AROM eval 02/09/2024   Flexion  90%  Extension  90%%  Right lateral flexion  90%  Left lateral flexion  90%  Right rotation  50%, p!  Left rotation  75%, p!   (Blank rows = not tested)  LOWER EXTREMITY ROM:     Active  Right eval Left eval  Hip flexion    Hip extension    Hip abduction    Hip adduction    Hip internal rotation    Hip external rotation    Knee flexion    Knee  extension    Ankle dorsiflexion    Ankle plantarflexion    Ankle inversion    Ankle eversion     (Blank rows = not tested)  LOWER EXTREMITY MMT:    MMT Right eval Left eval Right 02/09/2024 Left 02/09/2024   Hip flexion 4 4 4+ 4+  Hip extension      Hip abduction 4+ 4- 4+ 4+  Hip adduction      Hip internal rotation      Hip external rotation      Knee flexion      Knee extension 4+ 4+ 4+ 4+  Ankle dorsiflexion      Ankle plantarflexion      Ankle inversion      Ankle eversion       (Blank rows = not tested)  LUMBAR SPECIAL TESTS:  Straight leg raise test: Positive and Slump test: Positive   TREATMENT DATE:    [facility] Adult PT Treatment:                                                DATE: 02/09/2024  Therapeutic Activity:  Nustep level 5 x 8 min at end of session Reassessment of objective measures and subjective assessment regarding progress towards established goals and updated plan for addressing remaining deficits and rehab goals.     PATIENT EDUCATION:  Education details: reviewed initial home exercise program; discussion of POC, prognosis and goals for skilled PT   Person educated: Patient Education method: Explanation Education comprehension: verbalized understanding, returned demonstration, and needs further education  HOME EXERCISE PROGRAM: Access Code: [facility] URL: [email or web address] Date: 12/22/2023 Prepared by: Marko Molt  Exercises - Lying Prone  - 2 x daily - 7 x weekly - 2 sets - 10 reps - 3 sec hold - Static Prone on Elbows  - 2 x daily - 7 x weekly - 4 sets - 10 sec hold - Seated Thoracic Lumbar Extension with Pectoralis Stretch  - 2 x daily - 7 x weekly - 2 sets - 10 reps - 3 sec hold - Standing Lumbar Extension with Counter  - 2 x daily - 7 x weekly - 2 sets - 5 reps - 3 sec hold  ASSESSMENT:  CLINICAL IMPRESSION:    02/09/2024 Eric Hooper has attended 8 visits since initial evaluation for LBP with radicular symptoms.  Patient has made good progress with overall activity tolerance, and have met all goals. He should continue with prescribed home program. Patient will be discharged from skilled PT at this time and should follow up with referring provider as needed.       OBJECTIVE IMPAIRMENTS: decreased activity tolerance, decreased ROM, decreased strength, improper body mechanics, and pain.   ACTIVITY LIMITATIONS: carrying, lifting, bending, sitting, and squatting  PARTICIPATION LIMITATIONS: meal prep, cleaning, driving, shopping, community activity, and occupation  PERSONAL FACTORS: Past/current experiences, Profession, Time since onset of injury/illness/exacerbation, and 3+ comorbidities: Relevant PMHx includes DM type 2, Diverticulosis (2018), HTN, Renal insufficiency, Chronic L shoulder and cervical pain, TTP Syndrome, Thrombocytopenia  are also affecting patient's functional outcome.   REHAB POTENTIAL: Fair    CLINICAL DECISION MAKING: Evolving/moderate complexity  EVALUATION COMPLEXITY: Moderate   GOALS: Goals reviewed with patient? YES  SHORT TERM GOALS: Target date: 01/12/2024    Patient will be independent with initial home program at least 3 days/week.  Baseline: provided at eval Goal Status: MET   2.  Patient will demonstrate ability to tolerate at least 50% lumbar AROM in all directions Baseline: Unable to tolerate due to severity of symptoms and high irritability with LS motion Goal status: MET   LONG TERM GOALS: Target date: 02/02/2024    Patient will report improved overall functional ability with ODI score of 5/50 or less.  Baseline: 13/50 02/09/2024 9/50 Goal Status: progressing  2.  Patient will demonstrate improved LE MMT to at least 4+/5 BIL.  Baseline: see MMT chart above  Goal status:MET  3.  Patient will demonstrate ability to tolerate at least 50% lumbar AROM in all directions Baseline: Unable to tolerate due to severity of symptoms and high irritability with  LS motion Goal status: MET  4.  Patient will demonstrate ability to perform floor to waist lifting of at least 25# using appropriate body mechanics and with no more than minimal pain in order to safely perform normal daily/occupational tasks.   Baseline: unable to tolerate   Goal Status: MET  5. Patient will report ability to tolerate sitting >30 minutes without exacerbation of LQ pain.  Baseline: Unable to tolerate; patient reports severe pain immediately with seated positions Goal Status: MET   PLAN:  PT FREQUENCY: 1-2x/week  PT DURATION: 6 weeks  PLANNED INTERVENTIONS: 97164- PT Re-evaluation, 97750- Physical Performance Testing, 97110-Therapeutic exercises, 97530- Therapeutic activity, [id number]- Neuromuscular re-education, 97535- Self Care, 02859- Manual therapy, [id number]- Aquatic Therapy, H9716- Electrical stimulation (unattended), 20560 (1-2 muscles), 20561 (3+ muscles)- Dry Needling, Patient/Family education, Taping, Spinal mobilization, Cryotherapy, and Moist heat.    Marko Molt, PT, DPT  02/10/2024 4:13 PM

## 2024-02-26 ENCOUNTER — Other Ambulatory Visit: Payer: Self-pay | Admitting: Family Medicine

## 2024-02-26 DIAGNOSIS — I152 Hypertension secondary to endocrine disorders: Secondary | ICD-10-CM

## 2024-02-28 ENCOUNTER — Encounter: Payer: Self-pay | Admitting: Radiology

## 2024-03-01 ENCOUNTER — Other Ambulatory Visit: Payer: Self-pay | Admitting: Family Medicine

## 2024-03-01 DIAGNOSIS — I152 Hypertension secondary to endocrine disorders: Secondary | ICD-10-CM

## 2024-03-08 NOTE — Progress Notes (Signed)
 Surgical Instructions   Your procedure is scheduled on Wednesday March 15, 2024. Report to [facility] Main Entrance A at 10:30 A.M., then check in with the Admitting office. Any questions or running late day of surgery: call [phone]  Questions prior to your surgery date: call [phone], Monday-Friday, 8am-4pm. If you experience any cold or flu symptoms such as cough, fever, chills, shortness of breath, etc. between now and your scheduled surgery, please notify us  at the above number.     Remember:  Do not eat after midnight the night before your surgery   You may drink clear liquids until 9:30 the morning of your surgery.   Clear liquids allowed are: Water, Non-Citrus Juices (without pulp), Carbonated Beverages, Clear Tea (no milk, honey, etc.), Black Coffee Only (NO MILK, CREAM OR POWDERED CREAMER of any kind), and Gatorade.    Take these medicines the morning of surgery with A SIP OF WATER  atorvastatin  (LIPITOR)   May take these medicines IF NEEDED: esomeprazole  (NEXIUM )    Follow your surgeon's instructions on when to stop Asprin.  If no instructions were given by your surgeon then you will need to call the office to get those instructions.       One week prior to surgery, STOP taking any Aleve, Naproxen, Ibuprofen, Motrin, Advil, Goody's, BC's, all herbal medications, fish oil, and non-prescription vitamins.   WHAT DO I DO ABOUT MY DIABETES MEDICATION?  Do not take oral diabetes medicines (pills) the morning of surgery.        DO NOT TAKE YOUR empagliflozin  (JARDIANCE ) 72 HOURS PRIOR TO SURGERY WITH THE LAST DOSE BEING 03/11/2024   The day of surgery, do not take other diabetes injectables, including Byetta (exenatide), Bydureon (exenatide ER), Victoza (liraglutide), or Trulicity (dulaglutide).  If your CBG is greater than 220 mg/dL, you may take  of your sliding scale (correction) dose of insulin .   HOW TO MANAGE YOUR DIABETES BEFORE AND AFTER  SURGERY  Why is it important to control my blood sugar before and after surgery? Improving blood sugar levels before and after surgery helps healing and can limit problems. A way of improving blood sugar control is eating a healthy diet by:  Eating less sugar and carbohydrates  Increasing activity/exercise  Talking with your doctor about reaching your blood sugar goals High blood sugars (greater than 180 mg/dL) can raise your risk of infections and slow your recovery, so you will need to focus on controlling your diabetes during the weeks before surgery. Make sure that the doctor who takes care of your diabetes knows about your planned surgery including the date and location.  How do I manage my blood sugar before surgery? Check your blood sugar at least 4 times a day, starting 2 days before surgery, to make sure that the level is not too high or low.  Check your blood sugar the morning of your surgery when you wake up and every 2 hours until you get to the Short Stay unit.  If your blood sugar is less than 70 mg/dL, you will need to treat for low blood sugar: Do not take insulin . Treat a low blood sugar (less than 70 mg/dL) with  cup of clear juice (cranberry or apple), 4 glucose tablets, OR glucose gel. Recheck blood sugar in 15 minutes after treatment (to make sure it is greater than 70 mg/dL). If your blood sugar is not greater than 70 mg/dL on recheck, call [phone] for further instructions. Report your blood sugar to the  short stay nurse when you get to Short Stay.  If you are admitted to the hospital after surgery: Your blood sugar will be checked by the staff and you will probably be given insulin  after surgery (instead of oral diabetes medicines) to make sure you have good blood sugar levels. The goal for blood sugar control after surgery is 80-180 mg/dL.                      Do NOT Smoke (Tobacco/Vaping) for 24 hours prior to your procedure.  If you use a CPAP at night,  you may bring your mask/headgear for your overnight stay.   You will be asked to remove any contacts, glasses, piercing's, hearing aid's, dentures/partials prior to surgery. Please bring cases for these items if needed.    Patients discharged the day of surgery will not be allowed to drive home, and someone needs to stay with them for 24 hours.  SURGICAL WAITING ROOM VISITATION Patients may have no more than 2 support people in the waiting area - these visitors may rotate.   Pre-op nurse will coordinate an appropriate time for 1 ADULT support person, who may not rotate, to accompany patient in pre-op.  Children under the age of 98 must have an adult with them who is not the patient and must remain in the main waiting area with an adult.  If the patient needs to stay at the hospital during part of their recovery, the visitor guidelines for inpatient rooms apply.  Please refer to the [facility] website for the visitor guidelines for any additional information.   If you received a COVID test during your pre-op visit  it is requested that you wear a mask when out in public, stay away from anyone that may not be feeling well and notify your surgeon if you develop symptoms. If you have been in contact with anyone that has tested positive in the last 10 days please notify you surgeon.      Pre-operative CHG Bathing Instructions   You can play a key role in reducing the risk of infection after surgery. Your skin needs to be as free of germs as possible. You can reduce the number of germs on your skin by washing with CHG (chlorhexidine  gluconate) soap before surgery. CHG is an antiseptic soap that kills germs and continues to kill germs even after washing.   DO NOT use if you have an allergy  to chlorhexidine /CHG or antibacterial soaps. If your skin becomes reddened or irritated, stop using the CHG and notify one of our RNs at [phone].              TAKE A SHOWER THE NIGHT BEFORE SURGERY    Please keep in mind the following:   You may shave your face before/day of surgery.  Place clean sheets on your bed the night before surgery Use a clean washcloth (not used since being washed) for shower. DO NOT sleep with pet's night before surgery.  CHG Shower Instructions:  Wash your face and private area with normal soap. If you choose to wash your hair, wash first with your normal shampoo.  After you use shampoo/soap, rinse your hair and body thoroughly to remove shampoo/soap residue.  Turn the water OFF and apply half the bottle of CHG soap to a CLEAN washcloth.  Apply CHG soap ONLY FROM YOUR NECK DOWN TO YOUR TOES (washing for 3-5 minutes)  DO NOT use CHG soap on face, private areas,  open wounds, or sores.  Pay special attention to the area where your surgery is being performed.  If you are having back surgery, having someone wash your back for you may be helpful. Wait 2 minutes after CHG soap is applied, then you may rinse off the CHG soap.  Pat dry with a clean towel  Put on clean pajamas    Additional instructions for the day of surgery: If you choose, you may shower the morning of surgery with an antibacterial soap.  DO NOT APPLY any lotions, deodorants or cologne.   Do not wear jewelry Do not bring valuables to the hospital. [facility] is not responsible for valuables/personal belongings. Put on clean/comfortable clothes.  Please brush your teeth.  Ask your nurse before applying any prescription medications to the skin.

## 2024-03-09 ENCOUNTER — Encounter (HOSPITAL_COMMUNITY)
Admission: RE | Admit: 2024-03-09 | Discharge: 2024-03-09 | Disposition: A | Discharge status: Home / Self Care | Source: Ambulatory Visit | Attending: Otolaryngology | Admitting: Otolaryngology

## 2024-03-09 ENCOUNTER — Encounter (HOSPITAL_COMMUNITY): Payer: Self-pay | Admitting: Physician Assistant

## 2024-03-09 ENCOUNTER — Encounter (HOSPITAL_COMMUNITY): Payer: Self-pay

## 2024-03-09 ENCOUNTER — Other Ambulatory Visit: Payer: Self-pay

## 2024-03-09 VITALS — BP 142/88 | HR 39 | Temp 97.8°F | Resp 17 | Ht 71.0 in | Wt 176.0 lb

## 2024-03-09 DIAGNOSIS — Z01818 Encounter for other preprocedural examination: Secondary | ICD-10-CM | POA: Insufficient documentation

## 2024-03-09 HISTORY — DX: Malignant (primary) neoplasm, unspecified: C80.1

## 2024-03-09 LAB — CBC
HCT: 49.6 % (ref 39.0–52.0)
Hemoglobin: 17.2 g/dL — ABNORMAL HIGH (ref 13.0–17.0)
MCH: 31.4 pg (ref 26.0–34.0)
MCHC: 34.7 g/dL (ref 30.0–36.0)
MCV: 90.7 fL (ref 80.0–100.0)
Platelets: 167 K/uL (ref 150–400)
RBC: 5.47 MIL/uL (ref 4.22–5.81)
RDW: 13.2 % (ref 11.5–15.5)
WBC: 9.3 K/uL (ref 4.0–10.5)
nRBC: 0 % (ref 0.0–0.2)

## 2024-03-09 LAB — HEMOGLOBIN A1C
Hgb A1c MFr Bld: 5 % (ref 4.8–5.6)
Mean Plasma Glucose: 96.8 mg/dL

## 2024-03-09 LAB — COMPREHENSIVE METABOLIC PANEL WITH GFR
ALT: 35 U/L (ref 0–44)
AST: 23 U/L (ref 15–41)
Albumin: 4.2 g/dL (ref 3.5–5.0)
Alkaline Phosphatase: 121 U/L (ref 38–126)
Anion gap: 10 (ref 5–15)
BUN: 15 mg/dL (ref 6–20)
CO2: 26 mmol/L (ref 22–32)
Calcium: 9.2 mg/dL (ref 8.9–10.3)
Chloride: 105 mmol/L (ref 98–111)
Creatinine, Ser: 0.95 mg/dL (ref 0.61–1.24)
GFR, Estimated: >60 mL/min (ref >60)
Glucose, Bld: 91 mg/dL (ref 70–99)
Potassium: 3.7 mmol/L (ref 3.5–5.1)
Sodium: 141 mmol/L (ref 135–145)
Total Bilirubin: 2.3 mg/dL — ABNORMAL HIGH (ref 0.0–1.2)
Total Protein: 7 g/dL (ref 6.5–8.1)

## 2024-03-09 LAB — GLUCOSE, CAPILLARY: Glucose-Capillary: 90 mg/dL (ref 70–99)

## 2024-03-09 NOTE — Progress Notes (Signed)
 Surgical Instructions     Your procedure is scheduled on Wednesday March 15, 2024. Report to [facility] Main Entrance A at 10:30 A.M., then check in with the Admitting office. Any questions or running late day of surgery: call [phone]   Questions prior to your surgery date: call [phone], Monday-Friday, 8am-4pm. If you experience any cold or flu symptoms such as cough, fever, chills, shortness of breath, etc. between now and your scheduled surgery, please notify us  at the above number.            Remember:       Do not eat after midnight the night before your surgery     You may drink clear liquids until 9:30 the morning of your surgery.   Clear liquids allowed are: Water, Non-Citrus Juices (without pulp), Carbonated Beverages, Clear Tea (no milk, honey, etc.), Black Coffee Only (NO MILK, CREAM OR POWDERED CREAMER of any kind), and Gatorade.          Take these medicines the morning of surgery with A SIP OF WATER  atorvastatin  (LIPITOR)  Amlodipine  (Norvasc )   May take these medicines IF NEEDED: esomeprazole  (NEXIUM )      Follow your surgeon's instructions on when to stop Asprin.  If no instructions were given by your surgeon then you will need to call the office to get those instructions.           One week prior to surgery, STOP taking any Aleve, Naproxen, Ibuprofen, Motrin, Advil, Goody's, BC's, all herbal medications, fish oil, and non-prescription vitamins.     WHAT DO I DO ABOUT MY DIABETES MEDICATION?   Do not take oral diabetes medicines (pills) the morning of surgery.         DO NOT TAKE YOUR empagliflozin  (JARDIANCE ) 72 HOURS PRIOR TO SURGERY WITH THE LAST DOSE BEING 03/11/2024         HOW TO MANAGE YOUR DIABETES BEFORE AND AFTER SURGERY   Why is it important to control my blood sugar before and after surgery? Improving blood sugar levels before and after surgery helps healing and can limit problems. A way of improving blood sugar control is  eating a healthy diet by:  Eating less sugar and carbohydrates  Increasing activity/exercise  Talking with your doctor about reaching your blood sugar goals High blood sugars (greater than 180 mg/dL) can raise your risk of infections and slow your recovery, so you will need to focus on controlling your diabetes during the weeks before surgery. Make sure that the doctor who takes care of your diabetes knows about your planned surgery including the date and location.   How do I manage my blood sugar before surgery? Check your blood sugar at least 4 times a day, starting 2 days before surgery, to make sure that the level is not too high or low.   Check your blood sugar the morning of your surgery when you wake up and every 2 hours until you get to the Short Stay unit.   If your blood sugar is less than 70 mg/dL, you will need to treat for low blood sugar: Do not take insulin . Treat a low blood sugar (less than 70 mg/dL) with  cup of clear juice (cranberry or apple), 4 glucose tablets, OR glucose gel. Recheck blood sugar in 15 minutes after treatment (to make sure it is greater than 70 mg/dL). If your blood sugar is not greater than 70 mg/dL on recheck, call [phone] for further instructions. Report your blood sugar  to the short stay nurse when you get to Short Stay.   If you are admitted to the hospital after surgery: Your blood sugar will be checked by the staff and you will probably be given insulin  after surgery (instead of oral diabetes medicines) to make sure you have good blood sugar levels. The goal for blood sugar control after surgery is 80-180 mg/dL.                       Do NOT Smoke (Tobacco/Vaping) for 24 hours prior to your procedure.   If you use a CPAP at night, you may bring your mask/headgear for your overnight stay.   You will be asked to remove any contacts, glasses, piercing's, hearing aid's, dentures/partials prior to surgery. Please bring cases for these items if  needed.    Patients discharged the day of surgery will not be allowed to drive home, and someone needs to stay with them for 24 hours.   SURGICAL WAITING ROOM VISITATION Patients may have no more than 2 support people in the waiting area - these visitors may rotate.   Pre-op nurse will coordinate an appropriate time for 1 ADULT support person, who may not rotate, to accompany patient in pre-op.  Children under the age of 47 must have an adult with them who is not the patient and must remain in the main waiting area with an adult.   If the patient needs to stay at the hospital during part of their recovery, the visitor guidelines for inpatient rooms apply.   Please refer to the [facility] website for the visitor guidelines for any additional information.     If you received a COVID test during your pre-op visit  it is requested that you wear a mask when out in public, stay away from anyone that may not be feeling well and notify your surgeon if you develop symptoms. If you have been in contact with anyone that has tested positive in the last 10 days please notify you surgeon.         Pre-operative CHG Bathing Instructions    You can play a key role in reducing the risk of infection after surgery. Your skin needs to be as free of germs as possible. You can reduce the number of germs on your skin by washing with CHG (chlorhexidine  gluconate) soap before surgery. CHG is an antiseptic soap that kills germs and continues to kill germs even after washing.    DO NOT use if you have an allergy  to chlorhexidine /CHG or antibacterial soaps. If your skin becomes reddened or irritated, stop using the CHG and notify one of our RNs at [phone].               TAKE A SHOWER THE NIGHT BEFORE SURGERY    Please keep in mind the following:   You may shave your face before/day of surgery.  Place clean sheets on your bed the night before surgery Use a clean washcloth (not used since being washed) for  shower. DO NOT sleep with pet's night before surgery.   CHG Shower Instructions:  Wash your face and private area with normal soap. If you choose to wash your hair, wash first with your normal shampoo.  After you use shampoo/soap, rinse your hair and body thoroughly to remove shampoo/soap residue.  Turn the water OFF and apply half the bottle of CHG soap to a CLEAN washcloth.  Apply CHG soap ONLY FROM YOUR NECK  DOWN TO YOUR TOES (washing for 3-5 minutes)  DO NOT use CHG soap on face, private areas, open wounds, or sores.  Pay special attention to the area where your surgery is being performed.  If you are having back surgery, having someone wash your back for you may be helpful. Wait 2 minutes after CHG soap is applied, then you may rinse off the CHG soap.  Pat dry with a clean towel  Put on clean pajamas     Additional instructions for the day of surgery: If you choose, you may shower the morning of surgery with an antibacterial soap.  DO NOT APPLY any lotions, deodorants or cologne.   Do not wear jewelry Do not bring valuables to the hospital. [facility] is not responsible for valuables/personal belongings. Put on clean/comfortable clothes.  Please brush your teeth.  Ask your nurse before applying any prescription medications to the skin.

## 2024-03-09 NOTE — Progress Notes (Signed)
 PCP - [facility] Cardiologist - Ronal Ross - LOV 10/2022 - patient going to call to be seen - pt is aware that Ronal Ross is no longer with [facility] and will need to see someone new Oncologist - Dr. Arley Hof - clearance found in media  PPM/ICD - denies Device Orders -  n/a Rep Notified -  n/a  Chest x-ray - 06/14/23 EKG - 03/09/24 Stress Test - denies ECHO - 11/26/2022 Cardiac Cath - denies  Sleep Study - denies CPAP -  n/a  Fasting Blood Sugar - 70-90 Checks Blood Sugar __3__ times a day  Last dose of GLP1 agonist-  n/a GLP1 instructions:  n/a  Patient instructed to stop Jardiance  3 days prior to surgery.    Blood Thinner Instructions: n/a Aspirin  Instructions: patient instructed to call Dr. Inez office for Aspirin  instructions  ERAS Protcol -clears until 0930 PRE-SURGERY Ensure or G2- n/a  COVID TEST-  n/a   Anesthesia review: yes - TTP, cardiac clearance  Patient denies shortness of breath, fever, cough and chest pain at PAT appointment   All instructions explained to the patient, with a verbal understanding of the material. Patient agrees to go over the instructions while at home for a better understanding. Patient also instructed to self quarantine after being tested for COVID-19. The opportunity to ask questions was provided.   Patient presented to PAT with elevated blood pressure and complaining of chest discomfort, and monitor showed HR in the 30s.  EKG obtained.  HR 75.   Patient denies shortness of breath or dizziness and says he doesn't have chest pain but he can just feel something.  He informed nurses that he has not been taking his reflux medications and that he does have difficult swallowing at times.  Notified Lynwood and Isaiah.  Patient instructed to contact cardiology office today for an appointment and cardiac clearance.  Patient educated to seek immediate care of symptoms worsen.

## 2024-03-10 ENCOUNTER — Ambulatory Visit

## 2024-03-10 ENCOUNTER — Telehealth: Payer: Self-pay

## 2024-03-10 ENCOUNTER — Ambulatory Visit
Admission: RE | Admit: 2024-03-10 | Discharge: 2024-03-10 | Disposition: A | Discharge status: Home / Self Care | Source: Ambulatory Visit | Attending: Cardiology | Admitting: Cardiology

## 2024-03-10 ENCOUNTER — Ambulatory Visit (INDEPENDENT_AMBULATORY_CARE_PROVIDER_SITE_OTHER): Admitting: Cardiology

## 2024-03-10 ENCOUNTER — Encounter: Payer: Self-pay | Admitting: Cardiology

## 2024-03-10 ENCOUNTER — Telehealth: Payer: Self-pay | Admitting: Cardiology

## 2024-03-10 VITALS — BP 158/76 | HR 76 | Ht 69.0 in | Wt 173.0 lb

## 2024-03-10 DIAGNOSIS — I493 Ventricular premature depolarization: Secondary | ICD-10-CM

## 2024-03-10 DIAGNOSIS — E119 Type 2 diabetes mellitus without complications: Secondary | ICD-10-CM | POA: Insufficient documentation

## 2024-03-10 DIAGNOSIS — R0789 Other chest pain: Secondary | ICD-10-CM

## 2024-03-10 DIAGNOSIS — Z79899 Other long term (current) drug therapy: Secondary | ICD-10-CM | POA: Diagnosis not present

## 2024-03-10 DIAGNOSIS — E782 Mixed hyperlipidemia: Secondary | ICD-10-CM | POA: Insufficient documentation

## 2024-03-10 DIAGNOSIS — Z794 Long term (current) use of insulin: Secondary | ICD-10-CM | POA: Diagnosis not present

## 2024-03-10 DIAGNOSIS — I2510 Atherosclerotic heart disease of native coronary artery without angina pectoris: Secondary | ICD-10-CM | POA: Diagnosis not present

## 2024-03-10 DIAGNOSIS — R17 Unspecified jaundice: Secondary | ICD-10-CM

## 2024-03-10 DIAGNOSIS — Z01810 Encounter for preprocedural cardiovascular examination: Secondary | ICD-10-CM

## 2024-03-10 LAB — ECHOCARDIOGRAM COMPLETE
Calc EF: 48.6 %
Height: 69 in
S' Lateral: 3.6 cm
Single Plane A2C EF: 53.5 %
Single Plane A4C EF: 47.1 %
Weight: 2768 [oz_av]

## 2024-03-10 MED ORDER — CARVEDILOL 6.25 MG PO TABS: 6.2500 mg | ORAL_TABLET | Freq: Two times a day (BID) | ORAL | 3 refills | Status: DC

## 2024-03-10 NOTE — Telephone Encounter (Signed)
  Pt c/o medication issue:  1. Name of Medication:   carvedilol (COREG) 6.25 MG tablet    2. How are you currently taking this medication (dosage and times per day)?   Take 1 tablet (6.25 mg total) by mouth 2 (two) times daily.    3. Are you having a reaction (difficulty breathing--STAT)? No   4. What is your medication issue? The patient stated that when he picked up his new prescription, he read the list of side effects and now has some questions about them.

## 2024-03-10 NOTE — Telephone Encounter (Signed)
 Spoke with Pt. Pt was concerned that coreg would drop his glucose level too low after reading the warnings. Pt has not started medication. Asked pt if he would like me to send a message to Dr Sheena to see about a medication change or if he would like to try the medication and see if that would be an issue for him. Asked if he had a way to monitor his glucose, and he does. Pt is going to try medication over the weekend and see how he feels. He will contact us  if he has issues. Advised I would send this over to Dr Sheena as RICK. Pt states understanding.

## 2024-03-10 NOTE — Patient Instructions (Addendum)
 Medication Instructions:  Your physician has recommended you make the following change in your medication:  START: Coreg 6.25 mg twice daily  *If you need a refill on your cardiac medications before your next appointment, please call your pharmacy*  Lab Work: CMET, Mag If you have labs (blood work) drawn today and your tests are completely normal, you will receive your results only by: MyChart Message (if you have MyChart) OR A paper copy in the mail If you have any lab test that is abnormal or we need to change your treatment, we will call you to review the results.  Testing/Procedures: Your physician has requested that you have an echocardiogram - urgent. Echocardiography is a painless test that uses sound waves to create images of your heart. It provides your doctor with information about the size and shape of your heart and how well your heart's chambers and valves are working. This procedure takes approximately one hour. There are no restrictions for this procedure. Please do NOT wear cologne, perfume, aftershave, or lotions (deodorant is allowed). Please arrive 15 minutes prior to your appointment time.  Please note: We ask at that you not bring children with you during ultrasound (echo/ vascular) testing. Due to room size and safety concerns, children are not allowed in the ultrasound rooms during exams. Our front office staff cannot provide observation of children in our lobby area while testing is being conducted. An adult accompanying a patient to their appointment will only be allowed in the ultrasound room at the discretion of the ultrasound technician under special circumstances. We apologize for any inconvenience.  ZIO XT- Long Term Monitor Instructions  Your physician has requested you wear a ZIO patch monitor for 7 days.  This is a single patch monitor. [employer] supplies one patch monitor per enrollment. Additional stickers are not available. Please do not apply patch if you  will be having a Nuclear Stress Test,  Echocardiogram, Cardiac CT, MRI, or Chest Xray during the period you would be wearing the  monitor. The patch cannot be worn during these tests. You cannot remove and re-apply the  ZIO XT patch monitor.  Your ZIO patch monitor will be mailed 3 day USPS to your address on file. It may take 3-5 days  to receive your monitor after you have been enrolled.  Once you have received your monitor, please review the enclosed instructions. Your monitor  has already been registered assigning a specific monitor serial # to you.  Billing and Patient Assistance Program Information  We have supplied [employer] with any of your insurance information on file for billing purposes. [employer] offers a sliding scale Patient Assistance Program for patients that do not have  insurance, or whose insurance does not completely cover the cost of the ZIO monitor.  You must apply for the Patient Assistance Program to qualify for this discounted rate.  To apply, please call [employer] at [phone], select option 4, select option 2, ask to apply for  Patient Assistance Program. Meredeth will ask your household income, and how many people  are in your household. They will quote your out-of-pocket cost based on that information.  [employer] will also be able to set up a 16-month, interest-free payment plan if needed.  Applying the monitor   Shave hair from upper left chest.  Hold abrader disc by orange tab. Rub abrader in 40 strokes over the upper left chest as  indicated in your monitor instructions.  Clean area with 4 enclosed alcohol pads. Let dry.  Apply patch as indicated in monitor instructions. Patch will be placed under collarbone on left  side of chest with arrow pointing upward.  Rub patch adhesive wings for 2 minutes. Remove white label marked 1. Remove the white  label marked 2. Rub patch adhesive wings for 2 additional minutes.  While looking in a mirror, press and release  button in center of patch. A small green light will  flash 3-4 times. This will be your only indicator that the monitor has been turned on.  Do not shower for the first 24 hours. You may shower after the first 24 hours.  Press the button if you feel a symptom. You will hear a small click. Record Date, Time and  Symptom in the Patient Logbook.  When you are ready to remove the patch, follow instructions on the last 2 pages of Patient  Logbook. Stick patch monitor onto the last page of Patient Logbook.  Place Patient Logbook in the blue and white box. Use locking tab on box and tape box closed  securely. The blue and white box has prepaid postage on it. Please place it in the mailbox as  soon as possible. Your physician should have your test results approximately 7 days after the  monitor has been mailed back to [facility].  Call [facility] Customer Care at [phone] if you have questions regarding  your ZIO XT patch monitor. Call them immediately if you see an orange light blinking on your  monitor.  If your monitor falls off in less than 4 days, contact our Monitor department at [phone].  If your monitor becomes loose or falls off after 4 days call [employer] at [phone] for  suggestions on securing your monitor  Follow-Up: At [facility], you and your health needs are our priority.  As part of our continuing mission to provide you with exceptional heart care, our providers are all part of one team.  This team includes your primary Cardiologist (physician) and Advanced Practice Providers or APPs (Physician Assistants and Nurse Practitioners) who all work together to provide you with the care you need, when you need it.  Your next appointment:   12 week(s)  Provider:   Kardie Tobb, DO

## 2024-03-10 NOTE — Telephone Encounter (Signed)
 Patient called and said that he wanted to hold off on surgery. He said that he has an irregular heart beat. Per our conversation this week, that we were waiting on cardiac clearance. I will check with our Medical Clearance Specialist and see if she has received it, and I'll reach out to on Monday to confirm.

## 2024-03-10 NOTE — Telephone Encounter (Signed)
 Called patient back to review results.  Note elevated Tbili, now 2.3, up from 1.3 only 2 months ago.  Favor red blood cell turnover in setting of TTP, reassuring that blood count same day is normal.  However, with change in Tbili, reasonable to order follow up labs.  Lab visit scheduled for Monday 11/17 at 9 AM. - Hepatic panel (w/ bili diff) - [facility] - Haptoglobin - [facility]  Patient reassuringly has follow up with Heme-Onc on 12/9, recommended bringing this up in discussion with them.  Patient reassured, he is agreeable with plan.

## 2024-03-10 NOTE — Progress Notes (Signed)
 Cardiology Office Note:    Date:  03/10/2024   ID:  Eric Hooper, DOB 26-Nov-1975, MRN [id number]  PCP:  Toma Matas, MD  Cardiologist:  Dub Huntsman, DO  Electrophysiologist:  None   Referring MD: Toma Matas, MD    I am having chest pain  History of Present Illness:    Eric Hooper is a 48 y.o. male with a hx of hypertension, diabetes type 2, hyperlipidemia, mild coronary artery disease from a coronary CTA which was done in July 2025, frequent PVCs on his EKG.  Today he comes for procedure clearance but tells me he tells me in fact he has been experiencing intermittent chest discomfort.  He mentioned that it started just as a left-sided pain but now it has progressed and is concerning for the patient.  \  Past Medical History:  Diagnosis Date   At risk for dysfunction of heart 07/11/2018   Cancer (HCC)    Chest wall pain 08/14/2018   Controlled diabetes mellitus type 2 with complications (HCC) 02/25/2018   Diabetes mellitus (HCC) 02/01/2017   Diabetes mellitus without complication (HCC)    non-insulin  dependent   Diverticulosis 02/01/2017   on CT scan abd/pelvis   Eczema    GERD (gastroesophageal reflux disease)    Hypertension    Kidney stones 02/03/2017   Leg swelling 05/12/2017   Macrocytic anemia 02/01/2017   Microscopic hematuria 02/01/2017   Mixed hyperlipidemia 02/28/2018   Phlebitis 05/12/2017   Recurrent upper respiratory infection (URI)    Renal insufficiency 02/03/2017   Smoker 04/13/2017    Past Surgical History:  Procedure Laterality Date   COLONOSCOPY     ELBOW SURGERY Right    ESOPHAGEAL MANOMETRY N/A 09/22/2023   Procedure: MANOMETRY, ESOPHAGUS;  Surgeon: Legrand Victory LITTIE DOUGLAS, MD;  Location: [facility];  Service: Gastroenterology;  Laterality: N/A;   IR FLUORO GUIDE CV LINE RIGHT  02/02/2017   IR FLUORO GUIDE CV LINE RIGHT  11/23/2021   IR FLUORO GUIDE CV LINE RIGHT  11/28/2021   IR REMOVAL TUN CV CATH W/O FL  02/26/2022   IR US   GUIDE VASC ACCESS RIGHT  02/02/2017   IR US  GUIDE VASC ACCESS RIGHT  11/23/2021   PH IMPEDANCE STUDY N/A 09/22/2023   Procedure: IMPEDANCE PH STUDY, ESOPHAGUS;  Surgeon: Legrand Victory LITTIE DOUGLAS, MD;  Location: [facility];  Service: Gastroenterology;  Laterality: N/A;   UPPER GASTROINTESTINAL ENDOSCOPY      Current Medications: Current Meds  Medication Sig   amLODipine  (NORVASC ) 10 MG tablet TAKE 1 TABLET BY MOUTH EVERYDAY AT BEDTIME   aspirin  EC 81 MG tablet Take 1 tablet (81 mg total) by mouth daily. Swallow whole.   atorvastatin  (LIPITOR) 40 MG tablet TAKE 1 TABLET BY MOUTH EVERY DAY   carvedilol (COREG) 6.25 MG tablet Take 1 tablet (6.25 mg total) by mouth 2 (two) times daily.   clindamycin  (CLEOCIN -T) 1 % lotion Apply topically daily.   empagliflozin  (JARDIANCE ) 10 MG TABS tablet Take 1 tablet (10 mg total) by mouth daily.   EPINEPHrine  0.3 mg/0.3 mL IJ SOAJ injection Inject 0.3 mg into the muscle as needed for anaphylaxis.   eplerenone  (INSPRA ) 25 MG tablet TAKE 1 TABLET BY MOUTH EVERYDAY AT BEDTIME   esomeprazole  (NEXIUM ) 40 MG capsule TAKE 1 CAPSULE (40 MG TOTAL) BY MOUTH 2 (TWO) TIMES DAILY BEFORE A MEAL. (Patient taking differently: Take 40 mg by mouth daily as needed (acid reflux).)   losartan  (COZAAR ) 100 MG tablet TAKE 1 TABLET BY MOUTH EVERYDAY  AT BEDTIME   Tazarotene  0.1 % FOAM Apply 1 Application topically every other day.   tretinoin  (RETIN-A ) 0.05 % cream Apply 1 application  topically at bedtime.     Allergies:   Bee venom and Ibuprofen   Social History   Socioeconomic History   Marital status: Single    Spouse name: Not on file   Number of children: 2   Years of education: Not on file   Highest education level: 12th grade  Occupational History   Occupation: location manager   Occupation: unemployed  Tobacco Use   Smoking status: Former    Current packs/day: 0.00    Average packs/day: 0.5 packs/day for 15.0 years (7.5 ttl pk-yrs)    Types: Cigarettes    Quit  date: 12/2023    Years since quitting: 0.2    Passive exposure: Current   Smokeless tobacco: Never  Vaping Use   Vaping status: Never Used  Substance and Sexual Activity   Alcohol use: Not Currently   Drug use: No   Sexual activity: Yes  Other Topics Concern   Not on file  Social History Narrative   ** Merged History Encounter **       Social Drivers of Health   Financial Resource Strain: Low Risk  (12/13/2023)   Received from [facility]   Overall Financial Resource Strain (CARDIA)    How hard is it for you to pay for the very basics like food, housing, medical care, and heating?: Not very hard  Food Insecurity: Food Insecurity Present (12/13/2023)   Received from [facility]   Hunger Vital Sign    Within the past 12 months, you worried that your food would run out before you got the money to buy more.: Sometimes true    Within the past 12 months, the food you bought just didn't last and you didn't have money to get more.: Sometimes true  Transportation Needs: No Transportation Needs (12/13/2023)   Received from [facility] - Transportation    In the past 12 months, has lack of transportation kept you from medical appointments or from getting medications?: No    In the past 12 months, has lack of transportation kept you from meetings, work, or from getting things needed for daily living?: No  Physical Activity: Insufficiently Active (12/13/2023)   Received from [facility]   Exercise Vital Sign    On average, how many days per week do you engage in moderate to strenuous exercise (like a brisk walk)?: 2 days    On average, how many minutes do you engage in exercise at this level?: 30 min  Stress: Stress Concern Present (12/13/2023)   Received from [facility] - Occupational Stress Questionnaire    Do you feel stress - tense, restless, nervous, or anxious, or unable to sleep at night because your mind is troubled all the  time - these days?: Very much  Social Connections: Socially Isolated (12/13/2023)   Received from [facility]   Social Network    How would you rate your social network (family, work, friends)?: Little participation, lonely and socially isolated     Family History: The patient's family history includes Allergic rhinitis in his father and mother; Colon cancer in his maternal aunt; Diabetes type II in his father; Hyperlipidemia in his mother; Hypertension in his mother. There is no history of Esophageal cancer, Rectal cancer, or Stomach cancer.  ROS:   Review of Systems  Constitution: Negative for decreased appetite, fever and weight gain.  HENT: Negative for congestion, ear discharge, hoarse voice and sore throat.   Eyes: Negative for discharge, redness, vision loss in right eye and visual halos.  Cardiovascular: Negative for chest pain, dyspnea on exertion, leg swelling, orthopnea and palpitations.  Respiratory: Negative for cough, hemoptysis, shortness of breath and snoring.   Endocrine: Negative for heat intolerance and polyphagia.  Hematologic/Lymphatic: Negative for bleeding problem. Does not bruise/bleed easily.  Skin: Negative for flushing, nail changes, rash and suspicious lesions.  Musculoskeletal: Negative for arthritis, joint pain, muscle cramps, myalgias, neck pain and stiffness.  Gastrointestinal: Negative for abdominal pain, bowel incontinence, diarrhea and excessive appetite.  Genitourinary: Negative for decreased libido, genital sores and incomplete emptying.  Neurological: Negative for brief paralysis, focal weakness, headaches and loss of balance.  Psychiatric/Behavioral: Negative for altered mental status, depression and suicidal ideas.  Allergic/Immunologic: Negative for HIV exposure and persistent infections.    EKGs/Labs/Other Studies Reviewed:    The following studies were reviewed today:   EKG:  The ekg ordered today demonstrates   Recent Labs: 05/20/2023: B  Natriuretic Peptide 31.6 05/21/2023: Magnesium  2.1 10/01/2023: TSH 0.540 03/09/2024: ALT 35; BUN 15; Creatinine, Ser 0.95; Hemoglobin 17.2; Platelets 167; Potassium 3.7; Sodium 141  Recent Lipid Panel    Component Value Date/Time   CHOL 94 (L) 05/29/2022 1227   TRIG 53 05/29/2022 1227   HDL 42 05/29/2022 1227   CHOLHDL 2.2 05/29/2022 1227   LDLCALC 39 05/29/2022 1227    Physical Exam:    VS:  BP (!) 158/76 (BP Location: Right Arm, Patient Position: Sitting, Cuff Size: Normal)   Pulse 76   Ht 5' 9 (1.753 m)   Wt 173 lb (78.5 kg)   SpO2 98%   BMI 25.55 kg/m     Wt Readings from Last 3 Encounters:  03/10/24 173 lb (78.5 kg)  03/09/24 176 lb (79.8 kg)  01/06/24 177 lb 14.4 oz (80.7 kg)     GEN: Well nourished, well developed in no acute distress HEENT: Normal NECK: No JVD; No carotid bruits LYMPHATICS: No lymphadenopathy CARDIAC: S1S2 noted,RRR, no murmurs, rubs, gallops RESPIRATORY:  Clear to auscultation without rales, wheezing or rhonchi  ABDOMEN: Soft, non-tender, non-distended, +bowel sounds, no guarding. EXTREMITIES: No edema, No cyanosis, no clubbing MUSCULOSKELETAL:  No deformity  SKIN: Warm and dry NEUROLOGIC:  Alert and oriented x 3, non-focal PSYCHIATRIC:  Normal affect, good insight  ASSESSMENT:    1. PVC (premature ventricular contraction)   2. Medication management   3. Mild CAD   4. Mixed hyperlipidemia   5. Insulin -requiring or dependent type II diabetes mellitus (HCC)   6. Atypical chest pain   7. Preoperative cardiovascular examination    PLAN:    Chest pain -he is experiencing atypical chest pain which I suspect is in the presence of his frequent PVCs.  His EKG has changed compared to previous EKGs review from over close to a year most recently November 13 he has frequent PVCs.  I would like to place a monitor on the patient to get the objective burden of PVCs.  In the meantime we will start Coreg 6.25 mg twice daily.  I do not believe this is  anginal pain. His coronary CTA in 2024 show evidence of mild nonobstructive CAD he is on aspirin  and statin.  He is hypertensive in the office today.  But I do believe with adding the Coreg will get him close to his goal of less than  130/80 mmHg.  If not we will transition him from losartan  to olmesartan.  Again echocardiogram to reassess for any structural abnormalities.  At this time since this surgery is not an emergency I would like to make sure that we get an understanding of his EF and hope that his chest pain resolved with above treatment plan before proceeding with the surgery.  The patient is in agreement with the above plan. The patient left the office in stable condition.  The patient will follow up in   Medication Adjustments/Labs and Tests Ordered: Current medicines are reviewed at length with the patient today.  Concerns regarding medicines are outlined above.  Orders Placed This Encounter  Procedures   Comp Met (CMET)   Magnesium    LONG TERM MONITOR (3-14 DAYS)   ECHOCARDIOGRAM COMPLETE   Meds ordered this encounter  Medications   carvedilol (COREG) 6.25 MG tablet    Sig: Take 1 tablet (6.25 mg total) by mouth 2 (two) times daily.    Dispense:  180 tablet    Refill:  3    Patient Instructions  Medication Instructions:  Your physician has recommended you make the following change in your medication:  START: Coreg 6.25 mg twice daily  *If you need a refill on your cardiac medications before your next appointment, please call your pharmacy*  Lab Work: CMET, Mag If you have labs (blood work) drawn today and your tests are completely normal, you will receive your results only by: MyChart Message (if you have MyChart) OR A paper copy in the mail If you have any lab test that is abnormal or we need to change your treatment, we will call you to review the results.  Testing/Procedures: Your physician has requested that you have an echocardiogram - urgent.  Echocardiography is a painless test that uses sound waves to create images of your heart. It provides your doctor with information about the size and shape of your heart and how well your heart's chambers and valves are working. This procedure takes approximately one hour. There are no restrictions for this procedure. Please do NOT wear cologne, perfume, aftershave, or lotions (deodorant is allowed). Please arrive 15 minutes prior to your appointment time.  Please note: We ask at that you not bring children with you during ultrasound (echo/ vascular) testing. Due to room size and safety concerns, children are not allowed in the ultrasound rooms during exams. Our front office staff cannot provide observation of children in our lobby area while testing is being conducted. An adult accompanying a patient to their appointment will only be allowed in the ultrasound room at the discretion of the ultrasound technician under special circumstances. We apologize for any inconvenience.  ZIO XT- Long Term Monitor Instructions  Your physician has requested you wear a ZIO patch monitor for 7 days.  This is a single patch monitor. [employer] supplies one patch monitor per enrollment. Additional stickers are not available. Please do not apply patch if you will be having a Nuclear Stress Test,  Echocardiogram, Cardiac CT, MRI, or Chest Xray during the period you would be wearing the  monitor. The patch cannot be worn during these tests. You cannot remove and re-apply the  ZIO XT patch monitor.  Your ZIO patch monitor will be mailed 3 day USPS to your address on file. It may take 3-5 days  to receive your monitor after you have been enrolled.  Once you have received your monitor, please review the enclosed instructions. Your monitor  has  already been registered assigning a specific monitor serial # to you.  Billing and Patient Assistance Program Information  We have supplied [employer] with any of your insurance  information on file for billing purposes. [employer] offers a sliding scale Patient Assistance Program for patients that do not have  insurance, or whose insurance does not completely cover the cost of the ZIO monitor.  You must apply for the Patient Assistance Program to qualify for this discounted rate.  To apply, please call [employer] at [phone], select option 4, select option 2, ask to apply for  Patient Assistance Program. Meredeth will ask your household income, and how many people  are in your household. They will quote your out-of-pocket cost based on that information.  [employer] will also be able to set up a 30-month, interest-free payment plan if needed.  Applying the monitor   Shave hair from upper left chest.  Hold abrader disc by orange tab. Rub abrader in 40 strokes over the upper left chest as  indicated in your monitor instructions.  Clean area with 4 enclosed alcohol pads. Let dry.  Apply patch as indicated in monitor instructions. Patch will be placed under collarbone on left  side of chest with arrow pointing upward.  Rub patch adhesive wings for 2 minutes. Remove white label marked 1. Remove the white  label marked 2. Rub patch adhesive wings for 2 additional minutes.  While looking in a mirror, press and release button in center of patch. A small green light will  flash 3-4 times. This will be your only indicator that the monitor has been turned on.  Do not shower for the first 24 hours. You may shower after the first 24 hours.  Press the button if you feel a symptom. You will hear a small click. Record Date, Time and  Symptom in the Patient Logbook.  When you are ready to remove the patch, follow instructions on the last 2 pages of Patient  Logbook. Stick patch monitor onto the last page of Patient Logbook.  Place Patient Logbook in the blue and white box. Use locking tab on box and tape box closed  securely. The blue and white box has prepaid postage on it. Please  place it in the mailbox as  soon as possible. Your physician should have your test results approximately 7 days after the  monitor has been mailed back to [facility].  Call [facility] Customer Care at [phone] if you have questions regarding  your ZIO XT patch monitor. Call them immediately if you see an orange light blinking on your  monitor.  If your monitor falls off in less than 4 days, contact our Monitor department at [phone].  If your monitor becomes loose or falls off after 4 days call [employer] at [phone] for  suggestions on securing your monitor  Follow-Up: At [facility], you and your health needs are our priority.  As part of our continuing mission to provide you with exceptional heart care, our providers are all part of one team.  This team includes your primary Cardiologist (physician) and Advanced Practice Providers or APPs (Physician Assistants and Nurse Practitioners) who all work together to provide you with the care you need, when you need it.  Your next appointment:   12 week(s)  Provider:   Renatha Rosen, DO     Adopting a Healthy Lifestyle.  Know what a healthy weight is for you (roughly BMI <25) and aim to maintain this   Aim for 7+  servings of fruits and vegetables daily   65-80+ fluid ounces of water or unsweet tea for healthy kidneys   Limit to max 1 drink of alcohol per day; avoid smoking/tobacco   Limit animal fats in diet for cholesterol and heart health - choose grass fed whenever available   Avoid highly processed foods, and foods high in saturated/trans fats   Aim for low stress - take time to unwind and care for your mental health   Aim for 150 min of moderate intensity exercise weekly for heart health, and weights twice weekly for bone health   Aim for 7-9 hours of sleep daily   When it comes to diets, agreement about the perfect plan isnt easy to find, even among the experts. Experts at the [facility] of  [employer] developed an idea known as the Healthy Eating Plate. Just imagine a plate divided into logical, healthy portions.   The emphasis is on diet quality:   Load up on vegetables and fruits - one-half of your plate: Aim for color and variety, and remember that potatoes dont count.   Go for whole grains - one-quarter of your plate: Whole wheat, barley, wheat berries, quinoa, oats, brown rice, and foods made with them. If you want pasta, go with whole wheat pasta.   Protein power - one-quarter of your plate: Fish, chicken, beans, and nuts are all healthy, versatile protein sources. Limit red meat.   The diet, however, does go beyond the plate, offering a few other suggestions.   Use healthy plant oils, such as olive, canola, soy, corn, sunflower and peanut. Check the labels, and avoid partially hydrogenated oil, which have unhealthy trans fats.   If youre thirsty, drink water. Coffee and tea are good in moderation, but skip sugary drinks and limit milk and dairy products to one or two daily servings.   The type of carbohydrate in the diet is more important than the amount. Some sources of carbohydrates, such as vegetables, fruits, whole grains, and beans-are healthier than others.   Finally, stay active  Signed, Dub Huntsman, DO  03/10/2024 9:46 AM    [facility]

## 2024-03-10 NOTE — Telephone Encounter (Signed)
 Patient calls nurse line in regards to pre-op screen.   He reports he had labs drawn yesterday for an upcoming surgery. However, he reports he noticed his total bilirubin was high. He reports he noticed on mychart it has been high for years.  He reports he would like reassurance with this lab result.   Advised will forward to PCP.

## 2024-03-10 NOTE — Progress Notes (Unsigned)
 Enrolled for [employer] to mail a ZIO XT long term holter monitor to the patients address on file.

## 2024-03-11 ENCOUNTER — Encounter: Payer: Self-pay | Admitting: Oncology

## 2024-03-11 LAB — COMPREHENSIVE METABOLIC PANEL WITH GFR
ALT: 29 IU/L (ref 0–44)
AST: 18 IU/L (ref 0–40)
Albumin: 4.5 g/dL (ref 4.1–5.1)
Alkaline Phosphatase: 139 IU/L — ABNORMAL HIGH (ref 47–123)
BUN/Creatinine Ratio: 13 (ref 9–20)
BUN: 14 mg/dL (ref 6–24)
Bilirubin Total: 1.4 mg/dL — ABNORMAL HIGH (ref 0.0–1.2)
CO2: 24 mmol/L (ref 20–29)
Calcium: 9.7 mg/dL (ref 8.7–10.2)
Chloride: 103 mmol/L (ref 96–106)
Creatinine, Ser: 1.07 mg/dL (ref 0.76–1.27)
Globulin, Total: 2.2 g/dL (ref 1.5–4.5)
Glucose: 88 mg/dL (ref 70–99)
Potassium: 4.6 mmol/L (ref 3.5–5.2)
Sodium: 142 mmol/L (ref 134–144)
Total Protein: 6.7 g/dL (ref 6.0–8.5)
eGFR: 85 mL/min/1.73 (ref >59)

## 2024-03-11 LAB — MAGNESIUM: Magnesium: 2.1 mg/dL (ref 1.6–2.3)

## 2024-03-13 ENCOUNTER — Other Ambulatory Visit (INDEPENDENT_AMBULATORY_CARE_PROVIDER_SITE_OTHER)

## 2024-03-13 DIAGNOSIS — R17 Unspecified jaundice: Secondary | ICD-10-CM | POA: Diagnosis not present

## 2024-03-13 NOTE — Addendum Note (Signed)
 Addended by: Alfreida Steffenhagen L on: 03/13/2024 09:33 AM   Modules accepted: Orders

## 2024-03-13 NOTE — Telephone Encounter (Signed)
 Spoke with patient this morning. He said that his echo cardiogram abnormal and they recommended her hold his surgery and wear ZIO machine for seven days. Patient said that he had not received the equipment yet, I advised patient that I would call the office and let them know that you have concerns not having the equipment and his chief complaint was chest pains. I told patient that I would cancel his surgery 03/15/2024 and defer it until I hear from him. I advised based on the results, we will more than likely request medical clearance again.

## 2024-03-14 ENCOUNTER — Ambulatory Visit: Payer: Self-pay | Admitting: Family Medicine

## 2024-03-14 LAB — HEPATIC FUNCTION PANEL
ALT: 28 IU/L (ref 0–44)
AST: 20 IU/L (ref 0–40)
Albumin: 4.6 g/dL (ref 4.1–5.1)
Alkaline Phosphatase: 143 IU/L — ABNORMAL HIGH (ref 47–123)
Bilirubin Total: 1.3 mg/dL — ABNORMAL HIGH (ref 0.0–1.2)
Bilirubin, Direct: 0.42 mg/dL — ABNORMAL HIGH (ref 0.00–0.40)
Total Protein: 6.4 g/dL (ref 6.0–8.5)

## 2024-03-14 LAB — LACTATE DEHYDROGENASE: LDH: 142 IU/L (ref 121–224)

## 2024-03-14 LAB — HAPTOGLOBIN: Haptoglobin: 70 mg/dL (ref 23–355)

## 2024-03-14 LAB — RETICULOCYTES: Retic Ct Pct: 0.7 % (ref 0.6–2.6)

## 2024-03-15 ENCOUNTER — Ambulatory Visit (HOSPITAL_COMMUNITY): Admission: RE | Admit: 2024-03-15 | Source: Home / Self Care | Admitting: Otolaryngology

## 2024-03-15 ENCOUNTER — Encounter (HOSPITAL_COMMUNITY): Admission: RE | Payer: Self-pay | Source: Home / Self Care

## 2024-03-15 SURGERY — SINUS SURGERY, ENDOSCOPIC
Anesthesia: General | Laterality: Bilateral

## 2024-03-21 ENCOUNTER — Ambulatory Visit: Admitting: Family Medicine

## 2024-03-27 ENCOUNTER — Ambulatory Visit: Payer: Self-pay | Admitting: Cardiology

## 2024-03-27 ENCOUNTER — Other Ambulatory Visit: Payer: Self-pay | Admitting: Allergy & Immunology

## 2024-03-28 ENCOUNTER — Telehealth: Payer: Self-pay | Admitting: Cardiology

## 2024-03-28 NOTE — Telephone Encounter (Signed)
 Patient is requesting a call back to review echo results. Please advise.

## 2024-03-29 NOTE — Telephone Encounter (Signed)
 The monitor was ordered on November 14th patient received their device on November 17th, then placed it in the mail on November 26th. It was delivered to [employer] PO box 12/2 and they are currently on processing the report. Processing can take up to 4 buisness days. I did request for report to be expedited.

## 2024-03-29 NOTE — Telephone Encounter (Signed)
 Patient states he is waiting to hear back on his results from his echo and heart monitor.  Patient expressed frustration because he was supposed to have surgery on his sinuses, but procedure was cancelled. Patient states surgeon wants patient to be cleared from a cardiac standpoint to proceed with procedure and was waiting for these test results.  Heart monitor results have not been uploaded to chart yet. Patient states he mailed heart monitor back around 03/14/24.  Patient is requesting results from these two tests as soon as possible so that he may reschedule his procedure. Will forward to Dr. Sheena and monitor technicians to review.

## 2024-03-29 NOTE — Telephone Encounter (Signed)
 Pt calling again requesting c/b to review echo results. Please advise.

## 2024-03-30 MED ORDER — CARVEDILOL 12.5 MG PO TABS: 12.5000 mg | ORAL_TABLET | Freq: Every day | ORAL | 3 refills | Status: DC

## 2024-03-30 NOTE — Telephone Encounter (Signed)
 Patient is requesting a call back to get clarification on his results.

## 2024-04-01 ENCOUNTER — Encounter (HOSPITAL_COMMUNITY): Payer: Self-pay | Admitting: *Deleted

## 2024-04-01 ENCOUNTER — Other Ambulatory Visit: Payer: Self-pay

## 2024-04-01 ENCOUNTER — Ambulatory Visit (HOSPITAL_COMMUNITY)
Admission: EM | Admit: 2024-04-01 | Discharge: 2024-04-01 | Disposition: A | Discharge status: Home / Self Care | Attending: Family Medicine | Admitting: Family Medicine

## 2024-04-01 DIAGNOSIS — T50901A Poisoning by unspecified drugs, medicaments and biological substances, accidental (unintentional), initial encounter: Secondary | ICD-10-CM

## 2024-04-01 DIAGNOSIS — R209 Unspecified disturbances of skin sensation: Secondary | ICD-10-CM | POA: Diagnosis not present

## 2024-04-01 DIAGNOSIS — R079 Chest pain, unspecified: Secondary | ICD-10-CM | POA: Diagnosis not present

## 2024-04-01 LAB — COMPREHENSIVE METABOLIC PANEL WITH GFR
ALT: 35 U/L (ref 0–44)
AST: 27 U/L (ref 15–41)
Albumin: 4.1 g/dL (ref 3.5–5.0)
Alkaline Phosphatase: 113 U/L (ref 38–126)
Anion gap: 9 (ref 5–15)
BUN: 17 mg/dL (ref 6–20)
CO2: 27 mmol/L (ref 22–32)
Calcium: 9.4 mg/dL (ref 8.9–10.3)
Chloride: 105 mmol/L (ref 98–111)
Creatinine, Ser: 0.92 mg/dL (ref 0.61–1.24)
GFR, Estimated: >60 mL/min (ref >60)
Glucose, Bld: 71 mg/dL (ref 70–99)
Potassium: 4 mmol/L (ref 3.5–5.1)
Sodium: 141 mmol/L (ref 135–145)
Total Bilirubin: 1.3 mg/dL — ABNORMAL HIGH (ref 0.0–1.2)
Total Protein: 7 g/dL (ref 6.5–8.1)

## 2024-04-01 LAB — CBC
HCT: 46.9 % (ref 39.0–52.0)
Hemoglobin: 16.3 g/dL (ref 13.0–17.0)
MCH: 31.5 pg (ref 26.0–34.0)
MCHC: 34.8 g/dL (ref 30.0–36.0)
MCV: 90.7 fL (ref 80.0–100.0)
Platelets: 155 K/uL (ref 150–400)
RBC: 5.17 MIL/uL (ref 4.22–5.81)
RDW: 13.1 % (ref 11.5–15.5)
WBC: 9.5 K/uL (ref 4.0–10.5)
nRBC: 0 % (ref 0.0–0.2)

## 2024-04-01 LAB — VITAMIN D 25 HYDROXY (VIT D DEFICIENCY, FRACTURES): Vit D, 25-Hydroxy: 37.79 ng/mL (ref 30–100)

## 2024-04-01 LAB — VITAMIN B12: Vitamin B-12: 427 pg/mL (ref 180–914)

## 2024-04-01 NOTE — ED Provider Notes (Signed)
 [facility]    CSN: [id number] Arrival date & time: 04/01/24  1404      History   Chief Complaint Chief Complaint  Patient presents with   Chest Pain    HPI Eric Hooper is a 48 y.o. male.   Patient presents to clinic over concern of accidental medication overdose, chest discomfort and bradycardia.   Previous dose of carvedilol  was 6.25 mg BID Took 6.25 mg carvedilol  yesterday morning and then 12.5 mg last night before bed (wife picked up new bottle and placed on dresser, got them confused) When he woke up this morning he took 12.5 mg  Recently changed carvedilol  doses to 12.5 mg once daily  Has not had dizziness or syncope  Did have a slight pain in his chest, this has been ongoing for months, has not worsened, follows with cardiology  Feels like his left anterior thigh 'is heating up'  Has had this leg issue in the past (6 months duration), sits at home, walks around but does not exercise specifically  Left anterior thigh pain was correlated to chest discomfort today Denies weakness or trouble walking, no trauma or injuries   Out of work currently for TTP   The history is provided by the patient and medical records.  Chest Pain   Past Medical History:  Diagnosis Date   At risk for dysfunction of heart 07/11/2018   Cancer (HCC)    Chest wall pain 08/14/2018   Controlled diabetes mellitus type 2 with complications (HCC) 02/25/2018   Diabetes mellitus (HCC) 02/01/2017   Diabetes mellitus without complication (HCC)    non-insulin  dependent   Diverticulosis 02/01/2017   on CT scan abd/pelvis   Eczema    GERD (gastroesophageal reflux disease)    Hypertension    Kidney stones 02/03/2017   Leg swelling 05/12/2017   Macrocytic anemia 02/01/2017   Microscopic hematuria 02/01/2017   Mixed hyperlipidemia 02/28/2018   Phlebitis 05/12/2017   Recurrent upper respiratory infection (URI)    Renal insufficiency 02/03/2017   Smoker 04/13/2017    Patient  Active Problem List   Diagnosis Date Noted   Heartburn 12/16/2023   Atypical chest pain 12/16/2023   Foot lesion 09/03/2023   Headache 07/23/2022   Cervical disc disorder with radiculopathy of cervical region 06/20/2020   Seasonal allergic rhinitis 03/05/2020   Chronic left shoulder pain 11/27/2019   GERD (gastroesophageal reflux disease) 08/10/2019   Hypertension associated with diabetes (HCC) 08/10/2019   Insomnia 06/09/2019   PICC (peripherally inserted central catheter) in place 09/05/2018   Mixed diabetic hyperlipidemia associated with type 2 diabetes mellitus (HCC) 02/28/2018   Type 2 diabetes mellitus without complications (HCC) 02/25/2018   Tobacco use disorder 04/13/2017   T.T.P. syndrome (HCC) 02/06/2017   Thrombocytopenia 02/01/2017    Past Surgical History:  Procedure Laterality Date   COLONOSCOPY     ELBOW SURGERY Right    ESOPHAGEAL MANOMETRY N/A 09/22/2023   Procedure: MANOMETRY, ESOPHAGUS;  Surgeon: Legrand Victory LITTIE DOUGLAS, MD;  Location: [facility];  Service: Gastroenterology;  Laterality: N/A;   IR FLUORO GUIDE CV LINE RIGHT  02/02/2017   IR FLUORO GUIDE CV LINE RIGHT  11/23/2021   IR FLUORO GUIDE CV LINE RIGHT  11/28/2021   IR REMOVAL TUN CV CATH W/O FL  02/26/2022   IR US  GUIDE VASC ACCESS RIGHT  02/02/2017   IR US  GUIDE VASC ACCESS RIGHT  11/23/2021   PH IMPEDANCE STUDY N/A 09/22/2023   Procedure: IMPEDANCE PH STUDY, ESOPHAGUS;  Surgeon: Legrand,  Victory LITTIE MOULD, MD;  Location: [facility];  Service: Gastroenterology;  Laterality: N/A;   UPPER GASTROINTESTINAL ENDOSCOPY         Home Medications    Prior to Admission medications   Medication Sig Start Date End Date Taking? Authorizing Provider  amLODipine  (NORVASC ) 10 MG tablet TAKE 1 TABLET BY MOUTH EVERYDAY AT BEDTIME 11/01/23  Yes Shitarev, Dimitry, MD  aspirin  EC 81 MG tablet Take 1 tablet (81 mg total) by mouth daily. Swallow whole. 11/26/22  Yes BranchRonal BRAVO, MD  atorvastatin  (LIPITOR) 40 MG tablet TAKE 1  TABLET BY MOUTH EVERY DAY 09/14/22  Yes Lilland, Alana, DO  carvedilol  (COREG ) 12.5 MG tablet Take 1 tablet (12.5 mg total) by mouth daily. 03/30/24  Yes Tobb, Kardie, DO  clindamycin  (CLEOCIN -T) 1 % lotion Apply topically daily. 12/02/23 12/01/24 Yes Alm Delon SAILOR, DO  empagliflozin  (JARDIANCE ) 10 MG TABS tablet Take 1 tablet (10 mg total) by mouth daily. 08/06/23  Yes McDiarmid, Krystal BIRCH, MD  eplerenone  (INSPRA ) 25 MG tablet TAKE 1 TABLET BY MOUTH EVERYDAY AT BEDTIME 02/28/24  Yes Shitarev, Dimitry, MD  losartan  (COZAAR ) 100 MG tablet TAKE 1 TABLET BY MOUTH EVERYDAY AT BEDTIME 11/18/23  Yes Shitarev, Dimitry, MD  acetaminophen  (TYLENOL ) 325 MG tablet Take 2 tablets (650 mg total) by mouth every 4 (four) hours as needed (discomfort or fever). Patient not taking: Reported on 03/10/2024 11/29/21   Rosendo Norleen BROCKS, MD  blood glucose meter kit and supplies Dispense based on patient and insurance preference. Use up to four times daily as directed. (FOR ICD-10 E10.9, E11.9). Patient not taking: Reported on 03/10/2024 09/02/18   Sebastian Beverley NOVAK, MD  EPINEPHrine  0.3 mg/0.3 mL IJ SOAJ injection Inject 0.3 mg into the muscle as needed for anaphylaxis. 08/13/22   Enedelia Dorna HERO, FNP  esomeprazole  (NEXIUM ) 40 MG capsule TAKE 1 CAPSULE (40 MG TOTAL) BY MOUTH 2 (TWO) TIMES DAILY BEFORE A MEAL. Patient taking differently: Take 40 mg by mouth daily as needed (acid reflux). 07/26/23   Legrand Victory LITTIE MOULD, MD  gabapentin  (NEURONTIN ) 100 MG capsule Take 1 capsule (100 mg total) by mouth 3 (three) times daily. Patient not taking: Reported on 03/10/2024 12/22/23   Suzen Elder B, DO  lidocaine  (LIDODERM ) 5 % Place 1 patch onto the skin daily. Remove & Discard patch within 12 hours. Apply to lower back/tailbone Patient not taking: Reported on 03/10/2024 11/05/23   Romelle Booty, MD  montelukast  (SINGULAIR ) 10 MG tablet TAKE 1 TABLET BY MOUTH EVERYDAY AT BEDTIME 03/27/24   Gallagher, Joel Louis, MD  Olopatadine-Mometasone  (RYALTRIS ) 665-25 MCG/ACT SUSP Place 2 sprays into the nose 2 (two) times daily as needed. Patient not taking: Reported on 03/10/2024 08/31/23   Iva Marty Saltness, MD  polyethylene glycol powder (MIRALAX ) 17 GM/SCOOP powder Take 17 g by mouth daily as needed (constipation). Patient not taking: Reported on 03/10/2024 12/22/23   Suknaim, Kulkaew B, DO  senna (SENOKOT) 8.6 MG TABS tablet Take 1 tablet (8.6 mg total) by mouth daily. Patient not taking: Reported on 03/10/2024 12/22/23   Suzen Elder B, DO  Tazarotene  0.1 % FOAM Apply 1 Application topically every other day. 12/02/23   Alm Delon SAILOR, DO  tretinoin  (RETIN-A ) 0.05 % cream Apply 1 application  topically at bedtime.    [provider]  empagliflozin  (JARDIANCE ) 25 MG TABS tablet Take 1 tablet (25 mg total) by mouth daily. 07/15/23   Toma Matas, MD    Family History Family History  Problem  Relation Age of Onset   Allergic rhinitis Mother    Hypertension Mother    Hyperlipidemia Mother    Allergic rhinitis Father    Diabetes type II Father    Colon cancer Maternal Aunt    Esophageal cancer Neg Hx    Rectal cancer Neg Hx    Stomach cancer Neg Hx     Social History Social History   Tobacco Use   Smoking status: Former    Current packs/day: 0.00    Average packs/day: 0.5 packs/day for 15.0 years (7.5 ttl pk-yrs)    Types: Cigarettes    Quit date: 12/2023    Years since quitting: 0.2    Passive exposure: Current   Smokeless tobacco: Never  Vaping Use   Vaping status: Never Used  Substance Use Topics   Alcohol use: Not Currently   Drug use: No     Allergies   Bee venom and Ibuprofen   Review of Systems Review of Systems  Per HPI  Physical Exam Triage Vital Signs ED Triage Vitals  Encounter Vitals Group     BP 04/01/24 1423 (!) 147/97     Girls Systolic BP Percentile --      Girls Diastolic BP Percentile --      Boys Systolic BP Percentile --      Boys Diastolic BP Percentile --       Pulse Rate 04/01/24 1423 (!) 58     Resp 04/01/24 1423 20     Temp 04/01/24 1423 98.2 F (36.8 C)     Temp src --      SpO2 04/01/24 1423 98 %     Weight --      Height --      Head Circumference --      Peak Flow --      Pain Score 04/01/24 1420 4     Pain Loc --      Pain Education --      Exclude from Growth Chart --    No data found.  Updated Vital Signs BP (!) 147/97   Pulse (!) 58   Temp 98.2 F (36.8 C)   Resp 20   SpO2 98%   Visual Acuity Right Eye Distance:   Left Eye Distance:   Bilateral Distance:    Right Eye Near:   Left Eye Near:    Bilateral Near:     Physical Exam Vitals and nursing note reviewed.  Constitutional:      Appearance: He is well-developed.  Cardiovascular:     Heart sounds: Normal heart sounds. No murmur heard. Neurological:     Mental Status: He is alert.      UC Treatments / Results  Labs (all labs ordered are listed, but only abnormal results are displayed) Labs Reviewed  VITAMIN B12  VITAMIN D  25 HYDROXY (VIT D DEFICIENCY, FRACTURES)  CBC  COMPREHENSIVE METABOLIC PANEL WITH GFR    EKG   Radiology No results found.  Procedures Procedures (including critical care time)  Medications Ordered in UC Medications - No data to display  Initial Impression / Assessment and Plan / UC Course  I have reviewed the triage vital signs and the nursing notes.  Pertinent labs & imaging results that were available during my care of the patient were reviewed by me and considered in my medical decision making (see chart for details).  Vitals and triage reviewed, patient is hemodynamically stable.  Lungs vesicular, heart with regular rate and rhythm, bradycardia.  EKG  comparable to previous done 11/13 shoes no significant changes, does have less PVCs today.  Chest pain is intermittent, ongoing for the past few months.  Reassuring.  Total of 25 mg of carvedilol  in the past 24 hours discussed that this is not a toxic dose.  Patient  is bradycardic, has been this way for a few weeks per patient.  Without presyncope or dizziness, reassuring presentation.  Having ongoing changes to the left anterior thigh with sensation.  Could be related to TTP and microvascular ischemia impacting peripheral nerves.  Will check CBC, CMP, B12 and vitamin D  to rule out abnormalities.  Encouraged follow-up with oncologist to manage his TTP.  Strict emergency precautions given if chest pain changes or worsens in any way.  Patient verbalized understanding, no questions at this time.     Final Clinical Impressions(s) / UC Diagnoses   Final diagnoses:  Nonspecific chest pain  Accidental drug overdose, initial encounter  Abnormal sensation of leg     Discharge Instructions      Do not take any more carvedilol  today. Max daily dosing for hypertension is 25 mg twice daily, for a total of 50 mg in 24 hours, you are well under this amount.  Measure heart rate and blood pressure at home twice daily and record these Starting tomorrow you can take your 12.5 mg dose once daily Your heart rate was slightly low today, this is not always an abnormal finding  If you start to feel like you are going to pass out please lay flat and rest.  If you continue to feel like you are going to pass out please call 911 or seek further care. Seek immediate care for any syncope.  We are checking some basic labs today and we will contact you if urgent follow-up is needed.      ED Prescriptions   None    PDMP not reviewed this encounter.   Dreama Myron SAILOR, FNP 04/01/24 1550

## 2024-04-01 NOTE — Discharge Instructions (Addendum)
 Do not take any more carvedilol  today. Max daily dosing for hypertension is 25 mg twice daily, for a total of 50 mg in 24 hours, you are well under this amount.  Measure heart rate and blood pressure at home twice daily and record these Starting tomorrow you can take your 12.5 mg dose once daily Your heart rate was slightly low today, this is not always an abnormal finding  If you start to feel like you are going to pass out please lay flat and rest.  If you continue to feel like you are going to pass out please call 911 or seek further care. Seek immediate care for any syncope.  We are checking some basic labs today and we will contact you if urgent follow-up is needed.

## 2024-04-01 NOTE — ED Triage Notes (Addendum)
 Pt took extra doses  of Carvedilol . Pt had a change in dose of Carvedilol  . Pt normally takes carvedilol  6.5 mg twice Daily and dose change to 12.5mg . Pt reports CP,slow HR . ON Friday  at 12 midnight Pt took Carvedilol  6.5 and 12.5 .

## 2024-04-02 ENCOUNTER — Other Ambulatory Visit: Payer: Self-pay | Admitting: Oncology

## 2024-04-03 ENCOUNTER — Ambulatory Visit (HOSPITAL_COMMUNITY): Payer: Self-pay

## 2024-04-03 ENCOUNTER — Ambulatory Visit: Admitting: Dermatology

## 2024-04-04 ENCOUNTER — Inpatient Hospital Stay: Attending: Oncology

## 2024-04-04 ENCOUNTER — Inpatient Hospital Stay: Attending: Oncology | Admitting: Oncology

## 2024-04-04 VITALS — BP 136/88 | HR 74 | Temp 98.1°F | Resp 16 | Wt 176.2 lb

## 2024-04-04 VITALS — BP 140/98 | HR 60 | Temp 97.6°F | Resp 18

## 2024-04-04 DIAGNOSIS — Z5112 Encounter for antineoplastic immunotherapy: Secondary | ICD-10-CM | POA: Insufficient documentation

## 2024-04-04 DIAGNOSIS — M3119 Other thrombotic microangiopathy: Secondary | ICD-10-CM | POA: Insufficient documentation

## 2024-04-04 LAB — CBC WITH DIFFERENTIAL (CANCER CENTER ONLY)
Abs Immature Granulocytes: 0.01 K/uL (ref 0.00–0.07)
Basophils Absolute: 0 K/uL (ref 0.0–0.1)
Basophils Relative: 0 %
Eosinophils Absolute: 0.3 K/uL (ref 0.0–0.5)
Eosinophils Relative: 4 %
HCT: 43.2 % (ref 39.0–52.0)
Hemoglobin: 15.7 g/dL (ref 13.0–17.0)
Immature Granulocytes: 0 %
Lymphocytes Relative: 43 %
Lymphs Abs: 2.5 K/uL (ref 0.7–4.0)
MCH: 32.1 pg (ref 26.0–34.0)
MCHC: 36.3 g/dL — ABNORMAL HIGH (ref 30.0–36.0)
MCV: 88.3 fL (ref 80.0–100.0)
Monocytes Absolute: 0.5 K/uL (ref 0.1–1.0)
Monocytes Relative: 9 %
Neutro Abs: 2.5 K/uL (ref 1.7–7.7)
Neutrophils Relative %: 44 %
Platelet Count: 156 K/uL (ref 150–400)
RBC: 4.89 MIL/uL (ref 4.22–5.81)
RDW: 12.6 % (ref 11.5–15.5)
WBC Count: 5.9 K/uL (ref 4.0–10.5)
nRBC: 0 % (ref 0.0–0.2)

## 2024-04-04 LAB — CMP (CANCER CENTER ONLY)
ALT: 30 U/L (ref 0–44)
AST: 18 U/L (ref 15–41)
Albumin: 4.3 g/dL (ref 3.5–5.0)
Alkaline Phosphatase: 142 U/L — ABNORMAL HIGH (ref 38–126)
Anion gap: 10 (ref 5–15)
BUN: 16 mg/dL (ref 6–20)
CO2: 25 mmol/L (ref 22–32)
Calcium: 9.4 mg/dL (ref 8.9–10.3)
Chloride: 106 mmol/L (ref 98–111)
Creatinine: 0.88 mg/dL (ref 0.61–1.24)
GFR, Estimated: >60 mL/min (ref >60)
Glucose, Bld: 106 mg/dL — ABNORMAL HIGH (ref 70–99)
Potassium: 3.6 mmol/L (ref 3.5–5.1)
Sodium: 141 mmol/L (ref 135–145)
Total Bilirubin: 0.8 mg/dL (ref 0.0–1.2)
Total Protein: 6.6 g/dL (ref 6.5–8.1)

## 2024-04-04 LAB — LACTATE DEHYDROGENASE: LDH: 156 U/L (ref 105–235)

## 2024-04-04 MED ORDER — SODIUM CHLORIDE 0.9 % IV SOLN: INTRAVENOUS | Status: DC

## 2024-04-04 MED ORDER — SODIUM CHLORIDE 0.9 % IV SOLN
375.0000 mg/m2 | Freq: Once | INTRAVENOUS | Status: AC
  Administered 2024-04-04: 800 mg via INTRAVENOUS
  Filled 2024-04-04: qty 50

## 2024-04-04 MED ORDER — DIPHENHYDRAMINE HCL 25 MG PO CAPS
50.0000 mg | ORAL_CAPSULE | Freq: Once | ORAL | Status: AC
  Administered 2024-04-04: 50 mg via ORAL
  Filled 2024-04-04: qty 2

## 2024-04-04 MED ORDER — ACETAMINOPHEN 325 MG PO TABS
650.0000 mg | ORAL_TABLET | Freq: Once | ORAL | Status: AC
  Administered 2024-04-04: 650 mg via ORAL
  Filled 2024-04-04: qty 2

## 2024-04-04 NOTE — Progress Notes (Signed)
 Patient seen by Dr. Arley Hof today  Vitals are within treatment parameters:Yes   Labs are within treatment parameters: CBC within parameters, CMP pending   Treatment plan has been signed: Yes   Per physician team, Patient is ready for treatment and there are NO modifications to the treatment plan.

## 2024-04-04 NOTE — Patient Instructions (Signed)
 [facility] - A DEPT OF [facility]  Discharge Instructions: Thank you for choosing [facility] to provide your oncology and hematology care.   If you have a lab appointment with the Cancer Center, please go directly to the Cancer Center and check in at the registration area.   Wear comfortable clothing and clothing appropriate for easy access to any Portacath or PICC line.   We strive to give you quality time with your provider. You may need to reschedule your appointment if you arrive late (15 or more minutes).  Arriving late affects you and other patients whose appointments are after yours.  Also, if you miss three or more appointments without notifying the office, you may be dismissed from the clinic at the provider's discretion.      For prescription refill requests, have your pharmacy contact our office and allow 72 hours for refills to be completed.    Today you received the following chemotherapy and/or immunotherapy agents rituximab       To help prevent nausea and vomiting after your treatment, we encourage you to take your nausea medication as directed.  BELOW ARE SYMPTOMS THAT SHOULD BE REPORTED IMMEDIATELY: *FEVER GREATER THAN 100.4 F (38 C) OR HIGHER *CHILLS OR SWEATING *NAUSEA AND VOMITING THAT IS NOT CONTROLLED WITH YOUR NAUSEA MEDICATION *UNUSUAL SHORTNESS OF BREATH *UNUSUAL BRUISING OR BLEEDING *URINARY PROBLEMS (pain or burning when urinating, or frequent urination) *BOWEL PROBLEMS (unusual diarrhea, constipation, pain near the anus) TENDERNESS IN MOUTH AND THROAT WITH OR WITHOUT PRESENCE OF ULCERS (sore throat, sores in mouth, or a toothache) UNUSUAL RASH, SWELLING OR PAIN  UNUSUAL VAGINAL DISCHARGE OR ITCHING   Items with * indicate a potential emergency and should be followed up as soon as possible or go to the Emergency Department if any problems should occur.  Please show the CHEMOTHERAPY ALERT CARD or IMMUNOTHERAPY  ALERT CARD at check-in to the Emergency Department and triage nurse.  Should you have questions after your visit or need to cancel or reschedule your appointment, please contact [facility] - A DEPT OF [facility]  Dept: [phone]  and follow the prompts.  Office hours are 8:00 a.m. to 4:30 p.m. Monday - Friday. Please note that voicemails left after 4:00 p.m. may not be returned until the following business day.  We are closed weekends and major holidays. You have access to a nurse at all times for urgent questions. Please call the main number to the clinic Dept: [phone] and follow the prompts.   For any non-urgent questions, you may also contact your provider using MyChart. We now offer e-Visits for anyone 61 and older to request care online for non-urgent symptoms. For details visit [email or web address].   Also download the MyChart app! Go to the app store, search MyChart, open the app, select [facility], and log in with your MyChart username and password.

## 2024-04-04 NOTE — Progress Notes (Signed)
 [facility] OFFICE PROGRESS NOTE   Diagnosis: TTP  INTERVAL HISTORY:   Mr. Thrall completed a another treatment with rituximab  on 01/06/2024.  He reports tolerating rituximab  well.  No rash or fever.  He has mild nosebleeding.  No other bleeding.  He is undergoing evaluation for sinus surgery.  He has seen cardiology for chest pain.  He underwent a cardiac monitor and echocardiogram.  He has a mild rash over the chest.  He reports the diabetes is under good control.  Objective:  Vital signs in last 24 hours:  Blood pressure 136/88, pulse 74, temperature 98.1 F (36.7 C), temperature source Temporal, resp. rate 16, weight 176 lb 3.2 oz (79.9 kg), SpO2 100%.     Lymphatics: No cervical or supraclavicular nodes Resp: Lungs clear bilaterally Cardio: Regular rate and rhythm GI: No hepatosplenomegaly Vascular: No leg edema  Skin: Mild hyperpigmented rash over the back and chest, pustular lesions at the anterior chest   Lab Results:  Lab Results  Component Value Date   WBC 5.9 04/04/2024   HGB 15.7 04/04/2024   HCT 43.2 04/04/2024   MCV 88.3 04/04/2024   PLT 156 04/04/2024   NEUTROABS 2.5 04/04/2024    CMP  Lab Results  Component Value Date   NA 141 04/01/2024   K 4.0 04/01/2024   CL 105 04/01/2024   CO2 27 04/01/2024   GLUCOSE 71 04/01/2024   BUN 17 04/01/2024   CREATININE 0.92 04/01/2024   CALCIUM  9.4 04/01/2024   PROT 7.0 04/01/2024   ALBUMIN 4.1 04/01/2024   AST 27 04/01/2024   ALT 35 04/01/2024   ALKPHOS 113 04/01/2024   BILITOT 1.3 (H) 04/01/2024   GFRNONAA >60 04/01/2024   GFRAA 125 06/14/2020    No results found for: CEA1, CEA, CAN199, CA125  Lab Results  Component Value Date   INR 1.1 08/15/2018   LABPROT 14.4 08/15/2018    Imaging:  No results found.  Medications: I have reviewed the patient's current medications.   Assessment/Plan: TTP initially diagnosed in October 2018 with recurrence in April 2020 and July  2023. Initiation of daily plasma exchange and Solu-Medrol  02/02/2017 [name] Antibody- 72, activity less than 2% on initial presentation; [name] from 08/15/2018 <2.0. Last plasma exchange 02/06/2017 Prednisone  60 mg daily beginning 02/08/2017 Prednisone  40 mg daily beginning 02/17/2017 Prednisone  30 mg daily beginning 03/09/2017 Prednisone  20 mg daily beginning 03/23/2017 Prednisone  10 mg daily beginning 04/01/2017 Prednisone  5 mg daily for 5 days then 5 mg every other day for 5 doses then stop beginning 05/11/2017 Recurrent TTP 08/14/2018 Plasma exchange/steroids started on 08/15/2018.  Plasma exchange discontinued after treatment on 08/19/2019, discharged on prednisone  60mg  daily Prednisone  taper to 40 mg daily 08/22/2018 Weekly rituximab  for 4 doses beginning 08/25/2018 Plasma exchange resumed 08/26/2018, 08/27/2018, 08/28/2018, 08/29/2018, 08/31/2018, 09/02/2018 Prednisone  taper to 30 mg daily 09/01/2018 Cycle 3 weekly Rituxan  09/08/2018 Prednisone  taper to 20 mg daily 09/08/2018 Cycle 4 weekly Rituxan  09/15/2018 Prednisone  taper to 10 mg daily 09/20/2018 Prednisone  taper to 5 mg daily 09/27/2018 Prednisone  discontinued 10/07/2018 Admission with relapse of TTP 10/23/2021 Daily Plasma exchange initiated 11/23/2021 prednisone  8/1 Prednisone  tapered to 40 mg daily beginning 12/04/2021 Weekly Rituxan  for 4 doses beginning 12/04/2021 Discontinue plasmapheresis after appointment 12/05/2021 Admitted to [facility] 12/11/2021-plasmapheresis reinitiated, Cablivi started 12/12/2021, prednisone  dose currently 80 mg daily Every other day plasma exchange beginning 12/28/2021-completed 01/09/2022 Second weekly Rituxan  12/31/2021 Prednisone  taper to 60 mg daily beginning 12/31/2021 Third weekly Rituxan  01/06/2022 Prednisone  taper to 40 mg daily beginning  01/07/2022 Fourth weekly rituximab  01/13/2022 Prednisone  taper to 30 mg daily 01/13/2022 Prednisone  taper to 20 mg daily 01/20/2022 Prednisone  taper to 15 mg daily 02/04/2022 Per patient  report 02/19/2022 he has continued prednisone  30 mg daily and did not make the above adjustments, he will decrease prednisone  to 20 mg daily today Prednisone  taper to 10 mg daily 03/09/2022 Prednisone  taper to 5 mg daily 03/26/2022 Prednisone  taper to 5 mg every other day for 5 doses beginning 04/16/2022 ADAMTS 13 :14.2 on 04/02/2023, 7.2 on 04/08/2023 04/22/2023-rituximab  [name]: 54.6 on 05/17/2023 [name] 06/18/2023-8.9%, antibody-2 06/24/2023-rituximab  [name] 64 on 07/22/2023 07/22/2023-rituximab  08/19/2023-rituximab  08/19/2023: [name]-89.8% 09/16/2023-rituximab  09/16/2023 [name]-86.9% 10/14/2023-rituximab  01/06/2024-rituximab  01/06/2024 [name]->100% 04/04/2024-rituximab  2. Xiphoid pain-etiology unclear 3. Hypertension 4. Diabetes 5. Alcohol/Tobacco use 6. History of renal insufficiency 7. Right IJ dialysis catheter placed 02/02/2017, removed New right IJ dialysis catheter 08/15/2018 Catheter removed 09/12/2018 New right IJ dialysis catheter placed 11/23/2021 Temporary hemodialysis catheter converted to tunneled hemodialysis catheter 11/28/2021     Disposition: Mr. Shepherd remains in clinical remission from TTP.  He is tolerating the rituximab  well.  He has been maintained on rituximab  maintenance for the past year.  We will follow-up on the [name] level from today.  He will complete another treatment with rituximab  today.  He will continue follow-up with cardiology for the PVCs, chest pain, and decreased ejection fraction.  He is planning to have sinus surgery.  Mr. Myrie will return for an office visit and rituximab  in 3 months.  Arley Hof, MD  04/04/2024  8:09 AM

## 2024-04-06 ENCOUNTER — Other Ambulatory Visit

## 2024-04-06 ENCOUNTER — Ambulatory Visit

## 2024-04-06 ENCOUNTER — Ambulatory Visit: Admitting: Oncology

## 2024-04-06 ENCOUNTER — Ambulatory Visit: Payer: Self-pay | Admitting: Oncology

## 2024-04-06 LAB — ADAMTS13 ACTIVITY REFLEX

## 2024-04-06 LAB — ADAMTS13 ACTIVITY: Adamts 13 Activity: >100 % (ref >66.8)

## 2024-04-07 ENCOUNTER — Other Ambulatory Visit: Payer: Self-pay

## 2024-04-07 NOTE — Telephone Encounter (Signed)
 Patient calling for heart monitor results. He is frustrated the results have not been reviewed and discussed with him yet as he is waiting for these results and clearance from Dr. Sheena to proceed with needed surgery.  Will forward to Dr. Sheena to review.

## 2024-04-07 NOTE — Telephone Encounter (Signed)
 Patient called to follow-up on his heart monitor test results.

## 2024-04-10 ENCOUNTER — Encounter: Payer: Self-pay | Admitting: Oncology

## 2024-04-10 NOTE — Telephone Encounter (Signed)
-----   Message from Arley Hof, MD sent at 04/06/2024  5:55 PM EST ----- Please call patient, the [name] activity is normal, f/u as scheduled

## 2024-04-10 NOTE — Telephone Encounter (Signed)
 Patient gave verbal understanding and had no further question

## 2024-04-14 ENCOUNTER — Other Ambulatory Visit: Payer: Self-pay

## 2024-04-14 ENCOUNTER — Encounter: Payer: Self-pay | Admitting: *Deleted

## 2024-04-14 NOTE — Progress Notes (Signed)
 Faxed completed hematology clearance to [facility], Dr. Burke for procedure 04/28/2024

## 2024-04-18 ENCOUNTER — Ambulatory Visit (INDEPENDENT_AMBULATORY_CARE_PROVIDER_SITE_OTHER): Admitting: Family Medicine

## 2024-04-18 ENCOUNTER — Encounter: Payer: Self-pay | Admitting: Family Medicine

## 2024-04-18 ENCOUNTER — Telehealth: Payer: Self-pay

## 2024-04-18 VITALS — BP 120/82 | HR 60 | Wt 175.8 lb

## 2024-04-18 DIAGNOSIS — I493 Ventricular premature depolarization: Secondary | ICD-10-CM

## 2024-04-18 DIAGNOSIS — K5900 Constipation, unspecified: Secondary | ICD-10-CM

## 2024-04-18 NOTE — Patient Instructions (Addendum)
 Follow this bowel regimen every day for the next 3-5 days until you are having consistent soft, painless bowel movements that require minimal pushing. - Gatorade 20oz bottle mixed with 2 capfuls (34ml) of miralax  twice a day. - Senna 2 tablets twice a day - You can wait until after Christmas to start this bowel regimen.   Once your bowel movements improve, you can decrease the dose. A good daily bowel regimen for chronic constipation: - Fiber supplement 1 serving with each meal. You can get gummies, capsules, or drink powder. Make sure you drink them with a full glass of water for them to work properly.   Please seek further medical attention if you:  - are vomiting uncontrollably - are unable to drink enough to stay hydrated  - have uncontrolled abdominal pain - have blood in your stool - have fevers of 100.8F or higher for 3 days in a row   Come back in 1 month if your symptoms are not improving.

## 2024-04-18 NOTE — Telephone Encounter (Signed)
 Patient calls nurse line regarding concerns with constipation.   He has been taking Miralax  17g daily. Does not feel that this is helping. He has also been taking fiber pill. States that he has not been taking Senna.   Last normal BM was 1.5 weeks ago. He is asking for a stronger medication. Reports that he has had very small hard pellet like stool about 3 days ago.   Denies abdominal pain, blood in stool. Feels that he is bloated with slightly distended abdomen.   + Passing gas.    Advised that patient be evaluated in clinic for concerns.   Scheduled same day visit with Dr. Elicia this afternoon.   Chiquita JAYSON English, RN    .

## 2024-04-18 NOTE — Progress Notes (Signed)
" ° ° °  SUBJECTIVE:   CHIEF COMPLAINT / HPI:    Discussed the use of AI scribe software for clinical note transcription with the patient, who gave verbal consent to proceed.  History of Present Illness Eric Hooper is a 48 year old male who presents with chronic constipation.  Constipation - Chronic infrequent and difficult bowel movements - Last minimal bowel movement occurred two days ago - Bowel movements sometimes mushy - No associated abdominal pain or blood in stool - Sensation of incomplete evacuation and difficulty passing stool - Significant bloating and feeling of stool being 'stuck' - No improvement with fiber supplementation (five fiber pills daily for one year, divided as two in the morning, one in the evening, two before bed) - High-fiber diet including salads, greens, peanuts, and grapes, but these foods no longer stimulate bowel movements - Drinks large quantities of water for hydration - Attributes constipation to a medication taken every three months for TTP (medication not specified)  Colonoscopy findings - Colonoscopy performed a few months ago for similar symptoms - No abnormalities identified     PERTINENT  PMH / PSH: TTP. T2DM  OBJECTIVE:   BP 120/82   Pulse 60   Wt 175 lb 12.8 oz (79.7 kg)   SpO2 99%   BMI 25.96 kg/m   General: Alert, pleasant man. NAD. HEENT: NCAT. MMM. CV: RRR, no murmurs.   Resp: CTAB, no wheezing or crackles. Normal WOB on RA.  Abm: Soft, nontender, nondistended. BS present. Ext: Moves all ext spontaneously Skin: Warm, well perfused   ASSESSMENT/PLAN:   Assessment & Plan Constipation, unspecified constipation type May be related to rituximab  for TTP. No red flag signs such as bloody stool, weight loss, fevers, peritoneal signs. Will do bowel cleanout for 3-5 days, then start a regular daily bowel regimen.     Follow this bowel regimen every day until you are having consistent soft, painless bowel movements that require  minimal pushing. - Gatorade 20oz bottle mixed with 2 capfuls (34ml) of miralax  twice a day. - Senna 2 tablets twice a day  Once your bowel movements improve, you can decrease the dose. A good daily bowel regimen for chronic constipation: - Fiber supplement 1 serving with each meal. You can get gummies, capsules, or drink powder. Make sure you drink them with a full glass of water for them to work properly.    Twyla Nearing, MD [facility] "

## 2024-04-21 ENCOUNTER — Encounter: Payer: Self-pay | Admitting: Oncology

## 2024-04-21 ENCOUNTER — Encounter (HOSPITAL_COMMUNITY): Payer: Self-pay | Admitting: Otolaryngology

## 2024-04-21 ENCOUNTER — Other Ambulatory Visit: Payer: Self-pay

## 2024-04-21 NOTE — Progress Notes (Addendum)
 SDW CALL  Patient was given pre-op instructions over the phone. The opportunity was given for the patient to ask questions. No further questions asked. Patient verbalized understanding of instructions given.   PCP - Toma, Dimitry, MD  Cardiologist - Tobb, Kardie, DO  Hematologist/Oncologist- Arley Cloretta COME  PPM/ICD -denies  Device Orders -  Rep Notified -   Chest x-ray - 06/14/23 EKG - 04/01/24 Stress Test - denies ECHO - 03/10/24 Cardiac Cath - denies  Sleep Study - denies CPAP -   Fasting Blood Sugar - 70-100 Checks Blood Sugar 3 times a day  Blood Thinner Instructions:na Aspirin  Instructions:Follow your surgeon's instructions for holding aspirin . If no instructions were given, you must call the office.   ERAS Protcol -clears until 0830 PRE-SURGERY Ensure or G2-   COVID TEST- na   Anesthesia review: yes-hx TTP,CAD,HTN,DM  Patient denies shortness of breath, fever, cough and chest pain over the phone call    Take these medicines the morning of surgery with A SIP OF WATER:  amLODipine  (NORVASC )  atorvastatin  (LIPITOR)  carvedilol  (COREG )  esomeprazole  (NEXIUM )   As of today, STOP taking any Aspirin  (unless otherwise instructed by your surgeon) Aleve, Naproxen, Ibuprofen, Motrin, Advil, Goody's, BC's, all herbal medications, fish oil, and all vitamins.   Oral Hygiene is also important to reduce your risk of infection.  Remember - BRUSH YOUR TEETH THE MORNING OF SURGERY WITH YOUR REGULAR TOOTHPASTE

## 2024-04-24 ENCOUNTER — Other Ambulatory Visit: Payer: Self-pay | Admitting: Otolaryngology

## 2024-04-24 ENCOUNTER — Encounter: Payer: Self-pay | Admitting: Oncology

## 2024-04-24 ENCOUNTER — Telehealth: Payer: Self-pay | Admitting: Cardiology

## 2024-04-24 DIAGNOSIS — Z01810 Encounter for preprocedural cardiovascular examination: Secondary | ICD-10-CM

## 2024-04-24 NOTE — Telephone Encounter (Signed)
 I will forward back to preop for clarification as to is pt needing to see Dr. Sheena 05/2024 as planned before being cleared. I have placed echo order today as per preop APP stated. Pt did also have an echo 02/2024.   Procedure is planned for 04/28/24 with Dr. Llewellyn

## 2024-04-24 NOTE — Telephone Encounter (Signed)
 She will need to be seen before the procedure on 04/28/2024, as Dr. Sheena placed a monitor and added medications for NSVT. he echo was completed as you documented so no need to repeat this. Monitor is resulted but no comment was made concerning the results. Note on echo stated  Hello I was waiting to talk to you about your results once I have your monitor and the echo present. I am still waiting for the monitor but since she called I will go ahead and let you know that your ultrasound of the heart shows that your heart function is not normal it is mildly depressed compared to a normal heart function in August 2024. I suspect that this is because of frequent PVCs but I will not know how often you are experiencing if he sees until I get the information from your monitor. I would like to increase your carvedilol  to 12.5 mg daily. You will need additional medications that we can initiate at your follow-up visit  Dr.Tobb   Can she be seen before the procedure in the office?  The echo was completed as you documented. She can be seen by APP or Dr.Tobb.

## 2024-04-24 NOTE — Telephone Encounter (Signed)
 Patient needs echo before clearance per Dr.Tobb

## 2024-04-24 NOTE — Telephone Encounter (Signed)
 PER PREOP APP LAMARR SATTERFIELD, DNP THE PT WILL NEED ECHO FOR PREOP CLEARANCE BEFORE SEEING DR. SHEENA 05/2024.

## 2024-04-24 NOTE — Addendum Note (Signed)
 Addended by: WYNETTA NIELS HERO on: 04/24/2024 01:48 PM   Modules accepted: Orders

## 2024-04-24 NOTE — Progress Notes (Signed)
 Anesthesia Chart Review: Same day workup  48 year old male follows with hematology for history of TTP maintained on rituximab .  Last seen by Dr. Cloretta 04/04/2024 and reportedly tolerating rituximab  well, no changes to management.  Recent cardiology evaluation of PVCs and intermittent chest discomfort.  CCTA 11/19/2022 showed mild nonobstructive CAD.  Echo 03/10/2024 showed LVEF 45 to 50%, normal wall motion, normal RV.  Event monitor 02/2024 showed frequent PVCs (28.6%).  Mildly reduced LVEF felt secondary to PVC burden. Dr. Sheena increased carvedilol  to 12.5 mg daily.  He was continued on amlodipine  10 mg daily, eplerenone  25 mg daily, losartan  100 mg daily.  Other pertinent history includes former smoker (quit 12/2023), GERD on PPI, non-insulin -dependent DM2.

## 2024-04-24 NOTE — Telephone Encounter (Signed)
"  ° °  Pre-operative Risk Assessment    Patient Name: Eric Hooper  DOB: [birth year] MRN: [id number]   Date of last office visit: 03/10/2024 Date of next office visit: 06/01/2024   Request for Surgical Clearance    Procedure:  sinsus surgery FESS  Date of Surgery:  Clearance 04/28/24                                Surgeon:  DR. Pandora Shoe Surgeon's Group or Practice Name:  [facility] Phone number:  [phone] Fax number:  [phone]   Type of Clearance Requested:   - Medical  - Pharmacy:  Hold TBD by Card      Type of Anesthesia:  General    Additional requests/questions:    SignedBernarda JONETTA Fireman   04/24/2024, 1:21 PM   "

## 2024-04-24 NOTE — Telephone Encounter (Signed)
 I s/w the pt and he agrees to appt 04/25/24 10:55 with Jon Hails, PAC at [facility] for preop clearance. Pt aware he will keep his appt 05/2024 as planned with Dr. Sheena.

## 2024-04-25 ENCOUNTER — Ambulatory Visit: Attending: Physician Assistant | Admitting: Physician Assistant

## 2024-04-25 ENCOUNTER — Ambulatory Visit: Attending: Physician Assistant

## 2024-04-25 ENCOUNTER — Encounter: Payer: Self-pay | Admitting: Physician Assistant

## 2024-04-25 VITALS — BP 116/80 | HR 74 | Resp 14 | Ht 70.0 in | Wt 173.8 lb

## 2024-04-25 DIAGNOSIS — I10 Essential (primary) hypertension: Secondary | ICD-10-CM

## 2024-04-25 DIAGNOSIS — I493 Ventricular premature depolarization: Secondary | ICD-10-CM | POA: Diagnosis not present

## 2024-04-25 DIAGNOSIS — Z01810 Encounter for preprocedural cardiovascular examination: Secondary | ICD-10-CM | POA: Diagnosis not present

## 2024-04-25 DIAGNOSIS — I429 Cardiomyopathy, unspecified: Secondary | ICD-10-CM

## 2024-04-25 MED ORDER — CARVEDILOL 12.5 MG PO TABS: 12.5000 mg | ORAL_TABLET | Freq: Every day | ORAL | 3 refills | Status: AC

## 2024-04-25 NOTE — Patient Instructions (Signed)
 Medication Instructions:  CONTINUE 12.5 TWICE DAILY CARVEDILOL  *If you need a refill on your cardiac medications before your next appointment, please call your pharmacy*   Testing/Procedures: YOUR PROVIDER HAS REQUESTED YOU WEAR A HEART MONITOR   Follow-Up: At [facility], you and your health needs are our priority.  As part of our continuing mission to provide you with exceptional heart care, our providers are all part of one team.  This team includes your primary Cardiologist (physician) and Advanced Practice Providers or APPs (Physician Assistants and Nurse Practitioners) who all work together to provide you with the care you need, when you need it.  Your next appointment:   6 week(s)  Provider:   Kardie Tobb, DO   We recommend signing up for the patient portal called MyChart.  Sign up information is provided on this After Visit Summary.  MyChart is used to connect with patients for Virtual Visits (Telemedicine).  Patients are able to view lab/test results, encounter notes, upcoming appointments, etc.  Non-urgent messages can be sent to your provider as well.   To learn more about what you can do with MyChart, go to [email or web address].   Other Instructions ZIO XT- Long Term Monitor Instructions  Your physician has requested you wear a ZIO patch monitor for 7 days.  This is a single patch monitor. [employer] supplies one patch monitor per enrollment. Additional stickers are not available. Please do not apply patch if you will be having a Nuclear Stress Test,  Echocardiogram, Cardiac CT, MRI, or Chest Xray during the period you would be wearing the  monitor. The patch cannot be worn during these tests. You cannot remove and re-apply the  ZIO XT patch monitor.  Your ZIO patch monitor will be mailed 3 day USPS to your address on file. It may take 3-5 days  to receive your monitor after you have been enrolled.  Once you have received your monitor, please review the enclosed  instructions. Your monitor  has already been registered assigning a specific monitor serial # to you.  Billing and Patient Assistance Program Information  We have supplied [employer] with any of your insurance information on file for billing purposes. [employer] offers a sliding scale Patient Assistance Program for patients that do not have  insurance, or whose insurance does not completely cover the cost of the ZIO monitor.  You must apply for the Patient Assistance Program to qualify for this discounted rate.  To apply, please call [employer] at [phone], select option 4, select option 2, ask to apply for  Patient Assistance Program. Meredeth will ask your household income, and how many people  are in your household. They will quote your out-of-pocket cost based on that information.  [employer] will also be able to set up a 41-month, interest-free payment plan if needed.  Applying the monitor   Shave hair from upper left chest.  Hold abrader disc by orange tab. Rub abrader in 40 strokes over the upper left chest as  indicated in your monitor instructions.  Clean area with 4 enclosed alcohol pads. Let dry.  Apply patch as indicated in monitor instructions. Patch will be placed under collarbone on left  side of chest with arrow pointing upward.  Rub patch adhesive wings for 2 minutes. Remove white label marked 1. Remove the white  label marked 2. Rub patch adhesive wings for 2 additional minutes.  While looking in a mirror, press and release button in center of patch. A small green light will  flash  3-4 times. This will be your only indicator that the monitor has been turned on.  Do not shower for the first 24 hours. You may shower after the first 24 hours.  Press the button if you feel a symptom. You will hear a small click. Record Date, Time and  Symptom in the Patient Logbook.  When you are ready to remove the patch, follow instructions on the last 2 pages of Patient  Logbook. Stick patch  monitor onto the last page of Patient Logbook.  Place Patient Logbook in the blue and white box. Use locking tab on box and tape box closed  securely. The blue and white box has prepaid postage on it. Please place it in the mailbox as  soon as possible. Your physician should have your test results approximately 7 days after the  monitor has been mailed back to [facility].  Call [facility] Customer Care at [phone] if you have questions regarding  your ZIO XT patch monitor. Call them immediately if you see an orange light blinking on your  monitor.  If your monitor falls off in less than 4 days, contact our Monitor department at [phone].  If your monitor becomes loose or falls off after 4 days call [employer] at [phone] for  suggestions on securing your monitor

## 2024-04-25 NOTE — Progress Notes (Unsigned)
 Patient enrolled for [facility] [employer] to ship a 7 day long term holter monitor with sensitive skin strips to his address on file.  Dr. Sheena to read.

## 2024-04-25 NOTE — Anesthesia Preprocedure Evaluation (Signed)
 "                                  Anesthesia Evaluation  Patient identified by MRN, date of birth, ID band Patient awake    Reviewed: Allergy  & Precautions, NPO status , Patient's Chart, lab work & pertinent test results, reviewed documented beta blocker date and time   History of Anesthesia Complications Negative for: history of anesthetic complications  Airway Mallampati: I  TM Distance: >3 FB Neck ROM: Full    Dental  (+) Dental Advisory Given   Pulmonary former smoker   breath sounds clear to auscultation       Cardiovascular hypertension, Pt. on medications and Pt. on home beta blockers (-) angina + CAD (CCTA 11/19/2022 showed mild nonobstructive CAD)   Rhythm:Regular Rate:Normal  02/2024 ECHO: EF 45 to 50%.  1. LV EF 48.6 %. The left ventricle has mildly decreased function, no regional wall motion abnormalities. Left ventricular diastolic parameters were normal.   2. RVF is normal. The right ventricular size is normal.   3. Left atrial size was mildly dilated.   4. The mitral valve is grossly normal. Trivial mitral valve regurgitation.   5. The aortic valve is tricuspid. Aortic valve regurgitation is not visualized.     Neuro/Psych  Headaches    GI/Hepatic Neg liver ROS,GERD  Medicated and Controlled,,  Endo/Other  diabetes (glu 105), Oral Hypoglycemic Agents    Renal/GU Renal InsufficiencyRenal diseasestones     Musculoskeletal   Abdominal   Peds  Hematology  (+) Blood dyscrasia (h/o TTP: plt 156k (04/04/2024))   Anesthesia Other Findings   Reproductive/Obstetrics                              Anesthesia Physical Anesthesia Plan  ASA: 3  Anesthesia Plan: General   Post-op Pain Management: Tylenol  PO (pre-op)*   Induction: Intravenous  PONV Risk Score and Plan: 2 and Ondansetron  and Dexamethasone   Airway Management Planned: Oral ETT  Additional Equipment: None  Intra-op Plan:   Post-operative Plan:  Extubation in OR  Informed Consent: I have reviewed the patients History and Physical, chart, labs and discussed the procedure including the risks, benefits and alternatives for the proposed anesthesia with the patient or authorized representative who has indicated his/her understanding and acceptance.     Dental advisory given  Plan Discussed with: CRNA and Surgeon  Anesthesia Plan Comments: (PAT note by Lynwood Hope, PA-C: 48 year old male follows with hematology for history of TTP maintained on rituximab .  Last seen by Dr. Cloretta 04/04/2024 and reportedly tolerating rituximab  well, no changes to management. Cleared for surgery in clearance dated 04/12/24.  Recent cardiology evaluation for PVCs and intermittent chest discomfort.  CCTA 11/19/2022 showed mild nonobstructive CAD.  Echo 03/10/2024 showed LVEF 45 to 50% (previously 55-60% by echo 11/2022), normal wall motion, normal RV.  Event monitor 02/2024 showed frequent PVCs (28.6%).  Mildly reduced LVEF felt secondary to PVC burden. Dr. Sheena increased carvedilol  to 12.5 mg daily.  He was continued on amlodipine  10 mg daily, eplerenone  25 mg daily, losartan  100 mg daily.  Seen by Jon Hails, PA-C 04/25/24 for preop eval. Per note, HFmrEF -Recent echocardiogram with an LVEF 45-50% -Question if mild cardiomyopathy due to PVCs seen on recent heart monitor -GDMT: 12.5 mg carvedilol  twice daily, 100 mg losartan , 25 mg eplerenone  -- euvolemic on exam --  repeat echo as below. Frequent PVCs - Heart monitor with 28.6% PVC burden - Currently on 12.5 mg carvedilol  twice daily -- 5 pages of rhythm strip demonstrated zero PVCs -- will continue current BB -- he is scheduled for sinus surgery this Friday and I think this is fine -- will complete a heart monitor 2 weeks after surgery to evaluate PVC burden -- if significantly reduced, then continue BB and plan to repeat an echo in 2.5 months.  Other pertinent history includes former smoker (quit 12/2023), GERD on  PPI, non-insulin -dependent DM2.  CMP and CBC 04/04/24 reviewed, unremarkable.   Pt will need DOS evaluation.   EKG 04/24/24: NSR. Rate 62.  Event monitor 02/2024: Conclusion: This study showed the following:                       1.  Paroxysmal supraventricular tachycardia                       2.  Frequent premature ventricular complexes (28.6%, [id number])  TTE 03/10/24: 1. Left ventricular ejection fraction, by estimation, is 45 to 50%. Left  ventricular ejection fraction by 2D MOD biplane is 48.6 %. The left  ventricle has mildly decreased function. The left ventricle has no  regional wall motion abnormalities. Left  ventricular diastolic parameters were normal.  2. Right ventricular systolic function is normal. The right ventricular  size is normal. Tricuspid regurgitation signal is inadequate for assessing  PA pressure.  3. Left atrial size was mildly dilated.  4. The mitral valve is grossly normal. Trivial mitral valve  regurgitation.  5. The aortic valve is tricuspid. Aortic valve regurgitation is not  visualized.  6. The inferior vena cava is normal in size with greater than 50%  respiratory variability, suggesting right atrial pressure of 3 mmHg.  7. Rhythm strip during this exam demonstrates normal sinus rhythm and  premature ventricular contractions.   Comparison(s): Changes from prior study are noted. 11/26/2022: LVEF 55-60%.    )         Anesthesia Quick Evaluation  "

## 2024-04-25 NOTE — Progress Notes (Signed)
 " Cardiology Office Note:    Date:  04/25/2024   ID:  Eric Hooper, DOB 1975-07-25, MRN [id number]  PCP:  Toma Matas, MD   [facility] Providers Cardiologist:  Dub Huntsman, DO     Referring MD: Toma Matas, MD   Chief Complaint  Patient presents with   Follow-up    PVC, HFmrEF    History of Present Illness:    Eric Hooper is a 48 y.o. male with a hx of hypertension, TTP, DM2, tobacco abuse, and chest pain.  He was initially referred to cardiology in 2020 for chest pain.  Echocardiogram at that time with an LVEF 55-60%, normal RV systolic function, and no significant valvular disease.  He was seen back with cardiology in 2024 for chest pain and underwent CT coronary 11/19/2022 that demonstrated coronary calcium  score of 19 placing him at the 85th percentile with nonobstructive CAD.  Chest pain felt noncardiac at that time.  Echocardiogram 11/2022 continues to demonstrate LVEF 55-60% with no RWMA and mild LVH of the basal septal segment.  He saw Dr. Huntsman 03/10/2024 for preoperative risk evaluation prior to sinus surgery.  Echocardiogram was obtained and showed an LVEF 45-50%.  Due to PVCs, heart monitor was obtained and demonstrated predominant rhythm was sinus rhythm but 28.6% PVCs.  He was instructed to increase his carvedilol  to 12.5 mg twice daily.  Question if cardiomyopathy due to PVC burden.  He states he is experiencing increases in BG with increased coreg . BG typically 70-80s and is now in the 100s.   He reports indigestion and chest pain due to post nasal drip. He remains active with walking 3-5 miles in neighborhood, last walk was 1 month ago, took 1 hr. No chest pain with activity. He can walk grocery store and carries groceries in from the car.   On exam, he is very upset that he was never previously treated for PVCs. Long discussion regarding recent studies.   PVCs on exam, we completed 5 pages of rhythm strip which demonstrated zero PVCs.   Past  Medical History:  Diagnosis Date   At risk for dysfunction of heart 07/11/2018   Blood dyscrasia    Cancer (HCC)    Chest wall pain 08/14/2018   Controlled diabetes mellitus type 2 with complications (HCC) 02/25/2018   Diabetes mellitus (HCC) 02/01/2017   Diabetes mellitus without complication (HCC)    non-insulin  dependent   Diverticulosis 02/01/2017   on CT scan abd/pelvis   Eczema    GERD (gastroesophageal reflux disease)    Hypertension    Kidney stones 02/03/2017   Leg swelling 05/12/2017   Macrocytic anemia 02/01/2017   Microscopic hematuria 02/01/2017   Mixed hyperlipidemia 02/28/2018   Phlebitis 05/12/2017   Recurrent upper respiratory infection (URI)    Renal insufficiency 02/03/2017   Smoker 04/13/2017    Past Surgical History:  Procedure Laterality Date   COLONOSCOPY     ELBOW SURGERY Right    ESOPHAGEAL MANOMETRY N/A 09/22/2023   Procedure: MANOMETRY, ESOPHAGUS;  Surgeon: Legrand Victory LITTIE DOUGLAS, MD;  Location: [facility];  Service: Gastroenterology;  Laterality: N/A;   GANGLION CYST EXCISION Left    IR FLUORO GUIDE CV LINE RIGHT  02/02/2017   IR FLUORO GUIDE CV LINE RIGHT  11/23/2021   IR FLUORO GUIDE CV LINE RIGHT  11/28/2021   IR REMOVAL TUN CV CATH W/O FL  02/26/2022   IR US  GUIDE VASC ACCESS RIGHT  02/02/2017   IR US  GUIDE VASC ACCESS RIGHT  11/23/2021  PH IMPEDANCE STUDY N/A 09/22/2023   Procedure: IMPEDANCE PH STUDY, ESOPHAGUS;  Surgeon: Legrand Victory LITTIE DOUGLAS, MD;  Location: [facility];  Service: Gastroenterology;  Laterality: N/A;   UPPER GASTROINTESTINAL ENDOSCOPY      Current Medications: Active Medications[1]   Allergies:   Bee venom and Ibuprofen   Social History   Socioeconomic History   Marital status: Single    Spouse name: Not on file   Number of children: 2   Years of education: Not on file   Highest education level: 12th grade  Occupational History   Occupation: location manager   Occupation: unemployed  Tobacco Use   Smoking  status: Former    Current packs/day: 0.00    Average packs/day: 0.5 packs/day for 15.0 years (7.5 ttl pk-yrs)    Types: Cigarettes    Quit date: 12/2023    Years since quitting: 0.3    Passive exposure: Current   Smokeless tobacco: Never  Vaping Use   Vaping status: Never Used  Substance and Sexual Activity   Alcohol use: Not Currently   Drug use: No   Sexual activity: Yes  Other Topics Concern   Not on file  Social History Narrative   ** Merged History Encounter **       Social Drivers of Health   Tobacco Use: Medium Risk (04/25/2024)   Patient History    Smoking Tobacco Use: Former    Smokeless Tobacco Use: Never    Passive Exposure: Current  Physicist, Medical Strain: Low Risk (03/17/2024)   Overall Financial Resource Strain (CARDIA)    Difficulty of Paying Living Expenses: Not very hard  Food Insecurity: Food Insecurity Present (03/17/2024)   Epic    Worried About Programme Researcher, Broadcasting/film/video in the Last Year: Sometimes true    Ran Out of Food in the Last Year: Sometimes true  Transportation Needs: No Transportation Needs (03/17/2024)   Epic    Lack of Transportation (Medical): No    Lack of Transportation (Non-Medical): No  Physical Activity: Insufficiently Active (03/17/2024)   Exercise Vital Sign    Days of Exercise per Week: 3 days    Minutes of Exercise per Session: 20 min  Stress: Stress Concern Present (03/17/2024)   [employer] of Occupational Health - Occupational Stress Questionnaire    Feeling of Stress: To some extent  Social Connections: Moderately Integrated (03/17/2024)   Social Connection and Isolation Panel    Frequency of Communication with Friends and Family: More than three times a week    Frequency of Social Gatherings with Friends and Family: More than three times a week    Attends Religious Services: 1 to 4 times per year    Active Member of [employer] or Organizations: No    Attends Banker Meetings: Not on file    Marital Status:  Married  Depression (PHQ2-9): Low Risk (04/18/2024)   Depression (PHQ2-9)    PHQ-2 Score: 0  Alcohol Screen: Not on file  Housing: Low Risk (03/17/2024)   Epic    Unable to Pay for Housing in the Last Year: No    Number of Times Moved in the Last Year: 0    Homeless in the Last Year: No  Utilities: Not At Risk (12/13/2023)   Received from [facility]    In the past 12 months has the electric, gas, oil, or water company threatened to shut off services in your home?: No  Health Literacy: Not on file  Family History: The patient's family history includes Allergic rhinitis in his father and mother; Colon cancer in his maternal aunt; Diabetes type II in his father; Hyperlipidemia in his mother; Hypertension in his mother. There is no history of Esophageal cancer, Rectal cancer, or Stomach cancer.  ROS:   Please see the history of present illness.     All other systems reviewed and are negative.  EKGs/Labs/Other Studies Reviewed:    The following studies were reviewed today:  EKG Interpretation Date/Time:  Tuesday April 25 2024 11:50:41 EST Ventricular Rate:  62 PR Interval:  144 QRS Duration:  104 QT Interval:  440 QTC Calculation: 446 R Axis:   75  Text Interpretation: Normal sinus rhythm Normal ECG When compared with ECG of 01-Apr-2024 14:34, Premature ventricular complexes are no longer Present T wave inversion less evident in Inferior leads T wave inversion no longer evident in Lateral leads Confirmed by Madie Slough ([id number]) on 04/25/2024 12:13:49 PM    Recent Labs: 05/20/2023: B Natriuretic Peptide 31.6 10/01/2023: TSH 0.540 03/10/2024: Magnesium  2.1 04/04/2024: ALT 30; BUN 16; Creatinine 0.88; Hemoglobin 15.7; Platelet Count 156; Potassium 3.6; Sodium 141  Recent Lipid Panel    Component Value Date/Time   CHOL 94 (L) 05/29/2022 1227   TRIG 53 05/29/2022 1227   HDL 42 05/29/2022 1227   CHOLHDL 2.2 05/29/2022 1227   LDLCALC 39 05/29/2022 1227     Risk  Assessment/Calculations:                Physical Exam:    VS:  BP 116/80 (BP Location: Right Arm, Patient Position: Sitting, Cuff Size: Normal)   Pulse 74   Resp 14   Ht 5' 10 (1.778 m)   Wt 173 lb 12.8 oz (78.8 kg)   SpO2 99%   BMI 24.94 kg/m     Wt Readings from Last 3 Encounters:  04/25/24 173 lb 12.8 oz (78.8 kg)  04/18/24 175 lb 12.8 oz (79.7 kg)  04/04/24 176 lb 3.2 oz (79.9 kg)     GEN:  Well nourished, well developed in no acute distress HEENT: Normal NECK: No JVD; No carotid bruits LYMPHATICS: No lymphadenopathy CARDIAC: RRR, no murmurs, rubs, gallops RESPIRATORY:  Clear to auscultation without rales, wheezing or rhonchi  ABDOMEN: Soft, non-tender, non-distended MUSCULOSKELETAL:  No edema; No deformity  SKIN: Warm and dry NEUROLOGIC:  Alert and oriented x 3 PSYCHIATRIC:  Normal affect   ASSESSMENT:    1. PVC (premature ventricular contraction)   2. Cardiomyopathy, unspecified type (HCC)   3. Primary hypertension   4. Preoperative cardiovascular examination    PLAN:    In order of problems listed above:  HFmrEF -Recent echocardiogram with an LVEF 45-50% -Question if mild cardiomyopathy due to PVCs seen on recent heart monitor -GDMT: 12.5 mg carvedilol  twice daily, 100 mg losartan , 25 mg eplerenone  -- euvolemic on exam -- repeat echo as below   Frequent PVCs - Heart monitor with 28.6% PVC burden - Currently on 12.5 mg carvedilol  twice daily -- 5 pages of rhythm strip demonstrated zero PVCs -- will continue current BB -- he is scheduled for sinus surgery this Friday and I think this is fine -- will complete a heart monitor 2 weeks after surgery to evaluate PVC burden -- if significantly reduced, then continue BB and plan to repeat an echo in 2.5 months   Hypertension - 12.5 mg carvedilol  twice daily, 100 mg losartan  daily, 25 mg eplerenone  daily, 10 mg amlodipine  -- will continue present medications for  now   Preoperative risk evaluation  prior to sinus surgery He reports to me he is already cleared. HFmrEF but no unstable cardiac conditions. Will continue to follow with repeat monitor and echo after surgery, as above.   Follow up with Dr. Sheena in 2 months.        Medication Adjustments/Labs and Tests Ordered: Current medicines are reviewed at length with the patient today.  Concerns regarding medicines are outlined above.  Orders Placed This Encounter  Procedures   LONG TERM MONITOR (3-14 DAYS)   EKG 12-Lead   Meds ordered this encounter  Medications   carvedilol  (COREG ) 12.5 MG tablet    Sig: Take 1 tablet (12.5 mg total) by mouth daily.    Dispense:  90 tablet    Refill:  3    Patient Instructions  Medication Instructions:  CONTINUE 12.5 TWICE DAILY CARVEDILOL  *If you need a refill on your cardiac medications before your next appointment, please call your pharmacy*   Testing/Procedures: YOUR PROVIDER HAS REQUESTED YOU WEAR A HEART MONITOR   Follow-Up: At [facility], you and your health needs are our priority.  As part of our continuing mission to provide you with exceptional heart care, our providers are all part of one team.  This team includes your primary Cardiologist (physician) and Advanced Practice Providers or APPs (Physician Assistants and Nurse Practitioners) who all work together to provide you with the care you need, when you need it.  Your next appointment:   6 week(s)  Provider:   Kardie Tobb, DO   We recommend signing up for the patient portal called MyChart.  Sign up information is provided on this After Visit Summary.  MyChart is used to connect with patients for Virtual Visits (Telemedicine).  Patients are able to view lab/test results, encounter notes, upcoming appointments, etc.  Non-urgent messages can be sent to your provider as well.   To learn more about what you can do with MyChart, go to [email or web address].   Other Instructions ZIO XT- Long Term Monitor  Instructions  Your physician has requested you wear a ZIO patch monitor for 7 days.  This is a single patch monitor. [employer] supplies one patch monitor per enrollment. Additional stickers are not available. Please do not apply patch if you will be having a Nuclear Stress Test,  Echocardiogram, Cardiac CT, MRI, or Chest Xray during the period you would be wearing the  monitor. The patch cannot be worn during these tests. You cannot remove and re-apply the  ZIO XT patch monitor.  Your ZIO patch monitor will be mailed 3 day USPS to your address on file. It may take 3-5 days  to receive your monitor after you have been enrolled.  Once you have received your monitor, please review the enclosed instructions. Your monitor  has already been registered assigning a specific monitor serial # to you.  Billing and Patient Assistance Program Information  We have supplied [employer] with any of your insurance information on file for billing purposes. [employer] offers a sliding scale Patient Assistance Program for patients that do not have  insurance, or whose insurance does not completely cover the cost of the ZIO monitor.  You must apply for the Patient Assistance Program to qualify for this discounted rate.  To apply, please call [employer] at [phone], select option 4, select option 2, ask to apply for  Patient Assistance Program. Meredeth will ask your household income, and how many people  are in your household. They will quote  your out-of-pocket cost based on that information.  [employer] will also be able to set up a 54-month, interest-free payment plan if needed.  Applying the monitor   Shave hair from upper left chest.  Hold abrader disc by orange tab. Rub abrader in 40 strokes over the upper left chest as  indicated in your monitor instructions.  Clean area with 4 enclosed alcohol pads. Let dry.  Apply patch as indicated in monitor instructions. Patch will be placed under collarbone on left  side  of chest with arrow pointing upward.  Rub patch adhesive wings for 2 minutes. Remove white label marked 1. Remove the white  label marked 2. Rub patch adhesive wings for 2 additional minutes.  While looking in a mirror, press and release button in center of patch. A small green light will  flash 3-4 times. This will be your only indicator that the monitor has been turned on.  Do not shower for the first 24 hours. You may shower after the first 24 hours.  Press the button if you feel a symptom. You will hear a small click. Record Date, Time and  Symptom in the Patient Logbook.  When you are ready to remove the patch, follow instructions on the last 2 pages of Patient  Logbook. Stick patch monitor onto the last page of Patient Logbook.  Place Patient Logbook in the blue and white box. Use locking tab on box and tape box closed  securely. The blue and white box has prepaid postage on it. Please place it in the mailbox as  soon as possible. Your physician should have your test results approximately 7 days after the  monitor has been mailed back to [facility].  Call [facility] Customer Care at [phone] if you have questions regarding  your ZIO XT patch monitor. Call them immediately if you see an orange light blinking on your  monitor.  If your monitor falls off in less than 4 days, contact our Monitor department at [phone].  If your monitor becomes loose or falls off after 4 days call [employer] at [phone] for  suggestions on securing your monitor            Signed, Jon Nat Hails, [place]  04/25/2024 1:56 PM    [facility]     [1]  Current Meds  Medication Sig   esomeprazole  (NEXIUM ) 40 MG capsule TAKE 1 CAPSULE (40 MG TOTAL) BY MOUTH 2 (TWO) TIMES DAILY BEFORE A MEAL. (Patient taking differently: Take 40 mg by mouth daily as needed.)   "

## 2024-04-26 NOTE — Progress Notes (Signed)
 Spoke with the pt. He will arrive on Friday at 0830.

## 2024-04-28 ENCOUNTER — Encounter (HOSPITAL_COMMUNITY): Payer: Self-pay | Admitting: Otolaryngology

## 2024-04-28 ENCOUNTER — Other Ambulatory Visit (HOSPITAL_COMMUNITY): Payer: Self-pay

## 2024-04-28 ENCOUNTER — Ambulatory Visit (HOSPITAL_COMMUNITY): Payer: Self-pay | Admitting: Physician Assistant

## 2024-04-28 ENCOUNTER — Encounter (HOSPITAL_COMMUNITY)
Admission: RE | Disposition: A | Discharge status: Home / Self Care | Payer: Self-pay | Source: Home / Self Care | Attending: Otolaryngology

## 2024-04-28 ENCOUNTER — Other Ambulatory Visit: Payer: Self-pay

## 2024-04-28 ENCOUNTER — Ambulatory Visit (HOSPITAL_COMMUNITY)
Admission: RE | Admit: 2024-04-28 | Discharge: 2024-04-28 | Disposition: A | Discharge status: Home / Self Care | Attending: Otolaryngology | Admitting: Otolaryngology

## 2024-04-28 DIAGNOSIS — J324 Chronic pansinusitis: Secondary | ICD-10-CM | POA: Diagnosis present

## 2024-04-28 DIAGNOSIS — M3119 Other thrombotic microangiopathy: Secondary | ICD-10-CM | POA: Diagnosis not present

## 2024-04-28 DIAGNOSIS — Z833 Family history of diabetes mellitus: Secondary | ICD-10-CM | POA: Insufficient documentation

## 2024-04-28 DIAGNOSIS — K219 Gastro-esophageal reflux disease without esophagitis: Secondary | ICD-10-CM | POA: Diagnosis not present

## 2024-04-28 DIAGNOSIS — J301 Allergic rhinitis due to pollen: Secondary | ICD-10-CM | POA: Insufficient documentation

## 2024-04-28 DIAGNOSIS — J343 Hypertrophy of nasal turbinates: Secondary | ICD-10-CM | POA: Insufficient documentation

## 2024-04-28 DIAGNOSIS — Z87891 Personal history of nicotine dependence: Secondary | ICD-10-CM

## 2024-04-28 DIAGNOSIS — I10 Essential (primary) hypertension: Secondary | ICD-10-CM

## 2024-04-28 DIAGNOSIS — I493 Ventricular premature depolarization: Secondary | ICD-10-CM | POA: Diagnosis not present

## 2024-04-28 DIAGNOSIS — I5020 Unspecified systolic (congestive) heart failure: Secondary | ICD-10-CM | POA: Insufficient documentation

## 2024-04-28 DIAGNOSIS — I2510 Atherosclerotic heart disease of native coronary artery without angina pectoris: Secondary | ICD-10-CM

## 2024-04-28 DIAGNOSIS — H9193 Unspecified hearing loss, bilateral: Secondary | ICD-10-CM | POA: Diagnosis not present

## 2024-04-28 DIAGNOSIS — Z7984 Long term (current) use of oral hypoglycemic drugs: Secondary | ICD-10-CM | POA: Insufficient documentation

## 2024-04-28 DIAGNOSIS — I110 Hypertensive heart disease with heart failure: Secondary | ICD-10-CM | POA: Insufficient documentation

## 2024-04-28 DIAGNOSIS — J3081 Allergic rhinitis due to animal (cat) (dog) hair and dander: Secondary | ICD-10-CM | POA: Diagnosis not present

## 2024-04-28 DIAGNOSIS — Z8249 Family history of ischemic heart disease and other diseases of the circulatory system: Secondary | ICD-10-CM | POA: Insufficient documentation

## 2024-04-28 DIAGNOSIS — N289 Disorder of kidney and ureter, unspecified: Secondary | ICD-10-CM | POA: Insufficient documentation

## 2024-04-28 DIAGNOSIS — E118 Type 2 diabetes mellitus with unspecified complications: Secondary | ICD-10-CM | POA: Insufficient documentation

## 2024-04-28 DIAGNOSIS — Z79899 Other long term (current) drug therapy: Secondary | ICD-10-CM | POA: Diagnosis not present

## 2024-04-28 DIAGNOSIS — D6959 Other secondary thrombocytopenia: Secondary | ICD-10-CM | POA: Diagnosis present

## 2024-04-28 HISTORY — DX: Disease of blood and blood-forming organs, unspecified: D75.9

## 2024-04-28 HISTORY — PX: NASAL SINUS SURGERY: SHX719

## 2024-04-28 HISTORY — PX: TURBINATE REDUCTION: SHX6157

## 2024-04-28 LAB — GLUCOSE, CAPILLARY
Glucose-Capillary: 105 mg/dL — ABNORMAL HIGH (ref 70–99)
Glucose-Capillary: 108 mg/dL — ABNORMAL HIGH (ref 70–99)
Glucose-Capillary: 123 mg/dL — ABNORMAL HIGH (ref 70–99)

## 2024-04-28 SURGERY — SINUS SURGERY, ENDOSCOPIC
Anesthesia: General | Laterality: Bilateral

## 2024-04-28 MED ORDER — OXYCODONE HCL 5 MG/5ML PO SOLN: 5.0000 mg | Freq: Once | ORAL | Status: DC | PRN

## 2024-04-28 MED ORDER — DEXAMETHASONE SOD PHOSPHATE PF 10 MG/ML IJ SOLN
INTRAMUSCULAR | Status: DC | PRN
  Administered 2024-04-28: 10 mg via INTRAVENOUS

## 2024-04-28 MED ORDER — LIDOCAINE 2% (20 MG/ML) 5 ML SYRINGE
INTRAMUSCULAR | Status: DC | PRN
  Administered 2024-04-28: 40 mg via INTRAVENOUS

## 2024-04-28 MED ORDER — EPINEPHRINE HCL (NASAL) 0.1 % NA SOLN
NASAL | Status: AC
  Filled 2024-04-28: qty 60

## 2024-04-28 MED ORDER — MEPERIDINE HCL 25 MG/ML IJ SOLN: 6.2500 mg | INTRAMUSCULAR | Status: DC | PRN

## 2024-04-28 MED ORDER — DIPHENHYDRAMINE HCL 50 MG/ML IJ SOLN
12.5000 mg | Freq: Once | INTRAMUSCULAR | Status: AC
  Administered 2024-04-28: 12.5 mg via INTRAVENOUS

## 2024-04-28 MED ORDER — INSULIN ASPART 100 UNIT/ML IJ SOLN: 0.0000 [IU] | INTRAMUSCULAR | Status: DC | PRN

## 2024-04-28 MED ORDER — HYDROMORPHONE HCL 1 MG/ML IJ SOLN
INTRAMUSCULAR | Status: AC
  Filled 2024-04-28: qty 1

## 2024-04-28 MED ORDER — ONDANSETRON HCL 4 MG/2ML IJ SOLN
INTRAMUSCULAR | Status: DC | PRN
  Administered 2024-04-28: 4 mg via INTRAVENOUS

## 2024-04-28 MED ORDER — SODIUM CHLORIDE 0.9 % IR SOLN
Status: DC | PRN
  Administered 2024-04-28: 500 mL

## 2024-04-28 MED ORDER — DEXMEDETOMIDINE HCL IN NACL 80 MCG/20ML IV SOLN
INTRAVENOUS | Status: DC | PRN
  Administered 2024-04-28: 8 ug via INTRAVENOUS

## 2024-04-28 MED ORDER — DIPHENHYDRAMINE HCL 50 MG/ML IJ SOLN
INTRAMUSCULAR | Status: AC
  Filled 2024-04-28: qty 1

## 2024-04-28 MED ORDER — MIDAZOLAM HCL (PF) 2 MG/2ML IJ SOLN: 0.5000 mg | Freq: Once | INTRAMUSCULAR | Status: DC | PRN

## 2024-04-28 MED ORDER — MIDAZOLAM HCL (PF) 2 MG/2ML IJ SOLN
INTRAMUSCULAR | Status: DC | PRN
  Administered 2024-04-28: 2 mg via INTRAVENOUS

## 2024-04-28 MED ORDER — CARVEDILOL 12.5 MG PO TABS: 12.5000 mg | ORAL_TABLET | Freq: Once | ORAL | Status: AC

## 2024-04-28 MED ORDER — LABETALOL HCL 5 MG/ML IV SOLN
5.0000 mg | INTRAVENOUS | Status: AC | PRN
  Administered 2024-04-28 (×4): 5 mg via INTRAVENOUS

## 2024-04-28 MED ORDER — SUGAMMADEX SODIUM 200 MG/2ML IV SOLN
INTRAVENOUS | Status: DC | PRN
  Administered 2024-04-28: 200 mg via INTRAVENOUS

## 2024-04-28 MED ORDER — CEFADROXIL 500 MG PO CAPS
500.0000 mg | ORAL_CAPSULE | Freq: Two times a day (BID) | ORAL | 0 refills | Status: AC
  Filled 2024-04-28: qty 20, 10d supply, fill #0

## 2024-04-28 MED ORDER — ORAL CARE MOUTH RINSE: 15.0000 mL | Freq: Once | OROMUCOSAL | Status: AC

## 2024-04-28 MED ORDER — CEFAZOLIN SODIUM-DEXTROSE 2-3 GM-%(50ML) IV SOLR
INTRAVENOUS | Status: DC | PRN
  Administered 2024-04-28: 2 g via INTRAVENOUS

## 2024-04-28 MED ORDER — LIDOCAINE-EPINEPHRINE 1 %-1:100000 IJ SOLN
INTRAMUSCULAR | Status: DC | PRN
  Administered 2024-04-28: 13 mL

## 2024-04-28 MED ORDER — ACETAMINOPHEN 500 MG PO TABS
1000.0000 mg | ORAL_TABLET | Freq: Once | ORAL | Status: AC
  Administered 2024-04-28: 1000 mg via ORAL
  Filled 2024-04-28: qty 2

## 2024-04-28 MED ORDER — BACITRACIN ZINC 500 UNIT/GM EX OINT
TOPICAL_OINTMENT | CUTANEOUS | Status: AC
  Filled 2024-04-28: qty 28.35

## 2024-04-28 MED ORDER — CARVEDILOL 12.5 MG PO TABS
ORAL_TABLET | ORAL | Status: AC
  Administered 2024-04-28: 12.5 mg via ORAL
  Filled 2024-04-28: qty 1

## 2024-04-28 MED ORDER — PROPOFOL 10 MG/ML IV BOLUS
INTRAVENOUS | Status: AC
  Filled 2024-04-28: qty 20

## 2024-04-28 MED ORDER — PREDNISONE 10 MG PO TABS
ORAL_TABLET | ORAL | 0 refills | Status: AC
  Filled 2024-04-28: qty 30, 12d supply, fill #0

## 2024-04-28 MED ORDER — CHLORHEXIDINE GLUCONATE 0.12 % MT SOLN
15.0000 mL | Freq: Once | OROMUCOSAL | Status: AC
  Administered 2024-04-28: 15 mL via OROMUCOSAL
  Filled 2024-04-28: qty 15

## 2024-04-28 MED ORDER — LIDOCAINE-EPINEPHRINE 1 %-1:100000 IJ SOLN
INTRAMUSCULAR | Status: AC
  Filled 2024-04-28: qty 1

## 2024-04-28 MED ORDER — FENTANYL CITRATE (PF) 100 MCG/2ML IJ SOLN
INTRAMUSCULAR | Status: AC
  Filled 2024-04-28: qty 2

## 2024-04-28 MED ORDER — HYDROMORPHONE HCL 1 MG/ML IJ SOLN
0.2500 mg | INTRAMUSCULAR | Status: DC | PRN
  Administered 2024-04-28 (×2): 0.5 mg via INTRAVENOUS

## 2024-04-28 MED ORDER — ROCURONIUM BROMIDE 10 MG/ML (PF) SYRINGE
PREFILLED_SYRINGE | INTRAVENOUS | Status: DC | PRN
  Administered 2024-04-28 (×2): 5 mg via INTRAVENOUS
  Administered 2024-04-28: 60 mg via INTRAVENOUS
  Administered 2024-04-28: 20 mg via INTRAVENOUS

## 2024-04-28 MED ORDER — EPINEPHRINE HCL (NASAL) 0.1 % NA SOLN
NASAL | Status: AC
  Filled 2024-04-28: qty 30

## 2024-04-28 MED ORDER — LACTATED RINGERS IV SOLN: INTRAVENOUS | Status: DC

## 2024-04-28 MED ORDER — HYDROCODONE-ACETAMINOPHEN 5-325 MG PO TABS
1.0000 | ORAL_TABLET | Freq: Four times a day (QID) | ORAL | 0 refills | Status: AC | PRN
  Filled 2024-04-28: qty 10, 3d supply, fill #0

## 2024-04-28 MED ORDER — LABETALOL HCL 5 MG/ML IV SOLN
INTRAVENOUS | Status: AC
  Filled 2024-04-28: qty 4

## 2024-04-28 MED ORDER — FENTANYL CITRATE (PF) 100 MCG/2ML IJ SOLN
25.0000 ug | INTRAMUSCULAR | Status: DC | PRN
  Administered 2024-04-28 (×2): 50 ug via INTRAVENOUS

## 2024-04-28 MED ORDER — 0.9 % SODIUM CHLORIDE (POUR BTL) OPTIME
TOPICAL | Status: DC | PRN
  Administered 2024-04-28: 1000 mL

## 2024-04-28 MED ORDER — PHENYLEPHRINE HCL-NACL 20-0.9 MG/250ML-% IV SOLN
INTRAVENOUS | Status: DC | PRN
  Administered 2024-04-28: 25 ug/min via INTRAVENOUS

## 2024-04-28 MED ORDER — FENTANYL CITRATE (PF) 250 MCG/5ML IJ SOLN
INTRAMUSCULAR | Status: DC | PRN
  Administered 2024-04-28: 50 ug via INTRAVENOUS

## 2024-04-28 MED ORDER — MIDAZOLAM HCL 2 MG/2ML IJ SOLN
INTRAMUSCULAR | Status: AC
  Filled 2024-04-28: qty 2

## 2024-04-28 MED ORDER — HEMOSTATIC AGENTS (NO CHARGE) OPTIME
TOPICAL | Status: DC | PRN
  Administered 2024-04-28: 1 via TOPICAL

## 2024-04-28 MED ORDER — PROPOFOL 10 MG/ML IV BOLUS
INTRAVENOUS | Status: DC | PRN
  Administered 2024-04-28: 200 mg via INTRAVENOUS

## 2024-04-28 MED ORDER — EPINEPHRINE 0.1 % (1MG/ML) IJ FOR NASAL USE
INTRAMUSCULAR | Status: DC | PRN
  Administered 2024-04-28: 30 [drp] via NASAL

## 2024-04-28 MED ORDER — OXYCODONE HCL 5 MG PO TABS: 5.0000 mg | ORAL_TABLET | Freq: Once | ORAL | Status: DC | PRN

## 2024-04-28 SURGICAL SUPPLY — 48 items
BAG COUNTER SPONGE SURGICOUNT (BAG) ×1 IMPLANT
BLADE INF TURB ROT M4 2 5PK (BLADE) ×1 IMPLANT
BLADE NAVIG QUADCUT 4.3X13 M4 (BLADE) ×1 IMPLANT
BLADE RAD40 ROTATE 4M 4 5PK (BLADE) IMPLANT
BLADE RAD60 ROTATE M4 4 5PK (BLADE) IMPLANT
CANISTER SUC SOCK COL 7 IN (MISCELLANEOUS) IMPLANT
CANISTER SUCTION 3000ML PPV (SUCTIONS) ×1 IMPLANT
COAGULATOR SUCT 8FR VV (MISCELLANEOUS) IMPLANT
COAGULATOR SUCT SWTCH 10FR 6 (ELECTROSURGICAL) IMPLANT
COVER LIGHT HANDLE STERIS (MISCELLANEOUS) IMPLANT
DRAPE HALF SHEET 40X57 (DRAPES) IMPLANT
DRESSING MEROCEL 8CM (GAUZE/BANDAGES/DRESSINGS) IMPLANT
DRESSING NASAL POPE 10X1.5X2.5 (GAUZE/BANDAGES/DRESSINGS) IMPLANT
DRSG NASOPORE 8CM (GAUZE/BANDAGES/DRESSINGS) IMPLANT
DRSG TELFA 3X8 NADH STRL (GAUZE/BANDAGES/DRESSINGS) IMPLANT
ELECTRODE REM PT RTRN 9FT ADLT (ELECTROSURGICAL) IMPLANT
GAUZE SPONGE 2X2 8PLY STRL LF (GAUZE/BANDAGES/DRESSINGS) ×1 IMPLANT
GLOVE BIO SURGEON STRL SZ 6.5 (GLOVE) ×2 IMPLANT
GLOVE BIO SURGEON STRL SZ7.5 (GLOVE) ×1 IMPLANT
GOWN STRL REUS W/ TWL LRG LVL3 (GOWN DISPOSABLE) ×2 IMPLANT
KIT BASIN OR (CUSTOM PROCEDURE TRAY) ×1 IMPLANT
KIT TURNOVER KIT B (KITS) ×1 IMPLANT
NEEDLE HYPO 25GX1X1/2 BEV (NEEDLE) IMPLANT
NEEDLE PRECISIONGLIDE 27X1.5 (NEEDLE) ×1 IMPLANT
NEEDLE SPNL 25GX3.5 QUINCKE BL (NEEDLE) ×1 IMPLANT
PAD ARMBOARD POSITIONER FOAM (MISCELLANEOUS) ×2 IMPLANT
PATTIES SURGICAL .5 X3 (DISPOSABLE) ×1 IMPLANT
POSITIONER HEAD DONUT 9IN (MISCELLANEOUS) IMPLANT
POWDER SURGICEL 3.0 GRAM (HEMOSTASIS) IMPLANT
SET IV EXT TUBING 30 (IV SETS) IMPLANT
SHEATH ENDOSCRUB 0 DEG (SHEATH) IMPLANT
SHEATH ENDOSCRUB 30 DEG (SHEATH) IMPLANT
SHEATH ENDOSCRUB 45 DEG (SHEATH) IMPLANT
SOLN 0.9% NACL POUR BTL 1000ML (IV SOLUTION) ×1 IMPLANT
SOLN STERILE WATER BTL 1000 ML (IV SOLUTION) ×1 IMPLANT
SOLUTION ANTFG W/FOAM PAD STRL (MISCELLANEOUS) ×1 IMPLANT
SPLINT NASAL DOYLE BI-VL (GAUZE/BANDAGES/DRESSINGS) IMPLANT
SPLINT NASAL POSISEP X2 .8X2.3 (GAUZE/BANDAGES/DRESSINGS) IMPLANT
SUT ETHILON 3 0 PS 1 (SUTURE) IMPLANT
SUT PLAIN 4 0 ~~LOC~~ 1 (SUTURE) IMPLANT
SWAB COLLECTION DEVICE MRSA (MISCELLANEOUS) IMPLANT
SYR 50ML SLIP (SYRINGE) IMPLANT
TOWEL GREEN STERILE FF (TOWEL DISPOSABLE) ×1 IMPLANT
TRACKER ENT INSTRUMENT (MISCELLANEOUS) IMPLANT
TRACKER ENT PATIENT (MISCELLANEOUS) IMPLANT
TRAY ENT MC OR (CUSTOM PROCEDURE TRAY) ×1 IMPLANT
TUBE CONNECTING 12X1/4 (SUCTIONS) ×1 IMPLANT
TUBE SALEM SUMP 16F (TUBING) ×1 IMPLANT

## 2024-04-28 NOTE — Transfer of Care (Signed)
 Immediate Anesthesia Transfer of Care Note  Patient: Eric Hooper  Procedure(s) Performed: SINUS SURGERY, ENDOSCOPIC (Bilateral) REDUCTION, NASAL TURBINATE (Bilateral)  Patient Location: PACU  Anesthesia Type:General  Level of Consciousness: awake  Airway & Oxygen Therapy: Patient Spontanous Breathing and Patient connected to face mask oxygen  Post-op Assessment: Report given to RN, Post -op Vital signs reviewed and stable, and Patient moving all extremities  Post vital signs: Reviewed and stable  Last Vitals:  Vitals Value Taken Time  BP 180/95 04/28/24 13:30  Temp    Pulse 94 04/28/24 13:33  Resp 14 04/28/24 13:30  SpO2 97 % 04/28/24 13:33  Vitals shown include unfiled device data.  Last Pain:  Vitals:   04/28/24 0839  TempSrc:   PainSc: 0-No pain         Complications: No notable events documented.

## 2024-04-28 NOTE — Anesthesia Procedure Notes (Signed)
 Procedure Name: Intubation Date/Time: 04/28/2024 11:54 AM  Performed by: Virgil Ee, CRNAPre-anesthesia Checklist: Patient identified, Patient being monitored, Timeout performed, Emergency Drugs available and Suction available Patient Re-evaluated:Patient Re-evaluated prior to induction Oxygen Delivery Method: Circle system utilized Preoxygenation: Pre-oxygenation with 100% oxygen Induction Type: IV induction Ventilation: Mask ventilation without difficulty Laryngoscope Size: Mac and 4 Grade View: Grade I Tube type: Oral Tube size: 7.0 mm Number of attempts: 1 Airway Equipment and Method: Stylet Placement Confirmation: ETT inserted through vocal cords under direct vision, positive ETCO2 and breath sounds checked- equal and bilateral Secured at: 23 cm Tube secured with: Tape Dental Injury: Teeth and Oropharynx as per pre-operative assessment

## 2024-04-28 NOTE — Progress Notes (Signed)
 Per Dr. Leonce, OK to give carvedilol  12.5mg . HR 60

## 2024-04-28 NOTE — Anesthesia Postprocedure Evaluation (Signed)
"   Anesthesia Post Note  Patient: Eric Hooper  Procedure(s) Performed: SINUS SURGERY, ENDOSCOPIC (Bilateral) REDUCTION, NASAL TURBINATE (Bilateral)     Patient location during evaluation: PACU Anesthesia Type: General Level of consciousness: awake and alert, patient cooperative and oriented Pain management: pain level controlled Vital Signs Assessment: post-procedure vital signs reviewed and stable Respiratory status: spontaneous breathing, nonlabored ventilation and respiratory function stable Cardiovascular status: blood pressure returned to baseline and stable Postop Assessment: no apparent nausea or vomiting Anesthetic complications: no   No notable events documented.  Last Vitals:  Vitals:   04/28/24 1452 04/28/24 1500  BP: (!) 178/87 (!) 168/92  Pulse: (!) 36 (!) 36  Resp: 18 19  Temp:    SpO2: 93% 96%    Last Pain:  Vitals:   04/28/24 1430  TempSrc:   PainSc: 10-Worst pain ever                 Rossy Virag,E. Zaydyn Havey      "

## 2024-04-28 NOTE — Op Note (Signed)
 OPERATIVE NOTE  Eric Hooper Date/Time of Admission: 04/28/2024  7:47 AM  CSN: [id number];[id number] Attending Provider: Llewellyn Sayres A, DO Room/Bed: MCPO/NONE DOB: Jan 14, 1976 Age: 49 y.o.   Pre-Op Diagnosis: Chronic pansinusitis Hypertrophy of inferior nasal turbinate  Post-Op Diagnosis: Chronic pansinusitis Hypertrophy of inferior nasal turbinate  Procedure: Procedures: FUNCTIONAL ENDOSCOPIC SINUS SURGERY WITH BILATERAL MAXILLARY ANTROSTOMY WITH TISSUE REMOVAL, BILATERAL TOTAL ETHMOIDECTOMY, LEFT FRONTAL RECESS EXPLORATION, BILATERAL INFERIOR TURBINATE REDUCTION WITH IMAGE GUIDANCE   Anesthesia: General  Surgeon(s): Yaslene Lindamood A Ayan Yankey, DO  Staff: Circulator: Veda Verla SAILOR, RN Relief Circulator: Bridgett Waddell PARAS, RN Relief Scrub: Bridgett Waddell PARAS, RN Scrub Person: Dyane, Amy E  Implants: * No implants in log *  Specimens: ID Type Source Tests Collected by Time Destination  1 : bilateral sinus contents Tissue PATH ENT biopsy SURGICAL PATHOLOGY Timiko Offutt A, DO 04/28/2024 1214     Complications: None  EBL: 20 ML  Condition: stable  Operative Findings:  Chronic inflammatory changes of bilateral paranasal sinuses with polypoid mucosal edema. No purulence.   Description of Operation: Once operative consent was obtained and the site and surgery were confirmed with the patient and the operating room team, the patient was brought back to the operating room and general endotracheal anesthesia was obtained. The patient was turned over to the ENT service, at which time the image-guided system was attached and noted to be in good calibration. Lidocaine  1% with 1:100,000 epinephrine  was injected into the nasal septum bilaterally, inferior turbinates bilaterally, the middle turbinates bilaterally, and the axilla between the medial turbinate and the lateral nasal wall. Afrin-soaked pledgets were placed into the nasal cavity, and the patient was prepped and  draped in sterile fashion. Attention turned to the right-sided sinonasal cavity. The middle turbinate was medialized and a ball-tipped seeker was used to slightly anterior fracture the uncinate process. Attention was then turned to the uncinate process, which was completely fractured anteriorly and then removed with a combination of backbiter and a microdebrider. A ball-tipped seeker was used to identify the natural os of the maxillary sinus, and it was widened with combination of the backbiter, the microdebrider, the olive tip suction, and the straight TruCut until it was widely patent. Polypoid tissue was removed. Utilizing image-guided suction, the medial inferior quadrant of the ethmoid bulla was entered bluntly, the walls were fractured and the bulla was removed utilizing a microdebrider. Anterior and posterior ethmoidectomy were completed in a standard fashion by first locating the anterior face of the sphenoid sinus and then following the skull base and the lamina papyracea to fully take down all ethmoid cells. This was done utilizing a combination of the straight suction, the J curette, the ball-tipped seeker, and the microdebrider. Attention was then turned to the inferior turbinate. It was outfractured and then submucous resection was performed by making an incision in the leading edge with a 15 blade, separating the mucosa from bone with a Cottle elevator and then using the micro debrider via a turbinate blade to remove bone. Epinephrine  soaked pledgets were placed in the right sinonasal cavity and attention was turned to the left side.The middle turbinate was medialized and a ball-tipped seeker was used to slightly anterior fracture the uncinate process. The frontal recess was identified using the navigated frontal recess seeker and was widened using the Hosemann. Attention was then turned to the uncinate process, which was completely fractured anteriorly and then removed with a combination of backbiter  and a microdebrider. A ball-tipped seeker was used to identify the  natural os of the maxillary sinus, and it was widened with combination of the backbiter, the microdebrider, the olive tip suction, and the straight TruCut until it was widely patent. Polypoid tissue was removed. Utilizing image-guided suction, the medial inferior quadrant of the ethmoid bulla was entered bluntly, the walls were fractured and the bulla was removed utilizing a microdebrider. Anterior and posterior ethmoidectomy were completed in a standard fashion by first locating the anterior face of the sphenoid sinus and then following the skull base and the lamina papyracea to fully take down all ethmoid cells. This was done utilizing a combination of the straight suction, the J curette, the ball-tipped seeker, and the microdebrider. Attention was then turned to the inferior turbinate. It was outfractured and then submucous resection was performed by making an incision in the leading edge with a 15 blade, separating the mucosa from bone with a Cottle elevator and then using the micro debrider via a turbinate blade to remove bone. Pledgets were removed, copious irrigation was placed in the sinonasal cavities and Posisep absorbable nasal pack was placed lateral to the middle turbinate in the axilla between it and the lateral nasal wall. An orogastric tube was placed and the stomach cavity was suctioned to reduce postoperative nausea. The patient was turned over to anesthesia service and was extubated in the operating room and transferred to the PACU in stable condition. The patient will be discharged today and followed up in the ENT clinic in 1 week for postoperative check.   Gerard DELENA Shope, DO [facility]  04/28/2024

## 2024-04-28 NOTE — Discharge Instructions (Signed)
 [facility]US [facility] (FESS) Post Operative Instructions  Office: [phone]  The Surgery Itself Endoscopic sinus surgery (with or without septoplasty and turbinate reduction) involves general anesthesia, typically for one to two hours. Patients may be sedated for several hours after surgery and may remain sleepy for the better part of the day. Nausea and vomiting are occasionally seen, and usually resolve by the evening of surgery - even without additional medications. Almost all patients can go home the day of surgery.  After Surgery  Facial pressure and fullness similar to a sinus infection/headache is normal after surgery. Breathing through your nose is also difficult due to swelling. A humidifier or vaporizer can be used in the bedroom to prevent throat pain with mouth breathing.   Bloody nasal drainage is normal after this surgery for 5-7 days, usually decreasing in volume with each day that passes. Drainage will flow from the front of the nose and down the back of the throat. Make sure you spit out blood drainage that drips down the back of your throat to prevent nausea/vomiting. You will have a nasal drip pad/sling with gauze to catch drainage from the front of your nose. The dressing may need to be changed frequently during the first 24 hours following surgery. In case of profuse nasal bleeding, you may apply ice to the bridge of the nose and pinch the nose just above the tip and hold for 10 minutes; if bleeding continues, contact the doctors office.   Frequent hot showers or saline nasal rinses (NeilMed) will help break up congestion and clear any clot or mucus that builds up within the nose after surgery. This can be started the day after surgery.   It is more comfortable to sleep with extra pillows or in a recliner for the first few days after surgery until the drainage begins to resolve.    Do not blow your nose for 2 weeks after surgery.   Avoid lifting > 10 lbs. and no  vigorous exercise for 2 weeks after surgery.   Avoid airplane travel for 2 weeks following sinus surgery; the cabin pressure changes can cause pain and swelling within the nose/sinuses.   Sense of smell and taste are often diminished for several weeks after surgery. There may be some tenderness or numbness in your upper front teeth, which is normal after surgery. You may express old clot, discolored mucus or very large nasal crusts from your nose for up to 3-4 weeks after surgery; depending on how frequently and how effectively you irrigate your nose with the saltwater spray.   You may have absorbable sutures inside of your nose after surgery that will slowly dissolve in 2-3 weeks. Be careful when clearing crusts from the nose since they may be attached to these sutures.  Medications  Pain medication can be used for pain as prescribed. Pain and pressure in the nose is expected after surgery. As the surgical site heals, pain will resolve over the course of a week. Pain medications can cause nausea, which can be prevented if you take them with food or milk.   You may be given an antibiotic for one week after surgery to prevent infection. Take this medication with food to prevent nausea or vomiting.   You can use 2 nasal sprays after surgery: Afrin can be used up to 2 times a day for up to 5 days after surgery (best before bed) to reduce bloody drainage from the nose for the first few days after surgery. Saline/salt  water spray should be used at least 4-6 times per day, starting the day after surgery to prevent crusting inside of the nose.   Take all of your routine medications as prescribed, unless told otherwise by your surgeon. Any medications that thin the blood should be avoided. This includes aspirin. Avoid aspirin-like products for the first 72 hours after surgery (Advil, Motrin, Excedrin, Alieve, Celebrex, Naprosyn), but you may use them as needed for pain after 72 hours.

## 2024-04-28 NOTE — H&P (Signed)
 Eric Hooper is an 49 y.o. male.    Chief Complaint:  Chronic sinusitis  HPI: Patient presents today for planned elective procedure.  He denies any interval change in history since office visit on 01/24/24:  Eric Hooper is a 49 y.o. male who presents as a return patient for posttreatment CT scan and discussion of results. Patient has completed prescribed medications, denies any significant improvement in his symptoms.  Last visit 01/12/2024: Eric Hooper is a 49 y.o. male who presents as a new consult, referred by Iva Marty CROME, MD, for evaluation and treatment of sinus disease.  He reports daily nasal congestion, which intensifies when he lies down, making breathing difficult. He also experiences a sensation of pressure around his head and occasional hearing loss in both ears due to a cloudy feeling. These symptoms have been present for some time but have worsened over the past 6 months. He has no history of nasal trauma or surgery. A CT scan of his face and sinuses was performed at [facility] 2 weeks ago. He has been on antibiotics since the end of 11/2023, prescribed by his allergist for a diagnosed sinus disease, but reports no improvement in his symptoms. He was previously prescribed Flonase  by his family doctor, but this was replaced with Ryaltris  by his allergist about 5 to 6 months ago, which also did not alleviate his symptoms. He has undergone allergy  testing and was found to be allergic to grass, ragweed, weeds and dog.   He has a history of heartburn, but it has not occurred recently. His symptoms worsened after moving to [place], although he had minor allergy  issues in [place] . 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 He states that this caused skin breakouts and did not improve his nasal breathing. He continues to take Nexium . He has made dietary changes due to prediabetes, avoiding  fried foods, tomato sauce, sodas, and alcohol, and only consuming salads and baked foods. He does not smoke cigarettes. He reports no migraine symptoms, visual disturbances, light sensitivity, sound sensitivity, or headaches. He occasionally feels pressure in his ear when sitting or lying in certain positions. He can hear normally but experiences significant pressure. He sometimes struggles to swallow while eating but does not frequently clear his throat.   Past Medical History:  Diagnosis Date   At risk for dysfunction of heart 07/11/2018   Blood dyscrasia    Cancer (HCC)    Chest wall pain 08/14/2018   Controlled diabetes mellitus type 2 with complications (HCC) 02/25/2018   Diabetes mellitus (HCC) 02/01/2017   Diabetes mellitus without complication (HCC)    non-insulin  dependent   Diverticulosis 02/01/2017   on CT scan abd/pelvis   Eczema    GERD (gastroesophageal reflux disease)    Hypertension    Kidney stones 02/03/2017   Leg swelling 05/12/2017   Macrocytic anemia 02/01/2017   Microscopic hematuria 02/01/2017   Mixed hyperlipidemia 02/28/2018   Phlebitis 05/12/2017   Recurrent upper respiratory infection (URI)    Renal insufficiency 02/03/2017   Smoker 04/13/2017    Past Surgical History:  Procedure Laterality Date   COLONOSCOPY     ELBOW SURGERY Right    ESOPHAGEAL MANOMETRY N/A 09/22/2023   Procedure: MANOMETRY, ESOPHAGUS;  Surgeon: Legrand Victory CROME DOUGLAS, MD;  Location: [facility];  Service: Gastroenterology;  Laterality: N/A;   GANGLION CYST EXCISION Left    IR FLUORO GUIDE CV LINE RIGHT  02/02/2017   IR FLUORO GUIDE CV LINE RIGHT  11/23/2021   IR FLUORO GUIDE CV LINE RIGHT  11/28/2021   IR REMOVAL TUN CV CATH W/O FL  02/26/2022   IR US  GUIDE VASC ACCESS RIGHT  02/02/2017   IR US  GUIDE VASC ACCESS RIGHT  11/23/2021   PH IMPEDANCE STUDY N/A 09/22/2023   Procedure: IMPEDANCE PH STUDY, ESOPHAGUS;  Surgeon: Legrand Victory LITTIE DOUGLAS, MD;  Location: [facility];  Service:  Gastroenterology;  Laterality: N/A;   UPPER GASTROINTESTINAL ENDOSCOPY      Family History  Problem Relation Age of Onset   Allergic rhinitis Mother    Hypertension Mother    Hyperlipidemia Mother    Allergic rhinitis Father    Diabetes type II Father    Colon cancer Maternal Aunt    Esophageal cancer Neg Hx    Rectal cancer Neg Hx    Stomach cancer Neg Hx     Social History:  reports that he quit smoking about 4 months ago. His smoking use included cigarettes. He has a 7.5 pack-year smoking history. He has been exposed to tobacco smoke. He has never used smokeless tobacco. He reports that he does not currently use alcohol. He reports that he does not use drugs.  Allergies: Allergies[1]  Medications Prior to Admission  Medication Sig Dispense Refill   amLODipine  (NORVASC ) 10 MG tablet TAKE 1 TABLET BY MOUTH EVERYDAY AT BEDTIME (Patient taking differently: Take 10 mg by mouth daily.) 90 tablet 3   aspirin  EC 81 MG tablet Take 1 tablet (81 mg total) by mouth daily. Swallow whole.     atorvastatin  (LIPITOR) 40 MG tablet TAKE 1 TABLET BY MOUTH EVERY DAY 90 tablet 1   carvedilol  (COREG ) 12.5 MG tablet Take 1 tablet (12.5 mg total) by mouth daily. 90 tablet 3   clindamycin  (CLEOCIN -T) 1 % lotion Apply topically daily. (Patient taking differently: Apply 1 Application topically daily as needed.) 60 mL 5   empagliflozin  (JARDIANCE ) 10 MG TABS tablet Take 1 tablet (10 mg total) by mouth daily. 90 tablet 3   eplerenone  (INSPRA ) 25 MG tablet TAKE 1 TABLET BY MOUTH EVERYDAY AT BEDTIME (Patient taking differently: Take 25 mg by mouth daily.) 90 tablet 0   esomeprazole  (NEXIUM ) 40 MG capsule TAKE 1 CAPSULE (40 MG TOTAL) BY MOUTH 2 (TWO) TIMES DAILY BEFORE A MEAL. (Patient taking differently: Take 40 mg by mouth daily as needed.) 180 capsule 1   losartan  (COZAAR ) 100 MG tablet TAKE 1 TABLET BY MOUTH EVERYDAY AT BEDTIME 90 tablet 1   acetaminophen  (TYLENOL ) 325 MG tablet Take 2 tablets (650 mg total)  by mouth every 4 (four) hours as needed (discomfort or fever). (Patient not taking: Reported on 03/10/2024) 20 tablet 0   blood glucose meter kit and supplies Dispense based on patient and insurance preference. Use up to four times daily as directed. (FOR ICD-10 E10.9, E11.9). 1 each 0   EPINEPHrine  0.3 mg/0.3 mL IJ SOAJ injection Inject 0.3 mg into the muscle as needed for anaphylaxis. (Patient not taking: No sig reported) 1 each 0   polyethylene glycol powder (MIRALAX ) 17 GM/SCOOP powder Take 17 g by mouth daily as needed (constipation). (Patient not taking: Reported on 04/21/2024) 255 g 0   senna (SENOKOT) 8.6 MG TABS tablet Take 1 tablet (8.6 mg total) by mouth daily. (Patient not taking: Reported on 03/10/2024) 120 tablet 0   Tazarotene  0.1 % FOAM Apply 1 Application topically every other day. (Patient not taking: Reported on 04/21/2024)  100 g 6   tretinoin  (RETIN-A ) 0.05 % cream Apply 1 application  topically at bedtime. (Patient not taking: Reported on 04/21/2024)      Results for orders placed or performed during the hospital encounter of 04/28/24 (from the past 48 hours)  Glucose, capillary     Status: Abnormal   Collection Time: 04/28/24  8:27 AM  Result Value Ref Range   Glucose-Capillary 105 (H) 70 - 99 mg/dL    Comment: Glucose reference range applies only to samples taken after fasting for at least 8 hours.   No results found.  ROS: ROS  Blood pressure 136/87, pulse 60, temperature 98.2 F (36.8 C), temperature source Oral, resp. rate 18, height 5' 10 (1.778 m), weight 79.8 kg, SpO2 98%.  PHYSICAL EXAM: Physical Exam Constitutional:      Appearance: Normal appearance.  Pulmonary:     Effort: Pulmonary effort is normal.  Neurological:     General: No focal deficit present.     Mental Status: He is alert.  Psychiatric:        Mood and Affect: Mood normal.     Studies Reviewed: CT sinus demonstrates partial opacification of bilateral maxillary, ethmoid and left  frontal air cells, with obstruction of bilateral ostiomeatal complexes and left frontoethmoidal recess. Sphenoid sinuses appear well-pneumatized. Absent right frontal sinus. Relatively midline nasal septum with small amount of bony spurring on the right. Bilateral inferior turbinate hypertrophy    Assessment/Plan Eric Hooper is a 49 y.o. male with chronic sinusitis.  To OR for functional endoscopic sinus surgery with bilateral maxillary antrostomy, total ethmoidectomy, left frontal recess exploration and bilateral inferior turbinate reduction under image guidance. Risks, recovery reviewed. All questions answered. Patient willingly consented to planned surgery.     Eloni Darius A Rithika Seel 04/28/2024, 10:08 AM       [1]  Allergies Allergen Reactions   Bee Venom Anaphylaxis   Ibuprofen Other (See Comments)    Drops platelets/ thrombocytopenia

## 2024-04-29 ENCOUNTER — Encounter (HOSPITAL_COMMUNITY): Payer: Self-pay | Admitting: Otolaryngology

## 2024-05-01 LAB — SURGICAL PATHOLOGY

## 2024-05-12 ENCOUNTER — Telehealth: Payer: Self-pay | Admitting: Cardiology

## 2024-05-12 NOTE — Telephone Encounter (Signed)
 Pt would like a c/b regarding pulse, please advise.

## 2024-05-12 NOTE — Telephone Encounter (Signed)
 Spoke with pt. Pt states at a appointment today HR was 34. Nurses checked twice at HR 34. Stated that Dr felt by hand as 59. Pt states this is the 3rd time this has happened. Pt denies all symptoms stating he feels great. Advised pt that his PVCs may be throwing off the reading but that I would send over to Dr Sheena for review. 911/ED precautions given. Pt stated understanding. Pt has appt 06/01/24 with Dr Sheena.

## 2024-05-13 ENCOUNTER — Other Ambulatory Visit: Payer: Self-pay

## 2024-05-15 ENCOUNTER — Telehealth: Payer: Self-pay | Admitting: Cardiology

## 2024-05-15 NOTE — Telephone Encounter (Signed)
 Patient calling in about an heart monitor that he is suppose to war again. Please advise

## 2024-05-15 NOTE — Telephone Encounter (Signed)
 Pt inquiring about wearing his 7 day heart monitor after his sinus surgery. Stated he was told to wait from surgeon, but has been cleared. Given instructions on how to put monitor on and to avoid soaking or getting it wet first 24 hours. Pt has skin sensitive electrodes. Pt stated he was told at his doctor that his HR was running low. When doctor checked manually, HR was normal. Asymptomatic. Pt stated he was having trouble with high blood pressure machine at home that he just bought. Advised to bring this with him to next visit. Pt verbalizes understanding of plan.

## 2024-05-19 ENCOUNTER — Other Ambulatory Visit: Payer: Self-pay | Admitting: Family Medicine

## 2024-05-19 ENCOUNTER — Other Ambulatory Visit: Payer: Self-pay

## 2024-05-19 DIAGNOSIS — I152 Hypertension secondary to endocrine disorders: Secondary | ICD-10-CM

## 2024-05-19 DIAGNOSIS — I10 Essential (primary) hypertension: Secondary | ICD-10-CM

## 2024-05-19 MED ORDER — EPLERENONE 25 MG PO TABS: 25.0000 mg | ORAL_TABLET | Freq: Every day | ORAL | 0 refills | Status: AC

## 2024-05-26 ENCOUNTER — Telehealth: Payer: Self-pay | Admitting: Cardiology

## 2024-05-26 NOTE — Telephone Encounter (Signed)
 Spoke with patient about changing location of monitor strip/ electrodes.  He can also take a break for a couple days and allow his skin to heal.  Put recorder in charger to turn off and be sure to turn it back on when reapplying.  Will leave a bridge and 33m repositionable electrodes and also a couple regular strips at zone A check in desk for patient to pick up.

## 2024-05-26 NOTE — Telephone Encounter (Signed)
 Pt states he's breaking out from wearing the patches provided with his Zio  monitor.

## 2024-05-29 ENCOUNTER — Ambulatory Visit: Admitting: Endocrinology

## 2024-06-01 ENCOUNTER — Ambulatory Visit: Admitting: Cardiology

## 2024-06-01 ENCOUNTER — Encounter: Payer: Self-pay | Admitting: Cardiology

## 2024-06-01 VITALS — BP 132/88 | HR 84 | Ht 70.0 in | Wt 173.2 lb

## 2024-06-01 DIAGNOSIS — R931 Abnormal findings on diagnostic imaging of heart and coronary circulation: Secondary | ICD-10-CM

## 2024-06-01 DIAGNOSIS — Z79899 Other long term (current) drug therapy: Secondary | ICD-10-CM

## 2024-06-01 LAB — COMPREHENSIVE METABOLIC PANEL WITH GFR
ALT: 31 [IU]/L (ref 0–44)
AST: 19 [IU]/L (ref 0–40)
Albumin: 4.3 g/dL (ref 4.1–5.1)
Alkaline Phosphatase: 143 [IU]/L — ABNORMAL HIGH (ref 47–123)
BUN/Creatinine Ratio: 13 (ref 9–20)
BUN: 16 mg/dL (ref 6–24)
Bilirubin Total: 1.4 mg/dL — ABNORMAL HIGH (ref 0.0–1.2)
CO2: 23 mmol/L (ref 20–29)
Calcium: 10.1 mg/dL (ref 8.7–10.2)
Chloride: 106 mmol/L (ref 96–106)
Creatinine, Ser: 1.22 mg/dL (ref 0.76–1.27)
Globulin, Total: 2.4 g/dL (ref 1.5–4.5)
Glucose: 112 mg/dL — ABNORMAL HIGH (ref 70–99)
Potassium: 4.5 mmol/L (ref 3.5–5.2)
Sodium: 143 mmol/L (ref 134–144)
Total Protein: 6.7 g/dL (ref 6.0–8.5)
eGFR: 72 mL/min/{1.73_m2}

## 2024-06-01 LAB — MAGNESIUM: Magnesium: 2.3 mg/dL (ref 1.6–2.3)

## 2024-06-01 NOTE — Patient Instructions (Signed)
 Medication Instructions:  Your physician recommends that you continue on your current medications as directed. Please refer to the Current Medication list given to you today.  *If you need a refill on your cardiac medications before your next appointment, please call your pharmacy*  Lab Work: CMET, Mag If you have labs (blood work) drawn today and your tests are completely normal, you will receive your results only by: MyChart Message (if you have MyChart) OR A paper copy in the mail If you have any lab test that is abnormal or we need to change your treatment, we will call you to review the results.  Testing/Procedures: Your physician has requested that you have an echocardiogram. Echocardiography is a painless test that uses sound waves to create images of your heart. It provides your doctor with information about the size and shape of your heart and how well your hearts chambers and valves are working. This procedure takes approximately one hour. There are no restrictions for this procedure. Please do NOT wear cologne, perfume, aftershave, or lotions (deodorant is allowed). Please arrive 15 minutes prior to your appointment time.  Please note: We ask at that you not bring children with you during ultrasound (echo/ vascular) testing. Due to room size and safety concerns, children are not allowed in the ultrasound rooms during exams. Our front office staff cannot provide observation of children in our lobby area while testing is being conducted. An adult accompanying a patient to their appointment will only be allowed in the ultrasound room at the discretion of the ultrasound technician under special circumstances. We apologize for any inconvenience.   Follow-Up: At [facility], you and your health needs are our priority.  As part of our continuing mission to provide you with exceptional heart care, our providers are all part of one team.  This team includes your primary Cardiologist  (physician) and Advanced Practice Providers or APPs (Physician Assistants and Nurse Practitioners) who all work together to provide you with the care you need, when you need it.  Your next appointment:   16 week(s)  Provider:   Kardie Tobb, DO

## 2024-06-02 ENCOUNTER — Ambulatory Visit: Payer: Self-pay | Admitting: Endocrinology

## 2024-06-02 ENCOUNTER — Ambulatory Visit: Admitting: Endocrinology

## 2024-06-02 ENCOUNTER — Encounter: Payer: Self-pay | Admitting: Endocrinology

## 2024-06-02 ENCOUNTER — Other Ambulatory Visit

## 2024-06-02 VITALS — BP 138/72 | HR 76 | Ht 70.0 in | Wt 172.2 lb

## 2024-06-02 DIAGNOSIS — E782 Mixed hyperlipidemia: Secondary | ICD-10-CM

## 2024-06-02 DIAGNOSIS — E119 Type 2 diabetes mellitus without complications: Secondary | ICD-10-CM

## 2024-06-02 LAB — POCT GLYCOSYLATED HEMOGLOBIN (HGB A1C): Hemoglobin A1C: 5.5 % (ref 4.0–5.6)

## 2024-06-02 MED ORDER — EMPAGLIFLOZIN 10 MG PO TABS: 10.0000 mg | ORAL_TABLET | Freq: Every day | ORAL | 3 refills | Status: AC

## 2024-06-02 NOTE — Progress Notes (Addendum)
 "  Outpatient Endocrinology Note Eric Kendle, MD  06/02/24  Patient's Name: Eric Hooper    DOB: 29-Oct-1975    MRN: [id number]                                                    REASON OF VISIT: Follow-up of type 2 diabetes mellitus  PCP: Toma, Dimitry, MD  HISTORY OF PRESENT ILLNESS:   Eric Hooper is a 49 y.o. old male with past medical history listed below, is here for follow-up for type 2 diabetes mellitus.   Pertinent Diabetes History: _Diagnosed as Diabetes Mellitus type 2 in 2018.  Patient was initially treated with metformin .  Metformin  was switched to SGLT2 inhibitor in February 2024.  Patient reports he was also having sickness with metformin .  Patient had taken glipizide  and insulin  therapy during steroid treatment for his TTP in the past.  He has a relatively controlled type 2 diabetes mellitus.  Patient was referred to endocrinology for evaluation and management of known type 2 diabetes mellitus, initial consult was in November 2024.  History of DKA or diabetes related hospitalizations: none  Family h/o diabetes mellitus: father type 2 diabetes mellitus.    Chronic Diabetes Complications : Retinopathy: no. Last ophthalmology exam was done on annually, following with ophthalmology regularly.  Nephropathy: no, on ACE/ARB /losartan . Peripheral neuropathy: no Coronary artery disease: no Stroke: no  Relevant comorbidities and cardiovascular risk factors: Obesity: no Body mass index is 24.71 kg/m.  Hypertension: Yes  Hyperlipidemia : Yes, on statin   Current / Home Diabetic regimen includes:  Jardiance  10 mg daily.  Prior diabetic medications: Metformin  nausea and vomiting.  Was switched to Farxiga  in February 2024.  He had been on Farxiga  in the past.  He had been on insulin  therapy during his steroid treatment for his TTP.  He had hypoglycemia with Jardiance  of 25 mg.  Glycemic data:   Patient reports glucose 80 - low 100 range.  Hypoglycemia: Patient has no  hypoglycemic episodes. Patient has hypoglycemia awareness.  Factors modifying glucose control: 1.  Diabetic diet assessment: 3 meals a day.  He has made improvement on diet.  Eating meat, fish and limited carbohydrates.  2.  Staying active or exercising: Physically active at work.  3.  Medication compliance: compliant all of the time.  Interval history  Hemoglobin A1c 5.5% today.  He has been on Jardiance .  Acceptable blood sugar on glucometer.  He denies numbness and ting of the feet.  No vision problem. Patient laboratory results reviewed.  Normal renal function.  Normal liver enzymes.  Noted occasional mild elevation of bilirubin, ?  Gilbert's syndrome.  Patient is concerned about elevated bilirubin.  Patient had seen GI, advised to discuss.  No other complaints today.  REVIEW OF SYSTEMS As per history of present illness.   PAST MEDICAL HISTORY: Past Medical History:  Diagnosis Date   At risk for dysfunction of heart 07/11/2018   Blood dyscrasia    Cancer (HCC)    Chest wall pain 08/14/2018   Controlled diabetes mellitus type 2 with complications (HCC) 02/25/2018   Diabetes mellitus (HCC) 02/01/2017   Diabetes mellitus without complication (HCC)    non-insulin  dependent   Diverticulosis 02/01/2017   on CT scan abd/pelvis   Eczema    GERD (gastroesophageal reflux disease)    Hypertension  Kidney stones 02/03/2017   Leg swelling 05/12/2017   Macrocytic anemia 02/01/2017   Microscopic hematuria 02/01/2017   Mixed hyperlipidemia 02/28/2018   Phlebitis 05/12/2017   Recurrent upper respiratory infection (URI)    Renal insufficiency 02/03/2017   Smoker 04/13/2017    PAST SURGICAL HISTORY: Past Surgical History:  Procedure Laterality Date   COLONOSCOPY     ELBOW SURGERY Right    ESOPHAGEAL MANOMETRY N/A 09/22/2023   Procedure: MANOMETRY, ESOPHAGUS;  Surgeon: Legrand Victory LITTIE DOUGLAS, MD;  Location: [facility];  Service: Gastroenterology;  Laterality: N/A;   GANGLION CYST  EXCISION Left    IR FLUORO GUIDE CV LINE RIGHT  02/02/2017   IR FLUORO GUIDE CV LINE RIGHT  11/23/2021   IR FLUORO GUIDE CV LINE RIGHT  11/28/2021   IR REMOVAL TUN CV CATH W/O FL  02/26/2022   IR US  GUIDE VASC ACCESS RIGHT  02/02/2017   IR US  GUIDE VASC ACCESS RIGHT  11/23/2021   NASAL SINUS SURGERY Bilateral 04/28/2024   Procedure: SINUS SURGERY, ENDOSCOPIC;  Surgeon: Llewellyn, Meghan A, DO;  Location: [facility];  Service: ENT;  Laterality: Bilateral;  FUNCTIONAL ENDOSCOPIC SINUS SURGERY WITH BILATERAL MAXILLARY ANTROSTOMY, TOTAL ETHMOIDECTOMY, LEFT FRONTAL RECESS EXPLORATION AND BILATERAL INFERIOR TURBINATE REDUCTION UNDER IMAGE GUIDANCE.   PH IMPEDANCE STUDY N/A 09/22/2023   Procedure: IMPEDANCE PH STUDY, ESOPHAGUS;  Surgeon: Legrand Victory LITTIE DOUGLAS, MD;  Location: [facility];  Service: Gastroenterology;  Laterality: N/A;   TURBINATE REDUCTION Bilateral 04/28/2024   Procedure: REDUCTION, NASAL TURBINATE;  Surgeon: Llewellyn, Meghan A, DO;  Location: [facility];  Service: ENT;  Laterality: Bilateral;   UPPER GASTROINTESTINAL ENDOSCOPY      ALLERGIES: Allergies  Allergen Reactions   Bee Venom Anaphylaxis   Ibuprofen Other (See Comments)    Drops platelets/ thrombocytopenia    FAMILY HISTORY:  Family History  Problem Relation Age of Onset   Allergic rhinitis Mother    Hypertension Mother    Hyperlipidemia Mother    Allergic rhinitis Father    Diabetes type II Father    Colon cancer Maternal Aunt    Esophageal cancer Neg Hx    Rectal cancer Neg Hx    Stomach cancer Neg Hx     SOCIAL HISTORY: Social History   Socioeconomic History   Marital status: Single    Spouse name: Not on file   Number of children: 2   Years of education: Not on file   Highest education level: 12th grade  Occupational History   Occupation: location manager   Occupation: unemployed  Tobacco Use   Smoking status: Former    Current packs/day: 0.00    Average packs/day: 0.5 packs/day for 15.0 years (7.5 ttl  pk-yrs)    Types: Cigarettes    Quit date: 12/2023    Years since quitting: 0.4    Passive exposure: Current   Smokeless tobacco: Never  Vaping Use   Vaping status: Never Used  Substance and Sexual Activity   Alcohol use: Not Currently   Drug use: No   Sexual activity: Yes  Other Topics Concern   Not on file  Social History Narrative   ** Merged History Encounter **       Social Drivers of Health   Tobacco Use: Medium Risk (06/02/2024)   Patient History    Smoking Tobacco Use: Former    Smokeless Tobacco Use: Never    Passive Exposure: Current  Physicist, Medical Strain: Low Risk (03/17/2024)   Overall Financial Resource Strain (CARDIA)  Difficulty of Paying Living Expenses: Not very hard  Food Insecurity: Food Insecurity Present (03/17/2024)   Epic    Worried About Programme Researcher, Broadcasting/film/video in the Last Year: Sometimes true    Ran Out of Food in the Last Year: Sometimes true  Transportation Needs: No Transportation Needs (03/17/2024)   Epic    Lack of Transportation (Medical): No    Lack of Transportation (Non-Medical): No  Physical Activity: Insufficiently Active (03/17/2024)   Exercise Vital Sign    Days of Exercise per Week: 3 days    Minutes of Exercise per Session: 20 min  Stress: Stress Concern Present (03/17/2024)   [employer] of Occupational Health - Occupational Stress Questionnaire    Feeling of Stress: To some extent  Social Connections: Moderately Integrated (03/17/2024)   Social Connection and Isolation Panel    Frequency of Communication with Friends and Family: More than three times a week    Frequency of Social Gatherings with Friends and Family: More than three times a week    Attends Religious Services: 1 to 4 times per year    Active Member of [employer] or Organizations: No    Attends Banker Meetings: Not on file    Marital Status: Married  Depression (PHQ2-9): Low Risk (04/18/2024)   Depression (PHQ2-9)    PHQ-2 Score: 0  Alcohol  Screen: Not on file  Housing: Low Risk (03/17/2024)   Epic    Unable to Pay for Housing in the Last Year: No    Number of Times Moved in the Last Year: 0    Homeless in the Last Year: No  Utilities: Not At Risk (12/13/2023)   [facility]    In the past 12 months has the electric, gas, oil, or water company threatened to shut off services in your home?: No  Health Literacy: Not on file    MEDICATIONS:  Current Outpatient Medications  Medication Sig Dispense Refill   amLODipine  (NORVASC ) 10 MG tablet TAKE 1 TABLET BY MOUTH EVERYDAY AT BEDTIME (Patient taking differently: Take 10 mg by mouth daily.) 90 tablet 3   aspirin  EC 81 MG tablet Take 1 tablet (81 mg total) by mouth daily. Swallow whole.     atorvastatin  (LIPITOR) 40 MG tablet TAKE 1 TABLET BY MOUTH EVERY DAY 90 tablet 1   blood glucose meter kit and supplies Dispense based on patient and insurance preference. Use up to four times daily as directed. (FOR ICD-10 E10.9, E11.9). 1 each 0   carvedilol  (COREG ) 12.5 MG tablet Take 1 tablet (12.5 mg total) by mouth daily. 90 tablet 3   clindamycin  (CLEOCIN -T) 1 % lotion Apply topically daily. (Patient taking differently: Apply 1 Application topically daily as needed.) 60 mL 5   EPINEPHrine  0.3 mg/0.3 mL IJ SOAJ injection Inject 0.3 mg into the muscle as needed for anaphylaxis. 1 each 0   eplerenone  (INSPRA ) 25 MG tablet Take 1 tablet (25 mg total) by mouth daily. TAKE 1 TABLET BY MOUTH EVERYDAY AT BEDTIME. 90 tablet 0   esomeprazole  (NEXIUM ) 40 MG capsule TAKE 1 CAPSULE (40 MG TOTAL) BY MOUTH 2 (TWO) TIMES DAILY BEFORE A MEAL. (Patient taking differently: Take 40 mg by mouth daily as needed.) 180 capsule 1   losartan  (COZAAR ) 100 MG tablet TAKE 1 TABLET BY MOUTH EVERYDAY AT BEDTIME 90 tablet 1   tretinoin  (RETIN-A ) 0.05 % cream Apply 1 application  topically at bedtime.     acetaminophen  (TYLENOL ) 325 MG tablet  Take 2 tablets (650 mg total) by mouth every 4 (four) hours  as needed (discomfort or fever). (Patient not taking: Reported on 06/01/2024) 20 tablet 0   empagliflozin  (JARDIANCE ) 10 MG TABS tablet Take 1 tablet (10 mg total) by mouth daily. 90 tablet 3   polyethylene glycol powder (MIRALAX ) 17 GM/SCOOP powder Take 17 g by mouth daily as needed (constipation). (Patient not taking: Reported on 06/01/2024) 255 g 0   senna (SENOKOT) 8.6 MG TABS tablet Take 1 tablet (8.6 mg total) by mouth daily. (Patient not taking: Reported on 06/01/2024) 120 tablet 0   Tazarotene  0.1 % FOAM Apply 1 Application topically every other day. (Patient not taking: Reported on 06/01/2024) 100 g 6   No current facility-administered medications for this visit.    PHYSICAL EXAM: Vitals:   06/02/24 1306  BP: 138/72  Pulse: 76  SpO2: 98%  Weight: 172 lb 3.2 oz (78.1 kg)  Height: 5' 10 (1.778 m)    Body mass index is 24.71 kg/m.  Wt Readings from Last 3 Encounters:  06/02/24 172 lb 3.2 oz (78.1 kg)  06/01/24 173 lb 3.2 oz (78.6 kg)  04/28/24 176 lb (79.8 kg)    General: Well developed, well nourished male in no apparent distress.  HEENT: AT/[place], no external lesions.  Eyes: Conjunctiva clear and no icterus. Neck: Neck supple  Lungs: Respirations not labored Neurologic: Alert, oriented, normal speech Extremities / Skin: Dry.   Psychiatric: Does not appear depressed or anxious  Diabetic Foot Exam - Simple   Simple Foot Form Diabetic Foot exam was performed with the following findings: Yes 06/02/2024  1:44 PM  Visual Inspection No deformities, no ulcerations, no other skin breakdown bilaterally: Yes Sensation Testing Intact to touch and monofilament testing bilaterally: Yes Pulse Check Posterior Tibialis and Dorsalis pulse intact bilaterally: Yes Comments     LABS Reviewed Lab Results  Component Value Date   HGBA1C 5.5 06/02/2024   HGBA1C 5.0 03/09/2024   HGBA1C 5.9 (H) 11/22/2023   No results found for: FRUCTOSAMINE Lab Results  Component Value Date   CHOL 94  (L) 05/29/2022   HDL 42 05/29/2022   LDLCALC 39 05/29/2022   TRIG 53 05/29/2022   CHOLHDL 2.2 05/29/2022   Lab Results  Component Value Date   MICRALBCREAT 18 06/28/2023   MICRALBCREAT 13 05/29/2022   Lab Results  Component Value Date   CREATININE 1.22 06/01/2024   No results found for: GFR  ASSESSMENT / PLAN  1. Type 2 diabetes mellitus without complication, without long-term current use of insulin  (HCC)   2. Mixed hyperlipidemia     Diabetes Mellitus type 2, complicated by no known complications. - Diabetic status / severity: Controlled.  Lab Results  Component Value Date   HGBA1C 5.5 06/02/2024    - Hemoglobin A1c goal : <6.5%  Patient has done quite good on diet control.  He has controlled type 2 diabetes mellitus.  - Medications: See below, no change.  I) continue Jardiance  10 mg daily.  - Home glucose testing: Few times a week at different times of the day.  - Discussed/ Gave Hypoglycemia treatment plan.  # Consult : Not required at this time.  # Annual urine for microalbuminuria/ creatinine ratio, no microalbuminuria currently, continue ACE/ARB /losartan  /Jardiance .  Will check today. Last  Lab Results  Component Value Date   MICRALBCREAT 18 06/28/2023    # Foot check nightly.  # Annual dilated diabetic eye exams.   - Diet: Make healthy diabetic food choices -  Life style / activity / exercise: Discussed.  2. Blood pressure  -  BP Readings from Last 1 Encounters:  06/02/24 138/72    - Control is in target.  - No change in current plans.    3. Lipid status / Hyperlipidemia - Last  Lab Results  Component Value Date   LDLCALC 39 05/29/2022   - Continue atorvastatin  40 mg daily.   - Check lipid panel today.  Aki was seen today for diabetes.  Diagnoses and all orders for this visit:  Type 2 diabetes mellitus without complication, without long-term current use of insulin  (HCC) -     POCT HgB A1C -     Lipid panel -      Microalbumin / creatinine urine ratio -     empagliflozin  (JARDIANCE ) 10 MG TABS tablet; Take 1 tablet (10 mg total) by mouth daily.  Mixed hyperlipidemia   DISPOSITION Follow up in clinic in 6  months suggested.   All questions answered and patient verbalized understanding of the plan.  Eric Loper, MD [facility] Endocrinology [facility] [address] [place], [address] [place], [place] [address] Phone # [phone]  At least part of this note was generated using voice recognition software. Inadvertent word errors may have occurred, which were not recognized during the proofreading process. "

## 2024-06-08 ENCOUNTER — Ambulatory Visit: Admitting: Allergy & Immunology

## 2024-06-29 ENCOUNTER — Inpatient Hospital Stay: Admitting: Oncology

## 2024-06-29 ENCOUNTER — Inpatient Hospital Stay

## 2024-07-03 ENCOUNTER — Ambulatory Visit (HOSPITAL_COMMUNITY)

## 2024-07-04 ENCOUNTER — Inpatient Hospital Stay

## 2024-07-04 ENCOUNTER — Inpatient Hospital Stay: Admitting: Oncology

## 2024-09-15 ENCOUNTER — Ambulatory Visit: Admitting: Cardiology

## 2024-11-27 ENCOUNTER — Ambulatory Visit: Admitting: Endocrinology
